# Patient Record
Sex: Female | Born: 1945 | Hispanic: No | Marital: Single | Smoking: Former smoker
Health system: Southern US, Community
[De-identification: ages and names within clinical notes are randomized; demographics above are authoritative.]

## PROBLEM LIST (undated history)

## (undated) DIAGNOSIS — H269 Unspecified cataract: Secondary | ICD-10-CM

## (undated) DIAGNOSIS — M1990 Unspecified osteoarthritis, unspecified site: Secondary | ICD-10-CM

## (undated) DIAGNOSIS — Z923 Personal history of irradiation: Secondary | ICD-10-CM

## (undated) DIAGNOSIS — I471 Supraventricular tachycardia, unspecified: Secondary | ICD-10-CM

## (undated) DIAGNOSIS — I10 Essential (primary) hypertension: Secondary | ICD-10-CM

## (undated) DIAGNOSIS — E119 Type 2 diabetes mellitus without complications: Secondary | ICD-10-CM

## (undated) DIAGNOSIS — E785 Hyperlipidemia, unspecified: Secondary | ICD-10-CM

## (undated) DIAGNOSIS — E079 Disorder of thyroid, unspecified: Secondary | ICD-10-CM

## (undated) DIAGNOSIS — K219 Gastro-esophageal reflux disease without esophagitis: Secondary | ICD-10-CM

## (undated) HISTORY — DX: Hyperlipidemia, unspecified: E78.5

## (undated) HISTORY — PX: BREAST SURGERY: SHX581

## (undated) HISTORY — PX: APPENDECTOMY: SHX54

## (undated) HISTORY — PX: KNEE SURGERY: SHX244

## (undated) HISTORY — PX: OTHER SURGICAL HISTORY: SHX169

## (undated) HISTORY — DX: Unspecified cataract: H26.9

## (undated) HISTORY — PX: EYE SURGERY: SHX253

## (undated) HISTORY — DX: Unspecified osteoarthritis, unspecified site: M19.90

## (undated) HISTORY — DX: Gastro-esophageal reflux disease without esophagitis: K21.9

---

## 2016-12-29 ENCOUNTER — Emergency Department (HOSPITAL_COMMUNITY)
Admission: EM | Admit: 2016-12-29 | Discharge: 2016-12-29 | Disposition: A | Discharge status: Home / Self Care | Payer: Medicaid Other | Attending: Emergency Medicine | Admitting: Emergency Medicine

## 2016-12-29 ENCOUNTER — Emergency Department (HOSPITAL_COMMUNITY): Payer: Medicaid Other

## 2016-12-29 ENCOUNTER — Encounter (HOSPITAL_COMMUNITY): Payer: Self-pay

## 2016-12-29 DIAGNOSIS — I471 Supraventricular tachycardia: Secondary | ICD-10-CM | POA: Diagnosis not present

## 2016-12-29 DIAGNOSIS — R000 Tachycardia, unspecified: Secondary | ICD-10-CM | POA: Diagnosis present

## 2016-12-29 HISTORY — DX: Essential (primary) hypertension: I10

## 2016-12-29 HISTORY — DX: Supraventricular tachycardia: I47.1

## 2016-12-29 HISTORY — DX: Disorder of thyroid, unspecified: E07.9

## 2016-12-29 HISTORY — DX: Supraventricular tachycardia, unspecified: I47.10

## 2016-12-29 LAB — BASIC METABOLIC PANEL
Anion gap: 11 (ref 5–15)
BUN: 13 mg/dL (ref 6–20)
CO2: 20 mmol/L — ABNORMAL LOW (ref 22–32)
Calcium: 8.8 mg/dL — ABNORMAL LOW (ref 8.9–10.3)
Chloride: 105 mmol/L (ref 101–111)
Creatinine, Ser: 0.7 mg/dL (ref 0.44–1.00)
GFR calc Af Amer: >60 mL/min (ref >60)
GFR calc non Af Amer: >60 mL/min (ref >60)
Glucose, Bld: 236 mg/dL — ABNORMAL HIGH (ref 65–99)
Potassium: 3.3 mmol/L — ABNORMAL LOW (ref 3.5–5.1)
Sodium: 136 mmol/L (ref 135–145)

## 2016-12-29 LAB — CBC
HCT: 38.4 % (ref 36.0–46.0)
Hemoglobin: 12.5 g/dL (ref 12.0–15.0)
MCH: 28.2 pg (ref 26.0–34.0)
MCHC: 32.6 g/dL (ref 30.0–36.0)
MCV: 86.7 fL (ref 78.0–100.0)
Platelets: 234 10*3/uL (ref 150–400)
RBC: 4.43 MIL/uL (ref 3.87–5.11)
RDW: 12.7 % (ref 11.5–15.5)
WBC: 8.5 10*3/uL (ref 4.0–10.5)

## 2016-12-29 LAB — TROPONIN I: Troponin I: 0.06 ng/mL (ref <0.03)

## 2016-12-29 MED ORDER — PROPRANOLOL HCL ER 60 MG PO CP24
60.0000 mg | ORAL_CAPSULE | Freq: Once | ORAL | Status: AC
  Administered 2016-12-29: 60 mg via ORAL
  Filled 2016-12-29: qty 1

## 2016-12-29 MED ORDER — PROPRANOLOL HCL ER 60 MG PO CP24: 60.0000 mg | ORAL_CAPSULE | Freq: Every day | ORAL | 0 refills | Status: DC

## 2016-12-29 MED ORDER — LEVOTHYROXINE SODIUM 50 MCG PO TABS: 50.0000 ug | ORAL_TABLET | Freq: Every day | ORAL | 0 refills | Status: DC

## 2016-12-29 MED ORDER — METOPROLOL TARTRATE 5 MG/5ML IV SOLN
5.0000 mg | Freq: Once | INTRAVENOUS | Status: AC
  Administered 2016-12-29: 5 mg via INTRAVENOUS
  Filled 2016-12-29: qty 5

## 2016-12-29 NOTE — ED Triage Notes (Signed)
Per EMS, pt had onset of central chest pressure at 1530 this afternoon. On scene pt was in SVT at 226bpm. Pt given adenosine and converted to 120-130 sinus tach. Pt states that chest pressure is less after conversion. Pt also states that she has not taken her beta blocker or thyroid meds in 2 days because of a miscommunication with her pcp. Pt in sinus tach here 125.

## 2016-12-29 NOTE — Discharge Instructions (Signed)
Return to the ER if you develop chest discomfort, fast heart rate or shortness of breath

## 2016-12-29 NOTE — ED Notes (Signed)
Patient ambulated in hallway with only stand by assist, HR remained around 115 on pulse ox and was at the same rate on cardiac monitor with only a brief moment at 127 when getting back in bed. HR quickly decreased back to 118 when movement was stopped.

## 2016-12-29 NOTE — ED Provider Notes (Signed)
[facility] DEPT Provider Note   CSN: [id number] Arrival date & time: 12/29/16  1615     History   Chief Complaint Chief Complaint  Patient presents with  . Tachycardia    HPI Bailey Walker is a 71 y.o. female.  Patient is a 71 year old female with a history of GERD, hypothyroidism and an unknown heart condition per family presenting today with palpitations, shortness of breath, feeling like throat is closing and central chest pressure that radiates to her neck. Patient states this started around 2 PM today while she was taking a shower. When EMS arrived patient was found to be in SVT. She was given adenosine with return to sinus rhythm. She states now she has only a slight chest posture no shortness of breath and feels much better. She has never had anything like this before. Speaking with her daughter patient has not been on her beta blocker for her thyroid medication for the last 3 days due to miscommunication with her doctor in the pharmacy. To be taking propranolol. Patient denies any recent illness, respiratory congestion, productive cough, fever. She denies any abdominal pain, nausea or vomiting. No recent feet swelling or urine changes.  Patient denies any unilateral calf pain or swelling. No recent long travel. No prior history of PE or DVT.   The history is provided by the patient and a relative. The history is limited by a language barrier. A language interpreter was used.    Past Medical History:  Diagnosis Date  . SVT (supraventricular tachycardia) (HCC)     There are no active problems to display for this patient.   History reviewed. No pertinent surgical history.  OB History    No data available       Home Medications    Prior to Admission medications   Not on File    Family History History reviewed. No pertinent family history.  Social History Social History  Substance Use Topics  . Smoking status: Never Smoker  . Smokeless tobacco: Not on file    . Alcohol use No     Allergies   Amoxicillin   Review of Systems Review of Systems  All other systems reviewed and are negative.    Physical Exam Updated Vital Signs BP (!) 140/99 (BP Location: Right Arm)   Pulse (!) 125   Temp 98 F (36.7 C) (Oral)   Resp 18   Ht 5\' 2"  (1.575 m)   Wt 186 lb (84.4 kg)   SpO2 98%   BMI 34.02 kg/m   Physical Exam  Constitutional: She is oriented to person, place, and time. She appears well-developed and well-nourished. No distress.  HENT:  Head: Normocephalic and atraumatic.  Mouth/Throat: Oropharynx is clear and moist.  Eyes: Conjunctivae and EOM are normal. Pupils are equal, round, and reactive to light.  Neck: Normal range of motion. Neck supple.  Cardiovascular: Regular rhythm and intact distal pulses.  Tachycardia present.   No murmur heard. Pulmonary/Chest: Effort normal and breath sounds normal. No respiratory distress. She has no wheezes. She has no rales.  Abdominal: Soft. She exhibits no distension. There is no tenderness. There is no rebound and no guarding.  Musculoskeletal: Normal range of motion. She exhibits no edema or tenderness.  No calf tenderness  Neurological: She is alert and oriented to person, place, and time.  Skin: Skin is warm and dry. Capillary refill takes less than 2 seconds. No rash noted. No erythema.  Psychiatric: She has a normal mood and affect. Her behavior  is normal.  Nursing note and vitals reviewed.    ED Treatments / Results  Labs (all labs ordered are listed, but only abnormal results are displayed) Labs Reviewed  BASIC METABOLIC PANEL - Abnormal; Notable for the following:       Result Value   Potassium 3.3 (*)    CO2 20 (*)    Glucose, Bld 236 (*)    Calcium 8.8 (*)    All other components within normal limits  TROPONIN I - Abnormal; Notable for the following:    Troponin I 0.06 (*)    All other components within normal limits  CBC    EKG  EKG  Interpretation  Date/Time:  Monday December 29 2016 16:27:46 EDT Ventricular Rate:  125 PR Interval:    QRS Duration: 69 QT Interval:  436 QTC Calculation: 622 R Axis:   40 Text Interpretation:  Sinus tachycardia Atrial premature complex Low voltage, precordial leads Borderline repolarization abnormality Prolonged QT interval No previous tracing Confirmed by Maryan Rued  MD, Loree Fee ([id number]) on 12/29/2016 4:40:08 PM       Radiology Dg Chest 2 View  Result Date: 12/29/2016 CLINICAL DATA:  Chest pain and shortness of breath for 1 day EXAM: CHEST  2 VIEW COMPARISON:  None. FINDINGS: The heart size and mediastinal contours are within normal limits. Both lungs are clear. The visualized skeletal structures are unremarkable. IMPRESSION: No active cardiopulmonary disease. Electronically Signed   By: Inez Catalina M.D.   On: 12/29/2016 17:57    Procedures Procedures (including critical care time)  Medications Ordered in ED Medications  metoprolol (LOPRESSOR) injection 5 mg (not administered)     Initial Impression / Assessment and Plan / ED Course  I have reviewed the triage vital signs and the nursing notes.  Pertinent labs & imaging results that were available during my care of the patient were reviewed by me and considered in my medical decision making (see chart for details).    Patient presenting today after an episode of SVT that was converted by adenosine with EMS. Patient currently is in sinus tachycardia between 120 to 130.  Patient has never had a history of SVT in the past however she has been out of her beta blocker for the last 3 days due to miscommunication between her PCP and pharmacy. This may explain why she has ongoing sinus tachycardia due to a rebound effect. Patient denies any recent illness, risk factors for PE or DVT. She currently is still having some mild discomfort in her chest which started but the palpitations. She has no prior history of MI or coronary artery disease that  is known. EKG shows no signs of ST elevation but does show persistent sinus tachycardia. Patient given Lopressor and will find that her long-term beta blocker and dose is. CBC, BMP, troponin, chest x-ray pending.  7:03 PM Labs with mild troponin leak which is most likely from extreme tachycardia greater than 200. Patient has absolutely no chest pain now. After taking her propranolol heart rate is less than 100. When she was ambulated in the department she had no recurrent chest pain or tachycardia. Patient has follow-up appointment with a new PCP on April 19. Discussed findings with patient and her family. She was given prescription for her propranolol and levothyroxine.  She was given strict return precautions  Final Clinical Impressions(s) / ED Diagnoses   Final diagnoses:  SVT (supraventricular tachycardia) [facility])    New Prescriptions Current Discharge Medication List  Blanchie Dessert, MD 12/29/16 [phone]

## 2016-12-29 NOTE — ED Notes (Signed)
Pt back from x-ray.

## 2016-12-29 NOTE — ED Notes (Signed)
Pt transported to xray 

## 2016-12-29 NOTE — ED Notes (Signed)
Lab called this Rn with critical troponin of 0.06

## 2017-01-08 ENCOUNTER — Encounter: Payer: Self-pay | Admitting: Family Medicine

## 2017-01-08 ENCOUNTER — Ambulatory Visit (INDEPENDENT_AMBULATORY_CARE_PROVIDER_SITE_OTHER): Payer: Medicaid Other | Admitting: Family Medicine

## 2017-01-08 VITALS — BP 140/78 | HR 77 | Temp 97.5°F | Resp 16 | Ht <= 58 in | Wt 180.0 lb

## 2017-01-08 DIAGNOSIS — I471 Supraventricular tachycardia, unspecified: Secondary | ICD-10-CM

## 2017-01-08 DIAGNOSIS — Z23 Encounter for immunization: Secondary | ICD-10-CM | POA: Diagnosis not present

## 2017-01-08 DIAGNOSIS — I10 Essential (primary) hypertension: Secondary | ICD-10-CM | POA: Diagnosis not present

## 2017-01-08 DIAGNOSIS — Z1211 Encounter for screening for malignant neoplasm of colon: Secondary | ICD-10-CM | POA: Diagnosis not present

## 2017-01-08 DIAGNOSIS — E119 Type 2 diabetes mellitus without complications: Secondary | ICD-10-CM

## 2017-01-08 DIAGNOSIS — Z1159 Encounter for screening for other viral diseases: Secondary | ICD-10-CM

## 2017-01-08 DIAGNOSIS — E039 Hypothyroidism, unspecified: Secondary | ICD-10-CM

## 2017-01-08 DIAGNOSIS — Z1231 Encounter for screening mammogram for malignant neoplasm of breast: Secondary | ICD-10-CM | POA: Diagnosis not present

## 2017-01-08 DIAGNOSIS — K219 Gastro-esophageal reflux disease without esophagitis: Secondary | ICD-10-CM | POA: Diagnosis not present

## 2017-01-08 DIAGNOSIS — Z1239 Encounter for other screening for malignant neoplasm of breast: Secondary | ICD-10-CM

## 2017-01-08 LAB — POCT GLYCOSYLATED HEMOGLOBIN (HGB A1C): Hemoglobin A1C: 7.9

## 2017-01-08 LAB — COMPLETE METABOLIC PANEL WITH GFR
ALT: 12 U/L (ref 6–29)
AST: 15 U/L (ref 10–35)
Albumin: 4 g/dL (ref 3.6–5.1)
Alkaline Phosphatase: 73 U/L (ref 33–130)
BUN: 13 mg/dL (ref 7–25)
CO2: 27 mmol/L (ref 20–31)
Calcium: 9.3 mg/dL (ref 8.6–10.4)
Chloride: 103 mmol/L (ref 98–110)
Creat: 0.74 mg/dL (ref 0.60–0.93)
GFR, Est African American: >89 mL/min (ref >=60)
GFR, Est Non African American: 81 mL/min (ref >=60)
Glucose, Bld: 167 mg/dL — ABNORMAL HIGH (ref 65–99)
Potassium: 4.7 mmol/L (ref 3.5–5.3)
Sodium: 138 mmol/L (ref 135–146)
Total Bilirubin: 0.3 mg/dL (ref 0.2–1.2)
Total Protein: 7.1 g/dL (ref 6.1–8.1)

## 2017-01-08 LAB — POCT URINALYSIS DIP (DEVICE)
Bilirubin Urine: NEGATIVE
Glucose, UA: NEGATIVE mg/dL
Hgb urine dipstick: NEGATIVE
Ketones, ur: NEGATIVE mg/dL
Nitrite: NEGATIVE
Protein, ur: NEGATIVE mg/dL
Specific Gravity, Urine: <=1.005 (ref 1.005–1.030)
Urobilinogen, UA: 0.2 mg/dL (ref 0.0–1.0)
pH: 5.5 (ref 5.0–8.0)

## 2017-01-08 LAB — TSH: TSH: 5.01 mIU/L — ABNORMAL HIGH

## 2017-01-08 MED ORDER — DEXLANSOPRAZOLE 30 MG PO CPDR: 30.0000 mg | DELAYED_RELEASE_CAPSULE | Freq: Every day | ORAL | 5 refills | Status: DC

## 2017-01-08 MED ORDER — PNEUMOCOCCAL 13-VAL CONJ VACC IM SUSP
0.5000 mL | INTRAMUSCULAR | Status: AC
  Administered 2017-01-08: 0.5 mL via INTRAMUSCULAR

## 2017-01-08 MED ORDER — LEVOTHYROXINE SODIUM 50 MCG PO TABS: 50.0000 ug | ORAL_TABLET | Freq: Every day | ORAL | 5 refills | Status: DC

## 2017-01-08 MED ORDER — METFORMIN HCL 500 MG PO TABS: 500.0000 mg | ORAL_TABLET | Freq: Two times a day (BID) | ORAL | 5 refills | Status: DC

## 2017-01-08 MED ORDER — METOPROLOL TARTRATE 37.5 MG PO TABS: 37.5000 mg | ORAL_TABLET | Freq: Two times a day (BID) | ORAL | 5 refills | Status: DC

## 2017-01-08 MED ORDER — BLOOD PRESSURE MONITOR/M CUFF MISC: 1.0000 | Freq: Two times a day (BID) | 0 refills | Status: DC

## 2017-01-08 NOTE — Progress Notes (Signed)
Subjective:    Patient ID: Bailey Walker, female    DOB: 1946-01-16, 71 y.o.   MRN: [id number]  HPI Ms. Bailey Walker Floor, a 71 year old female presents accompanied by niece to establish care. Patient primarily speaks [place] and her niece will be interpreting to assist with communication. She has been seen by [name], at [facility]. She says that she was unable to obtain medication refills and was seen in the emergency room on 12/29/2016 for SVTs. She was evaluated and treated in the emergency department and did not require admission at the time.  She has a history of hypertension and hypothyroidism. She has been taking consistently over the past several days. She says that she was out of medications for 3 days prior to emergency room visit. She does not exercise and does not follow a low fat, low sodium diet. She currently denies chest pains, palpitations, shortness of breath, lower extremity swelling, or tachypnea.   She also has a history of hypothyroidism. She has been taking medication consistently over the past week. She was recently diagnosed with hypothyroidism by Dr. [name] at [facility]gent [facility]. She was having constipation, weight gain, and fatigue prior to starting levothyroxine.  The symptoms are mild.   Past Medical History:  Diagnosis Date  . Hypertension   . SVT (supraventricular tachycardia) ([place])   . Thyroid disease    Social History   Social History  . Marital status: Single    Spouse name: N/A  . Number of children: N/A  . Years of education: N/A   Occupational History  . Not on file.   Social History Main Topics  . Smoking status: Former Smoker    Quit date: 12/29/2013  . Smokeless tobacco: Never Used  . Alcohol use No  . Drug use: No  . Sexual activity: Not on file   Other Topics Concern  . Not on file   Social History Narrative  . No narrative on file   Review of Systems  Constitutional: Positive for fatigue.  HENT: Negative.  Negative  for sinus pain and sinus pressure.   Eyes: Negative.  Negative for redness.  Respiratory: Negative.   Cardiovascular: Negative for chest pain, palpitations and leg swelling.  Gastrointestinal: Negative.   Endocrine: Positive for polyuria. Negative for cold intolerance, heat intolerance, polydipsia and polyphagia.  Musculoskeletal: Negative.   Skin: Negative.   Allergic/Immunologic: Negative.   Neurological: Negative.   Hematological: Negative.   Psychiatric/Behavioral: Negative.         Objective:   Physical Exam  Constitutional: She appears well-developed and well-nourished.  HENT:  Head: Normocephalic and atraumatic.  Right Ear: External ear normal.  Left Ear: External ear normal.  Nose: Nose normal.  Mouth/Throat: Oropharynx is clear and moist.  Eyes: Conjunctivae and EOM are normal. Pupils are equal, round, and reactive to light.  Neck: Normal range of motion. Neck supple.  Cardiovascular: Normal rate, regular rhythm, normal heart sounds and intact distal pulses.   No murmur heard. Pulmonary/Chest: Effort normal and breath sounds normal. No respiratory distress. She has no wheezes. She has no rales. She exhibits no tenderness.  Abdominal: Soft. Bowel sounds are normal.  Musculoskeletal: Normal range of motion.  Neurological: She is alert. She has normal reflexes. She displays normal reflexes. No cranial nerve deficit. Coordination normal.  Skin: Skin is warm and dry.  Psychiatric: She has a normal mood and affect. Her behavior is normal. Judgment and thought content normal.  BP (!) 150/78 Comment: manual  Pulse 77   Temp 97.5 F (36.4 C) (Oral)   Resp 16   Ht 4\' 10"  (1.473 m)   Wt 180 lb (81.6 kg)   SpO2 98%   BMI 37.62 kg/m  Assessment & Plan:  1. Essential hypertension Blood pressure is above goal on current medication regimen.  - POCT urinalysis dip (device) - metoprolol 37.5 MG TABS; Take 37.5 mg by mouth 2 (two) times daily.  Dispense: 60 tablet; Refill:  5 - Blood Pressure Monitoring (BLOOD PRESSURE MONITOR/M CUFF) MISC; 1 each by Does not apply route 2 (two) times daily.  Dispense: 1 each; Refill: 0 - Ambulatory referral to Ophthalmology - COMPLETE METABOLIC PANEL WITH GFR  2. Hypothyroidism, unspecified type - TSH - levothyroxine (SYNTHROID, LEVOTHROID) 50 MCG tablet; Take 1 tablet (50 mcg total) by mouth daily before breakfast.  Dispense: 30 tablet; Refill: 5 - HgB A1c 3. Gastroesophageal reflux disease without esophagitis - Dexlansoprazole (DEXILANT) 30 MG capsule; Take 1 capsule (30 mg total) by mouth daily.  Dispense: 30 capsule; Refill: 5  4. Need for hepatitis C screening test - Hepatitis C Antibody -  5. Breast cancer screening - MM Digital Screening; Future   6. Colon cancer screening - Ambulatory referral to Gastroenterology   7. SVT (supraventricular tachycardia) (HCC) Continue metoprolol.   8. Type 2 diabetes mellitus without complication, without long-term current use of insulin (HCC) Hemoglobin a1c is 7.9, which is consistent with type 2 diabetes mellitus.  Education materials given and discussed at length.  Will follow up in the office in 1 month.  Carbohydrate modified diet.   - metFORMIN (GLUCOPHAGE) 500 MG tablet; Take 1 tablet (500 mg total) by mouth 2 (two) times daily with a meal.  Dispense: 60 tablet; Refill: 5 - Ambulatory referral to diabetic education  9. Immunization due -pneumococcal 23  10. Language barrier to communication Primarily speaks [place]. Niece interpreted to assist with communication   RTC: Follow up in 1 month for DMII and hypertension   Keiley Levey Al Decant  MSN, FNP-C [facility] [place], [place] [id number] [phone]

## 2017-01-08 NOTE — Patient Instructions (Addendum)
Essential hypertension:   2 weeks for blood pressure check   1 month for appointment for hypertension    GERD:  Will continue Dexillant 30 mg daily   Type 2 diabetes:  Will start Metformin 500 mg with breakfast and dinner Recommend a lowfat, low carbohydrate diet divided over 5-6 small meals, increase water intake to 6-8 glasses, and 150 minutes per week of cardiovascular exercise.   Food Choices for Gastroesophageal Reflux Disease, Adult When you have gastroesophageal reflux disease (GERD), the foods you eat and your eating habits are very important. Choosing the right foods can help ease your discomfort. What guidelines do I need to follow?  Choose fruits, vegetables, whole grains, and low-fat dairy products.  Choose low-fat meat, fish, and poultry.  Limit fats such as oils, salad dressings, butter, nuts, and avocado.  Keep a food diary. This helps you identify foods that cause symptoms.  Avoid foods that cause symptoms. These may be different for everyone.  Eat small meals often instead of 3 large meals a day.  Eat your meals slowly, in a place where you are relaxed.  Limit fried foods.  Cook foods using methods other than frying.  Avoid drinking alcohol.  Avoid drinking large amounts of liquids with your meals.  Avoid bending over or lying down until 2-3 hours after eating. What foods are not recommended? These are some foods and drinks that may make your symptoms worse: Vegetables  Tomatoes. Tomato juice. Tomato and spaghetti sauce. Chili peppers. Onion and garlic. Horseradish. Fruits  Oranges, grapefruit, and lemon (fruit and juice). Meats  High-fat meats, fish, and poultry. This includes hot dogs, ribs, ham, sausage, salami, and bacon. Dairy  Whole milk and chocolate milk. Sour cream. Cream. Butter. Ice cream. Cream cheese. Drinks  Coffee and tea. Bubbly (carbonated) drinks or energy drinks. Condiments  Hot sauce. Barbecue sauce. Sweets/Desserts   Chocolate and cocoa. Donuts. Peppermint and spearmint. Fats and Oils  High-fat foods. This includes [place] fries and potato chips. Other  Vinegar. Strong spices. This includes black pepper, white pepper, red pepper, cayenne, curry powder, cloves, ginger, and chili powder. The items listed above may not be a complete list of foods and drinks to avoid. Contact your dietitian for more information.  This information is not intended to replace advice given to you by your health care provider. Make sure you discuss any questions you have with your health care provider. Document Released: 03/09/2012 Document Revised: 02/14/2016 Document Reviewed: 07/13/2013 Elsevier Interactive Patient Education  2017 [place] DASH stands for "Dietary Approaches to Stop Hypertension." The DASH eating plan is a healthy eating plan that has been shown to reduce high blood pressure (hypertension). It may also reduce your risk for type 2 diabetes, heart disease, and stroke. The DASH eating plan may also help with weight loss. What are tips for following this plan? General guidelines   Avoid eating more than 2,300 mg (milligrams) of salt (sodium) a day. If you have hypertension, you may need to reduce your sodium intake to 1,500 mg a day.  Limit alcohol intake to no more than 1 drink a day for nonpregnant women and 2 drinks a day for men. One drink equals 12 oz of beer, 5 oz of wine, or 1 oz of hard liquor.  Work with your health care provider to maintain a healthy body weight or to lose weight. Ask what an ideal weight is for you.  Get at least 30 minutes of exercise that causes  your heart to beat faster (aerobic exercise) most days of the week. Activities may include walking, swimming, or biking.  Work with your health care provider or diet and nutrition specialist (dietitian) to adjust your eating plan to your individual calorie needs. Reading food labels   Check food labels for the amount  of sodium per serving. Choose foods with less than 5 percent of the Daily Value of sodium. Generally, foods with less than 300 mg of sodium per serving fit into this eating plan.  To find whole grains, look for the word "whole" as the first word in the ingredient list. Shopping   Buy products labeled as "low-sodium" or "no salt added."  Buy fresh foods. Avoid canned foods and premade or frozen meals. Cooking   Avoid adding salt when cooking. Use salt-free seasonings or herbs instead of table salt or sea salt. Check with your health care provider or pharmacist before using salt substitutes.  Do not fry foods. Cook foods using healthy methods such as baking, boiling, grilling, and broiling instead.  Cook with heart-healthy oils, such as olive, canola, soybean, or sunflower oil. Meal planning    Eat a balanced diet that includes:  5 or more servings of fruits and vegetables each day. At each meal, try to fill half of your plate with fruits and vegetables.  Up to 6-8 servings of whole grains each day.  Less than 6 oz of lean meat, poultry, or fish each day. A 3-oz serving of meat is about the same size as a deck of cards. One egg equals 1 oz.  2 servings of low-fat dairy each day.  A serving of nuts, seeds, or beans 5 times each week.  Heart-healthy fats. Healthy fats called Omega-3 fatty acids are found in foods such as flaxseeds and coldwater fish, like sardines, salmon, and mackerel.  Limit how much you eat of the following:  Canned or prepackaged foods.  Food that is high in trans fat, such as fried foods.  Food that is high in saturated fat, such as fatty meat.  Sweets, desserts, sugary drinks, and other foods with added sugar.  Full-fat dairy products.  Do not salt foods before eating.  Try to eat at least 2 vegetarian meals each week.  Eat more home-cooked food and less restaurant, buffet, and fast food.  When eating at a restaurant, ask that your food be prepared  with less salt or no salt, if possible. What foods are recommended? The items listed may not be a complete list. Talk with your dietitian about what dietary choices are best for you. Grains  Whole-grain or whole-wheat bread. Whole-grain or whole-wheat pasta. Brown rice. [place]. Bulgur. Whole-grain and low-sodium cereals. Pita bread. Low-fat, low-sodium crackers. Whole-wheat flour tortillas. Vegetables  Fresh or frozen vegetables (raw, steamed, roasted, or grilled). Low-sodium or reduced-sodium tomato and vegetable juice. Low-sodium or reduced-sodium tomato sauce and tomato paste. Low-sodium or reduced-sodium canned vegetables. Fruits  All fresh, dried, or frozen fruit. Canned fruit in natural juice (without added sugar). Meat and other protein foods  Skinless chicken or [place]. Ground chicken or [place]. Pork with fat trimmed off. Fish and seafood. Egg whites. Dried beans, peas, or lentils. Unsalted nuts, nut butters, and seeds. Unsalted canned beans. Lean cuts of beef with fat trimmed off. Low-sodium, lean deli meat. Dairy  Low-fat (1%) or fat-free (skim) milk. Fat-free, low-fat, or reduced-fat cheeses. Nonfat, low-sodium ricotta or cottage cheese. Low-fat or nonfat yogurt. Low-fat, low-sodium cheese. Fats and oils  Soft margarine  without trans fats. Vegetable oil. Low-fat, reduced-fat, or light mayonnaise and salad dressings (reduced-sodium). Canola, safflower, olive, soybean, and sunflower oils. Avocado. Seasoning and other foods  Herbs. Spices. Seasoning mixes without salt. Unsalted popcorn and pretzels. Fat-free sweets. What foods are not recommended? The items listed may not be a complete list. Talk with your dietitian about what dietary choices are best for you. Grains  Baked goods made with fat, such as croissants, muffins, or some breads. Dry pasta or rice meal packs. Vegetables  Creamed or fried vegetables. Vegetables in a cheese sauce. Regular canned vegetables (not  low-sodium or reduced-sodium). Regular canned tomato sauce and paste (not low-sodium or reduced-sodium). Regular tomato and vegetable juice (not low-sodium or reduced-sodium). Angie Fava. Olives. Fruits  Canned fruit in a light or heavy syrup. Fried fruit. Fruit in cream or butter sauce. Meat and other protein foods  Fatty cuts of meat. Ribs. Fried meat. Berniece Salines. Sausage. Bologna and other processed lunch meats. Salami. Fatback. Hotdogs. Bratwurst. Salted nuts and seeds. Canned beans with added salt. Canned or smoked fish. Whole eggs or egg yolks. Chicken or [place] with skin. Dairy  Whole or 2% milk, cream, and half-and-half. Whole or full-fat cream cheese. Whole-fat or sweetened yogurt. Full-fat cheese. Nondairy creamers. Whipped toppings. Processed cheese and cheese spreads. Fats and oils  Butter. Stick margarine. Lard. Shortening. Ghee. Bacon fat. Tropical oils, such as coconut, palm kernel, or palm oil. Seasoning and other foods  Salted popcorn and pretzels. Onion salt, garlic salt, seasoned salt, table salt, and sea salt. Worcestershire sauce. Tartar sauce. Barbecue sauce. Teriyaki sauce. Soy sauce, including reduced-sodium. Steak sauce. Canned and packaged gravies. Fish sauce. Oyster sauce. Cocktail sauce. Horseradish that you find on the shelf. Ketchup. Mustard. Meat flavorings and tenderizers. Bouillon cubes. Hot sauce and Tabasco sauce. Premade or packaged marinades. Premade or packaged taco seasonings. Relishes. Regular salad dressings. Where to find more information:  National Heart, Lung, and [place]: https://wilson-eaton.com/  American Heart Association: www.heart.org Summary  The DASH eating plan is a healthy eating plan that has been shown to reduce high blood pressure (hypertension). It may also reduce your risk for type 2 diabetes, heart disease, and stroke.  With the DASH eating plan, you should limit salt (sodium) intake to 2,300 mg a day. If you have hypertension, you may need to  reduce your sodium intake to 1,500 mg a day.  When on the DASH eating plan, aim to eat more fresh fruits and vegetables, whole grains, lean proteins, low-fat dairy, and heart-healthy fats.  Work with your health care provider or diet and nutrition specialist (dietitian) to adjust your eating plan to your individual calorie needs. This information is not intended to replace advice given to you by your health care provider. Make sure you discuss any questions you have with your health care provider. Document Released: 08/28/2011 Document Revised: 09/01/2016 Document Reviewed: 09/01/2016 Elsevier Interactive Patient Education  2017 [place].  Type 2 Diabetes Mellitus, Diagnosis, Adult Type 2 diabetes (type 2 diabetes mellitus) is a long-term (chronic) disease. It may be caused by one or both of these problems:  Your body does not make enough of a hormone called insulin.  Your body does not react in a normal way to insulin that it makes. Insulin lets sugars (glucose) go into cells in the body. This gives you energy. If you have type 2 diabetes, sugars cannot get into cells. This causes high blood sugar (hyperglycemia). Your doctor will set treatment goals for you. Generally, you should have  these blood sugar levels:  Before meals (preprandial): 80-130 mg/dL (4.4-7.2 mmol/L).  After meals (postprandial): below 180 mg/dL (10 mmol/L).  A1c (hemoglobin A1c) level: less than 7%. Follow these instructions at home: Questions to Ask Your Doctor   You may want to ask these questions:  Do I need to meet with a diabetes educator?  Where can I find a support group for people with diabetes?  What equipment will I need to care for myself at home?  What diabetes medicines do I need? When should I take them?  How often do I need to check my blood sugar?  What number can I call if I have questions?  When is my next doctor's visit? General instructions   Take over-the-counter and  prescription medicines only as told by your doctor.  Keep all follow-up visits as told by your doctor. This is important. Contact a doctor if:  Your blood sugar is at or above 240 mg/dL (13.3 mmol/L) for 2 days in a row.  You have been sick or have had a fever for 2 days or more and you are not getting better.  You have any of these problems for more than 6 hours:  You cannot eat or drink.  You feel sick to your stomach (nauseous).  You throw up (vomit).  You have watery poop (diarrhea). Get help right away if:  Your blood sugar is lower than 54 mg/dL (3 mmol/L).  You get confused.  You have trouble:  Thinking clearly.  Breathing.  You have moderate or large ketone levels in your pee (urine). This information is not intended to replace advice given to you by your health care provider. Make sure you discuss any questions you have with your health care provider. Document Released: 06/17/2008 Document Revised: 02/14/2016 Document Reviewed: 10/12/2015 Elsevier Interactive Patient Education  2017 Else[employer] Inc. Diabetes Mellitus and Food It is important for you to manage your blood sugar (glucose) level. Your blood glucose level can be greatly affected by what you eat. Eating healthier foods in the appropriate amounts throughout the day at about the same time each day will help you control your blood glucose level. It can also help slow or prevent worsening of your diabetes mellitus. Healthy eating may even help you improve the level of your blood pressure and reach or maintain a healthy weight. General recommendations for healthful eating and cooking habits include:  Eating meals and snacks regularly. Avoid going long periods of time without eating to lose weight.  Eating a diet that consists mainly of plant-based foods, such as fruits, vegetables, nuts, legumes, and whole grains.  Using low-heat cooking methods, such as baking, instead of high-heat cooking methods, such as deep  frying. Work with your dietitian to make sure you understand how to use the Nutrition Facts information on food labels. How can food affect me? Carbohydrates  Carbohydrates affect your blood glucose level more than any other type of food. Your dietitian will help you determine how many carbohydrates to eat at each meal and teach you how to count carbohydrates. Counting carbohydrates is important to keep your blood glucose at a healthy level, especially if you are using insulin or taking certain medicines for diabetes mellitus. Alcohol  Alcohol can cause sudden decreases in blood glucose (hypoglycemia), especially if you use insulin or take certain medicines for diabetes mellitus. Hypoglycemia can be a life-threatening condition. Symptoms of hypoglycemia (sleepiness, dizziness, and disorientation) are similar to symptoms of having too much alcohol. If your health  care provider has given you approval to drink alcohol, do so in moderation and use the following guidelines:  Women should not have more than one drink per day, and men should not have more than two drinks per day. One drink is equal to:  12 oz of beer.  5 oz of wine.  1 oz of hard liquor.  Do not drink on an empty stomach.  Keep yourself hydrated. Have water, diet soda, or unsweetened iced tea.  Regular soda, juice, and other mixers might contain a lot of carbohydrates and should be counted. What foods are not recommended? As you make food choices, it is important to remember that all foods are not the same. Some foods have fewer nutrients per serving than other foods, even though they might have the same number of calories or carbohydrates. It is difficult to get your body what it needs when you eat foods with fewer nutrients. Examples of foods that you should avoid that are high in calories and carbohydrates but low in nutrients include:  Trans fats (most processed foods list trans fats on the Nutrition Facts label).  Regular  soda.  Juice.  Candy.  Sweets, such as cake, pie, doughnuts, and cookies.  Fried foods. What foods can I eat? Eat nutrient-rich foods, which will nourish your body and keep you healthy. The food you should eat also will depend on several factors, including:  The calories you need.  The medicines you take.  Your weight.  Your blood glucose level.  Your blood pressure level.  Your cholesterol level. You should eat a variety of foods, including:  Protein.  Lean cuts of meat.  Proteins low in saturated fats, such as fish, egg whites, and beans. Avoid processed meats.  Fruits and vegetables.  Fruits and vegetables that may help control blood glucose levels, such as apples, mangoes, and yams.  Dairy products.  Choose fat-free or low-fat dairy products, such as milk, yogurt, and cheese.  Grains, bread, pasta, and rice.  Choose whole grain products, such as multigrain bread, whole oats, and brown rice. These foods may help control blood pressure.  Fats.  Foods containing healthful fats, such as nuts, avocado, olive oil, canola oil, and fish. Does everyone with diabetes mellitus have the same meal plan? Because every person with diabetes mellitus is different, there is not one meal plan that works for everyone. It is very important that you meet with a dietitian who will help you create a meal plan that is just right for you. This information is not intended to replace advice given to you by your health care provider. Make sure you discuss any questions you have with your health care provider. Document Released: 06/05/2005 Document Revised: 02/14/2016 Document Reviewed: 08/05/2013 Elsevier Interactive Patient Education  2017 [employer].

## 2017-01-09 LAB — HEPATITIS C ANTIBODY: HCV Ab: NEGATIVE

## 2017-01-11 DIAGNOSIS — I10 Essential (primary) hypertension: Secondary | ICD-10-CM | POA: Insufficient documentation

## 2017-01-12 ENCOUNTER — Telehealth: Payer: Self-pay

## 2017-01-12 NOTE — Telephone Encounter (Signed)
-----   Message from Dorena Dew, [place] sent at 01/12/2017  4:06 PM EDT ----- Regarding: lab resutls Please inform patient that TSH was mildly elevated. Will continue levothyroxie 50 mcg daily, 30 minutes to 1 hour prior to breakfast.   Thanks ----- Message ----- From: Adelina Mings, LPN Sent: [id number]  11:12 AM To: Dorena Dew, FNP

## 2017-01-12 NOTE — Telephone Encounter (Signed)
I have called and left a message advising of mildly elevated thyroid and to continue to take levothyroxine 38mcg daily 30 minutes to an hour before breakfast. Asked that patient keep appointment scheduled in May and call back if any questions. Thanks!

## 2017-01-13 ENCOUNTER — Encounter: Payer: Self-pay | Admitting: Gastroenterology

## 2017-01-26 ENCOUNTER — Telehealth: Payer: Self-pay

## 2017-01-26 NOTE — Telephone Encounter (Signed)
Called into the [facility] on [address]. Pharmacy states that patient will have to pay cash because they don't accept [place] medicaid.

## 2017-01-29 ENCOUNTER — Other Ambulatory Visit: Payer: Self-pay

## 2017-01-29 DIAGNOSIS — I10 Essential (primary) hypertension: Secondary | ICD-10-CM

## 2017-01-29 DIAGNOSIS — K219 Gastro-esophageal reflux disease without esophagitis: Secondary | ICD-10-CM

## 2017-01-29 MED ORDER — DEXLANSOPRAZOLE 30 MG PO CPDR: 30.0000 mg | DELAYED_RELEASE_CAPSULE | Freq: Every day | ORAL | 0 refills | Status: DC

## 2017-01-29 MED ORDER — METOPROLOL TARTRATE 37.5 MG PO TABS: 37.5000 mg | ORAL_TABLET | Freq: Two times a day (BID) | ORAL | 0 refills | Status: DC

## 2017-01-29 NOTE — Telephone Encounter (Signed)
[place] and needed her medication for metoprolol and dexilant. [facility] [place] [facility] fill. Thanks!

## 2017-02-03 ENCOUNTER — Emergency Department (HOSPITAL_COMMUNITY): Payer: Medicaid Other

## 2017-02-03 ENCOUNTER — Emergency Department (HOSPITAL_COMMUNITY)
Admission: EM | Admit: 2017-02-03 | Discharge: 2017-02-03 | Disposition: A | Discharge status: Home / Self Care | Payer: Medicaid Other | Attending: Emergency Medicine | Admitting: Emergency Medicine

## 2017-02-03 ENCOUNTER — Encounter (HOSPITAL_COMMUNITY): Payer: Self-pay | Admitting: Nurse Practitioner

## 2017-02-03 DIAGNOSIS — S0083XA Contusion of other part of head, initial encounter: Secondary | ICD-10-CM | POA: Insufficient documentation

## 2017-02-03 DIAGNOSIS — S52592A Other fractures of lower end of left radius, initial encounter for closed fracture: Secondary | ICD-10-CM | POA: Diagnosis not present

## 2017-02-03 DIAGNOSIS — Y999 Unspecified external cause status: Secondary | ICD-10-CM | POA: Diagnosis not present

## 2017-02-03 DIAGNOSIS — Y9289 Other specified places as the place of occurrence of the external cause: Secondary | ICD-10-CM | POA: Diagnosis not present

## 2017-02-03 DIAGNOSIS — S0990XA Unspecified injury of head, initial encounter: Secondary | ICD-10-CM | POA: Diagnosis not present

## 2017-02-03 DIAGNOSIS — E119 Type 2 diabetes mellitus without complications: Secondary | ICD-10-CM | POA: Diagnosis not present

## 2017-02-03 DIAGNOSIS — Z79899 Other long term (current) drug therapy: Secondary | ICD-10-CM | POA: Insufficient documentation

## 2017-02-03 DIAGNOSIS — E039 Hypothyroidism, unspecified: Secondary | ICD-10-CM | POA: Insufficient documentation

## 2017-02-03 DIAGNOSIS — Z87891 Personal history of nicotine dependence: Secondary | ICD-10-CM | POA: Diagnosis not present

## 2017-02-03 DIAGNOSIS — W0110XA Fall on same level from slipping, tripping and stumbling with subsequent striking against unspecified object, initial encounter: Secondary | ICD-10-CM | POA: Insufficient documentation

## 2017-02-03 DIAGNOSIS — I10 Essential (primary) hypertension: Secondary | ICD-10-CM | POA: Insufficient documentation

## 2017-02-03 DIAGNOSIS — S6992XA Unspecified injury of left wrist, hand and finger(s), initial encounter: Secondary | ICD-10-CM | POA: Diagnosis present

## 2017-02-03 DIAGNOSIS — Y9389 Activity, other specified: Secondary | ICD-10-CM | POA: Diagnosis not present

## 2017-02-03 DIAGNOSIS — W19XXXA Unspecified fall, initial encounter: Secondary | ICD-10-CM

## 2017-02-03 DIAGNOSIS — Z7984 Long term (current) use of oral hypoglycemic drugs: Secondary | ICD-10-CM | POA: Insufficient documentation

## 2017-02-03 DIAGNOSIS — S52501A Unspecified fracture of the lower end of right radius, initial encounter for closed fracture: Secondary | ICD-10-CM

## 2017-02-03 HISTORY — DX: Type 2 diabetes mellitus without complications: E11.9

## 2017-02-03 MED ORDER — TETANUS-DIPHTH-ACELL PERTUSSIS 5-2.5-18.5 LF-MCG/0.5 IM SUSP
0.5000 mL | Freq: Once | INTRAMUSCULAR | Status: AC
  Administered 2017-02-03: 0.5 mL via INTRAMUSCULAR
  Filled 2017-02-03: qty 0.5

## 2017-02-03 MED ORDER — IBUPROFEN 400 MG PO TABS
400.0000 mg | ORAL_TABLET | Freq: Once | ORAL | Status: AC
  Administered 2017-02-03: 400 mg via ORAL
  Filled 2017-02-03: qty 1

## 2017-02-03 MED ORDER — OXYCODONE-ACETAMINOPHEN 5-325 MG PO TABS: 1.0000 | ORAL_TABLET | ORAL | 0 refills | Status: DC | PRN

## 2017-02-03 MED ORDER — OXYCODONE-ACETAMINOPHEN 5-325 MG PO TABS
1.0000 | ORAL_TABLET | Freq: Once | ORAL | Status: AC
  Administered 2017-02-03: 1 via ORAL
  Filled 2017-02-03: qty 1

## 2017-02-03 NOTE — Progress Notes (Signed)
Orthopedic Tech Progress Note Patient Details:  Bailey Walker 1946-04-08 [id number]  Ortho Devices Type of Ortho Device: Post (short arm) splint, Arm sling Ortho Device/Splint Location: Pt did not speak english but Niece at bedside was able to translate.  applied short arm splint to pt left arm/wrist.  pt tolerated well.  provided arm sling for support with left arm.   Ortho Device/Splint Interventions: Application, Adjustment   Kristopher Oppenheim 02/03/2017, 8:55 PM

## 2017-02-03 NOTE — ED Provider Notes (Signed)
[facility] DEPT Provider Note   CSN: [id number] Arrival date & time: 02/03/17  1453   By signing my name below, I, Soijett Blue, attest that this documentation has been prepared under the direction and in the presence of Virgel Manifold, MD. Electronically Signed: Soijett Blue, ED Scribe. 02/03/17. 6:35 PM.  History   Chief Complaint Chief Complaint  Patient presents with  . Fall    HPI Bailey Walker is a 71 y.o. female with a PMHx of DM, HTN, who presents to the Emergency Department complaining of a fall onset 11 AM this morning. Pt reports associated abrasions to left sided face, bilateral ankle swelling, left wrist pain, left knee swelling, and right foot pain. Pt has not tried any medications for the relief of her symptoms. Family reports that the pt tripped in a hole causing her to fall forward and strike her left sided face on the concrete. Family notes that the pt was out of town on a greyhound bus when the incident occurred and EMS came out and evaluated the patient. Per pt chart review, her last tetanus vaccination was on 03/07/2009. Pt denies confusion, LOC, neck pain, back pain, vision change, hip pain, and any other symptoms. Family denies the pt taking anticoagulant medications.     The history is provided by the patient and a relative. A language interpreter was used (Family; Education officer, community).    Past Medical History:  Diagnosis Date  . Diabetes mellitus without complication ([place])   . Hypertension   . SVT (supraventricular tachycardia) ([place])   . Thyroid disease     Patient Active Problem List   Diagnosis Date Noted  . Essential hypertension 01/11/2017  . Hypothyroidism 01/08/2017  . SVT (supraventricular tachycardia) ([place]) 01/08/2017    Past Surgical History:  Procedure Laterality Date  . APPENDECTOMY    . KNEE SURGERY     left knee    OB History    No data available       Home Medications    Prior to Admission medications   Medication Sig Start Date End  Date Taking? Authorizing Provider  Blood Pressure Monitoring (BLOOD PRESSURE MONITOR/M CUFF) MISC 1 each by Does not apply route 2 (two) times daily. 01/08/17   Dorena Dew, FNP  Dexlansoprazole (DEXILANT) 30 MG capsule Take 1 capsule (30 mg total) by mouth daily. 01/29/17   Dorena Dew, FNP  guaiFENesin-dextromethorphan (ROBITUSSIN DM) 100-10 MG/5ML syrup Take 5 mLs by mouth every 4 (four) hours as needed for cough.    [provider]  levothyroxine (SYNTHROID, LEVOTHROID) 50 MCG tablet Take 1 tablet (50 mcg total) by mouth daily before breakfast. 01/08/17   Dorena Dew, FNP  metFORMIN (GLUCOPHAGE) 500 MG tablet Take 1 tablet (500 mg total) by mouth 2 (two) times daily with a meal. 01/08/17   Dorena Dew, FNP  Metoprolol Tartrate 37.5 MG TABS Take 37.5 mg by mouth 2 (two) times daily. 01/29/17   Dorena Dew, FNP    Family History History reviewed. No pertinent family history.  Social History Social History  Substance Use Topics  . Smoking status: Former Smoker    Quit date: 12/29/2013  . Smokeless tobacco: Never Used  . Alcohol use No     Allergies   Ampicillin   Review of Systems Review of Systems  Eyes: Negative for visual disturbance.  Musculoskeletal: Positive for arthralgias (left wrist and right foot) and joint swelling (left knee and bilateral ankles). Negative for back pain and neck pain.  Skin: Positive for wound (abrasions to left sided face). Negative for color change.  Neurological: Negative for syncope.  Psychiatric/Behavioral: Negative for confusion.  All other systems reviewed and are negative.    Physical Exam Updated Vital Signs BP (!) 154/81   Pulse 90   Temp 97.8 F (36.6 C) (Oral)   Resp 17   SpO2 97%   Physical Exam  Constitutional: She is oriented to person, place, and time. She appears well-developed and well-nourished.  HENT:  Head: Normocephalic. Head is with abrasion.  Right Ear: External ear normal.  Left  Ear: External ear normal.  Nose: Nose normal.  Abrasion to left temporal region. Left periorbital ecchymosis.  Eyes: Right eye exhibits no discharge. Left eye exhibits no discharge.  Cardiovascular: Normal rate, regular rhythm and normal heart sounds.  Exam reveals no gallop and no friction rub.   No murmur heard. Pulmonary/Chest: Effort normal and breath sounds normal. No respiratory distress. She has no wheezes. She has no rales.  Abdominal: Soft. There is no tenderness.  Musculoskeletal:       Left wrist: She exhibits tenderness and swelling.       Right ankle: She exhibits swelling. No tenderness.       Left ankle: She exhibits swelling. No tenderness.       Cervical back: Normal.       Thoracic back: Normal.       Lumbar back: Normal.  No midline spinal tenderness. Tenderness, ecchymosis, and swelling to left wrist, this is a closed injury. NVI. 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 Mild swelling to bilateral ankles without bony tenderness.   Neurological: She is alert and oriented to person, place, and time.  Skin: Skin is warm and dry.  Nursing note and vitals reviewed.    ED Treatments / Results  DIAGNOSTIC STUDIES: Oxygen Saturation is 97% on RA, nl by my interpretation.    COORDINATION OF CARE: 6:26 PM Discussed treatment plan with pt at bedside which includes update tetanus vaccination, CT head, left wrist xray, and pt agreed to plan.   Labs (all labs ordered are listed, but only abnormal results are displayed) Labs Reviewed - No data to display  EKG  EKG Interpretation None       Radiology Dg Wrist Complete Left  Result Date: 02/03/2017 CLINICAL DATA:  Fall on outstretched hand pain EXAM: LEFT WRIST - COMPLETE 3+ VIEW COMPARISON:  None. FINDINGS: Moderate-to-marked arthritis at the first [facility] joint. Slightly impacted fracture of the distal radius, dorsal cortex, best seen on the lateral  view. Intra-articular extension is not definitely demonstrated on the views submitted. Marked soft tissue swelling. IMPRESSION: Nondisplaced distal radius fracture Electronically Signed   By: Donavan Foil M.D.   On: 02/03/2017 19:23   Ct Head Wo Contrast  Result Date: 02/03/2017 CLINICAL DATA:  Pain after fall. EXAM: CT HEAD WITHOUT CONTRAST TECHNIQUE: Contiguous axial images were obtained from the base of the skull through the vertex without intravenous contrast. COMPARISON:  None. FINDINGS: Brain: No subdural, epidural, or subarachnoid hemorrhage is identified on today's study. The cerebellum, brainstem, and basal cisterns are within normal limits. Ventricles and sulci are prominent, likely due to age related atrophy. Scattered white matter changes are identified, particularly in the corona radiata. No acute cortical ischemia or infarct is seen. No mass, mass effect, or midline shift. Vascular: No hyperdense vessel or unexpected calcification. Skull: Normal. Negative for fracture or focal lesion. Sinuses/Orbits: No acute finding.  Other: Soft tissue swelling in the left periorbital region IMPRESSION: 1. No acute intracranial abnormality identified. Electronically Signed   By: Dorise Bullion III M.D   On: 02/03/2017 19:19    Procedures Procedures (including critical care time)  Medications Ordered in ED Medications - No data to display   Initial Impression / Assessment and Plan / ED Course  I have reviewed the triage vital signs and the nursing notes.  Pertinent labs & imaging results that were available during my care of the patient were reviewed by me and considered in my medical decision making (see chart for details).     70yF s/p fall. Closed distal radius fx. NVI. Splint. PRN pain meds. Hand FU.   Final Clinical Impressions(s) / ED Diagnoses   Final diagnoses:  Fall, initial encounter  Closed fracture of distal end of right radius, unspecified fracture morphology, initial encounter     New Prescriptions New Prescriptions   No medications on file   I personally preformed the services scribed in my presence. The recorded information has been reviewed is accurate. Virgel Manifold, MD.     Virgel Manifold, MD 02/16/17 [phone]

## 2017-02-03 NOTE — ED Triage Notes (Addendum)
Pt presents with c/o fall. She tripped over an object in floor on a bus trip today. Paramedics came to the scene and dressed her wounds and she returned home to come to local ED. She presents with abrasions to L forehead, eye, nose, L hand, L knee and says that she is having pain all over her body. She did not lose consciousness. She speaks arabic and french

## 2017-02-03 NOTE — ED Notes (Signed)
Ortho aware of need for short arm splint.

## 2017-02-10 ENCOUNTER — Ambulatory Visit (INDEPENDENT_AMBULATORY_CARE_PROVIDER_SITE_OTHER): Payer: Medicaid Other | Admitting: Family Medicine

## 2017-02-10 ENCOUNTER — Encounter: Payer: Self-pay | Admitting: Family Medicine

## 2017-02-10 ENCOUNTER — Ambulatory Visit: Payer: Medicaid Other | Admitting: Gastroenterology

## 2017-02-10 VITALS — BP 148/84 | HR 84 | Temp 98.2°F | Resp 18 | Ht <= 58 in | Wt 181.0 lb

## 2017-02-10 DIAGNOSIS — S52592D Other fractures of lower end of left radius, subsequent encounter for closed fracture with routine healing: Secondary | ICD-10-CM

## 2017-02-10 DIAGNOSIS — E039 Hypothyroidism, unspecified: Secondary | ICD-10-CM | POA: Diagnosis not present

## 2017-02-10 DIAGNOSIS — Z9189 Other specified personal risk factors, not elsewhere classified: Secondary | ICD-10-CM

## 2017-02-10 DIAGNOSIS — E559 Vitamin D deficiency, unspecified: Secondary | ICD-10-CM | POA: Diagnosis not present

## 2017-02-10 DIAGNOSIS — E119 Type 2 diabetes mellitus without complications: Secondary | ICD-10-CM

## 2017-02-10 DIAGNOSIS — I10 Essential (primary) hypertension: Secondary | ICD-10-CM | POA: Diagnosis not present

## 2017-02-10 LAB — POCT URINALYSIS DIP (DEVICE)
Bilirubin Urine: NEGATIVE
Glucose, UA: NEGATIVE mg/dL
Hgb urine dipstick: NEGATIVE
Ketones, ur: NEGATIVE mg/dL
Leukocytes, UA: NEGATIVE
Nitrite: NEGATIVE
Protein, ur: 30 mg/dL — AB
Specific Gravity, Urine: 1.01 (ref 1.005–1.030)
Urobilinogen, UA: 0.2 mg/dL (ref 0.0–1.0)
pH: 6 (ref 5.0–8.0)

## 2017-02-10 LAB — POCT GLYCOSYLATED HEMOGLOBIN (HGB A1C): Hemoglobin A1C: 7.8

## 2017-02-10 NOTE — Patient Instructions (Addendum)
Type 2 diabetes mellitus:  Continue Metformin 500 mg twice daily (Cut tablet in half to assist with swallowing)   Hypertension:  Will check blood pressures twice daily at home Take medication consistently   Follow up in 3 months

## 2017-02-10 NOTE — Progress Notes (Signed)
Subjective:    Patient ID: Bailey Walker, female    DOB: 1946/08/17, 71 y.o.   MRN: [id number]  HPI Ms. Bailey Walker Floor, a 71 year old female presents accompanied by niece for a 1 month follow up of chronic conditions. Patient primarily speaks Aramaic and her niece will be interpreting to assist with communication. Bailey Walker established care 1 month ago. She was also evaluated in the emergency department on 12/29/2016. She had SVTs. She maintains that she has been taking Metoprolol consistently. She denies heart palpitations, dyspnea, chest pressure or swelling to lower extremities.  She was evaluated in the emergency department on 02/03/2017. She says that she tripped in a hole causing her to fall on concrete. Her niece says that she was out of town at a greyhound bus stop when the incident occurred. She fell on the left side and tried to stop fall with left arm. She sustained a nondisplaced distal radius fracture to left arm. Her family state states that she is scheduled to follow up with orthopedic specialists. She has a cast to left forearm. She denies pain, numbness,or  tingling  Bailey Walker was diagnosed with type 2 diabetes mellitus 1 month ago. Hemoglobin a1c was 7.9. Patient was started on Metformin 500 mg BID and a carbohydrate modified diet. She says that she has not been taking medication consistently due to the size of the pill. She is having a difficult time swallowing Metformin. She denies fatigue, polydipsia, polyphagia, or polyuria.  Past Medical History:  Diagnosis Date  . Diabetes mellitus without complication ([place])   . Hypertension   . SVT (supraventricular tachycardia) ([place])   . Thyroid disease    Social History   Social History  . Marital status: Single    Spouse name: N/A  . Number of children: N/A  . Years of education: N/A   Occupational History  . Not on file.   Social History Main Topics  . Smoking status: Former Smoker    Quit date: 12/29/2013  . Smokeless tobacco:  Never Used  . Alcohol use No  . Drug use: No  . Sexual activity: Not on file   Other Topics Concern  . Not on file   Social History Narrative  . No narrative on file   Review of Systems  Constitutional: Positive for fatigue and unexpected weight change.  HENT: Negative.  Negative for sinus pain and sinus pressure.   Eyes: Negative.  Negative for redness.  Respiratory: Negative.   Cardiovascular: Negative for chest pain, palpitations and leg swelling.  Gastrointestinal: Negative.   Endocrine: Positive for polyuria. Negative for cold intolerance, heat intolerance, polydipsia and polyphagia.  Musculoskeletal: Negative.   Skin: Negative.   Allergic/Immunologic: Negative.   Neurological: Negative.   Hematological: Negative.   Psychiatric/Behavioral: Negative.         Objective:   Physical Exam  Constitutional: She appears well-developed and well-nourished.  HENT:  Head: Normocephalic and atraumatic.  Right Ear: External ear normal.  Left Ear: External ear normal.  Nose: Nose normal.  Mouth/Throat: Oropharynx is clear and moist.  Eyes: Conjunctivae and EOM are normal. Pupils are equal, round, and reactive to light.  Neck: Normal range of motion. Neck supple.  Cardiovascular: Normal rate, regular rhythm, normal heart sounds and intact distal pulses.   No murmur heard. Pulmonary/Chest: Effort normal and breath sounds normal. No respiratory distress. She has no wheezes. She has no rales. She exhibits no tenderness.  Abdominal: Soft. Bowel sounds are normal.  Musculoskeletal: Normal range  of motion.  Neurological: She is alert. She has normal reflexes. No cranial nerve deficit. Coordination normal.  Skin: Skin is warm and dry.  Psychiatric: She has a normal mood and affect. Her behavior is normal. Judgment and thought content normal.     BP (!) 148/84 (BP Location: Right Arm, Patient Position: Sitting, Cuff Size: Normal) Comment: manual  Pulse 84   Temp 98.2 F (36.8 C)  (Oral)   Resp 18   Ht 4\' 10"  (1.473 m)   Wt 181 lb (82.1 kg)   SpO2 98%   BMI 37.83 kg/m  Assessment & Plan:  1. Essential hypertension Blood pressure is above goal on current medication regimen.  - Microalbumin/Creatinine Ratio, Urine  2. Hypothyroidism, unspecified type - TSH  3. Type 2 diabetes mellitus without complication, without long-term current use of insulin (HCC)  - Microalbumin/Creatinine Ratio, Urine - HgB A1c  4. At high risk for osteoporosis - DG Bone Density; Future  5. Vitamin D deficiency - Vitamin D, 25-hydroxy  6. Other closed fracture of distal end of left radius with routine healing, subsequent encounter Left arm cast. Follow up with orthopedic services for further workup and evaluation    RTC: 1 month for chronic conditions   [place]  MSN, FNP-C [place], [place] [phone]

## 2017-02-11 LAB — MICROALBUMIN / CREATININE URINE RATIO
Creatinine, Urine: 53 mg/dL (ref 20–320)
Microalb Creat Ratio: 17 mcg/mg creat (ref <30)
Microalb, Ur: 0.9 mg/dL

## 2017-02-11 LAB — TSH: TSH: 4.74 mIU/L — ABNORMAL HIGH

## 2017-02-11 LAB — VITAMIN D 25 HYDROXY (VIT D DEFICIENCY, FRACTURES): Vit D, 25-Hydroxy: 8 ng/mL — ABNORMAL LOW (ref 30–100)

## 2017-02-12 ENCOUNTER — Ambulatory Visit: Payer: Medicaid Other | Admitting: Registered"

## 2017-02-13 ENCOUNTER — Other Ambulatory Visit: Payer: Self-pay | Admitting: Family Medicine

## 2017-02-13 DIAGNOSIS — E559 Vitamin D deficiency, unspecified: Secondary | ICD-10-CM

## 2017-02-13 DIAGNOSIS — E039 Hypothyroidism, unspecified: Secondary | ICD-10-CM

## 2017-02-13 MED ORDER — LEVOTHYROXINE SODIUM 75 MCG PO TABS: 75.0000 ug | ORAL_TABLET | Freq: Every day | ORAL | 1 refills | Status: DC

## 2017-02-13 MED ORDER — ERGOCALCIFEROL 1.25 MG (50000 UT) PO CAPS: 50000.0000 [IU] | ORAL_CAPSULE | ORAL | 4 refills | Status: DC

## 2017-02-13 NOTE — Progress Notes (Signed)
Bailey Walker, a 71 year old female was seen in office on 02/11/2017. Reviewed labs. Vitamin D deficiency. Will start weekly Drisdol 50, 000 units. TSH is elevated, will increase levothyroxine to 75 mcg daily.    Meds ordered this encounter  Medications  . levothyroxine (SYNTHROID, LEVOTHROID) 75 MCG tablet    Sig: Take 1 tablet (75 mcg total) by mouth daily before breakfast.    Dispense:  60 tablet    Refill:  1  . ergocalciferol (DRISDOL) 50000 units capsule    Sig: Take 1 capsule (50,000 Units total) by mouth once a week.    Dispense:  12 capsule    Refill:  [place]  MSN, FNP-C [facility] [address] [place], [place] [address] [phone]

## 2017-03-12 ENCOUNTER — Ambulatory Visit: Payer: Medicaid Other | Admitting: Gastroenterology

## 2017-05-13 ENCOUNTER — Ambulatory Visit: Payer: Medicaid Other | Admitting: Family Medicine

## 2017-07-27 ENCOUNTER — Other Ambulatory Visit: Payer: Self-pay | Admitting: Family Medicine

## 2017-07-27 DIAGNOSIS — E039 Hypothyroidism, unspecified: Secondary | ICD-10-CM

## 2017-08-05 ENCOUNTER — Other Ambulatory Visit: Payer: Self-pay | Admitting: Family Medicine

## 2017-08-05 DIAGNOSIS — K219 Gastro-esophageal reflux disease without esophagitis: Secondary | ICD-10-CM

## 2017-08-12 ENCOUNTER — Other Ambulatory Visit: Payer: Self-pay

## 2017-08-12 ENCOUNTER — Emergency Department (HOSPITAL_BASED_OUTPATIENT_CLINIC_OR_DEPARTMENT_OTHER)
Admission: EM | Admit: 2017-08-12 | Discharge: 2017-08-12 | Disposition: A | Discharge status: Home / Self Care | Payer: Medicaid Other | Attending: Emergency Medicine | Admitting: Emergency Medicine

## 2017-08-12 ENCOUNTER — Encounter (HOSPITAL_BASED_OUTPATIENT_CLINIC_OR_DEPARTMENT_OTHER): Payer: Self-pay

## 2017-08-12 ENCOUNTER — Emergency Department (HOSPITAL_BASED_OUTPATIENT_CLINIC_OR_DEPARTMENT_OTHER): Payer: Medicaid Other

## 2017-08-12 DIAGNOSIS — R079 Chest pain, unspecified: Secondary | ICD-10-CM

## 2017-08-12 DIAGNOSIS — E119 Type 2 diabetes mellitus without complications: Secondary | ICD-10-CM | POA: Insufficient documentation

## 2017-08-12 DIAGNOSIS — E039 Hypothyroidism, unspecified: Secondary | ICD-10-CM | POA: Insufficient documentation

## 2017-08-12 DIAGNOSIS — Z79899 Other long term (current) drug therapy: Secondary | ICD-10-CM | POA: Insufficient documentation

## 2017-08-12 DIAGNOSIS — R1013 Epigastric pain: Secondary | ICD-10-CM | POA: Diagnosis not present

## 2017-08-12 DIAGNOSIS — Z7984 Long term (current) use of oral hypoglycemic drugs: Secondary | ICD-10-CM | POA: Diagnosis not present

## 2017-08-12 DIAGNOSIS — R0789 Other chest pain: Secondary | ICD-10-CM | POA: Insufficient documentation

## 2017-08-12 DIAGNOSIS — Z87891 Personal history of nicotine dependence: Secondary | ICD-10-CM | POA: Diagnosis not present

## 2017-08-12 DIAGNOSIS — I10 Essential (primary) hypertension: Secondary | ICD-10-CM | POA: Insufficient documentation

## 2017-08-12 LAB — CBC WITH DIFFERENTIAL/PLATELET
Basophils Absolute: 0 10*3/uL (ref 0.0–0.1)
Basophils Relative: 0 %
Eosinophils Absolute: 0.2 10*3/uL (ref 0.0–0.7)
Eosinophils Relative: 2 %
HCT: 34.8 % — ABNORMAL LOW (ref 36.0–46.0)
Hemoglobin: 11.2 g/dL — ABNORMAL LOW (ref 12.0–15.0)
Lymphocytes Relative: 25 %
Lymphs Abs: 2.1 10*3/uL (ref 0.7–4.0)
MCH: 26 pg (ref 26.0–34.0)
MCHC: 32.2 g/dL (ref 30.0–36.0)
MCV: 80.7 fL (ref 78.0–100.0)
Monocytes Absolute: 0.8 10*3/uL (ref 0.1–1.0)
Monocytes Relative: 10 %
Neutro Abs: 5.5 10*3/uL (ref 1.7–7.7)
Neutrophils Relative %: 63 %
Platelets: 285 10*3/uL (ref 150–400)
RBC: 4.31 MIL/uL (ref 3.87–5.11)
RDW: 14.5 % (ref 11.5–15.5)
WBC: 8.6 10*3/uL (ref 4.0–10.5)

## 2017-08-12 LAB — URINALYSIS, ROUTINE W REFLEX MICROSCOPIC
Bilirubin Urine: NEGATIVE
Glucose, UA: 100 mg/dL — AB
Hgb urine dipstick: NEGATIVE
Ketones, ur: NEGATIVE mg/dL
Leukocytes, UA: NEGATIVE
Nitrite: NEGATIVE
Protein, ur: NEGATIVE mg/dL
Specific Gravity, Urine: <1.005 — ABNORMAL LOW (ref 1.005–1.030)
pH: 6 (ref 5.0–8.0)

## 2017-08-12 LAB — COMPREHENSIVE METABOLIC PANEL
ALT: 13 U/L — ABNORMAL LOW (ref 14–54)
AST: 20 U/L (ref 15–41)
Albumin: 3.8 g/dL (ref 3.5–5.0)
Alkaline Phosphatase: 80 U/L (ref 38–126)
Anion gap: 7 (ref 5–15)
BUN: 13 mg/dL (ref 6–20)
CO2: 23 mmol/L (ref 22–32)
Calcium: 8.7 mg/dL — ABNORMAL LOW (ref 8.9–10.3)
Chloride: 103 mmol/L (ref 101–111)
Creatinine, Ser: 0.56 mg/dL (ref 0.44–1.00)
GFR calc Af Amer: >60 mL/min (ref >60)
GFR calc non Af Amer: >60 mL/min (ref >60)
Glucose, Bld: 216 mg/dL — ABNORMAL HIGH (ref 65–99)
Potassium: 3.5 mmol/L (ref 3.5–5.1)
Sodium: 133 mmol/L — ABNORMAL LOW (ref 135–145)
Total Bilirubin: 0.2 mg/dL — ABNORMAL LOW (ref 0.3–1.2)
Total Protein: 7.6 g/dL (ref 6.5–8.1)

## 2017-08-12 LAB — LIPASE, BLOOD: Lipase: 17 U/L (ref 11–51)

## 2017-08-12 LAB — TROPONIN I
Troponin I: <0.03 ng/mL (ref <0.03)
Troponin I: <0.03 ng/mL (ref <0.03)

## 2017-08-12 MED ORDER — ONDANSETRON HCL 4 MG/2ML IJ SOLN
4.0000 mg | Freq: Once | INTRAMUSCULAR | Status: AC
  Administered 2017-08-12: 4 mg via INTRAVENOUS
  Filled 2017-08-12: qty 2

## 2017-08-12 MED ORDER — GI COCKTAIL ~~LOC~~
30.0000 mL | Freq: Once | ORAL | Status: AC
  Administered 2017-08-12: 30 mL via ORAL
  Filled 2017-08-12: qty 30

## 2017-08-12 MED ORDER — IOPAMIDOL (ISOVUE-300) INJECTION 61%
100.0000 mL | Freq: Once | INTRAVENOUS | Status: AC | PRN
  Administered 2017-08-12: 100 mL via INTRAVENOUS

## 2017-08-12 MED ORDER — PANTOPRAZOLE SODIUM 40 MG IV SOLR
40.0000 mg | Freq: Once | INTRAVENOUS | Status: AC
  Administered 2017-08-12: 40 mg via INTRAVENOUS
  Filled 2017-08-12: qty 40

## 2017-08-12 MED ORDER — LABETALOL HCL 5 MG/ML IV SOLN
10.0000 mg | Freq: Once | INTRAVENOUS | Status: DC
  Filled 2017-08-12: qty 4

## 2017-08-12 MED ORDER — SODIUM CHLORIDE 0.9 % IV BOLUS (SEPSIS)
1000.0000 mL | Freq: Once | INTRAVENOUS | Status: AC
  Administered 2017-08-12: 1000 mL via INTRAVENOUS

## 2017-08-12 MED ORDER — FAMOTIDINE IN NACL 20-0.9 MG/50ML-% IV SOLN
20.0000 mg | Freq: Once | INTRAVENOUS | Status: AC
  Administered 2017-08-12: 20 mg via INTRAVENOUS
  Filled 2017-08-12: qty 50

## 2017-08-12 NOTE — ED Triage Notes (Signed)
Pt with CP x today-interpreter/neice with pt-pt NAD

## 2017-08-12 NOTE — ED Provider Notes (Signed)
[facility] EMERGENCY DEPARTMENT Provider Note   CSN: [id number] Arrival date & time: 08/12/17  1251     History   Chief Complaint Chief Complaint  Patient presents with  . Chest Pain    HPI Bailey Walker is a 71 y.o. female history of diabetes, hypertension, both thyroidism here presenting with chest pain.  Patient states that she ate breakfast this morning and right after eating, patient had epigastric pain that radiated to her lower chest.  She states that it is a nauseated feeling and just a dull ache in her chest.  Denies any radiation to the pain denies any back pain.  Patient's niece came home and noticed that her blood pressure was elevated around 170s and brought her in for evaluation. No hx of CAD or stents. No recent travel or leg swelling or calf pain. She took her BP meds this morning. She is on dexilant for possible reflux.   The history is provided by the patient.    Past Medical History:  Diagnosis Date  . Diabetes mellitus without complication ([place])   . Hypertension   . SVT (supraventricular tachycardia) ([place])   . Thyroid disease     Patient Active Problem List   Diagnosis Date Noted  . Essential hypertension 01/11/2017  . Hypothyroidism 01/08/2017  . SVT (supraventricular tachycardia) ([place]) 01/08/2017    Past Surgical History:  Procedure Laterality Date  . APPENDECTOMY    . KNEE SURGERY     left knee    OB History    No data available       Home Medications    Prior to Admission medications   Medication Sig Start Date End Date Taking? Authorizing Provider  Blood Pressure Monitoring (BLOOD PRESSURE MONITOR/M CUFF) MISC 1 each by Does not apply route 2 (two) times daily. 01/08/17   Dorena Dew, FNP  DEXILANT 30 MG capsule TAKE 1 CAPSULE BY MOUTH EVERY DAY 08/05/17   Dorena Dew, FNP  Dexlansoprazole (DEXILANT) 30 MG capsule Take 1 capsule (30 mg total) by mouth daily. 01/29/17   Dorena Dew, FNP  ergocalciferol  (DRISDOL) 50000 units capsule Take 1 capsule (50,000 Units total) by mouth once a week. 02/13/17   Dorena Dew, FNP  levothyroxine (SYNTHROID, LEVOTHROID) 75 MCG tablet TAKE 1 TABLET (75 MCG TOTAL) BY MOUTH DAILY BEFORE BREAKFAST. 07/28/17   Dorena Dew, FNP  metFORMIN (GLUCOPHAGE) 500 MG tablet Take 1 tablet (500 mg total) by mouth 2 (two) times daily with a meal. Patient not taking: Reported on 02/10/2017 01/08/17   Dorena Dew, FNP  Metoprolol Tartrate 37.5 MG TABS Take 37.5 mg by mouth 2 (two) times daily. 01/29/17   Dorena Dew, FNP    Family History No family history on file.  Social History Social History   Tobacco Use  . Smoking status: Former Smoker    Last attempt to quit: 12/29/2013    Years since quitting: 3.6  . Smokeless tobacco: Never Used  Substance Use Topics  . Alcohol use: No  . Drug use: No     Allergies   Ampicillin   Review of Systems Review of Systems  Cardiovascular: Positive for chest pain.  All other systems reviewed and are negative.    Physical Exam Updated Vital Signs BP 126/71   Pulse 72   Temp (!) 97.5 F (36.4 C) (Oral)   Resp 15   Ht 5\' 2"  (1.575 m)   Wt 80.7 kg (178 lb)   SpO2  95%   BMI 32.56 kg/m   Physical Exam  Constitutional: She appears well-developed and well-nourished.  HENT:  Head: Normocephalic.  MM slightly dry   Eyes: Pupils are equal, round, and reactive to light.  Neck: Normal range of motion.  Cardiovascular: Normal rate, regular rhythm and normal pulses.  Pulmonary/Chest: Effort normal.  Abdominal: Soft. Bowel sounds are normal.  Mild epigastric tenderness, no RUQ tenderness and no CVAT   Musculoskeletal: Normal range of motion.  Neurological: She is alert.  Skin: Skin is warm. Capillary refill takes less than 2 seconds.  Psychiatric: She has a normal mood and affect.  Nursing note and vitals reviewed.    ED Treatments / Results  Labs (all labs ordered are listed, but only abnormal  results are displayed) Labs Reviewed  CBC WITH DIFFERENTIAL/PLATELET - Abnormal; Notable for the following components:      Result Value   Hemoglobin 11.2 (*)    HCT 34.8 (*)    All other components within normal limits  COMPREHENSIVE METABOLIC PANEL - Abnormal; Notable for the following components:   Sodium 133 (*)    Glucose, Bld 216 (*)    Calcium 8.7 (*)    ALT 13 (*)    Total Bilirubin 0.2 (*)    All other components within normal limits  URINALYSIS, ROUTINE W REFLEX MICROSCOPIC - Abnormal; Notable for the following components:   Specific Gravity, Urine <1.005 (*)    Glucose, UA 100 (*)    All other components within normal limits  TROPONIN I  LIPASE, BLOOD  TROPONIN I    EKG  EKG Interpretation None       Radiology Dg Chest 2 View  Result Date: 08/12/2017 CLINICAL DATA:  Chest pain. EXAM: CHEST  2 VIEW COMPARISON:  Chest x-ray dated December 29, 2016. FINDINGS: The cardiomediastinal silhouette is borderline enlarged. Normal pulmonary vascularity. Unchanged eventration of the right hemidiaphragm. Stable atelectasis/ scarring at the peripheral left lung base. No focal consolidation, pleural effusion, or pneumothorax. No acute osseous abnormality. IMPRESSION: No active cardiopulmonary disease. Electronically Signed   By: Titus Dubin M.D.   On: 08/12/2017 13:20   US Abdomen Complete  Result Date: 08/12/2017 CLINICAL DATA:  Acute generalized abdominal pain. EXAM: ABDOMEN ULTRASOUND COMPLETE COMPARISON:  None. FINDINGS: Gallbladder: No gallstones or wall thickening visualized. No sonographic Murphy sign noted by sonographer. Common bile duct: Diameter: 3 mm which is within normal limits. Liver: Increased echogenicity of hepatic parenchyma is noted consistent with fatty infiltration. 1.2 cm simple right hepatic cyst is noted. Portal vein is patent on color Doppler imaging with normal direction of blood flow towards the liver. IVC: No abnormality visualized. Pancreas: Visualized  portion unremarkable. Spleen: Size and appearance within normal limits. Right Kidney: Length: 10.6 cm. Echogenicity within normal limits. No mass or hydronephrosis visualized. Left Kidney: Length: 10.1 cm. Echogenicity within normal limits. No mass or hydronephrosis visualized. Abdominal aorta: No aneurysm visualized. Other findings: None. IMPRESSION: Increased echogenicity of hepatic parenchyma is noted suggesting fatty infiltration or other diffuse hepatocellular disease. Right hepatic cyst is noted. No other significant abnormality seen in the abdomen. Electronically Signed   By: Marijo Conception, M.D.   On: 08/12/2017 14:57    Procedures Procedures (including critical care time)  Medications Ordered in ED Medications  famotidine (PEPCID) IVPB 20 mg premix (0 mg Intravenous Stopped 08/12/17 1424)  pantoprazole (PROTONIX) injection 40 mg (40 mg Intravenous Given 08/12/17 1343)  gi cocktail (Maalox,Lidocaine,Donnatal) (30 mLs Oral Given 08/12/17 1343)  sodium chloride  0.9 % bolus 1,000 mL (0 mLs Intravenous Stopped 08/12/17 1453)     Initial Impression / Assessment and Plan / ED Course  I have reviewed the triage vital signs and the nursing notes.  Pertinent labs & imaging results that were available during my care of the patient were reviewed by me and considered in my medical decision making (see chart for details).     Lailie Canny is a 71 y.o. female here with chest pain after eating. Likely gastritis vs reflux vs biliary colic vs hypertensive urgency. No signs of dissection and I doubt PE. Low suspicion for ACS. Will get labs, lipase, trop x 2, CXR, US abdomen to r/o biliary colic.   0:92 PM US showed hepatocellular disease. Pain resolved with pepcid, GI cocktail. LFTs and lipase nl. Initial trop neg. Delta trop at 4:30 pm. Will get CT ab/pel. If CT unremarkable and trop neg, anticipate discharge with GI follow up. Signed out to Dr. Cathleen Fears in the ED.    Final Clinical  Impressions(s) / ED Diagnoses   Final diagnoses:  None    ED Discharge Orders    None       Drenda Freeze, MD 08/12/17 [phone]

## 2017-08-12 NOTE — ED Notes (Signed)
Pt returned from US

## 2017-08-12 NOTE — Discharge Instructions (Addendum)
Take pepcid 20 mg daily in addition to your dexilant.   See the gastroenterology doctor for follow up.   Return to ER if you have worse chest pain, abdominal pain, vomiting.   Call the [facility] heart care office  in 2 days to arrange for further testing to check your heart.  Tell office staff that you were seen here when scheduling the appointment.

## 2017-08-12 NOTE — ED Provider Notes (Signed)
History is obtained from patient using professional medical interpreter.  Patient speaks no English complained of of epigastric pain onset this morning after eating.  She has had similar discomfort for approximate 4 years after eating.  Discomfort is nonexertional.  She does complain of dyspnea with exertion for the past 4 years since she stopped smoking.  Presently asymptomatic since treatment with GI cocktail here.  On exam no distress lungs clear to auscultation heart regular rate and rhythm abdomen nondistended nontender extremities without edema ED ECG REPORT   Date: 08/12/2017  Rate: 85  Rhythm: normal sinus rhythm  QRS Axis: normal  Intervals: normal  ST/T Wave abnormalities: nonspecific T wave changes  Conduction Disutrbances:none  Narrative Interpretation:   Old EKG Reviewed: Slower than 12/29/2016 otherwise unchanged  I have personally reviewed the EKG tracing and agree with the computerized printout as noted. Results for orders placed or performed during the hospital encounter of 08/12/17  Troponin I  Result Value Ref Range   Troponin I <0.03 <0.03 ng/mL  CBC with Differential/Platelet  Result Value Ref Range   WBC 8.6 4.0 - 10.5 K/uL   RBC 4.31 3.87 - 5.11 MIL/uL   Hemoglobin 11.2 (L) 12.0 - 15.0 g/dL   HCT 34.8 (L) 36.0 - 46.0 %   MCV 80.7 78.0 - 100.0 fL   MCH 26.0 26.0 - 34.0 pg   MCHC 32.2 30.0 - 36.0 g/dL   RDW 14.5 11.5 - 15.5 %   Platelets 285 150 - 400 K/uL   Neutrophils Relative % 63 %   Neutro Abs 5.5 1.7 - 7.7 K/uL   Lymphocytes Relative 25 %   Lymphs Abs 2.1 0.7 - 4.0 K/uL   Monocytes Relative 10 %   Monocytes Absolute 0.8 0.1 - 1.0 K/uL   Eosinophils Relative 2 %   Eosinophils Absolute 0.2 0.0 - 0.7 K/uL   Basophils Relative 0 %   Basophils Absolute 0.0 0.0 - 0.1 K/uL  Comprehensive metabolic panel  Result Value Ref Range   Sodium 133 (L) 135 - 145 mmol/L   Potassium 3.5 3.5 - 5.1 mmol/L   Chloride 103 101 - 111 mmol/L   CO2 23 22 - 32 mmol/L    Glucose, Bld 216 (H) 65 - 99 mg/dL   BUN 13 6 - 20 mg/dL   Creatinine, Ser 0.56 0.44 - 1.00 mg/dL   Calcium 8.7 (L) 8.9 - 10.3 mg/dL   Total Protein 7.6 6.5 - 8.1 g/dL   Albumin 3.8 3.5 - 5.0 g/dL   AST 20 15 - 41 U/L   ALT 13 (L) 14 - 54 U/L   Alkaline Phosphatase 80 38 - 126 U/L   Total Bilirubin 0.2 (L) 0.3 - 1.2 mg/dL   GFR calc non Af Amer >60 >60 mL/min   GFR calc Af Amer >60 >60 mL/min   Anion gap 7 5 - 15  Lipase, blood  Result Value Ref Range   Lipase 17 11 - 51 U/L  Urinalysis, Routine w reflex microscopic  Result Value Ref Range   Color, Urine YELLOW YELLOW   APPearance CLEAR CLEAR   Specific Gravity, Urine <1.005 (L) 1.005 - 1.030   pH 6.0 5.0 - 8.0   Glucose, UA 100 (A) NEGATIVE mg/dL   Hgb urine dipstick NEGATIVE NEGATIVE   Bilirubin Urine NEGATIVE NEGATIVE   Ketones, ur NEGATIVE NEGATIVE mg/dL   Protein, ur NEGATIVE NEGATIVE mg/dL   Nitrite NEGATIVE NEGATIVE   Leukocytes, UA NEGATIVE NEGATIVE  Troponin I  Result Value  Ref Range   Troponin I <0.03 <0.03 ng/mL   Dg Chest 2 View  Result Date: 08/12/2017 CLINICAL DATA:  Chest pain. EXAM: CHEST  2 VIEW COMPARISON:  Chest x-ray dated December 29, 2016. FINDINGS: The cardiomediastinal silhouette is borderline enlarged. Normal pulmonary vascularity. Unchanged eventration of the right hemidiaphragm. Stable atelectasis/ scarring at the peripheral left lung base. No focal consolidation, pleural effusion, or pneumothorax. No acute osseous abnormality. IMPRESSION: No active cardiopulmonary disease. Electronically Signed   By: Titus Dubin M.D.   On: 08/12/2017 13:20   US Abdomen Complete  Result Date: 08/12/2017 CLINICAL DATA:  Acute generalized abdominal pain. EXAM: ABDOMEN ULTRASOUND COMPLETE COMPARISON:  None. FINDINGS: Gallbladder: No gallstones or wall thickening visualized. No sonographic Murphy sign noted by sonographer. Common bile duct: Diameter: 3 mm which is within normal limits. Liver: Increased echogenicity of  hepatic parenchyma is noted consistent with fatty infiltration. 1.2 cm simple right hepatic cyst is noted. Portal vein is patent on color Doppler imaging with normal direction of blood flow towards the liver. IVC: No abnormality visualized. Pancreas: Visualized portion unremarkable. Spleen: Size and appearance within normal limits. Right Kidney: Length: 10.6 cm. Echogenicity within normal limits. No mass or hydronephrosis visualized. Left Kidney: Length: 10.1 cm. Echogenicity within normal limits. No mass or hydronephrosis visualized. Abdominal aorta: No aneurysm visualized. Other findings: None. IMPRESSION: Increased echogenicity of hepatic parenchyma is noted suggesting fatty infiltration or other diffuse hepatocellular disease. Right hepatic cyst is noted. No other significant abnormality seen in the abdomen. Electronically Signed   By: Marijo Conception, M.D.   On: 08/12/2017 14:57   Ct Abdomen Pelvis W Contrast  Result Date: 08/12/2017 CLINICAL DATA:  Epigastric and lower chest pain. EXAM: CT ABDOMEN AND PELVIS WITH CONTRAST TECHNIQUE: Multidetector CT imaging of the abdomen and pelvis was performed using the standard protocol following bolus administration of intravenous contrast. CONTRAST:  1107mL ISOVUE-300 IOPAMIDOL (ISOVUE-300) INJECTION 61% COMPARISON:  08/12/2017 abdominal ultrasound. FINDINGS: Lower chest: Normal heart size with coronary arteriosclerosis. No pericardial effusion. Small to moderate sized hiatal hernia. Dependent atelectasis at each lung base. Hepatobiliary: Mild hepatic steatosis. Faint vascular blush in the right hepatic lobe, nonspecific in etiology, possibly representing a capillary hemangioma. Punctate right hepatic granuloma. No biliary dilatation. Pancreas: Atrophic without inflammation or ductal dilatation. No focal mass. Spleen: Normal Adrenals/Urinary Tract: Adrenal glands are unremarkable. Kidneys are normal, without renal calculi, focal lesion, or hydronephrosis. Bladder is  unremarkable. Stomach/Bowel: Status post appendectomy by report. The stomach, small intestine and colon are nonacute. No obstruction or inflammation. Vascular/Lymphatic: Moderate aortoiliac atherosclerosis without aneurysm or dissection. No lymphadenopathy. Reproductive: Uterus and bilateral adnexa are unremarkable. Other: No abdominal wall hernia or abnormality. No abdominopelvic ascites. Musculoskeletal: Degenerative disc disease L4-5 and L5-S1 with straightening of lumbar lordosis. Subchondral cystic change along the posterior inferior corner of L2. IMPRESSION: 1. Hepatic steatosis. Faint vascular blush in the right hepatic lobe is nonspecific but may represent a capillary hemangioma or possibly small vascular malformation. 2. 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 3. Coronary arteriosclerosis and aortic atherosclerosis. 4. Small to moderate-sized hiatal hernia which may account for the patient's lower chest and epigastric pain. Electronically Signed   By: Ashley Royalty M.D.   On: 08/12/2017 17:05   Score equals 3.  Plan referral [facility].  As outpatient.  It is  related to  Diagnosis #1 epigastric pain 2 hyperglycemia   Orlie Dakin, MD 08/12/17 [phone]

## 2017-08-13 ENCOUNTER — Emergency Department (HOSPITAL_BASED_OUTPATIENT_CLINIC_OR_DEPARTMENT_OTHER)
Admission: EM | Admit: 2017-08-13 | Discharge: 2017-08-13 | Disposition: A | Discharge status: Home / Self Care | Payer: Medicaid Other | Attending: Physician Assistant | Admitting: Physician Assistant

## 2017-08-13 ENCOUNTER — Emergency Department (HOSPITAL_BASED_OUTPATIENT_CLINIC_OR_DEPARTMENT_OTHER): Payer: Medicaid Other

## 2017-08-13 ENCOUNTER — Encounter (HOSPITAL_BASED_OUTPATIENT_CLINIC_OR_DEPARTMENT_OTHER): Payer: Self-pay

## 2017-08-13 ENCOUNTER — Other Ambulatory Visit: Payer: Self-pay

## 2017-08-13 DIAGNOSIS — J029 Acute pharyngitis, unspecified: Secondary | ICD-10-CM | POA: Insufficient documentation

## 2017-08-13 DIAGNOSIS — R221 Localized swelling, mass and lump, neck: Secondary | ICD-10-CM | POA: Diagnosis present

## 2017-08-13 DIAGNOSIS — E119 Type 2 diabetes mellitus without complications: Secondary | ICD-10-CM | POA: Diagnosis not present

## 2017-08-13 DIAGNOSIS — Z79899 Other long term (current) drug therapy: Secondary | ICD-10-CM | POA: Insufficient documentation

## 2017-08-13 DIAGNOSIS — I10 Essential (primary) hypertension: Secondary | ICD-10-CM | POA: Diagnosis not present

## 2017-08-13 DIAGNOSIS — Z7984 Long term (current) use of oral hypoglycemic drugs: Secondary | ICD-10-CM | POA: Insufficient documentation

## 2017-08-13 DIAGNOSIS — Z87891 Personal history of nicotine dependence: Secondary | ICD-10-CM | POA: Insufficient documentation

## 2017-08-13 LAB — CBC WITH DIFFERENTIAL/PLATELET
Basophils Absolute: 0 10*3/uL (ref 0.0–0.1)
Basophils Relative: 0 %
Eosinophils Absolute: 0.1 10*3/uL (ref 0.0–0.7)
Eosinophils Relative: 1 %
HCT: 34.4 % — ABNORMAL LOW (ref 36.0–46.0)
Hemoglobin: 10.8 g/dL — ABNORMAL LOW (ref 12.0–15.0)
Lymphocytes Relative: 23 %
Lymphs Abs: 2.2 10*3/uL (ref 0.7–4.0)
MCH: 25.7 pg — ABNORMAL LOW (ref 26.0–34.0)
MCHC: 31.4 g/dL (ref 30.0–36.0)
MCV: 81.9 fL (ref 78.0–100.0)
Monocytes Absolute: 0.9 10*3/uL (ref 0.1–1.0)
Monocytes Relative: 9 %
Neutro Abs: 6.4 10*3/uL (ref 1.7–7.7)
Neutrophils Relative %: 67 %
Platelets: 261 10*3/uL (ref 150–400)
RBC: 4.2 MIL/uL (ref 3.87–5.11)
RDW: 14.4 % (ref 11.5–15.5)
WBC: 9.6 10*3/uL (ref 4.0–10.5)

## 2017-08-13 LAB — BASIC METABOLIC PANEL
Anion gap: 8 (ref 5–15)
BUN: 14 mg/dL (ref 6–20)
CO2: 25 mmol/L (ref 22–32)
Calcium: 8.7 mg/dL — ABNORMAL LOW (ref 8.9–10.3)
Chloride: 104 mmol/L (ref 101–111)
Creatinine, Ser: 0.67 mg/dL (ref 0.44–1.00)
GFR calc Af Amer: >60 mL/min (ref >60)
GFR calc non Af Amer: >60 mL/min (ref >60)
Glucose, Bld: 161 mg/dL — ABNORMAL HIGH (ref 65–99)
Potassium: 3.7 mmol/L (ref 3.5–5.1)
Sodium: 137 mmol/L (ref 135–145)

## 2017-08-13 LAB — RAPID STREP SCREEN (MED CTR MEBANE ONLY): Streptococcus, Group A Screen (Direct): NEGATIVE

## 2017-08-13 MED ORDER — CLINDAMYCIN HCL 150 MG PO CAPS
300.0000 mg | ORAL_CAPSULE | Freq: Once | ORAL | Status: AC
  Administered 2017-08-13: 300 mg via ORAL
  Filled 2017-08-13: qty 2

## 2017-08-13 MED ORDER — IOPAMIDOL (ISOVUE-300) INJECTION 61%
100.0000 mL | Freq: Once | INTRAVENOUS | Status: AC | PRN
  Administered 2017-08-13: 75 mL via INTRAVENOUS

## 2017-08-13 MED ORDER — CLINDAMYCIN HCL 300 MG PO CAPS: 300.0000 mg | ORAL_CAPSULE | Freq: Four times a day (QID) | ORAL | 0 refills | Status: AC

## 2017-08-13 MED ORDER — DEXAMETHASONE SODIUM PHOSPHATE 10 MG/ML IJ SOLN
10.0000 mg | Freq: Once | INTRAMUSCULAR | Status: AC
  Administered 2017-08-13: 10 mg via INTRAVENOUS
  Filled 2017-08-13: qty 1

## 2017-08-13 NOTE — ED Provider Notes (Addendum)
[facility] EMERGENCY DEPARTMENT Provider Note   CSN: [id number] Arrival date & time: 08/13/17  1302     History   Chief Complaint Chief Complaint  Patient presents with  . Neck Pain    HPI Bailey Walker is a 71 y.o. female.  Patient is a 71 year old female with a history of diabetes, hypertension thyroid disease and SVT who presents with neck swelling.  She was seen here yesterday for chest pain.  She had some associated nausea and vomiting.  The daughter states that the symptoms have resolved but since that time she has had some worsening pain to her throat.  She states it hurts when she swallows.  She is also noticed some swelling to the anterior neck.  No known fevers.  No shortness of breath.  No chest congestion or cough.  The patient feels like there is some congestion in her throat which she is unable to clear.  She has not taken any medications at home for the symptoms.      Past Medical History:  Diagnosis Date  . Diabetes mellitus without complication ([place])   . Hypertension   . SVT (supraventricular tachycardia) ([place])   . Thyroid disease     Patient Active Problem List   Diagnosis Date Noted  . Essential hypertension 01/11/2017  . Hypothyroidism 01/08/2017  . SVT (supraventricular tachycardia) ([place]) 01/08/2017    Past Surgical History:  Procedure Laterality Date  . APPENDECTOMY    . KNEE SURGERY     left knee    OB History    No data available       Home Medications    Prior to Admission medications   Medication Sig Start Date End Date Taking? Authorizing Provider  Blood Pressure Monitoring (BLOOD PRESSURE MONITOR/M CUFF) MISC 1 each by Does not apply route 2 (two) times daily. 01/08/17   Dorena Dew, FNP  DEXILANT 30 MG capsule TAKE 1 CAPSULE BY MOUTH EVERY DAY 08/05/17   Dorena Dew, FNP  Dexlansoprazole (DEXILANT) 30 MG capsule Take 1 capsule (30 mg total) by mouth daily. 01/29/17   Dorena Dew, FNP  ergocalciferol  (DRISDOL) 50000 units capsule Take 1 capsule (50,000 Units total) by mouth once a week. 02/13/17   Dorena Dew, FNP  levothyroxine (SYNTHROID, LEVOTHROID) 75 MCG tablet TAKE 1 TABLET (75 MCG TOTAL) BY MOUTH DAILY BEFORE BREAKFAST. 07/28/17   Dorena Dew, FNP  metFORMIN (GLUCOPHAGE) 500 MG tablet Take 1 tablet (500 mg total) by mouth 2 (two) times daily with a meal. Patient not taking: Reported on 02/10/2017 01/08/17   Dorena Dew, FNP  Metoprolol Tartrate 37.5 MG TABS Take 37.5 mg by mouth 2 (two) times daily. 01/29/17   Dorena Dew, FNP    Family History No family history on file.  Social History Social History   Tobacco Use  . Smoking status: Former Smoker    Last attempt to quit: 12/29/2013    Years since quitting: 3.6  . Smokeless tobacco: Never Used  Substance Use Topics  . Alcohol use: No  . Drug use: No     Allergies   Ampicillin   Review of Systems Review of Systems  Constitutional: Negative for chills, diaphoresis, fatigue and fever.  HENT: Positive for congestion, sore throat and trouble swallowing. Negative for rhinorrhea, sneezing and voice change.   Eyes: Negative.   Respiratory: Negative for cough, chest tightness and shortness of breath.   Cardiovascular: Negative for chest pain and leg swelling.  Gastrointestinal: Negative for abdominal pain, blood in stool, diarrhea, nausea and vomiting.  Genitourinary: Negative for difficulty urinating, flank pain, frequency and hematuria.  Musculoskeletal: Negative for arthralgias and back pain.  Skin: Negative for rash.  Neurological: Negative for dizziness, speech difficulty, weakness, numbness and headaches.     Physical Exam Updated Vital Signs BP 133/72 (BP Location: Left Arm)   Pulse 92   Temp 97.9 F (36.6 C) (Oral)   Resp 18   Wt 80.7 kg (178 lb)   SpO2 95%   BMI 32.56 kg/m   Physical Exam  Constitutional: She is oriented to person, place, and time. She appears well-developed and  well-nourished.  HENT:  Head: Normocephalic and atraumatic.  Mouth/Throat: Oropharynx is clear and moist.  Eyes: Pupils are equal, round, and reactive to light.  Neck: Normal range of motion. Neck supple.  Patient has what appears to be a goiter.  However there is increased swelling to the right side of the anterior neck which patient's daughter says is not normal for her.  No trismus, uvula is midline  Cardiovascular: Normal rate, regular rhythm and normal heart sounds.  Pulmonary/Chest: Effort normal and breath sounds normal. No respiratory distress. She has no wheezes. She has no rales. She exhibits no tenderness.  Abdominal: Soft. Bowel sounds are normal. There is no tenderness. There is no rebound and no guarding.  Musculoskeletal: Normal range of motion. She exhibits no edema.  Lymphadenopathy:    She has no cervical adenopathy.  Neurological: She is alert and oriented to person, place, and time.  Skin: Skin is warm and dry. No rash noted.  Psychiatric: She has a normal mood and affect.     ED Treatments / Results  Labs (all labs ordered are listed, but only abnormal results are displayed) Labs Reviewed  RAPID STREP SCREEN (NOT AT [facility])  BASIC METABOLIC PANEL  CBC WITH DIFFERENTIAL/PLATELET    EKG  EKG Interpretation None       Radiology Dg Chest 2 View  Result Date: 08/12/2017 CLINICAL DATA:  Chest pain. EXAM: CHEST  2 VIEW COMPARISON:  Chest x-ray dated December 29, 2016. FINDINGS: The cardiomediastinal silhouette is borderline enlarged. Normal pulmonary vascularity. Unchanged eventration of the right hemidiaphragm. Stable atelectasis/ scarring at the peripheral left lung base. No focal consolidation, pleural effusion, or pneumothorax. No acute osseous abnormality. IMPRESSION: No active cardiopulmonary disease. Electronically Signed   By: Titus Dubin M.D.   On: 08/12/2017 13:20   US Abdomen Complete  Result Date: 08/12/2017 CLINICAL DATA:  Acute generalized  abdominal pain. EXAM: ABDOMEN ULTRASOUND COMPLETE COMPARISON:  None. FINDINGS: Gallbladder: No gallstones or wall thickening visualized. No sonographic Murphy sign noted by sonographer. Common bile duct: Diameter: 3 mm which is within normal limits. Liver: Increased echogenicity of hepatic parenchyma is noted consistent with fatty infiltration. 1.2 cm simple right hepatic cyst is noted. Portal vein is patent on color Doppler imaging with normal direction of blood flow towards the liver. IVC: No abnormality visualized. Pancreas: Visualized portion unremarkable. Spleen: Size and appearance within normal limits. Right Kidney: Length: 10.6 cm. Echogenicity within normal limits. No mass or hydronephrosis visualized. Left Kidney: Length: 10.1 cm. Echogenicity within normal limits. No mass or hydronephrosis visualized. Abdominal aorta: No aneurysm visualized. Other findings: None. IMPRESSION: Increased echogenicity of hepatic parenchyma is noted suggesting fatty infiltration or other diffuse hepatocellular disease. Right hepatic cyst is noted. No other significant abnormality seen in the abdomen. Electronically Signed   By: Marijo Conception, M.D.   On:  08/12/2017 14:57   Ct Abdomen Pelvis W Contrast  Result Date: 08/12/2017 CLINICAL DATA:  Epigastric and lower chest pain. EXAM: CT ABDOMEN AND PELVIS WITH CONTRAST TECHNIQUE: Multidetector CT imaging of the abdomen and pelvis was performed using the standard protocol following bolus administration of intravenous contrast. CONTRAST:  119mL ISOVUE-300 IOPAMIDOL (ISOVUE-300) INJECTION 61% COMPARISON:  08/12/2017 abdominal ultrasound. FINDINGS: Lower chest: Normal heart size with coronary arteriosclerosis. No pericardial effusion. Small to moderate sized hiatal hernia. Dependent atelectasis at each lung base. Hepatobiliary: Mild hepatic steatosis. Faint vascular blush in the right hepatic lobe, nonspecific in etiology, possibly representing a capillary hemangioma. Punctate  right hepatic granuloma. No biliary dilatation. Pancreas: Atrophic without inflammation or ductal dilatation. No focal mass. Spleen: Normal Adrenals/Urinary Tract: Adrenal glands are unremarkable. Kidneys are normal, without renal calculi, focal lesion, or hydronephrosis. Bladder is unremarkable. Stomach/Bowel: Status post appendectomy by report. The stomach, small intestine and colon are nonacute. No obstruction or inflammation. Vascular/Lymphatic: Moderate aortoiliac atherosclerosis without aneurysm or dissection. No lymphadenopathy. Reproductive: Uterus and bilateral adnexa are unremarkable. Other: No abdominal wall hernia or abnormality. No abdominopelvic ascites. Musculoskeletal: Degenerative disc disease L4-5 and L5-S1 with straightening of lumbar lordosis. Subchondral cystic change along the posterior inferior corner of L2. IMPRESSION: 1. Hepatic steatosis. Faint vascular blush in the right hepatic lobe is nonspecific but may represent a capillary hemangioma or possibly small vascular malformation. 2. 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 3. Coronary arteriosclerosis and aortic atherosclerosis. 4. Small to moderate-sized hiatal hernia which may account for the patient's lower chest and epigastric pain. Electronically Signed   By: Ashley Royalty M.D.   On: 08/12/2017 17:05    Procedures Procedures (including critical care time)  Medications Ordered in ED Medications - No data to display   Initial Impression / Assessment and Plan / ED Course  I have reviewed the triage vital signs and the nursing notes.  Pertinent labs & imaging results that were available during my care of the patient were reviewed by me and considered in my medical decision making (see chart for details).     Pt awaiting CT scan of neck, strept neg.  Care taken over by Dr. Thomasene Lot.  08/14/17 pt's daughter was notified by telephone that she had a lung nodule that will need a repeat CT scan in 3 months.  Final  Clinical Impressions(s) / ED Diagnoses   Final diagnoses:  None    ED Discharge Orders    None       Malvin Johns, MD 08/14/17 [id number]    Malvin Johns, MD 08/14/17 [phone]

## 2017-08-13 NOTE — ED Provider Notes (Signed)
71 year old female presenting with sore throat and swelling to the external part of her neck.  CT shows pharyngitis/supraglottic infection.  Patient has patent airway, swallowing without pain or difficulty.  She appears well with normal vital signs and no fever.  Nontoxic.  And gave family instructions about taking antibiotics, we will give Decadron to help avoid any kind of swelling.  Strict return precautions including fever, difficulty swallowing, or worsening sore throat.  Currently meets no inpatient requirements.   Macarthur Critchley, MD 08/13/17 1614

## 2017-08-13 NOTE — ED Triage Notes (Signed)
C/o "knots" to anterior neck-painful and swelling-NAD-steady gait-niece interpreting for pt

## 2017-08-13 NOTE — ED Notes (Signed)
CT will wait for bun/creat to result prior to imaging, given the recent injection of iv contrast 08/12/17 and pts history of DM

## 2017-08-16 LAB — CULTURE, GROUP A STREP (THRC)

## 2017-08-18 ENCOUNTER — Encounter: Payer: Self-pay | Admitting: Nurse Practitioner

## 2017-08-21 ENCOUNTER — Emergency Department (HOSPITAL_BASED_OUTPATIENT_CLINIC_OR_DEPARTMENT_OTHER): Payer: Medicaid Other

## 2017-08-21 ENCOUNTER — Emergency Department (HOSPITAL_BASED_OUTPATIENT_CLINIC_OR_DEPARTMENT_OTHER)
Admission: EM | Admit: 2017-08-21 | Discharge: 2017-08-22 | Disposition: A | Discharge status: Home / Self Care | Payer: Medicaid Other | Attending: Emergency Medicine | Admitting: Emergency Medicine

## 2017-08-21 ENCOUNTER — Other Ambulatory Visit: Payer: Self-pay

## 2017-08-21 ENCOUNTER — Encounter (HOSPITAL_BASED_OUTPATIENT_CLINIC_OR_DEPARTMENT_OTHER): Payer: Self-pay | Admitting: *Deleted

## 2017-08-21 DIAGNOSIS — J9801 Acute bronchospasm: Secondary | ICD-10-CM | POA: Diagnosis not present

## 2017-08-21 DIAGNOSIS — I10 Essential (primary) hypertension: Secondary | ICD-10-CM | POA: Diagnosis not present

## 2017-08-21 DIAGNOSIS — E119 Type 2 diabetes mellitus without complications: Secondary | ICD-10-CM | POA: Diagnosis not present

## 2017-08-21 DIAGNOSIS — Z79899 Other long term (current) drug therapy: Secondary | ICD-10-CM | POA: Insufficient documentation

## 2017-08-21 DIAGNOSIS — R079 Chest pain, unspecified: Secondary | ICD-10-CM | POA: Diagnosis present

## 2017-08-21 LAB — CBC WITH DIFFERENTIAL/PLATELET
Basophils Absolute: 0 10*3/uL (ref 0.0–0.1)
Basophils Relative: 0 %
Eosinophils Absolute: 0.2 10*3/uL (ref 0.0–0.7)
Eosinophils Relative: 2 %
HCT: 35 % — ABNORMAL LOW (ref 36.0–46.0)
Hemoglobin: 10.9 g/dL — ABNORMAL LOW (ref 12.0–15.0)
Lymphocytes Relative: 34 %
Lymphs Abs: 3.1 10*3/uL (ref 0.7–4.0)
MCH: 25.4 pg — ABNORMAL LOW (ref 26.0–34.0)
MCHC: 31.1 g/dL (ref 30.0–36.0)
MCV: 81.6 fL (ref 78.0–100.0)
Monocytes Absolute: 1.2 10*3/uL — ABNORMAL HIGH (ref 0.1–1.0)
Monocytes Relative: 13 %
Neutro Abs: 4.6 10*3/uL (ref 1.7–7.7)
Neutrophils Relative %: 51 %
Platelets: 291 10*3/uL (ref 150–400)
RBC: 4.29 MIL/uL (ref 3.87–5.11)
RDW: 14.5 % (ref 11.5–15.5)
WBC: 9.1 10*3/uL (ref 4.0–10.5)

## 2017-08-21 LAB — BASIC METABOLIC PANEL
Anion gap: 8 (ref 5–15)
BUN: 13 mg/dL (ref 6–20)
CO2: 22 mmol/L (ref 22–32)
Calcium: 8.6 mg/dL — ABNORMAL LOW (ref 8.9–10.3)
Chloride: 105 mmol/L (ref 101–111)
Creatinine, Ser: 0.59 mg/dL (ref 0.44–1.00)
GFR calc Af Amer: >60 mL/min (ref >60)
GFR calc non Af Amer: >60 mL/min (ref >60)
Glucose, Bld: 224 mg/dL — ABNORMAL HIGH (ref 65–99)
Potassium: 3.9 mmol/L (ref 3.5–5.1)
Sodium: 135 mmol/L (ref 135–145)

## 2017-08-21 MED ORDER — IPRATROPIUM-ALBUTEROL 0.5-2.5 (3) MG/3ML IN SOLN
3.0000 mL | RESPIRATORY_TRACT | Status: DC
  Administered 2017-08-21: 3 mL via RESPIRATORY_TRACT
  Filled 2017-08-21: qty 3

## 2017-08-21 NOTE — ED Triage Notes (Signed)
Pain in her chest for an hour. She has been seen x 2 in the past 2 weeks for same. She has appointments with a cardiologist and gastroenterologist.

## 2017-08-21 NOTE — ED Notes (Signed)
ED Provider at bedside. 

## 2017-08-21 NOTE — ED Provider Notes (Signed)
[facility] Provider Note: Bailey Spurling, MD, FACEP  CSN: [id number] MRN: [id number] ARRIVAL: 08/21/17 at 2159 ROOM: [place]  Chest Pain   HISTORY OF PRESENT ILLNESS  08/21/17 11:01 PM Bailey Walker is a 71 y.o. female who is been having episodes of chest discomfort and shortness of breath off and on for the past several months.  These episodes have worsened over the past 2 weeks.  She has been seen in the ED twice for this (for "epigastric pain" and "neck swelling").  Workups have been unremarkable and she has cardiology and gastroenterology follow-up scheduled.  She was treated for GERD and advised to discontinue taking aspirin.  She is here with another episode that began about 8:30 PM.  It is described as a tightness or pressure in the center of her chest which does not radiate.  It is associated with a sensation that she cannot get a deep breath.  When she does take a deep breath it makes the symptoms better.  She also feels shaky when these episodes occur.  She did have a small amount of vomiting earlier but is not nauseated now.  She is not diaphoretic with this.  There does not appear to be an exertional component.  She rates her symptoms as a 10 out of 10 at their worst but symptoms have improved since onset.  She states she feels her chest is congested although she discontinued taking over-the-counter cough medicine some time back.  She is a former smoker.   Past Medical History:  Diagnosis Date  . Diabetes mellitus without complication ([place])   . Hypertension   . SVT (supraventricular tachycardia) ([place])   . Thyroid disease     Past Surgical History:  Procedure Laterality Date  . APPENDECTOMY    . KNEE SURGERY     left knee    No family history on file.  Social History   Tobacco Use  . Smoking status: Former Smoker    Last attempt to quit: 12/29/2013    Years since quitting: 3.6  . Smokeless tobacco: Never Used  Substance Use Topics    . Alcohol use: No  . Drug use: No    Prior to Admission medications   Medication Sig Start Date End Date Taking? Authorizing Provider  Blood Pressure Monitoring (BLOOD PRESSURE MONITOR/M CUFF) MISC 1 each by Does not apply route 2 (two) times daily. 01/08/17   Dorena Dew, FNP  DEXILANT 30 MG capsule TAKE 1 CAPSULE BY MOUTH EVERY DAY 08/05/17   Dorena Dew, FNP  Dexlansoprazole (DEXILANT) 30 MG capsule Take 1 capsule (30 mg total) by mouth daily. 01/29/17   Dorena Dew, FNP  ergocalciferol (DRISDOL) 50000 units capsule Take 1 capsule (50,000 Units total) by mouth once a week. 02/13/17   Dorena Dew, FNP  levothyroxine (SYNTHROID, LEVOTHROID) 75 MCG tablet TAKE 1 TABLET (75 MCG TOTAL) BY MOUTH DAILY BEFORE BREAKFAST. 07/28/17   Dorena Dew, FNP  metFORMIN (GLUCOPHAGE) 500 MG tablet Take 1 tablet (500 mg total) by mouth 2 (two) times daily with a meal. Patient not taking: Reported on 02/10/2017 01/08/17   Dorena Dew, FNP  Metoprolol Tartrate 37.5 MG TABS Take 37.5 mg by mouth 2 (two) times daily. 01/29/17   Dorena Dew, FNP    Allergies Ampicillin   REVIEW OF SYSTEMS  Negative except as noted here or in the History of Present Illness.   PHYSICAL EXAMINATION  Initial Vital Signs Blood  pressure (!) 186/94, pulse (!) 102, temperature 98.2 F (36.8 C), temperature source Oral, resp. rate 18, height 5\' 2"  (1.575 m), weight 80.7 kg (178 lb), SpO2 100 %.  Examination General: Well-developed, well-nourished female in no acute distress; appearance consistent with age of record HENT: normocephalic; atraumatic Eyes: pupils equal, round and reactive to light; extraocular muscles intact Neck: supple Heart: regular rate and rhythm Lungs: Decreased air movement bilaterally without wheezing Abdomen: soft; nondistended; nontender; bowel sounds present Extremities: No deformity; full range of motion; pulses normal Neurologic: Awake, alert; motor function intact  in all extremities and symmetric; no facial droop Skin: Warm and dry Psychiatric: Flat affect   RESULTS  Summary of this visit's results, reviewed by myself:   EKG Interpretation  Date/Time:  Friday August 21 2017 22:03:26 EST Ventricular Rate:  97 PR Interval:  140 QRS Duration: 66 QT Interval:  344 QTC Calculation: 436 R Axis:   19 Text Interpretation:  Normal sinus rhythm Nonspecific ST and T wave abnormality Abnormal ECG No significant change was found Confirmed by Orlene Salmons, Jenny Reichmann [phone]) on 08/21/2017 10:59:21 PM      Laboratory Studies: Results for orders placed or performed during the hospital encounter of 08/21/17 (from the past 24 hour(s))  Basic metabolic panel     Status: Abnormal   Collection Time: 08/21/17 11:37 PM  Result Value Ref Range   Sodium 135 135 - 145 mmol/L   Potassium 3.9 3.5 - 5.1 mmol/L   Chloride 105 101 - 111 mmol/L   CO2 22 22 - 32 mmol/L   Glucose, Bld 224 (H) 65 - 99 mg/dL   BUN 13 6 - 20 mg/dL   Creatinine, Ser 0.59 0.44 - 1.00 mg/dL   Calcium 8.6 (L) 8.9 - 10.3 mg/dL   GFR calc non Af Amer >60 >60 mL/min   GFR calc Af Amer >60 >60 mL/min   Anion gap 8 5 - 15  CBC with Differential/Platelet     Status: Abnormal   Collection Time: 08/21/17 11:37 PM  Result Value Ref Range   WBC 9.1 4.0 - 10.5 K/uL   RBC 4.29 3.87 - 5.11 MIL/uL   Hemoglobin 10.9 (L) 12.0 - 15.0 g/dL   HCT 35.0 (L) 36.0 - 46.0 %   MCV 81.6 78.0 - 100.0 fL   MCH 25.4 (L) 26.0 - 34.0 pg   MCHC 31.1 30.0 - 36.0 g/dL   RDW 14.5 11.5 - 15.5 %   Platelets 291 150 - 400 K/uL   Neutrophils Relative % 51 %   Neutro Abs 4.6 1.7 - 7.7 K/uL   Lymphocytes Relative 34 %   Lymphs Abs 3.1 0.7 - 4.0 K/uL   Monocytes Relative 13 %   Monocytes Absolute 1.2 (H) 0.1 - 1.0 K/uL   Eosinophils Relative 2 %   Eosinophils Absolute 0.2 0.0 - 0.7 K/uL   Basophils Relative 0 %   Basophils Absolute 0.0 0.0 - 0.1 K/uL  Troponin I     Status: None   Collection Time: 08/21/17 11:37 PM  Result  Value Ref Range   Troponin I <0.03 <0.03 ng/mL   Imaging Studies: Dg Chest 2 View  Result Date: 08/22/2017 CLINICAL DATA:  Chest pain today.  Shortness of breath. EXAM: CHEST  2 VIEW COMPARISON:  Radiographs 08/12/2017 FINDINGS: The cardiomediastinal contours are normal. Increased bronchial coarsening above baseline. Subsegmental atelectasis is scarring at the left lung base. No consolidation, pleural effusion, or pneumothorax. No acute osseous abnormalities are seen. IMPRESSION: Mild bronchial thickening above  baseline may be infectious or inflammatory. Electronically Signed   By: Jeb Levering M.D.   On: 08/22/2017 00:21    ED COURSE  Nursing notes and initial vitals signs, including pulse oximetry, reviewed.  Vitals:   08/21/17 2202 08/21/17 2328  BP: (!) 186/94   Pulse: (!) 102   Resp: 18   Temp: 98.2 F (36.8 C)   TempSrc: Oral   SpO2: 100% 96%  Weight: 80.7 kg (178 lb)   Height: 5\' 2"  (1.575 m)    11:50 PM Patient's feels significantly better and air movement is improved after DuoNeb treatment.  12:53 AM Patient asymptomatic.  Lungs clear with good air movement.  Will start on albuterol inhaler.  Advised of elevated blood sugar.  Patient's niece states patient does not take her medications properly because she does not tolerate swallowing pills well.  She was advised to discuss the possibility of liquid formulations with her primary care physician.  PROCEDURES    ED DIAGNOSES     ICD-10-CM   1. Acute bronchospasm J98.01        Bree Heinzelman, Jenny Reichmann, MD 08/22/17 [phone]

## 2017-08-22 LAB — TROPONIN I: Troponin I: <0.03 ng/mL (ref <0.03)

## 2017-08-22 MED ORDER — METFORMIN HCL 500 MG/5ML PO SOLN: 500.0000 mg | Freq: Two times a day (BID) | ORAL | 0 refills | Status: DC

## 2017-08-22 MED ORDER — ALBUTEROL SULFATE HFA 108 (90 BASE) MCG/ACT IN AERS: 2.0000 | INHALATION_SPRAY | RESPIRATORY_TRACT | Status: DC | PRN

## 2017-08-26 ENCOUNTER — Encounter: Payer: Self-pay | Admitting: Cardiology

## 2017-08-26 ENCOUNTER — Ambulatory Visit (INDEPENDENT_AMBULATORY_CARE_PROVIDER_SITE_OTHER): Payer: Medicaid Other | Admitting: Cardiology

## 2017-08-26 VITALS — BP 124/70 | HR 74 | Ht 62.0 in | Wt 175.0 lb

## 2017-08-26 DIAGNOSIS — I10 Essential (primary) hypertension: Secondary | ICD-10-CM

## 2017-08-26 DIAGNOSIS — E088 Diabetes mellitus due to underlying condition with unspecified complications: Secondary | ICD-10-CM | POA: Diagnosis not present

## 2017-08-26 DIAGNOSIS — Z87891 Personal history of nicotine dependence: Secondary | ICD-10-CM | POA: Diagnosis not present

## 2017-08-26 DIAGNOSIS — R079 Chest pain, unspecified: Secondary | ICD-10-CM

## 2017-08-26 DIAGNOSIS — R0789 Other chest pain: Secondary | ICD-10-CM | POA: Insufficient documentation

## 2017-08-26 MED ORDER — NITROGLYCERIN 0.4 MG SL SUBL: 0.4000 mg | SUBLINGUAL_TABLET | SUBLINGUAL | 6 refills | Status: DC | PRN

## 2017-08-26 NOTE — Progress Notes (Signed)
Cardiology Office Note:    Date:  08/26/2017   ID:  Michale Weikel, DOB [birth year], MRN [id number]  PCP:  Dorena Dew, FNP  Cardiologist:  Jenean Lindau, MD   Referring MD: Dorena Dew, FNP    ASSESSMENT:    1. Chest pain, unspecified type   2. Essential hypertension   3. Diabetes mellitus due to underlying condition with complication, without long-term current use of insulin ([place])   4. Ex-smoker    PLAN:    In order of problems listed above:  1. Primary prevention stressed with the patient.  Importance of compliance with diet and medications stressed.  Sublingual nitroglycerin prescription was sent, its protocol and 911 protocol explained and the patient vocalized understanding questions were answered to the patient's satisfaction 2. Her symptoms are atypical for coronary etiology however in view of multiple risk factors I would like to get objective evidence with a stress test.  She tells me that she cannot walk on a treadmill so we will do a Lexiscan sestamibi. 3. Echocardiogram will be done to assess murmur heard on auscultation 4. Her blood pressure stable.  Lipids are followed by primary care physician.  Diet was discussed for dyslipidemia and diabetes mellitus. 5. Patient will be seen in follow-up appointment in 3 months or earlier if the patient has any concerns    Medication Adjustments/Labs and Tests Ordered: Current medicines are reviewed at length with the patient today.  Concerns regarding medicines are outlined above.  No orders of the defined types were placed in this encounter.  No orders of the defined types were placed in this encounter.    History of Present Illness:    Bailey Walker is a 71 y.o. female who is being seen today for the evaluation of chest pain at the request of Dorena Dew, FNP.  The patient is a pleasant 71 year old female.  The patient has past medical history of essential hypertension and diabetes mellitus.  She is  getting established with a primary care physician.  She is now here because of chest pain.  She says it is substernal burning in nature.  It is not related to exertion.  It is 4/10 at best and does not occur with exertion.  No orthopnea or PND.  No syncope or any other such problems.  She leads a sedentary lifestyle.  At the time of my evaluation she is alert awake oriented and in no distress.  She has not tried using any nitroglycerin for the symptoms.  The symptoms have been going on for the past 4-5 months.  Past Medical History:  Diagnosis Date  . Diabetes mellitus without complication ([place])   . Hypertension   . SVT (supraventricular tachycardia) ([place])   . Thyroid disease     Past Surgical History:  Procedure Laterality Date  . APPENDECTOMY    . KNEE SURGERY     left knee    Current Medications: Current Meds  Medication Sig  . Blood Pressure Monitoring (BLOOD PRESSURE MONITOR/M CUFF) MISC 1 each by Does not apply route 2 (two) times daily.  Marland Kitchen Dexlansoprazole (DEXILANT) 30 MG capsule Take 1 capsule (30 mg total) by mouth daily.  Marland Kitchen levothyroxine (SYNTHROID, LEVOTHROID) 75 MCG tablet TAKE 1 TABLET (75 MCG TOTAL) BY MOUTH DAILY BEFORE BREAKFAST.  Marland Kitchen Metoprolol Tartrate 37.5 MG TABS Take 37.5 mg by mouth 2 (two) times daily.     Allergies:   Ampicillin   Social History   Socioeconomic History  . Marital status:  Single    Spouse name: None  . Number of children: None  . Years of education: None  . Highest education level: None  Social Needs  . Financial resource strain: None  . Food insecurity - worry: None  . Food insecurity - inability: None  . Transportation needs - medical: None  . Transportation needs - non-medical: None  Occupational History  . None  Tobacco Use  . Smoking status: Former Smoker    Last attempt to quit: 12/29/2013    Years since quitting: 3.6  . Smokeless tobacco: Never Used  Substance and Sexual Activity  . Alcohol use: No  . Drug use: No  .  Sexual activity: None  Other Topics Concern  . None  Social History Narrative  . None     Family History: The patient's family history is not on file.  ROS:   Please see the history of present illness.    All other systems reviewed and are negative.  EKGs/Labs/Other Studies Reviewed:    The following studies were reviewed today: I reviewed records from previous doctor evaluations including blood work and EKG.  EKG reveals sinus rhythm with nonspecific ST-T changes.   Recent Labs: 02/10/2017: TSH 4.74 08/12/2017: ALT 13 08/21/2017: BUN 13; Creatinine, Ser 0.59; Hemoglobin 10.9; Platelets 291; Potassium 3.9; Sodium 135  Recent Lipid Panel No results found for: CHOL, TRIG, HDL, CHOLHDL, VLDL, LDLCALC, LDLDIRECT  Physical Exam:    VS:  BP 124/70 (BP Location: Left Arm, Patient Position: Sitting)   Pulse 74   Ht 5\' 2"  (1.575 m)   Wt 175 lb 0.6 oz (79.4 kg)   SpO2 94%   BMI 32.02 kg/m     Wt Readings from Last 3 Encounters:  08/26/17 175 lb 0.6 oz (79.4 kg)  08/21/17 178 lb (80.7 kg)  08/13/17 178 lb (80.7 kg)     GEN: Patient is in no acute distress HEENT: Normal NECK: No JVD; No carotid bruits LYMPHATICS: No lymphadenopathy CARDIAC: S1 S2 regular, 2/6 systolic murmur at the apex. RESPIRATORY:  Clear to auscultation without rales, wheezing or rhonchi  ABDOMEN: Soft, non-tender, non-distended MUSCULOSKELETAL:  No edema; No deformity  SKIN: Warm and dry NEUROLOGIC:  Alert and oriented x 3 PSYCHIATRIC:  Normal affect    Signed, Jenean Lindau, MD  08/26/2017 10:13 AM    [place]

## 2017-08-26 NOTE — Patient Instructions (Signed)
Medication Instructions:  Your physician has recommended you make the following change in your medication:  START Nitro as needed for chest pain  Labwork: None  Testing/Procedures: Your physician has requested that you have an echocardiogram. Echocardiography is a painless test that uses sound waves to create images of your heart. It provides your doctor with information about the size and shape of your heart and how well your heart's chambers and valves are working. This procedure takes approximately one hour. There are no restrictions for this procedure.  Your physician has requested that you have a lexiscan myoview. For further information please visit [email or web address]. Please follow instruction sheet, as given.  Follow-Up: Your physician recommends that you schedule a follow-up appointment in: 3 months   Any Other Special Instructions Will Be Listed Below (If Applicable).     If you need a refill on your cardiac medications before your next appointment, please call your pharmacy.   [place], RN, BSN   Echocardiogram An echocardiogram, or echocardiography, uses sound waves (ultrasound) to produce an image of your heart. The echocardiogram is simple, painless, obtained within a short period of time, and offers valuable information to your health care provider. The images from an echocardiogram can provide information such as:  Evidence of coronary artery disease (CAD).  Heart size.  Heart muscle function.  Heart valve function.  Aneurysm detection.  Evidence of a past heart attack.  Fluid buildup around the heart.  Heart muscle thickening.  Assess heart valve function.  Tell a health care provider about:  Any allergies you have.  All medicines you are taking, including vitamins, herbs, eye drops, creams, and over-the-counter medicines.  Any problems you or family members have had with anesthetic medicines.  Any blood disorders you  have.  Any surgeries you have had.  Any medical conditions you have.  Whether you are pregnant or may be pregnant. What happens before the procedure? No special preparation is needed. Eat and drink normally. What happens during the procedure?  In order to produce an image of your heart, gel will be applied to your chest and a wand-like tool (transducer) will be moved over your chest. The gel will help transmit the sound waves from the transducer. The sound waves will harmlessly bounce off your heart to allow the heart images to be captured in real-time motion. These images will then be recorded.  You may need an IV to receive a medicine that improves the quality of the pictures. What happens after the procedure? You may return to your normal schedule including diet, activities, and medicines, unless your health care provider tells you otherwise. This information is not intended to replace advice given to you by your health care provider. Make sure you discuss any questions you have with your health care provider. Document Released: 09/05/2000 Document Revised: 04/26/2016 Document Reviewed: 05/16/2013 Elsevier Interactive Patient Education  2017 [employer].

## 2017-08-27 ENCOUNTER — Telehealth (HOSPITAL_COMMUNITY): Payer: Self-pay | Admitting: *Deleted

## 2017-08-27 ENCOUNTER — Encounter: Payer: Self-pay | Admitting: Nurse Practitioner

## 2017-08-27 ENCOUNTER — Ambulatory Visit: Payer: Medicaid Other | Admitting: Nurse Practitioner

## 2017-08-27 VITALS — BP 118/70 | HR 82 | Ht 62.0 in | Wt 171.0 lb

## 2017-08-27 DIAGNOSIS — R079 Chest pain, unspecified: Secondary | ICD-10-CM

## 2017-08-27 DIAGNOSIS — K219 Gastro-esophageal reflux disease without esophagitis: Secondary | ICD-10-CM

## 2017-08-27 DIAGNOSIS — Z1211 Encounter for screening for malignant neoplasm of colon: Secondary | ICD-10-CM

## 2017-08-27 MED ORDER — RANITIDINE HCL 150 MG PO TABS: 150.0000 mg | ORAL_TABLET | Freq: Every day | ORAL | 3 refills | Status: DC

## 2017-08-27 NOTE — Telephone Encounter (Signed)
Patient's niece, Theone Stanley, given detailed instructions per Myocardial Perfusion Study Information Sheet for the test on  09/02/17 Patient notified to arrive 15 minutes early and that it is imperative to arrive on time for appointment to keep from having the test rescheduled.  If you need to cancel or reschedule your appointment, please call the office within 24 hours of your appointment. . Patient verbalized understanding. Kirstie Peri

## 2017-08-27 NOTE — Progress Notes (Signed)
Chief Complaint:  Chest pain.   HPI: Patient is a 71 yo non-English speaking female from [place], here with niece serving as interpreter / Nutritional therapist. Patient has a hx of hypothyroidism, HTN,  DM and GERD for which she has taken [place] for two years. Recently seen in ED three times. I reviewed the notes / labs / CXR.   In the Spring patient went to ED with palpitations and SOB, found to be in SVT by EMS. Later it was determined that she had run out of her beta blocker. Since then she has continued to have "little"episodes of burning pain in chest but differentiates it from heartburn.  Pain occurs mainly when she is hungry. She had another episode of significant pain associated with N/V on 11/21, went to [facility]. Low suspicion for ACS, dissection or PE  . Labs unrevealing. Mildly anemic. Probable fatty liver on ultrasound. CT scan suggested small to moderate hiatal hernia. Discharged from ED.   Patient went back to ED following day with anterior neck swelling.  Soft tissue CT scan of neck revealed submandibular sialadenitis without stone or abscess. Pharyngitis and supraglottic laryngitis with patent airway. Also seen was a suspicious 18 x 10 mm lobulated nodule in upper lobe of right lung. Given decadron for the swelling, antibiotics for pharyngitis/supraglottic infection, and advised to have repeat CT scan in 3 months.  She couldn't tolerate the antibiotics , caused shakiness.   Patient went back to ED on 11/30 for epigastric / chest pain. Felt like she couldn't catch her breath.  No significant change on EKG. Mild bronchial thickening on CXR. Troponin negative. Labs unchanged. Discharged home.   Patient gets SOB when moving around at home but the chest pain is not related to activity. She has no lower GI complaints. Apparently someone has talked with her about a screening colonoscopy. Patient says she is currently on about ability to complete a bowel prep  CT scan IMPRESSION: 1. Hepatic  steatosis. Faint vascular blush in the right hepatic lobe is nonspecific but may represent a capillary hemangioma or possibly small vascular malformation. 2. 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 3. Coronary arteriosclerosis and aortic atherosclerosis. 4. Small to moderate-sized hiatal hernia which may account for the patient's lower chest and epigastric pain.  CT scan soft tissue neck IMPRESSION: 1. Submandibular sialadenitis without stone or abscess. Pharyngitis and supraglottic laryngitis with patent airway. A viral process could explain both of these findings. 2. Suspicious 18 x 10 mm lobulated nodule in the right upper lobe. Consider one of the following in 3 months for both low-risk and high-risk individuals: (a) repeat chest CT, (b) follow-up PET-CT, or (c) tissue sampling. This recommendation follows the consensus statement: Guidelines for Management of Incidental Pulmonary Nodules Detected on CT Images: From the Fleischner Society 2017; Radiology 2017; 284:228-243.  Past Medical History:  Diagnosis Date  . Diabetes mellitus without complication ([place])   . Hypertension   . SVT (supraventricular tachycardia) ([place])   . Thyroid disease      Past Surgical History:  Procedure Laterality Date  . APPENDECTOMY    . fatty gland     left wrist , right breast  . KNEE SURGERY     left knee   Family History  Problem Relation Age of Onset  . Breast cancer Cousin        mat and pat sides  . Colon cancer Neg Hx   . Esophageal cancer Neg Hx   . Rectal cancer Neg Hx  Social History   Tobacco Use  . Smoking status: Former Smoker    Last attempt to quit: 12/29/2013    Years since quitting: 3.6  . Smokeless tobacco: Never Used  Substance Use Topics  . Alcohol use: No  . Drug use: No   Current Outpatient Medications  Medication Sig Dispense Refill  . Blood Pressure Monitoring (BLOOD PRESSURE MONITOR/M CUFF) MISC 1 each by Does not apply route 2 (two) times  daily. 1 each 0  . Dexlansoprazole (DEXILANT) 30 MG capsule Take 1 capsule (30 mg total) by mouth daily. 30 capsule 0  . levothyroxine (SYNTHROID, LEVOTHROID) 75 MCG tablet TAKE 1 TABLET (75 MCG TOTAL) BY MOUTH DAILY BEFORE BREAKFAST. 60 tablet 0  . Metoprolol Tartrate 37.5 MG TABS Take 37.5 mg by mouth 2 (two) times daily. 60 tablet 0  . nitroGLYCERIN (NITROSTAT) 0.4 MG SL tablet Place 1 tablet (0.4 mg total) under the tongue every 5 (five) minutes as needed for chest pain. (Patient not taking: Reported on 08/27/2017) 11 tablet 6   No current facility-administered medications for this visit.    Allergies  Allergen Reactions  . Ampicillin Other (See Comments)    Per allergy test     Review of Systems: Positive for anxiety, arthritis, back pain, cough, fatigue, headaches, heart rhythm changes, night sweats, shortness of breath, sleeping problems, excessive thirst  All other systems reviewed and negative except where noted in HPI.    Physical Exam: BP 118/70   Pulse 82   Ht 5\' 2"  (1.575 m)   Wt 171 lb (77.6 kg)   BMI 31.28 kg/m  Constitutional:  Well-developed, female in no acute distress. Psychiatric: Normal mood and affect. Behavior is normal. EENT: Pupils normal.  Conjunctivae are normal. No scleral icterus. Neck supple.  Cardiovascular: Normal rate, regular rhythm. No edema Pulmonary/chest: Effort normal and breath sounds normal. No wheezing, rales or rhonchi. Abdominal: Soft, nondistended. Nontender. Bowel sounds active throughout. There are no masses palpable. No hepatomegaly. Lymphadenopathy: No cervical adenopathy noted. Neurological: Alert and oriented to person place and time. Skin: Skin is warm and dry. No rashes noted.   ASSESSMENT AND PLAN:  30. 71 year old female, non-English-speaking, from [place] here for evaluation of intermittent chest pain. Pain is not exertional, it often occurs when hungry. She is on chronic PPI therapy. She also describes SOB, mainly related  to activity . Saw Cardiology yesterday, pain felt to be atypical for CAD but scheduled for echo and Lexiscan .  -Niece believes patient tried Pepcid but it made her feel weak. She seems to be intolerant of several meds. We discussed adding an H2 blocker at bedtime and she is agreeable to this. Start Zantac 150 mg daily at bedtime .  -Anti-reflux measures discussed, GERD literature given  -Niece has been taking patient to ED for acute problems. I encouraged her to reconnect with [facility] who can help direct her care.  -The RUL lung mass found in the ED will need re-imaging in a few months. Another reason why she needs a PCP managing her care.  -Folllow up with me after stress test. If cardiac workup negative and still symptomatic then will proceed with an EGD as we discussed today.   2. DM. She can't tolerate metformin pills. I asked niece to call [facility] about alternative.   3. RUL lung mass, see #1.   4. Colon cancer screening. No GI complaints. She is mildly anemic, no overt bleeding. Patient concerned / anxious about a bowel  prep.  -Screening colonoscopy is not pressing but we could possibly do it at same time as EGD (if she ends of needing one).    Tye Savoy, NP  08/27/2017, 9:07 AM  Cc: Dorena Dew, FNP

## 2017-08-27 NOTE — Patient Instructions (Addendum)
If you are age 73 or older, your body mass index should be between 23-30. Your Body mass index is 31.28 kg/m. If this is out of the aforementioned range listed, please consider follow up with your Primary Care Provider.  If you are age 64 or younger, your body mass index should be between 19-25. Your Body mass index is 31.28 kg/m. If this is out of the aformentioned range listed, please consider follow up with your Primary Care Provider.   We have sent the following medications to your pharmacy for you to pick up at your convenience: Zantac 150 mg  Continue Dexilant.  You have been given GERD literature.  Follow up with Tye Savoy, NP in three weeks.  Will contact you with an appointment when the schedule becomes available.  Thank you for choosing me and [facility].   Tye Savoy, NP

## 2017-08-28 NOTE — Progress Notes (Signed)
Agree with assessment and plan as outlined. Await cardiac workup first. If negative and symptoms persist, can proceed with EGD and screening colonoscopy.

## 2017-09-02 ENCOUNTER — Telehealth (HOSPITAL_COMMUNITY): Payer: Self-pay | Admitting: *Deleted

## 2017-09-02 ENCOUNTER — Other Ambulatory Visit (HOSPITAL_COMMUNITY): Payer: Medicaid Other

## 2017-09-02 ENCOUNTER — Encounter (HOSPITAL_COMMUNITY): Payer: Medicaid Other

## 2017-09-07 NOTE — Telephone Encounter (Signed)
Left message on voicemail in reference to upcoming appointment scheduled for 09/09/17. Phone number given for a call back so details instructions can be given. Kirstie Peri

## 2017-09-08 ENCOUNTER — Telehealth (HOSPITAL_COMMUNITY): Payer: Self-pay | Admitting: *Deleted

## 2017-09-08 NOTE — Telephone Encounter (Signed)
Patient given detailed instructions per Myocardial Perfusion Study Information Sheet for the test on 09/09/17 at 0745. Patient notified to arrive 15 minutes early and that it is imperative to arrive on time for appointment to keep from having the test rescheduled.  If you need to cancel or reschedule your appointment, please call the office within 24 hours of your appointment. . Patient verbalized understanding.Amiree No, Ranae [place]ms

## 2017-09-09 ENCOUNTER — Ambulatory Visit (HOSPITAL_COMMUNITY): Payer: Medicaid Other | Attending: Internal Medicine

## 2017-09-09 ENCOUNTER — Other Ambulatory Visit: Payer: Self-pay

## 2017-09-09 ENCOUNTER — Ambulatory Visit (HOSPITAL_BASED_OUTPATIENT_CLINIC_OR_DEPARTMENT_OTHER): Payer: Medicaid Other

## 2017-09-09 DIAGNOSIS — I10 Essential (primary) hypertension: Secondary | ICD-10-CM

## 2017-09-09 DIAGNOSIS — R079 Chest pain, unspecified: Secondary | ICD-10-CM | POA: Diagnosis not present

## 2017-09-09 LAB — MYOCARDIAL PERFUSION IMAGING
LV dias vol: 61 mL (ref 46–106)
LV sys vol: 19 mL
Peak HR: 108 {beats}/min
RATE: 0.37
Rest HR: 65 {beats}/min
SDS: 0
SRS: 0
SSS: 0
TID: 0.86

## 2017-09-09 LAB — ECHOCARDIOGRAM COMPLETE
Height: 62 in
Weight: 2800 oz

## 2017-09-09 MED ORDER — REGADENOSON 0.4 MG/5ML IV SOLN
0.4000 mg | Freq: Once | INTRAVENOUS | Status: AC
  Administered 2017-09-09: 0.4 mg via INTRAVENOUS

## 2017-09-09 MED ORDER — TECHNETIUM TC 99M TETROFOSMIN IV KIT
10.6000 | PACK | Freq: Once | INTRAVENOUS | Status: AC | PRN
  Administered 2017-09-09: 10.6 via INTRAVENOUS
  Filled 2017-09-09: qty 11

## 2017-09-09 MED ORDER — TECHNETIUM TC 99M TETROFOSMIN IV KIT
32.6000 | PACK | Freq: Once | INTRAVENOUS | Status: AC | PRN
  Administered 2017-09-09: 32.6 via INTRAVENOUS
  Filled 2017-09-09: qty 33

## 2017-09-09 NOTE — Progress Notes (Unsigned)
Patient's niece [name]) here to interpret. Language line used for patient to give permission for interpretation. Kirstie Peri

## 2017-09-11 ENCOUNTER — Other Ambulatory Visit: Payer: Self-pay | Admitting: Family Medicine

## 2017-09-11 DIAGNOSIS — I10 Essential (primary) hypertension: Secondary | ICD-10-CM

## 2017-09-25 ENCOUNTER — Encounter: Payer: Self-pay | Admitting: Nurse Practitioner

## 2017-09-26 ENCOUNTER — Other Ambulatory Visit: Payer: Self-pay | Admitting: Family Medicine

## 2017-09-26 DIAGNOSIS — E039 Hypothyroidism, unspecified: Secondary | ICD-10-CM

## 2017-09-28 ENCOUNTER — Other Ambulatory Visit: Payer: Self-pay

## 2017-09-28 ENCOUNTER — Other Ambulatory Visit: Payer: Self-pay | Admitting: Family Medicine

## 2017-09-28 DIAGNOSIS — E039 Hypothyroidism, unspecified: Secondary | ICD-10-CM

## 2017-09-28 MED ORDER — LEVOTHYROXINE SODIUM 75 MCG PO TABS: 75.0000 ug | ORAL_TABLET | Freq: Every day | ORAL | 0 refills | Status: DC

## 2017-10-01 ENCOUNTER — Ambulatory Visit: Payer: Medicaid Other | Admitting: Family Medicine

## 2017-10-05 ENCOUNTER — Other Ambulatory Visit: Payer: Self-pay | Admitting: Family Medicine

## 2017-10-05 DIAGNOSIS — I10 Essential (primary) hypertension: Secondary | ICD-10-CM

## 2017-10-08 ENCOUNTER — Ambulatory Visit: Payer: Medicaid Other | Admitting: Family Medicine

## 2017-10-08 ENCOUNTER — Encounter: Payer: Self-pay | Admitting: Family Medicine

## 2017-10-08 VITALS — BP 129/65 | HR 87 | Temp 98.1°F | Resp 16 | Ht 62.0 in | Wt 176.0 lb

## 2017-10-08 DIAGNOSIS — E119 Type 2 diabetes mellitus without complications: Secondary | ICD-10-CM

## 2017-10-08 DIAGNOSIS — Z1239 Encounter for other screening for malignant neoplasm of breast: Secondary | ICD-10-CM

## 2017-10-08 DIAGNOSIS — R911 Solitary pulmonary nodule: Secondary | ICD-10-CM | POA: Diagnosis not present

## 2017-10-08 DIAGNOSIS — E088 Diabetes mellitus due to underlying condition with unspecified complications: Secondary | ICD-10-CM | POA: Diagnosis not present

## 2017-10-08 DIAGNOSIS — Z1211 Encounter for screening for malignant neoplasm of colon: Secondary | ICD-10-CM

## 2017-10-08 DIAGNOSIS — R918 Other nonspecific abnormal finding of lung field: Secondary | ICD-10-CM | POA: Diagnosis not present

## 2017-10-08 DIAGNOSIS — Z1231 Encounter for screening mammogram for malignant neoplasm of breast: Secondary | ICD-10-CM | POA: Diagnosis not present

## 2017-10-08 DIAGNOSIS — R8290 Unspecified abnormal findings in urine: Secondary | ICD-10-CM | POA: Diagnosis not present

## 2017-10-08 LAB — POCT URINALYSIS DIP (DEVICE)
Bilirubin Urine: NEGATIVE
Glucose, UA: NEGATIVE mg/dL
Hgb urine dipstick: NEGATIVE
Ketones, ur: NEGATIVE mg/dL
Nitrite: NEGATIVE
Protein, ur: NEGATIVE mg/dL
Specific Gravity, Urine: 1.015 (ref 1.005–1.030)
Urobilinogen, UA: 0.2 mg/dL (ref 0.0–1.0)
pH: 5.5 (ref 5.0–8.0)

## 2017-10-08 LAB — POCT GLYCOSYLATED HEMOGLOBIN (HGB A1C): Hemoglobin A1C: 7.6

## 2017-10-08 NOTE — Progress Notes (Signed)
Chief Complaint  Patient presents with  . Hypertension  . Diabetes    Subjective:    Patient ID: Bailey Walker, female    DOB: 12-04-1945, 72 y.o.   MRN: [id number]  HPI Bailey Walker, a 72 year old female presents accompanied by niece for a 1 month follow up of chronic conditions. Patient primarily speaks Aramaic and her niece will be interpreting to assist with communication. Patient was evaluated in the emergency department on 08/13/2017 for neck swelling. She underwent a  CT, which showed a suspicious 18 X 10 mm lobulated nodule in the right upper lung. A repeat chest CT is recommended in 3 months.    Patient has a history of hypertension, DMII, and hypothyroidism.  Most recent hemoglobin a1c was 7.9. Patient was started on Metformin 500 mg BID and a carbohydrate modified diet. She says that she has been taking medications consistently. She denies fatigue, polydipsia, polyphagia, or polyuria.  Patient has a history of hypertension.  Blood pressure has been controlled on Metoprolol 37.5 mg daily. Patient denies chest pain, chest pressure/discomfort, dyspnea, fatigue, irregular heart beat, lower extremity edema, orthopnea, palpitations and tachypnea.  Cardiovascular risk factors: obesity (BMI >= 30 kg/m2) and sedentary lifestyle.   Patient presents for evaluation of thyroid function. Symptoms have been controlled on current medication regimen.   Past Medical History:  Diagnosis Date  . Diabetes mellitus without complication ([name])   . Hypertension   . SVT (supraventricular tachycardia) ([place])   . Thyroid disease    Social History   Socioeconomic History  . Marital status: Single    Spouse name: Not on file  . Number of children: 0  . Years of education: Not on file  . Highest education level: Not on file  Social Needs  . Financial resource strain: Not on file  . Food insecurity - worry: Not on file  . Food insecurity - inability: Not on file  . Transportation needs - medical: Not  on file  . Transportation needs - non-medical: Not on file  Occupational History  . Not on file  Tobacco Use  . Smoking status: Former Smoker    Last attempt to quit: 12/29/2013    Years since quitting: 3.7  . Smokeless tobacco: Never Used  Substance and Sexual Activity  . Alcohol use: No  . Drug use: No  . Sexual activity: Not on file  Other Topics Concern  . Not on file  Social History Narrative  . Not on file   Review of Systems  Constitutional: Positive for fatigue and unexpected weight change.  HENT: Negative.  Negative for sinus pressure and sinus pain.   Eyes: Negative.  Negative for redness.  Respiratory: Negative.   Cardiovascular: Negative for chest pain, palpitations and leg swelling.  Gastrointestinal: Negative.   Endocrine: Negative for cold intolerance, heat intolerance, polydipsia and polyphagia.  Musculoskeletal: Negative.   Skin: Negative.   Allergic/Immunologic: Negative.   Neurological: Negative.   Hematological: Negative.   Psychiatric/Behavioral: Negative.         Objective:   Physical Exam  Constitutional: She appears well-developed and well-nourished.  HENT:  Head: Normocephalic and atraumatic.  Right Ear: External ear normal.  Left Ear: External ear normal.  Nose: Nose normal.  Mouth/Throat: Oropharynx is clear and moist.  Eyes: Conjunctivae and EOM are normal. Pupils are equal, round, and reactive to light.  Neck: Normal range of motion. Neck supple.  Cardiovascular: Normal rate, regular rhythm, normal heart sounds and intact distal pulses.  No  murmur heard. Pulmonary/Chest: Effort normal and breath sounds normal. No respiratory distress. She has no wheezes. She has no rales. She exhibits no tenderness.  Abdominal: Soft. Bowel sounds are normal.  Musculoskeletal: Normal range of motion.  Neurological: She is alert. She has normal reflexes. No cranial nerve deficit. Coordination normal.  Skin: Skin is warm and dry.  Psychiatric: She has a  normal mood and affect. Her behavior is normal. Judgment and thought content normal.     BP 129/65 (BP Location: Left Arm, Patient Position: Sitting, Cuff Size: Normal)   Pulse 87   Temp 98.1 F (36.7 C) (Oral)   Resp 16   Ht 5\' 2"  (1.575 m)   Wt 176 lb (79.8 kg)   SpO2 98%   BMI 32.19 kg/m  Assessment & Plan:  1. Diabetes mellitus due to underlying condition with complication, without long-term current use of insulin (HCC) Hemoglobin a1C is 7.6 on current medication regimen.  Your A1C goal is less than 7. Your fasting blood sugar  Upon awakening goal is between 110-140.  Blood pressure goal is <140/90.  Recommend a lowfat, low carbohydrate diet divided over 5-6 small meals, increase water intake to 6-8 glasses, and 150 minutes per week of cardiovascular exercise.  - HgB A1c - TSH - Basic Metabolic Panel - Ambulatory referral to Ophthalmology  2. Abnormal urinalysis - Urine Culture  3. Colon cancer screening Patient recently referred for routine colonoscopy.   4. Breast cancer screening - MM Digital Screening; Future  5. Nodule of right lung - CT CHEST NODULE FOLLOW UP LOW DOSE W/O; Future  6. Abnormal findings on diagnostic imaging of lung  CHEST NODULE FOLLOW UP LOW DOSE W/O; Future Repeat CTscan  IMPRESSION: 1. Submandibular sialadenitis without stone or abscess. Pharyngitis and supraglottic laryngitis with patent airway. A viral process could explain both of these findings. 2. Suspicious 18 x 10 mm lobulated nodule in the right upper lobe. Consider one of the following in 3 months for both low-risk and high-risk individuals: (a) repeat chest CT, (b) follow-up PET-CT, or (c) tissue sampling. This recommendation follows the consensus statement: Guidelines for Management of Incidental Pulmonary Nodules Detected on CT Images: From the Fleischner Society 2017; Radiology 2017; 284:228-243.  RTC: 3 months for chronic conditions   [place]  MSN,  FNP-C [facility] [address] [place], [place] [address] [phone]

## 2017-10-09 LAB — BASIC METABOLIC PANEL
BUN/Creatinine Ratio: 20 (ref 12–28)
BUN: 14 mg/dL (ref 8–27)
CO2: 22 mmol/L (ref 20–29)
Calcium: 9.1 mg/dL (ref 8.7–10.3)
Chloride: 99 mmol/L (ref 96–106)
Creatinine, Ser: 0.71 mg/dL (ref 0.57–1.00)
GFR calc Af Amer: 98 mL/min/{1.73_m2} (ref >59)
GFR calc non Af Amer: 85 mL/min/{1.73_m2} (ref >59)
Glucose: 233 mg/dL — ABNORMAL HIGH (ref 65–99)
Potassium: 4.5 mmol/L (ref 3.5–5.2)
Sodium: 137 mmol/L (ref 134–144)

## 2017-10-09 LAB — TSH: TSH: 3.3 u[IU]/mL (ref 0.450–4.500)

## 2017-10-10 LAB — URINE CULTURE

## 2017-10-11 NOTE — Patient Instructions (Signed)
Will schedule follow up after reviewing chest CT.   Will follow up by phone after reviewing laboratory results  Hemoglobin a1C is 7.6, which is above goal. Will continue current medication regimen. Also, recommend a carbohydrate modified diet divided over 5-6 small meals throughout the day   Diabetes Mellitus and Nutrition When you have diabetes (diabetes mellitus), it is very important to have healthy eating habits because your blood sugar (glucose) levels are greatly affected by what you eat and drink. Eating healthy foods in the appropriate amounts, at about the same times every day, can help you:  Control your blood glucose.  Lower your risk of heart disease.  Improve your blood pressure.  Reach or maintain a healthy weight.  Every person with diabetes is different, and each person has different needs for a meal plan. Your health care provider may recommend that you work with a diet and nutrition specialist (dietitian) to make a meal plan that is best for you. Your meal plan may vary depending on factors such as:  The calories you need.  The medicines you take.  Your weight.  Your blood glucose, blood pressure, and cholesterol levels.  Your activity level.  Other health conditions you have, such as heart or kidney disease.  How do carbohydrates affect me? Carbohydrates affect your blood glucose level more than any other type of food. Eating carbohydrates naturally increases the amount of glucose in your blood. Carbohydrate counting is a method for keeping track of how many carbohydrates you eat. Counting carbohydrates is important to keep your blood glucose at a healthy level, especially if you use insulin or take certain oral diabetes medicines. It is important to know how many carbohydrates you can safely have in each meal. This is different for every person. Your dietitian can help you calculate how many carbohydrates you should have at each meal and for snack. Foods that  contain carbohydrates include:  Bread, cereal, rice, pasta, and crackers.  Potatoes and corn.  Peas, beans, and lentils.  Milk and yogurt.  Fruit and juice.  Desserts, such as cakes, cookies, ice cream, and candy.  How does alcohol affect me? Alcohol can cause a sudden decrease in blood glucose (hypoglycemia), especially if you use insulin or take certain oral diabetes medicines. Hypoglycemia can be a life-threatening condition. Symptoms of hypoglycemia (sleepiness, dizziness, and confusion) are similar to symptoms of having too much alcohol. If your health care provider says that alcohol is safe for you, follow these guidelines:  Limit alcohol intake to no more than 1 drink per day for nonpregnant women and 2 drinks per day for men. One drink equals 12 oz of beer, 5 oz of wine, or 1 oz of hard liquor.  Do not drink on an empty stomach.  Keep yourself hydrated with water, diet soda, or unsweetened iced tea.  Keep in mind that regular soda, juice, and other mixers may contain a lot of sugar and must be counted as carbohydrates.  What are tips for following this plan? Reading food labels  Start by checking the serving size on the label. The amount of calories, carbohydrates, fats, and other nutrients listed on the label are based on one serving of the food. Many foods contain more than one serving per package.  Check the total grams (g) of carbohydrates in one serving. You can calculate the number of servings of carbohydrates in one serving by dividing the total carbohydrates by 15. For example, if a food has 30 g of total carbohydrates,  it would be equal to 2 servings of carbohydrates.  Check the number of grams (g) of saturated and trans fats in one serving. Choose foods that have low or no amount of these fats.  Check the number of milligrams (mg) of sodium in one serving. Most people should limit total sodium intake to less than 2,300 mg per day.  Always check the nutrition  information of foods labeled as "low-fat" or "nonfat". These foods may be higher in added sugar or refined carbohydrates and should be avoided.  Talk to your dietitian to identify your daily goals for nutrients listed on the label. Shopping  Avoid buying canned, premade, or processed foods. These foods tend to be high in fat, sodium, and added sugar.  Shop around the outside edge of the grocery store. This includes fresh fruits and vegetables, bulk grains, fresh meats, and fresh dairy. Cooking  Use low-heat cooking methods, such as baking, instead of high-heat cooking methods like deep frying.  Cook using healthy oils, such as olive, canola, or sunflower oil.  Avoid cooking with butter, cream, or high-fat meats. Meal planning  Eat meals and snacks regularly, preferably at the same times every day. Avoid going long periods of time without eating.  Eat foods high in fiber, such as fresh fruits, vegetables, beans, and whole grains. Talk to your dietitian about how many servings of carbohydrates you can eat at each meal.  Eat 4-6 ounces of lean protein each day, such as lean meat, chicken, fish, eggs, or tofu. 1 ounce is equal to 1 ounce of meat, chicken, or fish, 1 egg, or 1/4 cup of tofu.  Eat some foods each day that contain healthy fats, such as avocado, nuts, seeds, and fish. Lifestyle   Check your blood glucose regularly.  Exercise at least 30 minutes 5 or more days each week, or as told by your health care provider.  Take medicines as told by your health care provider.  Do not use any products that contain nicotine or tobacco, such as cigarettes and e-cigarettes. If you need help quitting, ask your health care provider.  Work with a Social worker or diabetes educator to identify strategies to manage stress and any emotional and social challenges. What are some questions to ask my health care provider?  Do I need to meet with a diabetes educator?  Do I need to meet with a  dietitian?  What number can I call if I have questions?  When are the best times to check my blood glucose? Where to find more information:  American Diabetes Association: diabetes.org/food-and-fitness/food  Academy of Nutrition and Dietetics: PokerClues.dk  [employer] of Diabetes and Digestive and Kidney Diseases (NIH): ContactWire.be Summary  A healthy meal plan will help you control your blood glucose and maintain a healthy lifestyle.  Working with a diet and nutrition specialist (dietitian) can help you make a meal plan that is best for you.  Keep in mind that carbohydrates and alcohol have immediate effects on your blood glucose levels. It is important to count carbohydrates and to use alcohol carefully. This information is not intended to replace advice given to you by your health care provider. Make sure you discuss any questions you have with your health care provider. Document Released: 06/05/2005 Document Revised: 10/13/2016 Document Reviewed: 10/13/2016 Elsevier Interactive Patient Education  [employer].

## 2017-10-14 ENCOUNTER — Other Ambulatory Visit: Payer: Self-pay | Admitting: Family Medicine

## 2017-10-14 DIAGNOSIS — R918 Other nonspecific abnormal finding of lung field: Secondary | ICD-10-CM

## 2017-10-27 ENCOUNTER — Other Ambulatory Visit: Payer: Self-pay | Admitting: Family Medicine

## 2017-10-27 DIAGNOSIS — E039 Hypothyroidism, unspecified: Secondary | ICD-10-CM

## 2017-11-04 ENCOUNTER — Other Ambulatory Visit: Payer: Self-pay | Admitting: Family Medicine

## 2017-11-04 ENCOUNTER — Other Ambulatory Visit: Payer: Self-pay

## 2017-11-04 DIAGNOSIS — I10 Essential (primary) hypertension: Secondary | ICD-10-CM

## 2017-11-04 DIAGNOSIS — Z1231 Encounter for screening mammogram for malignant neoplasm of breast: Secondary | ICD-10-CM

## 2017-11-04 MED ORDER — METOPROLOL TARTRATE 37.5 MG PO TABS: 37.5000 mg | ORAL_TABLET | Freq: Two times a day (BID) | ORAL | 3 refills | Status: DC

## 2017-11-09 ENCOUNTER — Encounter: Payer: Self-pay | Admitting: Family Medicine

## 2017-11-09 ENCOUNTER — Ambulatory Visit: Payer: Medicaid Other | Admitting: Family Medicine

## 2017-11-09 VITALS — BP 142/72 | HR 98 | Temp 98.4°F | Resp 16 | Ht 62.0 in | Wt 178.0 lb

## 2017-11-09 DIAGNOSIS — R42 Dizziness and giddiness: Secondary | ICD-10-CM

## 2017-11-09 DIAGNOSIS — I10 Essential (primary) hypertension: Secondary | ICD-10-CM | POA: Diagnosis not present

## 2017-11-09 DIAGNOSIS — M79675 Pain in left toe(s): Secondary | ICD-10-CM

## 2017-11-09 DIAGNOSIS — R8290 Unspecified abnormal findings in urine: Secondary | ICD-10-CM | POA: Diagnosis not present

## 2017-11-09 DIAGNOSIS — G44209 Tension-type headache, unspecified, not intractable: Secondary | ICD-10-CM

## 2017-11-09 DIAGNOSIS — M79674 Pain in right toe(s): Secondary | ICD-10-CM | POA: Diagnosis not present

## 2017-11-09 DIAGNOSIS — J3089 Other allergic rhinitis: Secondary | ICD-10-CM

## 2017-11-09 DIAGNOSIS — B351 Tinea unguium: Secondary | ICD-10-CM | POA: Diagnosis not present

## 2017-11-09 LAB — POCT URINALYSIS DIP (DEVICE)
Bilirubin Urine: NEGATIVE
Glucose, UA: NEGATIVE mg/dL
Ketones, ur: NEGATIVE mg/dL
Nitrite: NEGATIVE
Protein, ur: NEGATIVE mg/dL
Specific Gravity, Urine: 1.025 (ref 1.005–1.030)
Urobilinogen, UA: 0.2 mg/dL (ref 0.0–1.0)
pH: 5.5 (ref 5.0–8.0)

## 2017-11-09 MED ORDER — KETOROLAC TROMETHAMINE 30 MG/ML IJ SOLN
30.0000 mg | Freq: Once | INTRAMUSCULAR | Status: AC
  Administered 2017-11-09: 30 mg via INTRAMUSCULAR

## 2017-11-09 MED ORDER — ACETAMINOPHEN 500 MG PO TABS: 500.0000 mg | ORAL_TABLET | Freq: Four times a day (QID) | ORAL | 0 refills | Status: DC | PRN

## 2017-11-09 MED ORDER — CETIRIZINE HCL 10 MG PO TABS: 10.0000 mg | ORAL_TABLET | Freq: Every day | ORAL | 11 refills | Status: DC

## 2017-11-09 NOTE — Patient Instructions (Signed)
Will follow up by phone with abnormal lab results For toenail thickening, will send a referral to podiatry Will continue Metoprolol 37.5 mg twice daily for essential hypertension  For headache, recommend Tylenol 500 mg every 8 hours as needed for mild to moderate headache pain  You have allergic rhinitis. There is clear fluid behind left ear. Will start a trial of Cetirizine 10 mg at bedtime.     General Headache Without Cause A headache is pain or discomfort felt around the head or neck area. There are many causes and types of headaches. In some cases, the cause may not be found. Follow these instructions at home: Managing pain  Take over-the-counter and prescription medicines only as told by your doctor.  Lie down in a dark, quiet room when you have a headache.  If directed, apply ice to the head and neck area: ? Put ice in a plastic bag. ? Place a towel between your skin and the bag. ? Leave the ice on for 20 minutes, 2-3 times per day.  Use a heating pad or hot shower to apply heat to the head and neck area as told by your doctor.  Keep lights dim if bright lights bother you or make your headaches worse. Eating and drinking  Eat meals on a regular schedule.  Lessen how much alcohol you drink.  Lessen how much caffeine you drink, or stop drinking caffeine. General instructions  Keep all follow-up visits as told by your doctor. This is important.  Keep a journal to find out if certain things bring on headaches. For example, write down: ? What you eat and drink. ? How much sleep you get. ? Any change to your diet or medicines.  Relax by getting a massage or doing other relaxing activities.  Lessen stress.  Sit up straight. Do not tighten (tense) your muscles.  Do not use tobacco products. This includes cigarettes, chewing tobacco, or e-cigarettes. If you need help quitting, ask your doctor.  Exercise regularly as told by your doctor.  Get enough sleep. This often  means 7-9 hours of sleep. Contact a doctor if:  Your symptoms are not helped by medicine.  You have a headache that feels different than the other headaches.  You feel sick to your stomach (nauseous) or you throw up (vomit).  You have a fever. Get help right away if:  Your headache becomes really bad.  You keep throwing up.  You have a stiff neck.  You have trouble seeing.  You have trouble speaking.  You have pain in the eye or ear.  Your muscles are weak or you lose muscle control.  You lose your balance or have trouble walking.  You feel like you will pass out (faint) or you pass out.  You have confusion. This information is not intended to replace advice given to you by your health care provider. Make sure you discuss any questions you have with your health care provider. Document Released: 06/17/2008 Document Revised: 02/14/2016 Document Reviewed: 01/01/2015 Elsevier Interactive Patient Education  [employer].

## 2017-11-10 ENCOUNTER — Telehealth: Payer: Self-pay

## 2017-11-10 LAB — CBC WITH DIFFERENTIAL/PLATELET
Basophils Absolute: 0 10*3/uL (ref 0.0–0.2)
Basos: 1 %
EOS (ABSOLUTE): 0.1 10*3/uL (ref 0.0–0.4)
Eos: 1 %
Hematocrit: 36.2 % (ref 34.0–46.6)
Hemoglobin: 11.5 g/dL (ref 11.1–15.9)
Immature Grans (Abs): 0 10*3/uL (ref 0.0–0.1)
Immature Granulocytes: 0 %
Lymphocytes Absolute: 2.5 10*3/uL (ref 0.7–3.1)
Lymphs: 30 %
MCH: 24.6 pg — ABNORMAL LOW (ref 26.6–33.0)
MCHC: 31.8 g/dL (ref 31.5–35.7)
MCV: 77 fL — ABNORMAL LOW (ref 79–97)
Monocytes Absolute: 0.8 10*3/uL (ref 0.1–0.9)
Monocytes: 10 %
Neutrophils Absolute: 4.8 10*3/uL (ref 1.4–7.0)
Neutrophils: 58 %
Platelets: 277 10*3/uL (ref 150–379)
RBC: 4.68 x10E6/uL (ref 3.77–5.28)
RDW: 15.2 % (ref 12.3–15.4)
WBC: 8.3 10*3/uL (ref 3.4–10.8)

## 2017-11-10 LAB — BASIC METABOLIC PANEL
BUN/Creatinine Ratio: 17 (ref 12–28)
BUN: 11 mg/dL (ref 8–27)
CO2: 22 mmol/L (ref 20–29)
Calcium: 9.3 mg/dL (ref 8.7–10.3)
Chloride: 101 mmol/L (ref 96–106)
Creatinine, Ser: 0.66 mg/dL (ref 0.57–1.00)
GFR calc Af Amer: 101 mL/min/{1.73_m2} (ref >59)
GFR calc non Af Amer: 88 mL/min/{1.73_m2} (ref >59)
Glucose: 112 mg/dL — ABNORMAL HIGH (ref 65–99)
Potassium: 4.1 mmol/L (ref 3.5–5.2)
Sodium: 139 mmol/L (ref 134–144)

## 2017-11-10 NOTE — Telephone Encounter (Signed)
Called and spoke with patient's niece. Advised that all labs are consistent with baseline and no medication changes are warranted at this time. Asked to keep bp check as scheduled and if dizziness get worse to report to [place] or go to emergency room. Patient verbalized understanding. Thanks !

## 2017-11-10 NOTE — Telephone Encounter (Signed)
-----   Message from Dorena Dew, [place] sent at 11/10/2017  1:39 PM EST ----- Regarding: lab results Please inform patient that all laboratory results are consistent with baseline. No medication changes warranted at this time. Will follow up for bp check as scheduled. If dizziness worsens, please report clinic or emergency department.   Thanks

## 2017-11-11 ENCOUNTER — Other Ambulatory Visit: Payer: Self-pay | Admitting: Family Medicine

## 2017-11-11 ENCOUNTER — Telehealth: Payer: Self-pay

## 2017-11-11 DIAGNOSIS — N390 Urinary tract infection, site not specified: Secondary | ICD-10-CM

## 2017-11-11 LAB — URINE CULTURE

## 2017-11-11 MED ORDER — NITROFURANTOIN MONOHYD MACRO 100 MG PO CAPS: 100.0000 mg | ORAL_CAPSULE | Freq: Two times a day (BID) | ORAL | 0 refills | Status: AC

## 2017-11-11 NOTE — Progress Notes (Signed)
Meds ordered this encounter  Medications  . nitrofurantoin, macrocrystal-monohydrate, (MACROBID) 100 MG capsule    Sig: Take 1 capsule (100 mg total) by mouth 2 (two) times daily for 7 days.    Dispense:  14 capsule    Refill:  0    Dandre Sisler Moore Tomy Khim  MSN, FNP-C [facility] [address]  [place], [place] [address] [phone]  

## 2017-11-11 NOTE — Telephone Encounter (Signed)
Called, no answer. Left a message for patient to call back. Thanks!  

## 2017-11-11 NOTE — Telephone Encounter (Signed)
-----   Message from Dorena Dew, [place] sent at 11/11/2017  3:54 PM EST ----- Regarding: lab results Please inform patient that she has a urinary tract infection with E.coli. Sent Macroibid 100 mg BID for 7 days. Remind her to increase water intake and wipe from front to back. Follow up as scheduled.   Thanks

## 2017-11-13 ENCOUNTER — Ambulatory Visit
Admission: RE | Admit: 2017-11-13 | Discharge: 2017-11-13 | Disposition: A | Discharge status: Home / Self Care | Payer: Medicaid Other | Source: Ambulatory Visit | Attending: Family Medicine | Admitting: Family Medicine

## 2017-11-13 DIAGNOSIS — Z1231 Encounter for screening mammogram for malignant neoplasm of breast: Secondary | ICD-10-CM

## 2017-11-13 NOTE — Telephone Encounter (Signed)
Called and spoke with patients care giver (niece) advised that urine was going e coli and she needs an antibiotic for this. Advised that we have sent in [place] 100mg   Twice daily for 7 days, asked that she drink plenty of water and wipe from front to back and keep next scheduled appointment. Patient verbalized understanding. Thanks!

## 2017-11-14 NOTE — Progress Notes (Signed)
Chief Complaint  Patient presents with  . Toe Pain  . Dizziness  . Hypertension    Subjective:    Patient ID: Bailey Walker, female    DOB: [birth year], 72 y.o.   MRN: [id number]  HPI A 72 year old female that presents accompanied by niece for a follow up of hypertension. She is also complaining of dizziness and periodic toe pain.  Bailey Walker primarily speaks Ar[place]ic and her niece is interpreting to assist with communication.  Bailey Walker is following up on hypertension. She has been taking all medications consistently. She endorses occasional dizziness on standing. Patient hs a history of of supraventricular tachycardia. Patient underwent a 2-D echo 08/2017, LV EF 55-60%. Her niece has been checking blood pressures at home which have been elevated. Patient does not follow a low fat, low sodium diet and does not exercise routinely.  She denies chest pain, heart palpitations, shortness of breath, syncope, nausea, vomiting, diarrhea, or dysuria.  Patient is also complaining of periodic numbness and tingling to feet bilaterally. She has a history of type 2 diabetes. Her last hemoglobin a1C on 10/08/2017 was 7/6. She has been taking Metformin 500 mg BID consistently.   Past Medical History:  Diagnosis Date  . Diabetes mellitus without complication ([place])   . Hypertension   . SVT (supraventricular tachycardia) ([place])   . Thyroid disease    Current Meds  Medication Sig  . Blood Pressure Monitoring (BLOOD PRESSURE MONITOR/M CUFF) MISC 1 each by Does not apply route 2 (two) times daily.  Marland Kitchen Dexlansoprazole (DEXILANT) 30 MG capsule Take 1 capsule (30 mg total) by mouth daily.  Marland Kitchen levothyroxine (SYNTHROID, LEVOTHROID) 75 MCG tablet TAKE 1 TABLET BY MOUTH EVERY DAY BEFORE BREAKFAST  . metFORMIN (GLUCOPHAGE) 500 MG tablet Take by mouth 2 (two) times daily with a meal.  . Metoprolol Tartrate 37.5 MG TABS Take 37.5 mg by mouth 2 (two) times daily.   Social History   Socioeconomic History  . Marital status:  Single    Spouse name: Not on file  . Number of children: 0  . Years of education: Not on file  . Highest education level: Not on file  Social Needs  . Financial resource strain: Not on file  . Food insecurity - worry: Not on file  . Food insecurity - inability: Not on file  . Transportation needs - medical: Not on file  . Transportation needs - non-medical: Not on file  Occupational History  . Not on file  Tobacco Use  . Smoking status: Former Smoker    Last attempt to quit: 12/29/2013    Years since quitting: 3.8  . Smokeless tobacco: Never Used  Substance and Sexual Activity  . Alcohol use: No  . Drug use: No  . Sexual activity: Not on file  Other Topics Concern  . Not on file  Social History Narrative  . Not on file   Immunization History  Administered Date(s) Administered  . Influenza-Unspecified 10/22/2010  . MMR 03/07/2009  . Pneumococcal Conjugate-13 01/08/2017  . Pneumococcal Polysaccharide-23 11/13/2009  . Tdap 03/07/2009, 02/03/2017      Review of Systems  Constitutional: Negative.   HENT: Negative.   Eyes: Negative.   Respiratory: Negative.   Cardiovascular: Negative.  Negative for chest pain.  Gastrointestinal: Negative.   Endocrine: Negative.   Genitourinary: Negative.   Musculoskeletal: Negative.   Skin: Negative.   Neurological: Positive for dizziness, light-headedness and numbness (bilateral lower extremities).  Hematological: Negative.   Psychiatric/Behavioral: Negative.  Objective:   Physical Exam  Constitutional: She is oriented to person, place, and time. She appears well-developed and well-nourished.  HENT:  Head: Normocephalic and atraumatic.  Right Ear: External ear normal.  Left Ear: External ear normal.  Nose: Nose normal.  Mouth/Throat: Oropharynx is clear and moist.  Eyes: Pupils are equal, round, and reactive to light.  Cardiovascular: Normal rate, regular rhythm, normal heart sounds and intact distal pulses.   Pulmonary/Chest: Effort normal and breath sounds normal.  Abdominal: Soft. Bowel sounds are normal.  Neurological: She is alert and oriented to person, place, and time. She has normal reflexes.  Skin:  Toenail thickening     BP (!) 142/72 (BP Location: Left Arm, Patient Position: Sitting, Cuff Size: Large) Comment: manually  Pulse 98   Temp 98.4 F (36.9 C) (Oral)   Resp 16   Ht '5\' 2"'  (1.575 m)   Wt 178 lb (80.7 kg)   SpO2 98%   BMI 32.56 kg/m  Assessment & Plan:  1. Dizziness EKG, normal sinus rhythm, consistent with previous - EKG 12-Lead - Orthostatic vital signs - CBC with Differential - Basic Metabolic Panel - Urine Culture  2. Acute non intractable tension-type headache - acetaminophen (TYLENOL) 500 MG tablet; Take 1 tablet (500 mg total) by mouth every 6 (six) hours as needed.  Dispense: 30 tablet; Refill: 0 - ketorolac (TORADOL) 30 MG/ML injection 30 mg  3. Onychomycosis - Ambulatory referral to Podiatry  4. Allergic rhinitis due to other allergic trigger, unspecified seasonality - cetirizine (ZYRTEC) 10 MG tablet; Take 1 tablet (10 mg total) by mouth daily.  Dispense: 30 tablet; Refill: 11  5. Essential hypertension Blood pressure is at goal on current medications regimen. No changes warranted.  Patient advised to follow up in cardiology as scheduled Continue medication, monitor blood pressure at home. Continue DASH diet. Reminder to go to the ER if any CP, SOB, nausea, dizziness, severe HA, changes vision/speech, left arm numbness and tingling and jaw pain.  - POCT urinalysis dip (device)  6. Urine findings abnormal - Urine Culture  7. Pain due to onychomycosis of toenails of both feet -Referral to podiatry   RTC: 3 months for chronic conditions   Donia Pounds  MSN, FNP-C Patient [place] [address] [place], [place] [address] [phone]

## 2017-11-17 ENCOUNTER — Other Ambulatory Visit: Payer: Self-pay | Admitting: Family Medicine

## 2017-11-17 DIAGNOSIS — R928 Other abnormal and inconclusive findings on diagnostic imaging of breast: Secondary | ICD-10-CM

## 2017-11-20 ENCOUNTER — Other Ambulatory Visit: Payer: Medicaid Other

## 2017-11-23 ENCOUNTER — Other Ambulatory Visit: Payer: Self-pay | Admitting: Family Medicine

## 2017-11-23 ENCOUNTER — Ambulatory Visit (INDEPENDENT_AMBULATORY_CARE_PROVIDER_SITE_OTHER): Payer: Medicaid Other | Admitting: Family Medicine

## 2017-11-23 DIAGNOSIS — E039 Hypothyroidism, unspecified: Secondary | ICD-10-CM

## 2017-11-23 DIAGNOSIS — I10 Essential (primary) hypertension: Secondary | ICD-10-CM

## 2017-11-23 NOTE — Progress Notes (Signed)
Patient here for bp check, reading was 138/78 Manually. Patient states she has taken Metoprolol 1 hour ago. I advised patient to continue taking medication every day as prescribed and keep next scheduled appointment with [place] and she also has an appointment with cardiology tomorrow, was advised to keep that as well. Thanks!

## 2017-11-24 ENCOUNTER — Encounter: Payer: Self-pay | Admitting: Cardiology

## 2017-11-24 ENCOUNTER — Ambulatory Visit (INDEPENDENT_AMBULATORY_CARE_PROVIDER_SITE_OTHER): Payer: Medicaid Other | Admitting: Cardiology

## 2017-11-24 VITALS — BP 132/84 | HR 76 | Ht 62.0 in | Wt 181.0 lb

## 2017-11-24 DIAGNOSIS — E088 Diabetes mellitus due to underlying condition with unspecified complications: Secondary | ICD-10-CM

## 2017-11-24 DIAGNOSIS — I10 Essential (primary) hypertension: Secondary | ICD-10-CM | POA: Diagnosis not present

## 2017-11-24 DIAGNOSIS — Z87891 Personal history of nicotine dependence: Secondary | ICD-10-CM

## 2017-11-24 DIAGNOSIS — I471 Supraventricular tachycardia: Secondary | ICD-10-CM

## 2017-11-24 NOTE — Patient Instructions (Signed)
Medication Instructions:  Your physician recommends that you continue on your current medications as directed. Please refer to the Current Medication list given to you today.  Labwork: None  Testing/Procedures: None  Follow-Up: Your physician recommends that you schedule a follow-up appointment in: 6 months  Any Other Special Instructions Will Be Listed Below (If Applicable).     If you need a refill on your cardiac medications before your next appointment, please call your pharmacy.   [facility]  Alfard Cochrane A, RN, BSN  

## 2017-11-24 NOTE — Progress Notes (Signed)
Cardiology Office Note:    Date:  11/24/2017   ID:  Bailey Walker, DOB 22-Jan-1946, MRN [id number]  PCP:  Bailey Dew, FNP  Cardiologist:  Bailey Lindau, MD   Referring MD: Bailey Dew, FNP    ASSESSMENT:    1. Essential hypertension   2. Diabetes mellitus due to underlying condition with complication, without long-term current use of insulin ([place])   3. Ex-smoker   4. SVT (supraventricular tachycardia) ([facility])    PLAN:    In order of problems listed above:  1. I discussed my findings with the patient at extensive length.  She and her niece had multiple questions which were answered to their satisfaction.  Her niece translated for her.  The patient was advised about importance of regular exercise.  Compliance with diet and medication and losing weight.  She plans to do so. 2. Patient will be seen in follow-up appointment in 6 months or earlier if the patient has any concerns    Medication Adjustments/Labs and Tests Ordered: Current medicines are reviewed at length with the patient today.  Concerns regarding medicines are outlined above.  No orders of the defined types were placed in this encounter.  No orders of the defined types were placed in this encounter.    Chief Complaint  Patient presents with  . Follow-up  . Chest Pain  . Hypertension     History of Present Illness:    Bailey Walker is a 72 y.o. female.  The patient was evaluated by me for chest pain.  She underwent echocardiogram and stress testing which are unremarkable.  Subsequently she has not had any problems.  No chest pain orthopnea or PND.  She has a very supportive niece who accompanies her today at this visit.  At the time of my evaluation, the patient is alert awake oriented and in no distress.  He leads a sedentary lifestyle.  Past Medical History:  Diagnosis Date  . Diabetes mellitus without complication ([place])   . Hypertension   . SVT (supraventricular tachycardia) ([place])   . Thyroid  disease     Past Surgical History:  Procedure Laterality Date  . APPENDECTOMY    . fatty gland     left wrist , right breast  . KNEE SURGERY     left knee    Current Medications: Current Meds  Medication Sig  . Blood Pressure Monitoring (BLOOD PRESSURE MONITOR/M CUFF) MISC 1 each by Does not apply route 2 (two) times daily.  . cetirizine (ZYRTEC) 10 MG tablet Take 1 tablet (10 mg total) by mouth daily.  Marland Kitchen Dexlansoprazole (DEXILANT) 30 MG capsule Take 1 capsule (30 mg total) by mouth daily.  Marland Kitchen levothyroxine (SYNTHROID, LEVOTHROID) 75 MCG tablet TAKE 1 TABLET BY MOUTH EVERY DAY BEFORE BREAKFAST  . metFORMIN (GLUCOPHAGE) 500 MG tablet Take by mouth 2 (two) times daily with a meal.  . Metoprolol Tartrate 37.5 MG TABS Take 37.5 mg by mouth 2 (two) times daily.     Allergies:   Ampicillin   Social History   Socioeconomic History  . Marital status: Single    Spouse name: None  . Number of children: 0  . Years of education: None  . Highest education level: None  Social Needs  . Financial resource strain: None  . Food insecurity - worry: None  . Food insecurity - inability: None  . Transportation needs - medical: None  . Transportation needs - non-medical: None  Occupational History  . None  Tobacco  Use  . Smoking status: Former Smoker    Last attempt to quit: 12/29/2013    Years since quitting: 3.9  . Smokeless tobacco: Never Used  Substance and Sexual Activity  . Alcohol use: No  . Drug use: No  . Sexual activity: None  Other Topics Concern  . None  Social History Narrative  . None     Family History: The patient's family history includes Breast cancer in her cousin. There is no history of Colon cancer, Esophageal cancer, or Rectal cancer.  ROS:   Please see the history of present illness.    All other systems reviewed and are negative.  EKGs/Labs/Other Studies Reviewed:    The following studies were reviewed today: Results of the stress test and  echocardiogram was discussed with the patient at extensive length and she vocalized understanding and questions were answered to her satisfaction.   Recent Labs: 08/12/2017: ALT 13 10/08/2017: TSH 3.300 11/09/2017: BUN 11; Creatinine, Ser 0.66; Hemoglobin 11.5; Platelets 277; Potassium 4.1; Sodium 139  Recent Lipid Panel No results found for: CHOL, TRIG, HDL, CHOLHDL, VLDL, LDLCALC, LDLDIRECT  Physical Exam:    VS:  BP 132/84 (BP Location: Right Arm, Patient Position: Sitting, Cuff Size: Normal)   Pulse 76   Ht 5\' 2"  (1.575 m)   Wt 181 lb (82.1 kg)   SpO2 98%   BMI 33.11 kg/m     Wt Readings from Last 3 Encounters:  11/24/17 181 lb (82.1 kg)  11/09/17 178 lb (80.7 kg)  10/08/17 176 lb (79.8 kg)     GEN: Patient is in no acute distress HEENT: Normal NECK: No JVD; No carotid bruits LYMPHATICS: No lymphadenopathy CARDIAC: Hear sounds regular, 2/6 systolic murmur at the apex. RESPIRATORY:  Clear to auscultation without rales, wheezing or rhonchi  ABDOMEN: Soft, non-tender, non-distended MUSCULOSKELETAL:  No edema; No deformity  SKIN: Warm and dry NEUROLOGIC:  Alert and oriented x 3 PSYCHIATRIC:  Normal affect   Signed, Bailey Lindau, MD  11/24/2017 9:35 AM    [place]

## 2017-11-25 ENCOUNTER — Ambulatory Visit
Admission: RE | Admit: 2017-11-25 | Discharge: 2017-11-25 | Disposition: A | Discharge status: Home / Self Care | Payer: Medicaid Other | Source: Ambulatory Visit | Attending: Family Medicine | Admitting: Family Medicine

## 2017-11-25 ENCOUNTER — Other Ambulatory Visit: Payer: Self-pay | Admitting: Family Medicine

## 2017-11-25 DIAGNOSIS — R928 Other abnormal and inconclusive findings on diagnostic imaging of breast: Secondary | ICD-10-CM

## 2017-11-25 DIAGNOSIS — N6320 Unspecified lump in the left breast, unspecified quadrant: Secondary | ICD-10-CM

## 2017-11-25 DIAGNOSIS — N6310 Unspecified lump in the right breast, unspecified quadrant: Secondary | ICD-10-CM

## 2017-12-03 ENCOUNTER — Ambulatory Visit: Payer: Medicaid Other | Admitting: Podiatry

## 2017-12-07 ENCOUNTER — Other Ambulatory Visit: Payer: Self-pay | Admitting: Family Medicine

## 2017-12-07 ENCOUNTER — Telehealth: Payer: Self-pay

## 2017-12-07 DIAGNOSIS — I10 Essential (primary) hypertension: Secondary | ICD-10-CM

## 2017-12-07 MED ORDER — METOPROLOL TARTRATE 25 MG PO TABS: 25.0000 mg | ORAL_TABLET | Freq: Two times a day (BID) | ORAL | 1 refills | Status: DC

## 2017-12-07 NOTE — Telephone Encounter (Signed)
Patient's niece called and is saying that they have tried several different pharmacies and nobody has the Metoprolol 37.5mg  in stock, it is on Haematologist. She is asking if you can change the dosage on this if possible? Patient is completely out and has been for 2 days. IF you do change this please send to [facility] in [place]. Thanks!

## 2017-12-07 NOTE — Progress Notes (Signed)
Meds ordered this encounter  Medications  . metoprolol tartrate (LOPRESSOR) 25 MG tablet    Sig: Take 1 tablet (25 mg total) by mouth 2 (two) times daily.    Dispense:  60 tablet    Refill:  [place]  MSN, FNP-C Patient [place] [address] [place], [place] [address] [phone]

## 2017-12-26 ENCOUNTER — Other Ambulatory Visit: Payer: Self-pay | Admitting: Family Medicine

## 2017-12-26 DIAGNOSIS — E039 Hypothyroidism, unspecified: Secondary | ICD-10-CM

## 2018-01-25 ENCOUNTER — Other Ambulatory Visit: Payer: Self-pay | Admitting: Family Medicine

## 2018-01-25 DIAGNOSIS — K219 Gastro-esophageal reflux disease without esophagitis: Secondary | ICD-10-CM

## 2018-01-25 DIAGNOSIS — E039 Hypothyroidism, unspecified: Secondary | ICD-10-CM

## 2018-02-06 ENCOUNTER — Other Ambulatory Visit: Payer: Self-pay | Admitting: Family Medicine

## 2018-02-06 DIAGNOSIS — I10 Essential (primary) hypertension: Secondary | ICD-10-CM

## 2018-02-07 ENCOUNTER — Other Ambulatory Visit: Payer: Self-pay | Admitting: Family Medicine

## 2018-02-07 DIAGNOSIS — E119 Type 2 diabetes mellitus without complications: Secondary | ICD-10-CM

## 2018-02-08 ENCOUNTER — Ambulatory Visit (INDEPENDENT_AMBULATORY_CARE_PROVIDER_SITE_OTHER): Payer: Medicaid Other | Admitting: Family Medicine

## 2018-02-08 ENCOUNTER — Encounter: Payer: Self-pay | Admitting: Family Medicine

## 2018-02-08 VITALS — BP 137/73 | HR 86 | Temp 98.5°F | Resp 16 | Ht 62.0 in | Wt 179.0 lb

## 2018-02-08 DIAGNOSIS — E088 Diabetes mellitus due to underlying condition with unspecified complications: Secondary | ICD-10-CM

## 2018-02-08 DIAGNOSIS — E039 Hypothyroidism, unspecified: Secondary | ICD-10-CM

## 2018-02-08 DIAGNOSIS — R42 Dizziness and giddiness: Secondary | ICD-10-CM | POA: Diagnosis not present

## 2018-02-08 DIAGNOSIS — I10 Essential (primary) hypertension: Secondary | ICD-10-CM | POA: Diagnosis not present

## 2018-02-08 DIAGNOSIS — Z789 Other specified health status: Secondary | ICD-10-CM | POA: Diagnosis not present

## 2018-02-08 LAB — POCT URINALYSIS DIPSTICK
Bilirubin, UA: NEGATIVE
Glucose, UA: NEGATIVE
Ketones, UA: NEGATIVE
Nitrite, UA: NEGATIVE
Protein, UA: NEGATIVE
Spec Grav, UA: 1.025 (ref 1.010–1.025)
Urobilinogen, UA: 0.2 E.U./dL
pH, UA: 5 (ref 5.0–8.0)

## 2018-02-08 LAB — POCT GLYCOSYLATED HEMOGLOBIN (HGB A1C): Hemoglobin A1C: 7.1 % — AB (ref 4.0–5.6)

## 2018-02-08 NOTE — Patient Instructions (Addendum)
Coricidin HBP OTC      Hypothyroidism Hypothyroidism is a disorder of the thyroid. The thyroid is a large gland that is located in the lower front of the neck. The thyroid releases hormones that control how the body works. With hypothyroidism, the thyroid does not make enough of these hormones. What are the causes? Causes of hypothyroidism may include:  Viral infections.  Pregnancy.  Your own defense system (immune system) attacking your thyroid.  Certain medicines.  Birth defects.  Past radiation treatments to your head or neck.  Past treatment with radioactive iodine.  Past surgical removal of part or all of your thyroid.  Problems with the gland that is located in the center of your brain (pituitary).  What are the signs or symptoms? Signs and symptoms of hypothyroidism may include:  Feeling as though you have no energy (lethargy).  Inability to tolerate cold.  Weight gain that is not explained by a change in diet or exercise habits.  Dry skin.  Coarse hair.  Menstrual irregularity.  Slowing of thought processes.  Constipation.  Sadness or depression.  How is this diagnosed? Your health care provider may diagnose hypothyroidism with blood tests and ultrasound tests. How is this treated? Hypothyroidism is treated with medicine that replaces the hormones that your body does not make. After you begin treatment, it may take several weeks for symptoms to go away. Follow these instructions at home:  Take medicines only as directed by your health care provider.  If you start taking any new medicines, tell your health care provider.  Keep all follow-up visits as directed by your health care provider. This is important. As your condition improves, your dosage needs may change. You will need to have blood tests regularly so that your health care provider can watch your condition. Contact a health care provider if:  Your symptoms do not get better with  treatment.  You are taking thyroid replacement medicine and: ? You sweat excessively. ? You have tremors. ? You feel anxious. ? You lose weight rapidly. ? You cannot tolerate heat. ? You have emotional swings. ? You have diarrhea. ? You feel weak. Get help right away if:  You develop chest pain.  You develop an irregular heartbeat.  You develop a rapid heartbeat. This information is not intended to replace advice given to you by your health care provider. Make sure you discuss any questions you have with your health care provider. Document Released: 09/08/2005 Document Revised: 02/14/2016 Document Reviewed: 01/24/2014 Elsevier Interactive Patient Education  2018 [place] Eating Plan DASH stands for "Dietary Approaches to Stop Hypertension." The DASH eating plan is a healthy eating plan that has been shown to reduce high blood pressure (hypertension). It may also reduce your risk for type 2 diabetes, heart disease, and stroke. The DASH eating plan may also help with weight loss. What are tips for following this plan? General guidelines  Avoid eating more than 2,300 mg (milligrams) of salt (sodium) a day. If you have hypertension, you may need to reduce your sodium intake to 1,500 mg a day.  Limit alcohol intake to no more than 1 drink a day for nonpregnant women and 2 drinks a day for men. One drink equals 12 oz of beer, 5 oz of wine, or 1 oz of hard liquor.  Work with your health care provider to maintain a healthy body weight or to lose weight. Ask what an ideal weight is for you.  Get at least 30 minutes of  exercise that causes your heart to beat faster (aerobic exercise) most days of the week. Activities may include walking, swimming, or biking.  Work with your health care provider or diet and nutrition specialist (dietitian) to adjust your eating plan to your individual calorie needs. Reading food labels  Check food labels for the amount of sodium per serving.  Choose foods with less than 5 percent of the Daily Value of sodium. Generally, foods with less than 300 mg of sodium per serving fit into this eating plan.  To find whole grains, look for the word "whole" as the first word in the ingredient list. Shopping  Buy products labeled as "low-sodium" or "no salt added."  Buy fresh foods. Avoid canned foods and premade or frozen meals. Cooking  Avoid adding salt when cooking. Use salt-free seasonings or herbs instead of table salt or sea salt. Check with your health care provider or pharmacist before using salt substitutes.  Do not fry foods. Cook foods using healthy methods such as baking, boiling, grilling, and broiling instead.  Cook with heart-healthy oils, such as olive, canola, soybean, or sunflower oil. Meal planning   Eat a balanced diet that includes: ? 5 or more servings of fruits and vegetables each day. At each meal, try to fill half of your plate with fruits and vegetables. ? Up to 6-8 servings of whole grains each day. ? Less than 6 oz of lean meat, poultry, or fish each day. A 3-oz serving of meat is about the same size as a deck of cards. One egg equals 1 oz. ? 2 servings of low-fat dairy each day. ? A serving of nuts, seeds, or beans 5 times each week. ? Heart-healthy fats. Healthy fats called Omega-3 fatty acids are found in foods such as flaxseeds and coldwater fish, like sardines, salmon, and mackerel.  Limit how much you eat of the following: ? Canned or prepackaged foods. ? Food that is high in trans fat, such as fried foods. ? Food that is high in saturated fat, such as fatty meat. ? Sweets, desserts, sugary drinks, and other foods with added sugar. ? Full-fat dairy products.  Do not salt foods before eating.  Try to eat at least 2 vegetarian meals each week.  Eat more home-cooked food and less restaurant, buffet, and fast food.  When eating at a restaurant, ask that your food be prepared with less salt or no  salt, if possible. What foods are recommended? The items listed may not be a complete list. Talk with your dietitian about what dietary choices are best for you. Grains Whole-grain or whole-wheat bread. Whole-grain or whole-wheat pasta. Brown rice. [place]. Bulgur. Whole-grain and low-sodium cereals. Pita bread. Low-fat, low-sodium crackers. Whole-wheat flour tortillas. Vegetables Fresh or frozen vegetables (raw, steamed, roasted, or grilled). Low-sodium or reduced-sodium tomato and vegetable juice. Low-sodium or reduced-sodium tomato sauce and tomato paste. Low-sodium or reduced-sodium canned vegetables. Fruits All fresh, dried, or frozen fruit. Canned fruit in natural juice (without added sugar). Meat and other protein foods Skinless chicken or [place]. Ground chicken or [place]. Pork with fat trimmed off. Fish and seafood. Egg whites. Dried beans, peas, or lentils. Unsalted nuts, nut butters, and seeds. Unsalted canned beans. Lean cuts of beef with fat trimmed off. Low-sodium, lean deli meat. Dairy Low-fat (1%) or fat-free (skim) milk. Fat-free, low-fat, or reduced-fat cheeses. Nonfat, low-sodium ricotta or cottage cheese. Low-fat or nonfat yogurt. Low-fat, low-sodium cheese. Fats and oils Soft margarine without trans fats. Vegetable oil. Low-fat, reduced-fat,  or light mayonnaise and salad dressings (reduced-sodium). Canola, safflower, olive, soybean, and sunflower oils. Avocado. Seasoning and other foods Herbs. Spices. Seasoning mixes without salt. Unsalted popcorn and pretzels. Fat-free sweets. What foods are not recommended? The items listed may not be a complete list. Talk with your dietitian about what dietary choices are best for you. Grains Baked goods made with fat, such as croissants, muffins, or some breads. Dry pasta or rice meal packs. Vegetables Creamed or fried vegetables. Vegetables in a cheese sauce. Regular canned vegetables (not low-sodium or reduced-sodium). Regular  canned tomato sauce and paste (not low-sodium or reduced-sodium). Regular tomato and vegetable juice (not low-sodium or reduced-sodium). Angie Fava. Olives. Fruits Canned fruit in a light or heavy syrup. Fried fruit. Fruit in cream or butter sauce. Meat and other protein foods Fatty cuts of meat. Ribs. Fried meat. Berniece Salines. Sausage. Bologna and other processed lunch meats. Salami. Fatback. Hotdogs. Bratwurst. Salted nuts and seeds. Canned beans with added salt. Canned or smoked fish. Whole eggs or egg yolks. Chicken or [place] with skin. Dairy Whole or 2% milk, cream, and half-and-half. Whole or full-fat cream cheese. Whole-fat or sweetened yogurt. Full-fat cheese. Nondairy creamers. Whipped toppings. Processed cheese and cheese spreads. Fats and oils Butter. Stick margarine. Lard. Shortening. Ghee. Bacon fat. Tropical oils, such as coconut, palm kernel, or palm oil. Seasoning and other foods Salted popcorn and pretzels. Onion salt, garlic salt, seasoned salt, table salt, and sea salt. Worcestershire sauce. Tartar sauce. Barbecue sauce. Teriyaki sauce. Soy sauce, including reduced-sodium. Steak sauce. Canned and packaged gravies. Fish sauce. Oyster sauce. Cocktail sauce. Horseradish that you find on the shelf. Ketchup. Mustard. Meat flavorings and tenderizers. Bouillon cubes. Hot sauce and Tabasco sauce. Premade or packaged marinades. Premade or packaged taco seasonings. Relishes. Regular salad dressings. Where to find more information:  National Heart, Lung, and [place]: https://wilson-eaton.com/  American Heart Association: www.heart.org Summary  The DASH eating plan is a healthy eating plan that has been shown to reduce high blood pressure (hypertension). It may also reduce your risk for type 2 diabetes, heart disease, and stroke.  With the DASH eating plan, you should limit salt (sodium) intake to 2,300 mg a day. If you have hypertension, you may need to reduce your sodium intake to 1,500 mg a  day.  When on the DASH eating plan, aim to eat more fresh fruits and vegetables, whole grains, lean proteins, low-fat dairy, and heart-healthy fats.  Work with your health care provider or diet and nutrition specialist (dietitian) to adjust your eating plan to your individual calorie needs. This information is not intended to replace advice given to you by your health care provider. Make sure you discuss any questions you have with your health care provider. Document Released: 08/28/2011 Document Revised: 09/01/2016 Document Reviewed: 09/01/2016 Elsevier Interactive Patient Education  [employer].

## 2018-02-08 NOTE — Progress Notes (Signed)
Chief Complaint  Patient presents with  . Hypertension  . Diabetes    Subjective:    Patient ID: Bailey Walker, female    DOB: [birth year], 72 y.o.   MRN: [id number]  HPI Ms. Bailey Walker, a 72 year old female presents accompanied by niece for a 3 month follow up of chronic conditions. Patient primarily speaks [place]ic and her niece will be interpreting to assist with communication. Patient has a history of hypertension, DMII, and hypothyroidism.  Most recent hemoglobin a1c was 7.6 Patient was continued on Metformin 500 mg BID and a carbohydrate modified diet. She says that she has been taking medications consistently. She denies fatigue, polydipsia, polyphagia, or polyuria.  Patient has a history of hypertension.  Blood pressure has been controlled on Metoprolol 37.5 mg daily. Patient denies chest pain, chest pressure/discomfort, dyspnea, fatigue, irregular heart beat, lower extremity edema, orthopnea, palpitations and tachypnea.  Cardiovascular risk factors: obesity (BMI >= 30 kg/m2) and sedentary lifestyle.    Patient presents for evaluation of thyroid function. Symptoms have been controlled on current medication regimen.   Past Medical History:  Diagnosis Date  . Diabetes mellitus without complication ([facility])   . Hypertension   . SVT (supraventricular tachycardia) ([facility])   . Thyroid disease    Social History   Socioeconomic History  . Marital status: Single    Spouse name: Not on file  . Number of children: 0  . Years of education: Not on file  . Highest education level: Not on file  Occupational History  . Not on file  Social Needs  . Financial resource strain: Not on file  . Food insecurity:    Worry: Not on file    Inability: Not on file  . Transportation needs:    Medical: Not on file    Non-medical: Not on file  Tobacco Use  . Smoking status: Former Smoker    Last attempt to quit: 12/29/2013    Years since quitting: 4.1  . Smokeless tobacco: Never Used  Substance and  Sexual Activity  . Alcohol use: No  . Drug use: No  . Sexual activity: Not on file  Lifestyle  . Physical activity:    Days per week: Not on file    Minutes per session: Not on file  . Stress: Not on file  Relationships  . Social connections:    Talks on phone: Not on file    Gets together: Not on file    Attends religious service: Not on file    Active member of club or organization: Not on file    Attends meetings of clubs or organizations: Not on file    Relationship status: Not on file  . Intimate partner violence:    Fear of current or ex partner: Not on file    Emotionally abused: Not on file    Physically abused: Not on file    Forced sexual activity: Not on file  Other Topics Concern  . Not on file  Social History Narrative  . Not on file   Review of Systems  Constitutional: Positive for fatigue and unexpected weight change.  HENT: Negative.  Negative for sinus pressure and sinus pain.   Eyes: Negative.  Negative for redness.  Respiratory: Negative.   Cardiovascular: Negative for chest pain, palpitations and leg swelling.  Gastrointestinal: Negative.   Endocrine: Negative for cold intolerance, heat intolerance, polydipsia and polyphagia.  Musculoskeletal: Negative.   Skin: Negative.   Allergic/Immunologic: Negative.   Neurological: Negative.   Hematological:  Negative.   Psychiatric/Behavioral: Negative.         Objective:   Physical Exam  Constitutional: She appears well-developed and well-nourished.  HENT:  Head: Normocephalic and atraumatic.  Right Ear: External ear normal.  Left Ear: External ear normal.  Nose: Nose normal.  Mouth/Throat: Oropharynx is clear and moist.  Eyes: Pupils are equal, round, and reactive to light. Conjunctivae and EOM are normal.  Neck: Normal range of motion. Neck supple.  Cardiovascular: Normal rate, regular rhythm, normal heart sounds and intact distal pulses.  No murmur heard. Pulmonary/Chest: Effort normal and breath  sounds normal. No respiratory distress. She has no wheezes. She has no rales. She exhibits no tenderness.  Abdominal: Soft. Bowel sounds are normal.  Musculoskeletal: Normal range of motion.  Neurological: She is alert. She has normal reflexes. No cranial nerve deficit. Coordination normal.  Skin: Skin is warm and dry.  Psychiatric: She has a normal mood and affect. Her behavior is normal. Judgment and thought content normal.     BP 137/73 (BP Location: Left Arm, Patient Position: Sitting, Cuff Size: Normal)   Pulse 86   Temp 98.5 F (36.9 C) (Oral)   Resp 16   Ht 5\' 2"  (1.575 m)   Wt 179 lb (81.2 kg)   SpO2 98%   BMI 32.74 kg/m  Assessment & Plan:   1. Essential hypertension Blood pressure is at goal on current medication regimen.  Reviewed orthostatic vital signs, blood pressure decreases when transitioning from sitting to standing.  Discussed that patient more than likely has orthostatic hypotension.  Discussed the importance of transitioning from sitting to standing in a slow, deliberate manner.  Also continue to monitor blood pressure at home.  Follow-up in the emergency department for any acute abnormalities. - Urinalysis Dipstick - Comprehensive metabolic panel - Orthostatic vital signs  2. Diabetes mellitus due to underlying condition with complication, without long-term current use of insulin (HCC) Hemoglobin A1c is 7.1 which is improved from previous value.  Continue to check CBG at home.  Fasting blood sugar should range between 110-140. Recommend that patient continues a carbohydrate modify diet divided over 5-6 small meals throughout the day.  Also make an attempt to increase daily activities. - HgB A1c  3. Hypothyroidism, unspecified type - Thyroid Panel With TSH  4. Dizziness Review orthostatic vital signs.  Refer to #1 - CBC with Differential  5.  Language barrier to communication Patient primarily speaks urinate, utilize needs to assist with  communication.   RTC: 3 months for chronic conditions  Donia Pounds  MSN, FNP-C [facility] [address] [place], [place] [address] [phone]

## 2018-02-09 ENCOUNTER — Telehealth: Payer: Self-pay

## 2018-02-09 LAB — CBC WITH DIFFERENTIAL/PLATELET
Basophils Absolute: 0 10*3/uL (ref 0.0–0.2)
Basos: 1 %
EOS (ABSOLUTE): 0.1 10*3/uL (ref 0.0–0.4)
Eos: 1 %
Hematocrit: 37.1 % (ref 34.0–46.6)
Hemoglobin: 11.8 g/dL (ref 11.1–15.9)
Immature Grans (Abs): 0 10*3/uL (ref 0.0–0.1)
Immature Granulocytes: 0 %
Lymphocytes Absolute: 2 10*3/uL (ref 0.7–3.1)
Lymphs: 24 %
MCH: 25.5 pg — ABNORMAL LOW (ref 26.6–33.0)
MCHC: 31.8 g/dL (ref 31.5–35.7)
MCV: 80 fL (ref 79–97)
Monocytes Absolute: 0.8 10*3/uL (ref 0.1–0.9)
Monocytes: 10 %
Neutrophils Absolute: 5.5 10*3/uL (ref 1.4–7.0)
Neutrophils: 64 %
Platelets: 296 10*3/uL (ref 150–450)
RBC: 4.62 x10E6/uL (ref 3.77–5.28)
RDW: 15.4 % (ref 12.3–15.4)
WBC: 8.5 10*3/uL (ref 3.4–10.8)

## 2018-02-09 LAB — COMPREHENSIVE METABOLIC PANEL
ALT: 14 IU/L (ref 0–32)
AST: 13 IU/L (ref 0–40)
Albumin/Globulin Ratio: 1.4 (ref 1.2–2.2)
Albumin: 4.4 g/dL (ref 3.5–4.8)
Alkaline Phosphatase: 92 IU/L (ref 39–117)
BUN/Creatinine Ratio: 19 (ref 12–28)
BUN: 12 mg/dL (ref 8–27)
Bilirubin Total: 0.2 mg/dL (ref 0.0–1.2)
CO2: 20 mmol/L (ref 20–29)
Calcium: 9.4 mg/dL (ref 8.7–10.3)
Chloride: 101 mmol/L (ref 96–106)
Creatinine, Ser: 0.64 mg/dL (ref 0.57–1.00)
GFR calc Af Amer: 102 mL/min/{1.73_m2} (ref >59)
GFR calc non Af Amer: 89 mL/min/{1.73_m2} (ref >59)
Globulin, Total: 3.1 g/dL (ref 1.5–4.5)
Glucose: 192 mg/dL — ABNORMAL HIGH (ref 65–99)
Potassium: 4.5 mmol/L (ref 3.5–5.2)
Sodium: 136 mmol/L (ref 134–144)
Total Protein: 7.5 g/dL (ref 6.0–8.5)

## 2018-02-09 LAB — THYROID PANEL WITH TSH
Free Thyroxine Index: 2.2 (ref 1.2–4.9)
T3 Uptake Ratio: 26 % (ref 24–39)
T4, Total: 8.4 ug/dL (ref 4.5–12.0)
TSH: 2.65 u[IU]/mL (ref 0.450–4.500)

## 2018-02-09 NOTE — Telephone Encounter (Signed)
-----   Message from Dorena Dew, [place] sent at 02/09/2018 11:09 AM EDT ----- Regarding: lab results Please inform patient that all labs are within a normal range. No further medications warranted.   Donia Pounds  MSN, FNP-C [facility] [address] [place], [place] [address] [phone]

## 2018-02-09 NOTE — Telephone Encounter (Signed)
Called and spoke with patient, advised that all labs were normal and to keep next scheduled appointment. Thanks!

## 2018-02-24 ENCOUNTER — Other Ambulatory Visit: Payer: Self-pay | Admitting: Family Medicine

## 2018-02-24 DIAGNOSIS — E039 Hypothyroidism, unspecified: Secondary | ICD-10-CM

## 2018-03-11 ENCOUNTER — Other Ambulatory Visit: Payer: Self-pay

## 2018-03-11 DIAGNOSIS — E119 Type 2 diabetes mellitus without complications: Secondary | ICD-10-CM

## 2018-03-11 DIAGNOSIS — I10 Essential (primary) hypertension: Secondary | ICD-10-CM

## 2018-03-11 MED ORDER — METFORMIN HCL 500 MG PO TABS: 500.0000 mg | ORAL_TABLET | Freq: Two times a day (BID) | ORAL | 3 refills | Status: DC

## 2018-03-11 MED ORDER — METOPROLOL TARTRATE 25 MG PO TABS: 25.0000 mg | ORAL_TABLET | Freq: Two times a day (BID) | ORAL | 3 refills | Status: DC

## 2018-03-15 ENCOUNTER — Other Ambulatory Visit: Payer: Self-pay | Admitting: Family Medicine

## 2018-03-15 DIAGNOSIS — E039 Hypothyroidism, unspecified: Secondary | ICD-10-CM

## 2018-03-15 DIAGNOSIS — E119 Type 2 diabetes mellitus without complications: Secondary | ICD-10-CM

## 2018-03-15 DIAGNOSIS — K219 Gastro-esophageal reflux disease without esophagitis: Secondary | ICD-10-CM

## 2018-03-15 DIAGNOSIS — I10 Essential (primary) hypertension: Secondary | ICD-10-CM

## 2018-03-15 MED ORDER — LEVOTHYROXINE SODIUM 75 MCG PO TABS: ORAL_TABLET | ORAL | 0 refills | Status: DC

## 2018-03-15 MED ORDER — METFORMIN HCL 500 MG PO TABS: 500.0000 mg | ORAL_TABLET | Freq: Two times a day (BID) | ORAL | 3 refills | Status: DC

## 2018-03-15 MED ORDER — DEXLANSOPRAZOLE 30 MG PO CPDR: 30.0000 mg | DELAYED_RELEASE_CAPSULE | Freq: Every day | ORAL | 1 refills | Status: DC

## 2018-03-15 MED ORDER — METOPROLOL TARTRATE 25 MG PO TABS: 25.0000 mg | ORAL_TABLET | Freq: Two times a day (BID) | ORAL | 3 refills | Status: DC

## 2018-03-15 NOTE — Progress Notes (Signed)
Meds ordered this encounter  Medications  . Dexlansoprazole (DEXILANT) 30 MG capsule    Sig: Take 1 capsule (30 mg total) by mouth daily.    Dispense:  90 capsule    Refill:  1  . metFORMIN (GLUCOPHAGE) 500 MG tablet    Sig: Take 1 tablet (500 mg total) by mouth 2 (two) times daily with a meal.    Dispense:  180 tablet    Refill:  3    DX Code Needed  .  . levothyroxine (SYNTHROID, LEVOTHROID) 75 MCG tablet    Sig: TAKE 1 TABLET BY MOUTH EVERY DAY BEFORE BREAKFAST    Dispense:  90 tablet    Refill:  0  . metoprolol tartrate (LOPRESSOR) 25 MG tablet    Sig: Take 1 tablet (25 mg total) by mouth 2 (two) times daily.    Dispense:  180 tablet    Refill:  [place]  MSN, FNP-C [facility] [address] [place], [place] [address] [phone]

## 2018-05-12 ENCOUNTER — Ambulatory Visit (INDEPENDENT_AMBULATORY_CARE_PROVIDER_SITE_OTHER): Payer: Medicaid Other | Admitting: Family Medicine

## 2018-05-12 VITALS — BP 147/92 | HR 73 | Temp 98.0°F | Resp 16 | Ht 62.0 in | Wt 182.0 lb

## 2018-05-12 DIAGNOSIS — R8290 Unspecified abnormal findings in urine: Secondary | ICD-10-CM | POA: Diagnosis not present

## 2018-05-12 DIAGNOSIS — E119 Type 2 diabetes mellitus without complications: Secondary | ICD-10-CM

## 2018-05-12 DIAGNOSIS — K219 Gastro-esophageal reflux disease without esophagitis: Secondary | ICD-10-CM

## 2018-05-12 DIAGNOSIS — I10 Essential (primary) hypertension: Secondary | ICD-10-CM | POA: Diagnosis not present

## 2018-05-12 DIAGNOSIS — E088 Diabetes mellitus due to underlying condition with unspecified complications: Secondary | ICD-10-CM

## 2018-05-12 DIAGNOSIS — E559 Vitamin D deficiency, unspecified: Secondary | ICD-10-CM

## 2018-05-12 DIAGNOSIS — E039 Hypothyroidism, unspecified: Secondary | ICD-10-CM

## 2018-05-12 LAB — POCT URINALYSIS DIPSTICK
Bilirubin, UA: NEGATIVE
Blood, UA: NEGATIVE
Glucose, UA: NEGATIVE
Ketones, UA: NEGATIVE
Nitrite, UA: NEGATIVE
Protein, UA: NEGATIVE
Spec Grav, UA: 1.02 (ref 1.010–1.025)
Urobilinogen, UA: 0.2 E.U./dL
pH, UA: 5.5 (ref 5.0–8.0)

## 2018-05-12 LAB — POCT GLYCOSYLATED HEMOGLOBIN (HGB A1C): Hemoglobin A1C: 6.9 % — AB (ref 4.0–5.6)

## 2018-05-12 MED ORDER — METFORMIN HCL 500 MG PO TABS: 500.0000 mg | ORAL_TABLET | Freq: Two times a day (BID) | ORAL | 3 refills | Status: DC

## 2018-05-12 MED ORDER — METOPROLOL TARTRATE 25 MG PO TABS: 25.0000 mg | ORAL_TABLET | Freq: Two times a day (BID) | ORAL | 3 refills | Status: DC

## 2018-05-12 MED ORDER — DEXLANSOPRAZOLE 30 MG PO CPDR: 30.0000 mg | DELAYED_RELEASE_CAPSULE | Freq: Every day | ORAL | 1 refills | Status: DC

## 2018-05-12 NOTE — Patient Instructions (Addendum)
The 2018 Physical Activity Guidelines recommend the equivalent of 150 minutes per week of moderate to vigorous aerobic activity each week, with muscle-strengthening activities on two days during the week    PRESCRIPTION FOR EXERCISE  Frequency Four to five days per week  Intensity Moderate- During moderate intensity exercise, a person is too winded to sing but is not so winded they cannot talk  Time 30 minutes or a total of 150 minutes per week.   Type Brisk walking PLUS One set each of: 0 Body weight squats  10 0 Plank hold for 30 seconds   Basic bodyweight exercise guide: Squat and plank  (A) Body weight squat: 0 The squat develops strength in the legs and torso. Stand with your feet about shoulder's width apart and slightly externally rotated. Maintain your weight on your heels or midfoot throughout the exercise. Keep your lower back flat; do not allow it to round or to extend excessively. Your knees should remain aligned with your ankles and legs throughout the motion (knees caving inward towards one another is a common problem). Descend until your hips come just below the level of your knees. If strength or mobility limitations prevent you from reaching this depth, begin with partial squats and gradually increase the depth over time. Once you can perform 15 squats with proper technique and full depth, make the exercise more challenging by holding a weight. (B) Standard plank: 0 The basic plank develops torso (core) strength. Maintain a straight torso, not allowing your lower back to sag or your hips to elevate, throughout the exercise. Gradually increase the time you can hold the position.   Diabetes Mellitus and Nutrition When you have diabetes (diabetes mellitus), it is very important to have healthy eating habits because your blood sugar (glucose) levels are greatly affected by what you eat and drink. Eating healthy foods in the appropriate amounts, at about the same times every  day, can help you:  Control your blood glucose.  Lower your risk of heart disease.  Improve your blood pressure.  Reach or maintain a healthy weight.  Every person with diabetes is different, and each person has different needs for a meal plan. Your health care provider may recommend that you work with a diet and nutrition specialist (dietitian) to make a meal plan that is best for you. Your meal plan may vary depending on factors such as:  The calories you need.  The medicines you take.  Your weight.  Your blood glucose, blood pressure, and cholesterol levels.  Your activity level.  Other health conditions you have, such as heart or kidney disease.  How do carbohydrates affect me? Carbohydrates affect your blood glucose level more than any other type of food. Eating carbohydrates naturally increases the amount of glucose in your blood. Carbohydrate counting is a method for keeping track of how many carbohydrates you eat. Counting carbohydrates is important to keep your blood glucose at a healthy level, especially if you use insulin or take certain oral diabetes medicines. It is important to know how many carbohydrates you can safely have in each meal. This is different for every person. Your dietitian can help you calculate how many carbohydrates you should have at each meal and for snack. Foods that contain carbohydrates include:  Bread, cereal, rice, pasta, and crackers.  Potatoes and corn.  Peas, beans, and lentils.  Milk and yogurt.  Fruit and juice.  Desserts, such as cakes, cookies, ice cream, and candy.  How does alcohol affect me?  Alcohol can cause a sudden decrease in blood glucose (hypoglycemia), especially if you use insulin or take certain oral diabetes medicines. Hypoglycemia can be a life-threatening condition. Symptoms of hypoglycemia (sleepiness, dizziness, and confusion) are similar to symptoms of having too much alcohol. If your health care provider says  that alcohol is safe for you, follow these guidelines:  Limit alcohol intake to no more than 1 drink per day for nonpregnant women and 2 drinks per day for men. One drink equals 12 oz of beer, 5 oz of wine, or 1 oz of hard liquor.  Do not drink on an empty stomach.  Keep yourself hydrated with water, diet soda, or unsweetened iced tea.  Keep in mind that regular soda, juice, and other mixers may contain a lot of sugar and must be counted as carbohydrates.  What are tips for following this plan? Reading food labels  Start by checking the serving size on the label. The amount of calories, carbohydrates, fats, and other nutrients listed on the label are based on one serving of the food. Many foods contain more than one serving per package.  Check the total grams (g) of carbohydrates in one serving. You can calculate the number of servings of carbohydrates in one serving by dividing the total carbohydrates by 15. For example, if a food has 30 g of total carbohydrates, it would be equal to 2 servings of carbohydrates.  Check the number of grams (g) of saturated and trans fats in one serving. Choose foods that have low or no amount of these fats.  Check the number of milligrams (mg) of sodium in one serving. Most people should limit total sodium intake to less than 2,300 mg per day.  Always check the nutrition information of foods labeled as "low-fat" or "nonfat". These foods may be higher in added sugar or refined carbohydrates and should be avoided.  Talk to your dietitian to identify your daily goals for nutrients listed on the label. Shopping  Avoid buying canned, premade, or processed foods. These foods tend to be high in fat, sodium, and added sugar.  Shop around the outside edge of the grocery store. This includes fresh fruits and vegetables, bulk grains, fresh meats, and fresh dairy. Cooking  Use low-heat cooking methods, such as baking, instead of high-heat cooking methods like  deep frying.  Cook using healthy oils, such as olive, canola, or sunflower oil.  Avoid cooking with butter, cream, or high-fat meats. Meal planning  Eat meals and snacks regularly, preferably at the same times every day. Avoid going long periods of time without eating.  Eat foods high in fiber, such as fresh fruits, vegetables, beans, and whole grains. Talk to your dietitian about how many servings of carbohydrates you can eat at each meal.  Eat 4-6 ounces of lean protein each day, such as lean meat, chicken, fish, eggs, or tofu. 1 ounce is equal to 1 ounce of meat, chicken, or fish, 1 egg, or 1/4 cup of tofu.  Eat some foods each day that contain healthy fats, such as avocado, nuts, seeds, and fish. Lifestyle   Check your blood glucose regularly.  Exercise at least 30 minutes 5 or more days each week, or as told by your health care provider.  Take medicines as told by your health care provider.  Do not use any products that contain nicotine or tobacco, such as cigarettes and e-cigarettes. If you need help quitting, ask your health care provider.  Work with a Social worker or diabetes  educator to identify strategies to manage stress and any emotional and social challenges. What are some questions to ask my health care provider?  Do I need to meet with a diabetes educator?  Do I need to meet with a dietitian?  What number can I call if I have questions?  When are the best times to check my blood glucose? Where to find more information:  American Diabetes Association: diabetes.org/food-and-fitness/food  Academy of Nutrition and Dietetics: PokerClues.dk  [employer] of Diabetes and Digestive and Kidney Diseases (NIH): ContactWire.be Summary  A healthy meal plan will help you control your blood glucose and maintain a healthy lifestyle.  Working with a  diet and nutrition specialist (dietitian) can help you make a meal plan that is best for you.  Keep in mind that carbohydrates and alcohol have immediate effects on your blood glucose levels. It is important to count carbohydrates and to use alcohol carefully. This information is not intended to replace advice given to you by your health care provider. Make sure you discuss any questions you have with your health care provider. Document Released: 06/05/2005 Document Revised: 10/13/2016 Document Reviewed: 10/13/2016 Elsevier Interactive Patient Education  [employer].

## 2018-05-12 NOTE — Progress Notes (Signed)
[facility]  Chronic Disease Follow Up Provider: [name], FNP  SUBJECTIVE:  Patient presents for follow up for the following  chronic conditions. Presents with her family member who serves as the interpreter.  Niece states that she is to have surgery in [place] on her left lower extremity. She states that "the knee is broken". No imaging in [facility] system to confirm this. They decline further testing and will bring records at the next visit.   Hypertension  Blood pressure is well controlled at home. Patient denies chest pain and palpitations. Medication compliance: Yes Patient states that she is having intermittent SOB and leg swelling.  Diabetes medication compliance: compliant most of the time, diabetic diet compliance: noncompliant much of the time, home glucose monitoring: is not performed, last eye exam approximately 2 months  ago  Hyperlipidemia Compliance with treatment has been poor.  Patient denies muscle pain associated with her medications.  She is not exercising and is not adherent to a low-salt, ADA, heart healthy or carbohydrate modified diet.   Review of Systems  Constitutional: Negative.   HENT: Negative.   Eyes: Negative.   Respiratory: Negative.   Cardiovascular: Negative.   Gastrointestinal: Negative.   Genitourinary: Negative.   Musculoskeletal: Positive for joint pain (left lower extremity).  Skin: Negative.   Neurological: Negative.   Psychiatric/Behavioral: Negative.     OBJECTIVE:  BP (!) 147/92 (BP Location: Left Arm, Patient Position: Sitting, Cuff Size: Normal)   Pulse 73   Temp 98 F (36.7 C) (Oral)   Resp 16   Ht 5\' 2"  (1.575 m)   Wt 182 lb (82.6 kg)   SpO2 100%   BMI 33.29 kg/m   Physical Exam  Constitutional: She is oriented to person, place, and time and well-developed, well-nourished, and in no distress. No distress.  HENT:  Head: Normocephalic and atraumatic.  Eyes: Pupils  are equal, round, and reactive to light. Conjunctivae and EOM are normal.  Neck: Normal range of motion. Neck supple.  Cardiovascular: Normal rate, regular rhythm and intact distal pulses. Exam reveals no gallop and no friction rub.  No murmur heard. Pulmonary/Chest: Effort normal and breath sounds normal. No respiratory distress. She has no wheezes.  Abdominal: Soft. Bowel sounds are normal. There is no tenderness.  Musculoskeletal: Normal range of motion. She exhibits no edema or tenderness.  Lymphadenopathy:    She has no cervical adenopathy.  Neurological: She is alert and oriented to person, place, and time. Gait normal.  Skin: Skin is warm and dry.  Psychiatric: Mood, memory, affect and judgment normal.  Nursing note and vitals reviewed.    ASSESSMENT/PLAN:  1. Essential hypertension  - Urinalysis Dipstick - metoprolol tartrate (LOPRESSOR) 25 MG tablet; Take 1 tablet (25 mg total) by mouth 2 (two) times daily.  Dispense: 180 tablet; Refill: 3 - Microalbumin, urine - Comprehensive metabolic panel  2. Diabetes mellitus due to underlying condition with complication, without long-term current use of insulin (HCC)  - HgB A1c - Comprehensive metabolic panel  3. Type 2 diabetes mellitus without complication, without long-term current use of insulin (HCC)  - metFORMIN (GLUCOPHAGE) 500 MG tablet; Take 1 tablet (500 mg total) by mouth 2 (two) times daily with a meal.  Dispense: 180 tablet; Refill: 3 - metFORMIN (GLUCOPHAGE) 500 MG tablet; Take 1 tablet (500 mg total) by mouth 2 (two) times daily with a meal.  Dispense: 180 tablet; Refill: 3  4. Abnormal urinalysis  - metFORMIN (  GLUCOPHAGE) 500 MG tablet; Take 1 tablet (500 mg total) by mouth 2 (two) times daily with a meal.  Dispense: 180 tablet; Refill: 3 - Urine Culture  5. Gastroesophageal reflux disease without esophagitis  - Dexlansoprazole (DEXILANT) 30 MG capsule; Take 1 capsule (30 mg total) by mouth daily.  Dispense: 90  capsule; Refill: 1  6. Hypothyroidism, unspecified type  - Thyroid Panel With TSH  7. Vitamin D deficiency  - VITAMIN D 25 Hydroxy (Vit-D Deficiency, Fractures)     Return to care as scheduled and prn. Patient verbalized understanding and agreed with plan of care.   [name], FNP-BC [facility] [address] [place], [place] [address] [phone]

## 2018-05-13 ENCOUNTER — Telehealth: Payer: Self-pay

## 2018-05-13 LAB — THYROID PANEL WITH TSH
Free Thyroxine Index: 2.1 (ref 1.2–4.9)
T3 Uptake Ratio: 25 % (ref 24–39)
T4, Total: 8.4 ug/dL (ref 4.5–12.0)
TSH: 4.03 u[IU]/mL (ref 0.450–4.500)

## 2018-05-13 LAB — VITAMIN D 25 HYDROXY (VIT D DEFICIENCY, FRACTURES): Vit D, 25-Hydroxy: 10.4 ng/mL — ABNORMAL LOW (ref 30.0–100.0)

## 2018-05-13 LAB — COMPREHENSIVE METABOLIC PANEL
ALT: 12 IU/L (ref 0–32)
AST: 12 IU/L (ref 0–40)
Albumin/Globulin Ratio: 1.4 (ref 1.2–2.2)
Albumin: 4.1 g/dL (ref 3.5–4.8)
Alkaline Phosphatase: 82 IU/L (ref 39–117)
BUN/Creatinine Ratio: 17 (ref 12–28)
BUN: 12 mg/dL (ref 8–27)
Bilirubin Total: <0.2 mg/dL (ref 0.0–1.2)
CO2: 23 mmol/L (ref 20–29)
Calcium: 9.4 mg/dL (ref 8.7–10.3)
Chloride: 101 mmol/L (ref 96–106)
Creatinine, Ser: 0.69 mg/dL (ref 0.57–1.00)
GFR calc Af Amer: 99 mL/min/{1.73_m2} (ref >59)
GFR calc non Af Amer: 86 mL/min/{1.73_m2} (ref >59)
Globulin, Total: 3 g/dL (ref 1.5–4.5)
Glucose: 98 mg/dL (ref 65–99)
Potassium: 4 mmol/L (ref 3.5–5.2)
Sodium: 139 mmol/L (ref 134–144)
Total Protein: 7.1 g/dL (ref 6.0–8.5)

## 2018-05-13 LAB — MICROALBUMIN, URINE: Microalbumin, Urine: 17.5 ug/mL

## 2018-05-13 MED ORDER — VITAMIN D (ERGOCALCIFEROL) 1.25 MG (50000 UNIT) PO CAPS: 50000.0000 [IU] | ORAL_CAPSULE | ORAL | 0 refills | Status: AC

## 2018-05-13 NOTE — Progress Notes (Signed)
Low Vitamin D. All other labs are stable. I will send Vit D to the pharmacy. After completion of the prescription, patient will need otc vitamin D of 1,000units daily.   

## 2018-05-13 NOTE — Telephone Encounter (Signed)
Called and spoke with patient, advised that vitamin D was low and to take rx vitamin D that was sent to pharmacy once weekly until finished. Asked that she start otc vitamin D 1000 units daily after the rx is completed. Patient verbalized understanding. Thanks!

## 2018-05-13 NOTE — Telephone Encounter (Signed)
-----   Message from [name], [place] sent at 05/13/2018 10:02 AM EDT ----- Low Vitamin D. All other labs are stable. I will send Vit D to the pharmacy. After completion of the prescription, patient will need otc vitamin D of 1,000units daily.

## 2018-05-14 ENCOUNTER — Encounter: Payer: Self-pay | Admitting: Family Medicine

## 2018-05-14 LAB — URINE CULTURE

## 2018-05-25 ENCOUNTER — Other Ambulatory Visit: Payer: Self-pay | Admitting: Family Medicine

## 2018-05-31 ENCOUNTER — Other Ambulatory Visit: Payer: Self-pay | Admitting: Family Medicine

## 2018-05-31 ENCOUNTER — Other Ambulatory Visit: Payer: Medicaid Other

## 2018-05-31 ENCOUNTER — Inpatient Hospital Stay: Admission: RE | Admit: 2018-05-31 | Payer: Medicaid Other | Source: Ambulatory Visit

## 2018-05-31 DIAGNOSIS — E039 Hypothyroidism, unspecified: Secondary | ICD-10-CM

## 2018-08-12 ENCOUNTER — Ambulatory Visit: Payer: Medicaid Other | Admitting: Family Medicine

## 2018-09-05 ENCOUNTER — Emergency Department (HOSPITAL_BASED_OUTPATIENT_CLINIC_OR_DEPARTMENT_OTHER): Payer: Medicaid Other

## 2018-09-05 ENCOUNTER — Other Ambulatory Visit: Payer: Self-pay

## 2018-09-05 ENCOUNTER — Encounter (HOSPITAL_BASED_OUTPATIENT_CLINIC_OR_DEPARTMENT_OTHER): Payer: Self-pay | Admitting: *Deleted

## 2018-09-05 ENCOUNTER — Emergency Department (HOSPITAL_BASED_OUTPATIENT_CLINIC_OR_DEPARTMENT_OTHER)
Admission: EM | Admit: 2018-09-05 | Discharge: 2018-09-05 | Disposition: A | Discharge status: Home / Self Care | Payer: Medicaid Other | Attending: Emergency Medicine | Admitting: Emergency Medicine

## 2018-09-05 DIAGNOSIS — R51 Headache: Secondary | ICD-10-CM | POA: Diagnosis present

## 2018-09-05 DIAGNOSIS — E119 Type 2 diabetes mellitus without complications: Secondary | ICD-10-CM | POA: Insufficient documentation

## 2018-09-05 DIAGNOSIS — I10 Essential (primary) hypertension: Secondary | ICD-10-CM | POA: Diagnosis not present

## 2018-09-05 DIAGNOSIS — Z87891 Personal history of nicotine dependence: Secondary | ICD-10-CM | POA: Insufficient documentation

## 2018-09-05 DIAGNOSIS — R42 Dizziness and giddiness: Secondary | ICD-10-CM | POA: Diagnosis not present

## 2018-09-05 DIAGNOSIS — Z79899 Other long term (current) drug therapy: Secondary | ICD-10-CM | POA: Insufficient documentation

## 2018-09-05 DIAGNOSIS — Z7984 Long term (current) use of oral hypoglycemic drugs: Secondary | ICD-10-CM | POA: Insufficient documentation

## 2018-09-05 DIAGNOSIS — E039 Hypothyroidism, unspecified: Secondary | ICD-10-CM | POA: Diagnosis not present

## 2018-09-05 DIAGNOSIS — R519 Headache, unspecified: Secondary | ICD-10-CM

## 2018-09-05 LAB — CBC WITH DIFFERENTIAL/PLATELET
Abs Immature Granulocytes: 0.03 10*3/uL (ref 0.00–0.07)
Basophils Absolute: 0.1 10*3/uL (ref 0.0–0.1)
Basophils Relative: 1 %
Eosinophils Absolute: 0.2 10*3/uL (ref 0.0–0.5)
Eosinophils Relative: 2 %
HCT: 34.2 % — ABNORMAL LOW (ref 36.0–46.0)
Hemoglobin: 9.8 g/dL — ABNORMAL LOW (ref 12.0–15.0)
Immature Granulocytes: 0 %
Lymphocytes Relative: 37 %
Lymphs Abs: 3.1 10*3/uL (ref 0.7–4.0)
MCH: 22.7 pg — ABNORMAL LOW (ref 26.0–34.0)
MCHC: 28.7 g/dL — ABNORMAL LOW (ref 30.0–36.0)
MCV: 79.4 fL — ABNORMAL LOW (ref 80.0–100.0)
Monocytes Absolute: 0.7 10*3/uL (ref 0.1–1.0)
Monocytes Relative: 9 %
Neutro Abs: 4.4 10*3/uL (ref 1.7–7.7)
Neutrophils Relative %: 51 %
Platelets: 300 10*3/uL (ref 150–400)
RBC: 4.31 MIL/uL (ref 3.87–5.11)
RDW: 15.3 % (ref 11.5–15.5)
WBC: 8.5 10*3/uL (ref 4.0–10.5)
nRBC: 0 % (ref 0.0–0.2)

## 2018-09-05 LAB — COMPREHENSIVE METABOLIC PANEL
ALT: 20 U/L (ref 0–44)
AST: 22 U/L (ref 15–41)
Albumin: 3.9 g/dL (ref 3.5–5.0)
Alkaline Phosphatase: 71 U/L (ref 38–126)
Anion gap: 11 (ref 5–15)
BUN: 14 mg/dL (ref 8–23)
CO2: 22 mmol/L (ref 22–32)
Calcium: 8.7 mg/dL — ABNORMAL LOW (ref 8.9–10.3)
Chloride: 102 mmol/L (ref 98–111)
Creatinine, Ser: 0.82 mg/dL (ref 0.44–1.00)
GFR calc Af Amer: >60 mL/min (ref >60)
GFR calc non Af Amer: >60 mL/min (ref >60)
Glucose, Bld: 164 mg/dL — ABNORMAL HIGH (ref 70–99)
Potassium: 3.4 mmol/L — ABNORMAL LOW (ref 3.5–5.1)
Sodium: 135 mmol/L (ref 135–145)
Total Bilirubin: 0.3 mg/dL (ref 0.3–1.2)
Total Protein: 8 g/dL (ref 6.5–8.1)

## 2018-09-05 LAB — TROPONIN I: Troponin I: <0.03 ng/mL (ref <0.03)

## 2018-09-05 LAB — URINALYSIS, ROUTINE W REFLEX MICROSCOPIC
Bilirubin Urine: NEGATIVE
Glucose, UA: NEGATIVE mg/dL
Hgb urine dipstick: NEGATIVE
Ketones, ur: NEGATIVE mg/dL
Nitrite: NEGATIVE
Protein, ur: NEGATIVE mg/dL
Specific Gravity, Urine: 1.01 (ref 1.005–1.030)
pH: 5.5 (ref 5.0–8.0)

## 2018-09-05 LAB — URINALYSIS, MICROSCOPIC (REFLEX)

## 2018-09-05 MED ORDER — ACETAMINOPHEN 500 MG PO TABS
1000.0000 mg | ORAL_TABLET | Freq: Once | ORAL | Status: AC
  Administered 2018-09-05: 1000 mg via ORAL
  Filled 2018-09-05: qty 2

## 2018-09-05 MED ORDER — METOPROLOL TARTRATE 50 MG PO TABS
25.0000 mg | ORAL_TABLET | Freq: Once | ORAL | Status: AC
  Administered 2018-09-05: 25 mg via ORAL
  Filled 2018-09-05: qty 1

## 2018-09-05 NOTE — ED Notes (Signed)
ED Provider at bedside. 

## 2018-09-05 NOTE — ED Provider Notes (Signed)
[facility] Provider Note   CSN: [id number] Arrival date & time: 09/05/18  2094     History   Chief Complaint Chief Complaint  Patient presents with  . Headache    HPI Bailey Walker is a 72 y.o. female.  HPI   Pressure like headache, squeezing pain to the sides of the head, feeling like ears won't pop.  Hands shaky. Hx of DM.  Lightheadedness.  6/10 pain.  Feels like face is hot on both sides.  Denies numbness or weakness.  Seeing corneal specialist this week but no acute changes in vision, feels like when pain is more severe right eye is blurry but does not have worsening of these symptoms now.  Headache was 8-9/10 before.  Headache began last night, comes and goes.  Drank some yogurt drink and went to sleep.  No trouble talking or walking.   No nausea or vomiting.  Chronic dyspnea not changed, chronic mucus.   No hx of headache like this. No fevers or falls, no trauma.  Has lightheadedness, daughter reports she has been seen for this before and this is a frequent concern. No black or bloody stools, no infectious symptoms  Past Medical History:  Diagnosis Date  . Diabetes mellitus without complication ([place])   . Hypertension   . SVT (supraventricular tachycardia) ([place])   . Thyroid disease     Patient Active Problem List   Diagnosis Date Noted  . Diabetes mellitus due to underlying condition with unspecified complications ([place]) 70/96/2836  . Ex-smoker 08/26/2017  . Essential hypertension 01/11/2017  . Hypothyroidism 01/08/2017  . SVT (supraventricular tachycardia) ([place]) 01/08/2017    Past Surgical History:  Procedure Laterality Date  . APPENDECTOMY    . BREAST SURGERY    . EYE SURGERY Bilateral    april and march 2019 for cataracts.   . fatty gland     left wrist , right breast  . KNEE SURGERY     left knee     OB History   No obstetric history on file.      Home Medications    Prior to Admission medications   Medication  Sig Start Date End Date Taking? Authorizing Provider  Blood Pressure Monitoring (BLOOD PRESSURE MONITOR/M CUFF) MISC 1 each by Does not apply route 2 (two) times daily. 01/08/17   Dorena Dew, FNP  cetirizine (ZYRTEC) 10 MG tablet Take 1 tablet (10 mg total) by mouth daily. Patient not taking: Reported on 02/08/2018 11/09/17   Dorena Dew, FNP  Dexlansoprazole (DEXILANT) 30 MG capsule Take 1 capsule (30 mg total) by mouth daily. 01/29/17   Dorena Dew, FNP  Dexlansoprazole (DEXILANT) 30 MG capsule Take 1 capsule (30 mg total) by mouth daily. 05/12/18   [name], FNP  levothyroxine (SYNTHROID, LEVOTHROID) 75 MCG tablet TAKE 1 TABLET BY MOUTH EVERY DAY BEFORE BREAKFAST 05/31/18   [name], FNP  metFORMIN (GLUCOPHAGE) 500 MG tablet Take 1 tablet (500 mg total) by mouth 2 (two) times daily with a meal. 05/12/18   [name], FNP  metFORMIN (GLUCOPHAGE) 500 MG tablet Take 1 tablet (500 mg total) by mouth 2 (two) times daily with a meal. 05/12/18 08/10/18  [name], FNP  metoprolol tartrate (LOPRESSOR) 25 MG tablet Take 1 tablet (25 mg total) by mouth 2 (two) times daily. 05/12/18   [name], FNP  nitroGLYCERIN (NITROSTAT) 0.4 MG SL tablet Place 1 tablet (0.4 mg total) under the tongue every 5 (five) minutes as needed  for chest pain. Patient not taking: Reported on 11/09/2017 08/26/17 11/24/17  Revankar, Reita Cliche, MD    Family History Family History  Problem Relation Age of Onset  . Breast cancer Cousin        mat and pat sides  . Colon cancer Neg Hx   . Esophageal cancer Neg Hx   . Rectal cancer Neg Hx     Social History Social History   Tobacco Use  . Smoking status: Former Smoker    Last attempt to quit: 12/29/2013    Years since quitting: 4.6  . Smokeless tobacco: Never Used  Substance Use Topics  . Alcohol use: No  . Drug use: No     Allergies   Ampicillin   Review of Systems Review of Systems  Constitutional: Negative for fever.  HENT:  Negative for sore throat.   Eyes: Positive for visual disturbance.  Respiratory: Negative for cough and shortness of breath.   Cardiovascular: Negative for chest pain.  Gastrointestinal: Negative for abdominal pain, constipation, diarrhea, nausea and vomiting.  Genitourinary: Negative for difficulty urinating and dysuria.  Musculoskeletal: Negative for back pain and neck pain.  Skin: Negative for rash.  Neurological: Positive for light-headedness and headaches. Negative for syncope, facial asymmetry, speech difficulty, weakness and numbness.     Physical Exam Updated Vital Signs BP (!) 150/80   Pulse 81   Temp 98.7 F (37.1 C) (Oral)   Resp 19   Ht 5\' 1"  (1.549 m)   Wt 79.8 kg   SpO2 99%   BMI 33.25 kg/m   Physical Exam Vitals signs and nursing note reviewed.  Constitutional:      General: She is not in acute distress.    Appearance: She is well-developed. She is not diaphoretic.  HENT:     Head: Normocephalic and atraumatic.  Eyes:     Conjunctiva/sclera: Conjunctivae normal.  Neck:     Musculoskeletal: Normal range of motion.  Cardiovascular:     Rate and Rhythm: Normal rate and regular rhythm.     Heart sounds: Normal heart sounds. No murmur. No friction rub. No gallop.   Pulmonary:     Effort: Pulmonary effort is normal. No respiratory distress.     Breath sounds: Normal breath sounds. No wheezing or rales.  Abdominal:     General: There is no distension.     Palpations: Abdomen is soft.     Tenderness: There is no abdominal tenderness. There is no guarding.  Musculoskeletal:        General: No tenderness.  Skin:    General: Skin is warm and dry.     Findings: No erythema or rash.  Neurological:     Mental Status: She is alert and oriented to person, place, and time.     GCS: GCS eye subscore is 4. GCS verbal subscore is 5. GCS motor subscore is 6.     Cranial Nerves: Cranial nerves are intact.     Sensory: Sensation is intact.     Motor: Tremor (mild  intention) present. No weakness.     Coordination: Coordination is intact. Finger-Nose-Finger Test normal.      ED Treatments / Results  Labs (all labs ordered are listed, but only abnormal results are displayed) Labs Reviewed  URINALYSIS, ROUTINE W REFLEX MICROSCOPIC - Abnormal; Notable for the following components:      Result Value   Color, Urine STRAW (*)    Leukocytes, UA SMALL (*)    All other components within normal  limits  CBC WITH DIFFERENTIAL/PLATELET - Abnormal; Notable for the following components:   Hemoglobin 9.8 (*)    HCT 34.2 (*)    MCV 79.4 (*)    MCH 22.7 (*)    MCHC 28.7 (*)    All other components within normal limits  COMPREHENSIVE METABOLIC PANEL - Abnormal; Notable for the following components:   Potassium 3.4 (*)    Glucose, Bld 164 (*)    Calcium 8.7 (*)    All other components within normal limits  URINALYSIS, MICROSCOPIC (REFLEX) - Abnormal; Notable for the following components:   Bacteria, UA FEW (*)    All other components within normal limits  URINE CULTURE  TROPONIN I    EKG EKG Interpretation  Date/Time:  Sunday September 05 2018 20:31:10 EST Ventricular Rate:  89 PR Interval:    QRS Duration: 78 QT Interval:  343 QTC Calculation: 418 R Axis:   16 Text Interpretation:  Normal sinus rhythm Low voltage, precordial leads Abnormal R-wave progression, early transition Borderline repolarization abnormality Baseline wander in lead(s) II III aVF V1 V2 No significant change since last tracing Confirmed by Ekta Dancer ([id number]) on 09/05/2018 9:20:56 PM   Radiology Ct Head Wo Contrast  Result Date: 09/05/2018 CLINICAL DATA:  Headache, blurred vision. EXAM: CT HEAD WITHOUT CONTRAST TECHNIQUE: Contiguous axial images were obtained from the base of the skull through the vertex without intravenous contrast. COMPARISON:  02/03/2017 FINDINGS: Brain: No acute intracranial abnormality. Specifically, no hemorrhage, hydrocephalus, mass lesion, acute  infarction, or significant intracranial injury. Vascular: No hyperdense vessel or unexpected calcification. Skull: No acute calvarial abnormality. Sinuses/Orbits: Visualized paranasal sinuses and mastoids clear. Orbital soft tissues unremarkable. Other: None IMPRESSION: No acute intracranial abnormality. Electronically Signed   By: Kevin  Dover M.D.   On: 09/05/2018 20:26    Procedures Procedures (including critical care time)  Medications Ordered in ED Medications  metoprolol tartrate (LOPRESSOR) tablet 25 mg (25 mg Oral Given 09/05/18 2029)  acetaminophen (TYLENOL) tablet 1,000 mg (1,000 mg Oral Given 09/05/18 2027)     Initial Impression / Assessment and Plan / ED Course  I have reviewed the triage vital signs and the nursing notes.  Pertinent labs & imaging results that were available during my care of the patient were reviewed by me and considered in my medical decision making (see chart for details).     72  year old female with history above including history of diabetes and hypertension, presents with concern for headache and lightheadedness.  Regarding lightheadedness, EKG was done which showed normal sinus rhythm.  Hemoglobin is mildly decreased from before, however no symptoms to suggest acute GI bleed, and I recommend follow-up with primary care physician.  No significant electrolyte abnormalities.  Urinalysis shows no sign of infection.  Regarding headache, CT head done which shows no acute abnormalities.  No temporal tenderness, doubt GCA. Normal neurologic exam, no sign of CVA by history or exam.  Possible tension headache versus other, recommend PCP follow up. Patient discharged in stable condition with understanding of reasons to return.   Final Clinical Impressions(s) / ED Diagnoses   Final diagnoses:  Acute nonintractable headache, unspecified headache type  Lightheaded    ED Discharge Orders    None       Gareth Morgan, MD 09/06/18 [phone]

## 2018-09-05 NOTE — ED Triage Notes (Signed)
Pt here with her niece who is translating. Pt c/o headache and elevated BP since last night. She speaks arabic, aramaic, and french

## 2018-09-05 NOTE — ED Notes (Addendum)
Pt states her "head is heavy, and she feels like her b/p is high, she also feels dizzy". Pt states she was unable to go to sleep until 4 am r/t headache. She states it feels like her head is being squeezed. Pt states she has not taken any medication other than metformin and metoprolol today-no medication for pain. Pt denies nausea or vomiting

## 2018-09-07 LAB — URINE CULTURE

## 2018-09-20 DIAGNOSIS — H1812 Bullous keratopathy, left eye: Secondary | ICD-10-CM | POA: Insufficient documentation

## 2018-09-20 DIAGNOSIS — H18453 Nodular corneal degeneration, bilateral: Secondary | ICD-10-CM | POA: Insufficient documentation

## 2018-09-20 DIAGNOSIS — H18519 Endothelial corneal dystrophy, unspecified eye: Secondary | ICD-10-CM | POA: Insufficient documentation

## 2018-09-29 ENCOUNTER — Telehealth: Payer: Self-pay

## 2018-09-29 DIAGNOSIS — E039 Hypothyroidism, unspecified: Secondary | ICD-10-CM

## 2018-09-29 MED ORDER — LEVOTHYROXINE SODIUM 75 MCG PO TABS: ORAL_TABLET | ORAL | 0 refills | Status: DC

## 2018-09-29 NOTE — Telephone Encounter (Signed)
Refill sent into pharmacy. Thanks!  

## 2018-10-04 ENCOUNTER — Encounter: Payer: Self-pay | Admitting: Family Medicine

## 2018-10-04 ENCOUNTER — Ambulatory Visit (INDEPENDENT_AMBULATORY_CARE_PROVIDER_SITE_OTHER): Payer: Medicaid Other | Admitting: Family Medicine

## 2018-10-04 VITALS — BP 120/55 | HR 77 | Temp 97.9°F | Resp 16 | Ht 62.0 in | Wt 175.0 lb

## 2018-10-04 DIAGNOSIS — E088 Diabetes mellitus due to underlying condition with unspecified complications: Secondary | ICD-10-CM | POA: Diagnosis not present

## 2018-10-04 DIAGNOSIS — I10 Essential (primary) hypertension: Secondary | ICD-10-CM

## 2018-10-04 DIAGNOSIS — E039 Hypothyroidism, unspecified: Secondary | ICD-10-CM | POA: Diagnosis not present

## 2018-10-04 DIAGNOSIS — H6993 Unspecified Eustachian tube disorder, bilateral: Secondary | ICD-10-CM

## 2018-10-04 LAB — POCT URINALYSIS DIPSTICK
Bilirubin, UA: NEGATIVE
Glucose, UA: NEGATIVE
Ketones, UA: NEGATIVE
Nitrite, UA: NEGATIVE
Protein, UA: NEGATIVE
Spec Grav, UA: >=1.03 — AB (ref 1.010–1.025)
Urobilinogen, UA: 0.2 E.U./dL
pH, UA: 5.5 (ref 5.0–8.0)

## 2018-10-04 MED ORDER — MECLIZINE HCL 25 MG PO TABS: 25.0000 mg | ORAL_TABLET | Freq: Three times a day (TID) | ORAL | 0 refills | Status: DC | PRN

## 2018-10-04 MED ORDER — LEVOTHYROXINE SODIUM 75 MCG PO TABS: ORAL_TABLET | ORAL | 0 refills | Status: DC

## 2018-10-04 MED ORDER — METHYLPREDNISOLONE 4 MG PO TBPK: ORAL_TABLET | ORAL | 0 refills | Status: DC

## 2018-10-04 NOTE — Patient Instructions (Addendum)
Coricidan HBP is an over the counter medication that you can use to help with cough and congestion.    Eustachian Tube Dysfunction  Eustachian tube dysfunction refers to a condition in which a blockage develops in the narrow passage that connects the middle ear to the back of the nose (eustachian tube). The eustachian tube regulates air pressure in the middle ear by letting air move between the ear and nose. It also helps to drain fluid from the middle ear space. Eustachian tube dysfunction can affect one or both ears. When the eustachian tube does not function properly, air pressure, fluid, or both can build up in the middle ear. What are the causes? This condition occurs when the eustachian tube becomes blocked or cannot open normally. Common causes of this condition include:  Ear infections.  Colds and other infections that affect the nose, mouth, and throat (upper respiratory tract).  Allergies.  Irritation from cigarette smoke.  Irritation from stomach acid coming up into the esophagus (gastroesophageal reflux). The esophagus is the tube that carries food from the mouth to the stomach.  Sudden changes in air pressure, such as from descending in an airplane or scuba diving.  Abnormal growths in the nose or throat, such as: ? Growths that line the nose (nasal polyps). ? Abnormal growth of cells (tumors). ? Enlarged tissue at the back of the throat (adenoids). What increases the risk? You are more likely to develop this condition if:  You smoke.  You are overweight.  You are a child who has: ? Certain birth defects of the mouth, such as cleft palate. ? Large tonsils or adenoids. What are the signs or symptoms? Common symptoms of this condition include:  A feeling of fullness in the ear.  Ear pain.  Clicking or popping noises in the ear.  Ringing in the ear.  Hearing loss.  Loss of balance.  Dizziness. Symptoms may get worse when the air pressure around you  changes, such as when you travel to an area of high elevation, fly on an airplane, or go scuba diving. How is this diagnosed? This condition may be diagnosed based on:  Your symptoms.  A physical exam of your ears, nose, and throat.  Tests, such as those that measure: ? The movement of your eardrum (tympanogram). ? Your hearing (audiometry). How is this treated? Treatment depends on the cause and severity of your condition.  In mild cases, you may relieve your symptoms by moving air into your ears. This is called "popping the ears."  In more severe cases, or if you have symptoms of fluid in your ears, treatment may include: ? Medicines to relieve congestion (decongestants). ? Medicines that treat allergies (antihistamines). ? Nasal sprays or ear drops that contain medicines that reduce swelling (steroids). ? A procedure to drain the fluid in your eardrum (myringotomy). In this procedure, a small tube is placed in the eardrum to:  Drain the fluid.  Restore the air in the middle ear space. ? A procedure to insert a balloon device through the nose to inflate the opening of the eustachian tube (balloon dilation). Follow these instructions at home: Lifestyle  Do not do any of the following until your health care provider approves: ? Travel to high altitudes. ? Fly in airplanes. ? Work in a Pension scheme manager or room. ? Scuba dive.  Do not use any products that contain nicotine or tobacco, such as cigarettes and e-cigarettes. If you need help quitting, ask your health care provider.  Keep your ears dry. Wear fitted earplugs during showering and bathing. Dry your ears completely after. General instructions  Take over-the-counter and prescription medicines only as told by your health care provider.  Use techniques to help pop your ears as recommended by your health care provider. These may include: ? Chewing gum. ? Yawning. ? Frequent, forceful swallowing. ? Closing your mouth,  holding your nose closed, and gently blowing as if you are trying to blow air out of your nose.  Keep all follow-up visits as told by your health care provider. This is important. Contact a health care provider if:  Your symptoms do not go away after treatment.  Your symptoms come back after treatment.  You are unable to pop your ears.  You have: ? A fever. ? Pain in your ear. ? Pain in your head or neck. ? Fluid draining from your ear.  Your hearing suddenly changes.  You become very dizzy.  You lose your balance. Summary  Eustachian tube dysfunction refers to a condition in which a blockage develops in the eustachian tube.  It can be caused by ear infections, allergies, inhaled irritants, or abnormal growths in the nose or throat.  Symptoms include ear pain, hearing loss, or ringing in the ears.  Mild cases are treated with maneuvers to unblock the ears, such as yawning or ear popping.  Severe cases are treated with medicines. Surgery may also be done (rare). This information is not intended to replace advice given to you by your health care provider. Make sure you discuss any questions you have with your health care provider. Document Released: 10/05/2015 Document Revised: 12/29/2017 Document Reviewed: 12/29/2017 Elsevier Interactive Patient Education  2019 [employer].

## 2018-10-04 NOTE — Progress Notes (Signed)
Patient [facility]   Progress Note: General Provider: [name], FNP  SUBJECTIVE:   Bailey Walker is a 73 y.o. female who  has a past medical history of Diabetes mellitus without complication ([place]), Hypertension, SVT (supraventricular tachycardia) ([place]), and Thyroid disease.. Patient presents today for Hypertension and Diabetes Patient is accompanied by niece who serves as interpreter.  She reports that patient did not receive a knee replacement surgery in [place].  Her last A1C was 6.9 on 05/12/2018. Labs normal except low vitamin D. She reports not taking the Vitamin D due to not being able to swallow it.  Patient states that she is continuing to have dizziness, ear fullness, and cough.   She is planned to have a corneal transplant in February. Patient reported to have bilateral cataracts that have been removed sometime last year.  Patient states that she received lab work while in [place] in October. Scanned into chart. She states that she was given Cipro for UTI and vitamin D3 for deficiency,  but did not take either.    Review of Systems  Constitutional: Negative.   HENT: Positive for congestion.   Eyes: Negative.   Respiratory: Positive for cough. Negative for sputum production.   Cardiovascular: Negative.   Gastrointestinal: Negative.   Genitourinary: Negative.   Musculoskeletal: Negative.   Skin: Negative.   Neurological: Negative.   Endo/Heme/Allergies: Negative for polydipsia.  Psychiatric/Behavioral: Negative.      OBJECTIVE: BP (!) 120/55 (BP Location: Left Arm, Patient Position: Sitting, Cuff Size: Large)   Pulse 77   Temp 97.9 F (36.6 C) (Oral)   Resp 16   Ht 5\' 2"  (1.575 m)   Wt 175 lb (79.4 kg)   SpO2 100%   BMI 32.01 kg/m   Wt Readings from Last 3 Encounters:  10/04/18 175 lb (79.4 kg)  09/05/18 176 lb (79.8 kg)  05/12/18 182 lb (82.6 kg)     Physical Exam Vitals signs and nursing note reviewed.    Constitutional:      General: She is not in acute distress.    Appearance: Normal appearance. She is well-developed. She is obese.  HENT:     Head: Normocephalic and atraumatic.     Right Ear: A middle ear effusion is present.     Left Ear: A middle ear effusion is present.     Mouth/Throat:     Mouth: Mucous membranes are moist.     Dentition: Abnormal dentition (edentuous).     Pharynx: Oropharynx is clear. Uvula midline. No oropharyngeal exudate, posterior oropharyngeal erythema or uvula swelling.  Eyes:     Conjunctiva/sclera: Conjunctivae normal.     Pupils: Pupils are equal, round, and reactive to light.  Neck:     Musculoskeletal: Normal range of motion.  Cardiovascular:     Rate and Rhythm: Normal rate and regular rhythm.     Heart sounds: Normal heart sounds.  Pulmonary:     Effort: Pulmonary effort is normal. No respiratory distress.     Breath sounds: Normal breath sounds.  Abdominal:     General: Bowel sounds are normal. There is no distension.     Palpations: Abdomen is soft.  Musculoskeletal: Normal range of motion.  Skin:    General: Skin is warm and dry.  Neurological:     Mental Status: She is alert and oriented to person, place, and time.  Psychiatric:        Behavior: Behavior normal.  Thought Content: Thought content normal.     ASSESSMENT/PLAN:  1. Essential hypertension Continue with current medications.  - Urinalysis Dipstick  2. Diabetes mellitus due to underlying condition with unspecified complications ([place]) No medication changes. A1C in [place] 6.5 and is at goal. Will continue to monitor.   3. Eustachian tube disorder, bilateral - meclizine (ANTIVERT) 25 MG tablet; Take 1 tablet (25 mg total) by mouth 3 (three) times daily as needed for dizziness.  Dispense: 30 tablet; Refill: 0 - methylPREDNISolone (MEDROL DOSEPAK) 4 MG TBPK tablet; Take as directed on pack  Dispense: 21 tablet; Refill: 0  4. Hypothyroidism, unspecified type Refilled  medications.  - levothyroxine (SYNTHROID, LEVOTHROID) 75 MCG tablet; Take 1 tablet by mouth once daily  Dispense: 90 tablet; Refill: 0    Return in about 3 months (around 01/03/2019) for Labs DM.    The patient was given clear instructions to go to ER or return to medical center if symptoms do not improve, worsen or new problems develop. The patient verbalized understanding and agreed with plan of care.   [name], FNP-BC [facility] [address] [place], [place] [address] [phone]

## 2018-10-31 DIAGNOSIS — Z947 Corneal transplant status: Secondary | ICD-10-CM | POA: Insufficient documentation

## 2019-01-03 ENCOUNTER — Ambulatory Visit: Payer: Medicaid Other | Admitting: Family Medicine

## 2019-02-10 ENCOUNTER — Telehealth: Payer: Self-pay

## 2019-02-10 NOTE — Telephone Encounter (Signed)
Called to do COVID Screening for appointment tomorrow. No answer. Left a message to call back. Thanks! 

## 2019-02-11 ENCOUNTER — Encounter: Payer: Self-pay | Admitting: Family Medicine

## 2019-02-11 ENCOUNTER — Ambulatory Visit (INDEPENDENT_AMBULATORY_CARE_PROVIDER_SITE_OTHER): Payer: Medicaid Other | Admitting: Family Medicine

## 2019-02-11 ENCOUNTER — Other Ambulatory Visit: Payer: Self-pay

## 2019-02-11 VITALS — BP 118/67 | HR 92 | Temp 98.4°F | Resp 16 | Ht 62.0 in | Wt 174.0 lb

## 2019-02-11 DIAGNOSIS — E039 Hypothyroidism, unspecified: Secondary | ICD-10-CM

## 2019-02-11 DIAGNOSIS — E088 Diabetes mellitus due to underlying condition with unspecified complications: Secondary | ICD-10-CM | POA: Diagnosis not present

## 2019-02-11 DIAGNOSIS — I10 Essential (primary) hypertension: Secondary | ICD-10-CM | POA: Diagnosis not present

## 2019-02-11 LAB — POCT URINALYSIS DIPSTICK
Bilirubin, UA: NEGATIVE
Blood, UA: NEGATIVE
Glucose, UA: NEGATIVE
Ketones, UA: NEGATIVE
Nitrite, UA: NEGATIVE
Protein, UA: NEGATIVE
Spec Grav, UA: 1.01 (ref 1.010–1.025)
Urobilinogen, UA: 0.2 E.U./dL
pH, UA: 5.5 (ref 5.0–8.0)

## 2019-02-11 LAB — POCT GLYCOSYLATED HEMOGLOBIN (HGB A1C): Hemoglobin A1C: 7.1 % — AB (ref 4.0–5.6)

## 2019-02-11 MED ORDER — GLIPIZIDE 5 MG PO TABS: 5.0000 mg | ORAL_TABLET | Freq: Two times a day (BID) | ORAL | 3 refills | Status: DC

## 2019-02-11 NOTE — Progress Notes (Signed)
Patient [name]   Progress Note: General Provider: [name], FNP  SUBJECTIVE:   Bailey Walker is a 73 y.o. female who  has a past medical history of Diabetes mellitus without complication ([place]), Hypertension, SVT (supraventricular tachycardia) ([place]), and Thyroid disease.. Patient presents today for Hypertension; Hypothyroidism; Chest Pain (Patient states chest pain since 2013 (daughter says its congestion) ); Diabetes; and Medication Problem (patient feels like she looses energy when taking metformin)  Patient presents with her niece who serves as the interpreter. She states that Bailey Walker feels drained with the use of metformin and is asking for a medication change. She reports compliance with medications. She does not check her blood glucose regularly. Is not following a carb modified diet or participating in regular exercise.   Review of Systems  Constitutional: Positive for malaise/fatigue.  HENT: Negative.   Eyes: Negative.   Respiratory: Negative.   Cardiovascular: Negative.   Gastrointestinal: Negative.   Genitourinary: Negative.   Musculoskeletal: Negative.   Skin: Negative.   Neurological: Negative.   Psychiatric/Behavioral: Negative.      OBJECTIVE: BP 118/67 (BP Location: Right Arm, Patient Position: Sitting, Cuff Size: Normal)   Pulse 92   Temp 98.4 F (36.9 C) (Oral)   Resp 16   Ht 5\' 2"  (1.575 m)   Wt 174 lb (78.9 kg)   SpO2 99%   BMI 31.83 kg/m   Wt Readings from Last 3 Encounters:  02/11/19 174 lb (78.9 kg)  10/04/18 175 lb (79.4 kg)  09/05/18 176 lb (79.8 kg)     Physical Exam Vitals signs and nursing note reviewed.  Constitutional:      General: She is not in acute distress.    Appearance: She is well-developed.  HENT:     Head: Normocephalic and atraumatic.  Eyes:     Conjunctiva/sclera: Conjunctivae normal.     Pupils: Pupils are equal, round, and reactive to light.  Neck:     Musculoskeletal:  Normal range of motion.  Cardiovascular:     Rate and Rhythm: Normal rate and regular rhythm.     Heart sounds: Normal heart sounds.  Pulmonary:     Effort: Pulmonary effort is normal. No respiratory distress.     Breath sounds: Normal breath sounds.  Abdominal:     General: Bowel sounds are normal. There is no distension.     Palpations: Abdomen is soft.  Musculoskeletal: Normal range of motion.  Skin:    General: Skin is warm and dry.  Neurological:     Mental Status: She is alert and oriented to person, place, and time.  Psychiatric:        Behavior: Behavior normal.        Thought Content: Thought content normal.     ASSESSMENT/PLAN:   1. Essential hypertension - Urinalysis Dipstick  2. Diabetes mellitus due to underlying condition with unspecified complications ([facility]) - HgB A1c - glipiZIDE (GLUCOTROL) 5 MG tablet; Take 1 tablet (5 mg total) by mouth 2 (two) times daily before a meal.  Dispense: 60 tablet; Refill: 3 - Ambulatory referral to Podiatry - Comprehensive metabolic panel  3. Hypothyroidism, unspecified type - Thyroid Panel With TSH   Return in about 3 months (around 05/14/2019) for DM2. Medication changes.    The patient was given clear instructions to go to ER or return to medical center if symptoms do not improve, worsen or new problems develop. The patient verbalized understanding and agreed with plan of care.  [name], FNP-BC [facility] [address] [place],  [address] [phone]

## 2019-02-11 NOTE — Patient Instructions (Addendum)
Please stop metformin. I sent a medication called Glipizide 5 mg to the pharmacy. You will take this 2 times a day with your meals. You can take a low dose of an antihistamine such as Claritin for your cough.  I put in a referral for her to see the podiatrist.  Glipizide Extended-release tablets What is this medicine? GLIPIZIDE (GLIP i zide) helps to treat type 2 diabetes. It is combined with diet and exercise. The medicine helps your body to use insulin better. This medicine may be used for other purposes; ask your health care provider or pharmacist if you have questions. COMMON BRAND NAME(S): Glucotrol XL What should I tell my health care provider before I take this medicine? They need to know if you have any of these conditions: -diabetic ketoacidosis -glucose-6-phosphate dehydrogenase deficiency -heart disease -kidney disease -liver disease -porphyria -severe infection or injury -thyroid disease -an unusual or allergic reaction to glipizide, sulfa drugs, other medicines, foods, dyes, or preservatives -pregnant or trying to get pregnant -breast-feeding How should I use this medicine? Take this medicine by mouth. Follow the directions on the prescription label. Swallow the tablets with a drink of water and take with your breakfast. Take your medicine at the same time each day. Do not take more often than directed. Talk to your pediatrician regarding the use of this medicine in children. Special care may be needed. Elderly patients over 41 years old may have a stronger reaction and need a smaller dose. Overdosage: If you think you have taken too much of this medicine contact a poison control center or emergency room at once. NOTE: This medicine is only for you. Do not share this medicine with others. What if I miss a dose? If you miss a dose, take it as soon as you can. If it is almost time for your next dose, take only that dose. Do not take double or extra doses. What may interact with  this medicine? -bosentan -chloramphenicol -cisapride -medicines for fungal or yeast infections -metoclopramide -probenecid -warfarin Many medications may cause an increase or decrease in blood sugar, these include: -alcohol containing beverages -aspirin and aspirin-like drugs -chloramphenicol -chromium -clarithromycin -female hormones, like estrogens or progestins and birth control pills -heart medicines -isoniazid -female hormones or anabolic steroids -medicines for weight loss -medicines for allergies, asthma, cold, or cough -medicines for mental problems -medicines called MAO Inhibitors like Nardil, Parnate, Marplan, Eldepryl -niacin -NSAIDs, medicines for pain and inflammation, like ibuprofen or naproxen -pentamidine -phenytoin -probenecid -quinolone antibiotics like ciprofloxacin, levofloxacin, ofloxacin -some herbal dietary supplements -steroid medicines like prednisone or cortisone -thyroid medicine -water pills or diuretics This list may not describe all possible interactions. Give your health care provider a list of all the medicines, herbs, non-prescription drugs, or dietary supplements you use. Also tell them if you smoke, drink alcohol, or use illegal drugs. Some items may interact with your medicine. What should I watch for while using this medicine? Visit your doctor or health care professional for regular checks on your progress. A test called the HbA1C (A1C) will be monitored. This is a simple blood test. It measures your blood sugar control over the last 2 to 3 months. You will receive this test every 3 to 6 months. Learn how to check your blood sugar. Learn the symptoms of low and high blood sugar and how to manage them. Always carry a quick-source of sugar with you in case you have symptoms of low blood sugar. Examples include hard sugar candy or glucose tablets.  Make sure others know that you can choke if you eat or drink when you develop serious symptoms of low  blood sugar, such as seizures or unconsciousness. They must get medical help at once. Tell your doctor or health care professional if you have high blood sugar. You might need to change the dose of your medicine. If you are sick or exercising more than usual, you might need to change the dose of your medicine. Do not skip meals. Ask your doctor or health care professional if you should avoid alcohol. Many nonprescription cough and cold products contain sugar or alcohol. These can affect blood sugar. This medicine can make you more sensitive to the sun. Keep out of the sun. If you cannot avoid being in the sun, wear protective clothing and use sunscreen. Do not use sun lamps or tanning beds/booths. Wear a medical ID bracelet or chain, and carry a card that describes your disease and details of your medicine and dosage times. What side effects may I notice from receiving this medicine? Side effects that you should report to your doctor or health care professional as soon as possible: -allergic reactions like skin rash, itching or hives, swelling of the face, lips, or tongue -breathing problems -dark urine -fever, chills, sore throat -signs and symptoms of low blood sugar such as feeling anxious, confusion, dizziness, increased hunger, unusually weak or tired, sweating, shakiness, cold, irritable, headache, blurred vision, fast heartbeat, loss of consciousness -unusual bleeding or bruising -yellowing of the eyes or skin Side effects that usually do not require medical attention (report to your doctor or health care professional if they continue or are bothersome): -diarrhea -dizziness -headache -heartburn -nausea -stomach gas This list may not describe all possible side effects. Call your doctor for medical advice about side effects. You may report side effects to FDA at 1-800-FDA-1088. Where should I keep my medicine? Keep out of the reach of children. Store at room temperature between 15 to 30  degrees C (59 to 86 degrees F). Protect from moisture and humidity. Throw away any unused medicine after the expiration date. NOTE: This sheet is a summary. It may not cover all possible information. If you have questions about this medicine, talk to your doctor, pharmacist, or health care provider.  2019 Elsevier/Gold Standard (2012-12-22 14:32:16)

## 2019-02-12 LAB — COMPREHENSIVE METABOLIC PANEL
ALT: 15 IU/L (ref 0–32)
AST: 15 IU/L (ref 0–40)
Albumin/Globulin Ratio: 1.6 (ref 1.2–2.2)
Albumin: 4.4 g/dL (ref 3.7–4.7)
Alkaline Phosphatase: 83 IU/L (ref 39–117)
BUN/Creatinine Ratio: 20 (ref 12–28)
BUN: 13 mg/dL (ref 8–27)
Bilirubin Total: <0.2 mg/dL (ref 0.0–1.2)
CO2: 20 mmol/L (ref 20–29)
Calcium: 9.3 mg/dL (ref 8.7–10.3)
Chloride: 100 mmol/L (ref 96–106)
Creatinine, Ser: 0.64 mg/dL (ref 0.57–1.00)
GFR calc Af Amer: 102 mL/min/{1.73_m2} (ref >59)
GFR calc non Af Amer: 88 mL/min/{1.73_m2} (ref >59)
Globulin, Total: 2.7 g/dL (ref 1.5–4.5)
Glucose: 104 mg/dL — ABNORMAL HIGH (ref 65–99)
Potassium: 4.5 mmol/L (ref 3.5–5.2)
Sodium: 136 mmol/L (ref 134–144)
Total Protein: 7.1 g/dL (ref 6.0–8.5)

## 2019-02-12 LAB — THYROID PANEL WITH TSH
Free Thyroxine Index: 2.4 (ref 1.2–4.9)
T3 Uptake Ratio: 25 % (ref 24–39)
T4, Total: 9.7 ug/dL (ref 4.5–12.0)
TSH: 1.14 u[IU]/mL (ref 0.450–4.500)

## 2019-02-16 ENCOUNTER — Encounter (HOSPITAL_COMMUNITY): Payer: Self-pay

## 2019-02-16 ENCOUNTER — Telehealth: Payer: Self-pay

## 2019-02-16 NOTE — Telephone Encounter (Signed)
-----   Message from [name], [place] sent at 02/16/2019  9:49 AM EDT ----- The  labs are stable without significant clinical change.  All other results are normal or within acceptable limits. No Medication changes

## 2019-02-16 NOTE — Telephone Encounter (Signed)
Called and spoke with patients Niece (caregiver, on hippa) advised that labs are stable and no medication changes are needed at this time. Asked that she keep next scheduled appointment. Thanks!

## 2019-02-16 NOTE — Progress Notes (Signed)
The  labs are stable without significant clinical change.  All other results are normal or within acceptable limits. No Medication changes   

## 2019-03-21 ENCOUNTER — Other Ambulatory Visit: Payer: Self-pay

## 2019-03-21 ENCOUNTER — Encounter: Payer: Self-pay | Admitting: Podiatry

## 2019-03-21 ENCOUNTER — Ambulatory Visit: Payer: Medicaid Other | Admitting: Podiatry

## 2019-03-21 VITALS — BP 153/85 | Temp 97.3°F

## 2019-03-21 DIAGNOSIS — L600 Ingrowing nail: Secondary | ICD-10-CM

## 2019-03-21 DIAGNOSIS — M79675 Pain in left toe(s): Secondary | ICD-10-CM

## 2019-03-21 DIAGNOSIS — B351 Tinea unguium: Secondary | ICD-10-CM | POA: Diagnosis not present

## 2019-03-21 DIAGNOSIS — E119 Type 2 diabetes mellitus without complications: Secondary | ICD-10-CM | POA: Diagnosis not present

## 2019-03-21 DIAGNOSIS — M79674 Pain in right toe(s): Secondary | ICD-10-CM

## 2019-03-21 NOTE — Patient Instructions (Signed)
Diabetes Mellitus and Foot Care Foot care is an important part of your health, especially when you have diabetes. Diabetes may cause you to have problems because of poor blood flow (circulation) to your feet and legs, which can cause your skin to:  Become thinner and drier.  Break more easily.  Heal more slowly.  Peel and crack. You may also have nerve damage (neuropathy) in your legs and feet, causing decreased feeling in them. This means that you may not notice minor injuries to your feet that could lead to more serious problems. Noticing and addressing any potential problems early is the best way to prevent future foot problems. How to care for your feet Foot hygiene  Wash your feet daily with warm water and mild soap. Do not use hot water. Then, pat your feet and the areas between your toes until they are completely dry. Do not soak your feet as this can dry your skin.  Trim your toenails straight across. Do not dig under them or around the cuticle. File the edges of your nails with an emery board or nail file.  Apply a moisturizing lotion or petroleum jelly to the skin on your feet and to dry, brittle toenails. Use lotion that does not contain alcohol and is unscented. Do not apply lotion between your toes. Shoes and socks  Wear clean socks or stockings every day. Make sure they are not too tight. Do not wear knee-high stockings since they may decrease blood flow to your legs.  Wear shoes that fit properly and have enough cushioning. Always look in your shoes before you put them on to be sure there are no objects inside.  To break in new shoes, wear them for just a few hours a day. This prevents injuries on your feet. Wounds, scrapes, corns, and calluses  Check your feet daily for blisters, cuts, bruises, sores, and redness. If you cannot see the bottom of your feet, use a mirror or ask someone for help.  Do not cut corns or calluses or try to remove them with medicine.  If you  find a minor scrape, cut, or break in the skin on your feet, keep it and the skin around it clean and dry. You may clean these areas with mild soap and water. Do not clean the area with peroxide, alcohol, or iodine.  If you have a wound, scrape, corn, or callus on your foot, look at it several times a day to make sure it is healing and not infected. Check for: ? Redness, swelling, or pain. ? Fluid or blood. ? Warmth. ? Pus or a bad smell. General instructions  Do not cross your legs. This may decrease blood flow to your feet.  Do not use heating pads or hot water bottles on your feet. They may burn your skin. If you have lost feeling in your feet or legs, you may not know this is happening until it is too late.  Protect your feet from hot and cold by wearing shoes, such as at the beach or on hot pavement.  Schedule a complete foot exam at least once a year (annually) or more often if you have foot problems. If you have foot problems, report any cuts, sores, or bruises to your health care provider immediately. Contact a health care provider if:  You have a medical condition that increases your risk of infection and you have any cuts, sores, or bruises on your feet.  You have an injury that is not   healing.  You have redness on your legs or feet.  You feel burning or tingling in your legs or feet.  You have pain or cramps in your legs and feet.  Your legs or feet are numb.  Your feet always feel cold.  You have pain around a toenail. Get help right away if:  You have a wound, scrape, corn, or callus on your foot and: ? You have pain, swelling, or redness that gets worse. ? You have fluid or blood coming from the wound, scrape, corn, or callus. ? Your wound, scrape, corn, or callus feels warm to the touch. ? You have pus or a bad smell coming from the wound, scrape, corn, or callus. ? You have a fever. ? You have a red line going up your leg. Summary  Check your feet every day  for cuts, sores, red spots, swelling, and blisters.  Moisturize feet and legs daily.  Wear shoes that fit properly and have enough cushioning.  If you have foot problems, report any cuts, sores, or bruises to your health care provider immediately.  Schedule a complete foot exam at least once a year (annually) or more often if you have foot problems. This information is not intended to replace advice given to you by your health care provider. Make sure you discuss any questions you have with your health care provider. Document Released: 09/05/2000 Document Revised: 10/21/2017 Document Reviewed: 10/10/2016 Elsevier Patient Education  2020 Elsevier Inc.  

## 2019-03-26 NOTE — Progress Notes (Signed)
Subjective: Bailey Walker presents today referred by [name], FNP for diabetic foot evaluation.  Patient speaks Arabic and is accompanied by her bilingual niece who serves as interpreter on today's.  [name] has been diabetic for about 5 years.  Patient denies any history of foot wounds.  Patient admits occasional burning in her feet at night.  Today, she c/o of painful, discolored, thick toenails which interfere with daily activities.  Most symptomatic if her left hallux. Pain is aggravated when wearing enclosed shoe gear. Denies any redness, drainage or swelling.  Past Medical History:  Diagnosis Date  . Diabetes mellitus without complication ([facility])   . Hypertension   . SVT (supraventricular tachycardia) ([place])   . Thyroid disease     Patient Active Problem List   Diagnosis Date Noted  . 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 Fuchs' corneal dystrophy 09/20/2018  . 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 Diabetes mellitus due to underlying condition with unspecified complications ([place]) 19/75/8832  . Ex-smoker 08/26/2017  . Essential hypertension 01/11/2017  . Hypothyroidism 01/08/2017  . SVT (supraventricular tachycardia) ([place]) 01/08/2017    Past Surgical History:  Procedure Laterality Date  . APPENDECTOMY    . BREAST SURGERY    . EYE SURGERY Bilateral    april and march 2019 for cataracts.   . fatty gland     left wrist , right breast  . KNEE SURGERY     left knee     Current Outpatient Medications:  .  Blood Pressure Monitoring (BLOOD PRESSURE MONITOR/M CUFF) MISC, 1 each by Does not apply route 2 (two) times daily., Disp: 1 each, Rfl: 0 .  Dexlansoprazole (DEXILANT) 30 MG capsule, Take 1 capsule (30 mg total) by mouth daily., Disp: 30 capsule, Rfl: 0 .  Dexlansoprazole (DEXILANT) 30 MG capsule, Take 1 capsule (30 mg total) by mouth daily., Disp:  90 capsule, Rfl: 1 .  glipiZIDE (GLUCOTROL) 5 MG tablet, Take 1 tablet (5 mg total) by mouth 2 (two) times daily before a meal., Disp: 60 tablet, Rfl: 3 .  levothyroxine (SYNTHROID, LEVOTHROID) 75 MCG tablet, Take 1 tablet by mouth once daily, Disp: 90 tablet, Rfl: 0 .  meclizine (ANTIVERT) 25 MG tablet, Take 1 tablet (25 mg total) by mouth 3 (three) times daily as needed for dizziness., Disp: 30 tablet, Rfl: 0 .  metFORMIN (GLUCOPHAGE) 500 MG tablet, Take 1 tablet (500 mg total) by mouth 2 (two) times daily with a meal., Disp: 180 tablet, Rfl: 3 .  metoprolol tartrate (LOPRESSOR) 25 MG tablet, Take 1 tablet (25 mg total) by mouth 2 (two) times daily., Disp: 180 tablet, Rfl: 3 .  moxifloxacin (VIGAMOX) 0.5 % ophthalmic solution, 1 drop in the left eye 4 times a day. Begin 1 day prior to surgery and continue for 1 week after surgery, Disp: , Rfl:  .  prednisoLONE acetate (PRED FORTE) 1 % ophthalmic suspension, Apply to eye., Disp: , Rfl:  .  nitroGLYCERIN (NITROSTAT) 0.4 MG SL tablet, Place 1 tablet (0.4 mg total) under the tongue every 5 (five) minutes as needed for chest pain., Disp: 11 tablet, Rfl: 6  Allergies  Allergen Reactions  . Ampicillin Other (See Comments)    Per allergy test    Social History   Occupational History  . Not on file  Tobacco Use  . Smoking status: Former Smoker    Quit date: 12/29/2013  Years since quitting: 5.2  . Smokeless tobacco: Never Used  Substance and Sexual Activity  . Alcohol use: No  . Drug use: No  . Sexual activity: Not on file    Family History  Problem Relation Age of Onset  . Breast cancer Cousin        mat and pat sides  . Colon cancer Neg Hx   . Esophageal cancer Neg Hx   . Rectal cancer Neg Hx     Immunization History  Administered Date(s) Administered  . Influenza-Unspecified 10/22/2010  . MMR 03/07/2009  . Pneumococcal Conjugate-13 01/08/2017  . Pneumococcal Polysaccharide-23 11/13/2009  . Tdap 03/07/2009, 02/03/2017     Review of systems: Positive Findings in bold print.  Constitutional:  chills, fatigue, fever, sweats, weight change Communication: Optometrist, sign Ecologist, hand writing, iPad/Android device Head: headaches, head injury Eyes: changes in vision, eye pain, glaucoma, cataracts, macular degeneration, diplopia, glare,  light sensitivity, eyeglasses or contacts, blindness Ears nose mouth throat: hearing impaired, hearing aids,  ringing in ears, deaf, sign language,  vertigo, nosebleeds,  rhinitis,  cold sores, snoring, swollen glands Cardiovascular: HTN, edema, arrhythmia, pacemaker in place, defibrillator in place, chest pain/tightness, chronic anticoagulation, blood clot, heart failure, MI Peripheral Vascular: leg cramps, varicose veins, blood clots, lymphedema, varicosities Respiratory:  difficulty breathing, denies congestion, SOB, wheezing, cough, emphysema Gastrointestinal: change in appetite or weight, abdominal pain, constipation, diarrhea, nausea, vomiting, vomiting blood, change in bowel habits, abdominal pain, jaundice, rectal bleeding, hemorrhoids, GERD Genitourinary:  nocturia,  pain on urination, polyuria,  blood in urine, Foley catheter, urinary urgency, ESRD on hemodialysis Musculoskeletal: amputation, cramping, stiff joints, painful joints, decreased joint motion, fractures, OA, gout, hemiplegia, paraplegia, uses cane, wheelchair bound, uses walker, uses rollator Skin: +changes in toenails, color change, dryness, itching, mole changes,  rash, wound(s) Neurological: headaches, numbness in feet, paresthesias in feet, burning in feet, fainting,  seizures, change in speech,  headaches, memory problems/poor historian, cerebral palsy, weakness, paralysis, CVA, TIA Endocrine: diabetes, hypothyroidism, hyperthyroidism,  goiter, dry mouth, flushing, heat intolerance,  cold intolerance,  excessive thirst, denies polyuria,  nocturia Hematological:  easy bleeding, excessive bleeding,  easy bruising, enlarged lymph nodes, on long term blood thinner, history of past transusions Allergy/immunological:  hives, eczema, frequent infections, multiple drug allergies, seasonal allergies, transplant recipient, multiple food allergies Psychiatric:  anxiety, depression, mood disorder, suicidal ideations, hallucinations, insomnia  Objective: Vitals:   03/21/19 0842  BP: (!) 153/85  Temp: (!) 97.3 F (36.3 C)   Vascular Examination: Capillary refill time <3 seconds x 10 digits.  Dorsalis pedis pulses palpable b/l.  Posterior tibial pulses palpable b/l.  Digital hair sparse x 10 digits.  Skin temperature gradient WNL b/l.  Dermatological Examination: Skin with normal turgor, texture and tone b/l.  Toenails 1-5 b/l discolored, thick, dystrophic with subungual debris and pain with palpation to nailbeds due to thickness of nails.  Incurvated nailplate left great toe medial border(s) with tenderness to palpation. No erythema, no edema, no drainage noted.  Musculoskeletal: Muscle strength 5/5 to all LE muscle groups.  No gross bony deformities b/l.   Neurological: Sensation intact 5/5 b/l with 10 gram monofilament.  Vibratory sensation intact b/l.  Assessment: 1. Painful onychomycosis toenails 1-5 b/l  2. NIDDM  Plan: 1. Discussed diabetic foot care principles. Literature dispensed on today. 2. Toenails 1-5 b/l were debrided in length and girth without iatrogenic bleeding. 3. Patient to continue soft, supportive shoe gear. 4. Patient to report any pedal injuries to medical professional immediately. 5.  Follow up 3 months.  6. Patient/POA to call should there be a concern in the interim.

## 2019-03-29 ENCOUNTER — Other Ambulatory Visit: Payer: Self-pay | Admitting: Family Medicine

## 2019-03-29 DIAGNOSIS — E039 Hypothyroidism, unspecified: Secondary | ICD-10-CM

## 2019-04-12 ENCOUNTER — Other Ambulatory Visit: Payer: Self-pay | Admitting: Family Medicine

## 2019-04-12 DIAGNOSIS — K219 Gastro-esophageal reflux disease without esophagitis: Secondary | ICD-10-CM

## 2019-04-19 ENCOUNTER — Encounter (HOSPITAL_BASED_OUTPATIENT_CLINIC_OR_DEPARTMENT_OTHER): Payer: Self-pay | Admitting: Emergency Medicine

## 2019-04-19 ENCOUNTER — Emergency Department (HOSPITAL_BASED_OUTPATIENT_CLINIC_OR_DEPARTMENT_OTHER)
Admission: EM | Admit: 2019-04-19 | Discharge: 2019-04-19 | Disposition: A | Discharge status: Home / Self Care | Payer: Medicaid Other | Attending: Emergency Medicine | Admitting: Emergency Medicine

## 2019-04-19 ENCOUNTER — Other Ambulatory Visit: Payer: Self-pay

## 2019-04-19 ENCOUNTER — Emergency Department (HOSPITAL_BASED_OUTPATIENT_CLINIC_OR_DEPARTMENT_OTHER): Payer: Medicaid Other

## 2019-04-19 DIAGNOSIS — R0789 Other chest pain: Secondary | ICD-10-CM

## 2019-04-19 DIAGNOSIS — E119 Type 2 diabetes mellitus without complications: Secondary | ICD-10-CM | POA: Diagnosis not present

## 2019-04-19 DIAGNOSIS — I10 Essential (primary) hypertension: Secondary | ICD-10-CM | POA: Diagnosis not present

## 2019-04-19 DIAGNOSIS — Z87891 Personal history of nicotine dependence: Secondary | ICD-10-CM | POA: Diagnosis not present

## 2019-04-19 DIAGNOSIS — R0602 Shortness of breath: Secondary | ICD-10-CM | POA: Insufficient documentation

## 2019-04-19 DIAGNOSIS — E038 Other specified hypothyroidism: Secondary | ICD-10-CM | POA: Insufficient documentation

## 2019-04-19 DIAGNOSIS — Z7984 Long term (current) use of oral hypoglycemic drugs: Secondary | ICD-10-CM | POA: Diagnosis not present

## 2019-04-19 DIAGNOSIS — Z79899 Other long term (current) drug therapy: Secondary | ICD-10-CM | POA: Diagnosis not present

## 2019-04-19 DIAGNOSIS — J069 Acute upper respiratory infection, unspecified: Secondary | ICD-10-CM | POA: Insufficient documentation

## 2019-04-19 DIAGNOSIS — R079 Chest pain, unspecified: Secondary | ICD-10-CM | POA: Diagnosis present

## 2019-04-19 DIAGNOSIS — Z20828 Contact with and (suspected) exposure to other viral communicable diseases: Secondary | ICD-10-CM | POA: Diagnosis not present

## 2019-04-19 LAB — CBC WITH DIFFERENTIAL/PLATELET
Abs Immature Granulocytes: 0.03 10*3/uL (ref 0.00–0.07)
Basophils Absolute: 0.1 10*3/uL (ref 0.0–0.1)
Basophils Relative: 1 %
Eosinophils Absolute: 0.3 10*3/uL (ref 0.0–0.5)
Eosinophils Relative: 3 %
HCT: 31 % — ABNORMAL LOW (ref 36.0–46.0)
Hemoglobin: 8.7 g/dL — ABNORMAL LOW (ref 12.0–15.0)
Immature Granulocytes: 0 %
Lymphocytes Relative: 31 %
Lymphs Abs: 3.2 10*3/uL (ref 0.7–4.0)
MCH: 20.9 pg — ABNORMAL LOW (ref 26.0–34.0)
MCHC: 28.1 g/dL — ABNORMAL LOW (ref 30.0–36.0)
MCV: 74.3 fL — ABNORMAL LOW (ref 80.0–100.0)
Monocytes Absolute: 0.9 10*3/uL (ref 0.1–1.0)
Monocytes Relative: 8 %
Neutro Abs: 5.9 10*3/uL (ref 1.7–7.7)
Neutrophils Relative %: 57 %
Platelets: 399 10*3/uL (ref 150–400)
RBC: 4.17 MIL/uL (ref 3.87–5.11)
RDW: 16.7 % — ABNORMAL HIGH (ref 11.5–15.5)
WBC: 10.4 10*3/uL (ref 4.0–10.5)
nRBC: 0 % (ref 0.0–0.2)

## 2019-04-19 LAB — BASIC METABOLIC PANEL
Anion gap: 13 (ref 5–15)
BUN: 12 mg/dL (ref 8–23)
CO2: 22 mmol/L (ref 22–32)
Calcium: 8.7 mg/dL — ABNORMAL LOW (ref 8.9–10.3)
Chloride: 101 mmol/L (ref 98–111)
Creatinine, Ser: 0.79 mg/dL (ref 0.44–1.00)
GFR calc Af Amer: >60 mL/min (ref >60)
GFR calc non Af Amer: >60 mL/min (ref >60)
Glucose, Bld: 169 mg/dL — ABNORMAL HIGH (ref 70–99)
Potassium: 4.2 mmol/L (ref 3.5–5.1)
Sodium: 136 mmol/L (ref 135–145)

## 2019-04-19 LAB — TROPONIN I (HIGH SENSITIVITY)
Troponin I (High Sensitivity): 5 ng/L (ref <18)
Troponin I (High Sensitivity): 4 ng/L (ref <18)

## 2019-04-19 LAB — BRAIN NATRIURETIC PEPTIDE: B Natriuretic Peptide: 55.1 pg/mL (ref 0.0–100.0)

## 2019-04-19 MED ORDER — HYDROCODONE-HOMATROPINE 5-1.5 MG/5ML PO SYRP: 5.0000 mL | ORAL_SOLUTION | Freq: Four times a day (QID) | ORAL | 0 refills | Status: DC | PRN

## 2019-04-19 MED ORDER — ALBUTEROL SULFATE HFA 108 (90 BASE) MCG/ACT IN AERS
2.0000 | INHALATION_SPRAY | RESPIRATORY_TRACT | Status: DC | PRN
  Administered 2019-04-19: 05:00:00 2 via RESPIRATORY_TRACT
  Filled 2019-04-19: qty 6.7

## 2019-04-19 NOTE — ED Provider Notes (Signed)
[facility] Provider Note   CSN: [id number] Arrival date & time: 04/19/19  0021    History   Chief Complaint Chief Complaint  Patient presents with  . Chest Pain    HPI [facility] is a 73 y.o. female.  HPI: A 73 year old patient with a history of treated diabetes, hypertension and obesity presents for evaluation of chest pain. Initial onset of pain was more than 6 hours ago. The patient's chest pain is sharp and is not worse with exertion. The patient's chest pain is middle- or left-sided, is not well-localized, is not described as heaviness/pressure/tightness and does not radiate to the arms/jaw/neck. The patient does not complain of nausea and denies diaphoresis. The patient has no history of stroke, has no history of peripheral artery disease, has not smoked in the past 90 days, has no relevant family history of coronary artery disease (first degree relative at less than age 46) and has no history of hypercholesterolemia.   Patient presents to the emergency department for evaluation of chest congestion, cough and central chest pain.  Pain is described as sharp in nature.  Complaints began 2 days ago, worse today.  She does feel short of breath.  Pain not affected by breathing.  Throat feels dry and scratchy.  She has not noticed any fever.     Past Medical History:  Diagnosis Date  . Diabetes mellitus without complication ([place])   . Hypertension   . SVT (supraventricular tachycardia) ([place])   . Thyroid disease     Patient Active Problem List   Diagnosis Date Noted  . 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 Fuchs' corneal dystrophy 09/20/2018  . 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 Diabetes mellitus due to underlying condition with unspecified complications ([place]) 44/09/270  . Ex-smoker 08/26/2017  . Essential hypertension  01/11/2017  . Hypothyroidism 01/08/2017  . SVT (supraventricular tachycardia) ([place]) 01/08/2017    Past Surgical History:  Procedure Laterality Date  . APPENDECTOMY    . BREAST SURGERY    . EYE SURGERY Bilateral    april and march 2019 for cataracts.   . fatty gland     left wrist , right breast  . KNEE SURGERY     left knee     OB History   No obstetric history on file.      Home Medications    Prior to Admission medications   Medication Sig Start Date End Date Taking? Authorizing Provider  Blood Pressure Monitoring (BLOOD PRESSURE MONITOR/M CUFF) MISC 1 each by Does not apply route 2 (two) times daily. 01/08/17   Dorena Dew, FNP  DEXILANT 30 MG capsule TAKE 1 CAPSULE BY MOUTH EVERY DAY 04/13/19   [name], FNP  Dexlansoprazole (DEXILANT) 30 MG capsule Take 1 capsule (30 mg total) by mouth daily. 01/29/17   Dorena Dew, FNP  glipiZIDE (GLUCOTROL) 5 MG tablet Take 1 tablet (5 mg total) by mouth 2 (two) times daily before a meal. 02/11/19   [name], FNP  HYDROcodone-homatropine [facility]) 5-1.5 MG/5ML syrup Take 5 mLs by mouth every 6 (six) hours as needed for cough. 04/19/19   Orpah Greek, MD  levothyroxine (SYNTHROID) 75 MCG tablet TAKE 1 TABLET BY MOUTH EVERY DAY 03/29/19   [name], FNP  meclizine (ANTIVERT) 25 MG tablet Take 1 tablet (25 mg total) by mouth 3 (three) times daily as needed for dizziness.  10/04/18   [name], FNP  metFORMIN (GLUCOPHAGE) 500 MG tablet Take 1 tablet (500 mg total) by mouth 2 (two) times daily with a meal. 05/12/18   [name], FNP  metoprolol tartrate (LOPRESSOR) 25 MG tablet Take 1 tablet (25 mg total) by mouth 2 (two) times daily. 05/12/18   [name], FNP  moxifloxacin (VIGAMOX) 0.5 % ophthalmic solution 1 drop in the left eye 4 times a day. Begin 1 day prior to surgery and continue for 1 week after surgery 10/24/18   [provider]  nitroGLYCERIN (NITROSTAT) 0.4 MG SL tablet Place 1 tablet  (0.4 mg total) under the tongue every 5 (five) minutes as needed for chest pain. 08/26/17 02/11/19  Revankar, Reita Cliche, MD  prednisoLONE acetate (PRED FORTE) 1 % ophthalmic suspension Apply to eye. 01/14/19   [provider]    Family History Family History  Problem Relation Age of Onset  . Breast cancer Cousin        mat and pat sides  . Colon cancer Neg Hx   . Esophageal cancer Neg Hx   . Rectal cancer Neg Hx     Social History Social History   Tobacco Use  . Smoking status: Former Smoker    Quit date: 12/29/2013    Years since quitting: 5.3  . Smokeless tobacco: Never Used  Substance Use Topics  . Alcohol use: No  . Drug use: No     Allergies   Ampicillin   Review of Systems Review of Systems  HENT: Positive for congestion and sore throat.   Respiratory: Positive for cough and shortness of breath.   Cardiovascular: Positive for chest pain.  All other systems reviewed and are negative.    Physical Exam Updated Vital Signs BP 135/82   Pulse 85   Temp 98.6 F (37 C) (Oral)   Resp 18   Ht 5\' 1"  (1.549 m)   Wt 78.9 kg   SpO2 98%   BMI 32.87 kg/m   Physical Exam Vitals signs and nursing note reviewed.  Constitutional:      General: She is not in acute distress.    Appearance: Normal appearance. She is well-developed.  HENT:     Head: Normocephalic and atraumatic.     Right Ear: Hearing normal.     Left Ear: Hearing normal.     Nose: Nose normal.  Eyes:     Conjunctiva/sclera: Conjunctivae normal.     Pupils: Pupils are equal, round, and reactive to light.  Neck:     Musculoskeletal: Normal range of motion and neck supple.  Cardiovascular:     Rate and Rhythm: Regular rhythm.     Heart sounds: S1 normal and S2 normal. No murmur. No friction rub. No gallop.   Pulmonary:     Effort: Pulmonary effort is normal. No respiratory distress.     Breath sounds: Normal breath sounds.  Chest:     Chest wall: No tenderness.  Abdominal:     General:  Bowel sounds are normal.     Palpations: Abdomen is soft.     Tenderness: There is no abdominal tenderness. There is no guarding or rebound. Negative signs include Murphy's sign and McBurney's sign.     Hernia: No hernia is present.  Musculoskeletal: Normal range of motion.  Skin:    General: Skin is warm and dry.     Findings: No rash.  Neurological:     Mental Status: She is alert and oriented to person, place, and  time.     GCS: GCS eye subscore is 4. GCS verbal subscore is 5. GCS motor subscore is 6.     Cranial Nerves: No cranial nerve deficit.     Sensory: No sensory deficit.     Coordination: Coordination normal.  Psychiatric:        Speech: Speech normal.        Behavior: Behavior normal.        Thought Content: Thought content normal.      ED Treatments / Results  Labs (all labs ordered are listed, but only abnormal results are displayed) Labs Reviewed  CBC WITH DIFFERENTIAL/PLATELET - Abnormal; Notable for the following components:      Result Value   Hemoglobin 8.7 (*)    HCT 31.0 (*)    MCV 74.3 (*)    MCH 20.9 (*)    MCHC 28.1 (*)    RDW 16.7 (*)    All other components within normal limits  BASIC METABOLIC PANEL - Abnormal; Notable for the following components:   Glucose, Bld 169 (*)    Calcium 8.7 (*)    All other components within normal limits  BRAIN NATRIURETIC PEPTIDE  TROPONIN I (HIGH SENSITIVITY)  TROPONIN I (HIGH SENSITIVITY)    EKG EKG Interpretation  Date/Time:  Tuesday April 19 2019 00:35:04 EDT Ventricular Rate:  88 PR Interval:    QRS Duration: 74 QT Interval:  351 QTC Calculation: 425 R Axis:   45 Text Interpretation:  Sinus rhythm Low voltage, precordial leads Abnormal R-wave progression, early transition Baseline wander in lead(s) V1 No significant change since last tracing Confirmed by Orpah Greek [phone]) on 04/19/2019 12:53:29 AM   Radiology Dg Chest 2 View  Result Date: 04/19/2019 CLINICAL DATA:  Mid chest pain x2  days, cough, congestion EXAM: CHEST - 2 VIEW COMPARISON:  08/21/2017 FINDINGS: Lungs are clear.  No pleural effusion or pneumothorax. The heart is normal in size. Visualized osseous structures are within normal limits. IMPRESSION: Normal chest radiographs. Electronically Signed   By: Julian Hy M.D.   On: 04/19/2019 02:00    Procedures Procedures (including critical care time)  Medications Ordered in ED Medications  albuterol (VENTOLIN HFA) 108 (90 Base) MCG/ACT inhaler 2 puff (has no administration in time range)     Initial Impression / Assessment and Plan / ED Course  I have reviewed the triage vital signs and the nursing notes.  Pertinent labs & imaging results that were available during my care of the patient were reviewed by me and considered in my medical decision making (see chart for details).     HEAR Score: 4  Patient presents to the emergency department for evaluation of chest pain.  She has been experiencing a sharp pain in her chest but this has been in the setting of cough, chest congestion and feeling short of breath.  Symptoms are not felt to be consistent with cardiac etiology.  She does, however, have some cardiac risk factors, therefore cardiac evaluation performed.  EKG does not show any evidence of ongoing ischemia or infarct.  Troponin negative x2 without any significant elevation from 1st-2nd troponin.  This is felt to be very good indication that her chest pain is not cardiac in nature.  She has not hypoxic, tachypneic or tachycardic.  Pain is not related to breathing.  Do not suspect PE.  Symptoms consistent with upper respiratory infection, will need to rule out COVID-19 infection.  Treat with analgesia, albuterol.  Final Clinical Impressions(s) / ED  Diagnoses   Final diagnoses:  Atypical chest pain  Upper respiratory tract infection, unspecified type    ED Discharge Orders         Ordered    HYDROcodone-homatropine (HYCODAN) 5-1.5 MG/5ML syrup  Every  6 hours PRN     04/19/19 0457           Orpah Greek, MD 04/19/19 [phone]

## 2019-04-19 NOTE — ED Notes (Signed)
Pt on monitor 

## 2019-04-19 NOTE — ED Triage Notes (Signed)
Patient presents with complaints of chest pain x 2 days; states worse this evening with complaints of shortness of breath. Denies fever; complains of dry throat. NAD noted. States pain in center of chest.

## 2019-04-21 LAB — NOVEL CORONAVIRUS, NAA (HOSP ORDER, SEND-OUT TO REF LAB; TAT 18-24 HRS): SARS-CoV-2, NAA: NOT DETECTED

## 2019-05-03 ENCOUNTER — Other Ambulatory Visit: Payer: Self-pay | Admitting: Family Medicine

## 2019-05-03 DIAGNOSIS — E039 Hypothyroidism, unspecified: Secondary | ICD-10-CM

## 2019-05-16 ENCOUNTER — Other Ambulatory Visit: Payer: Self-pay

## 2019-05-16 ENCOUNTER — Ambulatory Visit (INDEPENDENT_AMBULATORY_CARE_PROVIDER_SITE_OTHER): Payer: Medicaid Other | Admitting: Family Medicine

## 2019-05-16 ENCOUNTER — Encounter: Payer: Self-pay | Admitting: Family Medicine

## 2019-05-16 VITALS — BP 127/49 | HR 87 | Temp 98.7°F | Resp 14 | Ht 62.0 in | Wt 177.0 lb

## 2019-05-16 DIAGNOSIS — R8290 Unspecified abnormal findings in urine: Secondary | ICD-10-CM

## 2019-05-16 DIAGNOSIS — E088 Diabetes mellitus due to underlying condition with unspecified complications: Secondary | ICD-10-CM

## 2019-05-16 DIAGNOSIS — I10 Essential (primary) hypertension: Secondary | ICD-10-CM | POA: Diagnosis not present

## 2019-05-16 DIAGNOSIS — H6993 Unspecified Eustachian tube disorder, bilateral: Secondary | ICD-10-CM | POA: Diagnosis not present

## 2019-05-16 DIAGNOSIS — R05 Cough: Secondary | ICD-10-CM

## 2019-05-16 DIAGNOSIS — R059 Cough, unspecified: Secondary | ICD-10-CM

## 2019-05-16 DIAGNOSIS — I471 Supraventricular tachycardia: Secondary | ICD-10-CM

## 2019-05-16 DIAGNOSIS — L918 Other hypertrophic disorders of the skin: Secondary | ICD-10-CM

## 2019-05-16 LAB — POCT URINALYSIS DIPSTICK
Bilirubin, UA: NEGATIVE
Blood, UA: NEGATIVE
Glucose, UA: POSITIVE — AB
Ketones, UA: NEGATIVE
Nitrite, UA: NEGATIVE
Protein, UA: NEGATIVE
Spec Grav, UA: 1.015 (ref 1.010–1.025)
Urobilinogen, UA: 0.2 E.U./dL
pH, UA: 6 (ref 5.0–8.0)

## 2019-05-16 LAB — POCT GLYCOSYLATED HEMOGLOBIN (HGB A1C): Hemoglobin A1C: 8.3 % — AB (ref 4.0–5.6)

## 2019-05-16 MED ORDER — NITROGLYCERIN 0.4 MG SL SUBL: 0.4000 mg | SUBLINGUAL_TABLET | SUBLINGUAL | 6 refills | Status: DC | PRN

## 2019-05-16 MED ORDER — ALBUTEROL SULFATE HFA 108 (90 BASE) MCG/ACT IN AERS: 2.0000 | INHALATION_SPRAY | Freq: Four times a day (QID) | RESPIRATORY_TRACT | 2 refills | Status: DC | PRN

## 2019-05-16 MED ORDER — METFORMIN HCL 500 MG PO TABS: 250.0000 mg | ORAL_TABLET | Freq: Two times a day (BID) | ORAL | 3 refills | Status: DC

## 2019-05-16 MED ORDER — HYDROCODONE-HOMATROPINE 5-1.5 MG/5ML PO SYRP: 5.0000 mL | ORAL_SOLUTION | Freq: Four times a day (QID) | ORAL | 0 refills | Status: DC | PRN

## 2019-05-16 MED ORDER — MECLIZINE HCL 25 MG PO TABS: 25.0000 mg | ORAL_TABLET | Freq: Three times a day (TID) | ORAL | 0 refills | Status: DC | PRN

## 2019-05-16 NOTE — Progress Notes (Signed)
[facility]   Progress Note: General Provider: [name], FNP  SUBJECTIVE:   [facility] is a 73 y.o. female who  has a past medical history of Diabetes mellitus without complication ([place]), Hypertension, SVT (supraventricular tachycardia) ([place]), and Thyroid disease.. Patient presents today for Hypertension and Diabetes  Since the last visit, patient stopped metformin due to feeling drained. She is taking all other medications as previously prescribed.  She has multiple skin tags around her neck and would like a referral to the dermatologist for removal. She states that she needs refills on her meclizine. Was given cough medications by urgent care and would like a refill.  Review of Systems  Constitutional: Negative.   HENT: Negative.   Eyes: Negative.   Respiratory: Positive for cough. Negative for sputum production and shortness of breath.   Cardiovascular: Negative.   Gastrointestinal: Negative.   Genitourinary: Negative.   Musculoskeletal: Negative.   Skin: Negative.   Neurological: Positive for dizziness.  Psychiatric/Behavioral: Negative.      OBJECTIVE: BP (!) 127/49 (BP Location: Left Arm, Patient Position: Sitting, Cuff Size: Large)   Pulse 87   Temp 98.7 F (37.1 C) (Oral)   Resp 14   Ht 5\' 2"  (1.575 m)   Wt 177 lb (80.3 kg)   SpO2 100%   BMI 32.37 kg/m   Wt Readings from Last 3 Encounters:  05/16/19 177 lb (80.3 kg)  04/19/19 173 lb 15.1 oz (78.9 kg)  02/11/19 174 lb (78.9 kg)     Physical Exam Vitals signs and nursing note reviewed.  Constitutional:      General: She is not in acute distress.    Appearance: Normal appearance.  HENT:     Head: Normocephalic and atraumatic.  Eyes:     Extraocular Movements: Extraocular movements intact.     Conjunctiva/sclera: Conjunctivae normal.     Pupils: Pupils are equal, round, and reactive to light.  Cardiovascular:     Rate and Rhythm: Normal rate and  regular rhythm.     Heart sounds: No murmur.  Pulmonary:     Effort: Pulmonary effort is normal.     Breath sounds: Normal breath sounds.  Musculoskeletal: Normal range of motion.  Skin:    General: Skin is warm and dry.  Neurological:     Mental Status: She is alert and oriented to person, place, and time.  Psychiatric:        Mood and Affect: Mood normal.        Behavior: Behavior normal.        Thought Content: Thought content normal.        Judgment: Judgment normal.     ASSESSMENT/PLAN:   1. Essential hypertension - Urinalysis Dipstick  2. Diabetes mellitus due to underlying condition with unspecified complications (HCC) - HgB A1c - metFORMIN (GLUCOPHAGE) 500 MG tablet; Take 0.5 tablets (250 mg total) by mouth 2 (two) times daily with a meal.  Dispense: 90 tablet; Refill: 3  3. Eustachian tube disorder, bilateral - meclizine (ANTIVERT) 25 MG tablet; Take 1 tablet (25 mg total) by mouth 3 (three) times daily as needed for dizziness.  Dispense: 30 tablet; Refill: 0  4. Cough - albuterol (VENTOLIN HFA) 108 (90 Base) MCG/ACT inhaler; Inhale 2 puffs into the lungs every 6 (six) hours as needed for wheezing or shortness of breath.  Dispense: 6.7 g; Refill: 2 - HYDROcodone-homatropine (HYCODAN) 5-1.5 MG/5ML syrup; Take 5 mLs by mouth every 6 (six) hours as  needed for cough.  Dispense: 75 mL; Refill: 0  5. SVT (supraventricular tachycardia) (HCC) - nitroGLYCERIN (NITROSTAT) 0.4 MG SL tablet; Place 1 tablet (0.4 mg total) under the tongue every 5 (five) minutes as needed for chest pain.  Dispense: 11 tablet; Refill: 6  6. Skin tag - Ambulatory referral to Dermatology  7. Abnormal urinalysis - Urine Culture     Return in about 3 months (around 08/16/2019) for DM2.    The patient was given clear instructions to go to ER or return to medical center if symptoms do not improve, worsen or new problems develop. The patient verbalized understanding and agreed with plan of care.    [name], FNP-BC [facility] [address] [place], [place] [address] [phone]

## 2019-05-16 NOTE — Patient Instructions (Signed)
Diabetes Mellitus and Nutrition, Adult When you have diabetes (diabetes mellitus), it is very important to have healthy eating habits because your blood sugar (glucose) levels are greatly affected by what you eat and drink. Eating healthy foods in the appropriate amounts, at about the same times every day, can help you:  Control your blood glucose.  Lower your risk of heart disease.  Improve your blood pressure.  Reach or maintain a healthy weight. Every person with diabetes is different, and each person has different needs for a meal plan. Your health care provider may recommend that you work with a diet and nutrition specialist (dietitian) to make a meal plan that is best for you. Your meal plan may vary depending on factors such as:  The calories you need.  The medicines you take.  Your weight.  Your blood glucose, blood pressure, and cholesterol levels.  Your activity level.  Other health conditions you have, such as heart or kidney disease. How do carbohydrates affect me? Carbohydrates, also called carbs, affect your blood glucose level more than any other type of food. Eating carbs naturally raises the amount of glucose in your blood. Carb counting is a method for keeping track of how many carbs you eat. Counting carbs is important to keep your blood glucose at a healthy level, especially if you use insulin or take certain oral diabetes medicines. It is important to know how many carbs you can safely have in each meal. This is different for every person. Your dietitian can help you calculate how many carbs you should have at each meal and for each snack. Foods that contain carbs include:  Bread, cereal, rice, pasta, and crackers.  Potatoes and corn.  Peas, beans, and lentils.  Milk and yogurt.  Fruit and juice.  Desserts, such as cakes, cookies, ice cream, and candy. How does alcohol affect me? Alcohol can cause a sudden decrease in blood glucose (hypoglycemia),  especially if you use insulin or take certain oral diabetes medicines. Hypoglycemia can be a life-threatening condition. Symptoms of hypoglycemia (sleepiness, dizziness, and confusion) are similar to symptoms of having too much alcohol. If your health care provider says that alcohol is safe for you, follow these guidelines:  Limit alcohol intake to no more than 1 drink per day for nonpregnant women and 2 drinks per day for men. One drink equals 12 oz of beer, 5 oz of wine, or 1 oz of hard liquor.  Do not drink on an empty stomach.  Keep yourself hydrated with water, diet soda, or unsweetened iced tea.  Keep in mind that regular soda, juice, and other mixers may contain a lot of sugar and must be counted as carbs. What are tips for following this plan?  Reading food labels  Start by checking the serving size on the "Nutrition Facts" label of packaged foods and drinks. The amount of calories, carbs, fats, and other nutrients listed on the label is based on one serving of the item. Many items contain more than one serving per package.  Check the total grams (g) of carbs in one serving. You can calculate the number of servings of carbs in one serving by dividing the total carbs by 15. For example, if a food has 30 g of total carbs, it would be equal to 2 servings of carbs.  Check the number of grams (g) of saturated and trans fats in one serving. Choose foods that have low or no amount of these fats.  Check the number of   milligrams (mg) of salt (sodium) in one serving. Most people should limit total sodium intake to less than 2,300 mg per day.  Always check the nutrition information of foods labeled as "low-fat" or "nonfat". These foods may be higher in added sugar or refined carbs and should be avoided.  Talk to your dietitian to identify your daily goals for nutrients listed on the label. Shopping  Avoid buying canned, premade, or processed foods. These foods tend to be high in fat, sodium,  and added sugar.  Shop around the outside edge of the grocery store. This includes fresh fruits and vegetables, bulk grains, fresh meats, and fresh dairy. Cooking  Use low-heat cooking methods, such as baking, instead of high-heat cooking methods like deep frying.  Cook using healthy oils, such as olive, canola, or sunflower oil.  Avoid cooking with butter, cream, or high-fat meats. Meal planning  Eat meals and snacks regularly, preferably at the same times every day. Avoid going long periods of time without eating.  Eat foods high in fiber, such as fresh fruits, vegetables, beans, and whole grains. Talk to your dietitian about how many servings of carbs you can eat at each meal.  Eat 4-6 ounces (oz) of lean protein each day, such as lean meat, chicken, fish, eggs, or tofu. One oz of lean protein is equal to: ? 1 oz of meat, chicken, or fish. ? 1 egg. ?  cup of tofu.  Eat some foods each day that contain healthy fats, such as avocado, nuts, seeds, and fish. Lifestyle  Check your blood glucose regularly.  Exercise regularly as told by your health care provider. This may include: ? 150 minutes of moderate-intensity or vigorous-intensity exercise each week. This could be brisk walking, biking, or water aerobics. ? Stretching and doing strength exercises, such as yoga or weightlifting, at least 2 times a week.  Take medicines as told by your health care provider.  Do not use any products that contain nicotine or tobacco, such as cigarettes and e-cigarettes. If you need help quitting, ask your health care provider.  Work with a Social worker or diabetes educator to identify strategies to manage stress and any emotional and social challenges. Questions to ask a health care provider  Do I need to meet with a diabetes educator?  Do I need to meet with a dietitian?  What number can I call if I have questions?  When are the best times to check my blood glucose? Where to find more  information:  American Diabetes Association: diabetes.org  Academy of Nutrition and Dietetics: www.eatright.[employer] of Diabetes and Digestive and Kidney Diseases (NIH): [employer] Summary  A healthy meal plan will help you control your blood glucose and maintain a healthy lifestyle.  Working with a diet and nutrition specialist (dietitian) can help you make a meal plan that is best for you.  Keep in mind that carbohydrates (carbs) and alcohol have immediate effects on your blood glucose levels. It is important to count carbs and to use alcohol carefully. This information is not intended to replace advice given to you by your health care provider. Make sure you discuss any questions you have with your health care provider. Document Released: 06/05/2005 Document Revised: 08/21/2017 Document Reviewed: 10/13/2016 Elsevier Patient Education  [place]. Cough, Adult A cough helps to clear your throat and lungs. A cough may be a sign of an illness or another medical condition. An acute cough may only last 2-3 weeks, while a chronic cough  may last 8 or more weeks. Many things can cause a cough. They include:  Germs (viruses or bacteria) that attack the airway.  Breathing in things that bother (irritate) your lungs.  Allergies.  Asthma.  Mucus that runs down the back of your throat (postnasal drip).  Smoking.  Acid backing up from the stomach into the tube that moves food from the mouth to the stomach (gastroesophageal reflux).  Some medicines.  Lung problems.  Other medical conditions, such as heart failure or a blood clot in the lung (pulmonary embolism). Follow these instructions at home: Medicines  Take over-the-counter and prescription medicines only as told by your doctor.  Talk with your doctor before you take medicines that stop a cough (coughsuppressants). Lifestyle   Do not smoke, and try not to be around smoke. Do not use any  products that contain nicotine or tobacco, such as cigarettes, e-cigarettes, and chewing tobacco. If you need help quitting, ask your doctor.  Drink enough fluid to keep your pee (urine) pale yellow.  Avoid caffeine.  Do not drink alcohol if your doctor tells you not to drink. General instructions   Watch for any changes in your cough. Tell your doctor about them.  Always cover your mouth when you cough.  Stay away from things that make you cough, such as perfume, candles, campfire smoke, or cleaning products.  If the air is dry, use a cool mist vaporizer or humidifier in your home.  If your cough is worse at night, try using extra pillows to raise your head up higher while you sleep.  Rest as needed.  Keep all follow-up visits as told by your doctor. This is important. Contact a doctor if:  You have new symptoms.  You cough up pus.  Your cough does not get better after 2-3 weeks, or your cough gets worse.  Cough medicine does not help your cough and you are not sleeping well.  You have pain that gets worse or pain that is not helped with medicine.  You have a fever.  You are losing weight and you do not know why.  You have night sweats. Get help right away if:  You cough up blood.  You have trouble breathing.  Your heartbeat is very fast. These symptoms may be an emergency. Do not wait to see if the symptoms will go away. Get medical help right away. Call your local emergency services (911 in the [place] Do not drive yourself to the hospital. Summary  A cough helps to clear your throat and lungs. Many things can cause a cough.  Take over-the-counter and prescription medicines only as told by your doctor.  Always cover your mouth when you cough.  Contact a doctor if you have new symptoms or you have a cough that does not get better or gets worse. This information is not intended to replace advice given to you by your health care provider. Make sure you discuss  any questions you have with your health care provider. Document Released: 05/22/2011 Document Revised: 09/27/2018 Document Reviewed: 09/27/2018 Elsevier Patient Education  [place].

## 2019-05-18 DIAGNOSIS — Z961 Presence of intraocular lens: Secondary | ICD-10-CM | POA: Insufficient documentation

## 2019-05-18 LAB — URINE CULTURE

## 2019-06-01 ENCOUNTER — Encounter (HOSPITAL_COMMUNITY): Payer: Self-pay

## 2019-06-01 ENCOUNTER — Encounter (HOSPITAL_COMMUNITY): Payer: Self-pay | Admitting: *Deleted

## 2019-06-10 ENCOUNTER — Other Ambulatory Visit: Payer: Self-pay | Admitting: Family Medicine

## 2019-06-10 DIAGNOSIS — Z1231 Encounter for screening mammogram for malignant neoplasm of breast: Secondary | ICD-10-CM

## 2019-06-21 ENCOUNTER — Other Ambulatory Visit: Payer: Self-pay | Admitting: Family Medicine

## 2019-06-21 DIAGNOSIS — E088 Diabetes mellitus due to underlying condition with unspecified complications: Secondary | ICD-10-CM

## 2019-06-22 ENCOUNTER — Other Ambulatory Visit: Payer: Self-pay

## 2019-06-22 ENCOUNTER — Ambulatory Visit: Payer: Medicaid Other | Admitting: Podiatry

## 2019-06-22 ENCOUNTER — Encounter: Payer: Self-pay | Admitting: Podiatry

## 2019-06-22 DIAGNOSIS — M79675 Pain in left toe(s): Secondary | ICD-10-CM | POA: Diagnosis not present

## 2019-06-22 DIAGNOSIS — B351 Tinea unguium: Secondary | ICD-10-CM | POA: Diagnosis not present

## 2019-06-22 DIAGNOSIS — G5762 Lesion of plantar nerve, left lower limb: Secondary | ICD-10-CM

## 2019-06-22 DIAGNOSIS — M79674 Pain in right toe(s): Secondary | ICD-10-CM

## 2019-06-22 DIAGNOSIS — E1142 Type 2 diabetes mellitus with diabetic polyneuropathy: Secondary | ICD-10-CM

## 2019-06-22 DIAGNOSIS — G5793 Unspecified mononeuropathy of bilateral lower limbs: Secondary | ICD-10-CM

## 2019-06-22 MED ORDER — NONFORMULARY OR COMPOUNDED ITEM: 3 refills | Status: DC

## 2019-06-22 NOTE — Patient Instructions (Addendum)
Diabetic Neuropathy Diabetic neuropathy refers to nerve damage that is caused by diabetes (diabetes mellitus). Over time, people with diabetes can develop nerve damage throughout the body. There are several types of diabetic neuropathy:  Peripheral neuropathy. This is the most common type of diabetic neuropathy. It causes damage to nerves that carry signals between the spinal cord and other parts of the body (peripheral nerves). This usually affects nerves in the feet and legs first, and may eventually affect the hands and arms. The damage affects the ability to sense touch or temperature.  Autonomic neuropathy. This type causes damage to nerves that control involuntary functions (autonomic nerves). These nerves carry signals that control: ? Heartbeat. ? Body temperature. ? Blood pressure. ? Urination. ? Digestion. ? Sweating. ? Sexual function. ? Response to changing blood sugar (glucose) levels.  Focal neuropathy. This type of nerve damage affects one area of the body, such as an arm, a leg, or the face. The injury may involve one nerve or a small group of nerves. Focal neuropathy can be painful and unpredictable, and occurs most often in older adults with diabetes. This often develops suddenly, but usually improves over time and does not cause long-term problems.  Proximal neuropathy. This type of nerve damage affects the nerves of the thighs, hips, buttocks, or legs. It causes severe pain, weakness, and muscle death (atrophy), usually in the thigh muscles. It is more common among older men and people who have type 2 diabetes. The length of recovery time may vary. What are the causes? Peripheral, autonomic, and focal neuropathies are caused by diabetes that is not well controlled with treatment. The cause of proximal neuropathy is not known, but it may be caused by inflammation related to uncontrolled blood glucose levels. What are the signs or symptoms? Peripheral neuropathy Peripheral  neuropathy develops slowly over time. When the nerves of the feet and legs no longer work, you may experience:  Burning, stabbing, or aching pain in the legs or feet.  Pain or cramping in the legs or feet.  Loss of feeling (numbness) and inability to feel pressure or pain in the feet. This can lead to: ? Thick calluses or sores on areas of constant pressure. ? Ulcers. ? Reduced ability to feel temperature changes.  Foot deformities.  Muscle weakness.  Loss of balance or coordination. Autonomic neuropathy The symptoms of autonomic neuropathy vary depending on which nerves are affected. Symptoms may include:  Problems with digestion, such as: ? Nausea or vomiting. ? Poor appetite. ? Bloating. ? Diarrhea or constipation. ? Trouble swallowing. ? Losing weight without trying to.  Problems with the heart, blood and lungs, such as: ? Dizziness, especially when standing up. ? Fainting. ? Shortness of breath. ? Irregular heartbeat.  Bladder problems, such as: ? Trouble starting or stopping urination. ? Leaking urine. ? Trouble emptying the bladder. ? Urinary tract infections (UTIs).  Problems with other body functions, such as: ? Sweat. You may sweat too much or too little. ? Temperature. You might get hot easily. Or, you might feel cold more than usual. ? Sexual function. Men may not be able to get or maintain an erection. Women may have vaginal dryness and difficulty with arousal. Focal neuropathy Symptoms affect only one area of the body. Common symptoms include:  Numbness.  Tingling.  Burning pain.  Prickling feeling.  Very sensitive skin.  Weakness.  Inability to move (paralysis).  Muscle twitching.  Muscles getting smaller (wasting).  Poor coordination.  Double or blurred vision. Proximal   neuropathy  Sudden, severe pain in the hip, thigh, or buttocks. Pain may spread from the back into the legs (sciatica).  Pain and numbness in the arms and legs.   Tingling.  Loss of bladder control or bowel control.  Weakness and wasting of thigh muscles.  Difficulty getting up from a seated position.  Abdominal swelling.  Unexplained weight loss. How is this diagnosed? Diagnosis usually involves reviewing your medical history and any symptoms you have. Diagnosis varies depending on the type of neuropathy your health care provider suspects. Peripheral neuropathy Your health care provider will check areas that are affected by your nervous system (neurologic exam), such as your reflexes, how you move, and what you can feel. You may have other tests, such as:  Blood tests.  Removal and examination of fluid that surrounds the spinal cord (lumbar puncture).  CT scan.  MRI.  A test to check the nerves that control muscles (electromyogram, EMG).  Tests of how quickly messages pass through your nerves (nerve conduction velocity tests).  Removal of a small piece of nerve to be examined under a microscope (biopsy). Autonomic neuropathy You may have tests, such as:  Tests to measure your blood pressure and heart rate. This may include monitoring you while you are safely secured to an exam table that moves you from a lying position to an upright position (table tilt test).  Breathing tests to check your lungs.  Tests to check how food moves through the digestive system (gastric emptying tests).  Blood, sweat, or urine tests.  Ultrasound of your bladder.  Spinal fluid tests. Focal neuropathy This condition may be diagnosed with:  A neurologic exam.  CT scan.  MRI.  EMG.  Nerve conduction velocity tests. Proximal neuropathy There is no test to diagnose this type of neuropathy. You may have tests to rule out other possible causes of this type of neuropathy. Tests may include:  X-rays of your spine and lumbar region.  Lumbar puncture.  MRI. How is this treated? The goal of treatment is to keep nerve damage from getting worse.  The most important part of treatment is keeping your blood glucose level and your A1C level within your target range by following your diabetes management plan. Over time, maintaining lower blood glucose levels helps lessen symptoms. In some cases, you may need prescription pain medicine. Follow these instructions at home:  Lifestyle   Do not use any products that contain nicotine or tobacco, such as cigarettes and e-cigarettes. If you need help quitting, ask your health care provider.  Be physically active every day. Include strength training and balance exercises.  Follow a healthy meal plan.  Work with your health care provider to manage your blood pressure. General instructions  Follow your diabetes management plan as directed. ? Check your blood glucose levels as directed by your health care provider. ? Keep your blood glucose in your target range as directed by your health care provider. ? Have your A1C level checked at least two times a year, or as often as told by your health care provider.  Take over the counter and prescription medicines only as told by your health care provider. This includes insulin and diabetes medicine.  Do not drive or use heavy machinery while taking prescription pain medicines.  Check your skin and feet every day for cuts, bruises, redness, blisters, or sores.  Keep all follow up visits as told by your health care provider. This is important. Contact a health care provider if:    You have burning, stabbing, or aching pain in your legs or feet.  You are unable to feel pressure or pain in your feet.  You develop problems with digestion, such as: ? Nausea. ? Vomiting. ? Bloating. ? Constipation. ? Diarrhea. ? Abdominal pain.  You have difficulty with urination, such as inability: ? To control when you urinate (incontinence). ? To completely empty the bladder (retention).  You have palpitations.  You feel dizzy, weak, or faint when you stand  up. Get help right away if:  You cannot urinate.  You have sudden weakness or loss of coordination.  You have trouble speaking.  You have pain or pressure in your chest.  You have an irregular heart beat.  You have sudden inability to move a part of your body. Summary  Diabetic neuropathy refers to nerve damage that is caused by diabetes. It can affect nerves throughout the entire body, causing numbness and pain in the arms, legs, digestive tract, heart, and other body systems.  Keep your blood glucose level and your blood pressure in your target range, as directed by your health care provider. This can help prevent neuropathy from getting worse.  Check your skin and feet every day for cuts, bruises, redness, blisters, or sores.  Do not use any products that contain nicotine or tobacco, such as cigarettes and e-cigarettes. If you need help quitting, ask your health care provider. This information is not intended to replace advice given to you by your health care provider. Make sure you discuss any questions you have with your health care provider. Document Released: 11/17/2001 Document Revised: 10/21/2017 Document Reviewed: 10/13/2016 Elsevier Patient Education  2020 [employer]  Onychomycosis/Fungal Toenails  WHAT IS IT? An infection that lies within the keratin of your nail plate that is caused by a fungus.  WHY ME? Fungal infections affect all ages, sexes, races, and creeds.  There may be many factors that predispose you to a fungal infection such as age, coexisting medical conditions such as diabetes, or an autoimmune disease; stress, medications, fatigue, genetics, etc.  Bottom line: fungus thrives in a warm, moist environment and your shoes offer such a location.  IS IT CONTAGIOUS? Theoretically, yes.  You do not want to share shoes, nail clippers or files with someone who has fungal toenails.  Walking around barefoot in the same room or sleeping in the same bed is unlikely  to transfer the organism.  It is important to realize, however, that fungus can spread easily from one nail to the next on the same foot.  HOW DO WE TREAT THIS?  There are several ways to treat this condition.  Treatment may depend on many factors such as age, medications, pregnancy, liver and kidney conditions, etc.  It is best to ask your doctor which options are available to you.  1. No treatment.   Unlike many other medical concerns, you can live with this condition.  However for many people this can be a painful condition and may lead to ingrown toenails or a bacterial infection.  It is recommended that you keep the nails cut short to help reduce the amount of fungal nail. 2. Topical treatment.  These range from herbal remedies to prescription strength nail lacquers.  About 40-50% effective, topicals require twice daily application for approximately 9 to 12 months or until an entirely new nail has grown out.  The most effective topicals are medical grade medications available through physicians offices. 3. Oral antifungal medications.  With an 80-90% cure   rate, the most common oral medication requires 3 to 4 months of therapy and stays in your system for a year as the new nail grows out.  Oral antifungal medications do require blood work to make sure it is a safe drug for you.  A liver function panel will be performed prior to starting the medication and after the first month of treatment.  It is important to have the blood work performed to avoid any harmful side effects.  In general, this medication safe but blood work is required. 4. Laser Therapy.  This treatment is performed by applying a specialized laser to the affected nail plate.  This therapy is noninvasive, fast, and non-painful.  It is not covered by insurance and is therefore, out of pocket.  The results have been very good with a 80-95% cure rate.  [facility] is the only practice in the area to offer this therapy. 5. Permanent  Nail Avulsion.  Removing the entire nail so that a new nail will not grow back. 

## 2019-06-23 NOTE — Progress Notes (Signed)
Subjective: Bailey Walker presents today with history of neuropathy. Patient seen for preventative diabetic foot care follow up of chronic, painful mycotic toenails which interfere with daily activities and routine tasks.  Pain is aggravated when wearing enclosed shoe gear. Pain is getting progressively worse and relieved with periodic professional debridement.   Patient's granddaughter is present during the visit and serves as our interpreter on today.  Bailey Walker c/o burning pain of both feet which mostly occurs at night. It has been happening more frequently. She has tried nothing to treat it.   [name], FNP is his PCP.   Current Outpatient Medications on File Prior to Visit  Medication Sig Dispense Refill  . albuterol (VENTOLIN HFA) 108 (90 Base) MCG/ACT inhaler Inhale 2 puffs into the lungs every 6 (six) hours as needed for wheezing or shortness of breath. 6.7 g 2  . Blood Pressure Monitoring (BLOOD PRESSURE MONITOR/M CUFF) MISC 1 each by Does not apply route 2 (two) times daily. 1 each 0  . DEXILANT 30 MG capsule TAKE 1 CAPSULE BY MOUTH EVERY DAY 90 capsule 1  . Dexlansoprazole (DEXILANT) 30 MG capsule Take 1 capsule (30 mg total) by mouth daily. 30 capsule 0  . glipiZIDE (GLUCOTROL) 5 MG tablet TAKE 1 TABLET BY MOUTH TWICE A DAY BEFORE MEALS 60 tablet 3  . HYDROcodone-homatropine (HYCODAN) 5-1.5 MG/5ML syrup Take 5 mLs by mouth every 6 (six) hours as needed for cough. 75 mL 0  . levothyroxine (SYNTHROID) 75 MCG tablet TAKE 1 TABLET BY MOUTH EVERY DAY 90 tablet 0  . meclizine (ANTIVERT) 25 MG tablet Take 1 tablet (25 mg total) by mouth 3 (three) times daily as needed for dizziness. 30 tablet 0  . metFORMIN (GLUCOPHAGE) 500 MG tablet Take 0.5 tablets (250 mg total) by mouth 2 (two) times daily with a meal. 90 tablet 3  . metoprolol tartrate (LOPRESSOR) 25 MG tablet Take 1 tablet (25 mg total) by mouth 2 (two) times daily. 180 tablet 3  . nitroGLYCERIN (NITROSTAT) 0.4 MG SL tablet Place  1 tablet (0.4 mg total) under the tongue every 5 (five) minutes as needed for chest pain. 11 tablet 6  . prednisoLONE acetate (PRED FORTE) 1 % ophthalmic suspension Apply to eye.     No current facility-administered medications on file prior to visit.     Allergies  Allergen Reactions  . Ampicillin Other (See Comments)    Per allergy test  Objective:  Vascular Examination: Capillary refill time <3 seconds x 10 digit.   Dorsalis pedis pulses present b/l.  Posterior tibial pulses present b/l.  Sparse digital hair x 10 digits.  Skin temperature WNL b/l.  Dermatological Examination: Skin with normal turgor, texture and tone b/l.  Toenails 1-5 b/l discolored, thick, dystrophic with subungual debris and pain with palpation to nailbeds due to thickness of nails.  Musculoskeletal: Muscle strength 5/5 to all LE muscle groups b/l.  Neurological: Sensation intact b/l with 10 gram monofilament.  Vibratory sensation intact b/l.  Assessment: 1. 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 Neuropathic pain   Plan: 1. Toenails 1-5 b/l were debrided in length and girth without iatrogenic bleeding. 2. Discussed neuropathic pain and treatment options available. A prescription was sent to [facility] for peripheral neuropathy cream which consists of: Bupivacaine 1%, doxepin 3%, gabapentin 6%, pentoxifylline 3%, and Topimarate 1%. Apply 1-2 grams to feet twice daily. She is to apply once after dinner time and once before bedtime. 3. Patient to continue soft, supportive shoe gear daily.  4. Patient to report any pedal injuries to medical professional immediately. 5. Follow up 3 months.  6. Patient/POA to call should there be a concern in the interim.

## 2019-06-29 ENCOUNTER — Other Ambulatory Visit: Payer: Self-pay | Admitting: Family Medicine

## 2019-06-29 DIAGNOSIS — N6320 Unspecified lump in the left breast, unspecified quadrant: Secondary | ICD-10-CM

## 2019-07-05 ENCOUNTER — Other Ambulatory Visit: Payer: Self-pay | Admitting: Family Medicine

## 2019-07-05 ENCOUNTER — Telehealth: Payer: Self-pay | Admitting: *Deleted

## 2019-07-05 DIAGNOSIS — N6320 Unspecified lump in the left breast, unspecified quadrant: Secondary | ICD-10-CM

## 2019-07-05 NOTE — Telephone Encounter (Signed)
I informed pt's niece, [name] of [place] [phone].

## 2019-07-05 NOTE — Telephone Encounter (Signed)
Pt's niece, [name] called for the contact information of the cream pharmacy.

## 2019-07-06 ENCOUNTER — Ambulatory Visit
Admission: RE | Admit: 2019-07-06 | Discharge: 2019-07-06 | Disposition: A | Discharge status: Home / Self Care | Payer: Medicaid Other | Source: Ambulatory Visit | Attending: Family Medicine | Admitting: Family Medicine

## 2019-07-06 ENCOUNTER — Other Ambulatory Visit: Payer: Self-pay | Admitting: Family Medicine

## 2019-07-06 ENCOUNTER — Other Ambulatory Visit: Payer: Self-pay

## 2019-07-06 ENCOUNTER — Ambulatory Visit: Payer: Medicaid Other

## 2019-07-06 DIAGNOSIS — N6320 Unspecified lump in the left breast, unspecified quadrant: Secondary | ICD-10-CM

## 2019-07-06 DIAGNOSIS — N63 Unspecified lump in unspecified breast: Secondary | ICD-10-CM

## 2019-07-12 ENCOUNTER — Encounter: Payer: Self-pay | Admitting: Cardiovascular Disease

## 2019-07-12 ENCOUNTER — Telehealth: Payer: Self-pay

## 2019-07-12 ENCOUNTER — Other Ambulatory Visit: Payer: Self-pay

## 2019-07-12 ENCOUNTER — Ambulatory Visit: Payer: Medicaid Other | Admitting: Cardiovascular Disease

## 2019-07-12 VITALS — BP 124/76 | HR 76 | Temp 98.1°F | Ht 62.0 in | Wt 174.0 lb

## 2019-07-12 DIAGNOSIS — R0789 Other chest pain: Secondary | ICD-10-CM

## 2019-07-12 DIAGNOSIS — Z87891 Personal history of nicotine dependence: Secondary | ICD-10-CM | POA: Diagnosis not present

## 2019-07-12 DIAGNOSIS — R079 Chest pain, unspecified: Secondary | ICD-10-CM | POA: Diagnosis not present

## 2019-07-12 DIAGNOSIS — I10 Essential (primary) hypertension: Secondary | ICD-10-CM | POA: Diagnosis not present

## 2019-07-12 LAB — CBC
Hematocrit: 27.7 % — ABNORMAL LOW (ref 34.0–46.6)
Hemoglobin: 8.4 g/dL — ABNORMAL LOW (ref 11.1–15.9)
MCH: 21 pg — ABNORMAL LOW (ref 26.6–33.0)
MCHC: 30.3 g/dL — ABNORMAL LOW (ref 31.5–35.7)
MCV: 69 fL — ABNORMAL LOW (ref 79–97)
Platelets: 326 10*3/uL (ref 150–450)
RBC: 4 x10E6/uL (ref 3.77–5.28)
RDW: 16.1 % — ABNORMAL HIGH (ref 11.7–15.4)
WBC: 9 10*3/uL (ref 3.4–10.8)

## 2019-07-12 LAB — BASIC METABOLIC PANEL
BUN/Creatinine Ratio: 21 (ref 12–28)
BUN: 15 mg/dL (ref 8–27)
CO2: 21 mmol/L (ref 20–29)
Calcium: 8.8 mg/dL (ref 8.7–10.3)
Chloride: 104 mmol/L (ref 96–106)
Creatinine, Ser: 0.73 mg/dL (ref 0.57–1.00)
GFR calc Af Amer: 93 mL/min/{1.73_m2} (ref >59)
GFR calc non Af Amer: 81 mL/min/{1.73_m2} (ref >59)
Glucose: 129 mg/dL — ABNORMAL HIGH (ref 65–99)
Potassium: 4.9 mmol/L (ref 3.5–5.2)
Sodium: 139 mmol/L (ref 134–144)

## 2019-07-12 NOTE — Assessment & Plan Note (Signed)
Ms. Tak was referred to me by Dr. Claudie Leach for cardiac catheterization because of atypical chest pain.  She does have risk factors included treated diabetes and hypertension.  Both her brother and sister had documented CAD.  She is never had a heart attack or stroke.  She stopped smoking in 2013 after greater than 100 pack years of tobacco abuse and since that time has had daily atypical chest pressure.  Recent 2D echo performed by Dr. Annie Main showed normal LV function.  She was referred for cardiac catheterization.  I think because of the atypical nature of her symptoms I prefer to initially perform coronary CTA to define her anatomy.

## 2019-07-12 NOTE — Telephone Encounter (Signed)
Contacted Dr. Kyung Rudd Rosario's office to request pt records. Primary nurse wasTransferred to medical records by staff. [email or web address]

## 2019-07-12 NOTE — Progress Notes (Signed)
07/12/2019 Bailey Walker   [birth year]  [id number]  Primary Physician [name], MD Primary Cardiologist: Lorretta Harp MD FACP, [place], [place], [place]  HPI:  Bailey Walker is a 73 y.o. moderately overweight single Caucasian female originally from [place] who is accompanied by her niece [name].  She was referred by Dr. Annie Main for cardiac catheterization because of chest pain.  Her risk factors include 100 pack years of tobacco abuse having quit back in 2013.  She has treated diabetes and hypertension as well as family history with both brother and sister who have known CAD.  She has never had a heart attack or stroke.  Since stopping smoking in 2013 she has had daily chest pressure lasting all day.  Recent 2D echo was normal.   Current Meds  Medication Sig  . albuterol (VENTOLIN HFA) 108 (90 Base) MCG/ACT inhaler Inhale 2 puffs into the lungs every 6 (six) hours as needed for wheezing or shortness of breath.  . Blood Pressure Monitoring (BLOOD PRESSURE MONITOR/M CUFF) MISC 1 each by Does not apply route 2 (two) times daily.  Marland Kitchen DEXILANT 30 MG capsule TAKE 1 CAPSULE BY MOUTH EVERY DAY  . Dexlansoprazole (DEXILANT) 30 MG capsule Take 1 capsule (30 mg total) by mouth daily.  Marland Kitchen glipiZIDE (GLUCOTROL) 5 MG tablet TAKE 1 TABLET BY MOUTH TWICE A DAY BEFORE MEALS  . HYDROcodone-homatropine (HYCODAN) 5-1.5 MG/5ML syrup Take 5 mLs by mouth every 6 (six) hours as needed for cough.  . levothyroxine (SYNTHROID) 75 MCG tablet TAKE 1 TABLET BY MOUTH EVERY DAY  . meclizine (ANTIVERT) 25 MG tablet Take 1 tablet (25 mg total) by mouth 3 (three) times daily as needed for dizziness.  . metFORMIN (GLUCOPHAGE) 500 MG tablet Take 0.5 tablets (250 mg total) by mouth 2 (two) times daily with a meal.  . metoprolol tartrate (LOPRESSOR) 25 MG tablet Take 1 tablet (25 mg total) by mouth 2 (two) times daily.  . nitroGLYCERIN (NITROSTAT) 0.4 MG SL tablet Place 1 tablet (0.4 mg total) under the tongue every 5 (five)  minutes as needed for chest pain.  . NONFORMULARY OR COMPOUNDED ITEM Peripheral Neuropathy Cream: Bupivacaine 1%, Doxepin 3%, Gabapentin 6%, Pentoxifylline 3%, Topiramate 1% Order fax[facility] to [facility]  . prednisoLONE acetate (PRED FORTE) 1 % ophthalmic suspension Apply to eye.     Allergies  Allergen Reactions  . Ampicillin Other (See Comments)    Per allergy test    Social History   Socioeconomic History  . Marital status: Single    Spouse name: Not on file  . Number of children: 0  . Years of education: Not on file  . Highest education level: Not on file  Occupational History  . Not on file  Social Needs  . Financial resource strain: Not on file  . Food insecurity    Worry: Not on file    Inability: Not on file  . Transportation needs    Medical: Not on file    Non-medical: Not on file  Tobacco Use  . Smoking status: Former Smoker    Quit date: 12/29/2013    Years since quitting: 5.5  . Smokeless tobacco: Never Used  Substance and Sexual Activity  . Alcohol use: No  . Drug use: No  . Sexual activity: Not on file  Lifestyle  . Physical activity    Days per week: Not on file    Minutes per session: Not on file  . Stress: Not on file  Relationships  .  Social Herbalist on phone: Not on file    Gets together: Not on file    Attends religious service: Not on file    Active member of club or organization: Not on file    Attends meetings of clubs or organizations: Not on file    Relationship status: Not on file  . Intimate partner violence    Fear of current or ex partner: Not on file    Emotionally abused: Not on file    Physically abused: Not on file    Forced sexual activity: Not on file  Other Topics Concern  . Not on file  Social History Narrative  . Not on file     Review of Systems: General: negative for chills, fever, night sweats or weight changes.  Cardiovascular: negative for chest pain, dyspnea on exertion, edema, orthopnea,  palpitations, paroxysmal nocturnal dyspnea or shortness of breath Dermatological: negative for rash Respiratory: negative for cough or wheezing Urologic: negative for hematuria Abdominal: negative for nausea, vomiting, diarrhea, bright red blood per rectum, melena, or hematemesis Neurologic: negative for visual changes, syncope, or dizziness All other systems reviewed and are otherwise negative except as noted above.    Blood pressure 124/76, pulse 76, temperature 98.1 F (36.7 C), height 5\' 2"  (1.575 m), weight 174 lb (78.9 kg).  General appearance: alert and no distress Neck: no adenopathy, no carotid bruit, no JVD, supple, symmetrical, trachea midline and thyroid not enlarged, symmetric, no tenderness/mass/nodules Lungs: clear to auscultation bilaterally Heart: regular rate and rhythm, S1, S2 normal, no murmur, click, rub or gallop Extremities: extremities normal, atraumatic, no cyanosis or edema Pulses: 2+ and symmetric Skin: Skin color, texture, turgor normal. No rashes or lesions Neurologic: Alert and oriented X 3, normal strength and tone. Normal symmetric reflexes. Normal coordination and gait  EKG not performed today  ASSESSMENT AND PLAN:   Atypical chest pain Ms. Vanderwerf was referred to me by Dr. Claudie Leach for cardiac catheterization because of atypical chest pain.  She does have risk factors included treated diabetes and hypertension.  Both her brother and sister had documented CAD.  She is never had a heart attack or stroke.  She stopped smoking in 2013 after greater than 100 pack years of tobacco abuse and since that time has had daily atypical chest pressure.  Recent 2D echo performed by Dr. Annie Main showed normal LV function.  She was referred for cardiac catheterization.  I think because of the atypical nature of her symptoms I prefer to initially perform coronary CTA to define her anatomy.  Essential hypertension History essentially potential blood pressure measured today  124/76.  She is on metoprolol.  Ex-smoker History of remote tobacco use having quit in 2013 after smoking greater than 100 pack years.      Lorretta Harp MD FACP,FACC,FAHA, [facility] 07/12/2019 10:34 AM

## 2019-07-12 NOTE — Assessment & Plan Note (Signed)
History essentially potential blood pressure measured today 124/76.  She is on metoprolol.

## 2019-07-12 NOTE — Assessment & Plan Note (Signed)
History of remote tobacco use having quit in 2013 after smoking greater than 100 pack years.

## 2019-07-12 NOTE — Patient Instructions (Addendum)
Your cardiac CT will be scheduled at one of the below locations:   [facility] [address] [place], [place] [address] (336) [place] [address] [place], [place] [address] [phone]  If scheduled at [facility], please arrive at the [facility] main entrance of [facility] 30-45 minutes prior to test start time. Proceed to the [facility] Radiology Department (first floor) to check-in and test prep.  If scheduled at [facility], please arrive 15 mins early for check-in and test prep.  Please follow these instructions carefully (unless otherwise directed):   On the Night Before the Test: . Be sure to Drink plenty of water. . Do not consume any caffeinated/decaffeinated beverages or chocolate 12 hours prior to your test. . Do not take any antihistamines 12 hours prior to your test.  On the Day of the Test: . Drink plenty of water. Do not drink any water within one hour of the test. . Do not eat any food 4 hours prior to the test. . You may take your regular medications prior to the test.  . Take metoprolol (Lopressor) 50 mg two hours prior to test. . FEMALES- please wear underwire-free bra if available       After the Test: . Drink plenty of water. . After receiving IV contrast, you may experience a mild flushed feeling. This is normal. . On occasion, you may experience a mild rash up to 24 hours after the test. This is not dangerous. If this occurs, you can take Benadryl 25 mg and increase your fluid intake. . If you experience trouble breathing, this can be serious. If it is severe call 911 IMMEDIATELY. If it is mild, please call our office. . If you take any of these medications: Glipizide/Metformin, Avandament, Glucavance, please do not take 48 hours after completing test unless otherwise instructed.    Please contact the cardiac imaging nurse  navigator should you have any questions/concerns Marchia Bond, RN Navigator Cardiac Imaging [facility] [phone] Office    ______________________________________________________________________  Follow-Up: At [facility], you and your health needs are our priority.  As part of our continuing mission to provide you with exceptional heart care, we have created designated Provider Care Teams.  These Care Teams include your primary Cardiologist (physician) and Advanced Practice Providers (APPs -  Physician Assistants and Nurse Practitioners) who all work together to provide you with the care you need, when you need it. . You will need a follow up appointment as needed. You will be contacted with the results of your cardiac CT. You may see Dr. Gwenlyn Found or one of the following Advanced Practice Providers on your designated Care Team:   . Kerin Ransom, PA-C . Daleen Snook Kroeger, PA-C . Sande Rives, PA-C ____________________ . Almyra Deforest, PA-C . Fabian Sharp, PA-C . Jory Sims, DNP . Rosaria Ferries, PA-C

## 2019-08-02 ENCOUNTER — Telehealth: Payer: Self-pay | Admitting: Internal Medicine

## 2019-08-02 DIAGNOSIS — R059 Cough, unspecified: Secondary | ICD-10-CM

## 2019-08-02 DIAGNOSIS — I10 Essential (primary) hypertension: Secondary | ICD-10-CM

## 2019-08-02 DIAGNOSIS — R05 Cough: Secondary | ICD-10-CM

## 2019-08-02 MED ORDER — ALBUTEROL SULFATE HFA 108 (90 BASE) MCG/ACT IN AERS: 2.0000 | INHALATION_SPRAY | Freq: Four times a day (QID) | RESPIRATORY_TRACT | 2 refills | Status: DC | PRN

## 2019-08-02 MED ORDER — METOPROLOL TARTRATE 25 MG PO TABS: 25.0000 mg | ORAL_TABLET | Freq: Two times a day (BID) | ORAL | 3 refills | Status: AC

## 2019-08-02 NOTE — Telephone Encounter (Signed)
Refills sent to pharmacy. Thanks.

## 2019-08-15 ENCOUNTER — Telehealth: Payer: Self-pay | Admitting: Internal Medicine

## 2019-08-15 NOTE — Telephone Encounter (Signed)
Call Pt has another Dr and will not be coming back to or office

## 2019-08-17 ENCOUNTER — Ambulatory Visit: Payer: Medicaid Other | Admitting: Family Medicine

## 2019-09-02 ENCOUNTER — Telehealth (HOSPITAL_COMMUNITY): Payer: Self-pay | Admitting: Emergency Medicine

## 2019-09-02 NOTE — Telephone Encounter (Signed)
Left message on voicemail with name and callback number Tressy Kunzman RN Navigator Cardiac Imaging [facility] [phone] Office [phone] Cell  

## 2019-09-05 ENCOUNTER — Ambulatory Visit (HOSPITAL_COMMUNITY)
Admission: RE | Admit: 2019-09-05 | Discharge: 2019-09-05 | Disposition: A | Discharge status: Home / Self Care | Payer: Medicaid Other | Source: Ambulatory Visit | Attending: Cardiovascular Disease | Admitting: Cardiovascular Disease

## 2019-09-14 ENCOUNTER — Other Ambulatory Visit: Payer: Self-pay | Admitting: Family Medicine

## 2019-09-14 DIAGNOSIS — E039 Hypothyroidism, unspecified: Secondary | ICD-10-CM

## 2019-09-24 ENCOUNTER — Other Ambulatory Visit: Payer: Self-pay | Admitting: Family Medicine

## 2019-09-24 DIAGNOSIS — E039 Hypothyroidism, unspecified: Secondary | ICD-10-CM

## 2019-09-27 ENCOUNTER — Telehealth (HOSPITAL_COMMUNITY): Payer: Self-pay | Admitting: Emergency Medicine

## 2019-09-27 NOTE — Telephone Encounter (Signed)
Calling in regards to patients upcoming appt for CCTA tomorrow 09/28/19; pt wishes to cancel until covid calms down  No other questions. Scheduling aware  Marchia Bond RN Navigator Cardiac Imaging [facility] Vascular Services [phone] Office  [phone] Cell

## 2019-09-28 ENCOUNTER — Ambulatory Visit (HOSPITAL_COMMUNITY)
Admission: RE | Admit: 2019-09-28 | Discharge: 2019-09-28 | Disposition: A | Discharge status: Home / Self Care | Payer: Medicaid Other | Source: Ambulatory Visit | Attending: Cardiovascular Disease | Admitting: Cardiovascular Disease

## 2019-09-28 ENCOUNTER — Encounter: Payer: Self-pay | Admitting: Podiatry

## 2019-09-28 ENCOUNTER — Ambulatory Visit: Payer: Medicaid Other | Admitting: Podiatry

## 2019-09-28 ENCOUNTER — Other Ambulatory Visit: Payer: Self-pay

## 2019-09-28 DIAGNOSIS — B351 Tinea unguium: Secondary | ICD-10-CM | POA: Diagnosis not present

## 2019-09-28 DIAGNOSIS — M79675 Pain in left toe(s): Secondary | ICD-10-CM | POA: Diagnosis not present

## 2019-09-28 DIAGNOSIS — M79674 Pain in right toe(s): Secondary | ICD-10-CM

## 2019-09-28 DIAGNOSIS — E1142 Type 2 diabetes mellitus with diabetic polyneuropathy: Secondary | ICD-10-CM

## 2019-09-28 NOTE — Patient Instructions (Signed)
Diabetes Mellitus and Foot Care Foot care is an important part of your health, especially when you have diabetes. Diabetes may cause you to have problems because of poor blood flow (circulation) to your feet and legs, which can cause your skin to:  Become thinner and drier.  Break more easily.  Heal more slowly.  Peel and crack. You may also have nerve damage (neuropathy) in your legs and feet, causing decreased feeling in them. This means that you may not notice minor injuries to your feet that could lead to more serious problems. Noticing and addressing any potential problems early is the best way to prevent future foot problems. How to care for your feet Foot hygiene  Wash your feet daily with warm water and mild soap. Do not use hot water. Then, pat your feet and the areas between your toes until they are completely dry. Do not soak your feet as this can dry your skin.  Trim your toenails straight across. Do not dig under them or around the cuticle. File the edges of your nails with an emery board or nail file.  Apply a moisturizing lotion or petroleum jelly to the skin on your feet and to dry, brittle toenails. Use lotion that does not contain alcohol and is unscented. Do not apply lotion between your toes. Shoes and socks  Wear clean socks or stockings every day. Make sure they are not too tight. Do not wear knee-high stockings since they may decrease blood flow to your legs.  Wear shoes that fit properly and have enough cushioning. Always look in your shoes before you put them on to be sure there are no objects inside.  To break in new shoes, wear them for just a few hours a day. This prevents injuries on your feet. Wounds, scrapes, corns, and calluses  Check your feet daily for blisters, cuts, bruises, sores, and redness. If you cannot see the bottom of your feet, use a mirror or ask someone for help.  Do not cut corns or calluses or try to remove them with medicine.  If you  find a minor scrape, cut, or break in the skin on your feet, keep it and the skin around it clean and dry. You may clean these areas with mild soap and water. Do not clean the area with peroxide, alcohol, or iodine.  If you have a wound, scrape, corn, or callus on your foot, look at it several times a day to make sure it is healing and not infected. Check for: ? Redness, swelling, or pain. ? Fluid or blood. ? Warmth. ? Pus or a bad smell. General instructions  Do not cross your legs. This may decrease blood flow to your feet.  Do not use heating pads or hot water bottles on your feet. They may burn your skin. If you have lost feeling in your feet or legs, you may not know this is happening until it is too late.  Protect your feet from hot and cold by wearing shoes, such as at the beach or on hot pavement.  Schedule a complete foot exam at least once a year (annually) or more often if you have foot problems. If you have foot problems, report any cuts, sores, or bruises to your health care provider immediately. Contact a health care provider if:  You have a medical condition that increases your risk of infection and you have any cuts, sores, or bruises on your feet.  You have an injury that is not   healing.  You have redness on your legs or feet.  You feel burning or tingling in your legs or feet.  You have pain or cramps in your legs and feet.  Your legs or feet are numb.  Your feet always feel cold.  You have pain around a toenail. Get help right away if:  You have a wound, scrape, corn, or callus on your foot and: ? You have pain, swelling, or redness that gets worse. ? You have fluid or blood coming from the wound, scrape, corn, or callus. ? Your wound, scrape, corn, or callus feels warm to the touch. ? You have pus or a bad smell coming from the wound, scrape, corn, or callus. ? You have a fever. ? You have a red line going up your leg. Summary  Check your feet every day  for cuts, sores, red spots, swelling, and blisters.  Moisturize feet and legs daily.  Wear shoes that fit properly and have enough cushioning.  If you have foot problems, report any cuts, sores, or bruises to your health care provider immediately.  Schedule a complete foot exam at least once a year (annually) or more often if you have foot problems. This information is not intended to replace advice given to you by your health care provider. Make sure you discuss any questions you have with your health care provider. Document Revised: 06/01/2019 Document Reviewed: 10/10/2016 Elsevier Patient Education  2020 Elsevier Inc.  

## 2019-09-29 NOTE — Telephone Encounter (Signed)
Thx.  Can you put her on a recall list to get back in touch with her in several months please?

## 2019-10-02 NOTE — Progress Notes (Addendum)
Subjective: Bailey Walker presents today for preventative diabetic foot care. She has h/o neuropathic pain. Patient is seen for follow up of painful, mycotic toenails which interfere with comfortable ambulation when wearing enclosed shoe gear. Pain is relieved with periodic professional debridement.  She is accompanied by her daughter on today's visit. Her daughter serves as our interpreter as Ms. Lozinski speaks Arabic.  [name], MD is patient's PCP.  Medications reviewed in chart.  Allergies  Allergen Reactions  . Ampicillin Other (See Comments)    Per allergy test    Objective: There were no vitals filed for this visit.  Vascular Examination: Capillary refill time to digits <3 seconds b/l.   Dorsalis pedis present b/l.  Posterior tibial pulses present b/l.  Digital hair sparse b/l.   Skin temperature gradient WNL b/l.   Dermatological Examination: Skin with normal turgor, texture and tone b/l.  No open wounds b/l.   Toenails 1-5 b/l discolored, thick, dystrophic with subungual debris and pain with palpation to nailbeds due to thickness of nails.  Musculoskeletal: Muscle strength 5/5 b/l to all LE muscle groups.  No gross bony deformities b/l.   No pain, crepitus or joint limitation with passive/active ROM b/l.  Neurological Examination: Protective sensation intact 5/5 b/l with 10 gram monofilament.  Vibratory sensation intact bilaterally.   Assessment: 1. 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 NIDDM with neuropathy  Plan: 1. Continue diabetic foot care principles. Literature dispensed on today. 2. Toenails 1-5 b/l were debrided in length and girth without iatrogenic bleeding. 3. Patient to continue soft, supportive shoe gear. 4. Patient to report any pedal injuries to medical professional. 5. Follow up 3 months.  6. Patient/POA to call should there be a concern in the interim.

## 2019-10-20 ENCOUNTER — Other Ambulatory Visit: Payer: Self-pay | Admitting: Family Medicine

## 2019-10-20 DIAGNOSIS — K219 Gastro-esophageal reflux disease without esophagitis: Secondary | ICD-10-CM

## 2019-11-04 ENCOUNTER — Other Ambulatory Visit: Payer: Self-pay | Admitting: Internal Medicine

## 2019-11-04 DIAGNOSIS — E088 Diabetes mellitus due to underlying condition with unspecified complications: Secondary | ICD-10-CM

## 2019-12-20 ENCOUNTER — Ambulatory Visit: Payer: Medicaid Other | Admitting: Family Medicine

## 2019-12-21 ENCOUNTER — Other Ambulatory Visit: Payer: Self-pay

## 2019-12-21 DIAGNOSIS — R079 Chest pain, unspecified: Secondary | ICD-10-CM

## 2019-12-26 ENCOUNTER — Telehealth (HOSPITAL_COMMUNITY): Payer: Self-pay | Admitting: Emergency Medicine

## 2019-12-26 NOTE — Telephone Encounter (Signed)
Okay to reschedule.

## 2019-12-26 NOTE — Telephone Encounter (Signed)
Pt states she wishes to cancel this test as she is out of town. Okay to r/s later. Marchia Bond RN Navigator Cardiac Imaging [facility] [phone] Office  [phone] Cell

## 2019-12-27 ENCOUNTER — Ambulatory Visit (HOSPITAL_COMMUNITY): Payer: Medicaid Other

## 2019-12-28 ENCOUNTER — Ambulatory Visit: Payer: Medicaid Other | Admitting: Podiatry

## 2019-12-30 ENCOUNTER — Other Ambulatory Visit: Payer: Self-pay | Admitting: Internal Medicine

## 2019-12-30 DIAGNOSIS — E039 Hypothyroidism, unspecified: Secondary | ICD-10-CM

## 2020-01-05 ENCOUNTER — Encounter (HOSPITAL_BASED_OUTPATIENT_CLINIC_OR_DEPARTMENT_OTHER): Payer: Self-pay | Admitting: *Deleted

## 2020-01-05 ENCOUNTER — Emergency Department (HOSPITAL_BASED_OUTPATIENT_CLINIC_OR_DEPARTMENT_OTHER)
Admission: EM | Admit: 2020-01-05 | Discharge: 2020-01-05 | Disposition: A | Discharge status: Home / Self Care | Payer: Medicaid Other | Attending: Emergency Medicine | Admitting: Emergency Medicine

## 2020-01-05 ENCOUNTER — Other Ambulatory Visit: Payer: Medicaid Other

## 2020-01-05 ENCOUNTER — Other Ambulatory Visit: Payer: Self-pay

## 2020-01-05 DIAGNOSIS — Z79899 Other long term (current) drug therapy: Secondary | ICD-10-CM | POA: Diagnosis not present

## 2020-01-05 DIAGNOSIS — Z87891 Personal history of nicotine dependence: Secondary | ICD-10-CM | POA: Insufficient documentation

## 2020-01-05 DIAGNOSIS — K529 Noninfective gastroenteritis and colitis, unspecified: Secondary | ICD-10-CM

## 2020-01-05 DIAGNOSIS — D729 Disorder of white blood cells, unspecified: Secondary | ICD-10-CM | POA: Insufficient documentation

## 2020-01-05 DIAGNOSIS — D509 Iron deficiency anemia, unspecified: Secondary | ICD-10-CM | POA: Insufficient documentation

## 2020-01-05 DIAGNOSIS — I10 Essential (primary) hypertension: Secondary | ICD-10-CM | POA: Insufficient documentation

## 2020-01-05 DIAGNOSIS — Z7984 Long term (current) use of oral hypoglycemic drugs: Secondary | ICD-10-CM | POA: Diagnosis not present

## 2020-01-05 DIAGNOSIS — R109 Unspecified abdominal pain: Secondary | ICD-10-CM | POA: Diagnosis present

## 2020-01-05 DIAGNOSIS — E119 Type 2 diabetes mellitus without complications: Secondary | ICD-10-CM | POA: Diagnosis not present

## 2020-01-05 LAB — CBC WITH DIFFERENTIAL/PLATELET
Abs Immature Granulocytes: 0.25 10*3/uL — ABNORMAL HIGH (ref 0.00–0.07)
Basophils Absolute: 0.1 10*3/uL (ref 0.0–0.1)
Basophils Relative: 1 %
Eosinophils Absolute: 0.1 10*3/uL (ref 0.0–0.5)
Eosinophils Relative: 1 %
HCT: 30.5 % — ABNORMAL LOW (ref 36.0–46.0)
Hemoglobin: 8.3 g/dL — ABNORMAL LOW (ref 12.0–15.0)
Immature Granulocytes: 1 %
Lymphocytes Relative: 11 %
Lymphs Abs: 2.9 10*3/uL (ref 0.7–4.0)
MCH: 18.4 pg — ABNORMAL LOW (ref 26.0–34.0)
MCHC: 27.2 g/dL — ABNORMAL LOW (ref 30.0–36.0)
MCV: 67.5 fL — ABNORMAL LOW (ref 80.0–100.0)
Monocytes Absolute: 1.6 10*3/uL — ABNORMAL HIGH (ref 0.1–1.0)
Monocytes Relative: 6 %
Neutro Abs: 21.4 10*3/uL — ABNORMAL HIGH (ref 1.7–7.7)
Neutrophils Relative %: 80 %
Platelets: 496 10*3/uL — ABNORMAL HIGH (ref 150–400)
RBC: 4.52 MIL/uL (ref 3.87–5.11)
RDW: 19.7 % — ABNORMAL HIGH (ref 11.5–15.5)
WBC: 26.3 10*3/uL — ABNORMAL HIGH (ref 4.0–10.5)
nRBC: 0 % (ref 0.0–0.2)

## 2020-01-05 LAB — COMPREHENSIVE METABOLIC PANEL
ALT: 12 U/L (ref 0–44)
AST: 18 U/L (ref 15–41)
Albumin: 3.9 g/dL (ref 3.5–5.0)
Alkaline Phosphatase: 84 U/L (ref 38–126)
Anion gap: 12 (ref 5–15)
BUN: 18 mg/dL (ref 8–23)
CO2: 20 mmol/L — ABNORMAL LOW (ref 22–32)
Calcium: 9.1 mg/dL (ref 8.9–10.3)
Chloride: 101 mmol/L (ref 98–111)
Creatinine, Ser: 0.98 mg/dL (ref 0.44–1.00)
GFR calc Af Amer: >60 mL/min (ref >60)
GFR calc non Af Amer: 56 mL/min — ABNORMAL LOW (ref >60)
Glucose, Bld: 241 mg/dL — ABNORMAL HIGH (ref 70–99)
Potassium: 3.6 mmol/L (ref 3.5–5.1)
Sodium: 133 mmol/L — ABNORMAL LOW (ref 135–145)
Total Bilirubin: 0.3 mg/dL (ref 0.3–1.2)
Total Protein: 8.1 g/dL (ref 6.5–8.1)

## 2020-01-05 LAB — IRON AND TIBC
Iron: 15 ug/dL — ABNORMAL LOW (ref 28–170)
Saturation Ratios: 3 % — ABNORMAL LOW (ref 10.4–31.8)
TIBC: 476 ug/dL — ABNORMAL HIGH (ref 250–450)
UIBC: 461 ug/dL

## 2020-01-05 LAB — VITAMIN B12: Vitamin B-12: 118 pg/mL — ABNORMAL LOW (ref 180–914)

## 2020-01-05 LAB — FOLATE: Folate: 30.4 ng/mL (ref >5.9)

## 2020-01-05 LAB — RETICULOCYTES
Immature Retic Fract: 33.8 % — ABNORMAL HIGH (ref 2.3–15.9)
RBC.: 4.54 MIL/uL (ref 3.87–5.11)
Retic Count, Absolute: 85.4 10*3/uL (ref 19.0–186.0)
Retic Ct Pct: 1.9 % (ref 0.4–3.1)

## 2020-01-05 LAB — FERRITIN: Ferritin: 4 ng/mL — ABNORMAL LOW (ref 11–307)

## 2020-01-05 MED ORDER — ONDANSETRON HCL 4 MG/2ML IJ SOLN
INTRAMUSCULAR | Status: AC
  Administered 2020-01-05: 4 mg via INTRAVENOUS
  Filled 2020-01-05: qty 2

## 2020-01-05 MED ORDER — DICYCLOMINE HCL 10 MG/ML IM SOLN
20.0000 mg | Freq: Once | INTRAMUSCULAR | Status: AC
  Administered 2020-01-05: 02:00:00 20 mg via INTRAMUSCULAR
  Filled 2020-01-05: qty 2

## 2020-01-05 MED ORDER — FENTANYL CITRATE (PF) 100 MCG/2ML IJ SOLN
25.0000 ug | Freq: Once | INTRAMUSCULAR | Status: AC
  Administered 2020-01-05: 25 ug via INTRAVENOUS
  Filled 2020-01-05: qty 2

## 2020-01-05 MED ORDER — SODIUM CHLORIDE 0.9 % IV BOLUS: 1000.0000 mL | Freq: Once | INTRAVENOUS | Status: DC

## 2020-01-05 MED ORDER — ONDANSETRON 8 MG PO TBDP: 8.0000 mg | ORAL_TABLET | Freq: Three times a day (TID) | ORAL | 0 refills | Status: DC | PRN

## 2020-01-05 MED ORDER — ONDANSETRON HCL 4 MG/2ML IJ SOLN: 4.0000 mg | Freq: Once | INTRAMUSCULAR | Status: AC

## 2020-01-05 MED ORDER — SODIUM CHLORIDE 0.9 % IV BOLUS
1000.0000 mL | Freq: Once | INTRAVENOUS | Status: AC
  Administered 2020-01-05: 1000 mL via INTRAVENOUS

## 2020-01-05 NOTE — ED Provider Notes (Signed)
[facility] Provider Note: [name], MD, FACEP  CSN: [id number] MRN: [id number] ARRIVAL: 01/05/20 at [place]: [place]  Abdominal Pain   HISTORY OF PRESENT ILLNESS  01/05/20 12:53 AM Bailey Walker is a 74 y.o. female who had the sudden onset of diarrhea about 2 hours ago.  This was followed by nausea and vomiting.  She has associated abdominal pain which is across her lower abdomen.  She describes this as both crampy and feeling like gas.  She rates it as a 4 out of 10.  The pain is worse with palpation or movement.  She has not had a fever.  She has had some vaguely described abdominal pain for the past week.  Past Medical History:  Diagnosis Date  . Diabetes mellitus without complication ([place])   . Hypertension   . SVT (supraventricular tachycardia) ([place])   . Thyroid disease     Past Surgical History:  Procedure Laterality Date  . APPENDECTOMY    . BREAST SURGERY    . EYE SURGERY Bilateral    april and march 2019 for cataracts.   . fatty gland     left wrist , right breast  . KNEE SURGERY     left knee    Family History  Problem Relation Age of Onset  . Breast cancer Cousin        mat and pat sides  . Colon cancer Neg Hx   . Esophageal cancer Neg Hx   . Rectal cancer Neg Hx     Social History   Tobacco Use  . Smoking status: Former Smoker    Quit date: 12/29/2013    Years since quitting: 6.0  . Smokeless tobacco: Never Used  Substance Use Topics  . Alcohol use: No  . Drug use: No    Prior to Admission medications   Medication Sig Start Date End Date Taking? Authorizing Provider  albuterol (VENTOLIN HFA) 108 (90 Base) MCG/ACT inhaler Inhale 2 puffs into the lungs every 6 (six) hours as needed for wheezing or shortness of breath. 08/02/19   Tresa Garter, MD  Blood Pressure Monitoring (BLOOD PRESSURE MONITOR/M CUFF) MISC 1 each by Does not apply route 2 (two) times daily. 01/08/17   Dorena Dew, FNP  DEXILANT  30 MG capsule TAKE 1 CAPSULE BY MOUTH EVERY DAY 10/20/19   Tresa Garter, MD  Dexlansoprazole (DEXILANT) 30 MG capsule Take 1 capsule (30 mg total) by mouth daily. 01/29/17   Dorena Dew, FNP  glipiZIDE (GLUCOTROL) 5 MG tablet TAKE 1 TABLET BY MOUTH TWICE A DAY BEFORE MEALS 11/04/19   Dorena Dew, FNP  HYDROcodone-homatropine [facility]) 5-1.5 MG/5ML syrup Take 5 mLs by mouth every 6 (six) hours as needed for cough. 05/16/19   [name], FNP  levothyroxine (SYNTHROID) 75 MCG tablet TAKE 1 TABLET BY MOUTH EVERY DAY 01/02/20   Dorena Dew, FNP  meclizine (ANTIVERT) 25 MG tablet Take 1 tablet (25 mg total) by mouth 3 (three) times daily as needed for dizziness. 05/16/19   [name], FNP  metFORMIN (GLUCOPHAGE) 500 MG tablet Take 0.5 tablets (250 mg total) by mouth 2 (two) times daily with a meal. 05/16/19   [name], FNP  metoprolol tartrate (LOPRESSOR) 25 MG tablet Take 1 tablet (25 mg total) by mouth 2 (two) times daily. 08/02/19   Tresa Garter, MD  nitroGLYCERIN (NITROSTAT) 0.4 MG SL tablet Place 1 tablet (0.4 mg total) under the tongue every  5 (five) minutes as needed for chest pain. 05/16/19 08/14/19  [name], FNP  NONFORMULARY OR COMPOUNDED ITEM Peripheral Neuropathy Cream: Bupivacaine 1%, Doxepin 3%, Gabapentin 6%, Pentoxifylline 3%, Topiramate 1% Order faxed to [facility] 06/22/19   Marzetta Board, DPM  ondansetron (ZOFRAN ODT) 8 MG disintegrating tablet Take 1 tablet (8 mg total) by mouth every 8 (eight) hours as needed for nausea or vomiting. 01/05/20   Fayez Sturgell, MD    Allergies Ampicillin   REVIEW OF SYSTEMS  Negative except as noted here or in the History of Present Illness.   PHYSICAL EXAMINATION  Initial Vital Signs Blood pressure (!) 122/98, pulse (!) 101, temperature 98.1 F (36.7 C), resp. rate 18, height 5\' 1"  (1.549 m), weight 76.2 kg, SpO2 98 %.  Examination General: Well-developed, well-nourished female in no  acute distress; appearance consistent with age of record HENT: normocephalic; atraumatic Eyes: pupils equal, round and reactive to light; extraocular muscles intact; bilateral pseudophakia Neck: supple Heart: regular rate and rhythm Lungs: clear to auscultation bilaterally Abdomen: soft; nondistended; lower abdominal tenderness; bowel sounds present Extremities: No deformity; full range of motion; pulses normal Neurologic: Awake, alert; motor function intact in all extremities and symmetric; no facial droop Skin: Warm and dry Psychiatric: Normal mood and affect   RESULTS  Summary of this visit's results, reviewed and interpreted by myself:   EKG Interpretation  Date/Time:    Ventricular Rate:    PR Interval:    QRS Duration:   QT Interval:    QTC Calculation:   R Axis:     Text Interpretation:        Laboratory Studies: Results for orders placed or performed during the hospital encounter of 01/05/20 (from the past 24 hour(s))  CBC with Differential     Status: Abnormal   Collection Time: 01/05/20 12:53 AM  Result Value Ref Range   WBC 26.3 (H) 4.0 - 10.5 K/uL   RBC 4.52 3.87 - 5.11 MIL/uL   Hemoglobin 8.3 (L) 12.0 - 15.0 g/dL   HCT 30.5 (L) 36.0 - 46.0 %   MCV 67.5 (L) 80.0 - 100.0 fL   MCH 18.4 (L) 26.0 - 34.0 pg   MCHC 27.2 (L) 30.0 - 36.0 g/dL   RDW 19.7 (H) 11.5 - 15.5 %   Platelets 496 (H) 150 - 400 K/uL   nRBC 0.0 0.0 - 0.2 %   Neutrophils Relative % 80 %   Neutro Abs 21.4 (H) 1.7 - 7.7 K/uL   Lymphocytes Relative 11 %   Lymphs Abs 2.9 0.7 - 4.0 K/uL   Monocytes Relative 6 %   Monocytes Absolute 1.6 (H) 0.1 - 1.0 K/uL   Eosinophils Relative 1 %   Eosinophils Absolute 0.1 0.0 - 0.5 K/uL   Basophils Relative 1 %   Basophils Absolute 0.1 0.0 - 0.1 K/uL   WBC Morphology MORPHOLOGY UNREMARKABLE    Smear Review PLATELET COUNT CONFIRMED BY SMEAR    Immature Granulocytes 1 %   Abs Immature Granulocytes 0.25 (H) 0.00 - 0.07 K/uL   Tear Drop Cells PRESENT     Polychromasia PRESENT    Target Cells PRESENT    Ovalocytes PRESENT    Stomatocytes PRESENT    Giant PLTs PRESENT   Comprehensive metabolic panel     Status: Abnormal   Collection Time: 01/05/20 12:53 AM  Result Value Ref Range   Sodium 133 (L) 135 - 145 mmol/L   Potassium 3.6 3.5 - 5.1 mmol/L   Chloride 101 98 -  111 mmol/L   CO2 20 (L) 22 - 32 mmol/L   Glucose, Bld 241 (H) 70 - 99 mg/dL   BUN 18 8 - 23 mg/dL   Creatinine, Ser 0.98 0.44 - 1.00 mg/dL   Calcium 9.1 8.9 - 10.3 mg/dL   Total Protein 8.1 6.5 - 8.1 g/dL   Albumin 3.9 3.5 - 5.0 g/dL   AST 18 15 - 41 U/L   ALT 12 0 - 44 U/L   Alkaline Phosphatase 84 38 - 126 U/L   Total Bilirubin 0.3 0.3 - 1.2 mg/dL   GFR calc non Af Amer 56 (L) >60 mL/min   GFR calc Af Amer >60 >60 mL/min   Anion gap 12 5 - 15   Imaging Studies: No results found.  ED COURSE and MDM  Nursing notes, initial and subsequent vitals signs, including pulse oximetry, reviewed and interpreted by myself.  Vitals:   01/05/20 0048 01/05/20 0051  BP: (!) 122/98   Pulse: (!) 101   Resp: 18   Temp:  98.1 F (36.7 C)  SpO2: 98%   Weight: 76.2 kg   Height: 5\' 1"  (1.549 m)    Medications  sodium chloride 0.9 % bolus 1,000 mL (has no administration in time range)  ondansetron (ZOFRAN) injection 4 mg (4 mg Intravenous Given 01/05/20 0100)  sodium chloride 0.9 % bolus 1,000 mL ( Intravenous Stopped 01/05/20 0207)  dicyclomine (BENTYL) injection 20 mg (20 mg Intramuscular Given 01/05/20 0142)  fentaNYL (SUBLIMAZE) injection 25 mcg (25 mcg Intravenous Given 01/05/20 0223)   3:24 AM Patient's pain is down to a 1 out of 10 and she is tolerating oral liquids.  Her anemia appears chronic however her leukocytosis is new.  It is unclear if this represents response to an infectious disease or a more chronic condition.  A smear has been sent for pathologist review.  She and her daughter were advised to follow-up with her PCP regarding the results.  She was advised to return  for worsening symptoms for further work-up but I not believe a CT scan is indicated at this time.  Right now her abdomen is soft and nontender.   PROCEDURES  Procedures   ED DIAGNOSES     ICD-10-CM   1. Gastroenteritis  K52.9   2. Microcytic anemia  D50.9   3. Neutrophilic leukocytosis  123456        Lilibeth Opie, Jenny Reichmann, MD 01/05/20 [phone]

## 2020-01-05 NOTE — ED Notes (Signed)
Lab informed of smear order.

## 2020-01-05 NOTE — ED Triage Notes (Signed)
pt c/o abd pain and n/v/d x 2 hrs

## 2020-01-10 ENCOUNTER — Ambulatory Visit: Payer: Medicaid Other | Admitting: Family Medicine

## 2020-01-20 ENCOUNTER — Telehealth (HOSPITAL_COMMUNITY): Payer: Self-pay | Admitting: Emergency Medicine

## 2020-01-20 NOTE — Telephone Encounter (Signed)
Phone call by Clarise Cruz complete. Niece states she got instructions via email. Denies questions.  Marchia Bond RN Navigator Cardiac Imaging [facility] Heart and Vascular Services [phone] Office  [phone] Cell

## 2020-01-23 ENCOUNTER — Telehealth (HOSPITAL_COMMUNITY): Payer: Self-pay | Admitting: *Deleted

## 2020-01-23 ENCOUNTER — Other Ambulatory Visit: Payer: Self-pay | Admitting: Family Medicine

## 2020-01-23 ENCOUNTER — Encounter (HOSPITAL_COMMUNITY): Payer: Self-pay

## 2020-01-23 ENCOUNTER — Other Ambulatory Visit: Payer: Self-pay

## 2020-01-23 ENCOUNTER — Ambulatory Visit (HOSPITAL_COMMUNITY)
Admission: RE | Admit: 2020-01-23 | Discharge: 2020-01-23 | Disposition: A | Discharge status: Home / Self Care | Payer: Medicaid Other | Source: Ambulatory Visit | Attending: Cardiovascular Disease | Admitting: Cardiovascular Disease

## 2020-01-23 DIAGNOSIS — R079 Chest pain, unspecified: Secondary | ICD-10-CM | POA: Insufficient documentation

## 2020-01-23 DIAGNOSIS — D242 Benign neoplasm of left breast: Secondary | ICD-10-CM

## 2020-01-23 DIAGNOSIS — N63 Unspecified lump in unspecified breast: Secondary | ICD-10-CM

## 2020-01-23 MED ORDER — NITROGLYCERIN 0.4 MG SL SUBL: 0.8000 mg | SUBLINGUAL_TABLET | Freq: Once | SUBLINGUAL | Status: DC

## 2020-01-23 MED ORDER — DILTIAZEM HCL 25 MG/5ML IV SOLN
5.0000 mg | Freq: Once | INTRAVENOUS | Status: DC
  Filled 2020-01-23: qty 5

## 2020-01-23 MED ORDER — METOPROLOL TARTRATE 5 MG/5ML IV SOLN
5.0000 mg | INTRAVENOUS | Status: DC | PRN
  Administered 2020-01-23 (×2): 5 mg via INTRAVENOUS

## 2020-01-23 MED ORDER — METOPROLOL TARTRATE 5 MG/5ML IV SOLN
INTRAVENOUS | Status: AC
  Administered 2020-01-23: 5 mg
  Filled 2020-01-23: qty 15

## 2020-01-23 MED ORDER — DILTIAZEM HCL 25 MG/5ML IV SOLN
5.0000 mg | Freq: Once | INTRAVENOUS | Status: AC
  Administered 2020-01-23: 5 mg via INTRAVENOUS
  Filled 2020-01-23: qty 5

## 2020-01-23 MED ORDER — DILTIAZEM HCL 25 MG/5ML IV SOLN
INTRAVENOUS | Status: AC
  Administered 2020-01-23: 5 mg
  Filled 2020-01-23: qty 5

## 2020-01-23 NOTE — Telephone Encounter (Signed)
Returning patient call regarding CT cardiac scan.  Questions concerning diabetes medications, NPO directions, and blood pressure medication answered.  Family member expressed understanding.

## 2020-01-23 NOTE — Progress Notes (Signed)
Md notified pt HR after metoprolol and cardizem states to reschedule possibly with more beta blocker at home. Ct heart navigator notified

## 2020-01-24 ENCOUNTER — Telehealth: Payer: Self-pay | Admitting: Hematology and Oncology

## 2020-01-24 ENCOUNTER — Telehealth: Payer: Self-pay

## 2020-01-24 NOTE — Telephone Encounter (Signed)
Received an urgent call from Dr. [name] for anemia. Pt's hgb is 6.8, but asymptomatic per MD. Pt has been scheduled to see Dr. Alvy Bimler on 5/6 at 12:45pm. Dr. Ronnald Ramp will notify the pt. I provided our fax number for the records to be faxed.

## 2020-01-24 NOTE — Telephone Encounter (Signed)
[name], niece called and left a message that she needs to reschedule 5/6 appt.  Called back. She wants to reschedule appt for [place] on 5/6 due to her job. Encouraged her to keep appt and come in with her family member on 5/6. She finally agreed to keep the appt as scheduled.

## 2020-01-26 ENCOUNTER — Inpatient Hospital Stay: Payer: Medicaid Other | Attending: Hematology and Oncology | Admitting: Hematology and Oncology

## 2020-01-26 ENCOUNTER — Encounter: Payer: Self-pay | Admitting: Hematology and Oncology

## 2020-01-26 ENCOUNTER — Other Ambulatory Visit: Payer: Self-pay

## 2020-01-26 ENCOUNTER — Inpatient Hospital Stay: Payer: Medicaid Other

## 2020-01-26 ENCOUNTER — Other Ambulatory Visit: Payer: Self-pay | Admitting: Hematology and Oncology

## 2020-01-26 VITALS — BP 125/82 | HR 81 | Temp 98.2°F | Resp 20

## 2020-01-26 DIAGNOSIS — Z87891 Personal history of nicotine dependence: Secondary | ICD-10-CM | POA: Diagnosis not present

## 2020-01-26 DIAGNOSIS — E079 Disorder of thyroid, unspecified: Secondary | ICD-10-CM | POA: Diagnosis not present

## 2020-01-26 DIAGNOSIS — D75839 Thrombocytosis, unspecified: Secondary | ICD-10-CM

## 2020-01-26 DIAGNOSIS — D473 Essential (hemorrhagic) thrombocythemia: Secondary | ICD-10-CM

## 2020-01-26 DIAGNOSIS — D519 Vitamin B12 deficiency anemia, unspecified: Secondary | ICD-10-CM | POA: Diagnosis not present

## 2020-01-26 DIAGNOSIS — Z7984 Long term (current) use of oral hypoglycemic drugs: Secondary | ICD-10-CM | POA: Insufficient documentation

## 2020-01-26 DIAGNOSIS — E538 Deficiency of other specified B group vitamins: Secondary | ICD-10-CM | POA: Insufficient documentation

## 2020-01-26 DIAGNOSIS — R0609 Other forms of dyspnea: Secondary | ICD-10-CM

## 2020-01-26 DIAGNOSIS — D509 Iron deficiency anemia, unspecified: Secondary | ICD-10-CM

## 2020-01-26 DIAGNOSIS — I10 Essential (primary) hypertension: Secondary | ICD-10-CM | POA: Insufficient documentation

## 2020-01-26 DIAGNOSIS — E1165 Type 2 diabetes mellitus with hyperglycemia: Secondary | ICD-10-CM | POA: Diagnosis not present

## 2020-01-26 DIAGNOSIS — Z79899 Other long term (current) drug therapy: Secondary | ICD-10-CM | POA: Insufficient documentation

## 2020-01-26 DIAGNOSIS — K5900 Constipation, unspecified: Secondary | ICD-10-CM | POA: Diagnosis not present

## 2020-01-26 DIAGNOSIS — R0600 Dyspnea, unspecified: Secondary | ICD-10-CM

## 2020-01-26 LAB — CBC WITH DIFFERENTIAL/PLATELET
Abs Immature Granulocytes: 0.02 10*3/uL (ref 0.00–0.07)
Basophils Absolute: 0.1 10*3/uL (ref 0.0–0.1)
Basophils Relative: 1 %
Eosinophils Absolute: 0.1 10*3/uL (ref 0.0–0.5)
Eosinophils Relative: 2 %
HCT: 26.6 % — ABNORMAL LOW (ref 36.0–46.0)
Hemoglobin: 7.1 g/dL — ABNORMAL LOW (ref 12.0–15.0)
Immature Granulocytes: 0 %
Lymphocytes Relative: 28 %
Lymphs Abs: 2.6 10*3/uL (ref 0.7–4.0)
MCH: 17.7 pg — ABNORMAL LOW (ref 26.0–34.0)
MCHC: 26.7 g/dL — ABNORMAL LOW (ref 30.0–36.0)
MCV: 66.2 fL — ABNORMAL LOW (ref 80.0–100.0)
Monocytes Absolute: 0.8 10*3/uL (ref 0.1–1.0)
Monocytes Relative: 8 %
Neutro Abs: 5.7 10*3/uL (ref 1.7–7.7)
Neutrophils Relative %: 61 %
Platelets: 532 10*3/uL — ABNORMAL HIGH (ref 150–400)
RBC: 4.02 MIL/uL (ref 3.87–5.11)
RDW: 18.8 % — ABNORMAL HIGH (ref 11.5–15.5)
WBC: 9.3 10*3/uL (ref 4.0–10.5)
nRBC: 0 % (ref 0.0–0.2)

## 2020-01-26 LAB — ABO/RH: ABO/RH(D): A POS

## 2020-01-26 LAB — SAMPLE TO BLOOD BANK

## 2020-01-26 LAB — PREPARE RBC (CROSSMATCH)

## 2020-01-26 MED ORDER — DIPHENHYDRAMINE HCL 25 MG PO CAPS
25.0000 mg | ORAL_CAPSULE | Freq: Once | ORAL | Status: AC
  Administered 2020-01-26: 25 mg via ORAL

## 2020-01-26 MED ORDER — ACETAMINOPHEN 325 MG PO TABS
650.0000 mg | ORAL_TABLET | Freq: Once | ORAL | Status: AC
  Administered 2020-01-26: 650 mg via ORAL

## 2020-01-26 MED ORDER — ACETAMINOPHEN 325 MG PO TABS
ORAL_TABLET | ORAL | Status: AC
  Filled 2020-01-26: qty 2

## 2020-01-26 MED ORDER — CYANOCOBALAMIN 1000 MCG/ML IJ SOLN
1000.0000 ug | Freq: Once | INTRAMUSCULAR | Status: AC
  Administered 2020-01-26: 1000 ug via INTRAMUSCULAR

## 2020-01-26 MED ORDER — SODIUM CHLORIDE 0.9% IV SOLUTION
250.0000 mL | Freq: Once | INTRAVENOUS | Status: AC
  Administered 2020-01-26: 250 mL via INTRAVENOUS
  Filled 2020-01-26: qty 250

## 2020-01-26 MED ORDER — CYANOCOBALAMIN 1000 MCG/ML IJ SOLN
INTRAMUSCULAR | Status: AC
  Filled 2020-01-26: qty 1

## 2020-01-26 MED ORDER — DIPHENHYDRAMINE HCL 25 MG PO CAPS
ORAL_CAPSULE | ORAL | Status: AC
  Filled 2020-01-26: qty 1

## 2020-01-26 NOTE — Progress Notes (Unsigned)
a 

## 2020-01-26 NOTE — Patient Instructions (Signed)

## 2020-01-26 NOTE — Progress Notes (Signed)
Reported hgb 7.1 to Dr. Alvy Bimler.

## 2020-01-27 ENCOUNTER — Other Ambulatory Visit: Payer: Self-pay

## 2020-01-27 ENCOUNTER — Other Ambulatory Visit (HOSPITAL_COMMUNITY): Payer: Self-pay | Admitting: Emergency Medicine

## 2020-01-27 ENCOUNTER — Encounter: Payer: Self-pay | Admitting: Hematology and Oncology

## 2020-01-27 DIAGNOSIS — D473 Essential (hemorrhagic) thrombocythemia: Secondary | ICD-10-CM | POA: Insufficient documentation

## 2020-01-27 DIAGNOSIS — R0609 Other forms of dyspnea: Secondary | ICD-10-CM | POA: Insufficient documentation

## 2020-01-27 DIAGNOSIS — D509 Iron deficiency anemia, unspecified: Secondary | ICD-10-CM

## 2020-01-27 MED ORDER — METOPROLOL TARTRATE 100 MG PO TABS
100.0000 mg | ORAL_TABLET | Freq: Once | ORAL | 0 refills | Status: DC
Start: 2020-01-27 — End: 2020-01-30

## 2020-01-27 NOTE — Assessment & Plan Note (Signed)
She has profound, severe iron deficiency anemia, likely secondary to GI blood loss The patient is adamant that she refuse colonoscopy When I ask whether she would be interested to undergo GI evaluation for at least upper endoscopy, the patient is undecided Given the severity of her iron deficiency anemia and profound symptoms with shortness of breath and chest discomfort, I recommend we proceed with blood transfusion first  We discussed some of the risks, benefits, and alternatives of blood transfusions. The patient is symptomatic from anemia and the hemoglobin level is critically low.  Some of the side-effects to be expected including risks of transfusion reactions, chills, infection, syndrome of volume overload and risk of hospitalization from various reasons and the patient is willing to proceed and went ahead to sign consent today. I will order 2 units of blood this week Starting next week, she will receive intravenous iron infusion weekly x2 I will see her back in a month for further follow-up

## 2020-01-27 NOTE — Assessment & Plan Note (Signed)
This is multifactorial She has significant smoking history She also have poorly controlled diabetes  Her shortness of breath is likely exacerbated by profound anemia She has appointment to follow-up with cardiologist with further work-up

## 2020-01-27 NOTE — Assessment & Plan Note (Signed)
The cause of the thrombocytosis is likely reactive in nature Observe only for now

## 2020-01-27 NOTE — Assessment & Plan Note (Signed)
She is also noted to have severe vitamin B12 deficiency I recommend weekly vitamin B12 replacement therapy and I will see her back in a month for further follow-up

## 2020-01-27 NOTE — Progress Notes (Signed)
[facility] NOTE  Patient Care Team: [name], MD as PCP - General (Family Medicine)  CHIEF COMPLAINTS/PURPOSE OF CONSULTATION:  Severe anemia, component of iron deficiency and vitamin B12 deficiency  HISTORY OF PRESENTING ILLNESS:  Bailey Walker 74 y.o. female is here because of recent findings of profound anemia She is here accompanied by her niece, [name] The patient does not speak [place] and [name] acts as an interpreter The patient has significant smoking history for 45 years, only quit several years ago She has been complaining of profound fatigue and shortness of breath on minimal exertion She denies chest pain no dizziness She has noted intermittent hemorrhoidal bleeding especially when she is constipated The patient has never had screening colonoscopy and refused screening colonoscopy She was found to have profound anemia with recent blood work a week ago on April 26 Her hemoglobin measure less than 7 with severe iron deficiency Her recent blood work also was showed severe vitamin B12 deficiency  The patient denies over the counter NSAID ingestion. She was on on antiplatelets agents.  She had no prior history or diagnosis of cancer.  She denies any pica and eats a variety of diet. She never donated blood or received blood transfusion  MEDICAL HISTORY:  Past Medical History:  Diagnosis Date  . Diabetes mellitus without complication ([place])   . Hypertension   . SVT (supraventricular tachycardia) ([place])   . Thyroid disease     SURGICAL HISTORY: Past Surgical History:  Procedure Laterality Date  . APPENDECTOMY    . BREAST SURGERY    . EYE SURGERY Bilateral    april and march 2019 for cataracts.   . fatty gland     left wrist , right breast  . KNEE SURGERY     left knee    SOCIAL HISTORY: Social History   Socioeconomic History  . Marital status: Single    Spouse name: Not on file  . Number of children: 0  . Years of education: Not  on file  . Highest education level: Not on file  Occupational History  . Occupation: retired  Tobacco Use  . Smoking status: Former Smoker    Quit date: 12/29/2013    Years since quitting: 6.0  . Smokeless tobacco: Never Used  Substance and Sexual Activity  . Alcohol use: No  . Drug use: No  . Sexual activity: Not on file  Other Topics Concern  . Not on file  Social History Narrative  . Not on file   Social Determinants of Health   Financial Resource Strain:   . Difficulty of Paying Living Expenses:   Food Insecurity:   . Worried About Charity fundraiser in the Last Year:   . Arboriculturist in the Last Year:   Transportation Needs:   . Film/video editor (Medical):   Marland Kitchen Lack of Transportation (Non-Medical):   Physical Activity:   . Days of Exercise per Week:   . Minutes of Exercise per Session:   Stress:   . Feeling of Stress :   Social Connections:   . Frequency of Communication with Friends and Family:   . Frequency of Social Gatherings with Friends and Family:   . Attends Religious Services:   . Active Member of Clubs or Organizations:   . Attends Archivist Meetings:   Marland Kitchen Marital Status:   Intimate Partner Violence:   . Fear of Current or Ex-Partner:   . Emotionally Abused:   Marland Kitchen Physically  Abused:   . Sexually Abused:     FAMILY HISTORY: Family History  Problem Relation Age of Onset  . Breast cancer Cousin        mat and pat sides  . Cancer Sister        cancer everywhere  . Colon cancer Neg Hx   . Esophageal cancer Neg Hx   . Rectal cancer Neg Hx     ALLERGIES:  is allergic to ampicillin.  MEDICATIONS:  Current Outpatient Medications  Medication Sig Dispense Refill  . Dextromethorphan-guaiFENesin 10-100 MG/5ML liquid Take 5 mLs by mouth every 12 (twelve) hours.    Marland Kitchen albuterol (VENTOLIN HFA) 108 (90 Base) MCG/ACT inhaler Inhale 2 puffs into the lungs every 6 (six) hours as needed for wheezing or shortness of breath. 6.7 g 2  . DEXILANT  30 MG capsule TAKE 1 CAPSULE BY MOUTH EVERY DAY 30 capsule 5  . glipiZIDE (GLUCOTROL) 5 MG tablet TAKE 1 TABLET BY MOUTH TWICE A DAY BEFORE MEALS 60 tablet 3  . levothyroxine (SYNTHROID) 75 MCG tablet TAKE 1 TABLET BY MOUTH EVERY DAY 30 tablet 0  . metFORMIN (GLUCOPHAGE) 500 MG tablet Take 0.5 tablets (250 mg total) by mouth 2 (two) times daily with a meal. 90 tablet 3  . metoprolol tartrate (LOPRESSOR) 25 MG tablet Take 1 tablet (25 mg total) by mouth 2 (two) times daily. 180 tablet 3  . nitroGLYCERIN (NITROSTAT) 0.4 MG SL tablet Place 1 tablet (0.4 mg total) under the tongue every 5 (five) minutes as needed for chest pain. 11 tablet 6   No current facility-administered medications for this visit.    REVIEW OF SYSTEMS:   Constitutional: Denies fevers, chills or abnormal night sweats Eyes: Denies blurriness of vision, double vision or watery eyes Ears, nose, mouth, throat, and face: Denies mucositis or sore throat Cardiovascular: Denies palpitation, chest discomfort or lower extremity swelling Gastrointestinal:  Denies nausea, heartburn or change in bowel habits Skin: Denies abnormal skin rashes Lymphatics: Denies new lymphadenopathy or easy bruising Neurological:Denies numbness, tingling or new weaknesses Behavioral/Psych: Mood is stable, no new changes  All other systems were reviewed with the patient and are negative.  PHYSICAL EXAMINATION: ECOG PERFORMANCE STATUS: 2 - Symptomatic, <50% confined to bed  Vitals:   01/26/20 1222 01/26/20 1240  BP: 140/64 140/64  Pulse: 86 86  Resp: 18 18  Temp: 98.2 F (36.8 C) 98.2 F (36.8 C)  SpO2: 100% 100%   Filed Weights   01/26/20 1222 01/26/20 1240  Weight: 163 lb (73.9 kg) 163 lb (73.9 kg)    GENERAL:alert, no distress and comfortable.  She looks extremely pale SKIN: skin color, texture, turgor are normal, no rashes or significant lesions EYES: normal, conjunctiva are pink and non-injected, sclera clear OROPHARYNX:no exudate, no  erythema and lips, buccal mucosa, and tongue normal  NECK: supple, thyroid normal size, non-tender, without nodularity LYMPH:  no palpable lymphadenopathy in the cervical, axillary or inguinal LUNGS: clear to auscultation and percussion with normal breathing effort HEART: regular rate & rhythm and no murmurs and no lower extremity edema ABDOMEN:abdomen soft, non-tender and normal bowel sounds Musculoskeletal:no cyanosis of digits and no clubbing  PSYCH: alert & oriented x 3 with fluent speech NEURO: no focal motor/sensory deficits  Lab Results  Component Value Date   WBC 9.3 01/26/2020   HGB 7.1 (L) 01/26/2020   HCT 26.6 (L) 01/26/2020   MCV 66.2 (L) 01/26/2020   PLT 532 (H) 01/26/2020    ASSESSMENT & PLAN:  Iron  deficiency anemia She has profound, severe iron deficiency anemia, likely secondary to GI blood loss The patient is adamant that she refuse colonoscopy When I ask whether she would be interested to undergo GI evaluation for at least upper endoscopy, the patient is undecided Given the severity of her iron deficiency anemia and profound symptoms with shortness of breath and chest discomfort, I recommend we proceed with blood transfusion first  We discussed some of the risks, benefits, and alternatives of blood transfusions. The patient is symptomatic from anemia and the hemoglobin level is critically low.  Some of the side-effects to be expected including risks of transfusion reactions, chills, infection, syndrome of volume overload and risk of hospitalization from various reasons and the patient is willing to proceed and went ahead to sign consent today. I will order 2 units of blood this week Starting next week, she will receive intravenous iron infusion weekly x2 I will see her back in a month for further follow-up  Vitamin B12 deficiency anemia She is also noted to have severe vitamin B12 deficiency I recommend weekly vitamin B12 replacement therapy and I will see her back  in a month for further follow-up  Thrombocytosis ([place]) The cause of the thrombocytosis is likely reactive in nature Observe only for now  Dyspnea on exertion This is multifactorial She has significant smoking history She also have poorly controlled diabetes  Her shortness of breath is likely exacerbated by profound anemia She has appointment to follow-up with cardiologist with further work-up  Orders Placed This Encounter  Procedures  . CBC with Differential/Platelet    Standing Status:   Standing    Number of Occurrences:   22    Standing Expiration Date:   01/25/2021  . Sample to Blood Bank    Standing Status:   Standing    Number of Occurrences:   33    Standing Expiration Date:   01/25/2021  . Prepare RBC (crossmatch)    Standing Status:   Standing    Number of Occurrences:   1    Order Specific Question:   # of Units    Answer:   2 units    Order Specific Question:   Transfusion Indications    Answer:   Symptomatic Anemia    Order Specific Question:   Number of Units to Keep Ahead    Answer:   NO units ahead    Order Specific Question:   Instructions:    Answer:   Transfuse    Order Specific Question:   If emergent release call blood bank    Answer:   Not emergent release    All questions were answered. The patient knows to call the clinic with any problems, questions or concerns.  The total time spent in the appointment was 60 minutes encounter with patients including review of chart and various tests results, discussions about plan of care and coordination of care plan  Heath Lark, MD 5/7/20217:59 AM       Heath Lark, MD 01/27/20 7:59 AM

## 2020-01-28 ENCOUNTER — Other Ambulatory Visit: Payer: Self-pay

## 2020-01-28 ENCOUNTER — Inpatient Hospital Stay: Payer: Medicaid Other

## 2020-01-28 DIAGNOSIS — D509 Iron deficiency anemia, unspecified: Secondary | ICD-10-CM | POA: Diagnosis not present

## 2020-01-28 MED ORDER — ACETAMINOPHEN 325 MG PO TABS
ORAL_TABLET | ORAL | Status: AC
  Filled 2020-01-28: qty 2

## 2020-01-28 MED ORDER — SODIUM CHLORIDE 0.9% IV SOLUTION
250.0000 mL | Freq: Once | INTRAVENOUS | Status: AC
  Administered 2020-01-28: 250 mL via INTRAVENOUS
  Filled 2020-01-28: qty 250

## 2020-01-28 MED ORDER — ACETAMINOPHEN 325 MG PO TABS
650.0000 mg | ORAL_TABLET | Freq: Once | ORAL | Status: AC
  Administered 2020-01-28: 09:00:00 650 mg via ORAL

## 2020-01-28 MED ORDER — DIPHENHYDRAMINE HCL 25 MG PO CAPS
ORAL_CAPSULE | ORAL | Status: AC
  Filled 2020-01-28: qty 1

## 2020-01-28 MED ORDER — DIPHENHYDRAMINE HCL 25 MG PO CAPS
25.0000 mg | ORAL_CAPSULE | Freq: Once | ORAL | Status: AC
  Administered 2020-01-28: 25 mg via ORAL

## 2020-01-28 NOTE — Patient Instructions (Signed)

## 2020-01-29 LAB — BPAM RBC
Blood Product Expiration Date: 202106032359
Blood Product Expiration Date: 202106032359
ISSUE DATE / TIME: 202105061501
ISSUE DATE / TIME: 202105080936
Unit Type and Rh: 6200
Unit Type and Rh: 6200

## 2020-01-29 LAB — TYPE AND SCREEN
ABO/RH(D): A POS
Antibody Screen: NEGATIVE
Unit division: 0
Unit division: 0

## 2020-01-30 ENCOUNTER — Telehealth (HOSPITAL_COMMUNITY): Payer: Self-pay | Admitting: *Deleted

## 2020-01-30 ENCOUNTER — Telehealth: Payer: Self-pay | Admitting: Hematology and Oncology

## 2020-01-30 ENCOUNTER — Other Ambulatory Visit (HOSPITAL_COMMUNITY): Payer: Self-pay | Admitting: *Deleted

## 2020-01-30 MED ORDER — METOPROLOL TARTRATE 100 MG PO TABS: 100.0000 mg | ORAL_TABLET | Freq: Once | ORAL | 0 refills | Status: DC

## 2020-01-30 NOTE — Telephone Encounter (Signed)
Reaching out to patient to offer assistance regarding upcoming cardiac imaging study; pt verbalizes understanding of appt date/time, parking situation and where to check in, pre-test NPO status and medications ordered, and verified current allergies; name and call back number provided for further questions should they arise Shye Doty Tai RN Navigator Cardiac Imaging [facility] [phone] office [phone] cell 

## 2020-01-30 NOTE — Telephone Encounter (Signed)
Spoke with pt niece sandra. Pt is aware of appts per 5/7 sch msg.

## 2020-01-31 ENCOUNTER — Encounter (HOSPITAL_COMMUNITY): Payer: Self-pay

## 2020-01-31 ENCOUNTER — Ambulatory Visit (HOSPITAL_COMMUNITY)
Admission: RE | Admit: 2020-01-31 | Discharge: 2020-01-31 | Disposition: A | Discharge status: Home / Self Care | Payer: Medicaid Other | Source: Ambulatory Visit | Attending: Cardiovascular Disease | Admitting: Cardiovascular Disease

## 2020-01-31 ENCOUNTER — Other Ambulatory Visit: Payer: Self-pay

## 2020-01-31 MED ORDER — NITROGLYCERIN 0.4 MG SL SUBL: 0.8000 mg | SUBLINGUAL_TABLET | Freq: Once | SUBLINGUAL | Status: DC

## 2020-01-31 NOTE — Progress Notes (Signed)
Patient's daughter who is translating for her states she refuses to take SL nitro as it had made her vomit before. Dr Marigene Ehlers called and updated, exam aborted and family verbalized understanding.

## 2020-02-02 ENCOUNTER — Inpatient Hospital Stay: Payer: Medicaid Other

## 2020-02-03 ENCOUNTER — Telehealth: Payer: Self-pay | Admitting: Hematology and Oncology

## 2020-02-03 ENCOUNTER — Ambulatory Visit: Payer: Medicaid Other

## 2020-02-03 NOTE — Telephone Encounter (Signed)
R/s appt per 5/13 sch message - called pt niece unable to reach . Left message with appt date and time

## 2020-02-07 ENCOUNTER — Other Ambulatory Visit: Payer: Self-pay | Admitting: Family Medicine

## 2020-02-07 DIAGNOSIS — E039 Hypothyroidism, unspecified: Secondary | ICD-10-CM

## 2020-02-10 ENCOUNTER — Inpatient Hospital Stay: Payer: Medicaid Other

## 2020-02-10 ENCOUNTER — Other Ambulatory Visit: Payer: Self-pay

## 2020-02-10 VITALS — BP 110/56 | HR 80 | Temp 98.4°F | Resp 17

## 2020-02-10 DIAGNOSIS — D509 Iron deficiency anemia, unspecified: Secondary | ICD-10-CM

## 2020-02-10 MED ORDER — CYANOCOBALAMIN 1000 MCG/ML IJ SOLN
INTRAMUSCULAR | Status: AC
  Filled 2020-02-10: qty 1

## 2020-02-10 MED ORDER — CYANOCOBALAMIN 1000 MCG/ML IJ SOLN
1000.0000 ug | Freq: Once | INTRAMUSCULAR | Status: AC
  Administered 2020-02-10: 1000 ug via INTRAMUSCULAR

## 2020-02-10 MED ORDER — SODIUM CHLORIDE 0.9 % IV SOLN
510.0000 mg | Freq: Once | INTRAVENOUS | Status: AC
  Administered 2020-02-10: 510 mg via INTRAVENOUS
  Filled 2020-02-10: qty 510

## 2020-02-10 MED ORDER — SODIUM CHLORIDE 0.9 % IV SOLN
Freq: Once | INTRAVENOUS | Status: AC
  Filled 2020-02-10: qty 250

## 2020-02-10 NOTE — Patient Instructions (Signed)

## 2020-02-13 ENCOUNTER — Ambulatory Visit
Admission: RE | Admit: 2020-02-13 | Discharge: 2020-02-13 | Disposition: A | Discharge status: Home / Self Care | Payer: Medicaid Other | Source: Ambulatory Visit | Attending: Family Medicine | Admitting: Family Medicine

## 2020-02-13 ENCOUNTER — Other Ambulatory Visit: Payer: Self-pay

## 2020-02-13 DIAGNOSIS — D242 Benign neoplasm of left breast: Secondary | ICD-10-CM

## 2020-02-13 DIAGNOSIS — N63 Unspecified lump in unspecified breast: Secondary | ICD-10-CM

## 2020-02-17 ENCOUNTER — Other Ambulatory Visit: Payer: Self-pay

## 2020-02-17 ENCOUNTER — Inpatient Hospital Stay: Payer: Medicaid Other

## 2020-02-17 VITALS — BP 137/70 | HR 79 | Temp 98.3°F | Resp 18

## 2020-02-17 DIAGNOSIS — D509 Iron deficiency anemia, unspecified: Secondary | ICD-10-CM

## 2020-02-17 MED ORDER — CYANOCOBALAMIN 1000 MCG/ML IJ SOLN
INTRAMUSCULAR | Status: AC
  Filled 2020-02-17: qty 1

## 2020-02-17 MED ORDER — CYANOCOBALAMIN 1000 MCG/ML IJ SOLN
1000.0000 ug | Freq: Once | INTRAMUSCULAR | Status: AC
  Administered 2020-02-17: 1000 ug via INTRAMUSCULAR

## 2020-02-17 MED ORDER — SODIUM CHLORIDE 0.9 % IV SOLN
Freq: Once | INTRAVENOUS | Status: AC
  Filled 2020-02-17: qty 250

## 2020-02-17 MED ORDER — SODIUM CHLORIDE 0.9 % IV SOLN
510.0000 mg | Freq: Once | INTRAVENOUS | Status: AC
  Administered 2020-02-17: 510 mg via INTRAVENOUS
  Filled 2020-02-17: qty 17

## 2020-02-17 NOTE — Patient Instructions (Signed)

## 2020-03-09 ENCOUNTER — Ambulatory Visit: Payer: Medicaid Other

## 2020-03-09 ENCOUNTER — Ambulatory Visit: Payer: Medicaid Other | Admitting: Hematology and Oncology

## 2020-03-09 ENCOUNTER — Other Ambulatory Visit: Payer: Medicaid Other

## 2020-03-09 ENCOUNTER — Other Ambulatory Visit: Payer: Self-pay | Admitting: Hematology and Oncology

## 2020-03-09 DIAGNOSIS — D509 Iron deficiency anemia, unspecified: Secondary | ICD-10-CM

## 2020-03-12 ENCOUNTER — Inpatient Hospital Stay: Payer: Medicaid Other

## 2020-03-12 ENCOUNTER — Other Ambulatory Visit: Payer: Self-pay

## 2020-03-12 ENCOUNTER — Encounter: Payer: Self-pay | Admitting: Hematology and Oncology

## 2020-03-12 ENCOUNTER — Inpatient Hospital Stay (HOSPITAL_BASED_OUTPATIENT_CLINIC_OR_DEPARTMENT_OTHER): Payer: Medicaid Other | Admitting: Hematology and Oncology

## 2020-03-12 ENCOUNTER — Inpatient Hospital Stay: Payer: Medicaid Other | Attending: Hematology and Oncology

## 2020-03-12 VITALS — BP 141/76 | HR 119 | Temp 97.8°F | Resp 18 | Ht 61.0 in | Wt 165.2 lb

## 2020-03-12 VITALS — BP 132/83 | HR 101 | Resp 18

## 2020-03-12 DIAGNOSIS — D75839 Thrombocytosis, unspecified: Secondary | ICD-10-CM

## 2020-03-12 DIAGNOSIS — E538 Deficiency of other specified B group vitamins: Secondary | ICD-10-CM | POA: Diagnosis present

## 2020-03-12 DIAGNOSIS — D509 Iron deficiency anemia, unspecified: Secondary | ICD-10-CM

## 2020-03-12 DIAGNOSIS — K5900 Constipation, unspecified: Secondary | ICD-10-CM | POA: Diagnosis not present

## 2020-03-12 DIAGNOSIS — Z79899 Other long term (current) drug therapy: Secondary | ICD-10-CM | POA: Insufficient documentation

## 2020-03-12 DIAGNOSIS — D473 Essential (hemorrhagic) thrombocythemia: Secondary | ICD-10-CM

## 2020-03-12 DIAGNOSIS — R0600 Dyspnea, unspecified: Secondary | ICD-10-CM

## 2020-03-12 DIAGNOSIS — Z7984 Long term (current) use of oral hypoglycemic drugs: Secondary | ICD-10-CM | POA: Insufficient documentation

## 2020-03-12 DIAGNOSIS — R0609 Other forms of dyspnea: Secondary | ICD-10-CM

## 2020-03-12 DIAGNOSIS — D519 Vitamin B12 deficiency anemia, unspecified: Secondary | ICD-10-CM | POA: Diagnosis not present

## 2020-03-12 LAB — IRON AND TIBC
Iron: 42 ug/dL (ref 41–142)
Saturation Ratios: 14 % — ABNORMAL LOW (ref 21–57)
TIBC: 299 ug/dL (ref 236–444)
UIBC: 257 ug/dL (ref 120–384)

## 2020-03-12 LAB — CBC WITH DIFFERENTIAL/PLATELET
Abs Immature Granulocytes: 0.03 10*3/uL (ref 0.00–0.07)
Basophils Absolute: 0.1 10*3/uL (ref 0.0–0.1)
Basophils Relative: 1 %
Eosinophils Absolute: 0.1 10*3/uL (ref 0.0–0.5)
Eosinophils Relative: 1 %
HCT: 35.6 % — ABNORMAL LOW (ref 36.0–46.0)
Hemoglobin: 11.2 g/dL — ABNORMAL LOW (ref 12.0–15.0)
Immature Granulocytes: 0 %
Lymphocytes Relative: 18 %
Lymphs Abs: 1.9 10*3/uL (ref 0.7–4.0)
MCH: 25.9 pg — ABNORMAL LOW (ref 26.0–34.0)
MCHC: 31.5 g/dL (ref 30.0–36.0)
MCV: 82.4 fL (ref 80.0–100.0)
Monocytes Absolute: 0.7 10*3/uL (ref 0.1–1.0)
Monocytes Relative: 7 %
Neutro Abs: 7.6 10*3/uL (ref 1.7–7.7)
Neutrophils Relative %: 73 %
Platelets: 302 10*3/uL (ref 150–400)
RBC: 4.32 MIL/uL (ref 3.87–5.11)
WBC: 10.3 10*3/uL (ref 4.0–10.5)
nRBC: 0 % (ref 0.0–0.2)

## 2020-03-12 LAB — FERRITIN: Ferritin: 197 ng/mL (ref 11–307)

## 2020-03-12 LAB — SAMPLE TO BLOOD BANK

## 2020-03-12 MED ORDER — CYANOCOBALAMIN 1000 MCG/ML IJ SOLN
1000.0000 ug | Freq: Once | INTRAMUSCULAR | Status: AC
  Administered 2020-03-12: 1000 ug via INTRAMUSCULAR

## 2020-03-12 MED ORDER — SODIUM CHLORIDE 0.9 % IV SOLN
510.0000 mg | Freq: Once | INTRAVENOUS | Status: AC
  Administered 2020-03-12: 510 mg via INTRAVENOUS
  Filled 2020-03-12: qty 510

## 2020-03-12 MED ORDER — CYANOCOBALAMIN 1000 MCG/ML IJ SOLN
INTRAMUSCULAR | Status: AC
  Filled 2020-03-12: qty 1

## 2020-03-12 MED ORDER — SODIUM CHLORIDE 0.9 % IV SOLN
Freq: Once | INTRAVENOUS | Status: AC
  Filled 2020-03-12: qty 250

## 2020-03-12 NOTE — Progress Notes (Signed)
Bailey Walker OFFICE PROGRESS NOTE  Bailey Cowman, MD  ASSESSMENT & PLAN:  Iron deficiency anemia She has excellent improvement of her energy level since vitamin B12 replacement therapy and intravenous iron replacement therapy She is still anemic I recommend 1 more dose of intravenous iron today and plan to see her back in 3 months for further follow-up She is in agreement  Vitamin B12 deficiency anemia I recommend B12 injection today and another B12 injection in 3 months I plan to recheck vitamin B12 level in her next visit  Thrombocytosis ([place]) This is resolved It was likely reactive thrombocytosis to iron deficiency anemia  Dyspnea on exertion The cause of her shortness of breath on exertion is multifactorial Her anemia is drastically improved since I saw her I would defer to her primary care doctor for medical management   Orders Placed This Encounter  Procedures  . Vitamin B12    Standing Status:   Standing    Number of Occurrences:   3    Standing Expiration Date:   03/12/2021    The total time spent in the appointment was 20 minutes encounter with patients including review of chart and various tests results, discussions about plan of care and coordination of care plan   All questions were answered. The patient knows to call the clinic with any problems, questions or concerns. No barriers to learning was detected.    Bailey Lark, MD 6/21/20219:44 AM  INTERVAL HISTORY: Bailey Walker 74 y.o. female returns for further follow-up on concurrent iron deficiency and vitamin B12 deficiency She is accompanied by her niece who serves as an interpreter She denies recent chest pain She continues to have mild shortness of breath on exertion The patient denies any recent signs or symptoms of bleeding such as spontaneous epistaxis, hematuria or hematochezia. She denies over-the-counter NSAID use  SUMMARY OF HEMATOLOGIC HISTORY: Bailey Walker is here because  of recent findings of profound anemia She is here accompanied by her niece, Bailey Walker The patient does not speak [place] and Bailey Walker acts as an interpreter The patient has significant smoking history for 45 years, only quit several years ago She has been complaining of profound fatigue and shortness of breath on minimal exertion She denies chest pain no dizziness She has noted intermittent hemorrhoidal bleeding especially when she is constipated The patient has never had screening colonoscopy and refused screening colonoscopy She was found to have profound anemia with recent blood work a week ago on April 26 Her hemoglobin measure less than 7 with severe iron deficiency Her recent blood work also was showed severe vitamin B12 deficiency  The patient denies over the counter NSAID ingestion. She was on on antiplatelets agents.  She had no prior history or diagnosis of cancer.  She denies any pica and eats a variety of diet. She never donated blood or received blood transfusion In May 2021, she was found to have concurrent iron deficiency and vitamin B12 deficiency She received both intravenous iron replacement therapy along with vitamin B12 injection  I have reviewed the past medical history, past surgical history, social history and family history with the patient and they are unchanged from previous note.  ALLERGIES:  is allergic to nitroglycerin and ampicillin.  MEDICATIONS:  Current Outpatient Medications  Medication Sig Dispense Refill  . DEXILANT 30 MG capsule TAKE 1 CAPSULE BY MOUTH EVERY DAY 30 capsule 5  . Dextromethorphan-guaiFENesin 10-100 MG/5ML liquid Take 5 mLs by mouth every 12 (twelve) hours.    Marland Kitchen  glipiZIDE (GLUCOTROL) 5 MG tablet TAKE 1 TABLET BY MOUTH TWICE A DAY BEFORE MEALS 60 tablet 3  . levothyroxine (SYNTHROID) 75 MCG tablet TAKE 1 TABLET BY MOUTH EVERY DAY 30 tablet 0  . metFORMIN (GLUCOPHAGE) 500 MG tablet Take 0.5 tablets (250 mg total) by mouth 2 (two) times daily  with a meal. 90 tablet 3  . metoprolol tartrate (LOPRESSOR) 100 MG tablet Take 1 tablet (100 mg total) by mouth once for 1 dose. Please take one time dose 100mg  metoprolol tartrate 2 hr prior to cardiac CT for HR control IF HR >55bpm. 1 tablet 0  . metoprolol tartrate (LOPRESSOR) 25 MG tablet Take 1 tablet (25 mg total) by mouth 2 (two) times daily. 180 tablet 3  . nitroGLYCERIN (NITROSTAT) 0.4 MG SL tablet Place 1 tablet (0.4 mg total) under the tongue every 5 (five) minutes as needed for chest pain. 11 tablet 6   No current facility-administered medications for this visit.     REVIEW OF SYSTEMS:   Constitutional: Denies fevers, chills or night sweats Eyes: Denies blurriness of vision Ears, nose, mouth, throat, and face: Denies mucositis or sore throat Cardiovascular: Denies palpitation, chest discomfort or lower extremity swelling Gastrointestinal:  Denies nausea, heartburn or change in bowel habits Skin: Denies abnormal skin rashes Lymphatics: Denies new lymphadenopathy or easy bruising Neurological:Denies numbness, tingling or new weaknesses Behavioral/Psych: Mood is stable, no new changes  All other systems were reviewed with the patient and are negative.  PHYSICAL EXAMINATION: ECOG PERFORMANCE STATUS: 1 - Symptomatic but completely ambulatory  Vitals:   03/12/20 0915  BP: (!) 141/76  Pulse: (!) 119  Resp: 18  Temp: 97.8 F (36.6 C)  SpO2: 98%   Filed Weights   03/12/20 0915  Weight: 165 lb 3.2 oz (74.9 kg)    GENERAL:alert, no distress and comfortable NEURO: alert & oriented x 3 with fluent speech, no focal motor/sensory deficits  LABORATORY DATA:  I have reviewed the data as listed     Component Value Date/Time   NA 133 (L) 01/05/2020 0053   NA 139 07/12/2019 1110   K 3.6 01/05/2020 0053   CL 101 01/05/2020 0053   CO2 20 (L) 01/05/2020 0053   GLUCOSE 241 (H) 01/05/2020 0053   BUN 18 01/05/2020 0053   BUN 15 07/12/2019 1110   CREATININE 0.98 01/05/2020 0053    CREATININE 0.74 01/08/2017 1100   CALCIUM 9.1 01/05/2020 0053   PROT 8.1 01/05/2020 0053   PROT 7.1 02/11/2019 1455   ALBUMIN 3.9 01/05/2020 0053   ALBUMIN 4.4 02/11/2019 1455   AST 18 01/05/2020 0053   ALT 12 01/05/2020 0053   ALKPHOS 84 01/05/2020 0053   BILITOT 0.3 01/05/2020 0053   BILITOT <0.2 02/11/2019 1455   GFRNONAA 56 (L) 01/05/2020 0053   GFRNONAA 81 01/08/2017 1100   GFRAA >60 01/05/2020 0053   GFRAA >89 01/08/2017 1100    No results found for: SPEP, UPEP  Lab Results  Component Value Date   WBC 10.3 03/12/2020   NEUTROABS 7.6 03/12/2020   HGB 11.2 (L) 03/12/2020   HCT 35.6 (L) 03/12/2020   MCV 82.4 03/12/2020   PLT 302 03/12/2020      Chemistry      Component Value Date/Time   NA 133 (L) 01/05/2020 0053   NA 139 07/12/2019 1110   K 3.6 01/05/2020 0053   CL 101 01/05/2020 0053   CO2 20 (L) 01/05/2020 0053   BUN 18 01/05/2020 0053   BUN 15 07/12/2019  1110   CREATININE 0.98 01/05/2020 0053   CREATININE 0.74 01/08/2017 1100      Component Value Date/Time   CALCIUM 9.1 01/05/2020 0053   ALKPHOS 84 01/05/2020 0053   AST 18 01/05/2020 0053   ALT 12 01/05/2020 0053   BILITOT 0.3 01/05/2020 0053   BILITOT <0.2 02/11/2019 1455

## 2020-03-12 NOTE — Assessment & Plan Note (Signed)
I recommend B12 injection today and another B12 injection in 3 months I plan to recheck vitamin B12 level in her next visit

## 2020-03-12 NOTE — Assessment & Plan Note (Signed)
This is resolved It was likely reactive thrombocytosis to iron deficiency anemia

## 2020-03-12 NOTE — Patient Instructions (Signed)

## 2020-03-12 NOTE — Assessment & Plan Note (Signed)
She has excellent improvement of her energy level since vitamin B12 replacement therapy and intravenous iron replacement therapy She is still anemic I recommend 1 more dose of intravenous iron today and plan to see her back in 3 months for further follow-up She is in agreement

## 2020-03-12 NOTE — Assessment & Plan Note (Signed)
The cause of her shortness of breath on exertion is multifactorial Her anemia is drastically improved since I saw her I would defer to her primary care doctor for medical management

## 2020-03-13 ENCOUNTER — Telehealth: Payer: Self-pay | Admitting: Hematology and Oncology

## 2020-03-13 NOTE — Telephone Encounter (Signed)
Scheduled per 6/21 sch msg. No answer/ no VM, mailing letter and calendar printout

## 2020-03-25 ENCOUNTER — Other Ambulatory Visit: Payer: Self-pay

## 2020-03-25 ENCOUNTER — Emergency Department (HOSPITAL_BASED_OUTPATIENT_CLINIC_OR_DEPARTMENT_OTHER): Payer: Medicaid Other

## 2020-03-25 ENCOUNTER — Emergency Department (HOSPITAL_BASED_OUTPATIENT_CLINIC_OR_DEPARTMENT_OTHER)
Admission: EM | Admit: 2020-03-25 | Discharge: 2020-03-25 | Disposition: A | Discharge status: Home / Self Care | Payer: Medicaid Other | Attending: Emergency Medicine | Admitting: Emergency Medicine

## 2020-03-25 ENCOUNTER — Encounter (HOSPITAL_BASED_OUTPATIENT_CLINIC_OR_DEPARTMENT_OTHER): Payer: Self-pay

## 2020-03-25 DIAGNOSIS — E119 Type 2 diabetes mellitus without complications: Secondary | ICD-10-CM | POA: Diagnosis not present

## 2020-03-25 DIAGNOSIS — R112 Nausea with vomiting, unspecified: Secondary | ICD-10-CM | POA: Diagnosis not present

## 2020-03-25 DIAGNOSIS — E039 Hypothyroidism, unspecified: Secondary | ICD-10-CM | POA: Insufficient documentation

## 2020-03-25 DIAGNOSIS — Z7984 Long term (current) use of oral hypoglycemic drugs: Secondary | ICD-10-CM | POA: Diagnosis not present

## 2020-03-25 DIAGNOSIS — R140 Abdominal distension (gaseous): Secondary | ICD-10-CM

## 2020-03-25 DIAGNOSIS — R109 Unspecified abdominal pain: Secondary | ICD-10-CM | POA: Insufficient documentation

## 2020-03-25 DIAGNOSIS — I10 Essential (primary) hypertension: Secondary | ICD-10-CM | POA: Insufficient documentation

## 2020-03-25 DIAGNOSIS — Z79890 Hormone replacement therapy (postmenopausal): Secondary | ICD-10-CM | POA: Insufficient documentation

## 2020-03-25 DIAGNOSIS — Z87891 Personal history of nicotine dependence: Secondary | ICD-10-CM | POA: Diagnosis not present

## 2020-03-25 DIAGNOSIS — Z79899 Other long term (current) drug therapy: Secondary | ICD-10-CM | POA: Diagnosis not present

## 2020-03-25 LAB — CBC WITH DIFFERENTIAL/PLATELET
Abs Immature Granulocytes: 0.08 10*3/uL — ABNORMAL HIGH (ref 0.00–0.07)
Basophils Absolute: 0.1 10*3/uL (ref 0.0–0.1)
Basophils Relative: 1 %
Eosinophils Absolute: 0 10*3/uL (ref 0.0–0.5)
Eosinophils Relative: 0 %
HCT: 44 % (ref 36.0–46.0)
Hemoglobin: 13.7 g/dL (ref 12.0–15.0)
Immature Granulocytes: 1 %
Lymphocytes Relative: 17 %
Lymphs Abs: 2.5 10*3/uL (ref 0.7–4.0)
MCH: 26.9 pg (ref 26.0–34.0)
MCHC: 31.1 g/dL (ref 30.0–36.0)
MCV: 86.4 fL (ref 80.0–100.0)
Monocytes Absolute: 0.9 10*3/uL (ref 0.1–1.0)
Monocytes Relative: 6 %
Neutro Abs: 11.1 10*3/uL — ABNORMAL HIGH (ref 1.7–7.7)
Neutrophils Relative %: 75 %
Platelets: 422 10*3/uL — ABNORMAL HIGH (ref 150–400)
RBC: 5.09 MIL/uL (ref 3.87–5.11)
RDW: 24.9 % — ABNORMAL HIGH (ref 11.5–15.5)
Smear Review: NORMAL
WBC: 14.6 10*3/uL — ABNORMAL HIGH (ref 4.0–10.5)
nRBC: 0 % (ref 0.0–0.2)

## 2020-03-25 LAB — URINALYSIS, ROUTINE W REFLEX MICROSCOPIC
Bilirubin Urine: NEGATIVE
Glucose, UA: NEGATIVE mg/dL
Hgb urine dipstick: NEGATIVE
Ketones, ur: NEGATIVE mg/dL
Nitrite: NEGATIVE
Protein, ur: NEGATIVE mg/dL
Specific Gravity, Urine: <1.005 — ABNORMAL LOW (ref 1.005–1.030)
pH: 6 (ref 5.0–8.0)

## 2020-03-25 LAB — COMPREHENSIVE METABOLIC PANEL
ALT: 11 U/L (ref 0–44)
AST: 19 U/L (ref 15–41)
Albumin: 4.5 g/dL (ref 3.5–5.0)
Alkaline Phosphatase: 80 U/L (ref 38–126)
Anion gap: 16 — ABNORMAL HIGH (ref 5–15)
BUN: 12 mg/dL (ref 8–23)
CO2: 17 mmol/L — ABNORMAL LOW (ref 22–32)
Calcium: 8.5 mg/dL — ABNORMAL LOW (ref 8.9–10.3)
Chloride: 102 mmol/L (ref 98–111)
Creatinine, Ser: 0.82 mg/dL (ref 0.44–1.00)
GFR calc Af Amer: >60 mL/min (ref >60)
GFR calc non Af Amer: >60 mL/min (ref >60)
Glucose, Bld: 190 mg/dL — ABNORMAL HIGH (ref 70–99)
Potassium: 4.6 mmol/L (ref 3.5–5.1)
Sodium: 135 mmol/L (ref 135–145)
Total Bilirubin: 0.7 mg/dL (ref 0.3–1.2)
Total Protein: 8.8 g/dL — ABNORMAL HIGH (ref 6.5–8.1)

## 2020-03-25 LAB — URINALYSIS, MICROSCOPIC (REFLEX): RBC / HPF: NONE SEEN RBC/hpf (ref 0–5)

## 2020-03-25 LAB — LIPASE, BLOOD: Lipase: 18 U/L (ref 11–51)

## 2020-03-25 LAB — CBG MONITORING, ED: Glucose-Capillary: 188 mg/dL — ABNORMAL HIGH (ref 70–99)

## 2020-03-25 MED ORDER — PROMETHAZINE HCL 12.5 MG RE SUPP
25.0000 mg | Freq: Four times a day (QID) | RECTAL | 0 refills | Status: DC | PRN
Start: 2020-03-25 — End: 2020-05-02

## 2020-03-25 MED ORDER — SODIUM CHLORIDE 0.9 % IV BOLUS
1000.0000 mL | Freq: Once | INTRAVENOUS | Status: AC
  Administered 2020-03-25: 1000 mL via INTRAVENOUS

## 2020-03-25 MED ORDER — IOHEXOL 300 MG/ML  SOLN
100.0000 mL | Freq: Once | INTRAMUSCULAR | Status: AC | PRN
  Administered 2020-03-25: 100 mL via INTRAVENOUS

## 2020-03-25 MED ORDER — ONDANSETRON HCL 4 MG/2ML IJ SOLN
4.0000 mg | Freq: Once | INTRAMUSCULAR | Status: AC
  Administered 2020-03-25: 4 mg via INTRAVENOUS
  Filled 2020-03-25: qty 2

## 2020-03-25 MED ORDER — ONDANSETRON HCL 4 MG/2ML IJ SOLN: 4.0000 mg | Freq: Once | INTRAMUSCULAR | Status: AC

## 2020-03-25 MED ORDER — MORPHINE SULFATE (PF) 4 MG/ML IV SOLN
4.0000 mg | Freq: Once | INTRAVENOUS | Status: AC
  Administered 2020-03-25: 4 mg via INTRAVENOUS
  Filled 2020-03-25: qty 1

## 2020-03-25 MED ORDER — ONDANSETRON HCL 4 MG/2ML IJ SOLN
INTRAMUSCULAR | Status: AC
  Administered 2020-03-25: 4 mg via INTRAVENOUS
  Filled 2020-03-25: qty 2

## 2020-03-25 MED ORDER — ONDANSETRON HCL 4 MG PO TABS
4.0000 mg | ORAL_TABLET | Freq: Three times a day (TID) | ORAL | 0 refills | Status: DC | PRN
Start: 2020-03-25 — End: 2020-05-02

## 2020-03-25 NOTE — ED Provider Notes (Signed)
[facility] EMERGENCY DEPARTMENT Provider Note   CSN: [id number] Arrival date & time: 03/25/20  1733     History Chief Complaint  Patient presents with  . Emesis    Bailey Walker is a 74 y.o. female.  She speaks Arabic and she does not want to use the iPad interpreter, but instead a  family member.  She has had diffuse abdominal pain and 8-10 episodes of vomiting since 5 PM yesterday.  No sick contacts or recent travel.  No other family members with similar symptoms.  She has not noticed any blood from above or below.  No diarrhea.  No urinary symptoms.  No fevers or chills.  It sounds like she had abdominal pain in the past but different than this and her doctor has wanted her to get some imaging.  History of diabetes hypertension thyroid disease.  Prior surgical history of appendectomy.  The history is provided by the patient and a relative. The history is limited by a language barrier. A language interpreter was used (family).  Emesis Severity:  Moderate Duration:  24 hours Timing:  Intermittent Number of daily episodes:  10 Quality:  Unable to specify Progression:  Unchanged Chronicity:  New Recent urination:  Normal Relieved by:  Nothing Worsened by:  Nothing Ineffective treatments:  None tried Associated symptoms: abdominal pain   Associated symptoms: no chills, no cough, no diarrhea, no fever, no headaches and no sore throat   Abdominal pain:    Location:  Generalized   Quality: bloating and cramping     Severity:  Moderate   Onset quality:  Sudden   Duration:  24 hours   Timing:  Constant   Progression:  Unchanged   Chronicity:  New Risk factors: diabetes and prior abdominal surgery   Risk factors: no sick contacts and no suspect food intake        Past Medical History:  Diagnosis Date  . Diabetes mellitus without complication ([place])   . Hypertension   . SVT (supraventricular tachycardia) ([place])   . Thyroid disease     Patient Active Problem  List   Diagnosis Date Noted  . Thrombocytosis ([place]) 01/27/2020  . Dyspnea on exertion 01/27/2020  . Iron deficiency anemia 01/26/2020  . Vitamin B12 deficiency anemia 01/26/2020  . 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 Fuchs' corneal dystrophy 09/20/2018  . 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 Diabetes mellitus due to underlying condition with unspecified complications ([place]) 47/42/5956  . Ex-smoker 08/26/2017  . Atypical chest pain 08/26/2017  . Essential hypertension 01/11/2017  . Hypothyroidism 01/08/2017  . SVT (supraventricular tachycardia) ([place]) 01/08/2017    Past Surgical History:  Procedure Laterality Date  . APPENDECTOMY    . BREAST SURGERY    . EYE SURGERY Bilateral    april and march 2019 for cataracts.   . fatty gland     left wrist , right breast  . KNEE SURGERY     left knee     OB History   No obstetric history on file.     Family History  Problem Relation Age of Onset  . Breast cancer Cousin        mat and pat sides  . Cancer Sister        cancer everywhere  . Colon cancer Neg Hx   . Esophageal cancer Neg Hx   . Rectal cancer Neg Hx  Social History   Tobacco Use  . Smoking status: Former Smoker    Quit date: 12/29/2013    Years since quitting: 6.2  . Smokeless tobacco: Never Used  Vaping Use  . Vaping Use: Never used  Substance Use Topics  . Alcohol use: No  . Drug use: No    Home Medications Prior to Admission medications   Medication Sig Start Date End Date Taking? Authorizing Provider  DEXILANT 30 MG capsule TAKE 1 CAPSULE BY MOUTH EVERY DAY 10/20/19   Tresa Garter, MD  Dextromethorphan-guaiFENesin 10-100 MG/5ML liquid Take 5 mLs by mouth every 12 (twelve) hours.    [provider]  glipiZIDE (GLUCOTROL) 5 MG tablet TAKE 1 TABLET BY MOUTH TWICE A DAY BEFORE MEALS 11/04/19   Dorena Dew, FNP    levothyroxine (SYNTHROID) 75 MCG tablet TAKE 1 TABLET BY MOUTH EVERY DAY 01/02/20   Dorena Dew, FNP  metFORMIN (GLUCOPHAGE) 500 MG tablet Take 0.5 tablets (250 mg total) by mouth 2 (two) times daily with a meal. 05/16/19   [name], FNP  metoprolol tartrate (LOPRESSOR) 100 MG tablet Take 1 tablet (100 mg total) by mouth once for 1 dose. Please take one time dose 100mg  metoprolol tartrate 2 hr prior to cardiac CT for HR control IF HR >55bpm. 01/30/20 01/30/20  Lorretta Harp, MD  metoprolol tartrate (LOPRESSOR) 25 MG tablet Take 1 tablet (25 mg total) by mouth 2 (two) times daily. 08/02/19   Tresa Garter, MD  nitroGLYCERIN (NITROSTAT) 0.4 MG SL tablet Place 1 tablet (0.4 mg total) under the tongue every 5 (five) minutes as needed for chest pain. 05/16/19 08/14/19  [name], FNP    Allergies    Nitroglycerin and Ampicillin  Review of Systems   Review of Systems  Constitutional: Negative for chills and fever.  HENT: Negative for sore throat.   Eyes: Negative for pain.  Respiratory: Negative for cough and shortness of breath.   Cardiovascular: Negative for chest pain.  Gastrointestinal: Positive for abdominal pain, nausea and vomiting. Negative for diarrhea.  Genitourinary: Negative for dysuria.  Musculoskeletal: Negative for neck pain.  Skin: Negative for rash.  Neurological: Negative for headaches.    Physical Exam Updated Vital Signs BP 137/77   Pulse (!) 138   Resp 20   Wt 75.1 kg   SpO2 98%   BMI 31.27 kg/m   Physical Exam Vitals and nursing note reviewed.  Constitutional:      General: She is not in acute distress.    Appearance: Normal appearance. She is well-developed.  HENT:     Head: Normocephalic and atraumatic.  Eyes:     Conjunctiva/sclera: Conjunctivae normal.  Cardiovascular:     Rate and Rhythm: Regular rhythm. Tachycardia present.     Heart sounds: No murmur heard.   Pulmonary:     Effort: Pulmonary effort is normal. No  respiratory distress.     Breath sounds: Normal breath sounds.  Abdominal:     Palpations: Abdomen is soft.     Tenderness: There is abdominal tenderness (generalized). There is no guarding or rebound.  Musculoskeletal:        General: No deformity or signs of injury. Normal range of motion.     Cervical back: Neck supple.  Skin:    General: Skin is warm and dry.     Capillary Refill: Capillary refill takes less than 2 seconds.  Neurological:     General: No focal deficit present.     Mental  Status: She is alert. Mental status is at baseline.     ED Results / Procedures / Treatments   Labs (all labs ordered are listed, but only abnormal results are displayed) Labs Reviewed  CBC WITH DIFFERENTIAL/PLATELET - Abnormal; Notable for the following components:      Result Value   WBC 14.6 (*)    RDW 24.9 (*)    Platelets 422 (*)    Neutro Abs 11.1 (*)    Abs Immature Granulocytes 0.08 (*)    All other components within normal limits  COMPREHENSIVE METABOLIC PANEL - Abnormal; Notable for the following components:   CO2 17 (*)    Glucose, Bld 190 (*)    Calcium 8.5 (*)    Total Protein 8.8 (*)    Anion gap 16 (*)    All other components within normal limits  URINALYSIS, ROUTINE W REFLEX MICROSCOPIC - Abnormal; Notable for the following components:   Specific Gravity, Urine <1.005 (*)    Leukocytes,Ua SMALL (*)    All other components within normal limits  URINALYSIS, MICROSCOPIC (REFLEX) - Abnormal; Notable for the following components:   Bacteria, UA RARE (*)    All other components within normal limits  CBG MONITORING, ED - Abnormal; Notable for the following components:   Glucose-Capillary 188 (*)    All other components within normal limits  LIPASE, BLOOD    EKG None  Radiology CT Abdomen Pelvis W Contrast  Result Date: 03/25/2020 CLINICAL DATA:  Abdominal distension, unable to tolerate solid or liquid diet. EXAM: CT ABDOMEN AND PELVIS WITH CONTRAST TECHNIQUE:  Multidetector CT imaging of the abdomen and pelvis was performed using the standard protocol following bolus administration of intravenous contrast. CONTRAST:  168mL OMNIPAQUE IOHEXOL 300 MG/ML  SOLN COMPARISON:  CT 08/12/2017 FINDINGS: Lower chest: Dependent and subsegmental atelectatic changes in the lung bases. Otherwise clear. Cardiac size at the upper limits of normal. No pericardial effusion. Coronary artery calcifications are present. Hepatobiliary: Diffuse mild hepatic hypoattenuation may reflect some mild hepatic steatosis. Smooth surface contour. Smooth blush of contrast again seen in the posterior right lobe liver (2/34) normalizing on delayed phase imaging may reflect a transient hepatic attenuation difference or flash filling hemangioma. No concerning liver lesions. Normal gallbladder and biliary tree without calcified gallstones. Pancreas: Partial fatty replacement of the pancreas. No pancreatic ductal dilatation or surrounding inflammatory changes. Spleen: Normal in size without focal abnormality. Adrenals/Urinary Tract: Normal adrenal glands. Kidneys enhance and excrete create symmetrically. No concerning renal lesions. No urolithiasis or hydronephrosis. Urinary bladder is largely decompressed at the time of exam and therefore poorly evaluated by CT imaging. No gross bladder abnormality. Stomach/Bowel: Moderate hiatal hernia, similar in size and appearance to comparison. Prominent rugal folds of the stomach. Mild thickening towards the antrum likely related to contraction. Fluid-filled appearance of the more mid to distal small bowel without evidence of frank distention or evidence of high-grade obstruction. Some minimal mural thickening may be present towards the ilium as well. The colon is also diffusely fluid-filled. Mild edematous mural thickening of the colon is present more pronounced at the rectosigmoid. The appendix is surgically absent. Vascular/Lymphatic: Atherosclerotic calcifications  within the abdominal aorta and branch vessels. No aneurysm or ectasia. No enlarged abdominopelvic lymph nodes. Reactive appearing nonenlarged nodes present in the mesentery. Reproductive: Retroflexed, fibroid uterus. Stable coarse calcification of the left ovary. Concerning adnexal lesions are seen. Other: Small volume of fluid seen within the anterior abdomen adjacent several of the fluid-filled loops of bowel, favored to be  reactive. No abdominopelvic free air. No bowel containing hernias. Musculoskeletal: Dextrocurvature of the lumbar spine, apex L3. Multilevel degenerative changes are present in the imaged portions of the spine. Additional degenerative changes in the hips and pelvis. No acute osseous abnormality or suspicious osseous lesion. IMPRESSION: 1. Fluid-filled appearance of the small bowel without frank distention or evidence of obstruction. Additional fluid-filled and mildly edematous colon. Findings may reflect an enterocolitis, which could be infectious or inflammatory in etiology. 2. Small volume of fluid in the anterior mesentery, likely reactive. 3. Moderate hiatal hernia, similar in size and appearance to comparison to comparison. 4. Thickened rugal folds, could correlate for clinical symptoms of gastritis. 5. Retroflexed, fibroid uterus. 6. Aortic Atherosclerosis (ICD10-I70.0). Electronically Signed   By: Lovena Le M.D.   On: 03/25/2020 19:23    Procedures Procedures (including critical care time)  Medications Ordered in ED Medications  ondansetron (ZOFRAN) injection 4 mg (4 mg Intravenous Given 03/25/20 1758)  morphine 4 MG/ML injection 4 mg (4 mg Intravenous Given 03/25/20 1810)  sodium chloride 0.9 % bolus 1,000 mL (0 mLs Intravenous Stopped 03/25/20 1946)  iohexol (OMNIPAQUE) 300 MG/ML solution 100 mL (100 mLs Intravenous Contrast Given 03/25/20 1858)  ondansetron (ZOFRAN) injection 4 mg (4 mg Intravenous Given 03/25/20 2052)    ED Course  I have reviewed the triage vital signs and  the nursing notes.  Pertinent labs & imaging results that were available during my care of the patient were reviewed by me and considered in my medical decision making (see chart for details).  Clinical Course as of Mar 27 1119  Sun Mar 25, 2020  1915 EKG not crossing in epic.  Sinus tachycardia rate of 132 nonspecific ST changes likely rate related.   [MB]  2044 P.o. trial-patient drank about half of a cup of ginger ale, vomited.  Will reorder some nausea medication and try again.   [MB]  2110 Patient states her pain is decreased from an 8 out of 10 to a 2 out of 10.   [MB]  2143 I had extensive conversation with the patient via family member translating.  She was offered admission for symptom control versus home trial on oral and suppository nausea medication.  She elects to go home.  Return instructions given.   [MB]    Clinical Course User Index [MB] Hayden Rasmussen, MD   MDM Rules/Calculators/A&P                         This patient complains of abdominal pain and vomiting; this involves an extensive number of treatment Options and is a complaint that carries with it a high risk of complications and Morbidity. The differential includes obstruction, peptic ulcer disease, cholelithiasis, cholecystitis, diverticulitis, enteritis, pyelonephritis  I ordered, reviewed and interpreted labs, which included CBC with elevated white count, normal hemoglobin, elevated platelets likely reactive, chemistries with elevated glucose, low bicarb reflecting some level of dehydration, urinalysis without signs of infection, normal LFTs and lipase I ordered medication IV fluids nausea and pain medicine with improvement in her symptoms I ordered imaging studies which included CT abdomen pelvis and I independently    visualized and interpreted imaging which showed fluid-filled large and small bowel, no obstruction Additional history obtained from patient's family member Previous records obtained and  reviewed in epic, no recent visits for same  After the interventions stated above, I reevaluated the patient and found patient to be hemodynamically stable with her tachycardia resolved.  Abdominal  exam benign.  She is try to take some fluids orally here with recurrence in her nausea and some small amount of vomit.  Offered admission for further symptomatic management versus trial of home with prescriptions for antiemetics.  They elect to go home.  Return instructions discussed.   Final Clinical Impression(s) / ED Diagnoses Final diagnoses:  Non-intractable vomiting with nausea, unspecified vomiting type  Abdominal distention    Rx / DC Orders ED Discharge Orders         Ordered    ondansetron (ZOFRAN) 4 MG tablet  Every 8 hours PRN     Discontinue  Reprint     03/25/20 2143    promethazine (PHENERGAN) 12.5 MG suppository  Every 6 hours PRN     Discontinue  Reprint     03/25/20 2143           Hayden Rasmussen, MD 03/26/20 1123

## 2020-03-25 NOTE — Discharge Instructions (Addendum)
You were seen in the emergency department for nausea vomiting and abdominal pain.  You had blood work that showed your infection cell count was mildly elevated and your CAT scan showed fluid filled and dilated bowel.  This may be related to a viral infection that causes vomiting and diarrhea.  Please use a clear liquid diet and keep well-hydrated.  We are prescribing with 2 different nausea medications to try.  Follow-up with your doctor.  Return to the emergency department for any fever or worsening symptoms.

## 2020-03-25 NOTE — ED Triage Notes (Addendum)
Pt presents with emesis and abd pain since yesterday. Pt has been unable to hold down fluids. Pt has vomited 8-10 times in last 24hrs.   Pt speak arabic and niece is with patient to translate. Attempted to use translator, but pt requested to use family member.

## 2020-03-25 NOTE — ED Notes (Signed)
Reminded of needing u/a, unable to provide sample at this time

## 2020-03-25 NOTE — ED Notes (Signed)
ED Provider at bedside. 

## 2020-04-02 ENCOUNTER — Encounter: Payer: Self-pay | Admitting: Podiatry

## 2020-04-02 ENCOUNTER — Other Ambulatory Visit: Payer: Self-pay

## 2020-04-02 ENCOUNTER — Ambulatory Visit: Payer: Medicaid Other | Admitting: Podiatry

## 2020-04-02 DIAGNOSIS — M79674 Pain in right toe(s): Secondary | ICD-10-CM

## 2020-04-02 DIAGNOSIS — E1142 Type 2 diabetes mellitus with diabetic polyneuropathy: Secondary | ICD-10-CM

## 2020-04-02 DIAGNOSIS — B351 Tinea unguium: Secondary | ICD-10-CM | POA: Diagnosis not present

## 2020-04-02 DIAGNOSIS — M79675 Pain in left toe(s): Secondary | ICD-10-CM

## 2020-04-02 DIAGNOSIS — G5793 Unspecified mononeuropathy of bilateral lower limbs: Secondary | ICD-10-CM

## 2020-04-02 NOTE — Progress Notes (Signed)
Subjective:  Patient ID: Bailey Walker, female    DOB: 27-Oct-1945,  MRN: [id number]  74 y.o. female presents with preventative diabetic foot care and painful thick toenails that are difficult to trim. Pain interferes with ambulation. Aggravating factors include wearing enclosed shoe gear. Pain is relieved with periodic professional debridement..    Review of Systems: Negative except as noted in the HPI. Denies N/V/F/Ch. Past Medical History:  Diagnosis Date  . Diabetes mellitus without complication ([place])   . Hypertension   . SVT (supraventricular tachycardia) ([place])   . Thyroid disease    Past Surgical History:  Procedure Laterality Date  . APPENDECTOMY    . BREAST SURGERY    . EYE SURGERY Bilateral    april and march 2019 for cataracts.   . fatty gland     left wrist , right breast  . KNEE SURGERY     left knee   Patient Active Problem List   Diagnosis Date Noted  . Thrombocytosis ([place]) 01/27/2020  . Dyspnea on exertion 01/27/2020  . Iron deficiency anemia 01/26/2020  . Vitamin B12 deficiency anemia 01/26/2020  . 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 Fuchs' corneal dystrophy 09/20/2018  . 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 Diabetes mellitus due to underlying condition with unspecified complications ([place]) 18/56/3149  . Ex-smoker 08/26/2017  . Atypical chest pain 08/26/2017  . Essential hypertension 01/11/2017  . Hypothyroidism 01/08/2017  . SVT (supraventricular tachycardia) ([place]) 01/08/2017    Current Outpatient Medications:  .  alendronate (FOSAMAX) 70 MG tablet, Take 70 mg by mouth once a week., Disp: , Rfl:  .  ASPIRIN LOW DOSE 81 MG EC tablet, Take 81 mg by mouth daily., Disp: , Rfl:  .  DEXILANT 30 MG capsule, TAKE 1 CAPSULE BY MOUTH EVERY DAY, Disp: 30 capsule, Rfl: 5 .  Dextromethorphan-guaiFENesin 10-100 MG/5ML liquid, Take 5 mLs by mouth  every 12 (twelve) hours., Disp: , Rfl:  .  ferrous gluconate (FERGON) 324 MG tablet, Take 648 mg by mouth daily., Disp: , Rfl:  .  glipiZIDE (GLUCOTROL) 5 MG tablet, TAKE 1 TABLET BY MOUTH TWICE A DAY BEFORE MEALS, Disp: 60 tablet, Rfl: 3 .  levothyroxine (SYNTHROID) 75 MCG tablet, TAKE 1 TABLET BY MOUTH EVERY DAY, Disp: 30 tablet, Rfl: 0 .  metFORMIN (GLUCOPHAGE) 500 MG tablet, Take 0.5 tablets (250 mg total) by mouth 2 (two) times daily with a meal., Disp: 90 tablet, Rfl: 3 .  metoprolol tartrate (LOPRESSOR) 100 MG tablet, Take 1 tablet (100 mg total) by mouth once for 1 dose. Please take one time dose 100mg  metoprolol tartrate 2 hr prior to cardiac CT for HR control IF HR >55bpm., Disp: 1 tablet, Rfl: 0 .  metoprolol tartrate (LOPRESSOR) 25 MG tablet, Take 1 tablet (25 mg total) by mouth 2 (two) times daily., Disp: 180 tablet, Rfl: 3 .  nitroGLYCERIN (NITROSTAT) 0.4 MG SL tablet, Place 1 tablet (0.4 mg total) under the tongue every 5 (five) minutes as needed for chest pain., Disp: 11 tablet, Rfl: 6 .  ondansetron (ZOFRAN) 4 MG tablet, Take 1 tablet (4 mg total) by mouth every 8 (eight) hours as needed for nausea or vomiting., Disp: 20 tablet, Rfl: 0 .  PROLIA 60 MG/ML SOSY injection, Inject into the skin., Disp: , Rfl:  .  promethazine (PHENERGAN) 12.5 MG suppository, Place 2 suppositories (25 mg total) rectally every 6 (six) hours as needed  for nausea or vomiting., Disp: 20 suppository, Rfl: 0 .  Vitamin D, Ergocalciferol, (DRISDOL) 1.25 MG (50000 UNIT) CAPS capsule, Take 50,000 Units by mouth once a week., Disp: , Rfl:  Allergies  Allergen Reactions  . Nitroglycerin Nausea And Vomiting  . Ampicillin Other (See Comments)    Per allergy test   Social History   Tobacco Use  Smoking Status Former Smoker  . Quit date: 12/29/2013  . Years since quitting: 6.2  Smokeless Tobacco Never Used   Objective:  There were no vitals filed for this visit. Constitutional Patient is a pleasant 74 y.o.   female, in NAD.Marland Kitchen  Vascular Neurovascular status unchanged b/l lower extremities. Capillary fill time to digits <3 seconds b/l lower extremities. Palpable pedal pulses b/l LE. Pedal hair sparse. Lower extremity skin temperature gradient within normal limits. No edema noted b/l lower extremities. Capillary refill normal to all digits.  No cyanosis or clubbing noted.  Neurologic Normal speech. Oriented to person, place, and time. Pt has subjective symptoms of neuropathy. Protective sensation intact 5/5 intact bilaterally with 10g monofilament b/l. Vibratory sensation intact b/l. Clonus negative b/l.  Dermatologic Pedal skin with normal turgor, texture and tone bilaterally. No open wounds bilaterally. No interdigital macerations bilaterally. Toenails 1-5 b/l elongated, discolored, dystrophic, thickened, crumbly with subungual debris and tenderness to dorsal palpation.  Orthopedic: Normal muscle strength 5/5 to all lower extremity muscle groups bilaterally. No pain crepitus or joint limitation noted with ROM b/l. No gross bony deformities bilaterally. Patient ambulates independent of any assistive aids.   Radiographs: None Assessment:   1. Pain due to onychomycosis of toenails of both feet   2. Neuropathic pain of both feet   3. Diabetic peripheral neuropathy associated with type 2 diabetes mellitus ([place])    Plan:  Patient was evaluated and treated and all questions answered.  Onychomycosis with pain -Nails palliatively debridement as below. -Educated on self-care  Procedure: Nail Debridement Rationale: Pain Type of Debridement: manual, sharp debridement. Instrumentation: Nail nipper, rotary burr. Number of Nails: 10  -No new findings. No new orders. -Toenails 1-5 b/l were debrided in length and girth with sterile nail nippers and dremel without iatrogenic bleeding.  -Patient to report any pedal injuries to medical professional immediately. -Patient to continue soft, supportive shoe gear  daily. -Patient/POA to call should there be question/concern in the interim.  Return in about 3 months (around 07/03/2020) for diabetic nail trim.  Marzetta Board, DPM

## 2020-04-15 ENCOUNTER — Other Ambulatory Visit: Payer: Self-pay

## 2020-04-15 ENCOUNTER — Inpatient Hospital Stay (HOSPITAL_BASED_OUTPATIENT_CLINIC_OR_DEPARTMENT_OTHER)
Admission: EM | Admit: 2020-04-15 | Discharge: 2020-05-02 | DRG: 330 | Disposition: A | Discharge status: Home Health | Payer: Medicaid Other | Attending: Internal Medicine | Admitting: Internal Medicine

## 2020-04-15 DIAGNOSIS — Z7189 Other specified counseling: Secondary | ICD-10-CM

## 2020-04-15 DIAGNOSIS — N390 Urinary tract infection, site not specified: Secondary | ICD-10-CM | POA: Diagnosis present

## 2020-04-15 DIAGNOSIS — K648 Other hemorrhoids: Secondary | ICD-10-CM | POA: Diagnosis present

## 2020-04-15 DIAGNOSIS — E860 Dehydration: Secondary | ICD-10-CM

## 2020-04-15 DIAGNOSIS — E119 Type 2 diabetes mellitus without complications: Secondary | ICD-10-CM | POA: Diagnosis present

## 2020-04-15 DIAGNOSIS — R933 Abnormal findings on diagnostic imaging of other parts of digestive tract: Secondary | ICD-10-CM

## 2020-04-15 DIAGNOSIS — D509 Iron deficiency anemia, unspecified: Secondary | ICD-10-CM | POA: Diagnosis present

## 2020-04-15 DIAGNOSIS — R188 Other ascites: Secondary | ICD-10-CM | POA: Diagnosis present

## 2020-04-15 DIAGNOSIS — K56609 Unspecified intestinal obstruction, unspecified as to partial versus complete obstruction: Secondary | ICD-10-CM | POA: Diagnosis present

## 2020-04-15 DIAGNOSIS — Z0189 Encounter for other specified special examinations: Secondary | ICD-10-CM

## 2020-04-15 DIAGNOSIS — K317 Polyp of stomach and duodenum: Secondary | ICD-10-CM | POA: Diagnosis present

## 2020-04-15 DIAGNOSIS — R140 Abdominal distension (gaseous): Secondary | ICD-10-CM

## 2020-04-15 DIAGNOSIS — E1136 Type 2 diabetes mellitus with diabetic cataract: Secondary | ICD-10-CM | POA: Diagnosis present

## 2020-04-15 DIAGNOSIS — E669 Obesity, unspecified: Secondary | ICD-10-CM | POA: Diagnosis present

## 2020-04-15 DIAGNOSIS — Z79890 Hormone replacement therapy (postmenopausal): Secondary | ICD-10-CM

## 2020-04-15 DIAGNOSIS — I10 Essential (primary) hypertension: Secondary | ICD-10-CM | POA: Diagnosis present

## 2020-04-15 DIAGNOSIS — Z7982 Long term (current) use of aspirin: Secondary | ICD-10-CM

## 2020-04-15 DIAGNOSIS — T8131XA Disruption of external operation (surgical) wound, not elsewhere classified, initial encounter: Secondary | ICD-10-CM | POA: Diagnosis present

## 2020-04-15 DIAGNOSIS — C19 Malignant neoplasm of rectosigmoid junction: Principal | ICD-10-CM | POA: Diagnosis present

## 2020-04-15 DIAGNOSIS — L03311 Cellulitis of abdominal wall: Secondary | ICD-10-CM | POA: Diagnosis not present

## 2020-04-15 DIAGNOSIS — C772 Secondary and unspecified malignant neoplasm of intra-abdominal lymph nodes: Secondary | ICD-10-CM

## 2020-04-15 DIAGNOSIS — E039 Hypothyroidism, unspecified: Secondary | ICD-10-CM | POA: Diagnosis present

## 2020-04-15 DIAGNOSIS — Z87891 Personal history of nicotine dependence: Secondary | ICD-10-CM

## 2020-04-15 DIAGNOSIS — D630 Anemia in neoplastic disease: Secondary | ICD-10-CM | POA: Diagnosis present

## 2020-04-15 DIAGNOSIS — D62 Acute posthemorrhagic anemia: Secondary | ICD-10-CM | POA: Diagnosis not present

## 2020-04-15 DIAGNOSIS — K2950 Unspecified chronic gastritis without bleeding: Secondary | ICD-10-CM | POA: Diagnosis present

## 2020-04-15 DIAGNOSIS — K2970 Gastritis, unspecified, without bleeding: Secondary | ICD-10-CM

## 2020-04-15 DIAGNOSIS — T8142XA Infection following a procedure, deep incisional surgical site, initial encounter: Secondary | ICD-10-CM | POA: Diagnosis not present

## 2020-04-15 DIAGNOSIS — E876 Hypokalemia: Secondary | ICD-10-CM | POA: Diagnosis not present

## 2020-04-15 DIAGNOSIS — L7632 Postprocedural hematoma of skin and subcutaneous tissue following other procedure: Secondary | ICD-10-CM | POA: Diagnosis not present

## 2020-04-15 DIAGNOSIS — C187 Malignant neoplasm of sigmoid colon: Secondary | ICD-10-CM

## 2020-04-15 DIAGNOSIS — K641 Second degree hemorrhoids: Secondary | ICD-10-CM | POA: Diagnosis present

## 2020-04-15 DIAGNOSIS — M810 Age-related osteoporosis without current pathological fracture: Secondary | ICD-10-CM | POA: Diagnosis present

## 2020-04-15 DIAGNOSIS — Z79899 Other long term (current) drug therapy: Secondary | ICD-10-CM

## 2020-04-15 DIAGNOSIS — E538 Deficiency of other specified B group vitamins: Secondary | ICD-10-CM | POA: Diagnosis present

## 2020-04-15 DIAGNOSIS — K2990 Gastroduodenitis, unspecified, without bleeding: Secondary | ICD-10-CM | POA: Diagnosis present

## 2020-04-15 DIAGNOSIS — R1084 Generalized abdominal pain: Secondary | ICD-10-CM

## 2020-04-15 DIAGNOSIS — E088 Diabetes mellitus due to underlying condition with unspecified complications: Secondary | ICD-10-CM | POA: Diagnosis present

## 2020-04-15 DIAGNOSIS — K6389 Other specified diseases of intestine: Secondary | ICD-10-CM

## 2020-04-15 DIAGNOSIS — I471 Supraventricular tachycardia: Secondary | ICD-10-CM | POA: Diagnosis present

## 2020-04-15 DIAGNOSIS — N898 Other specified noninflammatory disorders of vagina: Secondary | ICD-10-CM | POA: Diagnosis present

## 2020-04-15 DIAGNOSIS — Z7984 Long term (current) use of oral hypoglycemic drugs: Secondary | ICD-10-CM

## 2020-04-15 DIAGNOSIS — Z880 Allergy status to penicillin: Secondary | ICD-10-CM

## 2020-04-15 DIAGNOSIS — R112 Nausea with vomiting, unspecified: Secondary | ICD-10-CM

## 2020-04-15 DIAGNOSIS — Z20822 Contact with and (suspected) exposure to covid-19: Secondary | ICD-10-CM | POA: Diagnosis present

## 2020-04-15 MED ORDER — SODIUM CHLORIDE 0.9% FLUSH
3.0000 mL | Freq: Once | INTRAVENOUS | Status: DC
  Filled 2020-04-15: qty 3

## 2020-04-15 NOTE — ED Notes (Signed)
Attempted x1 to obtain blood for protocols, unsuccessful.

## 2020-04-15 NOTE — ED Notes (Signed)
Pt unable to void at this time, specimen cup provided and encouraged to collect when possible.

## 2020-04-15 NOTE — ED Triage Notes (Signed)
Pt reports continued abd pain and emesis since her last visit on July 4th. States not much improvement in s/s.

## 2020-04-16 ENCOUNTER — Emergency Department (HOSPITAL_BASED_OUTPATIENT_CLINIC_OR_DEPARTMENT_OTHER): Payer: Medicaid Other

## 2020-04-16 ENCOUNTER — Encounter (HOSPITAL_BASED_OUTPATIENT_CLINIC_OR_DEPARTMENT_OTHER): Payer: Self-pay

## 2020-04-16 DIAGNOSIS — N390 Urinary tract infection, site not specified: Secondary | ICD-10-CM

## 2020-04-16 DIAGNOSIS — L03311 Cellulitis of abdominal wall: Secondary | ICD-10-CM | POA: Diagnosis not present

## 2020-04-16 DIAGNOSIS — E039 Hypothyroidism, unspecified: Secondary | ICD-10-CM | POA: Diagnosis present

## 2020-04-16 DIAGNOSIS — C187 Malignant neoplasm of sigmoid colon: Secondary | ICD-10-CM | POA: Diagnosis not present

## 2020-04-16 DIAGNOSIS — R1084 Generalized abdominal pain: Secondary | ICD-10-CM | POA: Diagnosis not present

## 2020-04-16 DIAGNOSIS — K2970 Gastritis, unspecified, without bleeding: Secondary | ICD-10-CM | POA: Diagnosis present

## 2020-04-16 DIAGNOSIS — E119 Type 2 diabetes mellitus without complications: Secondary | ICD-10-CM | POA: Diagnosis present

## 2020-04-16 DIAGNOSIS — K6389 Other specified diseases of intestine: Secondary | ICD-10-CM | POA: Diagnosis not present

## 2020-04-16 DIAGNOSIS — D62 Acute posthemorrhagic anemia: Secondary | ICD-10-CM | POA: Diagnosis not present

## 2020-04-16 DIAGNOSIS — D509 Iron deficiency anemia, unspecified: Secondary | ICD-10-CM | POA: Diagnosis present

## 2020-04-16 DIAGNOSIS — K56609 Unspecified intestinal obstruction, unspecified as to partial versus complete obstruction: Secondary | ICD-10-CM

## 2020-04-16 DIAGNOSIS — N898 Other specified noninflammatory disorders of vagina: Secondary | ICD-10-CM | POA: Diagnosis present

## 2020-04-16 DIAGNOSIS — R140 Abdominal distension (gaseous): Secondary | ICD-10-CM | POA: Diagnosis not present

## 2020-04-16 DIAGNOSIS — Z20822 Contact with and (suspected) exposure to covid-19: Secondary | ICD-10-CM | POA: Diagnosis present

## 2020-04-16 DIAGNOSIS — I471 Supraventricular tachycardia: Secondary | ICD-10-CM | POA: Diagnosis present

## 2020-04-16 DIAGNOSIS — E860 Dehydration: Secondary | ICD-10-CM | POA: Diagnosis not present

## 2020-04-16 DIAGNOSIS — C772 Secondary and unspecified malignant neoplasm of intra-abdominal lymph nodes: Secondary | ICD-10-CM | POA: Diagnosis present

## 2020-04-16 DIAGNOSIS — T8142XA Infection following a procedure, deep incisional surgical site, initial encounter: Secondary | ICD-10-CM | POA: Diagnosis not present

## 2020-04-16 DIAGNOSIS — T8131XA Disruption of external operation (surgical) wound, not elsewhere classified, initial encounter: Secondary | ICD-10-CM | POA: Diagnosis present

## 2020-04-16 DIAGNOSIS — R188 Other ascites: Secondary | ICD-10-CM | POA: Diagnosis present

## 2020-04-16 DIAGNOSIS — R933 Abnormal findings on diagnostic imaging of other parts of digestive tract: Secondary | ICD-10-CM | POA: Diagnosis not present

## 2020-04-16 DIAGNOSIS — K317 Polyp of stomach and duodenum: Secondary | ICD-10-CM | POA: Diagnosis not present

## 2020-04-16 DIAGNOSIS — E876 Hypokalemia: Secondary | ICD-10-CM | POA: Diagnosis not present

## 2020-04-16 DIAGNOSIS — L7632 Postprocedural hematoma of skin and subcutaneous tissue following other procedure: Secondary | ICD-10-CM | POA: Diagnosis not present

## 2020-04-16 DIAGNOSIS — R112 Nausea with vomiting, unspecified: Secondary | ICD-10-CM | POA: Diagnosis present

## 2020-04-16 DIAGNOSIS — C19 Malignant neoplasm of rectosigmoid junction: Secondary | ICD-10-CM | POA: Diagnosis present

## 2020-04-16 DIAGNOSIS — E669 Obesity, unspecified: Secondary | ICD-10-CM | POA: Diagnosis present

## 2020-04-16 DIAGNOSIS — K648 Other hemorrhoids: Secondary | ICD-10-CM | POA: Diagnosis present

## 2020-04-16 DIAGNOSIS — K641 Second degree hemorrhoids: Secondary | ICD-10-CM | POA: Diagnosis present

## 2020-04-16 DIAGNOSIS — I10 Essential (primary) hypertension: Secondary | ICD-10-CM | POA: Diagnosis present

## 2020-04-16 DIAGNOSIS — K56691 Other complete intestinal obstruction: Secondary | ICD-10-CM | POA: Diagnosis not present

## 2020-04-16 LAB — URINALYSIS, MICROSCOPIC (REFLEX): WBC, UA: >50 WBC/hpf (ref 0–5)

## 2020-04-16 LAB — GLUCOSE, CAPILLARY
Glucose-Capillary: 110 mg/dL — ABNORMAL HIGH (ref 70–99)
Glucose-Capillary: 114 mg/dL — ABNORMAL HIGH (ref 70–99)
Glucose-Capillary: 124 mg/dL — ABNORMAL HIGH (ref 70–99)

## 2020-04-16 LAB — CBC
HCT: 40.7 % (ref 36.0–46.0)
HCT: 38.3 % (ref 36.0–46.0)
Hemoglobin: 13 g/dL (ref 12.0–15.0)
Hemoglobin: 12.2 g/dL (ref 12.0–15.0)
MCH: 28.4 pg (ref 26.0–34.0)
MCH: 28.4 pg (ref 26.0–34.0)
MCHC: 31.9 g/dL (ref 30.0–36.0)
MCHC: 31.9 g/dL (ref 30.0–36.0)
MCV: 89.1 fL (ref 80.0–100.0)
MCV: 89.3 fL (ref 80.0–100.0)
Platelets: 316 10*3/uL (ref 150–400)
Platelets: 310 10*3/uL (ref 150–400)
RBC: 4.57 MIL/uL (ref 3.87–5.11)
RBC: 4.29 MIL/uL (ref 3.87–5.11)
RDW: 17.6 % — ABNORMAL HIGH (ref 11.5–15.5)
RDW: 17.2 % — ABNORMAL HIGH (ref 11.5–15.5)
WBC: 8.3 10*3/uL (ref 4.0–10.5)
WBC: 5.9 10*3/uL (ref 4.0–10.5)
nRBC: 0 % (ref 0.0–0.2)
nRBC: 0 % (ref 0.0–0.2)

## 2020-04-16 LAB — COMPREHENSIVE METABOLIC PANEL
ALT: 11 U/L (ref 0–44)
AST: 16 U/L (ref 15–41)
Albumin: 4.3 g/dL (ref 3.5–5.0)
Alkaline Phosphatase: 58 U/L (ref 38–126)
Anion gap: 14 (ref 5–15)
BUN: 10 mg/dL (ref 8–23)
CO2: 21 mmol/L — ABNORMAL LOW (ref 22–32)
Calcium: 7.7 mg/dL — ABNORMAL LOW (ref 8.9–10.3)
Chloride: 102 mmol/L (ref 98–111)
Creatinine, Ser: 0.48 mg/dL (ref 0.44–1.00)
GFR calc Af Amer: >60 mL/min (ref >60)
GFR calc non Af Amer: >60 mL/min (ref >60)
Glucose, Bld: 142 mg/dL — ABNORMAL HIGH (ref 70–99)
Potassium: 3.6 mmol/L (ref 3.5–5.1)
Sodium: 137 mmol/L (ref 135–145)
Total Bilirubin: 0.5 mg/dL (ref 0.3–1.2)
Total Protein: 7.9 g/dL (ref 6.5–8.1)

## 2020-04-16 LAB — LIPASE, BLOOD: Lipase: 19 U/L (ref 11–51)

## 2020-04-16 LAB — URINALYSIS, ROUTINE W REFLEX MICROSCOPIC
Glucose, UA: NEGATIVE mg/dL
Ketones, ur: 40 mg/dL — AB
Nitrite: NEGATIVE
Protein, ur: 30 mg/dL — AB
Specific Gravity, Urine: >1.03 — ABNORMAL HIGH (ref 1.005–1.030)
pH: 6 (ref 5.0–8.0)

## 2020-04-16 LAB — HEMOGLOBIN A1C
Hgb A1c MFr Bld: 5.3 % (ref 4.8–5.6)
Mean Plasma Glucose: 105.41 mg/dL

## 2020-04-16 LAB — CREATININE, SERUM
Creatinine, Ser: 0.58 mg/dL (ref 0.44–1.00)
GFR calc Af Amer: >60 mL/min (ref >60)
GFR calc non Af Amer: >60 mL/min (ref >60)

## 2020-04-16 LAB — CBG MONITORING, ED: Glucose-Capillary: 125 mg/dL — ABNORMAL HIGH (ref 70–99)

## 2020-04-16 LAB — MAGNESIUM: Magnesium: 1.9 mg/dL (ref 1.7–2.4)

## 2020-04-16 LAB — SARS CORONAVIRUS 2 BY RT PCR (HOSPITAL ORDER, PERFORMED IN ~~LOC~~ HOSPITAL LAB): SARS Coronavirus 2: NEGATIVE

## 2020-04-16 MED ORDER — HYDRALAZINE HCL 20 MG/ML IJ SOLN: 10.0000 mg | Freq: Four times a day (QID) | INTRAMUSCULAR | Status: DC | PRN

## 2020-04-16 MED ORDER — LEVOTHYROXINE SODIUM 75 MCG PO TABS: 75.0000 ug | ORAL_TABLET | Freq: Every day | ORAL | Status: DC

## 2020-04-16 MED ORDER — ONDANSETRON HCL 4 MG/2ML IJ SOLN
4.0000 mg | Freq: Once | INTRAMUSCULAR | Status: AC
  Administered 2020-04-16: 4 mg via INTRAVENOUS
  Filled 2020-04-16: qty 2

## 2020-04-16 MED ORDER — IOHEXOL 300 MG/ML  SOLN
100.0000 mL | Freq: Once | INTRAMUSCULAR | Status: AC | PRN
  Administered 2020-04-16: 100 mL via INTRAVENOUS

## 2020-04-16 MED ORDER — ONDANSETRON HCL 4 MG/2ML IJ SOLN
4.0000 mg | Freq: Four times a day (QID) | INTRAMUSCULAR | Status: DC | PRN
  Administered 2020-04-17 – 2020-04-25 (×2): 4 mg via INTRAVENOUS
  Filled 2020-04-16 (×2): qty 2

## 2020-04-16 MED ORDER — HYDROMORPHONE HCL 1 MG/ML IJ SOLN
0.5000 mg | INTRAMUSCULAR | Status: DC | PRN
  Administered 2020-04-17 – 2020-04-18 (×4): 1 mg via INTRAVENOUS
  Filled 2020-04-16 (×5): qty 1

## 2020-04-16 MED ORDER — INSULIN ASPART 100 UNIT/ML ~~LOC~~ SOLN: 0.0000 [IU] | Freq: Every day | SUBCUTANEOUS | Status: DC

## 2020-04-16 MED ORDER — ENOXAPARIN SODIUM 40 MG/0.4ML ~~LOC~~ SOLN
40.0000 mg | SUBCUTANEOUS | Status: DC
  Administered 2020-04-16 – 2020-05-01 (×16): 40 mg via SUBCUTANEOUS
  Filled 2020-04-16 (×16): qty 0.4

## 2020-04-16 MED ORDER — ASPIRIN EC 81 MG PO TBEC
81.0000 mg | DELAYED_RELEASE_TABLET | Freq: Every day | ORAL | Status: DC
  Administered 2020-04-16 – 2020-05-02 (×13): 81 mg via ORAL
  Filled 2020-04-16 (×16): qty 1

## 2020-04-16 MED ORDER — ENOXAPARIN SODIUM 40 MG/0.4ML ~~LOC~~ SOLN: 40.0000 mg | SUBCUTANEOUS | Status: DC

## 2020-04-16 MED ORDER — PANTOPRAZOLE SODIUM 40 MG PO TBEC
40.0000 mg | DELAYED_RELEASE_TABLET | Freq: Every day | ORAL | Status: DC
  Administered 2020-04-16 – 2020-04-19 (×3): 40 mg via ORAL
  Filled 2020-04-16 (×4): qty 1

## 2020-04-16 MED ORDER — INSULIN ASPART 100 UNIT/ML ~~LOC~~ SOLN
0.0000 [IU] | Freq: Three times a day (TID) | SUBCUTANEOUS | Status: DC
  Administered 2020-04-16 – 2020-04-17 (×2): 2 [IU] via SUBCUTANEOUS

## 2020-04-16 MED ORDER — DEXTROSE-NACL 5-0.45 % IV SOLN: INTRAVENOUS | Status: DC

## 2020-04-16 MED ORDER — NITROGLYCERIN 0.4 MG SL SUBL: 0.4000 mg | SUBLINGUAL_TABLET | SUBLINGUAL | Status: DC | PRN

## 2020-04-16 MED ORDER — SODIUM CHLORIDE 0.9 % IV SOLN: INTRAVENOUS | Status: DC

## 2020-04-16 MED ORDER — SODIUM CHLORIDE 0.9 % IV SOLN
1.0000 g | Freq: Once | INTRAVENOUS | Status: AC
  Administered 2020-04-16: 1 g via INTRAVENOUS
  Filled 2020-04-16: qty 10

## 2020-04-16 MED ORDER — ACETAMINOPHEN 325 MG PO TABS: 650.0000 mg | ORAL_TABLET | Freq: Four times a day (QID) | ORAL | Status: DC | PRN

## 2020-04-16 MED ORDER — SODIUM CHLORIDE 0.9 % IV SOLN
1.0000 g | Freq: Once | INTRAVENOUS | Status: AC
  Administered 2020-04-16: 1 g via INTRAVENOUS
  Filled 2020-04-16: qty 1

## 2020-04-16 MED ORDER — SODIUM CHLORIDE 0.9 % IV BOLUS
1000.0000 mL | Freq: Once | INTRAVENOUS | Status: AC
  Administered 2020-04-16: 1000 mL via INTRAVENOUS

## 2020-04-16 MED ORDER — HYDROMORPHONE HCL 1 MG/ML IJ SOLN
0.5000 mg | Freq: Once | INTRAMUSCULAR | Status: AC
  Administered 2020-04-16: 0.5 mg via INTRAVENOUS
  Filled 2020-04-16: qty 1

## 2020-04-16 MED ORDER — OXYCODONE HCL 5 MG PO TABS: 5.0000 mg | ORAL_TABLET | ORAL | Status: DC | PRN

## 2020-04-16 MED ORDER — METOPROLOL TARTRATE 25 MG PO TABS: 25.0000 mg | ORAL_TABLET | Freq: Two times a day (BID) | ORAL | Status: DC

## 2020-04-16 MED ORDER — PANTOPRAZOLE SODIUM 40 MG IV SOLR
40.0000 mg | Freq: Once | INTRAVENOUS | Status: AC
  Administered 2020-04-16: 40 mg via INTRAVENOUS
  Filled 2020-04-16: qty 40

## 2020-04-16 MED ORDER — ACETAMINOPHEN 650 MG RE SUPP: 650.0000 mg | Freq: Four times a day (QID) | RECTAL | Status: DC | PRN

## 2020-04-16 MED ORDER — ONDANSETRON HCL 4 MG PO TABS: 4.0000 mg | ORAL_TABLET | Freq: Four times a day (QID) | ORAL | Status: DC | PRN

## 2020-04-16 NOTE — H&P (Addendum)
History and Physical    Bailey Walker NWG:[id number] DOB: 1946-01-07 DOA: 04/15/2020  PCP: [name], MD  Patient coming from: [facility]  I have personally briefly reviewed patient's old medical records in [place]  Chief Complaint: Abdominal pain and vomiting since 2 days  HPI: Bailey Walker is a 74 y.o. female with medical history significant of hypertension, type 2 diabetes mellitus, hypothyroidism, obesity presents to emergency department with abdominal pain and vomiting since 2 days.  Patient speaks Arabic.  History gathered from patient's daughter at the bedside.  She mentioned that patient has severe generalized abdominal pain and vomiting since July 4 however it got worse since 2 days.  She could not keep anything down, she has 10 out of 10, nonradiating, sharp pain, no aggravating or relieving factors, associated with vomiting 4 times since yesterday.  Vomitus is nonbloody.  She has lost about 7 to 10 pounds since July for the due to abdominal pain and vomiting.  No history of colon cancer in the family.  No night sweats, melena, fever, chills, headache, blurry vision, chest pain, shortness of breath, palpitation, leg swelling.  Reports dysuria since few days.  No foul-smelling or change in urinary frequency, lower back pain.  She had regular bowel movement this morning.  She is a former smoker.  No history of alcohol, illicit drug use.  ED Course: Upon arrival to ED: Patient's vital signs stable.  She received IV fluid bolus, Protonix, Zofran, Dilaudid as needed for pain control and Rocephin for possible UTI in ED.  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  Patient transferred to [facility] for further evaluation and management.  Review of Systems: As per HPI otherwise negative.    Past Medical History:  Diagnosis Date  . Diabetes mellitus without complication ([place])   . Hypertension   . SVT  (supraventricular tachycardia) ([place])   . Thyroid disease     Past Surgical History:  Procedure Laterality Date  . APPENDECTOMY    . BREAST SURGERY    . EYE SURGERY Bilateral    april and march 2019 for cataracts.   . fatty gland     left wrist , right breast  . KNEE SURGERY     left knee     reports that she quit smoking about 6 years ago. She has never used smokeless tobacco. She reports that she does not drink alcohol and does not use drugs.  Allergies  Allergen Reactions  . Nitroglycerin Nausea And Vomiting  . Ampicillin Other (See Comments)    Per allergy test    Family History  Problem Relation Age of Onset  . Breast cancer Cousin        mat and pat sides  . Cancer Sister        cancer everywhere  . Colon cancer Neg Hx   . Esophageal cancer Neg Hx   . Rectal cancer Neg Hx     Prior to Admission medications   Medication Sig Start Date End Date Taking? Authorizing Provider  alendronate (FOSAMAX) 70 MG tablet Take 70 mg by mouth once a week. 03/01/20   [provider]  ASPIRIN LOW DOSE 81 MG EC tablet Take 81 mg by mouth daily. 03/07/20   [provider]  DEXILANT 30 MG capsule TAKE 1 CAPSULE BY MOUTH EVERY DAY 10/20/19   Tresa Garter, MD  Dextromethorphan-guaiFENesin 10-100 MG/5ML liquid Take 5 mLs by mouth every 12 (twelve) hours.  [provider]  ferrous gluconate (FERGON) 324 MG tablet Take 648 mg by mouth daily. 01/25/20   [provider]  glipiZIDE (GLUCOTROL) 5 MG tablet TAKE 1 TABLET BY MOUTH TWICE A DAY BEFORE MEALS 11/04/19   Dorena Dew, FNP  levothyroxine (SYNTHROID) 75 MCG tablet TAKE 1 TABLET BY MOUTH EVERY DAY 01/02/20   Dorena Dew, FNP  metFORMIN (GLUCOPHAGE) 500 MG tablet Take 0.5 tablets (250 mg total) by mouth 2 (two) times daily with a meal. 05/16/19   [name], FNP  metoprolol tartrate (LOPRESSOR) 100 MG tablet Take 1 tablet (100 mg total) by mouth once for 1 dose. Please take one time dose  100mg  metoprolol tartrate 2 hr prior to cardiac CT for HR control IF HR >55bpm. 01/30/20 01/30/20  Lorretta Harp, MD  metoprolol tartrate (LOPRESSOR) 25 MG tablet Take 1 tablet (25 mg total) by mouth 2 (two) times daily. 08/02/19   Tresa Garter, MD  nitroGLYCERIN (NITROSTAT) 0.4 MG SL tablet Place 1 tablet (0.4 mg total) under the tongue every 5 (five) minutes as needed for chest pain. 05/16/19 08/14/19  [name], FNP  ondansetron (ZOFRAN) 4 MG tablet Take 1 tablet (4 mg total) by mouth every 8 (eight) hours as needed for nausea or vomiting. 03/25/20   Hayden Rasmussen, MD  PROLIA 60 MG/ML SOSY injection Inject into the skin. 03/23/20   [provider]  promethazine (PHENERGAN) 12.5 MG suppository Place 2 suppositories (25 mg total) rectally every 6 (six) hours as needed for nausea or vomiting. 03/25/20   Hayden Rasmussen, MD  Vitamin D, Ergocalciferol, (DRISDOL) 1.25 MG (50000 UNIT) CAPS capsule Take 50,000 Units by mouth once a week. 01/24/20   [provider]    Physical Exam: Vitals:   04/16/20 1118 04/16/20 1300 04/16/20 1402 04/16/20 1520  BP: (!) 160/74 (!) 130/73 (!) 142/79 (!) 139/73  Pulse: 87 85 83 88  Resp: 18 18 18 16   Temp: 98.4 F (36.9 C) 98.4 F (36.9 C) 98 F (36.7 C) 98.8 F (37.1 C)  TempSrc: Oral   Oral  SpO2: 97% 95% 95% 99%  Weight:        Constitutional: NAD, calm, comfortable, on room air, obese Eyes: PERRL, lids and conjunctivae normal ENMT: Mucous membranes are moist. Posterior pharynx clear of any exudate or lesions.Normal dentition.  Neck: normal, supple, no masses, no thyromegaly Respiratory: clear to auscultation bilaterally, no wheezing, no crackles. Normal respiratory effort. No accessory muscle use.  Cardiovascular: Regular rate and rhythm, no murmurs / rubs / gallops. No extremity edema. 2+ pedal pulses. No carotid bruits.  Abdomen: Tenderness positive on touch.  Bowel sounds positive. Musculoskeletal: no clubbing /  cyanosis. No joint deformity upper and lower extremities. Good ROM, no contractures. Normal muscle tone.  Skin: no rashes, lesions, ulcers. No induration Neurologic: CN 2-12 grossly intact. Sensation intact, DTR normal. Strength 5/5 in all 4.  Psychiatric: Normal judgment and insight. Alert and oriented x 3. Normal mood.    Labs on Admission: I have personally reviewed following labs and imaging studies  CBC: Recent Labs  Lab 04/16/20 0042  WBC 8.3  HGB 13.0  HCT 40.7  MCV 89.1  PLT 761   Basic Metabolic Panel: Recent Labs  Lab 04/16/20 0042  NA 137  K 3.6  CL 102  CO2 21*  GLUCOSE 142*  BUN 10  CREATININE 0.48  CALCIUM 7.7*   GFR: Estimated Creatinine Clearance: 54.6 mL/min (by C-G formula based on SCr  of 0.48 mg/dL). Liver Function Tests: Recent Labs  Lab 04/16/20 0042  AST 16  ALT 11  ALKPHOS 58  BILITOT 0.5  PROT 7.9  ALBUMIN 4.3   Recent Labs  Lab 04/16/20 0042  LIPASE 19   No results for input(s): AMMONIA in the last 168 hours. Coagulation Profile: No results for input(s): INR, PROTIME in the last 168 hours. Cardiac Enzymes: No results for input(s): CKTOTAL, CKMB, CKMBINDEX, TROPONINI in the last 168 hours. BNP (last 3 results) No results for input(s): PROBNP in the last 8760 hours. HbA1C: No results for input(s): HGBA1C in the last 72 hours. CBG: Recent Labs  Lab 04/16/20 1304  GLUCAP 125*   Lipid Profile: No results for input(s): CHOL, HDL, LDLCALC, TRIG, CHOLHDL, LDLDIRECT in the last 72 hours. Thyroid Function Tests: No results for input(s): TSH, T4TOTAL, FREET4, T3FREE, THYROIDAB in the last 72 hours. Anemia Panel: No results for input(s): VITAMINB12, FOLATE, FERRITIN, TIBC, IRON, RETICCTPCT in the last 72 hours. Urine analysis:    Component Value Date/Time   COLORURINE YELLOW 04/16/2020 0013   APPEARANCEUR CLOUDY (A) 04/16/2020 0013   LABSPEC >1.030 (H) 04/16/2020 0013   PHURINE 6.0 04/16/2020 0013   GLUCOSEU NEGATIVE 04/16/2020  0013   HGBUR SMALL (A) 04/16/2020 0013   BILIRUBINUR SMALL (A) 04/16/2020 0013   BILIRUBINUR neg 05/16/2019 1446   KETONESUR 40 (A) 04/16/2020 0013   PROTEINUR 30 (A) 04/16/2020 0013   UROBILINOGEN 0.2 05/16/2019 1446   UROBILINOGEN 0.2 11/09/2017 1559   NITRITE NEGATIVE 04/16/2020 0013   LEUKOCYTESUR MODERATE (A) 04/16/2020 0013    Radiological Exams on Admission: CT Abdomen Pelvis W Contrast  Result Date: 04/16/2020 CLINICAL DATA:  Abdominal pain.  Emesis. EXAM: CT ABDOMEN AND PELVIS WITH CONTRAST TECHNIQUE: Multidetector CT imaging of the abdomen and pelvis was performed using the standard protocol following bolus administration of intravenous contrast. CONTRAST:  131mL OMNIPAQUE IOHEXOL 300 MG/ML  SOLN COMPARISON:  March 25, 2020 FINDINGS: Lower chest: The lung bases are clear. The heart is enlarged. Hepatobiliary: The liver is normal. Normal gallbladder.There is no biliary ductal dilation. Pancreas: Normal contours without ductal dilatation. No peripancreatic fluid collection. Spleen: Unremarkable. Adrenals/Urinary Tract: --Adrenal glands: Unremarkable. --Right kidney/ureter: No hydronephrosis or radiopaque kidney stones. --Left kidney/ureter: No hydronephrosis or radiopaque kidney stones. --Urinary bladder: Unremarkable. Stomach/Bowel: --Stomach/Duodenum: There is a small to moderate-sized hiatal hernia. Again noted are thickened rugal folds similar to prior study. --Small bowel: There are dilated loops of small bowel involving the distal and terminal ileum. These loops of small bowel demonstrate fecalization consistent with slow transit. --Colon: . There is significant distention of the colon which has progressed since the prior study. There is a large amount of stool in the colon. There is an apparent transition point in the sigmoid colon (axial series 2, image 73). At this level, there is wall thickening of the sigmoid colon. There are surrounding enlarged regional lymph nodes. There is an  additional focal area of narrowing involving the ascending colon (axial series 2, image 45). There are adjacent mildly enlarged regional lymph nodes. --Appendix: Not visualized. No right lower quadrant inflammation or free fluid. Vascular/Lymphatic: Atherosclerotic calcification is present within the non-aneurysmal abdominal aorta, without hemodynamically significant stenosis. --No retroperitoneal lymphadenopathy. --there are enlarged regional lymph nodes about the sigmoid colon. There are enlarged lymph nodes along the course of the IMV (axial series 2, image 58). These lymph nodes measure up to approximately 0.9 cm. --No pelvic or inguinal lymphadenopathy. Reproductive: Unremarkable Other: No ascites or free  air. The abdominal wall is normal. Musculoskeletal. There is degenerative disc disease throughout the lumbar spine. There is a posterior disc osteophyte complex at the L4-L5 level resulting in spinal canal stenosis. There is no acute displaced fracture. IMPRESSION: 1. Large amount of stool in the colon with fecalization of the distal and terminal ileum. 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 There are mildly enlarged regional and IMV lymph nodes concerning for nodal metastatic disease. Surgical consultation is recommended. 2. Additional focal area of narrowing involving the ascending colon with adjacent mildly enlarged lymph nodes as detailed above. While this may be secondary to underdistention or peristalsis, an additional mass is not excluded. 3. No free air free fluid. Additional chronic findings as detailed above Aortic Atherosclerosis (ICD10-I70.0). These results were called by telephone at the time of interpretation on 04/16/2020 at 2:22 am to provider [facility] , who verbally acknowledged these results. Electronically Signed   By: Constance Holster M.D.   On: 04/16/2020 02:24     Assessment/Plan Principal Problem:   Colon obstruction (HCC) Active Problems:   Hypothyroidism   Essential hypertension   Diabetes mellitus due to underlying condition with unspecified complications ([place])    Abdominal pain with vomiting: -Likely secondary to underlying obstructive colorectal carcinoma. -Patient's vital signs are stable.  She is afebrile with no leukocytosis.  COVID-19 negative.  Lipase: WNL. -Reviewed CT abdomen/pelvis result. -Patient received IV fluid, Dilaudid, Zofran in ED. -Admit patient on the floor.  Continue gentle hydration.  We will keep her NPO.  Zofran as needed for nausea and vomiting.  Tylenol/oxycodone/Dilaudid as needed for pain control. -We will keep her n.p.o. -Consult general surgery-who recommended to consult GI for colonoscopy. -Consult Shirline Frees will come and see patient tomorrow AM. -Monitor vitals closely. -Check CEA level  UTI: Patient reports dysuria.  UA is positive for leukocyte/bacteria/WBC -Urine culture is pending.  She received Rocephin in ED.  We will continue Rocephin for 2 more days.  Follow urine culture.  Hypertension:  -Blood pressure is on lower side.  Hold metoprolol for now.  Start patient on 500 cc of IV fluid.  If blood pressure remains<100/60-give another 500 cc of normal saline.  Monitor vitals closely-discussed with RN and she verbalized understanding.  Hypothyroidism: Check TSH -Continue levothyroxine  Type 2 diabetes mellitus: Hold glipizide and Metformin.  Started patient on sliding scale insulin and monitor blood sugar closely  Iron deficiency anemia: H&H is stable. -Continue ferrous sulfate -Followed by hematology outpatient Dr. Alvy Bimler  Please note: As per RN patient was complaining of pressure and swelling when she voids.  I did pelvic exam: She has about 1.5 cm polyp/cyst in vagina.  Patient tells me that she has the swelling since many months however she did not tell anyone about it.  She is not sexually  active, never married, G0 P0.  Denies vaginal discharge.  Unsure about last Pap smear. -I discussed with patient's niece that patient needs to follow-up with OB/GYN outpatient and she verbalized understanding.  DVT prophylaxis: Lovenox/SCD  code Status: Full code Family Communication: Patient's niece  present at bedside.  Plan of care discussed with patient and her daughters in length and they verbalized understanding and agreed with it. Disposition Plan: Home after colonoscopy Consults called: GI and general surgery  admission status: Inpatient   Mckinley Jewel MD [facility]   If 7PM-7AM, please contact night-coverage [email or web address] Password  [email or web address]  04/16/2020, 3:44 PM

## 2020-04-16 NOTE — ED Notes (Signed)
Ultrasound IV attempted in right antecubital. Catheter visualized in vein but no blood return, removed.

## 2020-04-16 NOTE — Progress Notes (Signed)
Grace Phyliss Hulick is a 74 y.o. female patient admitted. Awake, alert - oriented  X 4 - no acute distress noted.  VSS - Blood pressure (Abnormal) 139/73, pulse 88, temperature 98.8 F (37.1 C), temperature source Oral, resp. rate 16, weight 72.9 kg, SpO2 99 %.    IV in place, occlusive dsg intact without redness.  Orientation to room, and floor completed.  Admission INP armband ID verified with patient/family, and in place.   SR up x 2, fall assessment complete, with patient and family able to verbalize understanding of risk associated with falls, and verbalized understanding to call nsg before up out of bed.  Call light within reach, patient able to voice, and demonstrate understanding. No evidence of skin break down noted on exam.  Admission MD notified by admitting RN     Will cont to eval and treat per MD orders.  Cyndi Bender, RN 04/16/2020 3:30 PM

## 2020-04-16 NOTE — ED Notes (Signed)
Report provided to [facility] for transport. 

## 2020-04-16 NOTE — ED Provider Notes (Signed)
[facility] EMERGENCY DEPARTMENT Provider Note   CSN: [id number] Arrival date & time: 04/15/20  1940     History Chief Complaint  Patient presents with  . Abdominal Pain    Bailey Walker is a 74 y.o. female.  Patient c/o upper abdominal pain and intermittent nausea/vomiting, for the past 3+ weeks. States was seen 7/4 in ED with abd pain and nv, and that no specific dx made or cause found, but that pain and intermittent nausea/vomiting has persisted since then. Pain constant, dull, upper abd, non radiating, without specific exacerbating or alleviating factors. No consistent change in pain w eating, although decreased appetite. Unspecified wt loss. Denies back or flank pain. No fever or chills. Emesis color of recently ingested foods/drink - not bloody or bilious. Is passing gas/having bms. Denies hx pud, pancreatitis, or gallstones. No cp or sob. No cough or uri symptoms.   The history is provided by the patient and a relative. A language interpreter was used.  Abdominal Pain Associated symptoms: nausea and vomiting   Associated symptoms: no chest pain, no cough, no dysuria, no fever, no shortness of breath and no sore throat        Past Medical History:  Diagnosis Date  . Diabetes mellitus without complication ([place])   . Hypertension   . SVT (supraventricular tachycardia) ([place])   . Thyroid disease     Patient Active Problem List   Diagnosis Date Noted  . Thrombocytosis ([place]) 01/27/2020  . Dyspnea on exertion 01/27/2020  . Iron deficiency anemia 01/26/2020  . Vitamin B12 deficiency anemia 01/26/2020  . 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 Fuchs' corneal dystrophy 09/20/2018  . 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 Diabetes mellitus due to underlying condition with unspecified complications ([place]) 14/48/1856  . Ex-smoker 08/26/2017  . Atypical  chest pain 08/26/2017  . Essential hypertension 01/11/2017  . Hypothyroidism 01/08/2017  . SVT (supraventricular tachycardia) ([place]) 01/08/2017    Past Surgical History:  Procedure Laterality Date  . APPENDECTOMY    . BREAST SURGERY    . EYE SURGERY Bilateral    april and march 2019 for cataracts.   . fatty gland     left wrist , right breast  . KNEE SURGERY     left knee     OB History   No obstetric history on file.     Family History  Problem Relation Age of Onset  . Breast cancer Cousin        mat and pat sides  . Cancer Sister        cancer everywhere  . Colon cancer Neg Hx   . Esophageal cancer Neg Hx   . Rectal cancer Neg Hx     Social History   Tobacco Use  . Smoking status: Former Smoker    Quit date: 12/29/2013    Years since quitting: 6.3  . Smokeless tobacco: Never Used  Vaping Use  . Vaping Use: Never used  Substance Use Topics  . Alcohol use: No  . Drug use: No    Home Medications Prior to Admission medications   Medication Sig Start Date End Date Taking? Authorizing Provider  alendronate (FOSAMAX) 70 MG tablet Take 70 mg by mouth once a week. 03/01/20   [provider]  ASPIRIN LOW DOSE 81 MG EC tablet Take 81 mg by mouth daily. 03/07/20   [provider]  Ross Marcus  30 MG capsule TAKE 1 CAPSULE BY MOUTH EVERY DAY 10/20/19   Tresa Garter, MD  Dextromethorphan-guaiFENesin 10-100 MG/5ML liquid Take 5 mLs by mouth every 12 (twelve) hours.    [provider]  ferrous gluconate (FERGON) 324 MG tablet Take 648 mg by mouth daily. 01/25/20   [provider]  glipiZIDE (GLUCOTROL) 5 MG tablet TAKE 1 TABLET BY MOUTH TWICE A DAY BEFORE MEALS 11/04/19   Dorena Dew, FNP  levothyroxine (SYNTHROID) 75 MCG tablet TAKE 1 TABLET BY MOUTH EVERY DAY 01/02/20   Dorena Dew, FNP  metFORMIN (GLUCOPHAGE) 500 MG tablet Take 0.5 tablets (250 mg total) by mouth 2 (two) times daily with a meal. 05/16/19   [name], FNP    metoprolol tartrate (LOPRESSOR) 100 MG tablet Take 1 tablet (100 mg total) by mouth once for 1 dose. Please take one time dose 100mg  metoprolol tartrate 2 hr prior to cardiac CT for HR control IF HR >55bpm. 01/30/20 01/30/20  Lorretta Harp, MD  metoprolol tartrate (LOPRESSOR) 25 MG tablet Take 1 tablet (25 mg total) by mouth 2 (two) times daily. 08/02/19   Tresa Garter, MD  nitroGLYCERIN (NITROSTAT) 0.4 MG SL tablet Place 1 tablet (0.4 mg total) under the tongue every 5 (five) minutes as needed for chest pain. 05/16/19 08/14/19  [name], FNP  ondansetron (ZOFRAN) 4 MG tablet Take 1 tablet (4 mg total) by mouth every 8 (eight) hours as needed for nausea or vomiting. 03/25/20   Hayden Rasmussen, MD  PROLIA 60 MG/ML SOSY injection Inject into the skin. 03/23/20   [provider]  promethazine (PHENERGAN) 12.5 MG suppository Place 2 suppositories (25 mg total) rectally every 6 (six) hours as needed for nausea or vomiting. 03/25/20   Hayden Rasmussen, MD  Vitamin D, Ergocalciferol, (DRISDOL) 1.25 MG (50000 UNIT) CAPS capsule Take 50,000 Units by mouth once a week. 01/24/20   [provider]    Allergies    Nitroglycerin and Ampicillin  Review of Systems   Review of Systems  Constitutional: Negative for fever.  HENT: Negative for sore throat.   Eyes: Negative for redness.  Respiratory: Negative for cough and shortness of breath.   Cardiovascular: Negative for chest pain.  Gastrointestinal: Positive for abdominal pain, nausea and vomiting.  Genitourinary: Negative for dysuria and flank pain.  Musculoskeletal: Negative for back pain.  Skin: Negative for rash.  Neurological: Negative for headaches.  Hematological: Does not bruise/bleed easily.  Psychiatric/Behavioral: Negative for confusion.    Physical Exam Updated Vital Signs BP (!) 161/85 (BP Location: Left Arm)   Pulse 95   Temp 98.6 F (37 C) (Oral)   Resp 16   Wt 72.9 kg   SpO2 100%   BMI 30.36 kg/m    Physical Exam Vitals and nursing note reviewed.  Constitutional:      Appearance: Normal appearance. She is well-developed.  HENT:     Head: Atraumatic.     Nose: Nose normal.     Mouth/Throat:     Mouth: Mucous membranes are moist.  Eyes:     General: No scleral icterus.    Conjunctiva/sclera: Conjunctivae normal.  Neck:     Trachea: No tracheal deviation.  Cardiovascular:     Rate and Rhythm: Normal rate and regular rhythm.     Pulses: Normal pulses.     Heart sounds: Normal heart sounds. No murmur heard.  No friction rub. No gallop.   Pulmonary:     Effort: Pulmonary  effort is normal. No respiratory distress.     Breath sounds: Normal breath sounds.  Abdominal:     General: Bowel sounds are normal. There is no distension.     Palpations: Abdomen is soft. There is no mass.     Tenderness: There is abdominal tenderness. There is no guarding or rebound.     Hernia: No hernia is present.     Comments: Upper abd tenderness.   Genitourinary:    Comments: No cva tenderness.  Musculoskeletal:        General: No swelling.     Cervical back: Normal range of motion and neck supple. No rigidity. No muscular tenderness.  Skin:    General: Skin is warm and dry.     Findings: No rash.  Neurological:     Mental Status: She is alert.     Comments: Alert, speech normal.   Psychiatric:        Mood and Affect: Mood normal.     ED Results / Procedures / Treatments   Labs (all labs ordered are listed, but only abnormal results are displayed) Results for orders placed or performed during the hospital encounter of 04/15/20  Lipase, blood  Result Value Ref Range   Lipase 19 11 - 51 U/L  Comprehensive metabolic panel  Result Value Ref Range   Sodium 137 135 - 145 mmol/L   Potassium 3.6 3.5 - 5.1 mmol/L   Chloride 102 98 - 111 mmol/L   CO2 21 (L) 22 - 32 mmol/L   Glucose, Bld 142 (H) 70 - 99 mg/dL   BUN 10 8 - 23 mg/dL   Creatinine, Ser 0.48 0.44 - 1.00 mg/dL   Calcium 7.7 (L)  8.9 - 10.3 mg/dL   Total Protein 7.9 6.5 - 8.1 g/dL   Albumin 4.3 3.5 - 5.0 g/dL   AST 16 15 - 41 U/L   ALT 11 0 - 44 U/L   Alkaline Phosphatase 58 38 - 126 U/L   Total Bilirubin 0.5 0.3 - 1.2 mg/dL   GFR calc non Af Amer >60 >60 mL/min   GFR calc Af Amer >60 >60 mL/min   Anion gap 14 5 - 15  CBC  Result Value Ref Range   WBC 8.3 4.0 - 10.5 K/uL   RBC 4.57 3.87 - 5.11 MIL/uL   Hemoglobin 13.0 12.0 - 15.0 g/dL   HCT 40.7 36 - 46 %   MCV 89.1 80.0 - 100.0 fL   MCH 28.4 26.0 - 34.0 pg   MCHC 31.9 30.0 - 36.0 g/dL   RDW 17.6 (H) 11.5 - 15.5 %   Platelets 316 150 - 400 K/uL   nRBC 0.0 0.0 - 0.2 %  Urinalysis, Routine w reflex microscopic  Result Value Ref Range   Color, Urine YELLOW YELLOW   APPearance CLOUDY (A) CLEAR   Specific Gravity, Urine >1.030 (H) 1.005 - 1.030   pH 6.0 5.0 - 8.0   Glucose, UA NEGATIVE NEGATIVE mg/dL   Hgb urine dipstick SMALL (A) NEGATIVE   Bilirubin Urine SMALL (A) NEGATIVE   Ketones, ur 40 (A) NEGATIVE mg/dL   Protein, ur 30 (A) NEGATIVE mg/dL   Nitrite NEGATIVE NEGATIVE   Leukocytes,Ua MODERATE (A) NEGATIVE  Urinalysis, Microscopic (reflex)  Result Value Ref Range   RBC / HPF 6-10 0 - 5 RBC/hpf   WBC, UA >50 0 - 5 WBC/hpf   Bacteria, UA MANY (A) NONE SEEN   Squamous Epithelial / LPF 6-10 0 - 5  Non Squamous Epithelial PRESENT (A) NONE SEEN   WBC Clumps PRESENT    Mucus PRESENT    CT Abdomen Pelvis W Contrast  Result Date: 03/25/2020 CLINICAL DATA:  Abdominal distension, unable to tolerate solid or liquid diet. EXAM: CT ABDOMEN AND PELVIS WITH CONTRAST TECHNIQUE: Multidetector CT imaging of the abdomen and pelvis was performed using the standard protocol following bolus administration of intravenous contrast. CONTRAST:  163mL OMNIPAQUE IOHEXOL 300 MG/ML  SOLN COMPARISON:  CT 08/12/2017 FINDINGS: Lower chest: Dependent and subsegmental atelectatic changes in the lung bases. Otherwise clear. Cardiac size at the upper limits of normal. No pericardial  effusion. Coronary artery calcifications are present. Hepatobiliary: Diffuse mild hepatic hypoattenuation may reflect some mild hepatic steatosis. Smooth surface contour. Smooth blush of contrast again seen in the posterior right lobe liver (2/34) normalizing on delayed phase imaging may reflect a transient hepatic attenuation difference or flash filling hemangioma. No concerning liver lesions. Normal gallbladder and biliary tree without calcified gallstones. Pancreas: Partial fatty replacement of the pancreas. No pancreatic ductal dilatation or surrounding inflammatory changes. Spleen: Normal in size without focal abnormality. Adrenals/Urinary Tract: Normal adrenal glands. Kidneys enhance and excrete create symmetrically. No concerning renal lesions. No urolithiasis or hydronephrosis. Urinary bladder is largely decompressed at the time of exam and therefore poorly evaluated by CT imaging. No gross bladder abnormality. Stomach/Bowel: Moderate hiatal hernia, similar in size and appearance to comparison. Prominent rugal folds of the stomach. Mild thickening towards the antrum likely related to contraction. Fluid-filled appearance of the more mid to distal small bowel without evidence of frank distention or evidence of high-grade obstruction. Some minimal mural thickening may be present towards the ilium as well. The colon is also diffusely fluid-filled. Mild edematous mural thickening of the colon is present more pronounced at the rectosigmoid. The appendix is surgically absent. Vascular/Lymphatic: Atherosclerotic calcifications within the abdominal aorta and branch vessels. No aneurysm or ectasia. No enlarged abdominopelvic lymph nodes. Reactive appearing nonenlarged nodes present in the mesentery. Reproductive: Retroflexed, fibroid uterus. Stable coarse calcification of the left ovary. Concerning adnexal lesions are seen. Other: Small volume of fluid seen within the anterior abdomen adjacent several of the  fluid-filled loops of bowel, favored to be reactive. No abdominopelvic free air. No bowel containing hernias. Musculoskeletal: Dextrocurvature of the lumbar spine, apex L3. Multilevel degenerative changes are present in the imaged portions of the spine. Additional degenerative changes in the hips and pelvis. No acute osseous abnormality or suspicious osseous lesion. IMPRESSION: 1. Fluid-filled appearance of the small bowel without frank distention or evidence of obstruction. Additional fluid-filled and mildly edematous colon. Findings may reflect an enterocolitis, which could be infectious or inflammatory in etiology. 2. Small volume of fluid in the anterior mesentery, likely reactive. 3. Moderate hiatal hernia, similar in size and appearance to comparison to comparison. 4. Thickened rugal folds, could correlate for clinical symptoms of gastritis. 5. Retroflexed, fibroid uterus. 6. Aortic Atherosclerosis (ICD10-I70.0). Electronically Signed   By: Lovena Le M.D.   On: 03/25/2020 19:23    EKG None  Radiology CT Abdomen Pelvis W Contrast  Result Date: 04/16/2020 CLINICAL DATA:  Abdominal pain.  Emesis. EXAM: CT ABDOMEN AND PELVIS WITH CONTRAST TECHNIQUE: Multidetector CT imaging of the abdomen and pelvis was performed using the standard protocol following bolus administration of intravenous contrast. CONTRAST:  148mL OMNIPAQUE IOHEXOL 300 MG/ML  SOLN COMPARISON:  March 25, 2020 FINDINGS: Lower chest: The lung bases are clear. The heart is enlarged. Hepatobiliary: The liver is normal. Normal gallbladder.There is no  biliary ductal dilation. Pancreas: Normal contours without ductal dilatation. No peripancreatic fluid collection. Spleen: Unremarkable. Adrenals/Urinary Tract: --Adrenal glands: Unremarkable. --Right kidney/ureter: No hydronephrosis or radiopaque kidney stones. --Left kidney/ureter: No hydronephrosis or radiopaque kidney stones. --Urinary bladder: Unremarkable. Stomach/Bowel: --Stomach/Duodenum:  There is a small to moderate-sized hiatal hernia. Again noted are thickened rugal folds similar to prior study. --Small bowel: There are dilated loops of small bowel involving the distal and terminal ileum. These loops of small bowel demonstrate fecalization consistent with slow transit. --Colon: . There is significant distention of the colon which has progressed since the prior study. There is a large amount of stool in the colon. There is an apparent transition point in the sigmoid colon (axial series 2, image 73). At this level, there is wall thickening of the sigmoid colon. There are surrounding enlarged regional lymph nodes. There is an additional focal area of narrowing involving the ascending colon (axial series 2, image 45). There are adjacent mildly enlarged regional lymph nodes. --Appendix: Not visualized. No right lower quadrant inflammation or free fluid. Vascular/Lymphatic: Atherosclerotic calcification is present within the non-aneurysmal abdominal aorta, without hemodynamically significant stenosis. --No retroperitoneal lymphadenopathy. --there are enlarged regional lymph nodes about the sigmoid colon. There are enlarged lymph nodes along the course of the IMV (axial series 2, image 58). These lymph nodes measure up to approximately 0.9 cm. --No pelvic or inguinal lymphadenopathy. Reproductive: Unremarkable Other: No ascites or free air. The abdominal wall is normal. Musculoskeletal. There is degenerative disc disease throughout the lumbar spine. There is a posterior disc osteophyte complex at the L4-L5 level resulting in spinal canal stenosis. There is no acute displaced fracture. IMPRESSION: 1. Large amount of stool in the colon with fecalization of the distal and terminal ileum. 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 There are mildly enlarged regional and IMV lymph nodes  concerning for nodal metastatic disease. Surgical consultation is recommended. 2. Additional focal area of narrowing involving the ascending colon with adjacent mildly enlarged lymph nodes as detailed above. While this may be secondary to underdistention or peristalsis, an additional mass is not excluded. 3. No free air free fluid. Additional chronic findings as detailed above Aortic Atherosclerosis (ICD10-I70.0). These results were called by telephone at the time of interpretation on 04/16/2020 at 2:22 am to provider [facility] , who verbally acknowledged these results. Electronically Signed   By: Constance Holster M.D.   On: 04/16/2020 02:24    Procedures Procedures (including critical care time)  Medications Ordered in ED Medications  sodium chloride flush (NS) 0.9 % injection 3 mL (has no administration in time range)  cefTRIAXone (ROCEPHIN) 1 g in sodium chloride 0.9 % 100 mL IVPB (has no administration in time range)  sodium chloride 0.9 % bolus 1,000 mL (1,000 mLs Intravenous New Bag/Given 04/16/20 0047)  HYDROmorphone (DILAUDID) injection 0.5 mg (0.5 mg Intravenous Given 04/16/20 0048)  ondansetron (ZOFRAN) injection 4 mg (4 mg Intravenous Given 04/16/20 0048)  pantoprazole (PROTONIX) injection 40 mg (40 mg Intravenous Given 04/16/20 0047)    ED Course  I have reviewed the triage vital signs and the nursing notes.  Pertinent labs & imaging results that were available during my care of the patient were reviewed by me and considered in my medical decision making (see chart for details).    MDM Rules/Calculators/A&P  Labs sent. Iv ns bolus. protonix iv. zofran iv. Dilaudid iv.   Reviewed nursing notes and prior charts for additional history. Prior ct reviewed, ?thickened stomach ?gastritis. No SBO then.   Labs reviewed/interpreted by me  - wbc normal.  Abd persistently painful/tender. Will get ct.   Additional labs reviewed/interpreted by me - possible uti  on labs with > 50 wbc, will culture and tx. Also ketones on UA. IVF.   CT  Reviewed/interpreted by me - ?thickened segement colon, possible colon ca.   As pts symptoms persistent, and progressively worse in past few weeks, persistent pain, nv, poor po intake, will admit for ivf, nausea and pain control, uti/urine culture,and further evaluation.   Pt/fam prefer [facility] hospital. [facility] hospitalist consulted for admission.     Final Clinical Impression(s) / ED Diagnoses Final diagnoses:  None    Rx / DC Orders ED Discharge Orders    None       Lajean Saver, MD 04/16/20 [phone]

## 2020-04-17 ENCOUNTER — Encounter (HOSPITAL_COMMUNITY): Payer: Self-pay | Admitting: Family Medicine

## 2020-04-17 ENCOUNTER — Inpatient Hospital Stay (HOSPITAL_COMMUNITY): Payer: Medicaid Other

## 2020-04-17 DIAGNOSIS — R933 Abnormal findings on diagnostic imaging of other parts of digestive tract: Secondary | ICD-10-CM

## 2020-04-17 DIAGNOSIS — E088 Diabetes mellitus due to underlying condition with unspecified complications: Secondary | ICD-10-CM

## 2020-04-17 DIAGNOSIS — K56691 Other complete intestinal obstruction: Secondary | ICD-10-CM

## 2020-04-17 DIAGNOSIS — N3000 Acute cystitis without hematuria: Secondary | ICD-10-CM

## 2020-04-17 DIAGNOSIS — R140 Abdominal distension (gaseous): Secondary | ICD-10-CM

## 2020-04-17 DIAGNOSIS — E860 Dehydration: Secondary | ICD-10-CM

## 2020-04-17 DIAGNOSIS — R112 Nausea with vomiting, unspecified: Secondary | ICD-10-CM

## 2020-04-17 DIAGNOSIS — E038 Other specified hypothyroidism: Secondary | ICD-10-CM

## 2020-04-17 DIAGNOSIS — R1084 Generalized abdominal pain: Secondary | ICD-10-CM

## 2020-04-17 DIAGNOSIS — N390 Urinary tract infection, site not specified: Secondary | ICD-10-CM

## 2020-04-17 DIAGNOSIS — I10 Essential (primary) hypertension: Secondary | ICD-10-CM

## 2020-04-17 DIAGNOSIS — K6389 Other specified diseases of intestine: Secondary | ICD-10-CM

## 2020-04-17 LAB — GLUCOSE, CAPILLARY
Glucose-Capillary: 114 mg/dL — ABNORMAL HIGH (ref 70–99)
Glucose-Capillary: 122 mg/dL — ABNORMAL HIGH (ref 70–99)
Glucose-Capillary: 110 mg/dL — ABNORMAL HIGH (ref 70–99)
Glucose-Capillary: 129 mg/dL — ABNORMAL HIGH (ref 70–99)
Glucose-Capillary: 85 mg/dL (ref 70–99)

## 2020-04-17 LAB — COMPREHENSIVE METABOLIC PANEL
ALT: 9 U/L (ref 0–44)
AST: 10 U/L — ABNORMAL LOW (ref 15–41)
Albumin: 3.3 g/dL — ABNORMAL LOW (ref 3.5–5.0)
Alkaline Phosphatase: 47 U/L (ref 38–126)
Anion gap: 10 (ref 5–15)
BUN: 8 mg/dL (ref 8–23)
CO2: 21 mmol/L — ABNORMAL LOW (ref 22–32)
Calcium: 7 mg/dL — ABNORMAL LOW (ref 8.9–10.3)
Chloride: 106 mmol/L (ref 98–111)
Creatinine, Ser: 0.59 mg/dL (ref 0.44–1.00)
GFR calc Af Amer: >60 mL/min (ref >60)
GFR calc non Af Amer: >60 mL/min (ref >60)
Glucose, Bld: 122 mg/dL — ABNORMAL HIGH (ref 70–99)
Potassium: 3.4 mmol/L — ABNORMAL LOW (ref 3.5–5.1)
Sodium: 137 mmol/L (ref 135–145)
Total Bilirubin: 0.7 mg/dL (ref 0.3–1.2)
Total Protein: 6.3 g/dL — ABNORMAL LOW (ref 6.5–8.1)

## 2020-04-17 LAB — CBC
HCT: 37.8 % (ref 36.0–46.0)
Hemoglobin: 11.6 g/dL — ABNORMAL LOW (ref 12.0–15.0)
MCH: 27.8 pg (ref 26.0–34.0)
MCHC: 30.7 g/dL (ref 30.0–36.0)
MCV: 90.6 fL (ref 80.0–100.0)
Platelets: 276 10*3/uL (ref 150–400)
RBC: 4.17 MIL/uL (ref 3.87–5.11)
RDW: 17.1 % — ABNORMAL HIGH (ref 11.5–15.5)
WBC: 5.6 10*3/uL (ref 4.0–10.5)
nRBC: 0 % (ref 0.0–0.2)

## 2020-04-17 LAB — URINE CULTURE

## 2020-04-17 LAB — CEA: CEA: 14.1 ng/mL — ABNORMAL HIGH (ref 0.0–4.7)

## 2020-04-17 LAB — H. PYLORI ANTIBODY, IGG: H Pylori IgG: 3.83 Index Value — ABNORMAL HIGH (ref 0.00–0.79)

## 2020-04-17 MED ORDER — LORAZEPAM 2 MG/ML IJ SOLN
0.5000 mg | Freq: Once | INTRAMUSCULAR | Status: AC
  Administered 2020-04-17: 0.5 mg via INTRAVENOUS
  Filled 2020-04-17: qty 1

## 2020-04-17 MED ORDER — PROCHLORPERAZINE EDISYLATE 10 MG/2ML IJ SOLN
7.5000 mg | Freq: Once | INTRAMUSCULAR | Status: AC
  Administered 2020-04-17: 7.5 mg via INTRAVENOUS
  Filled 2020-04-17: qty 2

## 2020-04-17 MED ORDER — FLEET ENEMA 7-19 GM/118ML RE ENEM
1.0000 | ENEMA | RECTAL | Status: AC
  Administered 2020-04-17 (×2): 1 via RECTAL
  Filled 2020-04-17 (×2): qty 1

## 2020-04-17 MED ORDER — LIDOCAINE VISCOUS HCL 2 % MT SOLN
15.0000 mL | Freq: Once | OROMUCOSAL | Status: AC
  Filled 2020-04-17: qty 15

## 2020-04-17 MED ORDER — INSULIN ASPART 100 UNIT/ML ~~LOC~~ SOLN
0.0000 [IU] | SUBCUTANEOUS | Status: DC
  Administered 2020-04-17: 3 [IU] via SUBCUTANEOUS
  Administered 2020-04-20: 7 [IU] via SUBCUTANEOUS
  Administered 2020-04-20 – 2020-04-21 (×4): 3 [IU] via SUBCUTANEOUS
  Administered 2020-04-21: 4 [IU] via SUBCUTANEOUS
  Administered 2020-04-21 (×2): 3 [IU] via SUBCUTANEOUS
  Administered 2020-04-22: 4 [IU] via SUBCUTANEOUS
  Administered 2020-04-22 (×2): 3 [IU] via SUBCUTANEOUS
  Administered 2020-04-23: 4 [IU] via SUBCUTANEOUS
  Administered 2020-04-23 – 2020-04-24 (×4): 3 [IU] via SUBCUTANEOUS
  Administered 2020-04-24: 4 [IU] via SUBCUTANEOUS
  Administered 2020-04-24 (×2): 3 [IU] via SUBCUTANEOUS
  Administered 2020-04-25: 7 [IU] via SUBCUTANEOUS
  Administered 2020-04-25 – 2020-04-26 (×5): 3 [IU] via SUBCUTANEOUS
  Administered 2020-04-26 – 2020-04-28 (×4): 4 [IU] via SUBCUTANEOUS
  Administered 2020-04-28 – 2020-04-29 (×5): 3 [IU] via SUBCUTANEOUS
  Administered 2020-04-29 (×2): 4 [IU] via SUBCUTANEOUS
  Administered 2020-04-29: 3 [IU] via SUBCUTANEOUS

## 2020-04-17 MED ORDER — POTASSIUM CHLORIDE 10 MEQ/100ML IV SOLN
10.0000 meq | INTRAVENOUS | Status: AC
  Administered 2020-04-17 – 2020-04-18 (×5): 10 meq via INTRAVENOUS
  Filled 2020-04-17 (×3): qty 100

## 2020-04-17 MED ORDER — LIDOCAINE VISCOUS HCL 2 % MT SOLN
OROMUCOSAL | Status: AC
  Administered 2020-04-17: 6 mL via OROMUCOSAL
  Filled 2020-04-17: qty 15

## 2020-04-17 MED ORDER — CALCIUM GLUCONATE-NACL 2-0.675 GM/100ML-% IV SOLN
2.0000 g | Freq: Once | INTRAVENOUS | Status: AC
  Administered 2020-04-17: 2000 mg via INTRAVENOUS
  Filled 2020-04-17: qty 100

## 2020-04-17 NOTE — H&P (View-Only) (Signed)
[facility] Consult: 9:00 AM 04/17/2020  LOS: 1 day    Referring Provider: Dr Sherral Hammers  Primary Care Physician:  [name], MD at [facility] Primary Gastroenterologist:  unassigned   Reason for Consultation:  Sigmoid obstruction   HPI: Bailey Walker is a 74 y.o. female.  PMH NIIDM.  Hypothyroidism.  Osteoporosis.  Spinal DJD.  SVT.  Iron deficiency and B12 deficiency.  Hgb 7.1 on 01/26/20, has never had transfusion.  Feraheme on 5/21, 5/28, 6/21. IM B12 starting 5/6.  Thrombocytosis.  Hematologic issues followed by Dr. Alvy Bimler. Previous appendectomy No previous colonoscopy, per Dr. Alvy Bimler recent notes pt refused colonoscopy suggestions in the past and reports intermittent hemorrhoidal bleeding, especially when constipated..  Visits to ED 7/4 and 7/26, c/o abd pain, distention, and n/v.  7/4 CTAP W contrast: Fluid-filled small bowel but no frank distention or obstruction.  Fluid-filled, mildly edematous: Possibly reflecting enterocolitis.  Small volume anterior mesenteric fluid, likely reactive.  Stable moderate hiatal hernia.  Thickened rugal folds, correlate for gastritis.  Retroflexed, fibroid uterus.  Aortic atherosclerosis. LFTs, lipase, chemistries on that earlier visit unremarkable other than glucose 190, calcium 8.5. Hgb 13.7.    When she returned to the ED yesterday she had not been able to keep anything down and the vomiting had continued since early in the month.  Pain now 10/10 without aggravating or relieving factor.  Emesis is nonbloody.  7 to 10 pound weight loss.  Has had little to no BM's for a few weeks but some stool overnight.  No enemas, no laxatives.  Emesis is clear or bilious.    04/16/2020 CTAP w contrast: Large volume stool in colon with equalization into the distal/terminal ileum.   Transition point at sigmoid suspicious for carcinoma.  Mildly enlarged regional and IMV lymph nodes.  Focal narrowing of the ascending colon with adjacent mild Lee enlarged lymph nodes, possibly due to under distention or peristalsis but additional mass not excluded. Current chemistries again with normal LFTs with exception of albumin now 3.3..  Potassium 3.4.  BUN and creatinine normal.  Calcium now 7. WBCs normal.  Hgb 11.6 after IVF.    Fm Hx Neg for colorectal cancer.  Her sister recently passed with cancer: lungs, liver, bone  Social Hx Former smoker.  No ETOH.  Immigrated from [place] in [place].  She is a Engineer, manufacturing, originally from [place].  Does not speak english.  Lives w her Niece [name], whom the pt raised.       Past Medical History:  Diagnosis Date  . Diabetes mellitus without complication ([place])   . Hypertension   . SVT (supraventricular tachycardia) ([place])   . Thyroid disease     Past Surgical History:  Procedure Laterality Date  . APPENDECTOMY    . BREAST SURGERY     Fatty tissue on biopsy  . EYE SURGERY Bilateral    april and march 2019 for cataracts.   . fatty gland     left wrist , right breast  . KNEE SURGERY     left knee  Prior to Admission medications   Medication Sig Start Date End Date Taking? Authorizing Provider  alendronate (FOSAMAX) 70 MG tablet Take 70 mg by mouth every Sunday.  03/01/20  Yes [provider]  aspirin EC 81 MG tablet Take 81 mg by mouth daily. Swallow whole.   Yes [provider]  denosumab (PROLIA) 60 MG/ML SOSY injection Inject 60 mg into the skin every 6 (six) months.   Yes [provider]  DEXILANT 30 MG capsule TAKE 1 CAPSULE BY MOUTH EVERY DAY Patient taking differently: Take 30 mg by mouth daily.  10/20/19  Yes Tresa Garter, MD  Dextromethorphan-guaiFENesin 10-100 MG/5ML liquid Take 5 mLs by mouth every 12 (twelve) hours as needed (cough).    Yes [provider]  glipiZIDE  (GLUCOTROL) 5 MG tablet TAKE 1 TABLET BY MOUTH TWICE A DAY BEFORE MEALS Patient taking differently: Take 5 mg by mouth See admin instructions. Take one tablet (5 mg) by mouth twice daily - after lunch and at midnight 11/04/19  Yes Dorena Dew, FNP  levothyroxine (SYNTHROID) 75 MCG tablet TAKE 1 TABLET BY MOUTH EVERY DAY Patient taking differently: Take 75 mcg by mouth daily before breakfast.  01/02/20  Yes Dorena Dew, FNP  metFORMIN (GLUCOPHAGE) 500 MG tablet Take 0.5 tablets (250 mg total) by mouth 2 (two) times daily with a meal. Patient taking differently: Take 500 mg by mouth See admin instructions. Take one tablet (500 mg) by mouth twice daily - after lunch and at midnight 05/16/19  Yes [name], FNP  metoprolol tartrate (LOPRESSOR) 25 MG tablet Take 1 tablet (25 mg total) by mouth 2 (two) times daily. 08/02/19  Yes Tresa Garter, MD  nitroGLYCERIN (NITROSTAT) 0.4 MG SL tablet Place 1 tablet (0.4 mg total) under the tongue every 5 (five) minutes as needed for chest pain. 05/16/19 04/16/20 Yes [name], FNP  ondansetron (ZOFRAN) 4 MG tablet Take 1 tablet (4 mg total) by mouth every 8 (eight) hours as needed for nausea or vomiting. 03/25/20  Yes Hayden Rasmussen, MD  prednisoLONE acetate (PRED FORTE) 1 % ophthalmic suspension Place 1 drop into both eyes See admin instructions. Instill one drop into both eyes on Monday, Wednesday, Friday nights   Yes [provider]  promethazine (PHENERGAN) 12.5 MG suppository Place 2 suppositories (25 mg total) rectally every 6 (six) hours as needed for nausea or vomiting. Patient not taking: Reported on 04/16/2020 03/25/20   Hayden Rasmussen, MD    Scheduled Meds: . aspirin EC  81 mg Oral Daily  . enoxaparin (LOVENOX) injection  40 mg Subcutaneous Q24H  . insulin aspart  0-15 Units Subcutaneous TID WC  . insulin aspart  0-5 Units Subcutaneous QHS  . levothyroxine  75 mcg Oral QAC breakfast  . pantoprazole  40 mg Oral Daily    . sodium chloride flush  3 mL Intravenous Once   Infusions: . sodium chloride 75 mL/hr at 04/16/20 1642  . dextrose 5 % and 0.45% NaCl 75 mL/hr at 04/16/20 1258   PRN Meds: acetaminophen **OR** acetaminophen, hydrALAZINE, HYDROmorphone (DILAUDID) injection, ondansetron **OR** ondansetron (ZOFRAN) IV, oxyCODONE   Allergies as of 04/15/2020 - Review Complete 04/15/2020  Allergen Reaction Noted  . Nitroglycerin Nausea And Vomiting 01/31/2020  . Ampicillin Other (See Comments) 12/29/2016    Family History  Problem Relation Age of Onset  . Breast cancer Cousin        mat and pat sides  . Cancer Sister  cancer everywhere  . Colon cancer Neg Hx   . Esophageal cancer Neg Hx   . Rectal cancer Neg Hx     Social History   Socioeconomic History  . Marital status: Single    Spouse name: Not on file  . Number of children: 0  . Years of education: Not on file  . Highest education level: Not on file  Occupational History  . Occupation: retired  Tobacco Use  . Smoking status: Former Smoker    Quit date: 12/29/2013    Years since quitting: 6.3  . Smokeless tobacco: Never Used  Vaping Use  . Vaping Use: Never used  Substance and Sexual Activity  . Alcohol use: No  . Drug use: No  . Sexual activity: Not on file  Other Topics Concern  . Not on file  Social History Narrative  . Not on file   Social Determinants of Health   Financial Resource Strain:   . Difficulty of Paying Living Expenses:   Food Insecurity:   . Worried About Charity fundraiser in the Last Year:   . Arboriculturist in the Last Year:   Transportation Needs:   . Film/video editor (Medical):   Marland Kitchen Lack of Transportation (Non-Medical):   Physical Activity:   . Days of Exercise per Week:   . Minutes of Exercise per Session:   Stress:   . Feeling of Stress :   Social Connections:   . Frequency of Communication with Friends and Family:   . Frequency of Social Gatherings with Friends and Family:    . Attends Religious Services:   . Active Member of Clubs or Organizations:   . Attends Archivist Meetings:   Marland Kitchen Marital Status:   Intimate Partner Violence:   . Fear of Current or Ex-Partner:   . Emotionally Abused:   Marland Kitchen Physically Abused:   . Sexually Abused:     REVIEW OF SYSTEMS: Constitutional:  + weakness, + dizziness.  No syncope ENT:  No nose bleeds Pulm:  No SOB or cough CV:  No palpitations, no LE edema.  occ non-exertional CP GU:  No hematuria, no frequency GI:  See HPI Heme:  No unusual bleeding or bruising   Transfusions:  none Neuro:  No headaches, no peripheral tingling or numbness Derm:  No itching, no rash or sores.  Endocrine:  No sweats or chills.  No polyuria or dysuria Immunization:  No Covid 19 vax.    PHYSICAL EXAM: Vital signs in last 24 hours: Vitals:   04/17/20 0103 04/17/20 0326  BP: (!) 132/66 (!) 156/75  Pulse: (!) 107 104  Resp: 18 18  Temp: 98.3 F (36.8 C) 98.2 F (36.8 C)  SpO2: 97% 98%   Wt Readings from Last 3 Encounters:  04/16/20 72.6 kg  03/25/20 75.1 kg  03/12/20 74.9 kg    General: looks moderately ill.  Sleepy but arousable.   Head: No facial asymmetry or swelling.  No signs of head trauma. Eyes: No scleral icterus.  No conjunctival pallor. Ears: Not hard of hearing Nose: No congestion or discharge Mouth: Edentulous.  Mucosa moist, pink, clear.  Tongue midline. Neck: No JVD, no masses, no thyromegaly Lungs: Clear bilaterally.  No labored breathing or cough. Heart: Slightly tacky regular rhythm.  S1, S2 present.  No MRG. Abdomen: Distended, tense, tender in the mid abdomen without guarding or rebound.  Bowel sounds absent.  No high-pitched or tinkling/tympanic sounds.  No masses, organomegaly, bruits, hernias appreciated.  Rectal: Deferred Musc/Skeltl: No joint redness, swelling or gross deformities. Extremities: No CCE. Neurologic: Alert.  No involuntary movements.  Moves all 4 limbs, strength not tested.   Not disoriented. Skin: No rash, no sores, no suspicious lesions. Nodes: No cervical adenopathy Psych: Calm, flat/depressed affect in this sleepy pt  Intake/Output from previous day: 07/26 0701 - 07/27 0700 In: 0  Out: 100 [Urine:100] Intake/Output this shift: No intake/output data recorded.  LAB RESULTS: Recent Labs    04/16/20 0042 04/16/20 1559 04/17/20 0209  WBC 8.3 5.9 5.6  HGB 13.0 12.2 11.6*  HCT 40.7 38.3 37.8  PLT 316 310 276   BMET Lab Results  Component Value Date   NA 137 04/17/2020   NA 137 04/16/2020   NA 135 03/25/2020   K 3.4 (L) 04/17/2020   K 3.6 04/16/2020   K 4.6 03/25/2020   CL 106 04/17/2020   CL 102 04/16/2020   CL 102 03/25/2020   CO2 21 (L) 04/17/2020   CO2 21 (L) 04/16/2020   CO2 17 (L) 03/25/2020   GLUCOSE 122 (H) 04/17/2020   GLUCOSE 142 (H) 04/16/2020   GLUCOSE 190 (H) 03/25/2020   BUN 8 04/17/2020   BUN 10 04/16/2020   BUN 12 03/25/2020   CREATININE 0.59 04/17/2020   CREATININE 0.58 04/16/2020   CREATININE 0.48 04/16/2020   CALCIUM 7.0 (L) 04/17/2020   CALCIUM 7.7 (L) 04/16/2020   CALCIUM 8.5 (L) 03/25/2020   LFT Recent Labs    04/16/20 0042 04/17/20 0209  PROT 7.9 6.3*  ALBUMIN 4.3 3.3*  AST 16 10*  ALT 11 9  ALKPHOS 58 47  BILITOT 0.5 0.7   PT/INR No results found for: INR, PROTIME Hepatitis Panel No results for input(s): HEPBSAG, HCVAB, HEPAIGM, HEPBIGM in the last 72 hours. C-Diff No components found for: CDIFF Lipase     Component Value Date/Time   LIPASE 19 04/16/2020 0042    Drugs of Abuse  No results found for: LABOPIA, COCAINSCRNUR, LABBENZ, AMPHETMU, THCU, LABBARB   RADIOLOGY STUDIES: CT Abdomen Pelvis W Contrast  Result Date: 04/16/2020 CLINICAL DATA:  Abdominal pain.  Emesis. EXAM: CT ABDOMEN AND PELVIS WITH CONTRAST TECHNIQUE: Multidetector CT imaging of the abdomen and pelvis was performed using the standard protocol following bolus administration of intravenous contrast. CONTRAST:  197mL  OMNIPAQUE IOHEXOL 300 MG/ML  SOLN COMPARISON:  March 25, 2020 FINDINGS: Lower chest: The lung bases are clear. The heart is enlarged. Hepatobiliary: The liver is normal. Normal gallbladder.There is no biliary ductal dilation. Pancreas: Normal contours without ductal dilatation. No peripancreatic fluid collection. Spleen: Unremarkable. Adrenals/Urinary Tract: --Adrenal glands: Unremarkable. --Right kidney/ureter: No hydronephrosis or radiopaque kidney stones. --Left kidney/ureter: No hydronephrosis or radiopaque kidney stones. --Urinary bladder: Unremarkable. Stomach/Bowel: --Stomach/Duodenum: There is a small to moderate-sized hiatal hernia. Again noted are thickened rugal folds similar to prior study. --Small bowel: There are dilated loops of small bowel involving the distal and terminal ileum. These loops of small bowel demonstrate fecalization consistent with slow transit. --Colon: . There is significant distention of the colon which has progressed since the prior study. There is a large amount of stool in the colon. There is an apparent transition point in the sigmoid colon (axial series 2, image 73). At this level, there is wall thickening of the sigmoid colon. There are surrounding enlarged regional lymph nodes. There is an additional focal area of narrowing involving the ascending colon (axial series 2, image 45). There are adjacent mildly enlarged regional lymph nodes. --Appendix: Not visualized.  No right lower quadrant inflammation or free fluid. Vascular/Lymphatic: Atherosclerotic calcification is present within the non-aneurysmal abdominal aorta, without hemodynamically significant stenosis. --No retroperitoneal lymphadenopathy. --there are enlarged regional lymph nodes about the sigmoid colon. There are enlarged lymph nodes along the course of the IMV (axial series 2, image 58). These lymph nodes measure up to approximately 0.9 cm. --No pelvic or inguinal lymphadenopathy. Reproductive: Unremarkable Other:  No ascites or free air. The abdominal wall is normal. Musculoskeletal. There is degenerative disc disease throughout the lumbar spine. There is a posterior disc osteophyte complex at the L4-L5 level resulting in spinal canal stenosis. There is no acute displaced fracture. IMPRESSION: 1. Large amount of stool in the colon with fecalization of the distal and terminal ileum. 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 There are mildly enlarged regional and IMV lymph nodes concerning for nodal metastatic disease. Surgical consultation is recommended. 2. Additional focal area of narrowing involving the ascending colon with adjacent mildly enlarged lymph nodes as detailed above. While this may be secondary to underdistention or peristalsis, an additional mass is not excluded. 3. No free air free fluid. Additional chronic findings as detailed above Aortic Atherosclerosis (ICD10-I70.0). These results were called by telephone at the time of interpretation on 04/16/2020 at 2:22 am to provider [facility] , who verbally acknowledged these results. Electronically Signed   By: Constance Holster M.D.   On: 04/16/2020 02:24      IMPRESSION:   *   Sigmoid obstruction.  R/o neoplasia.  R/o IBD w stricture.    *   B12 and IDA.  Dr Alvy Bimler managing and treating w Feraheme, IM B12.  No longer anemic.    *   Hypocalcemia.  Level corrected for albumin is 7.4.    *   DM2.  On oral agents.    PLAN:     *   Colonoscopy if able to prep pt, O/w Flex Sig.    *   NGT to LIS??   *   Fleets enemas, ordered   Azucena Freed  04/17/2020, 9:00 AM Phone [phone]

## 2020-04-17 NOTE — Progress Notes (Signed)
PROGRESS NOTE    Bailey Walker  ZOX:[id number] DOB: [birth year] DOA: 04/15/2020 PCP: [name], MD     Brief Narrative:  Bailey Walker is a 74 y.o. female (speaks Arabic) PMHx HTN, SVT, DM type II controlled with complication, hypothyroidism, obesity   Presents to emergency department with abdominal pain and vomiting since 2 days.  History gathered from patient's daughter at the bedside.  She mentioned that patient has severe generalized abdominal pain and vomiting since July 4 however it got worse since 2 days.  She could not keep anything down, she has 10 out of 10, nonradiating, sharp pain, no aggravating or relieving factors, associated with vomiting 4 times since yesterday.  Vomitus is nonbloody.  She has lost about 7 to 10 pounds since July for the due to abdominal pain and vomiting.  No history of colon cancer in the family.  No night sweats, melena, fever, chills, headache, blurry vision, chest pain, shortness of breath, palpitation, leg swelling.  Reports dysuria since few days.  No foul-smelling or change in urinary frequency, lower back pain.  She had regular bowel movement this morning.  She is a former smoker.  No history of alcohol, illicit drug use.  ED Course: Upon arrival to ED: Patient's vital signs stable.  She received IV fluid bolus, Protonix, Zofran, Dilaudid as needed for pain control and Rocephin for possible UTI in ED.  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  Patient transferred to [facility] for further evaluation and management.    Subjective: 7/27 translation by niece A/O x4, negative CP, negative S OB, positive RUQ abdominal pain.  Negative nausea/vomiting.  States 2 days ago positive nausea/vomiting.  Negative melena.   Assessment & Plan: Covid vaccination; negative vaccination   Principal Problem:   Colon obstruction ([place]) Active Problems:   Hypothyroidism   Essential  hypertension   Diabetes mellitus due to underlying condition with unspecified complications (HCC)   UTI (urinary tract infection)   Hypokalemia -Potassium goal> 4 -Potassium IV 50 mEq  Hypocalcemia -Corrected calcium= 7.7 -Calcium gluconate 2 g x 1  N.p.o. -D5-0.9% saline 28ml/hr  DM type II controlled without complication -8/11 hemoglobin A1c= 5.3 -Resistant SSI   Abdominal pain with vomiting: -7/27GI will attempt Colonoscopy on 7/28 if unsuccessful then [facility] will perform surgery  UTI:  -continue 5 day course Empiric ABX  Hypertension:  -resolved - Hypothyroidism:  -TSH pending -Synthroid 75 mcg daily  DM Type 2 controlled without complications -9/14 Hemoglobin A1C=5.3 -Resistant SSI  Iron deficiency anemia:  Recent Labs  Lab 04/16/20 0042 04/16/20 1559 04/17/20 0209 04/18/20 0447  HGB 13.0 12.2 11.6* 12.2  -HGB stable -Continue ferrous sulfate -Followed by hematology outpatient Dr. Alvy Bimler  Please note: As per RN patient was complaining of pressure and swelling when she voids.  I did pelvic exam: She has about 1.5 cm polyp/cyst in vagina.  Patient tells me that she has the swelling since many months however she did not tell anyone about it.  She is not sexually active, never married, G0 P0.  Denies vaginal discharge.  Unsure about last Pap smear. -I discussed with patient's niece that patient needs to follow-up with OB/GYN outpatient and she verbalized understanding.   DVT prophylaxis: Lovenox Code Status: Full Family Communication: 7/27 Niece present at Bedside Status is: Inpatient    Dispo: The patient is from:               Anticipated d/c is to:  Anticipated d/c date is:               Patient currently unstable      Consultants:  [facility]  Procedures/Significant Events:    I have personally reviewed and interpreted all radiology studies and my findings are as above.  VENTILATOR  SETTINGS:    Cultures   Antimicrobials:    Devices    LINES / TUBES:      Continuous Infusions:  sodium chloride 75 mL/hr at 04/16/20 1642   dextrose 5 % and 0.45% NaCl 75 mL/hr at 04/16/20 1258     Objective: Vitals:   04/16/20 1805 04/16/20 2010 04/17/20 0103 04/17/20 0326  BP:  (!) 150/87 (!) 132/66 (!) 156/75  Pulse:  96 (!) 107 104  Resp:  18 18 18   Temp:  98.3 F (36.8 C) 98.3 F (36.8 C) 98.2 F (36.8 C)  TempSrc:      SpO2:  100% 97% 98%  Weight: 72.6 kg     Height: 5\' 1"  (1.549 m)       Intake/Output Summary (Last 24 hours) at 04/17/2020 4174 Last data filed at 04/16/2020 1843 Gross per 24 hour  Intake 0 ml  Output 100 ml  Net -100 ml   Filed Weights   04/15/20 1956 04/16/20 1805  Weight: 72.9 kg 72.6 kg    Examination: General: A/O x4, No acute respiratory distress Eyes: negative scleral hemorrhage, negative anisocoria, negative icterus ENT: Negative Runny nose, negative gingival bleeding, Neck:  Negative scars, masses, torticollis, lymphadenopathy, JVD Lungs: Clear to auscultation bilaterally without wheezes or crackles Cardiovascular: Regular rate and rhythm without murmur gallop or rub normal S1 and S2 Abdomen: Positive RUQ/epigastric abdominal pain to palpation, nondistended, positive soft, bowel sounds, no rebound, no ascites, no appreciable mass Extremities: No significant cyanosis, clubbing, or edema bilateral lower extremities Skin: Negative rashes, lesions, ulcers Psychiatric:  Negative depression, negative anxiety, negative fatigue, negative mania  Central nervous system:  Cranial nerves II through XII intact, tongue/uvula midline, all extremities muscle strength 5/5, sensation intact throughout,  negative dysarthria, negative expressive aphasia, negative receptive aphasia.  .     Data Reviewed: Care during the described time interval was provided by me .  I have reviewed this patient's available data, including medical  history, events of note, physical examination, and all test results as part of my evaluation.  CBC: Recent Labs  Lab 04/16/20 0042 04/16/20 1559 04/17/20 0209  WBC 8.3 5.9 5.6  HGB 13.0 12.2 11.6*  HCT 40.7 38.3 37.8  MCV 89.1 89.3 90.6  PLT 316 310 081   Basic Metabolic Panel: Recent Labs  Lab 04/16/20 0042 04/16/20 1559 04/17/20 0209  NA 137  --  137  K 3.6  --  3.4*  CL 102  --  106  CO2 21*  --  21*  GLUCOSE 142*  --  122*  BUN 10  --  8  CREATININE 0.48 0.58 0.59  CALCIUM 7.7*  --  7.0*  MG  --  1.9  --    GFR: Estimated Creatinine Clearance: 54.5 mL/min (by C-G formula based on SCr of 0.59 mg/dL). Liver Function Tests: Recent Labs  Lab 04/16/20 0042 04/17/20 0209  AST 16 10*  ALT 11 9  ALKPHOS 58 47  BILITOT 0.5 0.7  PROT 7.9 6.3*  ALBUMIN 4.3 3.3*   Recent Labs  Lab 04/16/20 0042  LIPASE 19   No results for input(s): AMMONIA in the last 168 hours. Coagulation Profile: No  results for input(s): INR, PROTIME in the last 168 hours. Cardiac Enzymes: No results for input(s): CKTOTAL, CKMB, CKMBINDEX, TROPONINI in the last 168 hours. BNP (last 3 results) No results for input(s): PROBNP in the last 8760 hours. HbA1C: Recent Labs    04/16/20 1605  HGBA1C 5.3   CBG: Recent Labs  Lab 04/16/20 1650 04/16/20 2012 04/16/20 2358 04/17/20 0327 04/17/20 0806  GLUCAP 124* 110* 114* 114* 122*   Lipid Profile: No results for input(s): CHOL, HDL, LDLCALC, TRIG, CHOLHDL, LDLDIRECT in the last 72 hours. Thyroid Function Tests: No results for input(s): TSH, T4TOTAL, FREET4, T3FREE, THYROIDAB in the last 72 hours. Anemia Panel: No results for input(s): VITAMINB12, FOLATE, FERRITIN, TIBC, IRON, RETICCTPCT in the last 72 hours. Sepsis Labs: No results for input(s): PROCALCITON, LATICACIDVEN in the last 168 hours.  Recent Results (from the past 240 hour(s))  SARS Coronavirus 2 by RT PCR (hospital order, performed in [facility] hospital lab) Nasopharyngeal  Nasopharyngeal Swab     Status: None   Collection Time: 04/16/20  4:27 AM   Specimen: Nasopharyngeal Swab  Result Value Ref Range Status   SARS Coronavirus 2 NEGATIVE NEGATIVE Final    Comment: (NOTE) SARS-CoV-2 target nucleic acids are NOT DETECTED.  The SARS-CoV-2 RNA is generally detectable in upper and lower respiratory specimens during the acute phase of infection. The lowest concentration of SARS-CoV-2 viral copies this assay can detect is 250 copies / mL. A negative result does not preclude SARS-CoV-2 infection and should not be used as the sole basis for treatment or other patient management decisions.  A negative result may occur with improper specimen collection / handling, submission of specimen other than nasopharyngeal swab, presence of viral mutation(s) within the areas targeted by this assay, and inadequate number of viral copies (<250 copies / mL). A negative result must be combined with clinical observations, patient history, and epidemiological information.  Fact Sheet for Patients:   StrictlyIdeas.no  Fact Sheet for Healthcare Providers: BankingDealers.co.za  This test is not yet approved or  cleared by the [place] FDA and has been authorized for detection and/or diagnosis of SARS-CoV-2 by FDA under an Emergency Use Authorization (EUA).  This EUA will remain in effect (meaning this test can be used) for the duration of the COVID-19 declaration under Section 564(b)(1) of the Act, 21 U.S.C. section 360bbb-3(b)(1), unless the authorization is terminated or revoked sooner.  Performed at [facility], [address], [place], [place] [address]          Radiology Studies: CT Abdomen Pelvis W Contrast  Result Date: 04/16/2020 CLINICAL DATA:  Abdominal pain.  Emesis. EXAM: CT ABDOMEN AND PELVIS WITH CONTRAST TECHNIQUE: Multidetector CT imaging of the abdomen and pelvis was performed using the  standard protocol following bolus administration of intravenous contrast. CONTRAST:  163mL OMNIPAQUE IOHEXOL 300 MG/ML  SOLN COMPARISON:  March 25, 2020 FINDINGS: Lower chest: The lung bases are clear. The heart is enlarged. Hepatobiliary: The liver is normal. Normal gallbladder.There is no biliary ductal dilation. Pancreas: Normal contours without ductal dilatation. No peripancreatic fluid collection. Spleen: Unremarkable. Adrenals/Urinary Tract: --Adrenal glands: Unremarkable. --Right kidney/ureter: No hydronephrosis or radiopaque kidney stones. --Left kidney/ureter: No hydronephrosis or radiopaque kidney stones. --Urinary bladder: Unremarkable. Stomach/Bowel: --Stomach/Duodenum: There is a small to moderate-sized hiatal hernia. Again noted are thickened rugal folds similar to prior study. --Small bowel: There are dilated loops of small bowel involving the distal and terminal ileum. These loops of small bowel demonstrate fecalization consistent with  slow transit. --Colon: . There is significant distention of the colon which has progressed since the prior study. There is a large amount of stool in the colon. There is an apparent transition point in the sigmoid colon (axial series 2, image 73). At this level, there is wall thickening of the sigmoid colon. There are surrounding enlarged regional lymph nodes. There is an additional focal area of narrowing involving the ascending colon (axial series 2, image 45). There are adjacent mildly enlarged regional lymph nodes. --Appendix: Not visualized. No right lower quadrant inflammation or free fluid. Vascular/Lymphatic: Atherosclerotic calcification is present within the non-aneurysmal abdominal aorta, without hemodynamically significant stenosis. --No retroperitoneal lymphadenopathy. --there are enlarged regional lymph nodes about the sigmoid colon. There are enlarged lymph nodes along the course of the IMV (axial series 2, image 58). These lymph nodes measure up to  approximately 0.9 cm. --No pelvic or inguinal lymphadenopathy. Reproductive: Unremarkable Other: No ascites or free air. The abdominal wall is normal. Musculoskeletal. There is degenerative disc disease throughout the lumbar spine. There is a posterior disc osteophyte complex at the L4-L5 level resulting in spinal canal stenosis. There is no acute displaced fracture. IMPRESSION: 1. Large amount of stool in the colon with fecalization of the distal and terminal ileum. 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 There are mildly enlarged regional and IMV lymph nodes concerning for nodal metastatic disease. Surgical consultation is recommended. 2. Additional focal area of narrowing involving the ascending colon with adjacent mildly enlarged lymph nodes as detailed above. While this may be secondary to underdistention or peristalsis, an additional mass is not excluded. 3. No free air free fluid. Additional chronic findings as detailed above Aortic Atherosclerosis (ICD10-I70.0). These results were called by telephone at the time of interpretation on 04/16/2020 at 2:22 am to provider [facility] , who verbally acknowledged these results. Electronically Signed   By: Constance Holster M.D.   On: 04/16/2020 02:24        Scheduled Meds:  aspirin EC  81 mg Oral Daily   enoxaparin (LOVENOX) injection  40 mg Subcutaneous Q24H   insulin aspart  0-15 Units Subcutaneous TID WC   insulin aspart  0-5 Units Subcutaneous QHS   levothyroxine  75 mcg Oral QAC breakfast   pantoprazole  40 mg Oral Daily   sodium chloride flush  3 mL Intravenous Once   Continuous Infusions:  sodium chloride 75 mL/hr at 04/16/20 1642   dextrose 5 % and 0.45% NaCl 75 mL/hr at 04/16/20 1258     LOS: 1 day    Time spent:40 min    Kamani Lewter, Geraldo Docker, MD [facility] Pager [phone]  If 7PM-7AM, please  contact night-coverage [email or web address] Password [email or web address] 04/17/2020, 8:11 AM

## 2020-04-17 NOTE — Progress Notes (Signed)
Initial Nutrition Assessment  RD working remotely.  DOCUMENTATION CODES:   Obesity unspecified  INTERVENTION:   -RD will follow for diet advancement and add supplements as appropriate   NUTRITION DIAGNOSIS:   Inadequate oral intake related to altered GI function as evidenced by NPO status.  GOAL:   Patient will meet greater than or equal to 90% of their needs  MONITOR:   Diet advancement, Labs, Weight trends, Skin, I & O's  REASON FOR ASSESSMENT:   Malnutrition Screening Tool    ASSESSMENT:   Bailey Walker is a 74 y.o. female with medical history significant of hypertension, type 2 diabetes mellitus, hypothyroidism, obesity presents to emergency department with abdominal pain and vomiting since 2 days  Pt admitted with abdominal pain and vomiting likely secondary to underlying obstructive colorectal carcinoma.   Reviewed I/O's: -100 ml x 24 hourd and +900 ml since admission  UOP: 100 ml x 24 hours  Attempted to speak with pt via hospital room phone, however, no answer.   Per RN notes, pt refusing NGT placement. GI recommending colonoscopy vs flex sig.   Pt currently NPO.   Reviewed wt hx; pt has experienced a 8% wt loss over the past year, which is not significant for time frame.   Medications reviewed and include lovenox, ativan, and fleet enema.  Lab Results  Component Value Date   HGBA1C 5.3 04/16/2020   PTA DM medications are 5 mg glipizide BID and 250 mg metformin BID.   Labs reviewed: K: 3.4, CBGS: 114 (inpatient orders for glycemic control are 0-15 units insulin aspart TID with meals and 0-5 units insulin aspart daily at bedtime).   Diet Order:   Diet Order            Diet NPO time specified  Diet effective now                 EDUCATION NEEDS:   No education needs have been identified at this time  Skin:  Skin Assessment: Reviewed RN Assessment  Last BM:  04/16/20  Height:   Ht Readings from Last 1 Encounters:  04/16/20 5\' 1"   (1.549 m)    Weight:   Wt Readings from Last 1 Encounters:  04/16/20 72.6 kg    Ideal Body Weight:  47.7 kg  BMI:  Body mass index is 30.23 kg/m.  Estimated Nutritional Needs:   Kcal:  1600-1800  Protein:  85-100 grams  Fluid:  > 1.6 L    Loistine Chance, RD, LDN, [place] Registered Dietitian II Certified Diabetes Care and Education Specialist Please refer to [facility] for RD and/or RD on-call/weekend/after hours pager

## 2020-04-17 NOTE — Progress Notes (Signed)
Attempted to place NGT x 2 without success.  Patient pushed this RN and NT assisting away and stated, " I would rather throw up" per niece in room.  Claiborne Billings, [place] notified.  Eliezer [place]

## 2020-04-17 NOTE — Consult Note (Signed)
[facility] Surgery Consult Note  Bailey Walker Floor 04-10-1946  [id number].    Requesting MD: Doristine Bosworth Chief Complaint/Reason for Consult: partial LBO HPI:  Patient is a 74 year old female who presented to [facility] with 3 week hx of abdominal pain. She was seen initially on 7/4 with upper abdominal pain and nausea and vomiting. CT scan at that time showed possible enterocolitis and patient was given antiemetic and sent home. Nausea and vomiting resolved but pain remained. Then 3 days ago patient started having nausea and vomiting again. Repeat CT yesterday concerning for possible colorectal mass in sigmoid colon with some narrowing of ascending colon as well. She was not having much bowel function at home but since being here and getting fluids she has started to have BMs again. She is still vomiting bilious material. Stools are not loose but are dark. She has lost around 10 lbs since all this started. Patient has never had a colonoscopy. She used to smoke heavily but stopped in 2013. Does not drink alcohol or use illicit drugs. PMH otherwise significant for HTN, T2DM, hypothyroidism. Past abdominal surgery includes appendectomy. No blood thinning medications other than 81 mg ASA. Allergic to ampicillin and intolerant of SL nitroglycerin. She speaks arabic and her niece assisted translating.   ROS: Review of Systems  Constitutional: Positive for weight loss. Negative for chills and fever.  Respiratory: Negative for shortness of breath and wheezing.   Cardiovascular: Negative for chest pain and palpitations.  Gastrointestinal: Positive for abdominal pain, constipation, melena, nausea and vomiting. Negative for blood in stool and diarrhea.  Genitourinary: Negative for dysuria, frequency and urgency.  All other systems reviewed and are negative.   Family History  Problem Relation Age of Onset  . Breast cancer Cousin        mat and pat sides  . Cancer Sister        cancer everywhere  . Colon  cancer Neg Hx   . Esophageal cancer Neg Hx   . Rectal cancer Neg Hx     Past Medical History:  Diagnosis Date  . Diabetes mellitus without complication ([place])   . Hypertension   . SVT (supraventricular tachycardia) ([place])   . Thyroid disease     Past Surgical History:  Procedure Laterality Date  . APPENDECTOMY    . BREAST SURGERY     Fatty tissue on biopsy  . EYE SURGERY Bilateral    april and march 2019 for cataracts.   . fatty gland     left wrist , right breast  . KNEE SURGERY     left knee    Social History:  reports that she quit smoking about 6 years ago. She has never used smokeless tobacco. She reports that she does not drink alcohol and does not use drugs.  Allergies:  Allergies  Allergen Reactions  . Nitroglycerin Nausea And Vomiting  . Ampicillin Other (See Comments)    Per allergy test    Medications Prior to Admission  Medication Sig Dispense Refill  . alendronate (FOSAMAX) 70 MG tablet Take 70 mg by mouth every Sunday.     Marland Kitchen aspirin EC 81 MG tablet Take 81 mg by mouth daily. Swallow whole.    . denosumab (PROLIA) 60 MG/ML SOSY injection Inject 60 mg into the skin every 6 (six) months.    . DEXILANT 30 MG capsule TAKE 1 CAPSULE BY MOUTH EVERY DAY (Patient taking differently: Take 30 mg by mouth daily. ) 30 capsule 5  . Dextromethorphan-guaiFENesin 10-100  MG/5ML liquid Take 5 mLs by mouth every 12 (twelve) hours as needed (cough).     Marland Kitchen glipiZIDE (GLUCOTROL) 5 MG tablet TAKE 1 TABLET BY MOUTH TWICE A DAY BEFORE MEALS (Patient taking differently: Take 5 mg by mouth See admin instructions. Take one tablet (5 mg) by mouth twice daily - after lunch and at midnight) 60 tablet 3  . levothyroxine (SYNTHROID) 75 MCG tablet TAKE 1 TABLET BY MOUTH EVERY DAY (Patient taking differently: Take 75 mcg by mouth daily before breakfast. ) 30 tablet 0  . metFORMIN (GLUCOPHAGE) 500 MG tablet Take 0.5 tablets (250 mg total) by mouth 2 (two) times daily with a meal. (Patient taking  differently: Take 500 mg by mouth See admin instructions. Take one tablet (500 mg) by mouth twice daily - after lunch and at midnight) 90 tablet 3  . metoprolol tartrate (LOPRESSOR) 25 MG tablet Take 1 tablet (25 mg total) by mouth 2 (two) times daily. 180 tablet 3  . nitroGLYCERIN (NITROSTAT) 0.4 MG SL tablet Place 1 tablet (0.4 mg total) under the tongue every 5 (five) minutes as needed for chest pain. 11 tablet 6  . ondansetron (ZOFRAN) 4 MG tablet Take 1 tablet (4 mg total) by mouth every 8 (eight) hours as needed for nausea or vomiting. 20 tablet 0  . prednisoLONE acetate (PRED FORTE) 1 % ophthalmic suspension Place 1 drop into both eyes See admin instructions. Instill one drop into both eyes on Monday, Wednesday, Friday nights    . promethazine (PHENERGAN) 12.5 MG suppository Place 2 suppositories (25 mg total) rectally every 6 (six) hours as needed for nausea or vomiting. (Patient not taking: Reported on 04/16/2020) 20 suppository 0    Blood pressure (!) 156/75, pulse 104, temperature 98.2 F (36.8 C), resp. rate 18, height 5\' 1"  (1.549 m), weight 72.6 kg, SpO2 98 %. Physical Exam:  General: pleasant, WD, obese female who is laying in bed in NAD HEENT: Sclera are noninjected.  PERRL.  Ears and nose without any masses or lesions.  Mouth is pink and moist Heart: regular, rate, and rhythm. Palpable radial and pedal pulses bilaterally Lungs: CTAB, no wheezes, rhonchi, or rales noted.  Respiratory effort nonlabored Abd: soft, ttp in midepigastrium, distended, +BS,  MS: all 4 extremities are symmetrical with no cyanosis, clubbing, or edema. Skin: warm and dry with no masses, lesions, or rashes Neuro: Cranial nerves 2-12 grossly intact, sensation grossly intact throughout  Psych: A&Ox3 with an appropriate affect.   Results for orders placed or performed during the hospital encounter of 04/15/20 (from the past 48 hour(s))  Urinalysis, Routine w reflex microscopic     Status: Abnormal    Collection Time: 04/16/20 12:13 AM  Result Value Ref Range   Color, Urine YELLOW YELLOW   APPearance CLOUDY (A) CLEAR   Specific Gravity, Urine >1.030 (H) 1.005 - 1.030   pH 6.0 5.0 - 8.0   Glucose, UA NEGATIVE NEGATIVE mg/dL   Hgb urine dipstick SMALL (A) NEGATIVE   Bilirubin Urine SMALL (A) NEGATIVE   Ketones, ur 40 (A) NEGATIVE mg/dL   Protein, ur 30 (A) NEGATIVE mg/dL   Nitrite NEGATIVE NEGATIVE   Leukocytes,Ua MODERATE (A) NEGATIVE    Comment: Performed at [facility], [address] [place], [place] [address]  Urinalysis, Microscopic (reflex)     Status: Abnormal   Collection Time: 04/16/20 12:13 AM  Result Value Ref Range   RBC / HPF 6-10 0 - 5 RBC/hpf   WBC, UA >50  0 - 5 WBC/hpf   Bacteria, UA MANY (A) NONE SEEN   Squamous Epithelial / LPF 6-10 0 - 5   Non Squamous Epithelial PRESENT (A) NONE SEEN   WBC Clumps PRESENT    Mucus PRESENT     Comment: Performed at [facility], [address] [place], [place] [address]  Lipase, blood     Status: None   Collection Time: 04/16/20 12:42 AM  Result Value Ref Range   Lipase 19 11 - 51 U/L    Comment: Performed at [facility], [address] [place], [place] [address]  Comprehensive metabolic panel     Status: Abnormal   Collection Time: 04/16/20 12:42 AM  Result Value Ref Range   Sodium 137 135 - 145 mmol/L   Potassium 3.6 3.5 - 5.1 mmol/L   Chloride 102 98 - 111 mmol/L   CO2 21 (L) 22 - 32 mmol/L   Glucose, Bld 142 (H) 70 - 99 mg/dL    Comment: Glucose reference range applies only to samples taken after fasting for at least 8 hours.   BUN 10 8 - 23 mg/dL   Creatinine, Ser 0.48 0.44 - 1.00 mg/dL   Calcium 7.7 (L) 8.9 - 10.3 mg/dL   Total Protein 7.9 6.5 - 8.1 g/dL   Albumin 4.3 3.5 - 5.0 g/dL   AST 16 15 - 41 U/L   ALT 11 0 - 44 U/L   Alkaline Phosphatase 58 38 - 126 U/L   Total Bilirubin 0.5 0.3 - 1.2 mg/dL   GFR calc non Af Amer >60 >60 mL/min   GFR calc Af Amer >60 >60 mL/min    Anion gap 14 5 - 15    Comment: Performed at [facility], [address] [place], [place] [address]  CBC     Status: Abnormal   Collection Time: 04/16/20 12:42 AM  Result Value Ref Range   WBC 8.3 4.0 - 10.5 K/uL   RBC 4.57 3.87 - 5.11 MIL/uL   Hemoglobin 13.0 12.0 - 15.0 g/dL   HCT 40.7 36 - 46 %   MCV 89.1 80.0 - 100.0 fL   MCH 28.4 26.0 - 34.0 pg   MCHC 31.9 30.0 - 36.0 g/dL   RDW 17.6 (H) 11.5 - 15.5 %   Platelets 316 150 - 400 K/uL   nRBC 0.0 0.0 - 0.2 %    Comment: Performed at [facility], [address], [place], [place] [address]  Urine Culture     Status: Abnormal   Collection Time: 04/16/20  1:13 AM   Specimen: Urine, Random  Result Value Ref Range   Specimen Description      URINE, RANDOM Performed at [facility], [address] [place], [place] [address]    Special Requests      NONE Performed at [facility], [address] [place], [place] [address]    Culture MULTIPLE SPECIES PRESENT, SUGGEST RECOLLECTION (A)    Report Status 04/17/2020 FINAL   SARS Coronavirus 2 by RT PCR (hospital order, performed in [place] hospital lab) Nasopharyngeal Nasopharyngeal Swab     Status: None   Collection Time: 04/16/20  4:27 AM   Specimen: Nasopharyngeal Swab  Result Value Ref Range   SARS Coronavirus 2 NEGATIVE NEGATIVE    Comment: (NOTE) SARS-CoV-2 target nucleic acids are NOT DETECTED.  The SARS-CoV-2 RNA is generally detectable in upper and lower  respiratory specimens during the acute phase of infection. The lowest concentration of SARS-CoV-2 viral copies this assay can detect is 250 copies / mL. A negative result does not preclude SARS-CoV-2 infection and should not be used as the sole basis for treatment or other patient management decisions.  A negative result may occur with improper specimen collection / handling, submission of specimen other than nasopharyngeal swab, presence of viral mutation(s) within  the areas targeted by this assay, and inadequate number of viral copies (<250 copies / mL). A negative result must be combined with clinical observations, patient history, and epidemiological information.  Fact Sheet for Patients:   StrictlyIdeas.no  Fact Sheet for Healthcare Providers: BankingDealers.co.za  This test is not yet approved or  cleared by the [place] FDA and has been authorized for detection and/or diagnosis of SARS-CoV-2 by FDA under an Emergency Use Authorization (EUA).  This EUA will remain in effect (meaning this test can be used) for the duration of the COVID-19 declaration under Section 564(b)(1) of the Act, 21 U.S.C. section 360bbb-3(b)(1), unless the authorization is terminated or revoked sooner.  Performed at [facility], [address] [place], [place] [address]   CBG monitoring, ED     Status: Abnormal   Collection Time: 04/16/20  1:04 PM  Result Value Ref Range   Glucose-Capillary 125 (H) 70 - 99 mg/dL    Comment: Glucose reference range applies only to samples taken after fasting for at least 8 hours.  CBC     Status: Abnormal   Collection Time: 04/16/20  3:59 PM  Result Value Ref Range   WBC 5.9 4.0 - 10.5 K/uL   RBC 4.29 3.87 - 5.11 MIL/uL   Hemoglobin 12.2 12.0 - 15.0 g/dL   HCT 38.3 36 - 46 %   MCV 89.3 80.0 - 100.0 fL   MCH 28.4 26.0 - 34.0 pg   MCHC 31.9 30.0 - 36.0 g/dL   RDW 17.2 (H) 11.5 - 15.5 %   Platelets 310 150 - 400 K/uL   nRBC 0.0 0.0 - 0.2 %    Comment: Performed at [facility], [place] [address] [place], [place] [address]  Creatinine, serum     Status: None   Collection Time: 04/16/20  3:59 PM  Result Value Ref Range   Creatinine, Ser 0.58 0.44 - 1.00 mg/dL   GFR calc non Af Amer >60 >60 mL/min   GFR calc Af Amer >60 >60 mL/min    Comment: Performed at [facility] [address], [place], [place] [address]  Magnesium     Status: None   Collection  Time: 04/16/20  3:59 PM  Result Value Ref Range   Magnesium 1.9 1.7 - 2.4 mg/dL    Comment: Performed at [facility], [place] [address] [place], [place] [address]  Hemoglobin A1c     Status: None   Collection Time: 04/16/20  4:05 PM  Result Value Ref Range   Hgb A1c MFr Bld 5.3 4.8 - 5.6 %    Comment: (NOTE) Pre diabetes:          5.7%-6.4%  Diabetes:              >6.4%  Glycemic control for   <7.0% adults with diabetes    Mean Plasma Glucose 105.41 mg/dL    Comment: Performed at [facility] [address] [place], [place] [address]  Glucose, capillary     Status: Abnormal  Collection Time: 04/16/20  4:50 PM  Result Value Ref Range   Glucose-Capillary 124 (H) 70 - 99 mg/dL    Comment: Glucose reference range applies only to samples taken after fasting for at least 8 hours.  CEA     Status: Abnormal   Collection Time: 04/16/20  6:15 PM  Result Value Ref Range   CEA 14.1 (H) 0.0 - 4.7 ng/mL    Comment: (NOTE)                             Nonsmokers          <3.9                             Smokers             <5.6 [employer] Electrochemiluminescence Immunoassay (ECLIA) Values obtained with different assay methods or kits cannot be used interchangeably.  Results cannot be interpreted as absolute evidence of the presence or absence of malignant disease. Performed At: [facility] [place], [place] [id number] Rush Farmer MD MW:[phone]   Glucose, capillary     Status: Abnormal   Collection Time: 04/16/20  8:12 PM  Result Value Ref Range   Glucose-Capillary 110 (H) 70 - 99 mg/dL    Comment: Glucose reference range applies only to samples taken after fasting for at least 8 hours.  Glucose, capillary     Status: Abnormal   Collection Time: 04/16/20 11:58 PM  Result Value Ref Range   Glucose-Capillary 114 (H) 70 - 99 mg/dL    Comment: Glucose reference range applies only to samples taken after fasting for at least 8 hours.  CBC      Status: Abnormal   Collection Time: 04/17/20  2:09 AM  Result Value Ref Range   WBC 5.6 4.0 - 10.5 K/uL   RBC 4.17 3.87 - 5.11 MIL/uL   Hemoglobin 11.6 (L) 12.0 - 15.0 g/dL   HCT 37.8 36 - 46 %   MCV 90.6 80.0 - 100.0 fL   MCH 27.8 26.0 - 34.0 pg   MCHC 30.7 30.0 - 36.0 g/dL   RDW 17.1 (H) 11.5 - 15.5 %   Platelets 276 150 - 400 K/uL   nRBC 0.0 0.0 - 0.2 %    Comment: Performed at [facility], [place] [address], [place], [place] [address]  Comprehensive metabolic panel     Status: Abnormal   Collection Time: 04/17/20  2:09 AM  Result Value Ref Range   Sodium 137 135 - 145 mmol/L   Potassium 3.4 (L) 3.5 - 5.1 mmol/L   Chloride 106 98 - 111 mmol/L   CO2 21 (L) 22 - 32 mmol/L   Glucose, Bld 122 (H) 70 - 99 mg/dL    Comment: Glucose reference range applies only to samples taken after fasting for at least 8 hours.   BUN 8 8 - 23 mg/dL   Creatinine, Ser 0.59 0.44 - 1.00 mg/dL   Calcium 7.0 (L) 8.9 - 10.3 mg/dL   Total Protein 6.3 (L) 6.5 - 8.1 g/dL   Albumin 3.3 (L) 3.5 - 5.0 g/dL   AST 10 (L) 15 - 41 U/L   ALT 9 0 - 44 U/L   Alkaline Phosphatase 47 38 - 126 U/L   Total Bilirubin 0.7 0.3 - 1.2 mg/dL   GFR calc non Af Amer >60 >60 mL/min  GFR calc Af Amer >60 >60 mL/min   Anion gap 10 5 - 15    Comment: Performed at [facility] [address], [place], [place] [address]  Glucose, capillary     Status: Abnormal   Collection Time: 04/17/20  3:27 AM  Result Value Ref Range   Glucose-Capillary 114 (H) 70 - 99 mg/dL    Comment: Glucose reference range applies only to samples taken after fasting for at least 8 hours.  Glucose, capillary     Status: Abnormal   Collection Time: 04/17/20  8:06 AM  Result Value Ref Range   Glucose-Capillary 122 (H) 70 - 99 mg/dL    Comment: Glucose reference range applies only to samples taken after fasting for at least 8 hours.   CT Abdomen Pelvis W Contrast  Result Date: 04/16/2020 CLINICAL DATA:  Abdominal pain.  Emesis. EXAM: CT  ABDOMEN AND PELVIS WITH CONTRAST TECHNIQUE: Multidetector CT imaging of the abdomen and pelvis was performed using the standard protocol following bolus administration of intravenous contrast. CONTRAST:  165mL OMNIPAQUE IOHEXOL 300 MG/ML  SOLN COMPARISON:  March 25, 2020 FINDINGS: Lower chest: The lung bases are clear. The heart is enlarged. Hepatobiliary: The liver is normal. Normal gallbladder.There is no biliary ductal dilation. Pancreas: Normal contours without ductal dilatation. No peripancreatic fluid collection. Spleen: Unremarkable. Adrenals/Urinary Tract: --Adrenal glands: Unremarkable. --Right kidney/ureter: No hydronephrosis or radiopaque kidney stones. --Left kidney/ureter: No hydronephrosis or radiopaque kidney stones. --Urinary bladder: Unremarkable. Stomach/Bowel: --Stomach/Duodenum: There is a small to moderate-sized hiatal hernia. Again noted are thickened rugal folds similar to prior study. --Small bowel: There are dilated loops of small bowel involving the distal and terminal ileum. These loops of small bowel demonstrate fecalization consistent with slow transit. --Colon: . There is significant distention of the colon which has progressed since the prior study. There is a large amount of stool in the colon. There is an apparent transition point in the sigmoid colon (axial series 2, image 73). At this level, there is wall thickening of the sigmoid colon. There are surrounding enlarged regional lymph nodes. There is an additional focal area of narrowing involving the ascending colon (axial series 2, image 45). There are adjacent mildly enlarged regional lymph nodes. --Appendix: Not visualized. No right lower quadrant inflammation or free fluid. Vascular/Lymphatic: Atherosclerotic calcification is present within the non-aneurysmal abdominal aorta, without hemodynamically significant stenosis. --No retroperitoneal lymphadenopathy. --there are enlarged regional lymph nodes about the sigmoid colon. There  are enlarged lymph nodes along the course of the IMV (axial series 2, image 58). These lymph nodes measure up to approximately 0.9 cm. --No pelvic or inguinal lymphadenopathy. Reproductive: Unremarkable Other: No ascites or free air. The abdominal wall is normal. Musculoskeletal. There is degenerative disc disease throughout the lumbar spine. There is a posterior disc osteophyte complex at the L4-L5 level resulting in spinal canal stenosis. There is no acute displaced fracture. IMPRESSION: 1. Large amount of stool in the colon with fecalization of the distal and terminal ileum. 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 There are mildly enlarged regional and IMV lymph nodes concerning for nodal metastatic disease. Surgical consultation is recommended. 2. Additional focal area of narrowing involving the ascending colon with adjacent mildly enlarged lymph nodes as detailed above. While this may be secondary to underdistention or peristalsis, an additional mass is not excluded. 3. No free air free fluid. Additional chronic findings  as detailed above Aortic Atherosclerosis (ICD10-I70.0). These results were called by telephone at the time of interpretation on 04/16/2020 at 2:22 am to provider [facility] , who verbally acknowledged these results. Electronically Signed   By: Constance Holster M.D.   On: 04/16/2020 02:24      Assessment/Plan HTN T2DM Hypothyroidism  LBO secondary to possible sigmoid mass Abnormality of ascending colon on CT - CEA mildly elevated at 14 - patient is having some bowel function - recommend NGT for nausea and vomiting - GI following as well and considering colonoscopy vs flex-sig if patient able to be prepped - no indication for emergent surgical intervention but we will continue to follow   FEN: NPO, IVF VTE: SCDs, lovenox ID: [email or web address] x1   Norm Parcel, [facility] Surgery 04/17/2020, 10:15 AM Please see Amion for pager number during day hours 7:00am-4:30pm

## 2020-04-17 NOTE — Progress Notes (Signed)
Pt having refractory emesis even after zofran administration, MD notified, one time IV compazine dose ordered. Pt reports decrease in nausea after administration and is resting calmly. Will continue to monitor.

## 2020-04-17 NOTE — Consult Note (Addendum)
[facility] Consult: 9:00 AM 04/17/2020  LOS: 1 day    Referring Provider: Dr Sherral Hammers  Primary Care Physician:  [name], MD at [facility] Primary Gastroenterologist:  unassigned   Reason for Consultation:  Sigmoid obstruction   HPI: Bailey Walker is a 74 y.o. female.  PMH NIIDM.  Hypothyroidism.  Osteoporosis.  Spinal DJD.  SVT.  Iron deficiency and B12 deficiency.  Hgb 7.1 on 01/26/20, has never had transfusion.  Feraheme on 5/21, 5/28, 6/21. IM B12 starting 5/6.  Thrombocytosis.  Hematologic issues followed by Dr. Alvy Bimler. Previous appendectomy No previous colonoscopy, per Dr. Alvy Bimler recent notes pt refused colonoscopy suggestions in the past and reports intermittent hemorrhoidal bleeding, especially when constipated..  Visits to ED 7/4 and 7/26, c/o abd pain, distention, and n/v.  7/4 CTAP W contrast: Fluid-filled small bowel but no frank distention or obstruction.  Fluid-filled, mildly edematous: Possibly reflecting enterocolitis.  Small volume anterior mesenteric fluid, likely reactive.  Stable moderate hiatal hernia.  Thickened rugal folds, correlate for gastritis.  Retroflexed, fibroid uterus.  Aortic atherosclerosis. LFTs, lipase, chemistries on that earlier visit unremarkable other than glucose 190, calcium 8.5. Hgb 13.7.    When she returned to the ED yesterday she had not been able to keep anything down and the vomiting had continued since early in the month.  Pain now 10/10 without aggravating or relieving factor.  Emesis is nonbloody.  7 to 10 pound weight loss.  Has had little to no BM's for a few weeks but some stool overnight.  No enemas, no laxatives.  Emesis is clear or bilious.    04/16/2020 CTAP w contrast: Large volume stool in colon with equalization into the distal/terminal ileum.   Transition point at sigmoid suspicious for carcinoma.  Mildly enlarged regional and IMV lymph nodes.  Focal narrowing of the ascending colon with adjacent mild Lee enlarged lymph nodes, possibly due to under distention or peristalsis but additional mass not excluded. Current chemistries again with normal LFTs with exception of albumin now 3.3..  Potassium 3.4.  BUN and creatinine normal.  Calcium now 7. WBCs normal.  Hgb 11.6 after IVF.    Fm Hx Neg for colorectal cancer.  Her sister recently passed with cancer: lungs, liver, bone  Social Hx Former smoker.  No ETOH.  Immigrated from [place] in [place].  She is a Engineer, manufacturing, originally from [place].  Does not speak english.  Lives w her Niece [name], whom the pt raised.       Past Medical History:  Diagnosis Date  . Diabetes mellitus without complication ([place])   . Hypertension   . SVT (supraventricular tachycardia) ([place])   . Thyroid disease     Past Surgical History:  Procedure Laterality Date  . APPENDECTOMY    . BREAST SURGERY     Fatty tissue on biopsy  . EYE SURGERY Bilateral    april and march 2019 for cataracts.   . fatty gland     left wrist , right breast  . KNEE SURGERY     left knee  Prior to Admission medications   Medication Sig Start Date End Date Taking? Authorizing Provider  alendronate (FOSAMAX) 70 MG tablet Take 70 mg by mouth every Sunday.  03/01/20  Yes [provider]  aspirin EC 81 MG tablet Take 81 mg by mouth daily. Swallow whole.   Yes [provider]  denosumab (PROLIA) 60 MG/ML SOSY injection Inject 60 mg into the skin every 6 (six) months.   Yes [provider]  DEXILANT 30 MG capsule TAKE 1 CAPSULE BY MOUTH EVERY DAY Patient taking differently: Take 30 mg by mouth daily.  10/20/19  Yes Tresa Garter, MD  Dextromethorphan-guaiFENesin 10-100 MG/5ML liquid Take 5 mLs by mouth every 12 (twelve) hours as needed (cough).    Yes [provider]  glipiZIDE  (GLUCOTROL) 5 MG tablet TAKE 1 TABLET BY MOUTH TWICE A DAY BEFORE MEALS Patient taking differently: Take 5 mg by mouth See admin instructions. Take one tablet (5 mg) by mouth twice daily - after lunch and at midnight 11/04/19  Yes Dorena Dew, FNP  levothyroxine (SYNTHROID) 75 MCG tablet TAKE 1 TABLET BY MOUTH EVERY DAY Patient taking differently: Take 75 mcg by mouth daily before breakfast.  01/02/20  Yes Dorena Dew, FNP  metFORMIN (GLUCOPHAGE) 500 MG tablet Take 0.5 tablets (250 mg total) by mouth 2 (two) times daily with a meal. Patient taking differently: Take 500 mg by mouth See admin instructions. Take one tablet (500 mg) by mouth twice daily - after lunch and at midnight 05/16/19  Yes [name], FNP  metoprolol tartrate (LOPRESSOR) 25 MG tablet Take 1 tablet (25 mg total) by mouth 2 (two) times daily. 08/02/19  Yes Tresa Garter, MD  nitroGLYCERIN (NITROSTAT) 0.4 MG SL tablet Place 1 tablet (0.4 mg total) under the tongue every 5 (five) minutes as needed for chest pain. 05/16/19 04/16/20 Yes [name], FNP  ondansetron (ZOFRAN) 4 MG tablet Take 1 tablet (4 mg total) by mouth every 8 (eight) hours as needed for nausea or vomiting. 03/25/20  Yes Hayden Rasmussen, MD  prednisoLONE acetate (PRED FORTE) 1 % ophthalmic suspension Place 1 drop into both eyes See admin instructions. Instill one drop into both eyes on Monday, Wednesday, Friday nights   Yes [provider]  promethazine (PHENERGAN) 12.5 MG suppository Place 2 suppositories (25 mg total) rectally every 6 (six) hours as needed for nausea or vomiting. Patient not taking: Reported on 04/16/2020 03/25/20   Hayden Rasmussen, MD    Scheduled Meds: . aspirin EC  81 mg Oral Daily  . enoxaparin (LOVENOX) injection  40 mg Subcutaneous Q24H  . insulin aspart  0-15 Units Subcutaneous TID WC  . insulin aspart  0-5 Units Subcutaneous QHS  . levothyroxine  75 mcg Oral QAC breakfast  . pantoprazole  40 mg Oral Daily    . sodium chloride flush  3 mL Intravenous Once   Infusions: . sodium chloride 75 mL/hr at 04/16/20 1642  . dextrose 5 % and 0.45% NaCl 75 mL/hr at 04/16/20 1258   PRN Meds: acetaminophen **OR** acetaminophen, hydrALAZINE, HYDROmorphone (DILAUDID) injection, ondansetron **OR** ondansetron (ZOFRAN) IV, oxyCODONE   Allergies as of 04/15/2020 - Review Complete 04/15/2020  Allergen Reaction Noted  . Nitroglycerin Nausea And Vomiting 01/31/2020  . Ampicillin Other (See Comments) 12/29/2016    Family History  Problem Relation Age of Onset  . Breast cancer Cousin        mat and pat sides  . Cancer Sister  cancer everywhere  . Colon cancer Neg Hx   . Esophageal cancer Neg Hx   . Rectal cancer Neg Hx     Social History   Socioeconomic History  . Marital status: Single    Spouse name: Not on file  . Number of children: 0  . Years of education: Not on file  . Highest education level: Not on file  Occupational History  . Occupation: retired  Tobacco Use  . Smoking status: Former Smoker    Quit date: 12/29/2013    Years since quitting: 6.3  . Smokeless tobacco: Never Used  Vaping Use  . Vaping Use: Never used  Substance and Sexual Activity  . Alcohol use: No  . Drug use: No  . Sexual activity: Not on file  Other Topics Concern  . Not on file  Social History Narrative  . Not on file   Social Determinants of Health   Financial Resource Strain:   . Difficulty of Paying Living Expenses:   Food Insecurity:   . Worried About Charity fundraiser in the Last Year:   . Arboriculturist in the Last Year:   Transportation Needs:   . Film/video editor (Medical):   Marland Kitchen Lack of Transportation (Non-Medical):   Physical Activity:   . Days of Exercise per Week:   . Minutes of Exercise per Session:   Stress:   . Feeling of Stress :   Social Connections:   . Frequency of Communication with Friends and Family:   . Frequency of Social Gatherings with Friends and Family:    . Attends Religious Services:   . Active Member of Clubs or Organizations:   . Attends Archivist Meetings:   Marland Kitchen Marital Status:   Intimate Partner Violence:   . Fear of Current or Ex-Partner:   . Emotionally Abused:   Marland Kitchen Physically Abused:   . Sexually Abused:     REVIEW OF SYSTEMS: Constitutional:  + weakness, + dizziness.  No syncope ENT:  No nose bleeds Pulm:  No SOB or cough CV:  No palpitations, no LE edema.  occ non-exertional CP GU:  No hematuria, no frequency GI:  See HPI Heme:  No unusual bleeding or bruising   Transfusions:  none Neuro:  No headaches, no peripheral tingling or numbness Derm:  No itching, no rash or sores.  Endocrine:  No sweats or chills.  No polyuria or dysuria Immunization:  No Covid 19 vax.    PHYSICAL EXAM: Vital signs in last 24 hours: Vitals:   04/17/20 0103 04/17/20 0326  BP: (!) 132/66 (!) 156/75  Pulse: (!) 107 104  Resp: 18 18  Temp: 98.3 F (36.8 C) 98.2 F (36.8 C)  SpO2: 97% 98%   Wt Readings from Last 3 Encounters:  04/16/20 72.6 kg  03/25/20 75.1 kg  03/12/20 74.9 kg    General: looks moderately ill.  Sleepy but arousable.   Head: No facial asymmetry or swelling.  No signs of head trauma. Eyes: No scleral icterus.  No conjunctival pallor. Ears: Not hard of hearing Nose: No congestion or discharge Mouth: Edentulous.  Mucosa moist, pink, clear.  Tongue midline. Neck: No JVD, no masses, no thyromegaly Lungs: Clear bilaterally.  No labored breathing or cough. Heart: Slightly tacky regular rhythm.  S1, S2 present.  No MRG. Abdomen: Distended, tense, tender in the mid abdomen without guarding or rebound.  Bowel sounds absent.  No high-pitched or tinkling/tympanic sounds.  No masses, organomegaly, bruits, hernias appreciated.  Rectal: Deferred Musc/Skeltl: No joint redness, swelling or gross deformities. Extremities: No CCE. Neurologic: Alert.  No involuntary movements.  Moves all 4 limbs, strength not tested.   Not disoriented. Skin: No rash, no sores, no suspicious lesions. Nodes: No cervical adenopathy Psych: Calm, flat/depressed affect in this sleepy pt  Intake/Output from previous day: 07/26 0701 - 07/27 0700 In: 0  Out: 100 [Urine:100] Intake/Output this shift: No intake/output data recorded.  LAB RESULTS: Recent Labs    04/16/20 0042 04/16/20 1559 04/17/20 0209  WBC 8.3 5.9 5.6  HGB 13.0 12.2 11.6*  HCT 40.7 38.3 37.8  PLT 316 310 276   BMET Lab Results  Component Value Date   NA 137 04/17/2020   NA 137 04/16/2020   NA 135 03/25/2020   K 3.4 (L) 04/17/2020   K 3.6 04/16/2020   K 4.6 03/25/2020   CL 106 04/17/2020   CL 102 04/16/2020   CL 102 03/25/2020   CO2 21 (L) 04/17/2020   CO2 21 (L) 04/16/2020   CO2 17 (L) 03/25/2020   GLUCOSE 122 (H) 04/17/2020   GLUCOSE 142 (H) 04/16/2020   GLUCOSE 190 (H) 03/25/2020   BUN 8 04/17/2020   BUN 10 04/16/2020   BUN 12 03/25/2020   CREATININE 0.59 04/17/2020   CREATININE 0.58 04/16/2020   CREATININE 0.48 04/16/2020   CALCIUM 7.0 (L) 04/17/2020   CALCIUM 7.7 (L) 04/16/2020   CALCIUM 8.5 (L) 03/25/2020   LFT Recent Labs    04/16/20 0042 04/17/20 0209  PROT 7.9 6.3*  ALBUMIN 4.3 3.3*  AST 16 10*  ALT 11 9  ALKPHOS 58 47  BILITOT 0.5 0.7   PT/INR No results found for: INR, PROTIME Hepatitis Panel No results for input(s): HEPBSAG, HCVAB, HEPAIGM, HEPBIGM in the last 72 hours. C-Diff No components found for: CDIFF Lipase     Component Value Date/Time   LIPASE 19 04/16/2020 0042    Drugs of Abuse  No results found for: LABOPIA, COCAINSCRNUR, LABBENZ, AMPHETMU, THCU, LABBARB   RADIOLOGY STUDIES: CT Abdomen Pelvis W Contrast  Result Date: 04/16/2020 CLINICAL DATA:  Abdominal pain.  Emesis. EXAM: CT ABDOMEN AND PELVIS WITH CONTRAST TECHNIQUE: Multidetector CT imaging of the abdomen and pelvis was performed using the standard protocol following bolus administration of intravenous contrast. CONTRAST:  11mL  OMNIPAQUE IOHEXOL 300 MG/ML  SOLN COMPARISON:  March 25, 2020 FINDINGS: Lower chest: The lung bases are clear. The heart is enlarged. Hepatobiliary: The liver is normal. Normal gallbladder.There is no biliary ductal dilation. Pancreas: Normal contours without ductal dilatation. No peripancreatic fluid collection. Spleen: Unremarkable. Adrenals/Urinary Tract: --Adrenal glands: Unremarkable. --Right kidney/ureter: No hydronephrosis or radiopaque kidney stones. --Left kidney/ureter: No hydronephrosis or radiopaque kidney stones. --Urinary bladder: Unremarkable. Stomach/Bowel: --Stomach/Duodenum: There is a small to moderate-sized hiatal hernia. Again noted are thickened rugal folds similar to prior study. --Small bowel: There are dilated loops of small bowel involving the distal and terminal ileum. These loops of small bowel demonstrate fecalization consistent with slow transit. --Colon: . There is significant distention of the colon which has progressed since the prior study. There is a large amount of stool in the colon. There is an apparent transition point in the sigmoid colon (axial series 2, image 73). At this level, there is wall thickening of the sigmoid colon. There are surrounding enlarged regional lymph nodes. There is an additional focal area of narrowing involving the ascending colon (axial series 2, image 45). There are adjacent mildly enlarged regional lymph nodes. --Appendix: Not visualized.  No right lower quadrant inflammation or free fluid. Vascular/Lymphatic: Atherosclerotic calcification is present within the non-aneurysmal abdominal aorta, without hemodynamically significant stenosis. --No retroperitoneal lymphadenopathy. --there are enlarged regional lymph nodes about the sigmoid colon. There are enlarged lymph nodes along the course of the IMV (axial series 2, image 58). These lymph nodes measure up to approximately 0.9 cm. --No pelvic or inguinal lymphadenopathy. Reproductive: Unremarkable Other:  No ascites or free air. The abdominal wall is normal. Musculoskeletal. There is degenerative disc disease throughout the lumbar spine. There is a posterior disc osteophyte complex at the L4-L5 level resulting in spinal canal stenosis. There is no acute displaced fracture. IMPRESSION: 1. Large amount of stool in the colon with fecalization of the distal and terminal ileum. 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 There are mildly enlarged regional and IMV lymph nodes concerning for nodal metastatic disease. Surgical consultation is recommended. 2. Additional focal area of narrowing involving the ascending colon with adjacent mildly enlarged lymph nodes as detailed above. While this may be secondary to underdistention or peristalsis, an additional mass is not excluded. 3. No free air free fluid. Additional chronic findings as detailed above Aortic Atherosclerosis (ICD10-I70.0). These results were called by telephone at the time of interpretation on 04/16/2020 at 2:22 am to provider [facility] , who verbally acknowledged these results. Electronically Signed   By: Constance Holster M.D.   On: 04/16/2020 02:24      IMPRESSION:   *   Sigmoid obstruction.  R/o neoplasia.  R/o IBD w stricture.    *   B12 and IDA.  Dr Alvy Bimler managing and treating w Feraheme, IM B12.  No longer anemic.    *   Hypocalcemia.  Level corrected for albumin is 7.4.    *   DM2.  On oral agents.    PLAN:     *   Colonoscopy if able to prep pt, O/w Flex Sig.    *   NGT to LIS??   *   Fleets enemas, ordered   Azucena Freed  04/17/2020, 9:00 AM Phone [phone]

## 2020-04-18 ENCOUNTER — Other Ambulatory Visit: Payer: Self-pay

## 2020-04-18 ENCOUNTER — Encounter (HOSPITAL_COMMUNITY)
Admission: EM | Disposition: A | Discharge status: Home Health | Payer: Self-pay | Source: Home / Self Care | Attending: Internal Medicine

## 2020-04-18 ENCOUNTER — Inpatient Hospital Stay (HOSPITAL_COMMUNITY): Payer: Medicaid Other | Admitting: Anesthesiology

## 2020-04-18 ENCOUNTER — Encounter (HOSPITAL_COMMUNITY): Payer: Self-pay | Admitting: Family Medicine

## 2020-04-18 DIAGNOSIS — K641 Second degree hemorrhoids: Secondary | ICD-10-CM

## 2020-04-18 DIAGNOSIS — K2990 Gastroduodenitis, unspecified, without bleeding: Secondary | ICD-10-CM

## 2020-04-18 DIAGNOSIS — K317 Polyp of stomach and duodenum: Secondary | ICD-10-CM

## 2020-04-18 DIAGNOSIS — K2970 Gastritis, unspecified, without bleeding: Secondary | ICD-10-CM

## 2020-04-18 HISTORY — PX: FLEXIBLE SIGMOIDOSCOPY: SHX5431

## 2020-04-18 HISTORY — PX: BIOPSY: SHX5522

## 2020-04-18 HISTORY — PX: ESOPHAGOGASTRODUODENOSCOPY (EGD) WITH PROPOFOL: SHX5813

## 2020-04-18 LAB — CBC WITH DIFFERENTIAL/PLATELET
Abs Immature Granulocytes: 0.01 10*3/uL (ref 0.00–0.07)
Basophils Absolute: 0 10*3/uL (ref 0.0–0.1)
Basophils Relative: 1 %
Eosinophils Absolute: 0 10*3/uL (ref 0.0–0.5)
Eosinophils Relative: 1 %
HCT: 38.9 % (ref 36.0–46.0)
Hemoglobin: 12.2 g/dL (ref 12.0–15.0)
Immature Granulocytes: 0 %
Lymphocytes Relative: 35 %
Lymphs Abs: 1.4 10*3/uL (ref 0.7–4.0)
MCH: 28.3 pg (ref 26.0–34.0)
MCHC: 31.4 g/dL (ref 30.0–36.0)
MCV: 90.3 fL (ref 80.0–100.0)
Monocytes Absolute: 1 10*3/uL (ref 0.1–1.0)
Monocytes Relative: 26 %
Neutro Abs: 1.5 10*3/uL — ABNORMAL LOW (ref 1.7–7.7)
Neutrophils Relative %: 37 %
Platelets: 320 10*3/uL (ref 150–400)
RBC: 4.31 MIL/uL (ref 3.87–5.11)
RDW: 16.9 % — ABNORMAL HIGH (ref 11.5–15.5)
WBC: 4 10*3/uL (ref 4.0–10.5)
nRBC: 0 % (ref 0.0–0.2)

## 2020-04-18 LAB — COMPREHENSIVE METABOLIC PANEL
ALT: 8 U/L (ref 0–44)
AST: 11 U/L — ABNORMAL LOW (ref 15–41)
Albumin: 3.2 g/dL — ABNORMAL LOW (ref 3.5–5.0)
Alkaline Phosphatase: 50 U/L (ref 38–126)
Anion gap: 10 (ref 5–15)
BUN: 15 mg/dL (ref 8–23)
CO2: 21 mmol/L — ABNORMAL LOW (ref 22–32)
Calcium: 7.5 mg/dL — ABNORMAL LOW (ref 8.9–10.3)
Chloride: 107 mmol/L (ref 98–111)
Creatinine, Ser: 0.62 mg/dL (ref 0.44–1.00)
GFR calc Af Amer: >60 mL/min (ref >60)
GFR calc non Af Amer: >60 mL/min (ref >60)
Glucose, Bld: 111 mg/dL — ABNORMAL HIGH (ref 70–99)
Potassium: 3.7 mmol/L (ref 3.5–5.1)
Sodium: 138 mmol/L (ref 135–145)
Total Bilirubin: 0.7 mg/dL (ref 0.3–1.2)
Total Protein: 6.2 g/dL — ABNORMAL LOW (ref 6.5–8.1)

## 2020-04-18 LAB — GLUCOSE, CAPILLARY
Glucose-Capillary: 100 mg/dL — ABNORMAL HIGH (ref 70–99)
Glucose-Capillary: 102 mg/dL — ABNORMAL HIGH (ref 70–99)
Glucose-Capillary: 115 mg/dL — ABNORMAL HIGH (ref 70–99)
Glucose-Capillary: 120 mg/dL — ABNORMAL HIGH (ref 70–99)
Glucose-Capillary: 79 mg/dL (ref 70–99)
Glucose-Capillary: 98 mg/dL (ref 70–99)

## 2020-04-18 LAB — PHOSPHORUS: Phosphorus: 2.5 mg/dL (ref 2.5–4.6)

## 2020-04-18 LAB — MAGNESIUM: Magnesium: 2.2 mg/dL (ref 1.7–2.4)

## 2020-04-18 SURGERY — ESOPHAGOGASTRODUODENOSCOPY (EGD) WITH PROPOFOL
Anesthesia: Monitor Anesthesia Care

## 2020-04-18 MED ORDER — BISACODYL 5 MG PO TBEC
10.0000 mg | DELAYED_RELEASE_TABLET | Freq: Two times a day (BID) | ORAL | Status: DC
  Administered 2020-04-18 – 2020-04-19 (×3): 10 mg via ORAL
  Filled 2020-04-18 (×3): qty 2

## 2020-04-18 MED ORDER — METOPROLOL TARTRATE 25 MG PO TABS
25.0000 mg | ORAL_TABLET | Freq: Two times a day (BID) | ORAL | Status: DC
  Administered 2020-04-18 – 2020-04-19 (×3): 25 mg via ORAL
  Filled 2020-04-18 (×3): qty 1

## 2020-04-18 MED ORDER — LIDOCAINE 2% (20 MG/ML) 5 ML SYRINGE
INTRAMUSCULAR | Status: DC | PRN
  Administered 2020-04-18: 40 mg via INTRAVENOUS

## 2020-04-18 MED ORDER — SPOT INK MARKER SYRINGE KIT
PACK | SUBMUCOSAL | Status: AC
  Filled 2020-04-18: qty 10

## 2020-04-18 MED ORDER — FLEET ENEMA 7-19 GM/118ML RE ENEM
1.0000 | ENEMA | Freq: Once | RECTAL | Status: AC
  Administered 2020-04-18: 1 via RECTAL
  Filled 2020-04-18: qty 1

## 2020-04-18 MED ORDER — PROPOFOL 500 MG/50ML IV EMUL
INTRAVENOUS | Status: DC | PRN
  Administered 2020-04-18: 100 ug/kg/min via INTRAVENOUS

## 2020-04-18 MED ORDER — LACTATED RINGERS IV SOLN: INTRAVENOUS | Status: DC

## 2020-04-18 MED ORDER — PROPOFOL 10 MG/ML IV BOLUS
INTRAVENOUS | Status: DC | PRN
  Administered 2020-04-18: 20 mg via INTRAVENOUS

## 2020-04-18 MED ORDER — PHENYLEPHRINE HCL-NACL 10-0.9 MG/250ML-% IV SOLN
INTRAVENOUS | Status: DC | PRN
Start: 2020-04-18 — End: 2020-04-18
  Administered 2020-04-18: 20 ug/min via INTRAVENOUS

## 2020-04-18 MED ORDER — ALBUMIN HUMAN 5 % IV SOLN
INTRAVENOUS | Status: DC | PRN
Start: 2020-04-18 — End: 2020-04-18

## 2020-04-18 SURGICAL SUPPLY — 15 items

## 2020-04-18 NOTE — Op Note (Signed)
[facility] Patient Name: Bailey Walker Procedure Date : 04/18/2020 MRN: [id number] Attending MD: Gerrit Heck , MD Date of Birth: 08-Jan-1946 CSN: [id number] Age: 74 Admit Type: Inpatient Procedure:                Flexible Sigmoidoscopy Indications:              Generalized abdominal pain, Abnormal CT of the GI                            tract, Change in bowel habits Providers:                Gerrit Heck, MD, Glori Bickers, RN, Laverda Sorenson, Technician Referring MD:              Medicines:                Monitored Anesthesia Care Complications:            No immediate complications. Estimated Blood Loss:     Estimated blood loss: none. Procedure:                Pre-Anesthesia Assessment:                           - Prior to the procedure, a History and Physical                            was performed, and patient medications and                            allergies were reviewed. The patient's tolerance of                            previous anesthesia was also reviewed. The risks                            and benefits of the procedure and the sedation                            options and risks were discussed with the patient.                            All questions were answered, and informed consent                            was obtained. Prior Anticoagulants: The patient has                            taken no previous anticoagulant or antiplatelet                            agents. ASA Grade Assessment: II - A patient with  mild systemic disease. After reviewing the risks                            and benefits, the patient was deemed in                            satisfactory condition to undergo the procedure.                           After obtaining informed consent, the scope was                            passed under direct vision. The GIF-[id number] ([id number])                            Olympus  gastroscope was introduced through the anus                            and advanced to the the rectosigmoid junction. The                            flexible sigmoidoscopy was technically difficult                            and complex due to poor bowel prep. Successful                            completion of the procedure was aided by lavage.                            The quality of the bowel preparation was poor. Scope In: 9:55:34 AM Scope Out: 10:03:55 AM Total Procedure Duration: 0 hours 8 minutes 21 seconds  Findings:      Hemorrhoids were found on perianal exam.      A large amount of solid stool was found in the rectum and in the       recto-sigmoid colon, precluding visualization. Lavage of the area was       performed using copious amounts of tap water, resulting in incomplete       clearance with continued poor visualization.      Non-bleeding internal hemorrhoids were found during retroflexion. The       hemorrhoids were medium-sized and Grade II (internal hemorrhoids that       prolapse but reduce spontaneously). Impression:               - Preparation of the colon was poor. There was                            solid stool in the rectum and in the recto-sigmoid                            colon which precluded visualization and passage of                            the endoscope.  Despite irrigation with copious                            amounts of fluids, could not improve visualization,                            and the procedure was then aborted.                           - Hemorrhoids found on perianal exam.                           - Non-bleeding internal hemorrhoids.                           - No specimens collected. Recommendation:           - Return patient to hospital ward for ongoing care.                           - Continue present medications.                           - Start Dulcolax.                           - Start Fleets enemas today along with repeat  prior                            to repeat procedure.                           - Repeat flexible sigmoidoscopy at appointment to                            be scheduled, either tomorrow or the following day.                           - Unable to do bowel preparation due to obstructive                            symptoms. Procedure Code(s):        --- Professional ---                           [phone], 52, Sigmoidoscopy, flexible; diagnostic,                            including collection of specimen(s) by brushing or                            washing, when performed (separate procedure) Diagnosis Code(s):        --- Professional ---                           K64.1, Second degree hemorrhoids  R10.84, Generalized abdominal pain                           R19.4, Change in bowel habit                           R93.3, Abnormal findings on diagnostic imaging of                            other parts of digestive tract CPT copyright 2019 [employer]. All rights reserved. The codes documented in this report are preliminary and upon coder review may  be revised to meet current compliance requirements. Gerrit Heck, MD 04/18/2020 10:29:59 AM Number of Addenda: 0

## 2020-04-18 NOTE — Progress Notes (Signed)
PROGRESS NOTE    Bailey Walker  JQB:[id number] DOB: 02-09-1946 DOA: 04/15/2020 PCP: [name], MD    Brief Narrative:  74 y.o. female with medical history significant of hypertension, type 2 diabetes mellitus, hypothyroidism, obesity presents to emergency department with abdominal pain and vomiting since 2 days.  Patient speaks Arabic.  History gathered from patient's daughter at the bedside.  She mentioned that patient has severe generalized abdominal pain and vomiting since July 4 however it got worse since 2 days.  She could not keep anything down, she has 10 out of 10, nonradiating, sharp pain, no aggravating or relieving factors, associated with vomiting 4 times since yesterday.  Vomitus is nonbloody.  She has lost about 7 to 10 pounds since July for the due to abdominal pain and vomiting.  No history of colon cancer in the family.  No night sweats, melena, fever, chills, headache, blurry vision, chest pain, shortness of breath, palpitation, leg swelling.  Reports dysuria since few days.  No foul-smelling or change in urinary frequency, lower back pain.  She had regular bowel movement this morning.  She is a former smoker.  No history of alcohol, illicit drug use.  ED Course: Upon arrival to ED: Patient's vital signs stable.  She received IV fluid bolus, Protonix, Zofran, Dilaudid as needed for pain control and Rocephin for possible UTI in ED.  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  Patient transferred to [facility] for further evaluation and management.  Assessment & Plan:   Principal Problem:   Colon obstruction ([place]) Active Problems:   Hypothyroidism   Essential hypertension   Diabetes mellitus due to underlying condition with unspecified complications ([place])   UTI (urinary tract infection)   Abdominal distension   Colonic mass   Nausea and vomiting in adult   Abnormal CT scan, gastrointestinal tract    Gastritis and gastroduodenitis   Grade II internal hemorrhoids  Abdominal pain with vomiting: -Concern for possible underlying obstructive colorectal carcinoma. -Patient's vital signs remain stable.  She is afebrile with no leukocytosis.  COVID-19 negative.  Lipase: WNL. -continue IVF hydration for now -GI following. Discussed with Dr. Bryan Lemma. Gastritis noted on EGD. Unable to visualize on flex sig given stool present -Per GI, plan for bowel prep today, re-attempt flex sig tomorrow -Of note, CEA is elevated at 14.1  UTI: Patient reports dysuria.  UA is positive for leukocyte/bacteria/WBC -Urine culture is pending.  She received Rocephin in ED.  Continued on rocephin.  Follow urine culture.  Hypertension:  -metoprolol initially held given soft BP at time of admit -SBP now in the 150's. Will resume home med  Hypothyroidism: Check TSH in AM -Continue levothyroxine  Type 2 diabetes mellitus: Hold glipizide and Metformin.  Started patient on sliding scale insulin and monitor blood sugar closely  Iron deficiency anemia: H&H is stable at this time -Continue ferrous sulfate -Followed by hematology as outpatient Dr. Alvy Bimler  DVT prophylaxis: Lovenox subq Code Status: Full Family Communication: Pt in room, family at bedside  Status is: Inpatient  Remains inpatient appropriate because:Ongoing diagnostic testing needed not appropriate for outpatient work up   Dispo: The patient is from: Home              Anticipated d/c is to: Home              Anticipated d/c date is: 2 days              Patient currently is not  medically stable to d/c.       Consultants:   GI  Procedures:   EGD, flex sig 7/28  Antimicrobials: Anti-infectives (From admission, onward)   Start     Dose/Rate Route Frequency Ordered Stop   04/17/20 0000  cefTRIAXone (ROCEPHIN) 1 g in sodium chloride 0.9 % 100 mL IVPB        1 g 200 mL/hr over 30 Minutes Intravenous  Once 04/16/20 1604 04/16/20  2347   04/16/20 0115  cefTRIAXone (ROCEPHIN) 1 g in sodium chloride 0.9 % 100 mL IVPB        1 g 200 mL/hr over 30 Minutes Intravenous  Once 04/16/20 0113 04/16/20 0435       Subjective: Through translator, without complaints.   Objective: Vitals:   04/18/20 1032 04/18/20 1040 04/18/20 1127 04/18/20 1306  BP: (!) 132/106 (!) 159/48 (!) 153/75 (!) 157/77  Pulse: 95 96 90 96  Resp: (!) 11 12 16 16   Temp:   98.1 F (36.7 C) (!) 97.5 F (36.4 C)  TempSrc:   Oral Oral  SpO2: 97% 97% 94% 98%  Weight:      Height:        Intake/Output Summary (Last 24 hours) at 04/18/2020 1439 Last data filed at 04/18/2020 1316 Gross per 24 hour  Intake 3055.43 ml  Output 800 ml  Net 2255.43 ml   Filed Weights   04/15/20 1956 04/16/20 1805  Weight: 72.9 kg 72.6 kg    Examination:  General exam: Appears calm and comfortable  Respiratory system: Clear to auscultation. Respiratory effort normal. Cardiovascular system: S1 & S2 heard, Regular Gastrointestinal system: Abdomen is distended, trace bowel sounds, nontender Central nervous system: Alert and oriented. No focal neurological deficits. Extremities: Symmetric 5 x 5 power. Skin: No rashes, lesions Psychiatry: Judgement and insight appear normal. Mood & affect appropriate.   Data Reviewed: I have personally reviewed following labs and imaging studies  CBC: Recent Labs  Lab 04/16/20 0042 04/16/20 1559 04/17/20 0209 04/18/20 0447  WBC 8.3 5.9 5.6 4.0  NEUTROABS  --   --   --  1.5*  HGB 13.0 12.2 11.6* 12.2  HCT 40.7 38.3 37.8 38.9  MCV 89.1 89.3 90.6 90.3  PLT 316 310 276 354   Basic Metabolic Panel: Recent Labs  Lab 04/16/20 0042 04/16/20 1559 04/17/20 0209 04/18/20 0447  NA 137  --  137 138  K 3.6  --  3.4* 3.7  CL 102  --  106 107  CO2 21*  --  21* 21*  GLUCOSE 142*  --  122* 111*  BUN 10  --  8 15  CREATININE 0.48 0.58 0.59 0.62  CALCIUM 7.7*  --  7.0* 7.5*  MG  --  1.9  --  2.2  PHOS  --   --   --  2.5    GFR: Estimated Creatinine Clearance: 54.5 mL/min (by C-G formula based on SCr of 0.62 mg/dL). Liver Function Tests: Recent Labs  Lab 04/16/20 0042 04/17/20 0209 04/18/20 0447  AST 16 10* 11*  ALT 11 9 8   ALKPHOS 58 47 50  BILITOT 0.5 0.7 0.7  PROT 7.9 6.3* 6.2*  ALBUMIN 4.3 3.3* 3.2*   Recent Labs  Lab 04/16/20 0042  LIPASE 19   No results for input(s): AMMONIA in the last 168 hours. Coagulation Profile: No results for input(s): INR, PROTIME in the last 168 hours. Cardiac Enzymes: No results for input(s): CKTOTAL, CKMB, CKMBINDEX, TROPONINI in the last 168 hours. BNP (  last 3 results) No results for input(s): PROBNP in the last 8760 hours. HbA1C: Recent Labs    04/16/20 1605  HGBA1C 5.3   CBG: Recent Labs  Lab 04/17/20 2025 04/18/20 0038 04/18/20 0441 04/18/20 0742 04/18/20 1149  GLUCAP 85 102* 100* 115* 120*   Lipid Profile: No results for input(s): CHOL, HDL, LDLCALC, TRIG, CHOLHDL, LDLDIRECT in the last 72 hours. Thyroid Function Tests: No results for input(s): TSH, T4TOTAL, FREET4, T3FREE, THYROIDAB in the last 72 hours. Anemia Panel: No results for input(s): VITAMINB12, FOLATE, FERRITIN, TIBC, IRON, RETICCTPCT in the last 72 hours. Sepsis Labs: No results for input(s): PROCALCITON, LATICACIDVEN in the last 168 hours.  Recent Results (from the past 240 hour(s))  Urine Culture     Status: Abnormal   Collection Time: 04/16/20  1:13 AM   Specimen: Urine, Random  Result Value Ref Range Status   Specimen Description   Final    URINE, RANDOM Performed at [facility], [address] [place], [place] [address]    Special Requests   Final    NONE Performed at [facility], [address] [place], [place] [address]    Culture MULTIPLE SPECIES PRESENT, SUGGEST RECOLLECTION (A)  Final   Report Status 04/17/2020 FINAL  Final  SARS Coronavirus 2 by RT PCR (hospital order, performed in [facility] hospital lab) Nasopharyngeal  Nasopharyngeal Swab     Status: None   Collection Time: 04/16/20  4:27 AM   Specimen: Nasopharyngeal Swab  Result Value Ref Range Status   SARS Coronavirus 2 NEGATIVE NEGATIVE Final    Comment: (NOTE) SARS-CoV-2 target nucleic acids are NOT DETECTED.  The SARS-CoV-2 RNA is generally detectable in upper and lower respiratory specimens during the acute phase of infection. The lowest concentration of SARS-CoV-2 viral copies this assay can detect is 250 copies / mL. A negative result does not preclude SARS-CoV-2 infection and should not be used as the sole basis for treatment or other patient management decisions.  A negative result may occur with improper specimen collection / handling, submission of specimen other than nasopharyngeal swab, presence of viral mutation(s) within the areas targeted by this assay, and inadequate number of viral copies (<250 copies / mL). A negative result must be combined with clinical observations, patient history, and epidemiological information.  Fact Sheet for Patients:   StrictlyIdeas.no  Fact Sheet for Healthcare Providers: BankingDealers.co.za  This test is not yet approved or  cleared by the [place] FDA and has been authorized for detection and/or diagnosis of SARS-CoV-2 by FDA under an Emergency Use Authorization (EUA).  This EUA will remain in effect (meaning this test can be used) for the duration of the COVID-19 declaration under Section 564(b)(1) of the Act, 21 U.S.C. section 360bbb-3(b)(1), unless the authorization is terminated or revoked sooner.  Performed at [facility], [address] [place], [place] [address]      Radiology Studies: DG Abd 1 View  Result Date: 04/18/2020 CLINICAL DATA:  Nasogastric tube placement EXAM: ABDOMEN - 1 VIEW COMPARISON:  None. FLUOROSCOPY TIME:  3 minutes 18 seconds; 23.70 mGy; 1 acquired image FINDINGS: Nasogastric tube tip and side  port are in the distal stomach. Relative paucity of gas seen on somewhat limited supine abdominal image. No free air appreciable. Visualized lung bases clear. IMPRESSION: Nasogastric tube tip and side port in distal stomach. Paucity of gas of uncertain etiology on limited study. No free air evident on current supine examination.  Electronically Signed   By: Lowella Grip III M.D.   On: 04/18/2020 07:54    Scheduled Meds: . aspirin EC  81 mg Oral Daily  . bisacodyl  10 mg Oral BID  . enoxaparin (LOVENOX) injection  40 mg Subcutaneous Q24H  . insulin aspart  0-20 Units Subcutaneous Q4H  . levothyroxine  75 mcg Oral QAC breakfast  . pantoprazole  40 mg Oral Daily  . sodium chloride flush  3 mL Intravenous Once   Continuous Infusions: . sodium chloride 75 mL/hr at 04/18/20 0459  . dextrose 5 % and 0.45% NaCl 75 mL/hr at 04/16/20 1258  . lactated ringers 20 mL/hr at 04/18/20 0940     LOS: 2 days   Marylu Lund, MD [facility] Pager On Amion  If 7PM-7AM, please contact night-coverage 04/18/2020, 2:39 PM

## 2020-04-18 NOTE — Transfer of Care (Signed)
Immediate Anesthesia Transfer of Care Note  Patient: Bailey Walker  Procedure(s) Performed: ESOPHAGOGASTRODUODENOSCOPY (EGD) WITH PROPOFOL (N/A ) FLEXIBLE SIGMOIDOSCOPY (N/A ) BIOPSY  Patient Location: PACU  Anesthesia Type:MAC  Level of Consciousness: awake and patient cooperative  Airway & Oxygen Therapy: Patient Spontanous Breathing and Patient connected to nasal cannula oxygen  Post-op Assessment: Report given to RN and Post -op Vital signs reviewed and stable  Post vital signs: Reviewed and stable  Last Vitals:  Vitals Value Taken Time  BP 128/54 04/18/20 1012  Temp    Pulse 91 04/18/20 1017  Resp 10 04/18/20 1017  SpO2 99 % 04/18/20 1017  Vitals shown include unvalidated device data.  Last Pain:  Vitals:   04/18/20 1012  TempSrc:   PainSc: 0-No pain         Complications: No complications documented.

## 2020-04-18 NOTE — Interval H&P Note (Signed)
History and Physical Interval Note:  04/18/2020 9:31 AM  Nyhla Gerri Walker  has presented today for surgery, with the diagnosis of bowel obstruction.  stomach thickened on imaging.  n/v/abdominal pain.  The various methods of treatment have been discussed with the patient and family. After consideration of risks, benefits and other options for treatment, the patient has consented to  Procedure(s): ESOPHAGOGASTRODUODENOSCOPY (EGD) WITH PROPOFOL (N/A) FLEXIBLE SIGMOIDOSCOPY (N/A) as a surgical intervention.  The patient's history has been reviewed, patient examined, no change in status, stable for surgery.  I have reviewed the patient's chart and labs.  Questions were answered to the patient's satisfaction.     Bailey Walker

## 2020-04-18 NOTE — Anesthesia Procedure Notes (Signed)
Procedure Name: MAC Date/Time: 04/18/2020 9:50 AM Performed by: Renato Shin, CRNA Pre-anesthesia Checklist: Patient identified, Emergency Drugs available, Suction available and Patient being monitored Patient Re-evaluated:Patient Re-evaluated prior to induction Oxygen Delivery Method: Nasal cannula Preoxygenation: Pre-oxygenation with 100% oxygen Induction Type: IV induction Placement Confirmation: positive ETCO2 and breath sounds checked- equal and bilateral Dental Injury: Teeth and Oropharynx as per pre-operative assessment

## 2020-04-18 NOTE — Progress Notes (Signed)
Patient transported to ENDO via bed. Family member to accompany for interpretation purposes.

## 2020-04-18 NOTE — Op Note (Signed)
[facility] Patient Name: Bailey Walker Procedure Date : 04/18/2020 MRN: [id number] Attending MD: Gerrit Heck , MD Date of Birth: 22-Jul-1946 CSN: [id number] Age: 74 Admit Type: Inpatient Procedure:                Upper GI endoscopy Indications:              Generalized abdominal pain, Abnormal CT of the GI                            tract, Nausea with vomiting Providers:                Gerrit Heck, MD, Glori Bickers, RN, Laverda Sorenson, Technician, Luane School, CRNA Referring MD:              Medicines:                Monitored Anesthesia Care Complications:            No immediate complications. Estimated Blood Loss:     Estimated blood loss was minimal. Procedure:                Pre-Anesthesia Assessment:                           - Prior to the procedure, a History and Physical                            was performed, and patient medications and                            allergies were reviewed. The patient's tolerance of                            previous anesthesia was also reviewed. The risks                            and benefits of the procedure and the sedation                            options and risks were discussed with the patient.                            All questions were answered, and informed consent                            was obtained. Prior Anticoagulants: The patient has                            taken no previous anticoagulant or antiplatelet                            agents. ASA Grade Assessment: II - A patient with  mild systemic disease. After reviewing the risks                            and benefits, the patient was deemed in                            satisfactory condition to undergo the procedure.                           After obtaining informed consent, the endoscope was                            passed under direct vision. Throughout the                             procedure, the patient's blood pressure, pulse, and                            oxygen saturations were monitored continuously. The                            GIF-H190 ([id number] Olympus gastroscope was                            introduced through the mouth, and advanced to the                            second part of duodenum. The upper GI endoscopy was                            accomplished without difficulty. The patient                            tolerated the procedure well. Scope In: Scope Out: Findings:      The examined esophagus was normal. NG tube in place and terminating in       the gastric antrum. This was intentionally left in place upon endoscope       withdrawal.      Diffuse moderate inflammation characterized by congestion (edema) and       erythema was found in the gastric fundus and in the gastric body.       Biopsies were taken with a cold forceps for histology. Estimated blood       loss was minimal.      Two 4 mm sessile polyps with no bleeding and no stigmata of recent       bleeding were found in the cardia and in the gastric fundus. Biopsies       were taken with a cold forceps for histology. Estimated blood loss was       minimal.      The prepyloric region of the stomach and pylorus were normal.      The duodenal bulb, first portion of the duodenum and second portion of       the duodenum were normal. Impression:               - Normal esophagus.                           -  Gastritis. Biopsied.                           - Two gastric polyps. Biopsied.                           - Normal prepyloric region of the stomach and                            pylorus.                           - Normal duodenal bulb, first portion of the                            duodenum and second portion of the duodenum. Recommendation:           - Perform a flexible sigmoidoscopy today.                           - Continue present medications.                           - Await  pathology results. Procedure Code(s):        --- Professional ---                           [phone], Esophagogastroduodenoscopy, flexible,                            transoral; with biopsy, single or multiple Diagnosis Code(s):        --- Professional ---                           K29.70, Gastritis, unspecified, without bleeding                           K31.7, Polyp of stomach and duodenum                           R10.84, Generalized abdominal pain                           R11.2, Nausea with vomiting, unspecified                           R93.3, Abnormal findings on diagnostic imaging of                            other parts of digestive tract CPT copyright 2019 American Medical Association. All rights reserved. The codes documented in this report are preliminary and upon coder review may  be revised to meet current compliance requirements. Gerrit Heck, MD 04/18/2020 10:19:24 AM Number of Addenda: 0

## 2020-04-18 NOTE — Progress Notes (Signed)
[facility] Progress Note     Subjective: Patient denies pain currently but was still requiring pain medication every few hours overnight. Had enemas yesterday in preparation for GI procedures today. Niece at bedside and assisted with translation.   Objective: Vital signs in last 24 hours: Temp:  [98.3 F (36.8 C)-98.7 F (37.1 C)] 98.3 F (36.8 C) (07/28 0527) Pulse Rate:  [101-108] 105 (07/28 0527) Resp:  [14-19] 15 (07/28 0527) BP: (118-144)/(69-85) 143/71 (07/28 0527) SpO2:  [93 %-96 %] 93 % (07/28 0527) Last BM Date: 04/17/20  Intake/Output from previous day: 07/27 0701 - 07/28 0700 In: 30 [NG/GT:30] Out: 600 [Urine:100; Emesis/NG output:500] Intake/Output this shift: No intake/output data recorded.  PE: General: pleasant, WD, obese female who is laying in bed in NAD Heart: regular, rate, and rhythm. Palpable radial and pedal pulses bilaterally Lungs: CTAB, no wheezes, rhonchi, or rales noted.  Respiratory effort nonlabored Abd: firm, no ttp this AM, distended, +BS, NGT in place with thin clear drainage   Lab Results:  Recent Labs    04/17/20 0209 04/18/20 0447  WBC 5.6 4.0  HGB 11.6* 12.2  HCT 37.8 38.9  PLT 276 320   BMET Recent Labs    04/17/20 0209 04/18/20 0447  NA 137 138  K 3.4* 3.7  CL 106 107  CO2 21* 21*  GLUCOSE 122* 111*  BUN 8 15  CREATININE 0.59 0.62  CALCIUM 7.0* 7.5*   PT/INR No results for input(s): LABPROT, INR in the last 72 hours. CMP     Component Value Date/Time   NA 138 04/18/2020 0447   NA 139 07/12/2019 1110   K 3.7 04/18/2020 0447   CL 107 04/18/2020 0447   CO2 21 (L) 04/18/2020 0447   GLUCOSE 111 (H) 04/18/2020 0447   BUN 15 04/18/2020 0447   BUN 15 07/12/2019 1110   CREATININE 0.62 04/18/2020 0447   CREATININE 0.74 01/08/2017 1100   CALCIUM 7.5 (L) 04/18/2020 0447   PROT 6.2 (L) 04/18/2020 0447   PROT 7.1 02/11/2019 1455   ALBUMIN 3.2 (L) 04/18/2020 0447   ALBUMIN 4.4 02/11/2019 1455   AST 11  (L) 04/18/2020 0447   ALT 8 04/18/2020 0447   ALKPHOS 50 04/18/2020 0447   BILITOT 0.7 04/18/2020 0447   BILITOT <0.2 02/11/2019 1455   GFRNONAA >60 04/18/2020 0447   GFRNONAA 81 01/08/2017 1100   GFRAA >60 04/18/2020 0447   GFRAA >89 01/08/2017 1100   Lipase     Component Value Date/Time   LIPASE 19 04/16/2020 0042       Studies/Results: DG Abd 1 View  Result Date: 04/18/2020 CLINICAL DATA:  Nasogastric tube placement EXAM: ABDOMEN - 1 VIEW COMPARISON:  None. FLUOROSCOPY TIME:  3 minutes 18 seconds; 23.70 mGy; 1 acquired image FINDINGS: Nasogastric tube tip and side port are in the distal stomach. Relative paucity of gas seen on somewhat limited supine abdominal image. No free air appreciable. Visualized lung bases clear. IMPRESSION: Nasogastric tube tip and side port in distal stomach. Paucity of gas of uncertain etiology on limited study. No free air evident on current supine examination. Electronically Signed   By: Lowella Grip III M.D.   On: 04/18/2020 07:54    Anti-infectives: Anti-infectives (From admission, onward)   Start     Dose/Rate Route Frequency Ordered Stop   04/17/20 0000  cefTRIAXone (ROCEPHIN) 1 g in sodium chloride 0.9 % 100 mL IVPB        1 g 200 mL/hr over 30 Minutes  Intravenous  Once 04/16/20 1604 04/16/20 2347   04/16/20 0115  cefTRIAXone (ROCEPHIN) 1 g in sodium chloride 0.9 % 100 mL IVPB        1 g 200 mL/hr over 30 Minutes Intravenous  Once 04/16/20 0113 04/16/20 0435       Assessment/Plan HTN T2DM Hypothyroidism  LBO secondary to possible sigmoid mass Abnormality of ascending colon on CT - CEA mildly elevated at 14 - patient is having some bowel function with enemas yesterday - NGT placed in radiology yesterday - 500 cc out - GI planning EGD and possible flex sig today - will follow up for surgical planning, no emergent surgical intervention indicated  FEN: NPO, IVF VTE: SCDs, lovenox ID: [email or web address] x1  LOS: 2 days    Norm Parcel , [facility] 04/18/2020, 8:22 AM Please see Amion for pager number during day hours 7:00am-4:30pm

## 2020-04-18 NOTE — Anesthesia Postprocedure Evaluation (Signed)
Anesthesia Post Note  Patient: Bailey Walker  Procedure(s) Performed: ESOPHAGOGASTRODUODENOSCOPY (EGD) WITH PROPOFOL (N/A ) FLEXIBLE SIGMOIDOSCOPY (N/A ) BIOPSY     Patient location during evaluation: Endoscopy Anesthesia Type: MAC Level of consciousness: awake and alert Pain management: pain level controlled Vital Signs Assessment: post-procedure vital signs reviewed and stable Respiratory status: spontaneous breathing, nonlabored ventilation and respiratory function stable Cardiovascular status: blood pressure returned to baseline and stable Postop Assessment: no apparent nausea or vomiting Anesthetic complications: no   No complications documented.  Last Vitals:  Vitals:   04/18/20 1032 04/18/20 1040  BP: (!) 132/106 (!) 159/48  Pulse: 95 96  Resp: (!) 11 12  Temp:    SpO2: 97% 97%    Last Pain:  Vitals:   04/18/20 1040  TempSrc:   PainSc: 0-No pain                 Milany Geck,W. EDMOND

## 2020-04-18 NOTE — Anesthesia Preprocedure Evaluation (Addendum)
Anesthesia Evaluation  Patient identified by MRN, date of birth, ID band Patient awake    Reviewed: Allergy & Precautions, H&P , NPO status , Patient's Chart, lab work & pertinent test results, reviewed documented beta blocker date and time   Airway Mallampati: II  TM Distance: >3 FB Neck ROM: Full    Dental no notable dental hx. (+) Edentulous Upper, Edentulous Lower, Dental Advisory Given   Pulmonary neg pulmonary ROS, former smoker,    Pulmonary exam normal breath sounds clear to auscultation       Cardiovascular hypertension, Pt. on medications and Pt. on home beta blockers  Rhythm:Regular Rate:Normal     Neuro/Psych negative neurological ROS  negative psych ROS   GI/Hepatic negative GI ROS, Neg liver ROS,   Endo/Other  diabetes, Type 2, Oral Hypoglycemic AgentsHypothyroidism   Renal/GU negative Renal ROS  negative genitourinary   Musculoskeletal   Abdominal   Peds  Hematology  (+) Blood dyscrasia, anemia ,   Anesthesia Other Findings   Reproductive/Obstetrics negative OB ROS                            Anesthesia Physical Anesthesia Plan  ASA: II  Anesthesia Plan: MAC   Post-op Pain Management:    Induction: Intravenous  PONV Risk Score and Plan: 2 and Propofol infusion  Airway Management Planned: Nasal Cannula  Additional Equipment:   Intra-op Plan:   Post-operative Plan:   Informed Consent: I have reviewed the patients History and Physical, chart, labs and discussed the procedure including the risks, benefits and alternatives for the proposed anesthesia with the patient or authorized representative who has indicated his/her understanding and acceptance.     Dental advisory given  Plan Discussed with: CRNA  Anesthesia Plan Comments:         Anesthesia Quick Evaluation

## 2020-04-18 NOTE — Progress Notes (Signed)
Patient returned to room from ENDO. Alert and oriented. NAD. Patient updated as to plan of care. No questions at this time.

## 2020-04-19 ENCOUNTER — Inpatient Hospital Stay (HOSPITAL_COMMUNITY): Payer: Medicaid Other | Admitting: Certified Registered"

## 2020-04-19 ENCOUNTER — Encounter (HOSPITAL_COMMUNITY)
Admission: EM | Disposition: A | Discharge status: Home Health | Payer: Self-pay | Source: Home / Self Care | Attending: Internal Medicine

## 2020-04-19 ENCOUNTER — Encounter (HOSPITAL_COMMUNITY): Payer: Self-pay | Admitting: Family Medicine

## 2020-04-19 ENCOUNTER — Other Ambulatory Visit: Payer: Self-pay

## 2020-04-19 ENCOUNTER — Other Ambulatory Visit: Payer: Self-pay | Admitting: Physician Assistant

## 2020-04-19 DIAGNOSIS — C187 Malignant neoplasm of sigmoid colon: Secondary | ICD-10-CM

## 2020-04-19 HISTORY — PX: FLEXIBLE SIGMOIDOSCOPY: SHX5431

## 2020-04-19 HISTORY — PX: BIOPSY: SHX5522

## 2020-04-19 HISTORY — PX: SUBMUCOSAL TATTOO INJECTION: SHX6856

## 2020-04-19 LAB — COMPREHENSIVE METABOLIC PANEL
ALT: 10 U/L (ref 0–44)
AST: 11 U/L — ABNORMAL LOW (ref 15–41)
Albumin: 3.2 g/dL — ABNORMAL LOW (ref 3.5–5.0)
Alkaline Phosphatase: 40 U/L (ref 38–126)
Anion gap: 10 (ref 5–15)
BUN: 12 mg/dL (ref 8–23)
CO2: 22 mmol/L (ref 22–32)
Calcium: 7.2 mg/dL — ABNORMAL LOW (ref 8.9–10.3)
Chloride: 105 mmol/L (ref 98–111)
Creatinine, Ser: 0.57 mg/dL (ref 0.44–1.00)
GFR calc Af Amer: >60 mL/min (ref >60)
GFR calc non Af Amer: >60 mL/min (ref >60)
Glucose, Bld: 164 mg/dL — ABNORMAL HIGH (ref 70–99)
Potassium: 2.6 mmol/L — CL (ref 3.5–5.1)
Sodium: 137 mmol/L (ref 135–145)
Total Bilirubin: 0.7 mg/dL (ref 0.3–1.2)
Total Protein: 5.8 g/dL — ABNORMAL LOW (ref 6.5–8.1)

## 2020-04-19 LAB — GLUCOSE, CAPILLARY
Glucose-Capillary: 103 mg/dL — ABNORMAL HIGH (ref 70–99)
Glucose-Capillary: 113 mg/dL — ABNORMAL HIGH (ref 70–99)
Glucose-Capillary: 130 mg/dL — ABNORMAL HIGH (ref 70–99)
Glucose-Capillary: 137 mg/dL — ABNORMAL HIGH (ref 70–99)
Glucose-Capillary: 68 mg/dL — ABNORMAL LOW (ref 70–99)
Glucose-Capillary: 77 mg/dL (ref 70–99)
Glucose-Capillary: 86 mg/dL (ref 70–99)
Glucose-Capillary: 86 mg/dL (ref 70–99)
Glucose-Capillary: 88 mg/dL (ref 70–99)
Glucose-Capillary: 98 mg/dL (ref 70–99)

## 2020-04-19 LAB — CBC WITH DIFFERENTIAL/PLATELET
Abs Immature Granulocytes: 0.01 10*3/uL (ref 0.00–0.07)
Basophils Absolute: 0 10*3/uL (ref 0.0–0.1)
Basophils Relative: 1 %
Eosinophils Absolute: 0.1 10*3/uL (ref 0.0–0.5)
Eosinophils Relative: 4 %
HCT: 32.8 % — ABNORMAL LOW (ref 36.0–46.0)
Hemoglobin: 10.3 g/dL — ABNORMAL LOW (ref 12.0–15.0)
Immature Granulocytes: 0 %
Lymphocytes Relative: 40 %
Lymphs Abs: 1.4 10*3/uL (ref 0.7–4.0)
MCH: 28.9 pg (ref 26.0–34.0)
MCHC: 31.4 g/dL (ref 30.0–36.0)
MCV: 91.9 fL (ref 80.0–100.0)
Monocytes Absolute: 0.6 10*3/uL (ref 0.1–1.0)
Monocytes Relative: 18 %
Neutro Abs: 1.3 10*3/uL — ABNORMAL LOW (ref 1.7–7.7)
Neutrophils Relative %: 37 %
Platelets: 258 10*3/uL (ref 150–400)
RBC: 3.57 MIL/uL — ABNORMAL LOW (ref 3.87–5.11)
RDW: 16.4 % — ABNORMAL HIGH (ref 11.5–15.5)
WBC: 3.5 10*3/uL — ABNORMAL LOW (ref 4.0–10.5)
nRBC: 0 % (ref 0.0–0.2)

## 2020-04-19 LAB — MAGNESIUM: Magnesium: 2 mg/dL (ref 1.7–2.4)

## 2020-04-19 LAB — PHOSPHORUS: Phosphorus: 1.5 mg/dL — ABNORMAL LOW (ref 2.5–4.6)

## 2020-04-19 SURGERY — SIGMOIDOSCOPY, FLEXIBLE
Anesthesia: Monitor Anesthesia Care

## 2020-04-19 MED ORDER — POTASSIUM CHLORIDE CRYS ER 20 MEQ PO TBCR
40.0000 meq | EXTENDED_RELEASE_TABLET | Freq: Once | ORAL | Status: AC
  Administered 2020-04-19: 40 meq via ORAL
  Filled 2020-04-19: qty 2

## 2020-04-19 MED ORDER — LORAZEPAM 2 MG/ML IJ SOLN: 1.0000 mg | Freq: Once | INTRAMUSCULAR | Status: DC | PRN

## 2020-04-19 MED ORDER — SODIUM CHLORIDE 0.9 % IV SOLN
2.0000 g | Freq: Three times a day (TID) | INTRAVENOUS | Status: DC
  Administered 2020-04-19 – 2020-04-20 (×2): 2 g via INTRAVENOUS
  Filled 2020-04-19 (×4): qty 2

## 2020-04-19 MED ORDER — LEVOTHYROXINE SODIUM 100 MCG/5ML IV SOLN
37.5000 ug | Freq: Every day | INTRAVENOUS | Status: DC
  Administered 2020-04-19 – 2020-04-24 (×5): 37.5 ug via INTRAVENOUS
  Filled 2020-04-19 (×6): qty 5

## 2020-04-19 MED ORDER — K PHOS MONO-SOD PHOS DI & MONO 155-852-130 MG PO TABS
250.0000 mg | ORAL_TABLET | Freq: Two times a day (BID) | ORAL | Status: DC
  Filled 2020-04-19 (×2): qty 1

## 2020-04-19 MED ORDER — SPOT INK MARKER SYRINGE KIT
PACK | SUBMUCOSAL | Status: DC | PRN
  Administered 2020-04-19: 2.5 mL via SUBMUCOSAL

## 2020-04-19 MED ORDER — PANTOPRAZOLE SODIUM 40 MG IV SOLR: 40.0000 mg | INTRAVENOUS | Status: DC

## 2020-04-19 MED ORDER — DEXTROSE 50 % IV SOLN
12.5000 g | INTRAVENOUS | Status: AC
  Administered 2020-04-19: 12.5 g via INTRAVENOUS
  Filled 2020-04-19: qty 50

## 2020-04-19 MED ORDER — DEXTROSE 50 % IV SOLN
12.5000 g | INTRAVENOUS | Status: AC
  Administered 2020-04-19: 12.5 g via INTRAVENOUS
  Filled 2020-04-19 (×2): qty 50

## 2020-04-19 MED ORDER — METOPROLOL TARTRATE 5 MG/5ML IV SOLN
5.0000 mg | Freq: Four times a day (QID) | INTRAVENOUS | Status: DC
  Administered 2020-04-19 – 2020-04-24 (×18): 5 mg via INTRAVENOUS
  Filled 2020-04-19 (×19): qty 5

## 2020-04-19 MED ORDER — PANTOPRAZOLE SODIUM 40 MG IV SOLR
40.0000 mg | INTRAVENOUS | Status: DC
  Administered 2020-04-21 – 2020-04-24 (×4): 40 mg via INTRAVENOUS
  Filled 2020-04-19 (×5): qty 40

## 2020-04-19 MED ORDER — SPOT INK MARKER SYRINGE KIT
PACK | SUBMUCOSAL | Status: AC
  Filled 2020-04-19: qty 5

## 2020-04-19 MED ORDER — POTASSIUM CHLORIDE 10 MEQ/100ML IV SOLN
10.0000 meq | INTRAVENOUS | Status: AC
  Administered 2020-04-19 (×3): 10 meq via INTRAVENOUS
  Filled 2020-04-19 (×2): qty 100

## 2020-04-19 MED ORDER — PROPOFOL 500 MG/50ML IV EMUL
INTRAVENOUS | Status: DC | PRN
  Administered 2020-04-19: 100 ug/kg/min via INTRAVENOUS

## 2020-04-19 NOTE — Anesthesia Procedure Notes (Signed)
Procedure Name: MAC Date/Time: 04/19/2020 10:08 AM Performed by: Lowella Dell, CRNA Pre-anesthesia Checklist: Patient identified, Emergency Drugs available, Suction available, Patient being monitored and Timeout performed Patient Re-evaluated:Patient Re-evaluated prior to induction Oxygen Delivery Method: Simple face mask Placement Confirmation: positive ETCO2 Dental Injury: Teeth and Oropharynx as per pre-operative assessment

## 2020-04-19 NOTE — Interval H&P Note (Signed)
History and Physical Interval Note:  04/19/2020 9:20 AM  Bailey Walker  has presented today for surgery, with the diagnosis of bowel obstruction.  The various methods of treatment have been discussed with the patient and family. After consideration of risks, benefits and other options for treatment, the patient has consented to  Procedure(s): FLEXIBLE SIGMOIDOSCOPY (N/A) as a surgical intervention.  The patient's history has been reviewed, patient examined, no change in status, stable for surgery.  I have reviewed the patient's chart and labs.  Questions were answered to the patient's satisfaction.     Dominic Pea Jerelle Virden

## 2020-04-19 NOTE — Progress Notes (Addendum)
[facility] Progress Note  Day of Surgery  Subjective: Denies pain, niece at bedside and patient prefers that she translate. denies flatus. Some BM after enema. Had flex sig this AM notable for complete obstruction of sigmoid colon.  Objective: Vital signs in last 24 hours: Temp:  [97.5 F (36.4 C)-99.3 F (37.4 C)] 98.6 F (37 C) (07/29 1050) Pulse Rate:  [70-96] 72 (07/29 1120) Resp:  [13-18] 18 (07/29 1120) BP: (110-157)/(49-98) 131/51 (07/29 1120) SpO2:  [94 %-98 %] 98 % (07/29 1120) Weight:  [72.6 kg] 72.6 kg (07/29 0840) Last BM Date: 04/19/20  Intake/Output from previous day: 07/28 0701 - 07/29 0700 In: 3125.4 [P.O.:60; I.V.:2715.4; IV Piggyback:350] Out: 900 [Urine:400; Emesis/NG output:500] Intake/Output this shift: Total I/O In: 1226.7 [I.V.:1020.5; IV Piggyback:206.2] Out: -   PE: General: pleasant, WD, obese female who is laying in bed in NAD Heart: regular, rate, and rhythm. Palpable radial and pedal pulses bilaterally Lungs: CTAB, no wheezes, rhonchi, or rales noted.  Respiratory effort nonlabored Abd: firm, no ttp, distended, NGT in place clamped   Lab Results:  Recent Labs    04/18/20 0447 04/19/20 0134  WBC 4.0 3.5*  HGB 12.2 10.3*  HCT 38.9 32.8*  PLT 320 258   BMET Recent Labs    04/18/20 0447 04/19/20 0134  NA 138 137  K 3.7 2.6*  CL 107 105  CO2 21* 22  GLUCOSE 111* 164*  BUN 15 12  CREATININE 0.62 0.57  CALCIUM 7.5* 7.2*   PT/INR No results for input(s): LABPROT, INR in the last 72 hours. CMP     Component Value Date/Time   NA 137 04/19/2020 0134   NA 139 07/12/2019 1110   K 2.6 (LL) 04/19/2020 0134   CL 105 04/19/2020 0134   CO2 22 04/19/2020 0134   GLUCOSE 164 (H) 04/19/2020 0134   BUN 12 04/19/2020 0134   BUN 15 07/12/2019 1110   CREATININE 0.57 04/19/2020 0134   CREATININE 0.74 01/08/2017 1100   CALCIUM 7.2 (L) 04/19/2020 0134   PROT 5.8 (L) 04/19/2020 0134   PROT 7.1 02/11/2019 1455   ALBUMIN 3.2 (L)  04/19/2020 0134   ALBUMIN 4.4 02/11/2019 1455   AST 11 (L) 04/19/2020 0134   ALT 10 04/19/2020 0134   ALKPHOS 40 04/19/2020 0134   BILITOT 0.7 04/19/2020 0134   BILITOT <0.2 02/11/2019 1455   GFRNONAA >60 04/19/2020 0134   GFRNONAA 81 01/08/2017 1100   GFRAA >60 04/19/2020 0134   GFRAA >89 01/08/2017 1100   Lipase     Component Value Date/Time   LIPASE 19 04/16/2020 0042       Studies/Results: DG Abd 1 View  Result Date: 04/18/2020 CLINICAL DATA:  Nasogastric tube placement EXAM: ABDOMEN - 1 VIEW COMPARISON:  None. FLUOROSCOPY TIME:  3 minutes 18 seconds; 23.70 mGy; 1 acquired image FINDINGS: Nasogastric tube tip and side port are in the distal stomach. Relative paucity of gas seen on somewhat limited supine abdominal image. No free air appreciable. Visualized lung bases clear. IMPRESSION: Nasogastric tube tip and side port in distal stomach. Paucity of gas of uncertain etiology on limited study. No free air evident on current supine examination. Electronically Signed   By: Lowella Grip III M.D.   On: 04/18/2020 07:54    Anti-infectives: Anti-infectives (From admission, onward)   Start     Dose/Rate Route Frequency Ordered Stop   04/17/20 0000  cefTRIAXone (ROCEPHIN) 1 g in sodium chloride 0.9 % 100 mL IVPB  1 g 200 mL/hr over 30 Minutes Intravenous  Once 04/16/20 1604 04/16/20 2347   04/16/20 0115  cefTRIAXone (ROCEPHIN) 1 g in sodium chloride 0.9 % 100 mL IVPB        1 g 200 mL/hr over 30 Minutes Intravenous  Once 04/16/20 0113 04/16/20 0435       Assessment/Plan HTN T2DM Hypothyroidism  LBO secondary to possible sigmoid mass Abnormality of ascending colon on CT - CEA mildly elevated at 14 - NGT placed in radiology 7/27- 500 cc out - GI repeated flex sig today 7/29 after enema prep, found a completely obstructing sigmoid mass approx 28 cm from the anal verge, not traversable, biopsied, tatooed 2-3 cm distal to mass. - due to complete obstruction I think  patient will require partial colectomy/colostomy, will confirm surgical plan with attending, Dr. Ninfa Linden. Depending on intra-operative findings, subtotal colectomy with ostomy formation or diverting colostomy are also possible surgical outcomes. - Agree with oncology consult once path results. Will follow Chest CT/repeat CEA.  FEN: NPO, IVF, NG LIWS VTE: SCDs, lovenox ID: [email or web address] x1   LOS: 3 days    Jill Alexanders , [facility] 04/19/2020, 12:42 PM Please see Amion for pager number during day hours 7:00am-4:30pm

## 2020-04-19 NOTE — Progress Notes (Signed)
Nutrition Follow-up  DOCUMENTATION CODES:   Obesity unspecified  INTERVENTION:   -RD will follow for diet advancement and add supplement as tolerance -If prolonged NPO status is anticipated, consider iniation of nutrition support  NUTRITION DIAGNOSIS:   Inadequate oral intake related to altered GI function as evidenced by NPO status.  Ongoing  GOAL:   Patient will meet greater than or equal to 90% of their needs  Unmet  MONITOR:   Diet advancement, Labs, Weight trends, Skin, I & O's  REASON FOR ASSESSMENT:   Malnutrition Screening Tool    ASSESSMENT:   Bailey Walker is a 74 y.o. female with medical history significant of hypertension, type 2 diabetes mellitus, hypothyroidism, obesity presents to emergency department with abdominal pain and vomiting since 2 days  7/29- s/p flex sig- revealed complete obstruction of sigmoid colon (biopsied)  Reviewed I/O's: +2.2 L x 24 hours and +2.6 L since admission  UOP: 400 ml x 24 hours  NGT: 500 ml x 24 hours  Pt out of room at time of visit.   Per general surgery notes, awaiting biopsies and oncology consults. Pt may require a partial colectomy and colostomy.   Medications reviewed and include 0.9% sodium chloride infusion @ 75 ml/hr, dextrose 5%-0.45% sodium chloride infusion @ 75 ml/hr, and lactated ringers infusion @ 20 ml/hr.   Labs reviewed: CBGS: 86-130 (inpatient orders for glycemic control are 0-20 units insulin aspart every 4 hours).   Diet Order:   Diet Order            Diet NPO time specified Except for: Sips with Meds  Diet effective now                 EDUCATION NEEDS:   No education needs have been identified at this time  Skin:  Skin Assessment: Reviewed RN Assessment  Last BM:  04/19/20  Height:   Ht Readings from Last 1 Encounters:  04/19/20 5\' 1"  (1.549 m)    Weight:   Wt Readings from Last 1 Encounters:  04/19/20 72.6 kg    Ideal Body Weight:  47.7 kg  BMI:  Body mass  index is 30.24 kg/m.  Estimated Nutritional Needs:   Kcal:  1600-1800  Protein:  85-100 grams  Fluid:  > 1.6 L    Loistine Chance, RD, LDN, Toivo Bordon Registered Dietitian II Certified Diabetes Care and Education Specialist Please refer to [facility] for RD and/or RD on-call/weekend/after hours pager

## 2020-04-19 NOTE — Anesthesia Preprocedure Evaluation (Signed)
Anesthesia Evaluation  Patient identified by MRN, date of birth, ID band Patient awake    Reviewed: Allergy & Precautions, H&P , NPO status , Patient's Chart, lab work & pertinent test results, reviewed documented beta blocker date and time   Airway Mallampati: II  TM Distance: >3 FB Neck ROM: Full    Dental no notable dental hx. (+) Edentulous Upper, Edentulous Lower   Pulmonary neg pulmonary ROS, former smoker,    Pulmonary exam normal breath sounds clear to auscultation       Cardiovascular hypertension, Pt. on medications and Pt. on home beta blockers + dysrhythmias Supra Ventricular Tachycardia  Rhythm:Regular Rate:Normal     Neuro/Psych negative neurological ROS  negative psych ROS   GI/Hepatic Neg liver ROS, Suspected colorectal tumor   Endo/Other  diabetes, Type 2, Oral Hypoglycemic AgentsHypothyroidism   Renal/GU negative Renal ROS  negative genitourinary   Musculoskeletal   Abdominal   Peds  Hematology  (+) Blood dyscrasia, anemia ,   Anesthesia Other Findings   Reproductive/Obstetrics negative OB ROS                            Anesthesia Physical  Anesthesia Plan  ASA: III  Anesthesia Plan: MAC   Post-op Pain Management:    Induction: Intravenous  PONV Risk Score and Plan: 2 and Propofol infusion and Treatment may vary due to age or medical condition  Airway Management Planned: Nasal Cannula  Additional Equipment: None  Intra-op Plan:   Post-operative Plan:   Informed Consent: I have reviewed the patients History and Physical, chart, labs and discussed the procedure including the risks, benefits and alternatives for the proposed anesthesia with the patient or authorized representative who has indicated his/her understanding and acceptance.       Plan Discussed with:   Anesthesia Plan Comments:         Anesthesia Quick Evaluation

## 2020-04-19 NOTE — Transfer of Care (Signed)
Immediate Anesthesia Transfer of Care Note  Patient: Bailey Walker  Procedure(s) Performed: FLEXIBLE SIGMOIDOSCOPY (N/A ) BIOPSY SUBMUCOSAL TATTOO INJECTION  Patient Location: PACU and Endoscopy Unit  Anesthesia Type:MAC  Level of Consciousness: awake and patient cooperative  Airway & Oxygen Therapy: Patient Spontanous Breathing and Patient connected to face mask oxygen  Post-op Assessment: Report given to RN and Post -op Vital signs reviewed and stable  Post vital signs: Reviewed and stable  Last Vitals:  Vitals Value Taken Time  BP    Temp    Pulse    Resp    SpO2      Last Pain:  Vitals:   04/19/20 0840  TempSrc: Temporal  PainSc: 0-No pain         Complications: No complications documented.

## 2020-04-19 NOTE — Significant Event (Addendum)
BG at 0442 resulted att 68.  Pt is NPO at this time with page sent to on call [facility] Edwards MD regarding hypoglycemia protocol for BG correction @0450 .  Awaiting response.  9483: Pt received 1x STAT order for 12.5g of D50.  Pt received ordered dose @0534 .  Upon re-assessment after intervention patient's BG improved to 130.  Will re-assess in one hour.

## 2020-04-19 NOTE — Op Note (Signed)
[facility] Patient Name: Bailey Walker Procedure Date : 04/19/2020 MRN: [id number] Attending MD: Gerrit Heck , MD Date of Birth: 1946-01-12 CSN: [id number] Age: 74 Admit Type: Inpatient Procedure:                Flexible Sigmoidoscopy Indications:              Lower abdominal pain, Abnormal CT of the GI tract,                            Change in bowel habits Providers:                Gerrit Heck, MD, Wynonia Sours, RN, Tyna Jaksch Technician Referring MD:              Medicines:                Monitored Anesthesia Care Complications:            No immediate complications. Estimated Blood Loss:     Estimated blood loss was minimal. Procedure:                Pre-Anesthesia Assessment:                           - Prior to the procedure, a History and Physical                            was performed, and patient medications and                            allergies were reviewed. The patient's tolerance of                            previous anesthesia was also reviewed. The risks                            and benefits of the procedure and the sedation                            options and risks were discussed with the patient.                            All questions were answered, and informed consent                            was obtained. Prior Anticoagulants: The patient has                            taken no previous anticoagulant or antiplatelet                            agents. ASA Grade Assessment: II - A patient with  mild systemic disease. After reviewing the risks                            and benefits, the patient was deemed in                            satisfactory condition to undergo the procedure.                           After obtaining informed consent, the scope was                            passed under direct vision. The PCF-H190DL                            ([id number] Olympus  pediatric colonscope was                            introduced through the anus and advanced to the the                            sigmoid colon. The flexible sigmoidoscopy was                            technically difficult and complex due to poor bowel                            prep. The quality of the bowel preparation was poor. Scope In: 10:11:47 AM Scope Out: 10:32:47 AM Scope Withdrawal Time: 0 hours 1 minute 56 seconds  Total Procedure Duration: 0 hours 21 minutes 0 seconds  Findings:      Hemorrhoids were found on perianal exam.      A frond-like/villous and fungating completely obstructing large mass was       found in the sigmoid colon, located approximately 28 cm from the anal       verge. This was non-traversable. This was biopsied with a cold forceps       for histology. Area 2-3 cm distal to the mass was tattooed with an       injection of 2 mL of Spot (carbon black). Estimated blood loss was       minimal.      A large amount of solid stool was found in the rectum, in the       recto-sigmoid colon and in the sigmoid colon, precluding visualization.       Lavage of the area was performed using copious amounts of sterile water,       resulting in incomplete clearance with continued poor visualization. Impression:               - Preparation of the colon was poor.                           - Hemorrhoids found on perianal exam.                           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 Tattooed.                           - Stool in the rectum, in the recto-sigmoid colon                            and in the sigmoid colon. Recommendation:           - Return patient to hospital ward for ongoing care.                           - Continue present medications.                           - Await pathology results.                           - Check CEA today.                           - Surgical service already  following patient.                           - Consult Oncology once pathology results have                            returned.                           - To remain NPO for now.                           - MRI Pelvis for further staging of noted lymph                            nodes in this area on admission CT.                           - CT Chest to complete staging.                           - I discussed these results with patient and her                            family member at bedside. 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 All rights reserved. The codes documented in this report are preliminary and upon coder review may  be revised to meet current compliance requirements. Gerrit Heck, MD 04/19/2020 10:50:39 AM Number of Addenda: 0

## 2020-04-19 NOTE — Progress Notes (Signed)
PROGRESS NOTE    Bailey Walker  ZJI:[id number] DOB: 03/07/1946 DOA: 04/15/2020 PCP: [name], MD    Brief Narrative:  74 y.o. female with medical history significant of hypertension, type 2 diabetes mellitus, hypothyroidism, obesity presents to emergency department with abdominal pain and vomiting since 2 days.  Patient speaks Arabic.  History gathered from patient's daughter at the bedside.  She mentioned that patient has severe generalized abdominal pain and vomiting since July 4 however it got worse since 2 days.  She could not keep anything down, she has 10 out of 10, nonradiating, sharp pain, no aggravating or relieving factors, associated with vomiting 4 times since yesterday.  Vomitus is nonbloody.  She has lost about 7 to 10 pounds since July for the due to abdominal pain and vomiting.  No history of colon cancer in the family.  No night sweats, melena, fever, chills, headache, blurry vision, chest pain, shortness of breath, palpitation, leg swelling.  Reports dysuria since few days.  No foul-smelling or change in urinary frequency, lower back pain.  She had regular bowel movement this morning.  She is a former smoker.  No history of alcohol, illicit drug use.  ED Course: Upon arrival to ED: Patient's vital signs stable.  She received IV fluid bolus, Protonix, Zofran, Dilaudid as needed for pain control and Rocephin for possible UTI in ED.  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  Patient transferred to [facility] for further evaluation and management.  Assessment & Plan:   Principal Problem:   Colon obstruction ([place]) Active Problems:   Hypothyroidism   Essential hypertension   Diabetes mellitus due to underlying condition with unspecified complications ([place])   UTI (urinary tract infection)   Abdominal distension   Colonic mass   Nausea and vomiting in adult   Abnormal CT scan, gastrointestinal tract    Gastritis and gastroduodenitis   Grade II internal hemorrhoids  Abdominal pain with vomiting secondary to obstructing colonic mass: -Concern for possible underlying obstructive colorectal carcinoma. -Patient's vital signs remain stable.  She is afebrile with no leukocytosis.  COVID-19 negative.  Lipase: WNL. -continue IVF hydration for now -GI following. Discussed with Dr. Bryan Lemma. Gastritis noted on EGD. Flex sig now confirming obstructing colonic mass -Appreciate General Surgery input. Plan for surgery tomorrow for partial colectomy/colostomy -Have ordered CT chest and MRI pelvis to complete staging -Of note, CEA is elevated at 14.1  UTI: Patient reports dysuria.  UA is positive for leukocyte/bacteria/WBC -Urine culture is pending.  She received Rocephin in ED.  Continued on rocephin.  Follow pt's urine culture.  Hypertension:  -metoprolol initially held given soft BP at time of admit -BP now improved on beta blocker   Hypothyroidism: Check TSH in AM -Continue levothyroxine, have transitioned to IV formulation given pending bowel surgery  Type 2 diabetes mellitus: Hold glipizide and Metformin.  Started patient on sliding scale insulin and monitor blood sugar closely  Iron deficiency anemia: H&H is stable at this time -Continue ferrous sulfate -Followed by hematology as outpatient Dr. Alvy Bimler  DVT prophylaxis: Lovenox subq Code Status: Full Family Communication: Pt in room, family at bedside  Status is: Inpatient  Remains inpatient appropriate because:Ongoing diagnostic testing needed not appropriate for outpatient work up   Dispo: The patient is from: Home              Anticipated d/c is to: Home              Anticipated d/c  date is: 2 days              Patient currently is not medically stable to d/c.       Consultants:   GI  General Surgery  Procedures:   EGD, flex sig 7/28  Antimicrobials: Anti-infectives (From admission, onward)   Start      Dose/Rate Route Frequency Ordered Stop   04/17/20 0000  cefTRIAXone (ROCEPHIN) 1 g in sodium chloride 0.9 % 100 mL IVPB        1 g 200 mL/hr over 30 Minutes Intravenous  Once 04/16/20 1604 04/16/20 2347   04/16/20 0115  cefTRIAXone (ROCEPHIN) 1 g in sodium chloride 0.9 % 100 mL IVPB        1 g 200 mL/hr over 30 Minutes Intravenous  Once 04/16/20 0113 04/16/20 0435      Subjective: Feeling anxious and saddened about confirmation of colonic mass   Objective: Vitals:   04/19/20 1050 04/19/20 1100 04/19/20 1110 04/19/20 1120  BP: (!) 113/49 (!) 130/98 (!) 110/56 (!) 131/51  Pulse: 71 70 70 72  Resp: 18 15 16 18   Temp: 98.6 F (37 C)     TempSrc: Axillary     SpO2: 96% 97% 94% 98%  Weight:      Height:        Intake/Output Summary (Last 24 hours) at 04/19/2020 1643 Last data filed at 04/19/2020 1228 Gross per 24 hour  Intake 1326.7 ml  Output 700 ml  Net 626.7 ml   Filed Weights   04/15/20 1956 04/16/20 1805 04/19/20 0840  Weight: 72.9 kg 72.6 kg 72.6 kg    Examination: General exam: Awake, laying in bed, in nad Respiratory system: Normal respiratory effort, no wheezing Cardiovascular system: regular rate, s1, s2 Gastrointestinal system: Soft, nondistended, positive BS Central nervous system: CN2-12 grossly intact, strength intact Extremities: Perfused, no clubbing Skin: Normal skin turgor, no notable skin lesions seen Psychiatry: Mood sad // no visual hallucinations    Data Reviewed: I have personally reviewed following labs and imaging studies  CBC: Recent Labs  Lab 04/16/20 0042 04/16/20 1559 04/17/20 0209 04/18/20 0447 04/19/20 0134  WBC 8.3 5.9 5.6 4.0 3.5*  NEUTROABS  --   --   --  1.5* 1.3*  HGB 13.0 12.2 11.6* 12.2 10.3*  HCT 40.7 38.3 37.8 38.9 32.8*  MCV 89.1 89.3 90.6 90.3 91.9  PLT 316 310 276 320 706   Basic Metabolic Panel: Recent Labs  Lab 04/16/20 0042 04/16/20 1559 04/17/20 0209 04/18/20 0447 04/19/20 0134  NA 137  --  137 138 137    K 3.6  --  3.4* 3.7 2.6*  CL 102  --  106 107 105  CO2 21*  --  21* 21* 22  GLUCOSE 142*  --  122* 111* 164*  BUN 10  --  8 15 12   CREATININE 0.48 0.58 0.59 0.62 0.57  CALCIUM 7.7*  --  7.0* 7.5* 7.2*  MG  --  1.9  --  2.2 2.0  PHOS  --   --   --  2.5 1.5*   GFR: Estimated Creatinine Clearance: 54.5 mL/min (by C-G formula based on SCr of 0.57 mg/dL). Liver Function Tests: Recent Labs  Lab 04/16/20 0042 04/17/20 0209 04/18/20 0447 04/19/20 0134  AST 16 10* 11* 11*  ALT 11 9 8 10   ALKPHOS 58 47 50 40  BILITOT 0.5 0.7 0.7 0.7  PROT 7.9 6.3* 6.2* 5.8*  ALBUMIN 4.3 3.3* 3.2* 3.2*  Recent Labs  Lab 04/16/20 0042  LIPASE 19   No results for input(s): AMMONIA in the last 168 hours. Coagulation Profile: No results for input(s): INR, PROTIME in the last 168 hours. Cardiac Enzymes: No results for input(s): CKTOTAL, CKMB, CKMBINDEX, TROPONINI in the last 168 hours. BNP (last 3 results) No results for input(s): PROBNP in the last 8760 hours. HbA1C: No results for input(s): HGBA1C in the last 72 hours. CBG: Recent Labs  Lab 04/19/20 0442 04/19/20 0604 04/19/20 0801 04/19/20 0838 04/19/20 1207  GLUCAP 68* 130* 88 86 86   Lipid Profile: No results for input(s): CHOL, HDL, LDLCALC, TRIG, CHOLHDL, LDLDIRECT in the last 72 hours. Thyroid Function Tests: No results for input(s): TSH, T4TOTAL, FREET4, T3FREE, THYROIDAB in the last 72 hours. Anemia Panel: No results for input(s): VITAMINB12, FOLATE, FERRITIN, TIBC, IRON, RETICCTPCT in the last 72 hours. Sepsis Labs: No results for input(s): PROCALCITON, LATICACIDVEN in the last 168 hours.  Recent Results (from the past 240 hour(s))  Urine Culture     Status: Abnormal   Collection Time: 04/16/20  1:13 AM   Specimen: Urine, Random  Result Value Ref Range Status   Specimen Description   Final    URINE, RANDOM Performed at [facility], [address] [place], [place] [address]    Special Requests   Final     NONE Performed at [facility], [address] [place], [place] [address]    Culture MULTIPLE SPECIES PRESENT, SUGGEST RECOLLECTION (A)  Final   Report Status 04/17/2020 FINAL  Final  SARS Coronavirus 2 by RT PCR (hospital order, performed in [facility] hospital lab) Nasopharyngeal Nasopharyngeal Swab     Status: None   Collection Time: 04/16/20  4:27 AM   Specimen: Nasopharyngeal Swab  Result Value Ref Range Status   SARS Coronavirus 2 NEGATIVE NEGATIVE Final    Comment: (NOTE) SARS-CoV-2 target nucleic acids are NOT DETECTED.  The SARS-CoV-2 RNA is generally detectable in upper and lower respiratory specimens during the acute phase of infection. The lowest concentration of SARS-CoV-2 viral copies this assay can detect is 250 copies / mL. A negative result does not preclude SARS-CoV-2 infection and should not be used as the sole basis for treatment or other patient management decisions.  A negative result may occur with improper specimen collection / handling, submission of specimen other than nasopharyngeal swab, presence of viral mutation(s) within the areas targeted by this assay, and inadequate number of viral copies (<250 copies / mL). A negative result must be combined with clinical observations, patient history, and epidemiological information.  Fact Sheet for Patients:   StrictlyIdeas.no  Fact Sheet for Healthcare Providers: BankingDealers.co.za  This test is not yet approved or  cleared by the [place] FDA and has been authorized for detection and/or diagnosis of SARS-CoV-2 by FDA under an Emergency Use Authorization (EUA).  This EUA will remain in effect (meaning this test can be used) for the duration of the COVID-19 declaration under Section 564(b)(1) of the Act, 21 U.S.C. section 360bbb-3(b)(1), unless the authorization is terminated or revoked sooner.  Performed at [facility], [address] [place], [place] [address]      Radiology Studies: No results found.  Scheduled Meds: . aspirin EC  81 mg Oral Daily  . bisacodyl  10 mg Oral BID  . enoxaparin (LOVENOX) injection  40 mg Subcutaneous Q24H  . insulin aspart  0-20 Units Subcutaneous Q4H  . levothyroxine  75 mcg Oral QAC breakfast  . metoprolol tartrate  25 mg Oral BID  . pantoprazole  40 mg Oral Daily  . phosphorus  250 mg Oral BID  . sodium chloride flush  3 mL Intravenous Once   Continuous Infusions: . sodium chloride 75 mL/hr at 04/19/20 1000  . dextrose 5 % and 0.45% NaCl 75 mL/hr at 04/19/20 1000  . lactated ringers 20 mL/hr at 04/18/20 0940     LOS: 3 days   Marylu Lund, MD [facility] Pager On Amion  If 7PM-7AM, please contact night-coverage 04/19/2020, 4:43 PM

## 2020-04-20 ENCOUNTER — Inpatient Hospital Stay (HOSPITAL_COMMUNITY): Payer: Medicaid Other | Admitting: Anesthesiology

## 2020-04-20 ENCOUNTER — Encounter (HOSPITAL_COMMUNITY)
Admission: EM | Disposition: A | Discharge status: Home Health | Payer: Self-pay | Source: Home / Self Care | Attending: Internal Medicine

## 2020-04-20 ENCOUNTER — Encounter (HOSPITAL_COMMUNITY): Payer: Self-pay | Admitting: Family Medicine

## 2020-04-20 HISTORY — PX: LAPAROTOMY: SHX154

## 2020-04-20 LAB — CBC WITH DIFFERENTIAL/PLATELET
Abs Immature Granulocytes: 0.01 10*3/uL (ref 0.00–0.07)
Basophils Absolute: 0 10*3/uL (ref 0.0–0.1)
Basophils Relative: 1 %
Eosinophils Absolute: 0.1 10*3/uL (ref 0.0–0.5)
Eosinophils Relative: 2 %
HCT: 33.6 % — ABNORMAL LOW (ref 36.0–46.0)
Hemoglobin: 10.5 g/dL — ABNORMAL LOW (ref 12.0–15.0)
Immature Granulocytes: 0 %
Lymphocytes Relative: 38 %
Lymphs Abs: 1.6 10*3/uL (ref 0.7–4.0)
MCH: 28.1 pg (ref 26.0–34.0)
MCHC: 31.3 g/dL (ref 30.0–36.0)
MCV: 89.8 fL (ref 80.0–100.0)
Monocytes Absolute: 0.5 10*3/uL (ref 0.1–1.0)
Monocytes Relative: 12 %
Neutro Abs: 2 10*3/uL (ref 1.7–7.7)
Neutrophils Relative %: 47 %
Platelets: 241 10*3/uL (ref 150–400)
RBC: 3.74 MIL/uL — ABNORMAL LOW (ref 3.87–5.11)
RDW: 16.2 % — ABNORMAL HIGH (ref 11.5–15.5)
WBC: 4.2 10*3/uL (ref 4.0–10.5)
nRBC: 0 % (ref 0.0–0.2)

## 2020-04-20 LAB — COMPREHENSIVE METABOLIC PANEL
ALT: 8 U/L (ref 0–44)
AST: 13 U/L — ABNORMAL LOW (ref 15–41)
Albumin: 3.2 g/dL — ABNORMAL LOW (ref 3.5–5.0)
Alkaline Phosphatase: 47 U/L (ref 38–126)
Anion gap: 11 (ref 5–15)
BUN: <5 mg/dL — ABNORMAL LOW (ref 8–23)
CO2: 23 mmol/L (ref 22–32)
Calcium: 7.5 mg/dL — ABNORMAL LOW (ref 8.9–10.3)
Chloride: 102 mmol/L (ref 98–111)
Creatinine, Ser: 0.46 mg/dL (ref 0.44–1.00)
GFR calc Af Amer: >60 mL/min (ref >60)
GFR calc non Af Amer: >60 mL/min (ref >60)
Glucose, Bld: 103 mg/dL — ABNORMAL HIGH (ref 70–99)
Potassium: 2.7 mmol/L — CL (ref 3.5–5.1)
Sodium: 136 mmol/L (ref 135–145)
Total Bilirubin: 0.7 mg/dL (ref 0.3–1.2)
Total Protein: 5.9 g/dL — ABNORMAL LOW (ref 6.5–8.1)

## 2020-04-20 LAB — GLUCOSE, CAPILLARY
Glucose-Capillary: 98 mg/dL (ref 70–99)
Glucose-Capillary: 104 mg/dL — ABNORMAL HIGH (ref 70–99)
Glucose-Capillary: 132 mg/dL — ABNORMAL HIGH (ref 70–99)
Glucose-Capillary: 208 mg/dL — ABNORMAL HIGH (ref 70–99)
Glucose-Capillary: 125 mg/dL — ABNORMAL HIGH (ref 70–99)
Glucose-Capillary: 136 mg/dL — ABNORMAL HIGH (ref 70–99)
Glucose-Capillary: 139 mg/dL — ABNORMAL HIGH (ref 70–99)

## 2020-04-20 LAB — POCT I-STAT, CHEM 8
BUN: <3 mg/dL — ABNORMAL LOW (ref 8–23)
Calcium, Ion: 1.05 mmol/L — ABNORMAL LOW (ref 1.15–1.40)
Chloride: 102 mmol/L (ref 98–111)
Creatinine, Ser: 0.4 mg/dL — ABNORMAL LOW (ref 0.44–1.00)
Glucose, Bld: 115 mg/dL — ABNORMAL HIGH (ref 70–99)
HCT: 37 % (ref 36.0–46.0)
Hemoglobin: 12.6 g/dL (ref 12.0–15.0)
Potassium: 4.2 mmol/L (ref 3.5–5.1)
Sodium: 140 mmol/L (ref 135–145)
TCO2: 27 mmol/L (ref 22–32)

## 2020-04-20 LAB — SURGICAL PATHOLOGY

## 2020-04-20 LAB — POTASSIUM: Potassium: 3.4 mmol/L — ABNORMAL LOW (ref 3.5–5.1)

## 2020-04-20 LAB — MAGNESIUM: Magnesium: 1.9 mg/dL (ref 1.7–2.4)

## 2020-04-20 LAB — PHOSPHORUS: Phosphorus: 1.2 mg/dL — ABNORMAL LOW (ref 2.5–4.6)

## 2020-04-20 SURGERY — LAPAROTOMY, EXPLORATORY
Anesthesia: General | Site: Abdomen

## 2020-04-20 MED ORDER — FENTANYL CITRATE (PF) 250 MCG/5ML IJ SOLN
INTRAMUSCULAR | Status: DC | PRN
  Administered 2020-04-20: 125 ug via INTRAVENOUS
  Administered 2020-04-20: 50 ug via INTRAVENOUS
  Administered 2020-04-20: 125 ug via INTRAVENOUS
  Administered 2020-04-20: 50 ug via INTRAVENOUS

## 2020-04-20 MED ORDER — LIDOCAINE 5 % EX PTCH
1.0000 | MEDICATED_PATCH | CUTANEOUS | Status: DC
  Administered 2020-04-20 – 2020-05-01 (×12): 1 via TRANSDERMAL
  Filled 2020-04-20 (×12): qty 1

## 2020-04-20 MED ORDER — SUCCINYLCHOLINE CHLORIDE 200 MG/10ML IV SOSY
PREFILLED_SYRINGE | INTRAVENOUS | Status: DC | PRN
  Administered 2020-04-20: 60 mg via INTRAVENOUS

## 2020-04-20 MED ORDER — SODIUM CHLORIDE 0.9 % IV SOLN
2.0000 g | Freq: Three times a day (TID) | INTRAVENOUS | Status: AC
  Administered 2020-04-20: 2 g via INTRAVENOUS
  Filled 2020-04-20: qty 2

## 2020-04-20 MED ORDER — MIDAZOLAM HCL 2 MG/2ML IJ SOLN
INTRAMUSCULAR | Status: DC | PRN
  Administered 2020-04-20: 2 mg via INTRAVENOUS

## 2020-04-20 MED ORDER — FENTANYL 50 MCG/ML IV PCA SOLN: INTRAVENOUS | Status: DC

## 2020-04-20 MED ORDER — ACETAMINOPHEN 10 MG/ML IV SOLN
1000.0000 mg | Freq: Four times a day (QID) | INTRAVENOUS | Status: AC
  Administered 2020-04-20 – 2020-04-21 (×4): 1000 mg via INTRAVENOUS
  Filled 2020-04-20 (×4): qty 100

## 2020-04-20 MED ORDER — ONDANSETRON HCL 4 MG/2ML IJ SOLN
INTRAMUSCULAR | Status: DC | PRN
  Administered 2020-04-20: 4 mg via INTRAVENOUS

## 2020-04-20 MED ORDER — BUPIVACAINE LIPOSOME 1.3 % IJ SUSP
20.0000 mL | Freq: Once | INTRAMUSCULAR | Status: AC
  Administered 2020-04-20: 20 mL
  Filled 2020-04-20: qty 20

## 2020-04-20 MED ORDER — ONDANSETRON HCL 4 MG/2ML IJ SOLN: 4.0000 mg | Freq: Four times a day (QID) | INTRAMUSCULAR | Status: DC | PRN

## 2020-04-20 MED ORDER — FENTANYL CITRATE (PF) 100 MCG/2ML IJ SOLN: 25.0000 ug | INTRAMUSCULAR | Status: DC | PRN

## 2020-04-20 MED ORDER — BUPIVACAINE HCL (PF) 0.25 % IJ SOLN
INTRAMUSCULAR | Status: AC
  Filled 2020-04-20: qty 30

## 2020-04-20 MED ORDER — DIPHENHYDRAMINE HCL 50 MG/ML IJ SOLN: 12.5000 mg | Freq: Four times a day (QID) | INTRAMUSCULAR | Status: DC | PRN

## 2020-04-20 MED ORDER — DIPHENHYDRAMINE HCL 12.5 MG/5ML PO ELIX: 12.5000 mg | ORAL_SOLUTION | Freq: Four times a day (QID) | ORAL | Status: DC | PRN

## 2020-04-20 MED ORDER — 0.9 % SODIUM CHLORIDE (POUR BTL) OPTIME
TOPICAL | Status: DC | PRN
  Administered 2020-04-20 (×3): 1000 mL

## 2020-04-20 MED ORDER — ROCURONIUM BROMIDE 10 MG/ML (PF) SYRINGE
PREFILLED_SYRINGE | INTRAVENOUS | Status: DC | PRN
  Administered 2020-04-20: 40 mg via INTRAVENOUS

## 2020-04-20 MED ORDER — DEXAMETHASONE SODIUM PHOSPHATE 10 MG/ML IJ SOLN
INTRAMUSCULAR | Status: DC | PRN
  Administered 2020-04-20: 8 mg via INTRAVENOUS

## 2020-04-20 MED ORDER — SUGAMMADEX SODIUM 200 MG/2ML IV SOLN
INTRAVENOUS | Status: DC | PRN
  Administered 2020-04-20: 150 mg via INTRAVENOUS

## 2020-04-20 MED ORDER — POTASSIUM CHLORIDE 10 MEQ/100ML IV SOLN: 10.0000 meq | INTRAVENOUS | Status: DC

## 2020-04-20 MED ORDER — SODIUM CHLORIDE 0.9% FLUSH: 9.0000 mL | INTRAVENOUS | Status: DC | PRN

## 2020-04-20 MED ORDER — LACTATED RINGERS IV SOLN: INTRAVENOUS | Status: DC

## 2020-04-20 MED ORDER — PROPOFOL 10 MG/ML IV BOLUS
INTRAVENOUS | Status: DC | PRN
  Administered 2020-04-20: 150 mg via INTRAVENOUS

## 2020-04-20 MED ORDER — FENTANYL CITRATE (PF) 250 MCG/5ML IJ SOLN
INTRAMUSCULAR | Status: AC
  Filled 2020-04-20: qty 5

## 2020-04-20 MED ORDER — MIDAZOLAM HCL 2 MG/2ML IJ SOLN
INTRAMUSCULAR | Status: AC
  Filled 2020-04-20: qty 2

## 2020-04-20 MED ORDER — NALOXONE HCL 0.4 MG/ML IJ SOLN: 0.4000 mg | INTRAMUSCULAR | Status: DC | PRN

## 2020-04-20 MED ORDER — OXYCODONE HCL 5 MG PO TABS
5.0000 mg | ORAL_TABLET | ORAL | Status: DC | PRN
  Administered 2020-04-21 – 2020-04-22 (×2): 10 mg via ORAL
  Administered 2020-04-23: 5 mg via ORAL
  Administered 2020-04-24 – 2020-04-28 (×5): 10 mg via ORAL
  Filled 2020-04-20: qty 2
  Filled 2020-04-20: qty 1
  Filled 2020-04-20 (×7): qty 2

## 2020-04-20 MED ORDER — LIDOCAINE 2% (20 MG/ML) 5 ML SYRINGE
INTRAMUSCULAR | Status: DC | PRN
  Administered 2020-04-20: 60 mg via INTRAVENOUS

## 2020-04-20 MED ORDER — MORPHINE SULFATE (PF) 2 MG/ML IV SOLN
2.0000 mg | INTRAVENOUS | Status: DC | PRN
  Administered 2020-04-20 – 2020-04-23 (×9): 2 mg via INTRAVENOUS
  Filled 2020-04-20 (×9): qty 1

## 2020-04-20 MED ORDER — CHLORHEXIDINE GLUCONATE 0.12 % MT SOLN: 15.0000 mL | Freq: Once | OROMUCOSAL | Status: AC

## 2020-04-20 MED ORDER — ORAL CARE MOUTH RINSE: 15.0000 mL | Freq: Once | OROMUCOSAL | Status: AC

## 2020-04-20 MED ORDER — BUPIVACAINE HCL (PF) 0.25 % IJ SOLN
INTRAMUSCULAR | Status: DC | PRN
  Administered 2020-04-20: 30 mL

## 2020-04-20 MED ORDER — METHOCARBAMOL 1000 MG/10ML IJ SOLN
500.0000 mg | Freq: Four times a day (QID) | INTRAVENOUS | Status: DC | PRN
  Filled 2020-04-20: qty 5

## 2020-04-20 MED ORDER — POTASSIUM CHLORIDE 10 MEQ/100ML IV SOLN
10.0000 meq | INTRAVENOUS | Status: DC
  Administered 2020-04-20 (×3): 10 meq via INTRAVENOUS
  Filled 2020-04-20 (×2): qty 100

## 2020-04-20 MED ORDER — CHLORHEXIDINE GLUCONATE CLOTH 2 % EX PADS
6.0000 | MEDICATED_PAD | Freq: Every day | CUTANEOUS | Status: DC
  Administered 2020-04-21 – 2020-04-27 (×6): 6 via TOPICAL

## 2020-04-20 MED ORDER — METHOCARBAMOL 1000 MG/10ML IJ SOLN
500.0000 mg | Freq: Three times a day (TID) | INTRAVENOUS | Status: DC
  Administered 2020-04-21 – 2020-05-02 (×34): 500 mg via INTRAVENOUS
  Filled 2020-04-20 (×6): qty 5
  Filled 2020-04-20: qty 500
  Filled 2020-04-20 (×8): qty 5
  Filled 2020-04-20 (×2): qty 500
  Filled 2020-04-20 (×6): qty 5
  Filled 2020-04-20: qty 500
  Filled 2020-04-20 (×16): qty 5

## 2020-04-20 MED ORDER — PROPOFOL 10 MG/ML IV BOLUS
INTRAVENOUS | Status: AC
  Filled 2020-04-20: qty 20

## 2020-04-20 MED ORDER — CHLORHEXIDINE GLUCONATE 0.12 % MT SOLN
OROMUCOSAL | Status: AC
  Administered 2020-04-20: 15 mL via OROMUCOSAL
  Filled 2020-04-20: qty 15

## 2020-04-20 SURGICAL SUPPLY — 43 items
BLADE CLIPPER SURG (BLADE) IMPLANT
CANISTER SUCT 3000ML PPV (MISCELLANEOUS) ×2 IMPLANT
COVER SURGICAL LIGHT HANDLE (MISCELLANEOUS) ×2 IMPLANT
COVER WAND RF STERILE (DRAPES) IMPLANT
DRAPE LAPAROSCOPIC ABDOMINAL (DRAPES) ×2 IMPLANT
DRAPE WARM FLUID 44X44 (DRAPES) ×2 IMPLANT
DRSG OPSITE POSTOP 4X10 (GAUZE/BANDAGES/DRESSINGS) ×2 IMPLANT
DRSG OPSITE POSTOP 4X8 (GAUZE/BANDAGES/DRESSINGS) IMPLANT
ELECT BLADE 6.5 EXT (BLADE) IMPLANT
ELECT CAUTERY BLADE 6.4 (BLADE) ×2 IMPLANT
ELECT REM PT RETURN 9FT ADLT (ELECTROSURGICAL) ×2
ELECTRODE REM PT RTRN 9FT ADLT (ELECTROSURGICAL) ×1 IMPLANT
GLOVE SURG SIGNA 7.5 PF LTX (GLOVE) ×2 IMPLANT
GOWN STRL REUS W/ TWL LRG LVL3 (GOWN DISPOSABLE) ×1 IMPLANT
GOWN STRL REUS W/ TWL XL LVL3 (GOWN DISPOSABLE) ×1 IMPLANT
GOWN STRL REUS W/TWL LRG LVL3 (GOWN DISPOSABLE) ×1
GOWN STRL REUS W/TWL XL LVL3 (GOWN DISPOSABLE) ×1
HANDLE SUCTION POOLE (INSTRUMENTS) ×1 IMPLANT
KIT BASIN OR (CUSTOM PROCEDURE TRAY) ×2 IMPLANT
KIT OSTOMY DRAINABLE 2.75 STR (WOUND CARE) ×2 IMPLANT
KIT TURNOVER KIT B (KITS) ×2 IMPLANT
LIGASURE IMPACT 36 18CM CVD LR (INSTRUMENTS) ×2 IMPLANT
NS IRRIG 1000ML POUR BTL (IV SOLUTION) ×4 IMPLANT
PACK GENERAL/GYN (CUSTOM PROCEDURE TRAY) ×2 IMPLANT
PAD ARMBOARD 7.5X6 YLW CONV (MISCELLANEOUS) ×2 IMPLANT
PENCIL SMOKE EVACUATOR (MISCELLANEOUS) ×2 IMPLANT
SPECIMEN JAR LARGE (MISCELLANEOUS) IMPLANT
SPONGE LAP 18X18 RF (DISPOSABLE) ×4 IMPLANT
STAPLER CUT CVD 40MM BLUE (STAPLE) ×2 IMPLANT
STAPLER PROXIMATE 75MM BLUE (STAPLE) ×2 IMPLANT
STAPLER VISISTAT 35W (STAPLE) ×2 IMPLANT
SUCTION POOLE HANDLE (INSTRUMENTS) ×2
SUT PDS AB 1 TP1 96 (SUTURE) ×6 IMPLANT
SUT PROLENE 2 0 SH 30 (SUTURE) ×2 IMPLANT
SUT SILK 2 0 SH CR/8 (SUTURE) ×2 IMPLANT
SUT SILK 2 0 TIES 10X30 (SUTURE) ×2 IMPLANT
SUT SILK 3 0 SH CR/8 (SUTURE) ×2 IMPLANT
SUT SILK 3 0 TIES 10X30 (SUTURE) ×2 IMPLANT
SUT VIC AB 3-0 SH 18 (SUTURE) ×2 IMPLANT
TOWEL GREEN STERILE (TOWEL DISPOSABLE) ×2 IMPLANT
TOWEL GREEN STERILE FF (TOWEL DISPOSABLE) ×2 IMPLANT
TRAY FOLEY MTR SLVR 16FR STAT (SET/KITS/TRAYS/PACK) ×2 IMPLANT
YANKAUER SUCT BULB TIP NO VENT (SUCTIONS) ×2 IMPLANT

## 2020-04-20 NOTE — Op Note (Signed)
SIGMOID COLECTOMY AND COLOSTOMY  Procedure Note  Bailey Walker 04/15/2020 - 04/20/2020   Pre-op Diagnosis: colon obstruction     Post-op Diagnosis: same  Procedure(s): SIGMOID COLECTOMY AND END COLOSTOMY  Surgeon(s): Coralie Keens, MD  Anesthesia: General  Staff:  Circulator: Rometta Emery, RN Physician Assistant: Jill Alexanders, PA-C Scrub Person: Rolan Bucco Circulator Assistant: Gleason, Chloe, RN  Estimated Blood Loss: less than 100 mL               Specimens: sent to path  Indications: This is a 74 year old female who presents with an obstructing mass in her distal sigmoid colon.  She had undergone endoscopy which showed that the colon was completely obstructed.  She was tattooed just distal to the mass.  Her CEA level was elevated preoperatively.  There was no gross metastatic disease in the rest of the abdominal cavity including liver.  The tumor was growing through the sidewall of the colon and into the retroperitoneum.  Procedure: The patient was brought to operating identifies correct patient.  She is placed upon the operating table general anesthesia was induced.  Her abdomen was then prepped and draped in usual sterile fashion.  I created midline incision with a scalpel below the umbilicus.  I carried this down through the subtenons tissue to the fascia with the cautery.  I then opened the fascia and the peritoneum the entire length of the incision.  Upon entering the abdomen there was mild amount of ascites.  I could easily palpate the large mass in the sigmoid colon.  I extended my incision above the umbilicus.  I evaluated the right colon hepatic flexure and found no evidence of tumor or obstruction there.  The liver was normal to palpation.  I said no gross metastatic implants.  The mass itself was in the sigmoid colon and fixed to the lateral peritoneum.  It felt like the mass was coming through the bowel wall.  There was no evidence of  perforation.  I mobilized the descending colon along the white line of [place] with the cautery.  I was able to stay around the mass and dissected out the retroperitoneum.  I visualized the left ureter which appeared to be left intact.  I was able to finally get the entire mass and colon away from the retroperitoneum and elevated.  I then transected the proximal rectum with the contour stapler.  I then took down the mesentery with the LigaSure.  I then identified descending colon proximal to the mass and transected it with a GIA-75 stapler.  I then took down the rest of the mesentery with the LigaSure device.  The mass was then sent to pathology for evaluation.  I achieved hemostasis in the retroperitoneum with the cautery.  I irrigated the abdomen with normal saline.  Again hemostasis appeared to be achieved.  I placed a 2-0 Prolene suture at the center of the suture line of the rectal stump.  We then mobilized the descending colon further.  I made a separate elliptical incision the patient's left mid abdomen with the cautery.  I carried this down to the fascia which was then opened in a cruciate fashion.  The underlying muscle fibers were retracted and the peritoneum was opened.  We then pulled the descending colon out of this incision as and end colostomy.  We then irrigated the abdomen further with normal saline.  Hemostasis appeared to be achieved.  The patient's midline fascia was then closed with running #1  looped PDS suture.  I anesthetized the fascia circumferentially with Exparel.  Irrigated the incision with saline.  We then closed the incision with staples.  I then remove the staple line at the ostomy site with the cautery.  I then matured the ostomy circumferentially with 3-0 Vicryl sutures.  The ostomy appeared perfused.  An occlusive bandage was placed over the midline incision and an ostomy appliance was applied.  The patient tolerated the procedure well.  All the counts were correct at the end of the  procedure.  The patient was then extubated in the operating room and taken in a stable condition to the recovery room.          Coralie Keens   Date: 04/20/2020  Time: 10:18 AM

## 2020-04-20 NOTE — Progress Notes (Signed)
Patient was ordered Fentanyl PCA for post op pain management. No PCA pumps on the floor and materials states they do not have any available. Asked provider to change to different medication and orders received. Will increase rounding on patient to make sure she does not need pain medication due to language barrier.

## 2020-04-20 NOTE — Transfer of Care (Signed)
Immediate Anesthesia Transfer of Care Note  Patient: Bailey Walker  Procedure(s) Performed: SIGMOID COLECTOMY AND COLOSTOMY (N/A Abdomen)  Patient Location: PACU  Anesthesia Type:General  Level of Consciousness: patient cooperative and responds to stimulation  Airway & Oxygen Therapy: Patient Spontanous Breathing and Patient connected to nasal cannula oxygen  Post-op Assessment: Report given to RN and Post -op Vital signs reviewed and stable  Post vital signs: Reviewed and stable  Last Vitals:  Vitals Value Taken Time  BP 179/74 04/20/20 1040  Temp 36.9 C 04/20/20 1040  Pulse 78 04/20/20 1042  Resp 9 04/20/20 1042  SpO2 91 % 04/20/20 1042  Vitals shown include unvalidated device data.  Last Pain:  Vitals:   04/20/20 1040  TempSrc:   PainSc: Asleep         Complications: No complications documented.

## 2020-04-20 NOTE — Anesthesia Postprocedure Evaluation (Signed)
Anesthesia Post Note  Patient: Bailey Walker  Procedure(s) Performed: FLEXIBLE SIGMOIDOSCOPY (N/A ) BIOPSY SUBMUCOSAL TATTOO INJECTION     Patient location during evaluation: PACU Anesthesia Type: MAC Level of consciousness: awake and alert and oriented Pain management: pain level controlled Vital Signs Assessment: post-procedure vital signs reviewed and stable Respiratory status: spontaneous breathing, nonlabored ventilation and respiratory function stable Cardiovascular status: blood pressure returned to baseline Postop Assessment: no apparent nausea or vomiting Anesthetic complications: no   No complications documented.             [name]

## 2020-04-20 NOTE — Progress Notes (Signed)
PROGRESS NOTE    Bailey Walker  UDJ:[id number] DOB: 06/10/1946 DOA: 04/15/2020 PCP: [name], MD    Brief Narrative:  74 y.o. female with medical history significant of hypertension, type 2 diabetes mellitus, hypothyroidism, obesity presents to emergency department with abdominal pain and vomiting since 2 days.  Patient speaks Arabic.  History gathered from patient's daughter at the bedside.  She mentioned that patient has severe generalized abdominal pain and vomiting since July 4 however it got worse since 2 days.  She could not keep anything down, she has 10 out of 10, nonradiating, sharp pain, no aggravating or relieving factors, associated with vomiting 4 times since yesterday.  Vomitus is nonbloody.  She has lost about 7 to 10 pounds since July for the due to abdominal pain and vomiting.  No history of colon cancer in the family.  No night sweats, melena, fever, chills, headache, blurry vision, chest pain, shortness of breath, palpitation, leg swelling.  Reports dysuria since few days.  No foul-smelling or change in urinary frequency, lower back pain.  She had regular bowel movement this morning.  She is a former smoker.  No history of alcohol, illicit drug use.  ED Course: Upon arrival to ED: Patient's vital signs stable.  She received IV fluid bolus, Protonix, Zofran, Dilaudid as needed for pain control and Rocephin for possible UTI in ED.  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  Patient transferred to [facility] for further evaluation and management.  Assessment & Plan:   Principal Problem:   Colon obstruction ([place]) Active Problems:   Hypothyroidism   Essential hypertension   Diabetes mellitus due to underlying condition with unspecified complications ([place])   UTI (urinary tract infection)   Abdominal distension   Colonic mass   Nausea and vomiting in adult   Abnormal CT scan, gastrointestinal tract    Gastritis and gastroduodenitis   Grade II internal hemorrhoids  Abdominal pain with vomiting secondary to obstructing colonic mass: -Concern for possible underlying obstructive colorectal carcinoma. -Patient's vital signs remain stable.  She is afebrile with no leukocytosis.  COVID-19 negative.  Lipase: WNL. -continue IVF hydration for now -Appreciate assistance by GI.  Gastritis noted on EGD. Flex sig now confirming obstructing colonic mass -Appreciate General Surgery assistance.  Patient is now status post sigmoid colectomy with end colostomy -Await path results -Of note, CEA is elevated at 14.1 -Likely order CT for staging purposes within the next 24 to 48 hours  UTI: Patient reports dysuria.  UA is positive for leukocyte/bacteria/WBC -Urine culture is pending.  She received Rocephin in ED.  Continued on rocephin.  Follow pt's urine culture.  Hypertension:  -metoprolol initially held given soft BP at time of admit -BP now improved on beta blocker   Hypothyroidism: Check TSH in AM -Continue levothyroxine, have transitioned to IV formulation given recent surgery  Type 2 diabetes mellitus: Hold glipizide and Metformin.  Started patient on sliding scale insulin and monitor blood sugar closely  Iron deficiency anemia: H&H is stable at this time -Continue ferrous sulfate -Followed by hematology as outpatient Dr. Alvy Bimler -Repeat CBC in the morning  DVT prophylaxis: Lovenox subq Code Status: Full Family Communication: Pt in room, family at bedside  Status is: Inpatient  Remains inpatient appropriate because:Ongoing diagnostic testing needed not appropriate for outpatient work up   Dispo: The patient is from: Home              Anticipated d/c is to: Home  Anticipated d/c date is: 2 days              Patient currently is not medically stable to d/c.       Consultants:   GI  General Surgery  Procedures:   EGD, flex sig 7/28  Sigmoid colectomy with  end colostomy 04/20/2020  Antimicrobials: Anti-infectives (From admission, onward)   Start     Dose/Rate Route Frequency Ordered Stop   04/20/20 1200  cefOXitin (MEFOXIN) 2 g in sodium chloride 0.9 % 100 mL IVPB        2 g 200 mL/hr over 30 Minutes Intravenous Every 8 hours 04/20/20 1130 04/20/20 1245   04/19/20 1800  cefOXitin (MEFOXIN) 2 g in sodium chloride 0.9 % 100 mL IVPB  Status:  Discontinued        2 g 200 mL/hr over 30 Minutes Intravenous Every 8 hours 04/19/20 1659 04/20/20 1130   04/17/20 0000  cefTRIAXone (ROCEPHIN) 1 g in sodium chloride 0.9 % 100 mL IVPB        1 g 200 mL/hr over 30 Minutes Intravenous  Once 04/16/20 1604 04/16/20 2347   04/16/20 0115  cefTRIAXone (ROCEPHIN) 1 g in sodium chloride 0.9 % 100 mL IVPB        1 g 200 mL/hr over 30 Minutes Intravenous  Once 04/16/20 0113 04/16/20 0435      Subjective: Patient seen postoperatively, still somewhat sedated  Objective: Vitals:   04/20/20 1055 04/20/20 1100 04/20/20 1104 04/20/20 1117  BP: (!) 157/67  (!) 150/72 (!) 174/79  Pulse: 72 73 72 81  Resp: 16 12 12 16   Temp:   98.4 F (36.9 C) 97.8 F (36.6 C)  TempSrc:    Axillary  SpO2: 93% 92% 94% 96%  Weight:      Height:        Intake/Output Summary (Last 24 hours) at 04/20/2020 1537 Last data filed at 04/20/2020 1138 Gross per 24 hour  Intake 2784.46 ml  Output 815 ml  Net 1969.46 ml   Filed Weights   04/15/20 1956 04/16/20 1805 04/19/20 0840  Weight: 72.9 kg 72.6 kg 72.6 kg    Examination: General exam: asleep, in no acute distress Respiratory system: normal chest rise, clear, no audible wheezing Cardiovascular system: regular rhythm, s1-s2 Gastrointestinal system: Nondistended, nontender, pos BS Central nervous system: No seizures, no tremors Extremities: No cyanosis, no joint deformities Skin: No rashes, no pallor Psychiatry: Unable to assess as patient remains sedated following this morning's surgical procedure    Data Reviewed: I  have personally reviewed following labs and imaging studies  CBC: Recent Labs  Lab 04/16/20 1559 04/16/20 1559 04/17/20 0209 04/18/20 0447 04/19/20 0134 04/20/20 0057 04/20/20 0837  WBC 5.9  --  5.6 4.0 3.5* 4.2  --   NEUTROABS  --   --   --  1.5* 1.3* 2.0  --   HGB 12.2   < > 11.6* 12.2 10.3* 10.5* 12.6  HCT 38.3   < > 37.8 38.9 32.8* 33.6* 37.0  MCV 89.3  --  90.6 90.3 91.9 89.8  --   PLT 310  --  276 320 258 241  --    < > = values in this interval not displayed.   Basic Metabolic Panel: Recent Labs  Lab 04/16/20 0042 04/16/20 0042 04/16/20 1559 04/17/20 6294 04/17/20 0209 04/18/20 0447 04/19/20 0134 04/20/20 0057 04/20/20 0837 04/20/20 1158  NA 137   < >  --  137  --  138 137  136 140  --   K 3.6   < >  --  3.4*   < > 3.7 2.6* 2.7* 4.2 3.4*  CL 102   < >  --  106  --  107 105 102 102  --   CO2 21*  --   --  21*  --  21* 22 23  --   --   GLUCOSE 142*   < >  --  122*  --  111* 164* 103* 115*  --   BUN 10   < >  --  8  --  15 12 <5* <3*  --   CREATININE 0.48   < > 0.58 0.59  --  0.62 0.57 0.46 0.40*  --   CALCIUM 7.7*  --   --  7.0*  --  7.5* 7.2* 7.5*  --   --   MG  --   --  1.9  --   --  2.2 2.0 1.9  --   --   PHOS  --   --   --   --   --  2.5 1.5* 1.2*  --   --    < > = values in this interval not displayed.   GFR: Estimated Creatinine Clearance: 54.5 mL/min (A) (by C-G formula based on SCr of 0.4 mg/dL (L)). Liver Function Tests: Recent Labs  Lab 04/16/20 0042 04/17/20 0209 04/18/20 0447 04/19/20 0134 04/20/20 0057  AST 16 10* 11* 11* 13*  ALT 11 9 8 10 8   ALKPHOS 58 47 50 40 47  BILITOT 0.5 0.7 0.7 0.7 0.7  PROT 7.9 6.3* 6.2* 5.8* 5.9*  ALBUMIN 4.3 3.3* 3.2* 3.2* 3.2*   Recent Labs  Lab 04/16/20 0042  LIPASE 19   No results for input(s): AMMONIA in the last 168 hours. Coagulation Profile: No results for input(s): INR, PROTIME in the last 168 hours. Cardiac Enzymes: No results for input(s): CKTOTAL, CKMB, CKMBINDEX, TROPONINI in the last 168  hours. BNP (last 3 results) No results for input(s): PROBNP in the last 8760 hours. HbA1C: No results for input(s): HGBA1C in the last 72 hours. CBG: Recent Labs  Lab 04/19/20 2318 04/20/20 0324 04/20/20 0732 04/20/20 1036 04/20/20 1154  GLUCAP 98 98 104* 132* 208*   Lipid Profile: No results for input(s): CHOL, HDL, LDLCALC, TRIG, CHOLHDL, LDLDIRECT in the last 72 hours. Thyroid Function Tests: No results for input(s): TSH, T4TOTAL, FREET4, T3FREE, THYROIDAB in the last 72 hours. Anemia Panel: No results for input(s): VITAMINB12, FOLATE, FERRITIN, TIBC, IRON, RETICCTPCT in the last 72 hours. Sepsis Labs: No results for input(s): PROCALCITON, LATICACIDVEN in the last 168 hours.  Recent Results (from the past 240 hour(s))  Urine Culture     Status: Abnormal   Collection Time: 04/16/20  1:13 AM   Specimen: Urine, Random  Result Value Ref Range Status   Specimen Description   Final    URINE, RANDOM Performed at [facility], [address] [place], [place] [address]    Special Requests   Final    NONE Performed at [facility], [address] [place], [place] [address]    Culture MULTIPLE SPECIES PRESENT, SUGGEST RECOLLECTION (A)  Final   Report Status 04/17/2020 FINAL  Final  SARS Coronavirus 2 by RT PCR (hospital order, performed in [facility] hospital lab) Nasopharyngeal Nasopharyngeal Swab     Status: None   Collection Time: 04/16/20  4:27 AM   Specimen: Nasopharyngeal Swab  Result Value Ref Range Status   SARS Coronavirus 2 NEGATIVE NEGATIVE Final    Comment: (NOTE) SARS-CoV-2 target nucleic acids are NOT DETECTED.  The SARS-CoV-2 RNA is generally detectable in upper and lower respiratory specimens during the acute phase of infection. The lowest concentration of SARS-CoV-2 viral copies this assay can detect is 250 copies / mL. A negative result does not preclude SARS-CoV-2 infection and should not be used as the sole basis for treatment or  other patient management decisions.  A negative result may occur with improper specimen collection / handling, submission of specimen other than nasopharyngeal swab, presence of viral mutation(s) within the areas targeted by this assay, and inadequate number of viral copies (<250 copies / mL). A negative result must be combined with clinical observations, patient history, and epidemiological information.  Fact Sheet for Patients:   StrictlyIdeas.no  Fact Sheet for Healthcare Providers: BankingDealers.co.za  This test is not yet approved or  cleared by the [place] FDA and has been authorized for detection and/or diagnosis of SARS-CoV-2 by FDA under an Emergency Use Authorization (EUA).  This EUA will remain in effect (meaning this test can be used) for the duration of the COVID-19 declaration under Section 564(b)(1) of the Act, 21 U.S.C. section 360bbb-3(b)(1), unless the authorization is terminated or revoked sooner.  Performed at [facility], [address], [place], [place] [address]      Radiology Studies: No results found.  Scheduled Meds: . aspirin EC  81 mg Oral Daily  . [START ON 04/21/2020] Chlorhexidine Gluconate Cloth  6 each Topical Daily  . enoxaparin (LOVENOX) injection  40 mg Subcutaneous Q24H  . insulin aspart  0-20 Units Subcutaneous Q4H  . levothyroxine  37.5 mcg Intravenous Daily  . lidocaine  1 patch Transdermal Q24H  . metoprolol tartrate  5 mg Intravenous Q6H  . pantoprazole (PROTONIX) IV  40 mg Intravenous Q24H   Continuous Infusions: . acetaminophen 1,000 mg (04/20/20 1218)  . dextrose 5 % and 0.45% NaCl 75 mL/hr at 04/19/20 2324  . lactated ringers 20 mL/hr at 04/18/20 0940  . methocarbamol (ROBAXIN) IV       LOS: 4 days   Marylu Lund, MD [facility] Pager On Amion  If 7PM-7AM, please contact night-coverage 04/20/2020, 3:37 PM

## 2020-04-20 NOTE — Anesthesia Procedure Notes (Addendum)
Procedure Name: Intubation Date/Time: 04/20/2020 8:55 AM Performed by: Verdie Drown, CRNA Pre-anesthesia Checklist: Patient identified, Emergency Drugs available, Suction available and Patient being monitored Patient Re-evaluated:Patient Re-evaluated prior to induction Oxygen Delivery Method: Circle System Utilized Preoxygenation: Pre-oxygenation with 100% oxygen Induction Type: IV induction, Rapid sequence and Cricoid Pressure applied Laryngoscope Size: Mac and 3 Tube type: Oral Tube size: 7.0 mm Number of attempts: 1 Airway Equipment and Method: Stylet and Oral airway Placement Confirmation: ETT inserted through vocal cords under direct vision,  positive ETCO2 and breath sounds checked- equal and bilateral Secured at: 21 cm Tube secured with: Tape Dental Injury: Teeth and Oropharynx as per pre-operative assessment

## 2020-04-20 NOTE — Progress Notes (Signed)
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  Biopsies from upper endoscopy with non-H. pylori gastritis and benign hyperplastic polyp.  Patient underwent sigmoid colectomy and end colostomy today.

## 2020-04-20 NOTE — Anesthesia Postprocedure Evaluation (Signed)
Anesthesia Post Note  Patient: Bailey Walker  Procedure(s) Performed: SIGMOID COLECTOMY AND COLOSTOMY (N/A Abdomen)     Patient location during evaluation: PACU Anesthesia Type: General Level of consciousness: awake Pain management: pain level controlled Vital Signs Assessment: post-procedure vital signs reviewed and stable Respiratory status: spontaneous breathing Cardiovascular status: stable Postop Assessment: no apparent nausea or vomiting Anesthetic complications: no   No complications documented.  Last Vitals:  Vitals:   04/20/20 1104 04/20/20 1117  BP: (!) 150/72 (!) 174/79  Pulse: 72 81  Resp: 12 16  Temp: 36.9 C 36.6 C  SpO2: 94% 96%    Last Pain:  Vitals:   04/20/20 1449  TempSrc:   PainSc: 10-Worst pain ever                 Dorrene Bently

## 2020-04-20 NOTE — Anesthesia Preprocedure Evaluation (Addendum)
Anesthesia Evaluation  Patient identified by MRN, date of birth, ID band Patient awake    Reviewed: Allergy & Precautions, NPO status , Patient's Chart, lab work & pertinent test results  Airway Mallampati: II  TM Distance: >3 FB     Dental   Pulmonary former smoker,    breath sounds clear to auscultation       Cardiovascular hypertension,  Rhythm:Regular Rate:Normal     Neuro/Psych    GI/Hepatic negative GI ROS, Neg liver ROS,   Endo/Other  diabetesHypothyroidism   Renal/GU      Musculoskeletal   Abdominal   Peds  Hematology  (+) anemia ,   Anesthesia Other Findings   Reproductive/Obstetrics                             Anesthesia Physical Anesthesia Plan  ASA: III  Anesthesia Plan: General   Post-op Pain Management:    Induction: Intravenous  PONV Risk Score and Plan: 3 and Ondansetron, Dexamethasone and Midazolam  Airway Management Planned: Oral ETT  Additional Equipment:   Intra-op Plan:   Post-operative Plan:   Informed Consent: I have reviewed the patients History and Physical, chart, labs and discussed the procedure including the risks, benefits and alternatives for the proposed anesthesia with the patient or authorized representative who has indicated his/her understanding and acceptance.     Dental advisory given  Plan Discussed with: CRNA and Anesthesiologist  Anesthesia Plan Comments:         Anesthesia Quick Evaluation

## 2020-04-21 ENCOUNTER — Inpatient Hospital Stay (HOSPITAL_COMMUNITY): Payer: Medicaid Other

## 2020-04-21 ENCOUNTER — Encounter (HOSPITAL_COMMUNITY): Payer: Self-pay | Admitting: Surgery

## 2020-04-21 LAB — GLUCOSE, CAPILLARY
Glucose-Capillary: 146 mg/dL — ABNORMAL HIGH (ref 70–99)
Glucose-Capillary: 193 mg/dL — ABNORMAL HIGH (ref 70–99)
Glucose-Capillary: 135 mg/dL — ABNORMAL HIGH (ref 70–99)
Glucose-Capillary: 141 mg/dL — ABNORMAL HIGH (ref 70–99)
Glucose-Capillary: 114 mg/dL — ABNORMAL HIGH (ref 70–99)

## 2020-04-21 LAB — COMPREHENSIVE METABOLIC PANEL
ALT: 14 U/L (ref 0–44)
AST: 19 U/L (ref 15–41)
Albumin: 2.9 g/dL — ABNORMAL LOW (ref 3.5–5.0)
Alkaline Phosphatase: 41 U/L (ref 38–126)
Anion gap: 10 (ref 5–15)
BUN: 6 mg/dL — ABNORMAL LOW (ref 8–23)
CO2: 23 mmol/L (ref 22–32)
Calcium: 7.2 mg/dL — ABNORMAL LOW (ref 8.9–10.3)
Chloride: 102 mmol/L (ref 98–111)
Creatinine, Ser: 0.52 mg/dL (ref 0.44–1.00)
GFR calc Af Amer: >60 mL/min (ref >60)
GFR calc non Af Amer: >60 mL/min (ref >60)
Glucose, Bld: 168 mg/dL — ABNORMAL HIGH (ref 70–99)
Potassium: 3 mmol/L — ABNORMAL LOW (ref 3.5–5.1)
Sodium: 135 mmol/L (ref 135–145)
Total Bilirubin: 0.5 mg/dL (ref 0.3–1.2)
Total Protein: 5.8 g/dL — ABNORMAL LOW (ref 6.5–8.1)

## 2020-04-21 LAB — CBC WITH DIFFERENTIAL/PLATELET
Abs Immature Granulocytes: 0.04 10*3/uL (ref 0.00–0.07)
Basophils Absolute: 0 10*3/uL (ref 0.0–0.1)
Basophils Relative: 0 %
Eosinophils Absolute: 0 10*3/uL (ref 0.0–0.5)
Eosinophils Relative: 0 %
HCT: 39 % (ref 36.0–46.0)
Hemoglobin: 12.5 g/dL (ref 12.0–15.0)
Immature Granulocytes: 0 %
Lymphocytes Relative: 12 %
Lymphs Abs: 1.2 10*3/uL (ref 0.7–4.0)
MCH: 28.5 pg (ref 26.0–34.0)
MCHC: 32.1 g/dL (ref 30.0–36.0)
MCV: 88.8 fL (ref 80.0–100.0)
Monocytes Absolute: 0.9 10*3/uL (ref 0.1–1.0)
Monocytes Relative: 9 %
Neutro Abs: 7.4 10*3/uL (ref 1.7–7.7)
Neutrophils Relative %: 79 %
Platelets: 317 10*3/uL (ref 150–400)
RBC: 4.39 MIL/uL (ref 3.87–5.11)
RDW: 15.9 % — ABNORMAL HIGH (ref 11.5–15.5)
WBC: 9.6 10*3/uL (ref 4.0–10.5)
nRBC: 0 % (ref 0.0–0.2)

## 2020-04-21 LAB — MAGNESIUM: Magnesium: 1.9 mg/dL (ref 1.7–2.4)

## 2020-04-21 LAB — PHOSPHORUS: Phosphorus: 2.6 mg/dL (ref 2.5–4.6)

## 2020-04-21 MED ORDER — SODIUM CHLORIDE 0.9 % IV BOLUS
1000.0000 mL | Freq: Once | INTRAVENOUS | Status: AC
  Administered 2020-04-21: 1000 mL via INTRAVENOUS

## 2020-04-21 MED ORDER — POTASSIUM CHLORIDE CRYS ER 20 MEQ PO TBCR
40.0000 meq | EXTENDED_RELEASE_TABLET | Freq: Two times a day (BID) | ORAL | Status: DC
  Administered 2020-04-21: 40 meq via ORAL
  Filled 2020-04-21: qty 2

## 2020-04-21 MED ORDER — PNEUMOCOCCAL VAC POLYVALENT 25 MCG/0.5ML IJ INJ: 0.5000 mL | INJECTION | INTRAMUSCULAR | Status: DC

## 2020-04-21 NOTE — Progress Notes (Signed)
Ms. Uber just had surgery yesterday for the colonic obstruction.  She has colon adenocarcinoma.  We will formally see her once the path is back from surgery.  We need to know if lymph nodes are involved.  I suspect she will have stage III disease.  Pre-op CEA was slightly elevated at 14.1.  We will await the surgical path report.  Lattie Haw, MD  Proverbs 17:17

## 2020-04-21 NOTE — Progress Notes (Signed)
1 Day Post-Op   Subjective/Chief Complaint: Passing stool in bag daughter at bedside to help translate   Objective: Vital signs in last 24 hours: Temp:  [97.5 F (36.4 C)-98.5 F (36.9 C)] 97.5 F (36.4 C) (07/31 0452) Pulse Rate:  [60-81] 60 (07/31 0452) Resp:  [12-17] 17 (07/31 0452) BP: (141-179)/(66-79) 141/66 (07/31 0452) SpO2:  [92 %-98 %] 98 % (07/31 0452) Last BM Date: 04/21/20  Intake/Output from previous day: 07/30 0701 - 07/31 0700 In: 1320.2 [I.V.:1068.1; IV Piggyback:252.1] Out: 1245 [Urine:775; Stool:420; Blood:50] Intake/Output this shift: No intake/output data recorded.  Incision/Wound:ostomy viable   Lab Results:  Recent Labs    04/20/20 0057 04/20/20 0057 04/20/20 0837 04/21/20 0249  WBC 4.2  --   --  9.6  HGB 10.5*   < > 12.6 12.5  HCT 33.6*   < > 37.0 39.0  PLT 241  --   --  317   < > = values in this interval not displayed.   BMET Recent Labs    04/20/20 0057 04/20/20 0057 04/20/20 0837 04/20/20 0837 04/20/20 1158 04/21/20 0249  NA 136   < > 140  --   --  135  K 2.7*   < > 4.2   < > 3.4* 3.0*  CL 102   < > 102  --   --  102  CO2 23  --   --   --   --  23  GLUCOSE 103*   < > 115*  --   --  168*  BUN <5*   < > <3*  --   --  6*  CREATININE 0.46   < > 0.40*  --   --  0.52  CALCIUM 7.5*  --   --   --   --  7.2*   < > = values in this interval not displayed.   PT/INR No results for input(s): LABPROT, INR in the last 72 hours. ABG No results for input(s): PHART, HCO3 in the last 72 hours.  Invalid input(s): PCO2, PO2  Studies/Results: No results found.  Anti-infectives: Anti-infectives (From admission, onward)   Start     Dose/Rate Route Frequency Ordered Stop   04/20/20 1200  cefOXitin (MEFOXIN) 2 g in sodium chloride 0.9 % 100 mL IVPB        2 g 200 mL/hr over 30 Minutes Intravenous Every 8 hours 04/20/20 1130 04/20/20 1245   04/19/20 1800  cefOXitin (MEFOXIN) 2 g in sodium chloride 0.9 % 100 mL IVPB  Status:  Discontinued         2 g 200 mL/hr over 30 Minutes Intravenous Every 8 hours 04/19/20 1659 04/20/20 1130   04/17/20 0000  cefTRIAXone (ROCEPHIN) 1 g in sodium chloride 0.9 % 100 mL IVPB        1 g 200 mL/hr over 30 Minutes Intravenous  Once 04/16/20 1604 04/16/20 2347   04/16/20 0115  cefTRIAXone (ROCEPHIN) 1 g in sodium chloride 0.9 % 100 mL IVPB        1 g 200 mL/hr over 30 Minutes Intravenous  Once 04/16/20 0113 04/16/20 0435      Assessment/Plan: s/p Procedure(s): SIGMOID COLECTOMY AND COLOSTOMY (N/A) HTN T2DM Hypothyroidism  LBO secondary to possible sigmoid mass Abnormality of ascending colon on CT - POD 1 sigmoid colectomy and colostomy  Stool in bag  Clear liquids  Urine concentrated bolus 1 L NS DC foley in am  FEN: NPO, IVF, NG LIWS VTE: SCDs, lovenox ID: [email or web address] x1  LOS: 5 days    Turner Daniels MD  04/21/2020

## 2020-04-21 NOTE — Progress Notes (Signed)
PROGRESS NOTE    Bailey Walker  KGM:[id number] DOB: 1946-07-11 DOA: 04/15/2020 PCP: [name], MD    Brief Narrative:  74 y.o. female with medical history significant of hypertension, type 2 diabetes mellitus, hypothyroidism, obesity presents to emergency department with abdominal pain and vomiting since 2 days.  Patient speaks Arabic.  History gathered from patient's daughter at the bedside.  She mentioned that patient has severe generalized abdominal pain and vomiting since July 4 however it got worse since 2 days.  She could not keep anything down, she has 10 out of 10, nonradiating, sharp pain, no aggravating or relieving factors, associated with vomiting 4 times since yesterday.  Vomitus is nonbloody.  She has lost about 7 to 10 pounds since July for the due to abdominal pain and vomiting.  No history of colon cancer in the family.  No night sweats, melena, fever, chills, headache, blurry vision, chest pain, shortness of breath, palpitation, leg swelling.  Reports dysuria since few days.  No foul-smelling or change in urinary frequency, lower back pain.  She had regular bowel movement this morning.  She is a former smoker.  No history of alcohol, illicit drug use.  ED Course: Upon arrival to ED: Patient's vital signs stable.  She received IV fluid bolus, Protonix, Zofran, Dilaudid as needed for pain control and Rocephin for possible UTI in ED.  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  Patient transferred to [facility] for further evaluation and management.  Assessment & Plan:   Principal Problem:   Colon obstruction ([place]) Active Problems:   Hypothyroidism   Essential hypertension   Diabetes mellitus due to underlying condition with unspecified complications ([place])   UTI (urinary tract infection)   Abdominal distension   Colonic mass   Nausea and vomiting in adult   Abnormal CT scan, gastrointestinal tract    Gastritis and gastroduodenitis   Grade II internal hemorrhoids  Abdominal pain with vomiting secondary to obstructing colonic mass: -Concern for possible underlying obstructive colorectal carcinoma. -Patient's vital signs remain stable.  She is afebrile with no leukocytosis.  COVID-19 negative.  Lipase: WNL. -continue IVF hydration for now -Appreciate assistance by GI.  Gastritis noted on EGD. Flex sig now confirming obstructing colonic mass -Appreciate General Surgery assistance.  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  Have consulted oncology, recommendation to follow-up on surgical pathology -Diet to be advanced per general surgery -Of note, CEA is elevated at 14.1 -Will order CT chest for staging  UTI: Patient reports dysuria.  UA is positive for leukocyte/bacteria/WBC -Urine culture is pending.  She received Rocephin in ED.  Continued on rocephin.  Urine culture results are indeterminate  Hypertension:  -metoprolol initially held given soft BP at time of admit -BP now improved on beta blocker.  If patient continues to tolerate p.o. reliably, consider transition back to oral medications   Hypothyroidism: Check TSH in AM -Continue levothyroxine, have transitioned to IV formulation given recent surgery  Type 2 diabetes mellitus: Hold glipizide and Metformin.  Started patient on sliding scale insulin and monitor blood sugar closely  Iron deficiency anemia: H&H is stable at this time -Continue ferrous sulfate -Followed by hematology as outpatient Dr. Alvy Bimler -Recheck CBC in the morning  DVT prophylaxis: Lovenox subq Code Status: Full Family Communication: Pt in room, family at bedside  Status is: Inpatient  Remains inpatient appropriate because:Ongoing diagnostic testing needed not appropriate  for outpatient work up   Dispo: The patient is from: Home               Anticipated d/c is to: Home              Anticipated d/c date is: 2 days              Patient currently is not medically stable to d/c.       Consultants:   GI  General Surgery  Procedures:   EGD, flex sig 7/28  Sigmoid colectomy with end colostomy 04/20/2020  Antimicrobials: Anti-infectives (From admission, onward)   Start     Dose/Rate Route Frequency Ordered Stop   04/20/20 1200  cefOXitin (MEFOXIN) 2 g in sodium chloride 0.9 % 100 mL IVPB        2 g 200 mL/hr over 30 Minutes Intravenous Every 8 hours 04/20/20 1130 04/20/20 1245   04/19/20 1800  cefOXitin (MEFOXIN) 2 g in sodium chloride 0.9 % 100 mL IVPB  Status:  Discontinued        2 g 200 mL/hr over 30 Minutes Intravenous Every 8 hours 04/19/20 1659 04/20/20 1130   04/17/20 0000  cefTRIAXone (ROCEPHIN) 1 g in sodium chloride 0.9 % 100 mL IVPB        1 g 200 mL/hr over 30 Minutes Intravenous  Once 04/16/20 1604 04/16/20 2347   04/16/20 0115  cefTRIAXone (ROCEPHIN) 1 g in sodium chloride 0.9 % 100 mL IVPB        1 g 200 mL/hr over 30 Minutes Intravenous  Once 04/16/20 0113 04/16/20 0435      Subjective: No complaints this morning, patient gives thumbs up when asked how she is doing.  Objective: Vitals:   04/21/20 0139 04/21/20 0452 04/21/20 1026 04/21/20 1423  BP: (!) 164/71 (!) 141/66 (!) 144/75 (!) 154/74  Pulse: 81 60 79 74  Resp: 16 17 17    Temp: 98 F (36.7 C) (!) 97.5 F (36.4 C) 98.9 F (37.2 C) 98.1 F (36.7 C)  TempSrc: Oral Oral Oral Oral  SpO2: 95% 98% 97% 96%  Weight:      Height:        Intake/Output Summary (Last 24 hours) at 04/21/2020 1538 Last data filed at 04/21/2020 1455 Gross per 24 hour  Intake 438.1 ml  Output 1630 ml  Net -1191.9 ml   Filed Weights   04/15/20 1956 04/16/20 1805 04/19/20 0840  Weight: 72.9 kg 72.6 kg 72.6 kg    Examination: General exam: Awake, laying in bed, in nad Respiratory system: Normal respiratory effort, no wheezing Cardiovascular system: regular  rate, s1, s2 Gastrointestinal system: Soft, nondistended, positive BS Central nervous system: CN2-12 grossly intact, strength intact Extremities: Perfused, no clubbing Skin: Normal skin turgor, no notable skin lesions seen Psychiatry: Mood normal // no visual hallucinations   Data Reviewed: I have personally reviewed following labs and imaging studies  CBC: Recent Labs  Lab 04/17/20 0209 04/17/20 0209 04/18/20 0447 04/19/20 0134 04/20/20 0057 04/20/20 0837 04/21/20 0249  WBC 5.6  --  4.0 3.5* 4.2  --  9.6  NEUTROABS  --   --  1.5* 1.3* 2.0  --  7.4  HGB 11.6*   < > 12.2 10.3* 10.5* 12.6 12.5  HCT 37.8   < > 38.9 32.8* 33.6* 37.0 39.0  MCV 90.6  --  90.3 91.9 89.8  --  88.8  PLT 276  --  320 258 241  --  317   < > =  values in this interval not displayed.   Basic Metabolic Panel: Recent Labs  Lab 04/16/20 0042 04/16/20 1559 04/17/20 0209 04/17/20 0209 04/18/20 0447 04/18/20 0447 04/19/20 0134 04/20/20 0057 04/20/20 0837 04/20/20 1158 04/21/20 0249  NA   < >  --  137   < > 138  --  137 136 140  --  135  K   < >  --  3.4*   < > 3.7   < > 2.6* 2.7* 4.2 3.4* 3.0*  CL   < >  --  106   < > 107  --  105 102 102  --  102  CO2   < >  --  21*  --  21*  --  22 23  --   --  23  GLUCOSE   < >  --  122*   < > 111*  --  164* 103* 115*  --  168*  BUN   < >  --  8   < > 15  --  12 <5* <3*  --  6*  CREATININE   < > 0.58 0.59   < > 0.62  --  0.57 0.46 0.40*  --  0.52  CALCIUM   < >  --  7.0*  --  7.5*  --  7.2* 7.5*  --   --  7.2*  MG  --  1.9  --   --  2.2  --  2.0 1.9  --   --  1.9  PHOS  --   --   --   --  2.5  --  1.5* 1.2*  --   --  2.6   < > = values in this interval not displayed.   GFR: Estimated Creatinine Clearance: 54.5 mL/min (by C-G formula based on SCr of 0.52 mg/dL). Liver Function Tests: Recent Labs  Lab 04/17/20 0209 04/18/20 0447 04/19/20 0134 04/20/20 0057 04/21/20 0249  AST 10* 11* 11* 13* 19  ALT 9 8 10 8 14   ALKPHOS 47 50 40 47 41  BILITOT 0.7 0.7  0.7 0.7 0.5  PROT 6.3* 6.2* 5.8* 5.9* 5.8*  ALBUMIN 3.3* 3.2* 3.2* 3.2* 2.9*   Recent Labs  Lab 04/16/20 0042  LIPASE 19   No results for input(s): AMMONIA in the last 168 hours. Coagulation Profile: No results for input(s): INR, PROTIME in the last 168 hours. Cardiac Enzymes: No results for input(s): CKTOTAL, CKMB, CKMBINDEX, TROPONINI in the last 168 hours. BNP (last 3 results) No results for input(s): PROBNP in the last 8760 hours. HbA1C: No results for input(s): HGBA1C in the last 72 hours. CBG: Recent Labs  Lab 04/20/20 2010 04/20/20 2329 04/21/20 0344 04/21/20 0747 04/21/20 1203  GLUCAP 136* 139* 146* 193* 135*   Lipid Profile: No results for input(s): CHOL, HDL, LDLCALC, TRIG, CHOLHDL, LDLDIRECT in the last 72 hours. Thyroid Function Tests: No results for input(s): TSH, T4TOTAL, FREET4, T3FREE, THYROIDAB in the last 72 hours. Anemia Panel: No results for input(s): VITAMINB12, FOLATE, FERRITIN, TIBC, IRON, RETICCTPCT in the last 72 hours. Sepsis Labs: No results for input(s): PROCALCITON, LATICACIDVEN in the last 168 hours.  Recent Results (from the past 240 hour(s))  Urine Culture     Status: Abnormal   Collection Time: 04/16/20  1:13 AM   Specimen: Urine, Random  Result Value Ref Range Status   Specimen Description   Final    URINE, RANDOM Performed at [facility], [address] [place],  [place] [address]    Special Requests   Final    NONE Performed at [facility], [address] [place], [place] [address]    Culture MULTIPLE SPECIES PRESENT, SUGGEST RECOLLECTION (A)  Final   Report Status 04/17/2020 FINAL  Final  SARS Coronavirus 2 by RT PCR (hospital order, performed in [facility] hospital lab) Nasopharyngeal Nasopharyngeal Swab     Status: None   Collection Time: 04/16/20  4:27 AM   Specimen: Nasopharyngeal Swab  Result Value Ref Range Status   SARS Coronavirus 2 NEGATIVE NEGATIVE Final    Comment: (NOTE) SARS-CoV-2  target nucleic acids are NOT DETECTED.  The SARS-CoV-2 RNA is generally detectable in upper and lower respiratory specimens during the acute phase of infection. The lowest concentration of SARS-CoV-2 viral copies this assay can detect is 250 copies / mL. A negative result does not preclude SARS-CoV-2 infection and should not be used as the sole basis for treatment or other patient management decisions.  A negative result may occur with improper specimen collection / handling, submission of specimen other than nasopharyngeal swab, presence of viral mutation(s) within the areas targeted by this assay, and inadequate number of viral copies (<250 copies / mL). A negative result must be combined with clinical observations, patient history, and epidemiological information.  Fact Sheet for Patients:   StrictlyIdeas.no  Fact Sheet for Healthcare Providers: BankingDealers.co.za  This test is not yet approved or  cleared by the [place] FDA and has been authorized for detection and/or diagnosis of SARS-CoV-2 by FDA under an Emergency Use Authorization (EUA).  This EUA will remain in effect (meaning this test can be used) for the duration of the COVID-19 declaration under Section 564(b)(1) of the Act, 21 U.S.C. section 360bbb-3(b)(1), unless the authorization is terminated or revoked sooner.  Performed at [facility], [address] [place], [place] [address]      Radiology Studies: No results found.  Scheduled Meds: . aspirin EC  81 mg Oral Daily  . Chlorhexidine Gluconate Cloth  6 each Topical Daily  . enoxaparin (LOVENOX) injection  40 mg Subcutaneous Q24H  . insulin aspart  0-20 Units Subcutaneous Q4H  . levothyroxine  37.5 mcg Intravenous Daily  . lidocaine  1 patch Transdermal Q24H  . metoprolol tartrate  5 mg Intravenous Q6H  . pantoprazole (PROTONIX) IV  40 mg Intravenous Q24H  . [START ON 04/22/2020] pneumococcal  23 valent vaccine  0.5 mL Intramuscular Tomorrow-1000   Continuous Infusions: . dextrose 5 % and 0.45% NaCl 75 mL/hr at 04/21/20 0547  . lactated ringers 20 mL/hr at 04/18/20 0940  . methocarbamol (ROBAXIN) IV 500 mg (04/21/20 1405)     LOS: 5 days   Marylu Lund, MD [facility] Pager On Amion  If 7PM-7AM, please contact night-coverage 04/21/2020, 3:38 PM

## 2020-04-21 NOTE — Plan of Care (Signed)

## 2020-04-22 ENCOUNTER — Other Ambulatory Visit: Payer: Self-pay | Admitting: Internal Medicine

## 2020-04-22 ENCOUNTER — Inpatient Hospital Stay (HOSPITAL_COMMUNITY): Payer: Medicaid Other

## 2020-04-22 DIAGNOSIS — K219 Gastro-esophageal reflux disease without esophagitis: Secondary | ICD-10-CM

## 2020-04-22 LAB — COMPREHENSIVE METABOLIC PANEL
ALT: 26 U/L (ref 0–44)
AST: 22 U/L (ref 15–41)
Albumin: 2.7 g/dL — ABNORMAL LOW (ref 3.5–5.0)
Alkaline Phosphatase: 49 U/L (ref 38–126)
Anion gap: 9 (ref 5–15)
BUN: 5 mg/dL — ABNORMAL LOW (ref 8–23)
CO2: 23 mmol/L (ref 22–32)
Calcium: 6.7 mg/dL — ABNORMAL LOW (ref 8.9–10.3)
Chloride: 101 mmol/L (ref 98–111)
Creatinine, Ser: 0.52 mg/dL (ref 0.44–1.00)
GFR calc Af Amer: >60 mL/min (ref >60)
GFR calc non Af Amer: >60 mL/min (ref >60)
Glucose, Bld: 127 mg/dL — ABNORMAL HIGH (ref 70–99)
Potassium: 2.8 mmol/L — ABNORMAL LOW (ref 3.5–5.1)
Sodium: 133 mmol/L — ABNORMAL LOW (ref 135–145)
Total Bilirubin: 0.6 mg/dL (ref 0.3–1.2)
Total Protein: 5.3 g/dL — ABNORMAL LOW (ref 6.5–8.1)

## 2020-04-22 LAB — CBC WITH DIFFERENTIAL/PLATELET
Abs Immature Granulocytes: 0.11 10*3/uL — ABNORMAL HIGH (ref 0.00–0.07)
Basophils Absolute: 0 10*3/uL (ref 0.0–0.1)
Basophils Relative: 0 %
Eosinophils Absolute: 0 10*3/uL (ref 0.0–0.5)
Eosinophils Relative: 0 %
HCT: 35.3 % — ABNORMAL LOW (ref 36.0–46.0)
Hemoglobin: 11.3 g/dL — ABNORMAL LOW (ref 12.0–15.0)
Immature Granulocytes: 1 %
Lymphocytes Relative: 14 %
Lymphs Abs: 1.8 10*3/uL (ref 0.7–4.0)
MCH: 28.5 pg (ref 26.0–34.0)
MCHC: 32 g/dL (ref 30.0–36.0)
MCV: 89.1 fL (ref 80.0–100.0)
Monocytes Absolute: 1.2 10*3/uL — ABNORMAL HIGH (ref 0.1–1.0)
Monocytes Relative: 9 %
Neutro Abs: 10.4 10*3/uL — ABNORMAL HIGH (ref 1.7–7.7)
Neutrophils Relative %: 76 %
Platelets: 339 10*3/uL (ref 150–400)
RBC: 3.96 MIL/uL (ref 3.87–5.11)
RDW: 15.8 % — ABNORMAL HIGH (ref 11.5–15.5)
WBC: 13.7 10*3/uL — ABNORMAL HIGH (ref 4.0–10.5)
nRBC: 0 % (ref 0.0–0.2)

## 2020-04-22 LAB — GLUCOSE, CAPILLARY
Glucose-Capillary: 104 mg/dL — ABNORMAL HIGH (ref 70–99)
Glucose-Capillary: 113 mg/dL — ABNORMAL HIGH (ref 70–99)
Glucose-Capillary: 124 mg/dL — ABNORMAL HIGH (ref 70–99)
Glucose-Capillary: 127 mg/dL — ABNORMAL HIGH (ref 70–99)
Glucose-Capillary: 137 mg/dL — ABNORMAL HIGH (ref 70–99)
Glucose-Capillary: 144 mg/dL — ABNORMAL HIGH (ref 70–99)
Glucose-Capillary: 151 mg/dL — ABNORMAL HIGH (ref 70–99)

## 2020-04-22 LAB — PHOSPHORUS: Phosphorus: 1.4 mg/dL — ABNORMAL LOW (ref 2.5–4.6)

## 2020-04-22 LAB — MAGNESIUM: Magnesium: 1.8 mg/dL (ref 1.7–2.4)

## 2020-04-22 MED ORDER — IOHEXOL 300 MG/ML  SOLN
75.0000 mL | Freq: Once | INTRAMUSCULAR | Status: AC | PRN
  Administered 2020-04-22: 75 mL via INTRAVENOUS

## 2020-04-22 MED ORDER — POTASSIUM CHLORIDE CRYS ER 20 MEQ PO TBCR
40.0000 meq | EXTENDED_RELEASE_TABLET | Freq: Three times a day (TID) | ORAL | Status: AC
  Administered 2020-04-22 (×3): 40 meq via ORAL
  Filled 2020-04-22 (×3): qty 2

## 2020-04-22 NOTE — Consult Note (Signed)
[facility] Nurse ostomy consult note Consult for [place] received for new end colostomy created 7/30 by Dr. Ninfa Linden.  Will see on Monday, 04/23/20.  [facility] nursing team will follow for ostomy care and teaching, and will remain available to this patient, the nursing, surgical and medical teams.   Thanks, Maudie Flakes, MSN, RN, [place], [facility]  Pager# [phone]

## 2020-04-22 NOTE — Progress Notes (Signed)
2 Days Post-Op   Subjective/Chief Complaint: Daughter at bedside to translate.  Questions about care Patient doing well.  She is tolerating clear liquids.  Stool in the bag.  Answered questions with the help of her daughter about care.  She is followed by medicine and oncology.  Path is pending.   Objective: Vital signs in last 24 hours: Temp:  [98.1 F (36.7 C)-98.9 F (37.2 C)] 98.8 F (37.1 C) (08/01 0425) Pulse Rate:  [74-93] 89 (08/01 0425) Resp:  [17-18] 17 (08/01 0425) BP: (123-154)/(57-87) 123/57 (08/01 0425) SpO2:  [94 %-97 %] 94 % (08/01 0425) Last BM Date: 04/21/20  Intake/Output from previous day: 07/31 0701 - 08/01 0700 In: 400 [P.O.:400] Out: 1500 [Urine:1400; Stool:100] Intake/Output this shift: No intake/output data recorded.   Incision/Wound:ostomy viable  incision clean dry intact.  Soft nontender. Lab Results:  Recent Labs    04/21/20 0249 04/22/20 0334  WBC 9.6 13.7*  HGB 12.5 11.3*  HCT 39.0 35.3*  PLT 317 339   BMET Recent Labs    04/21/20 0249 04/22/20 0334  NA 135 133*  K 3.0* 2.8*  CL 102 101  CO2 23 23  GLUCOSE 168* 127*  BUN 6* 5*  CREATININE 0.52 0.52  CALCIUM 7.2* 6.7*   PT/INR No results for input(s): LABPROT, INR in the last 72 hours. ABG No results for input(s): PHART, HCO3 in the last 72 hours.  Invalid input(s): PCO2, PO2  Studies/Results: No results found.  Anti-infectives: Anti-infectives (From admission, onward)   Start     Dose/Rate Route Frequency Ordered Stop   04/20/20 1200  cefOXitin (MEFOXIN) 2 g in sodium chloride 0.9 % 100 mL IVPB        2 g 200 mL/hr over 30 Minutes Intravenous Every 8 hours 04/20/20 1130 04/20/20 1245   04/19/20 1800  cefOXitin (MEFOXIN) 2 g in sodium chloride 0.9 % 100 mL IVPB  Status:  Discontinued        2 g 200 mL/hr over 30 Minutes Intravenous Every 8 hours 04/19/20 1659 04/20/20 1130   04/17/20 0000  cefTRIAXone (ROCEPHIN) 1 g in sodium chloride 0.9 % 100 mL IVPB        1  g 200 mL/hr over 30 Minutes Intravenous  Once 04/16/20 1604 04/16/20 2347   04/16/20 0115  cefTRIAXone (ROCEPHIN) 1 g in sodium chloride 0.9 % 100 mL IVPB        1 g 200 mL/hr over 30 Minutes Intravenous  Once 04/16/20 0113 04/16/20 0435      Assessment/Plan: s/p Procedure(s): SIGMOID COLECTOMY AND COLOSTOMY (N/A) LBO secondary to possible sigmoid mass Abnormality of ascending colon on CT - POD 2 sigmoid colectomy and colostomy  Stool in bag  FULL liquids  DC foley   FEN:, IVF VTE: SCDs, lovenox ID: [email or web address] [id number]  LOS: 6 days    Turner Daniels MD  04/22/2020

## 2020-04-22 NOTE — Progress Notes (Signed)
PROGRESS NOTE    Bailey Walker  CHE:[id number] DOB: 1946-05-10 DOA: 04/15/2020 PCP: [name], MD    Brief Narrative:  74 y.o. female with medical history significant of hypertension, type 2 diabetes mellitus, hypothyroidism, obesity presents to emergency department with abdominal pain and vomiting since 2 days.  Patient speaks Arabic.  History gathered from patient's daughter at the bedside.  She mentioned that patient has severe generalized abdominal pain and vomiting since July 4 however it got worse since 2 days.  She could not keep anything down, she has 10 out of 10, nonradiating, sharp pain, no aggravating or relieving factors, associated with vomiting 4 times since yesterday.  Vomitus is nonbloody.  She has lost about 7 to 10 pounds since July for the due to abdominal pain and vomiting.  No history of colon cancer in the family.  No night sweats, melena, fever, chills, headache, blurry vision, chest pain, shortness of breath, palpitation, leg swelling.  Reports dysuria since few days.  No foul-smelling or change in urinary frequency, lower back pain.  She had regular bowel movement this morning.  She is a former smoker.  No history of alcohol, illicit drug use.  ED Course: Upon arrival to ED: Patient's vital signs stable.  She received IV fluid bolus, Protonix, Zofran, Dilaudid as needed for pain control and Rocephin for possible UTI in ED.  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  Patient transferred to [facility] for further evaluation and management.  Assessment & Plan:   Principal Problem:   Colon obstruction ([place]) Active Problems:   Hypothyroidism   Essential hypertension   Diabetes mellitus due to underlying condition with unspecified complications ([place])   UTI (urinary tract infection)   Abdominal distension   Colonic mass   Nausea and vomiting in adult   Abnormal CT scan, gastrointestinal tract    Gastritis and gastroduodenitis   Grade II internal hemorrhoids  Abdominal pain with vomiting secondary to obstructing colonic mass: -Concern for possible underlying obstructive colorectal carcinoma. -Patient's vital signs remain stable.  She is afebrile with no leukocytosis.  COVID-19 negative.  Lipase: WNL. -continue IVF hydration for now -Appreciate assistance by GI.  Gastritis noted on EGD. Flex sig now confirming obstructing colonic mass -Appreciate General Surgery assistance.  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  Have consulted oncology, recommendation to follow-up on surgical pathology -Advancing diet per general surgery -Of note, CEA is elevated at 14.1 -CT chest ordered and personally reviewed.  Findings of a chronic and benign appearing right upper lobe lesion.  Otherwise, no evidence of metastatic disease noted  UTI: Patient reports dysuria.  UA is positive for leukocyte/bacteria/WBC -Urine culture is pending.  She received Rocephin in ED.  Continued on rocephin.  Urine culture results are indeterminate  Hypertension:  -metoprolol initially held given soft BP at time of admit -BP now improved on beta blocker.  Likely transition to oral medications if patient continues to tolerate diet well   Hypothyroidism: Check TSH in AM -Continue levothyroxine, have transitioned to IV formulation given recent surgery likely resume oral medication when patient able to reliably tolerate p.o. intake  Type 2 diabetes mellitus: Hold glipizide and Metformin.  Started patient on sliding scale insulin and monitor blood sugar closely  Iron deficiency anemia: H&H is stable at this time -Continue ferrous sulfate -Followed by hematology as outpatient Dr. Alvy Bimler -Repeat CBC in the morning  DVT prophylaxis: Lovenox subq Code Status: Full Family Communication: Pt in room, family at  bedside  Status is: Inpatient  Remains inpatient appropriate because:Ongoing diagnostic testing needed not appropriate for outpatient work up   Dispo: The patient is from: Home              Anticipated d/c is to: Home              Anticipated d/c date is: 2 days              Patient currently is not medically stable to d/c.       Consultants:   GI  General Surgery  Procedures:   EGD, flex sig 7/28  Sigmoid colectomy with end colostomy 04/20/2020  Antimicrobials: Anti-infectives (From admission, onward)   Start     Dose/Rate Route Frequency Ordered Stop   04/20/20 1200  cefOXitin (MEFOXIN) 2 g in sodium chloride 0.9 % 100 mL IVPB        2 g 200 mL/hr over 30 Minutes Intravenous Every 8 hours 04/20/20 1130 04/20/20 1245   04/19/20 1800  cefOXitin (MEFOXIN) 2 g in sodium chloride 0.9 % 100 mL IVPB  Status:  Discontinued        2 g 200 mL/hr over 30 Minutes Intravenous Every 8 hours 04/19/20 1659 04/20/20 1130   04/17/20 0000  cefTRIAXone (ROCEPHIN) 1 g in sodium chloride 0.9 % 100 mL IVPB        1 g 200 mL/hr over 30 Minutes Intravenous  Once 04/16/20 1604 04/16/20 2347   04/16/20 0115  cefTRIAXone (ROCEPHIN) 1 g in sodium chloride 0.9 % 100 mL IVPB        1 g 200 mL/hr over 30 Minutes Intravenous  Once 04/16/20 0113 04/16/20 0435      Subjective: Without complaints this morning.  Through translator, patient reports feeling well  Objective: Vitals:   04/21/20 1423 04/21/20 2013 04/22/20 0425 04/22/20 1522  BP: (!) 154/74 (!) 139/87 (!) 123/57 (!) 125/55  Pulse: 74 93 89 91  Resp:  18 17 16   Temp: 98.1 F (36.7 C) 98.4 F (36.9 C) 98.8 F (37.1 C) 98.9 F (37.2 C)  TempSrc: Oral Oral Oral Oral  SpO2: 96% 95% 94% 97%  Weight:      Height:        Intake/Output Summary (Last 24 hours) at 04/22/2020 1529 Last data filed at 04/22/2020 1523 Gross per 24 hour  Intake 310 ml  Output 850 ml  Net -540 ml   Filed Weights   04/15/20 1956 04/16/20 1805 04/19/20  0840  Weight: 72.9 kg 72.6 kg 72.6 kg    Examination: General exam: Conversant, in no acute distress Respiratory system: normal chest rise, clear, no audible wheezing Cardiovascular system: regular rhythm, s1-s2 Gastrointestinal system: Nondistended, nontender, pos BS Central nervous system: No seizures, no tremors Extremities: No cyanosis, no joint deformities Skin: No rashes, no pallor Psychiatry: Affect normal // no auditory hallucinations   Data Reviewed: I have personally reviewed following labs and imaging studies  CBC: Recent Labs  Lab 04/18/20 0447 04/18/20 0447 04/19/20 0134 04/20/20 0057 04/20/20 0837 04/21/20 0249 04/22/20 0334  WBC 4.0  --  3.5* 4.2  --  9.6 13.7*  NEUTROABS 1.5*  --  1.3* 2.0  --  7.4 10.4*  HGB 12.2   < > 10.3* 10.5* 12.6 12.5 11.3*  HCT 38.9   < > 32.8* 33.6* 37.0 39.0 35.3*  MCV 90.3  --  91.9 89.8  --  88.8 89.1  PLT 320  --  258 241  --  317 339   < > = values in this interval not displayed.   Basic Metabolic Panel: Recent Labs  Lab 04/18/20 0447 04/18/20 0447 04/19/20 0134 04/19/20 0134 04/20/20 0057 04/20/20 0837 04/20/20 1158 04/21/20 0249 04/22/20 0334  NA 138   < > 137  --  136 140  --  135 133*  K 3.7   < > 2.6*   < > 2.7* 4.2 3.4* 3.0* 2.8*  CL 107   < > 105  --  102 102  --  102 101  CO2 21*  --  22  --  23  --   --  23 23  GLUCOSE 111*   < > 164*  --  103* 115*  --  168* 127*  BUN 15   < > 12  --  <5* <3*  --  6* 5*  CREATININE 0.62   < > 0.57  --  0.46 0.40*  --  0.52 0.52  CALCIUM 7.5*  --  7.2*  --  7.5*  --   --  7.2* 6.7*  MG 2.2  --  2.0  --  1.9  --   --  1.9 1.8  PHOS 2.5  --  1.5*  --  1.2*  --   --  2.6 1.4*   < > = values in this interval not displayed.   GFR: Estimated Creatinine Clearance: 54.5 mL/min (by C-G formula based on SCr of 0.52 mg/dL). Liver Function Tests: Recent Labs  Lab 04/18/20 0447 04/19/20 0134 04/20/20 0057 04/21/20 0249 04/22/20 0334  AST 11* 11* 13* 19 22  ALT 8 10 8 14 26    ALKPHOS 50 40 47 41 49  BILITOT 0.7 0.7 0.7 0.5 0.6  PROT 6.2* 5.8* 5.9* 5.8* 5.3*  ALBUMIN 3.2* 3.2* 3.2* 2.9* 2.7*   Recent Labs  Lab 04/16/20 0042  LIPASE 19   No results for input(s): AMMONIA in the last 168 hours. Coagulation Profile: No results for input(s): INR, PROTIME in the last 168 hours. Cardiac Enzymes: No results for input(s): CKTOTAL, CKMB, CKMBINDEX, TROPONINI in the last 168 hours. BNP (last 3 results) No results for input(s): PROBNP in the last 8760 hours. HbA1C: No results for input(s): HGBA1C in the last 72 hours. CBG: Recent Labs  Lab 04/22/20 0143 04/22/20 0422 04/22/20 0752 04/22/20 1214 04/22/20 1524  GLUCAP 127* 124* 113* 151* 104*   Lipid Profile: No results for input(s): CHOL, HDL, LDLCALC, TRIG, CHOLHDL, LDLDIRECT in the last 72 hours. Thyroid Function Tests: No results for input(s): TSH, T4TOTAL, FREET4, T3FREE, THYROIDAB in the last 72 hours. Anemia Panel: No results for input(s): VITAMINB12, FOLATE, FERRITIN, TIBC, IRON, RETICCTPCT in the last 72 hours. Sepsis Labs: No results for input(s): PROCALCITON, LATICACIDVEN in the last 168 hours.  Recent Results (from the past 240 hour(s))  Urine Culture     Status: Abnormal   Collection Time: 04/16/20  1:13 AM   Specimen: Urine, Random  Result Value Ref Range Status   Specimen Description   Final    URINE, RANDOM Performed at [facility], [address] [place], [place] [address]    Special Requests   Final    NONE Performed at [facility], [address] [place], [place] [address]    Culture MULTIPLE SPECIES PRESENT, SUGGEST RECOLLECTION (A)  Final   Report Status 04/17/2020 FINAL  Final  SARS Coronavirus 2  by RT PCR (hospital order, performed in [facility] hospital lab) Nasopharyngeal Nasopharyngeal Swab     Status: None   Collection Time: 04/16/20  4:27 AM   Specimen: Nasopharyngeal Swab  Result Value Ref Range Status   SARS Coronavirus 2 NEGATIVE  NEGATIVE Final    Comment: (NOTE) SARS-CoV-2 target nucleic acids are NOT DETECTED.  The SARS-CoV-2 RNA is generally detectable in upper and lower respiratory specimens during the acute phase of infection. The lowest concentration of SARS-CoV-2 viral copies this assay can detect is 250 copies / mL. A negative result does not preclude SARS-CoV-2 infection and should not be used as the sole basis for treatment or other patient management decisions.  A negative result may occur with improper specimen collection / handling, submission of specimen other than nasopharyngeal swab, presence of viral mutation(s) within the areas targeted by this assay, and inadequate number of viral copies (<250 copies / mL). A negative result must be combined with clinical observations, patient history, and epidemiological information.  Fact Sheet for Patients:   StrictlyIdeas.no  Fact Sheet for Healthcare Providers: BankingDealers.co.za  This test is not yet approved or  cleared by the [place] FDA and has been authorized for detection and/or diagnosis of SARS-CoV-2 by FDA under an Emergency Use Authorization (EUA).  This EUA will remain in effect (meaning this test can be used) for the duration of the COVID-19 declaration under Section 564(b)(1) of the Act, 21 U.S.C. section 360bbb-3(b)(1), unless the authorization is terminated or revoked sooner.  Performed at [facility] [address] [place], [place] [address]      Radiology Studies: CT CHEST W CONTRAST  Result Date: 04/22/2020 CLINICAL DATA:  Staging for cancer of unknown primary. CT of abdomen and pelvis dated 04/10/2020 showed a constricting type mass of the sigmoid colon. EXAM: CT CHEST WITH CONTRAST TECHNIQUE: Multidetector CT imaging of the chest was performed during intravenous contrast administration. CONTRAST:  73mL OMNIPAQUE IOHEXOL 300 MG/ML  SOLN COMPARISON:  Abdomen and  pelvis CT, 04/16/2020. CT neck dated 08/13/2017. FINDINGS: Cardiovascular: Heart is normal in size. Trace pericardial fluid. Mild right and circumflex coronary artery calcifications. Great vessels are normal in caliber. Mild aortic atherosclerosis. Mediastinum/Nodes: Periphery calcified 1.2 cm left thyroid nodule. No follow-up recommended. No neck base, axillary, mediastinal or hilar masses or enlarged lymph nodes. Trachea and esophagus are unremarkable. Lungs/Pleura: Trace pleural effusions. Nodular opacity at the right anterior apex, measuring 1.3 x 1.1 x 1.3 cm, stable from the neck CT dated 08/13/2017, benign. No other lung nodules. Dependent opacity in the lower lobes and at the diaphragmatic bases of the right middle lobe and left upper lobe lingula consistent with atelectasis. Remainder of the lungs is clear. Upper Abdomen: Small hiatal hernia. Trace ascites adjacent to the liver and spleen. 6 mm, partly imaged, enhancing lesion in the right lobe, segment 6, nonspecific and not visualized on the prior study likely due to differences in enhancement timing. Musculoskeletal: No fracture. No osteoblastic or osteolytic lesions. IMPRESSION: 1. No evidence of metastatic disease to the chest or of a primary malignancy. 2. 1.3 cm nodule at the apex of the right upper lobe, which has been stable since November 2018, benign. 3. No acute findings. 4. Trace pleural effusions and dependent lung atelectasis. No evidence of pneumonia or pulmonary edema. 5. Mild coronary artery calcifications and aortic atherosclerosis. Aortic Atherosclerosis (ICD10-I70.0). Electronically Signed   By: Lajean Manes M.D.   On: 04/22/2020 11:40    Scheduled Meds: .  aspirin EC  81 mg Oral Daily  . Chlorhexidine Gluconate Cloth  6 each Topical Daily  . enoxaparin (LOVENOX) injection  40 mg Subcutaneous Q24H  . insulin aspart  0-20 Units Subcutaneous Q4H  . levothyroxine  37.5 mcg Intravenous Daily  . lidocaine  1 patch Transdermal Q24H   . metoprolol tartrate  5 mg Intravenous Q6H  . pantoprazole (PROTONIX) IV  40 mg Intravenous Q24H  . pneumococcal 23 valent vaccine  0.5 mL Intramuscular Tomorrow-1000  . potassium chloride  40 mEq Oral TID WC   Continuous Infusions: . dextrose 5 % and 0.45% NaCl 75 mL/hr at 04/22/20 0647  . lactated ringers 20 mL/hr at 04/18/20 0940  . methocarbamol (ROBAXIN) IV 500 mg (04/22/20 1349)     LOS: 6 days   Marylu Lund, MD [facility] Pager On Amion  If 7PM-7AM, please contact night-coverage 04/22/2020, 3:29 PM

## 2020-04-23 LAB — COMPREHENSIVE METABOLIC PANEL
ALT: 18 U/L (ref 0–44)
AST: 15 U/L (ref 15–41)
Albumin: 2.6 g/dL — ABNORMAL LOW (ref 3.5–5.0)
Alkaline Phosphatase: 58 U/L (ref 38–126)
Anion gap: 8 (ref 5–15)
BUN: 5 mg/dL — ABNORMAL LOW (ref 8–23)
CO2: 22 mmol/L (ref 22–32)
Calcium: 6.8 mg/dL — ABNORMAL LOW (ref 8.9–10.3)
Chloride: 103 mmol/L (ref 98–111)
Creatinine, Ser: 0.5 mg/dL (ref 0.44–1.00)
GFR calc Af Amer: >60 mL/min (ref >60)
GFR calc non Af Amer: >60 mL/min (ref >60)
Glucose, Bld: 93 mg/dL (ref 70–99)
Potassium: 3.2 mmol/L — ABNORMAL LOW (ref 3.5–5.1)
Sodium: 133 mmol/L — ABNORMAL LOW (ref 135–145)
Total Bilirubin: 1.1 mg/dL (ref 0.3–1.2)
Total Protein: 5.5 g/dL — ABNORMAL LOW (ref 6.5–8.1)

## 2020-04-23 LAB — CBC WITH DIFFERENTIAL/PLATELET
Abs Immature Granulocytes: 0.13 10*3/uL — ABNORMAL HIGH (ref 0.00–0.07)
Basophils Absolute: 0 10*3/uL (ref 0.0–0.1)
Basophils Relative: 0 %
Eosinophils Absolute: 0.1 10*3/uL (ref 0.0–0.5)
Eosinophils Relative: 1 %
HCT: 33.2 % — ABNORMAL LOW (ref 36.0–46.0)
Hemoglobin: 10.4 g/dL — ABNORMAL LOW (ref 12.0–15.0)
Immature Granulocytes: 1 %
Lymphocytes Relative: 15 %
Lymphs Abs: 1.7 10*3/uL (ref 0.7–4.0)
MCH: 28.3 pg (ref 26.0–34.0)
MCHC: 31.3 g/dL (ref 30.0–36.0)
MCV: 90.5 fL (ref 80.0–100.0)
Monocytes Absolute: 1 10*3/uL (ref 0.1–1.0)
Monocytes Relative: 9 %
Neutro Abs: 8.3 10*3/uL — ABNORMAL HIGH (ref 1.7–7.7)
Neutrophils Relative %: 74 %
Platelets: 304 10*3/uL (ref 150–400)
RBC: 3.67 MIL/uL — ABNORMAL LOW (ref 3.87–5.11)
RDW: 15.9 % — ABNORMAL HIGH (ref 11.5–15.5)
WBC: 11.4 10*3/uL — ABNORMAL HIGH (ref 4.0–10.5)
nRBC: 0 % (ref 0.0–0.2)

## 2020-04-23 LAB — GLUCOSE, CAPILLARY
Glucose-Capillary: 105 mg/dL — ABNORMAL HIGH (ref 70–99)
Glucose-Capillary: 116 mg/dL — ABNORMAL HIGH (ref 70–99)
Glucose-Capillary: 129 mg/dL — ABNORMAL HIGH (ref 70–99)
Glucose-Capillary: 148 mg/dL — ABNORMAL HIGH (ref 70–99)
Glucose-Capillary: 162 mg/dL — ABNORMAL HIGH (ref 70–99)
Glucose-Capillary: 98 mg/dL (ref 70–99)

## 2020-04-23 LAB — SURGICAL PATHOLOGY

## 2020-04-23 LAB — MAGNESIUM: Magnesium: 1.7 mg/dL (ref 1.7–2.4)

## 2020-04-23 LAB — PHOSPHORUS
Phosphorus: 1.3 mg/dL — ABNORMAL LOW (ref 2.5–4.6)
Phosphorus: 3.3 mg/dL (ref 2.5–4.6)

## 2020-04-23 MED ORDER — ENSURE ENLIVE PO LIQD
237.0000 mL | Freq: Two times a day (BID) | ORAL | Status: DC
  Administered 2020-04-23 – 2020-04-27 (×8): 237 mL via ORAL

## 2020-04-23 MED ORDER — POTASSIUM CHLORIDE CRYS ER 20 MEQ PO TBCR: 40.0000 meq | EXTENDED_RELEASE_TABLET | Freq: Two times a day (BID) | ORAL | Status: DC

## 2020-04-23 MED ORDER — POTASSIUM PHOSPHATES 15 MMOLE/5ML IV SOLN
45.0000 mmol | Freq: Once | INTRAVENOUS | Status: AC
  Administered 2020-04-23: 45 mmol via INTRAVENOUS
  Filled 2020-04-23: qty 15

## 2020-04-23 MED ORDER — ADULT MULTIVITAMIN W/MINERALS CH
1.0000 | ORAL_TABLET | Freq: Every day | ORAL | Status: DC
  Administered 2020-04-23 – 2020-05-02 (×9): 1 via ORAL
  Filled 2020-04-23 (×10): qty 1

## 2020-04-23 MED ORDER — MAGNESIUM SULFATE 2 GM/50ML IV SOLN
2.0000 g | Freq: Once | INTRAVENOUS | Status: AC
  Administered 2020-04-23: 2 g via INTRAVENOUS
  Filled 2020-04-23: qty 50

## 2020-04-23 NOTE — Consult Note (Signed)
[facility] Nurse ostomy follow up Patient receiving care in [place].  Niece, [name], in room for teaching session.  [name] speaks fluent English and Aramaic. Stoma type/location: LUQ colostomy Stomal assessment/size: 2 inches, round, budded.  Some stoma necrosis present from 10 - 1 o'clock Peristomal assessment: intact, except for a small fluid filled bulla at the lower left border of the former skin barrier.  I was able to remove the barrier and keep the bulla intact.  The new one piece pouch misses the bulla completely Treatment options for stomal/peristomal skin: barrier ring if needed Output: scant serosanginous Ostomy pouching: 1pc.  Education provided: pouch removal, pouch sizing, pouch placement, cleaning with water only around the stoma, opening and closing the pouch.  [name] is very engaged with the teaching session.  She and her sister, [name], plan to be the ones to change the pouching system.  She hopes her aunt will learn to empty it.  They are asking for home health RN for the first while after discharge.  [name] states the patient has Medicaid. Enrolled patient in [place] Start Discharge program: No  Also of note, there is erythema from approximately the lower half of the incision, down along the lower abdominal quadrants, extending posteriorly.  The patient flinches to light palpation.  I have notified Alferd Apa, surgical PA of the erythema.  Val Riles, RN, MSN, CWOCN, CNS-BC, pager [phone]

## 2020-04-23 NOTE — Progress Notes (Addendum)
3 Days Post-Op  Subjective: CC: Patients niece helps translate. Patient up and ambulating with walker in the room. She is tolerating a few bites of FLD but having burping/belching. Had some stool output yesterday per notes. No output from colostomy today except SS output per WOCN. 200cc/24 hours recorded. No flatus or air in bag. Notes redness and pain around midline wound.   Objective: Vital signs in last 24 hours: Temp:  [97.8 F (36.6 C)-98.9 F (37.2 C)] 98.3 F (36.8 C) (08/02 0405) Pulse Rate:  [83-96] 96 (08/02 0405) Resp:  [16-18] 18 (08/02 0405) BP: (125-131)/(55-63) 131/63 (08/02 0405) SpO2:  [97 %-100 %] 100 % (08/02 0405) Last BM Date: 04/23/20  Intake/Output from previous day: 08/01 0701 - 08/02 0700 In: 150 [P.O.:150] Out: 200 [Stool:200] Intake/Output this shift: No intake/output data recorded.  PE: Gen:  Alert, NAD, pleasant Pulm: rate and effort normal Abd: Soft, mild distension, midline wound with honeycomb dressing in place. This was taken down. Midline wound with staples in place. There is blanchable erythema, with some heat and firmness that extends on the sides of midline wound. Suspect hematoma. TTP over this area. Colostomy bag in place. No air or stool in bag (just changed). Stoma round, budded with some stoma necrosis at superior aspect.  Ext:  No LE edema  Skin: Other than as noted above, no rashes noted, warm and dry   Lab Results:  Recent Labs    04/22/20 0334 04/23/20 0256  WBC 13.7* 11.4*  HGB 11.3* 10.4*  HCT 35.3* 33.2*  PLT 339 304   BMET Recent Labs    04/22/20 0334 04/23/20 0256  NA 133* 133*  K 2.8* 3.2*  CL 101 103  CO2 23 22  GLUCOSE 127* 93  BUN 5* 5*  CREATININE 0.52 0.50  CALCIUM 6.7* 6.8*   PT/INR No results for input(s): LABPROT, INR in the last 72 hours. CMP     Component Value Date/Time   NA 133 (L) 04/23/2020 0256   NA 139 07/12/2019 1110   K 3.2 (L) 04/23/2020 0256   CL 103 04/23/2020 0256   CO2 22  04/23/2020 0256   GLUCOSE 93 04/23/2020 0256   BUN 5 (L) 04/23/2020 0256   BUN 15 07/12/2019 1110   CREATININE 0.50 04/23/2020 0256   CREATININE 0.74 01/08/2017 1100   CALCIUM 6.8 (L) 04/23/2020 0256   PROT 5.5 (L) 04/23/2020 0256   PROT 7.1 02/11/2019 1455   ALBUMIN 2.6 (L) 04/23/2020 0256   ALBUMIN 4.4 02/11/2019 1455   AST 15 04/23/2020 0256   ALT 18 04/23/2020 0256   ALKPHOS 58 04/23/2020 0256   BILITOT 1.1 04/23/2020 0256   BILITOT <0.2 02/11/2019 1455   GFRNONAA >60 04/23/2020 0256   GFRNONAA 81 01/08/2017 1100   GFRAA >60 04/23/2020 0256   GFRAA >89 01/08/2017 1100   Lipase     Component Value Date/Time   LIPASE 19 04/16/2020 0042       Studies/Results: CT CHEST W CONTRAST  Result Date: 04/22/2020 CLINICAL DATA:  Staging for cancer of unknown primary. CT of abdomen and pelvis dated 04/10/2020 showed a constricting type mass of the sigmoid colon. EXAM: CT CHEST WITH CONTRAST TECHNIQUE: Multidetector CT imaging of the chest was performed during intravenous contrast administration. CONTRAST:  87mL OMNIPAQUE IOHEXOL 300 MG/ML  SOLN COMPARISON:  Abdomen and pelvis CT, 04/16/2020. CT neck dated 08/13/2017. FINDINGS: Cardiovascular: Heart is normal in size. Trace pericardial fluid. Mild right and circumflex coronary artery calcifications. Great vessels  are normal in caliber. Mild aortic atherosclerosis. Mediastinum/Nodes: Periphery calcified 1.2 cm left thyroid nodule. No follow-up recommended. No neck base, axillary, mediastinal or hilar masses or enlarged lymph nodes. Trachea and esophagus are unremarkable. Lungs/Pleura: Trace pleural effusions. Nodular opacity at the right anterior apex, measuring 1.3 x 1.1 x 1.3 cm, stable from the neck CT dated 08/13/2017, benign. No other lung nodules. Dependent opacity in the lower lobes and at the diaphragmatic bases of the right middle lobe and left upper lobe lingula consistent with atelectasis. Remainder of the lungs is clear. Upper  Abdomen: Small hiatal hernia. Trace ascites adjacent to the liver and spleen. 6 mm, partly imaged, enhancing lesion in the right lobe, segment 6, nonspecific and not visualized on the prior study likely due to differences in enhancement timing. Musculoskeletal: No fracture. No osteoblastic or osteolytic lesions. IMPRESSION: 1. No evidence of metastatic disease to the chest or of a primary malignancy. 2. 1.3 cm nodule at the apex of the right upper lobe, which has been stable since November 2018, benign. 3. No acute findings. 4. Trace pleural effusions and dependent lung atelectasis. No evidence of pneumonia or pulmonary edema. 5. Mild coronary artery calcifications and aortic atherosclerosis. Aortic Atherosclerosis (ICD10-I70.0). Electronically Signed   By: Lajean Manes M.D.   On: 04/22/2020 11:40    Anti-infectives: Anti-infectives (From admission, onward)   Start     Dose/Rate Route Frequency Ordered Stop   04/20/20 1200  cefOXitin (MEFOXIN) 2 g in sodium chloride 0.9 % 100 mL IVPB        2 g 200 mL/hr over 30 Minutes Intravenous Every 8 hours 04/20/20 1130 04/20/20 1245   04/19/20 1800  cefOXitin (MEFOXIN) 2 g in sodium chloride 0.9 % 100 mL IVPB  Status:  Discontinued        2 g 200 mL/hr over 30 Minutes Intravenous Every 8 hours 04/19/20 1659 04/20/20 1130   04/17/20 0000  cefTRIAXone (ROCEPHIN) 1 g in sodium chloride 0.9 % 100 mL IVPB        1 g 200 mL/hr over 30 Minutes Intravenous  Once 04/16/20 1604 04/16/20 2347   04/16/20 0115  cefTRIAXone (ROCEPHIN) 1 g in sodium chloride 0.9 % 100 mL IVPB        1 g 200 mL/hr over 30 Minutes Intravenous  Once 04/16/20 0113 04/16/20 0435       Assessment/Plan HTN T2DM Hypothyroidism ABL anemia - Hgb 10.4  Adenocarcinoma of the colon  -s/p sigmoid colectomy and end colostomy by Dr. Ninfa Linden on 7/30 - POD # 3 - Pre-op CEA mildly elevated at 14. Dr. Marin Olp following. CT chest without metastasis  - Suspect hematoma of midline wound. Will  remove staples today - Still with mild ileus but having some bowel function. Keep on FLD - Await final path - Mobilize - IS  FEN: FLD, K 3.2 , phosphorus 1.3, Mg 1.7 - will replace electrolytes and recheck phos later today  VTE: SCDs, lovenox ID: [email or web address] x1  Foley - Out Follow-Up - Dr. Ninfa Linden   LOS: 7 days    Jillyn Ledger , [facility] Surgery 04/23/2020, 10:33 AM Please see Amion for pager number during day hours 7:00am-4:30pm

## 2020-04-23 NOTE — Progress Notes (Signed)
PROGRESS NOTE    Bailey Walker  GGY:[id number] DOB: Dec 26, 1945 DOA: 04/15/2020 PCP: [name], MD    Brief Narrative:  74 y.o. female with medical history significant of hypertension, type 2 diabetes mellitus, hypothyroidism, obesity presents to emergency department with abdominal pain and vomiting since 2 days.  Patient speaks Arabic.  History gathered from patient's daughter at the bedside.  She mentioned that patient has severe generalized abdominal pain and vomiting since July 4 however it got worse since 2 days.  She could not keep anything down, she has 10 out of 10, nonradiating, sharp pain, no aggravating or relieving factors, associated with vomiting 4 times since yesterday.  Vomitus is nonbloody.  She has lost about 7 to 10 pounds since July for the due to abdominal pain and vomiting.  No history of colon cancer in the family.  No night sweats, melena, fever, chills, headache, blurry vision, chest pain, shortness of breath, palpitation, leg swelling.  Reports dysuria since few days.  No foul-smelling or change in urinary frequency, lower back pain.  She had regular bowel movement this morning.  She is a former smoker.  No history of alcohol, illicit drug use.  ED Course: Upon arrival to ED: Patient's vital signs stable.  She received IV fluid bolus, Protonix, Zofran, Dilaudid as needed for pain control and Rocephin for possible UTI in ED.  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  Patient transferred to [facility] for further evaluation and management.  Assessment & Plan:   Principal Problem:   Colon obstruction ([place]) Active Problems:   Hypothyroidism   Essential hypertension   Diabetes mellitus due to underlying condition with unspecified complications ([place])   UTI (urinary tract infection)   Abdominal distension   Colonic mass   Nausea and vomiting in adult   Abnormal CT scan, gastrointestinal tract    Gastritis and gastroduodenitis   Grade II internal hemorrhoids  Abdominal pain with vomiting secondary to obstructing colonic mass: -Concern for possible underlying obstructive colorectal carcinoma. -Patient's vital signs remain stable.  She is afebrile with no leukocytosis.  COVID-19 negative.  Lipase: WNL. -continue IVF hydration for now -Appreciate assistance by GI.  Gastritis noted on EGD. Flex sig now confirming obstructing colonic mass -Appreciate General Surgery assistance.  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  Have consulted oncology, recommendation to follow-up on surgical pathology -Advancing diet per general surgery -Of note, CEA is elevated at 14.1 -CT chest without evidence of metastatic disease noted  UTI: Patient reports dysuria.  UA is positive for leukocyte/bacteria/WBC -Urine culture is pending.  She received Rocephin in ED.  Continued on rocephin.  Urine culture results are indeterminate  Hypertension:  -metoprolol initially held given soft BP at time of admit -BP now improved on beta blocker.  Likely transition to oral medications if patient continues to tolerate diet well   Hypothyroidism: Check TSH remains pending -Continue levothyroxine, have transitioned to IV formulation given recent surgery likely resume oral medication when patient able to reliably tolerate p.o. intake  Type 2 diabetes mellitus: Hold glipizide and Metformin.  Started patient on sliding scale insulin and monitor blood sugar closely Per diabetic coordinator, glycemic trends stable  Iron deficiency anemia: H&H is stable at this time -Continue ferrous sulfate -Followed by hematology as outpatient Dr. Alvy Bimler -Continue to follow cbc trends  DVT prophylaxis: Lovenox subq Code Status: Full Family Communication: Pt in room,  family at bedside  Status is: Inpatient  Remains inpatient  appropriate because:Ongoing diagnostic testing needed not appropriate for outpatient work up   Dispo: The patient is from: Home              Anticipated d/c is to: Home              Anticipated d/c date is: 2 days              Patient currently is not medically stable to d/c.       Consultants:   GI  General Surgery  Oncology  Procedures:   EGD, flex sig 7/28  Sigmoid colectomy with end colostomy 04/20/2020  Antimicrobials: Anti-infectives (From admission, onward)   Start     Dose/Rate Route Frequency Ordered Stop   04/20/20 1200  cefOXitin (MEFOXIN) 2 g in sodium chloride 0.9 % 100 mL IVPB        2 g 200 mL/hr over 30 Minutes Intravenous Every 8 hours 04/20/20 1130 04/20/20 1245   04/19/20 1800  cefOXitin (MEFOXIN) 2 g in sodium chloride 0.9 % 100 mL IVPB  Status:  Discontinued        2 g 200 mL/hr over 30 Minutes Intravenous Every 8 hours 04/19/20 1659 04/20/20 1130   04/17/20 0000  cefTRIAXone (ROCEPHIN) 1 g in sodium chloride 0.9 % 100 mL IVPB        1 g 200 mL/hr over 30 Minutes Intravenous  Once 04/16/20 1604 04/16/20 2347   04/16/20 0115  cefTRIAXone (ROCEPHIN) 1 g in sodium chloride 0.9 % 100 mL IVPB        1 g 200 mL/hr over 30 Minutes Intravenous  Once 04/16/20 0113 04/16/20 0435      Subjective: No complaints this AM  Objective: Vitals:   04/22/20 1522 04/22/20 2000 04/23/20 0405 04/23/20 1613  BP: (!) 125/55 (!) 126/61 (!) 131/63 (!) 156/72  Pulse: 91 83 96 (!) 107  Resp: 16 17 18 18   Temp: 98.9 F (37.2 C) 97.8 F (36.6 C) 98.3 F (36.8 C) 98.1 F (36.7 C)  TempSrc: Oral Oral Oral   SpO2: 97% 98% 100% 97%  Weight:      Height:        Intake/Output Summary (Last 24 hours) at 04/23/2020 1723 Last data filed at 04/23/2020 1200 Gross per 24 hour  Intake 350 ml  Output 50 ml  Net 300 ml   Filed Weights   04/15/20 1956 04/16/20 1805 04/19/20 0840  Weight: 72.9 kg 72.6 kg 72.6 kg    Examination: General exam: Awake, laying in bed, in  nad Respiratory system: Normal respiratory effort, no wheezing Cardiovascular system: regular rate, s1, s2 Gastrointestinal system: Soft, nondistended, positive BS Central nervous system: CN2-12 grossly intact, strength intact Extremities: Perfused, no clubbing Skin: Normal skin turgor, no notable skin lesions seen Psychiatry: Mood normal // no visual hallucinations   Data Reviewed: I have personally reviewed following labs and imaging studies  CBC: Recent Labs  Lab 04/19/20 0134 04/19/20 0134 04/20/20 0057 04/20/20 0837 04/21/20 0249 04/22/20 0334 04/23/20 0256  WBC 3.5*  --  4.2  --  9.6 13.7* 11.4*  NEUTROABS 1.3*  --  2.0  --  7.4 10.4* 8.3*  HGB 10.3*   < > 10.5* 12.6 12.5 11.3* 10.4*  HCT 32.8*   < > 33.6* 37.0 39.0 35.3* 33.2*  MCV 91.9  --  89.8  --  88.8 89.1 90.5  PLT 258  --  241  --  317 339 304   < > = values in this interval not displayed.   Basic Metabolic Panel: Recent Labs  Lab 04/19/20 0134 04/19/20 0134 04/20/20 0057 04/20/20 0057 04/20/20 1610 04/20/20 1158 04/21/20 0249 04/22/20 0334 04/23/20 0256  NA 137   < > 136  --  140  --  135 133* 133*  K 2.6*   < > 2.7*   < > 4.2 3.4* 3.0* 2.8* 3.2*  CL 105   < > 102  --  102  --  102 101 103  CO2 22  --  23  --   --   --  23 23 22   GLUCOSE 164*   < > 103*  --  115*  --  168* 127* 93  BUN 12   < > <5*  --  <3*  --  6* 5* 5*  CREATININE 0.57   < > 0.46  --  0.40*  --  0.52 0.52 0.50  CALCIUM 7.2*  --  7.5*  --   --   --  7.2* 6.7* 6.8*  MG 2.0  --  1.9  --   --   --  1.9 1.8 1.7  PHOS 1.5*  --  1.2*  --   --   --  2.6 1.4* 1.3*   < > = values in this interval not displayed.   GFR: Estimated Creatinine Clearance: 54.5 mL/min (by C-G formula based on SCr of 0.5 mg/dL). Liver Function Tests: Recent Labs  Lab 04/19/20 0134 04/20/20 0057 04/21/20 0249 04/22/20 0334 04/23/20 0256  AST 11* 13* 19 22 15   ALT 10 8 14 26 18   ALKPHOS 40 47 41 49 58  BILITOT 0.7 0.7 0.5 0.6 1.1  PROT 5.8* 5.9* 5.8*  5.3* 5.5*  ALBUMIN 3.2* 3.2* 2.9* 2.7* 2.6*   No results for input(s): LIPASE, AMYLASE in the last 168 hours. No results for input(s): AMMONIA in the last 168 hours. Coagulation Profile: No results for input(s): INR, PROTIME in the last 168 hours. Cardiac Enzymes: No results for input(s): CKTOTAL, CKMB, CKMBINDEX, TROPONINI in the last 168 hours. BNP (last 3 results) No results for input(s): PROBNP in the last 8760 hours. HbA1C: No results for input(s): HGBA1C in the last 72 hours. CBG: Recent Labs  Lab 04/23/20 0012 04/23/20 0403 04/23/20 0758 04/23/20 1236 04/23/20 1609  GLUCAP 98 105* 116* 162* 129*   Lipid Profile: No results for input(s): CHOL, HDL, LDLCALC, TRIG, CHOLHDL, LDLDIRECT in the last 72 hours. Thyroid Function Tests: No results for input(s): TSH, T4TOTAL, FREET4, T3FREE, THYROIDAB in the last 72 hours. Anemia Panel: No results for input(s): VITAMINB12, FOLATE, FERRITIN, TIBC, IRON, RETICCTPCT in the last 72 hours. Sepsis Labs: No results for input(s): PROCALCITON, LATICACIDVEN in the last 168 hours.  Recent Results (from the past 240 hour(s))  Urine Culture     Status: Abnormal   Collection Time: 04/16/20  1:13 AM   Specimen: Urine, Random  Result Value Ref Range Status   Specimen Description   Final    URINE, RANDOM Performed at [facility], [address] [place], [place] [address]    Special Requests   Final    NONE Performed at [facility], [address] [place], [place] [address]    Culture MULTIPLE SPECIES PRESENT, SUGGEST RECOLLECTION (A)  Final   Report Status 04/17/2020 FINAL  Final  SARS Coronavirus 2 by RT PCR (hospital order, performed in [facility] hospital lab) Nasopharyngeal  Nasopharyngeal Swab     Status: None   Collection Time: 04/16/20  4:27 AM   Specimen: Nasopharyngeal Swab  Result Value Ref Range Status   SARS Coronavirus 2 NEGATIVE NEGATIVE Final    Comment: (NOTE) SARS-CoV-2 target nucleic acids  are NOT DETECTED.  The SARS-CoV-2 RNA is generally detectable in upper and lower respiratory specimens during the acute phase of infection. The lowest concentration of SARS-CoV-2 viral copies this assay can detect is 250 copies / mL. A negative result does not preclude SARS-CoV-2 infection and should not be used as the sole basis for treatment or other patient management decisions.  A negative result may occur with improper specimen collection / handling, submission of specimen other than nasopharyngeal swab, presence of viral mutation(s) within the areas targeted by this assay, and inadequate number of viral copies (<250 copies / mL). A negative result must be combined with clinical observations, patient history, and epidemiological information.  Fact Sheet for Patients:   StrictlyIdeas.no  Fact Sheet for Healthcare Providers: BankingDealers.co.za  This test is not yet approved or  cleared by the [place] FDA and has been authorized for detection and/or diagnosis of SARS-CoV-2 by FDA under an Emergency Use Authorization (EUA).  This EUA will remain in effect (meaning this test can be used) for the duration of the COVID-19 declaration under Section 564(b)(1) of the Act, 21 U.S.C. section 360bbb-3(b)(1), unless the authorization is terminated or revoked sooner.  Performed at [facility], [address], [place], [place] [address]      Radiology Studies: CT CHEST W CONTRAST  Result Date: 04/22/2020 CLINICAL DATA:  Staging for cancer of unknown primary. CT of abdomen and pelvis dated 04/10/2020 showed a constricting type mass of the sigmoid colon. EXAM: CT CHEST WITH CONTRAST TECHNIQUE: Multidetector CT imaging of the chest was performed during intravenous contrast administration. CONTRAST:  73mL OMNIPAQUE IOHEXOL 300 MG/ML  SOLN COMPARISON:  Abdomen and pelvis CT, 04/16/2020. CT neck dated 08/13/2017. FINDINGS:  Cardiovascular: Heart is normal in size. Trace pericardial fluid. Mild right and circumflex coronary artery calcifications. Great vessels are normal in caliber. Mild aortic atherosclerosis. Mediastinum/Nodes: Periphery calcified 1.2 cm left thyroid nodule. No follow-up recommended. No neck base, axillary, mediastinal or hilar masses or enlarged lymph nodes. Trachea and esophagus are unremarkable. Lungs/Pleura: Trace pleural effusions. Nodular opacity at the right anterior apex, measuring 1.3 x 1.1 x 1.3 cm, stable from the neck CT dated 08/13/2017, benign. No other lung nodules. Dependent opacity in the lower lobes and at the diaphragmatic bases of the right middle lobe and left upper lobe lingula consistent with atelectasis. Remainder of the lungs is clear. Upper Abdomen: Small hiatal hernia. Trace ascites adjacent to the liver and spleen. 6 mm, partly imaged, enhancing lesion in the right lobe, segment 6, nonspecific and not visualized on the prior study likely due to differences in enhancement timing. Musculoskeletal: No fracture. No osteoblastic or osteolytic lesions. IMPRESSION: 1. No evidence of metastatic disease to the chest or of a primary malignancy. 2. 1.3 cm nodule at the apex of the right upper lobe, which has been stable since November 2018, benign. 3. No acute findings. 4. Trace pleural effusions and dependent lung atelectasis. No evidence of pneumonia or pulmonary edema. 5. Mild coronary artery calcifications and aortic atherosclerosis. Aortic Atherosclerosis (ICD10-I70.0). Electronically Signed   By: Lajean Manes M.D.   On: 04/22/2020 11:40    Scheduled Meds: . aspirin EC  81 mg Oral Daily  . Chlorhexidine Gluconate Cloth  6 each Topical Daily  . enoxaparin (LOVENOX) injection  40 mg Subcutaneous Q24H  . feeding supplement (ENSURE ENLIVE)  237 mL Oral BID BM  . insulin aspart  0-20 Units Subcutaneous Q4H  . levothyroxine  37.5 mcg Intravenous Daily  . lidocaine  1 patch Transdermal Q24H   . metoprolol tartrate  5 mg Intravenous Q6H  . multivitamin with minerals  1 tablet Oral Daily  . pantoprazole (PROTONIX) IV  40 mg Intravenous Q24H  . pneumococcal 23 valent vaccine  0.5 mL Intramuscular Tomorrow-1000   Continuous Infusions: . dextrose 5 % and 0.45% NaCl 75 mL/hr at 04/23/20 0300  . lactated ringers 20 mL/hr at 04/18/20 0940  . methocarbamol (ROBAXIN) IV 500 mg (04/23/20 1426)  . potassium PHOSPHATE IVPB (in mmol) 45 mmol (04/23/20 1328)     LOS: 7 days   Marylu Lund, MD [facility] Pager On Amion  If 7PM-7AM, please contact night-coverage 04/23/2020, 5:23 PM

## 2020-04-23 NOTE — Progress Notes (Signed)
Nutrition Follow-up  DOCUMENTATION CODES:   Obesity unspecified  INTERVENTION:   -Ensure Enlive po BID, each supplement provides 350 kcal and 20 grams of protein -MVI with minerals daily  NUTRITION DIAGNOSIS:   Inadequate oral intake related to altered GI function as evidenced by NPO status.  Progressing; advanced to full liquids on 04/22/20  GOAL:   Patient will meet greater than or equal to 90% of their needs  Progressing   MONITOR:   PO intake, Supplement acceptance, Diet advancement, Labs, Weight trends, Skin, I & O's  REASON FOR ASSESSMENT:   Malnutrition Screening Tool    ASSESSMENT:   Bailey Walker is a 74 y.o. female with medical history significant of hypertension, type 2 diabetes mellitus, hypothyroidism, obesity presents to emergency department with abdominal pain and vomiting since 2 days  7/29- s/p flex sig- revealed complete obstruction of sigmoid colon (biopsied) 7/30- s/p Procedure(s): SIGMOID COLECTOMY AND END COLOSTOMY  8/1- advanced to full liquid diet  Reviewed I/O's: -50 ml x 24 hours and +4 L since admission  Colostomy: 200 ml x 24 hours  Pt unavailable at time of visit. Per surgery notes, path is still pending.   She was just advanced to a full liquid diet yesterday; noted meal completion 50%.  Medications reviewed and include dextrose 5%-0.45% sodium chloride infusion @ 75 ml/hr.  Labs reviewed: Na: 133, K: 3.2, Phos: 1.3 (on IV supplementation), CBGS: 98-116 (inpatient orders for glycemic control are 0-20 units insulin aspart every 4 hours).   Diet Order:   Diet Order            Diet full liquid Room service appropriate? Yes; Fluid consistency: Thin  Diet effective now                 EDUCATION NEEDS:   No education needs have been identified at this time  Skin:  Skin Assessment: Skin Integrity Issues: Skin Integrity Issues:: Incisions Incisions: closed abdomen  Last BM:  04/23/20 (50 ml output via  colostomy)  Height:   Ht Readings from Last 1 Encounters:  04/19/20 5\' 1"  (1.549 m)    Weight:   Wt Readings from Last 1 Encounters:  04/19/20 72.6 kg    Ideal Body Weight:  47.7 kg  BMI:  Body mass index is 30.24 kg/m.  Estimated Nutritional Needs:   Kcal:  1600-1800  Protein:  85-100 grams  Fluid:  > 1.6 L    Loistine Chance, RD, LDN, [place] Registered Dietitian II Certified Diabetes Care and Education Specialist Please refer to [facility] for RD and/or RD on-call/weekend/after hours pager

## 2020-04-23 NOTE — Progress Notes (Signed)
Inpatient Diabetes Program Recommendations  AACE/ADA: New Consensus Statement on Inpatient Glycemic Control (2015)  Target Ranges:  Prepandial:   less than 140 mg/dL      Peak postprandial:   less than 180 mg/dL (1-2 hours)      Critically ill patients:  140 - 180 mg/dL   Results for DEVANIE, GALANTI (MRN [id number]) as of 04/23/2020 10:57  Ref. Range 04/22/2020 00:10 04/22/2020 01:43 04/22/2020 04:22 04/22/2020 07:52 04/22/2020 12:14 04/22/2020 15:24 04/22/2020 19:56  Glucose-Capillary Latest Ref Range: 70 - 99 mg/dL 137 (H)  3 units NOVOLOG  127 (H) 124 (H) 113 (H) 151 (H)  4 units NOVOLOG  104 (H) 144 (H)  3 units NOVOLOG    Results for MARVELL, STAVOLA (MRN [id number]) as of 04/23/2020 10:57  Ref. Range 04/23/2020 00:12 04/23/2020 04:03 04/23/2020 07:58  Glucose-Capillary Latest Ref Range: 70 - 99 mg/dL 98 105 (H) 116 (H)   Results for SHURONDA, SANTINO (MRN [id number]) as of 04/23/2020 10:57  Ref. Range 04/16/2020 16:05  Hemoglobin A1C Latest Ref Range: 4.8 - 5.6 % 5.3   Admit with: Adenocarcinoma of the colon  History: Type 2 Diabetes  Home DM Meds: Glipizide 5 mg BID       Metformin 500 mg BID  Current Orders: Novolog Resistant Correction Scale/ SSI (0-20 units) Q4 hours    Getting Full Liquid PO diet.  CBGs well controlled with Current Novolog SSi regimen.  Will follow and assist with glucose management as desired.     --Will follow patient during hospitalization--  Wyn Quaker RN, MSN, CDE Diabetes Coordinator Inpatient Glycemic Control Team Team Pager: [phone] (8a-5p)

## 2020-04-24 ENCOUNTER — Encounter (HOSPITAL_COMMUNITY): Payer: Self-pay | Admitting: Family Medicine

## 2020-04-24 LAB — BASIC METABOLIC PANEL
Anion gap: 9 (ref 5–15)
BUN: <5 mg/dL — ABNORMAL LOW (ref 8–23)
CO2: 22 mmol/L (ref 22–32)
Calcium: 6.7 mg/dL — ABNORMAL LOW (ref 8.9–10.3)
Chloride: 102 mmol/L (ref 98–111)
Creatinine, Ser: 0.49 mg/dL (ref 0.44–1.00)
GFR calc Af Amer: >60 mL/min (ref >60)
GFR calc non Af Amer: >60 mL/min (ref >60)
Glucose, Bld: 113 mg/dL — ABNORMAL HIGH (ref 70–99)
Potassium: 3 mmol/L — ABNORMAL LOW (ref 3.5–5.1)
Sodium: 133 mmol/L — ABNORMAL LOW (ref 135–145)

## 2020-04-24 LAB — CBC WITH DIFFERENTIAL/PLATELET
Abs Immature Granulocytes: 0.12 10*3/uL — ABNORMAL HIGH (ref 0.00–0.07)
Basophils Absolute: 0 10*3/uL (ref 0.0–0.1)
Basophils Relative: 0 %
Eosinophils Absolute: 0.1 10*3/uL (ref 0.0–0.5)
Eosinophils Relative: 1 %
HCT: 32.1 % — ABNORMAL LOW (ref 36.0–46.0)
Hemoglobin: 10.2 g/dL — ABNORMAL LOW (ref 12.0–15.0)
Immature Granulocytes: 1 %
Lymphocytes Relative: 15 %
Lymphs Abs: 1.4 10*3/uL (ref 0.7–4.0)
MCH: 28.5 pg (ref 26.0–34.0)
MCHC: 31.8 g/dL (ref 30.0–36.0)
MCV: 89.7 fL (ref 80.0–100.0)
Monocytes Absolute: 1 10*3/uL (ref 0.1–1.0)
Monocytes Relative: 12 %
Neutro Abs: 6.2 10*3/uL (ref 1.7–7.7)
Neutrophils Relative %: 71 %
Platelets: 332 10*3/uL (ref 150–400)
RBC: 3.58 MIL/uL — ABNORMAL LOW (ref 3.87–5.11)
RDW: 15.8 % — ABNORMAL HIGH (ref 11.5–15.5)
WBC: 8.8 10*3/uL (ref 4.0–10.5)
nRBC: 0 % (ref 0.0–0.2)

## 2020-04-24 LAB — GLUCOSE, CAPILLARY
Glucose-Capillary: 101 mg/dL — ABNORMAL HIGH (ref 70–99)
Glucose-Capillary: 101 mg/dL — ABNORMAL HIGH (ref 70–99)
Glucose-Capillary: 125 mg/dL — ABNORMAL HIGH (ref 70–99)
Glucose-Capillary: 125 mg/dL — ABNORMAL HIGH (ref 70–99)
Glucose-Capillary: 128 mg/dL — ABNORMAL HIGH (ref 70–99)
Glucose-Capillary: 136 mg/dL — ABNORMAL HIGH (ref 70–99)
Glucose-Capillary: 139 mg/dL — ABNORMAL HIGH (ref 70–99)
Glucose-Capillary: 139 mg/dL — ABNORMAL HIGH (ref 70–99)
Glucose-Capillary: 151 mg/dL — ABNORMAL HIGH (ref 70–99)

## 2020-04-24 LAB — MAGNESIUM: Magnesium: 2.1 mg/dL (ref 1.7–2.4)

## 2020-04-24 LAB — PHOSPHORUS: Phosphorus: 2 mg/dL — ABNORMAL LOW (ref 2.5–4.6)

## 2020-04-24 MED ORDER — MORPHINE SULFATE (PF) 2 MG/ML IV SOLN
2.0000 mg | INTRAVENOUS | Status: DC | PRN
  Administered 2020-04-26 – 2020-04-29 (×2): 2 mg via INTRAVENOUS
  Filled 2020-04-24 (×2): qty 1

## 2020-04-24 MED ORDER — LEVOTHYROXINE SODIUM 75 MCG PO TABS
75.0000 ug | ORAL_TABLET | Freq: Every day | ORAL | Status: DC
  Administered 2020-04-25 – 2020-05-02 (×7): 75 ug via ORAL
  Filled 2020-04-24 (×7): qty 1

## 2020-04-24 MED ORDER — METOPROLOL TARTRATE 25 MG PO TABS
25.0000 mg | ORAL_TABLET | Freq: Two times a day (BID) | ORAL | Status: DC
  Administered 2020-04-24 – 2020-05-02 (×16): 25 mg via ORAL
  Filled 2020-04-24 (×16): qty 1

## 2020-04-24 MED ORDER — DOXYCYCLINE HYCLATE 100 MG PO TABS
100.0000 mg | ORAL_TABLET | Freq: Two times a day (BID) | ORAL | Status: DC
  Administered 2020-04-24 – 2020-04-25 (×3): 100 mg via ORAL
  Filled 2020-04-24 (×3): qty 1

## 2020-04-24 MED ORDER — PANTOPRAZOLE SODIUM 40 MG PO TBEC
40.0000 mg | DELAYED_RELEASE_TABLET | Freq: Every day | ORAL | Status: DC
  Administered 2020-04-24 – 2020-05-02 (×9): 40 mg via ORAL
  Filled 2020-04-24 (×9): qty 1

## 2020-04-24 NOTE — Plan of Care (Signed)
  Problem: Education: Goal: Knowledge of General Education information will improve Description: Including pain rating scale, medication(s)/side effects and non-pharmacologic comfort measures Outcome: Progressing   Problem: Health Behavior/Discharge Planning: Goal: Ability to manage health-related needs will improve Outcome: Progressing   Problem: Activity: Goal: Risk for activity intolerance will decrease Outcome: Progressing   Problem: Nutrition: Goal: Adequate nutrition will be maintained Outcome: Progressing   Problem: Coping: Goal: Level of anxiety will decrease Outcome: Progressing   Problem: Elimination: Goal: Will not experience complications related to bowel motility Outcome: Progressing Goal: Will not experience complications related to urinary retention Outcome: Progressing

## 2020-04-24 NOTE — Consult Note (Addendum)
[facility]  [phone] [phone]   MEDICAL ONCOLOGY - INITIAL CONSULTATION  Referral MD: Dr. 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 Munroe is a 74 year old female with a past medical history significant for hypertension, type 2 diabetes mellitus, hypothyroidism.  She presented to the emergency room with a 2-day history of abdominal pain and vomiting.  CT abdomen and pelvis performed on admission showed a large amount of stool in the colon, focal transition point at the level of the sigmoid colon with findings highly suspicious for an obstructing colorectal carcinoma, mildly enlarged regional in IMV lymph nodes concerning for nodal metastatic disease.  A CEA was obtained on 04/16/2020 which was mildly elevated at 14.1. The patient underwent EGD on 04/18/2020 which showed a normal esophagus, gastritis, 2 gastric polyps which were biopsied.  Biopsies showed chronic gastritis, negative H. pylori, no malignancy identified.  She underwent colonoscopy on 04/19/2020 which showed hemorrhoids and malignant completely obstructing tumor in the sigmoid colon which was biopsied.  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  The patient was seen by general surgery and underwent a sigmoid colectomy and end colostomy on 04/20/2020.  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  The patient had a CT of the chest with contrast performed on 04/22/2020 which showed no evidence of metastatic disease to the chest or a primary malignancy, 1.3 cm nodule in the apex of the right upper lobe which is stable since November 2018 and felt to be benign, trace pleural effusions and dependent lung atelectasis without evidence of pneumonia or pulmonary edema.   The patient's niece is at the bedside  today.  She assists with translation and answers most questions.  Prior to admission, the patient was having abdominal pain, nausea, vomiting, and severe constipation.  Her appetite has been decreased and the niece estimates that she has lost about 7 to 10 pounds recently.  No melena or hematochezia was noted.  She has not been having any headaches or dizziness.  No chest pain or shortness of breath reported.  Still having abdominal discomfort from her surgery.  Now has an open wound  -had midline wound opened for evacuation of hematoma.  She is having regular dressing changes.  Colostomy functioning well.  Per nursing, the patient has been getting up to the recliner.  The patient is single and has no children.  She lives with one of her nieces.  Denies history of alcohol use.  Has a history of smoking cigarettes but quit 2015.  Medical oncology was asked see the patient to make recommendations regarding her newly diagnosed colon adenocarcinoma.   Past Medical History:  Diagnosis Date   Diabetes mellitus without complication ([place])    Hypertension    SVT (supraventricular tachycardia) ([place])    Thyroid disease   :  Past Surgical History:  Procedure Laterality Date   APPENDECTOMY     BIOPSY  04/18/2020   Procedure: BIOPSY;  Surgeon: Lavena Bullion, DO;  Location: [facility];  Service: Gastroenterology;;   BIOPSY  04/19/2020   Procedure: BIOPSY;  Surgeon: Lavena Bullion, DO;  Location: [facility];  Service: Gastroenterology;;   BREAST SURGERY     Fatty tissue on biopsy   ESOPHAGOGASTRODUODENOSCOPY (EGD) WITH PROPOFOL N/A 04/18/2020   Procedure: ESOPHAGOGASTRODUODENOSCOPY (EGD) WITH PROPOFOL;  Surgeon: Lavena Bullion, DO;  Location: [facility];  Service:  Gastroenterology;  Laterality: N/A;   EYE SURGERY Bilateral    april and march 2019 for cataracts.    fatty gland     left wrist , right breast   FLEXIBLE SIGMOIDOSCOPY N/A 04/18/2020   Procedure: FLEXIBLE  SIGMOIDOSCOPY;  Surgeon: Lavena Bullion, DO;  Location: [facility];  Service: Gastroenterology;  Laterality: N/A;   FLEXIBLE SIGMOIDOSCOPY N/A 04/19/2020   Procedure: FLEXIBLE SIGMOIDOSCOPY;  Surgeon: Lavena Bullion, DO;  Location: [facility];  Service: Gastroenterology;  Laterality: N/A;   KNEE SURGERY     left knee   LAPAROTOMY N/A 04/20/2020   Procedure: SIGMOID COLECTOMY AND COLOSTOMY;  Surgeon: Coralie Keens, MD;  Location: [facility];  Service: General;  Laterality: N/A;   SUBMUCOSAL TATTOO INJECTION  04/19/2020   Procedure: SUBMUCOSAL TATTOO INJECTION;  Surgeon: Lavena Bullion, DO;  Location: [facility];  Service: Gastroenterology;;  :  Current Facility-Administered Medications  Medication Dose Route Frequency Provider Last Rate Last Admin   aspirin EC tablet 81 mg  81 mg Oral Daily Jill Alexanders, PA-C   81 mg at 04/24/20 8527   Chlorhexidine Gluconate Cloth 2 % PADS 6 each  6 each Topical Daily Donne Hazel, MD   6 each at 04/24/20 0913   dextrose 5 %-0.45 % sodium chloride infusion   Intravenous Continuous Jill Alexanders, PA-C 75 mL/hr at 04/23/20 2107 New Bag at 04/23/20 2107   doxycycline (VIBRA-TABS) tablet 100 mg  100 mg Oral Q12H Jillyn Ledger, PA-C   100 mg at 04/24/20 0918   enoxaparin (LOVENOX) injection 40 mg  40 mg Subcutaneous Q24H Jill Alexanders, PA-C   40 mg at 04/23/20 1423   feeding supplement (ENSURE ENLIVE) (ENSURE ENLIVE) liquid 237 mL  237 mL Oral BID BM Donne Hazel, MD   237 mL at 04/24/20 0912   hydrALAZINE (APRESOLINE) injection 10 mg  10 mg Intravenous Q6H PRN Jill Alexanders, PA-C       insulin aspart (novoLOG) injection 0-20 Units  0-20 Units Subcutaneous Q4H Jill Alexanders, PA-C   3 Units at 04/24/20 7824   lactated ringers infusion   Intravenous Continuous Jill Alexanders, PA-C 20 mL/hr at 04/18/20 0940 Continued from Pre-op at 04/19/20 1000   levothyroxine (SYNTHROID, LEVOTHROID) injection  37.5 mcg  37.5 mcg Intravenous Daily Jill Alexanders, PA-C   37.5 mcg at 04/24/20 0912   lidocaine (LIDODERM) 5 % 1 patch  1 patch Transdermal Q24H Jill Alexanders, PA-C   1 patch at 04/23/20 1022   LORazepam (ATIVAN) injection 1 mg  1 mg Intravenous Once PRN Jill Alexanders, PA-C       methocarbamol (ROBAXIN) 500 mg in dextrose 5 % 50 mL IVPB  500 mg Intravenous Q8H Jillyn Ledger, PA-C 100 mL/hr at 04/24/20 0618 500 mg at 04/24/20 0618   metoprolol tartrate (LOPRESSOR) injection 5 mg  5 mg Intravenous Q6H Jill Alexanders, PA-C   5 mg at 04/24/20 2353   morphine 2 MG/ML injection 2 mg  2 mg Intravenous Q3H PRN Jillyn Ledger, PA-C       multivitamin with minerals tablet 1 tablet  1 tablet Oral Daily Donne Hazel, MD   1 tablet at 04/23/20 1625   ondansetron (ZOFRAN) tablet 4 mg  4 mg Oral Q6H PRN Jill Alexanders, PA-C       Or   ondansetron (ZOFRAN) injection 4 mg  4 mg Intravenous Q6H PRN Jill Alexanders, PA-C  4 mg at 04/17/20 0051   oxyCODONE (Oxy IR/ROXICODONE) immediate release tablet 5-10 mg  5-10 mg Oral Q4H PRN Jillyn Ledger, PA-C   10 mg at 04/24/20 0912   pantoprazole (PROTONIX) injection 40 mg  40 mg Intravenous Q24H Jill Alexanders, PA-C   40 mg at 04/24/20 3154   pneumococcal 23 valent vaccine (PNEUMOVAX-23) injection 0.5 mL  0.5 mL Intramuscular Tomorrow-1000 Donne Hazel, MD         Allergies  Allergen Reactions   Nitroglycerin Nausea And Vomiting   Ampicillin Other (See Comments)    Per allergy test  :  Family History  Problem Relation Age of Onset   Breast cancer Cousin        mat and pat sides   Cancer Sister        cancer everywhere   Colon cancer Neg Hx    Esophageal cancer Neg Hx    Rectal cancer Neg Hx   :  Social History   Socioeconomic History   Marital status: Single    Spouse name: Not on file   Number of children: 0   Years of education: Not on file   Highest education level: Not on  file  Occupational History   Occupation: retired  Tobacco Use   Smoking status: Former Smoker    Quit date: 12/29/2013    Years since quitting: 6.3   Smokeless tobacco: Never Used  Vaping Use   Vaping Use: Never used  Substance and Sexual Activity   Alcohol use: No   Drug use: No   Sexual activity: Not on file  Other Topics Concern   Not on file  Social History Narrative   Not on file   Social Determinants of Health   Financial Resource Strain:    Difficulty of Paying Living Expenses:   Food Insecurity:    Worried About Charity fundraiser in the Last Year:    Arboriculturist in the Last Year:   Transportation Needs:    Film/video editor (Medical):    Lack of Transportation (Non-Medical):   Physical Activity:    Days of Exercise per Week:    Minutes of Exercise per Session:   Stress:    Feeling of Stress :   Social Connections:    Frequency of Communication with Friends and Family:    Frequency of Social Gatherings with Friends and Family:    Attends Religious Services:    Active Member of Clubs or Organizations:    Attends Music therapist:    Marital Status:   Intimate Partner Violence:    Fear of Current or Ex-Partner:    Emotionally Abused:    Physically Abused:    Sexually Abused:   :  Review of Systems: A comprehensive 14 point review of systems was negative except as noted in the HPI.  Exam: Patient Vitals for the past 24 hrs:  BP Temp Temp src Pulse Resp SpO2  04/24/20 0409 129/61 98.2 F (36.8 C) Oral 87 15 95 %  04/23/20 2024 (!) 135/56 98.8 F (37.1 C) Oral 93 18 96 %  04/23/20 1613 (!) 156/72 98.1 F (36.7 C) -- (!) 107 18 97 %    General:  well-nourished in no acute distress.   Eyes:  no scleral icterus.   ENT:  There were no oropharyngeal lesions.   Neck was without thyromegaly.   Lymphatics:  Negative cervical, supraclavicular or axillary adenopathy.   Respiratory: lungs were clear bilaterally  without wheezing or crackles.   Cardiovascular:  Regular rate and rhythm, S1/S2, without murmur, rub or gallop.  There was no pedal edema.   GI: Colostomy with soft brown stool.  Midline incision with dressing in place and a small amount of serous drainage noted on the dressing. Musculoskeletal:  no spinal tenderness of palpation of vertebral spine.   Skin exam was without echymosis, petichae.  See photo in surgical note from 04/24/2020 for mid abdominal wound findings. Neuro exam was nonfocal. Patient was alert and oriented.  Attention was good.   Language was appropriate.  Mood was normal without depression.  Speech was not pressured.  Thought content was not tangential.    Lab Results  Component Value Date   WBC 8.8 04/24/2020   HGB 10.2 (L) 04/24/2020   HCT 32.1 (L) 04/24/2020   PLT 332 04/24/2020   GLUCOSE 93 04/23/2020   ALT 18 04/23/2020   AST 15 04/23/2020   NA 133 (L) 04/23/2020   K 3.2 (L) 04/23/2020   CL 103 04/23/2020   CREATININE 0.50 04/23/2020   BUN 5 (L) 04/23/2020   CO2 22 04/23/2020    DG Abd 1 View  Result Date: 04/18/2020 CLINICAL DATA:  Nasogastric tube placement EXAM: ABDOMEN - 1 VIEW COMPARISON:  None. FLUOROSCOPY TIME:  3 minutes 18 seconds; 23.70 mGy; 1 acquired image FINDINGS: Nasogastric tube tip and side port are in the distal stomach. Relative paucity of gas seen on somewhat limited supine abdominal image. No free air appreciable. Visualized lung bases clear. IMPRESSION: Nasogastric tube tip and side port in distal stomach. Paucity of gas of uncertain etiology on limited study. No free air evident on current supine examination. Electronically Signed   By: Lowella Grip III M.D.   On: 04/18/2020 07:54   CT CHEST W CONTRAST  Result Date: 04/22/2020 CLINICAL DATA:  Staging for cancer of unknown primary. CT of abdomen and pelvis dated 04/10/2020 showed a constricting type mass of the sigmoid colon. EXAM: CT CHEST WITH CONTRAST TECHNIQUE: Multidetector CT  imaging of the chest was performed during intravenous contrast administration. CONTRAST:  100m OMNIPAQUE IOHEXOL 300 MG/ML  SOLN COMPARISON:  Abdomen and pelvis CT, 04/16/2020. CT neck dated 08/13/2017. FINDINGS: Cardiovascular: Heart is normal in size. Trace pericardial fluid. Mild right and circumflex coronary artery calcifications. Great vessels are normal in caliber. Mild aortic atherosclerosis. Mediastinum/Nodes: Periphery calcified 1.2 cm left thyroid nodule. No follow-up recommended. No neck base, axillary, mediastinal or hilar masses or enlarged lymph nodes. Trachea and esophagus are unremarkable. Lungs/Pleura: Trace pleural effusions. Nodular opacity at the right anterior apex, measuring 1.3 x 1.1 x 1.3 cm, stable from the neck CT dated 08/13/2017, benign. No other lung nodules. Dependent opacity in the lower lobes and at the diaphragmatic bases of the right middle lobe and left upper lobe lingula consistent with atelectasis. Remainder of the lungs is clear. Upper Abdomen: Small hiatal hernia. Trace ascites adjacent to the liver and spleen. 6 mm, partly imaged, enhancing lesion in the right lobe, segment 6, nonspecific and not visualized on the prior study likely due to differences in enhancement timing. Musculoskeletal: No fracture. No osteoblastic or osteolytic lesions. IMPRESSION: 1. No evidence of metastatic disease to the chest or of a primary malignancy. 2. 1.3 cm nodule at the apex of the right upper lobe, which has been stable since November 2018, benign. 3. No acute findings. 4. Trace pleural effusions and dependent lung atelectasis. No evidence of pneumonia or pulmonary edema. 5. Mild coronary artery calcifications  and aortic atherosclerosis. Aortic Atherosclerosis (ICD10-I70.0). Electronically Signed   By: Lajean Manes M.D.   On: 04/22/2020 11:40   CT Abdomen Pelvis W Contrast  Result Date: 04/16/2020 CLINICAL DATA:  Abdominal pain.  Emesis. EXAM: CT ABDOMEN AND PELVIS WITH CONTRAST  TECHNIQUE: Multidetector CT imaging of the abdomen and pelvis was performed using the standard protocol following bolus administration of intravenous contrast. CONTRAST:  138m OMNIPAQUE IOHEXOL 300 MG/ML  SOLN COMPARISON:  March 25, 2020 FINDINGS: Lower chest: The lung bases are clear. The heart is enlarged. Hepatobiliary: The liver is normal. Normal gallbladder.There is no biliary ductal dilation. Pancreas: Normal contours without ductal dilatation. No peripancreatic fluid collection. Spleen: Unremarkable. Adrenals/Urinary Tract: --Adrenal glands: Unremarkable. --Right kidney/ureter: No hydronephrosis or radiopaque kidney stones. --Left kidney/ureter: No hydronephrosis or radiopaque kidney stones. --Urinary bladder: Unremarkable. Stomach/Bowel: --Stomach/Duodenum: There is a small to moderate-sized hiatal hernia. Again noted are thickened rugal folds similar to prior study. --Small bowel: There are dilated loops of small bowel involving the distal and terminal ileum. These loops of small bowel demonstrate fecalization consistent with slow transit. --Colon: . There is significant distention of the colon which has progressed since the prior study. There is a large amount of stool in the colon. There is an apparent transition point in the sigmoid colon (axial series 2, image 73). At this level, there is wall thickening of the sigmoid colon. There are surrounding enlarged regional lymph nodes. There is an additional focal area of narrowing involving the ascending colon (axial series 2, image 45). There are adjacent mildly enlarged regional lymph nodes. --Appendix: Not visualized. No right lower quadrant inflammation or free fluid. Vascular/Lymphatic: Atherosclerotic calcification is present within the non-aneurysmal abdominal aorta, without hemodynamically significant stenosis. --No retroperitoneal lymphadenopathy. --there are enlarged regional lymph nodes about the sigmoid colon. There are enlarged lymph nodes along  the course of the IMV (axial series 2, image 58). These lymph nodes measure up to approximately 0.9 cm. --No pelvic or inguinal lymphadenopathy. Reproductive: Unremarkable Other: No ascites or free air. The abdominal wall is normal. Musculoskeletal. There is degenerative disc disease throughout the lumbar spine. There is a posterior disc osteophyte complex at the L4-L5 level resulting in spinal canal stenosis. There is no acute displaced fracture. IMPRESSION: 1. Large amount of stool in the colon with fecalization of the distal and terminal ileum. 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 There are mildly enlarged regional and IMV lymph nodes concerning for nodal metastatic disease. Surgical consultation is recommended. 2. Additional focal area of narrowing involving the ascending colon with adjacent mildly enlarged lymph nodes as detailed above. While this may be secondary to underdistention or peristalsis, an additional mass is not excluded. 3. No free air free fluid. Additional chronic findings as detailed above Aortic Atherosclerosis (ICD10-I70.0). These results were called by telephone at the time of interpretation on 04/16/2020 at 2:22 am [facility] who verbally acknowledged these results. Electronically Signed   By: CConstance HolsterM.D.   On: 04/16/2020 02:24   CT Abdomen Pelvis W Contrast  Result Date: 03/25/2020 CLINICAL DATA:  Abdominal distension, unable to tolerate solid or liquid diet. EXAM: CT ABDOMEN AND PELVIS WITH CONTRAST TECHNIQUE: Multidetector CT imaging of the abdomen and pelvis was performed using the standard protocol following bolus administration of intravenous contrast. CONTRAST:  1075mOMNIPAQUE IOHEXOL 300 MG/ML  SOLN COMPARISON:  CT 08/12/2017 FINDINGS: Lower  chest: Dependent and subsegmental atelectatic changes in the lung bases. Otherwise clear.  Cardiac size at the upper limits of normal. No pericardial effusion. Coronary artery calcifications are present. Hepatobiliary: Diffuse mild hepatic hypoattenuation may reflect some mild hepatic steatosis. Smooth surface contour. Smooth blush of contrast again seen in the posterior right lobe liver (2/34) normalizing on delayed phase imaging may reflect a transient hepatic attenuation difference or flash filling hemangioma. No concerning liver lesions. Normal gallbladder and biliary tree without calcified gallstones. Pancreas: Partial fatty replacement of the pancreas. No pancreatic ductal dilatation or surrounding inflammatory changes. Spleen: Normal in size without focal abnormality. Adrenals/Urinary Tract: Normal adrenal glands. Kidneys enhance and excrete create symmetrically. No concerning renal lesions. No urolithiasis or hydronephrosis. Urinary bladder is largely decompressed at the time of exam and therefore poorly evaluated by CT imaging. No gross bladder abnormality. Stomach/Bowel: Moderate hiatal hernia, similar in size and appearance to comparison. Prominent rugal folds of the stomach. Mild thickening towards the antrum likely related to contraction. Fluid-filled appearance of the more mid to distal small bowel without evidence of frank distention or evidence of high-grade obstruction. Some minimal mural thickening may be present towards the ilium as well. The colon is also diffusely fluid-filled. Mild edematous mural thickening of the colon is present more pronounced at the rectosigmoid. The appendix is surgically absent. Vascular/Lymphatic: Atherosclerotic calcifications within the abdominal aorta and branch vessels. No aneurysm or ectasia. No enlarged abdominopelvic lymph nodes. Reactive appearing nonenlarged nodes present in the mesentery. Reproductive: Retroflexed, fibroid uterus. Stable coarse calcification of the left ovary. Concerning adnexal lesions are seen. Other: Small volume of fluid seen  within the anterior abdomen adjacent several of the fluid-filled loops of bowel, favored to be reactive. No abdominopelvic free air. No bowel containing hernias. Musculoskeletal: Dextrocurvature of the lumbar spine, apex L3. Multilevel degenerative changes are present in the imaged portions of the spine. Additional degenerative changes in the hips and pelvis. No acute osseous abnormality or suspicious osseous lesion. IMPRESSION: 1. Fluid-filled appearance of the small bowel without frank distention or evidence of obstruction. Additional fluid-filled and mildly edematous colon. Findings may reflect an enterocolitis, which could be infectious or inflammatory in etiology. 2. Small volume of fluid in the anterior mesentery, likely reactive. 3. Moderate hiatal hernia, similar in size and appearance to comparison to comparison. 4. Thickened rugal folds, could correlate for clinical symptoms of gastritis. 5. Retroflexed, fibroid uterus. 6. Aortic Atherosclerosis (ICD10-I70.0). Electronically Signed   By: Lovena Le M.D.   On: 03/25/2020 19:23     DG Abd 1 View  Result Date: 04/18/2020 CLINICAL DATA:  Nasogastric tube placement EXAM: ABDOMEN - 1 VIEW COMPARISON:  None. FLUOROSCOPY TIME:  3 minutes 18 seconds; 23.70 mGy; 1 acquired image FINDINGS: Nasogastric tube tip and side port are in the distal stomach. Relative paucity of gas seen on somewhat limited supine abdominal image. No free air appreciable. Visualized lung bases clear. IMPRESSION: Nasogastric tube tip and side port in distal stomach. Paucity of gas of uncertain etiology on limited study. No free air evident on current supine examination. Electronically Signed   By: Lowella Grip III M.D.   On: 04/18/2020 07:54   CT CHEST W CONTRAST  Result Date: 04/22/2020 CLINICAL DATA:  Staging for cancer of unknown primary. CT of abdomen and pelvis dated 04/10/2020 showed a constricting type mass of the sigmoid colon. EXAM: CT CHEST WITH CONTRAST TECHNIQUE:  Multidetector CT imaging of the chest was performed during intravenous contrast administration. CONTRAST:  60m OMNIPAQUE IOHEXOL  300 MG/ML  SOLN COMPARISON:  Abdomen and pelvis CT, 04/16/2020. CT neck dated 08/13/2017. FINDINGS: Cardiovascular: Heart is normal in size. Trace pericardial fluid. Mild right and circumflex coronary artery calcifications. Great vessels are normal in caliber. Mild aortic atherosclerosis. Mediastinum/Nodes: Periphery calcified 1.2 cm left thyroid nodule. No follow-up recommended. No neck base, axillary, mediastinal or hilar masses or enlarged lymph nodes. Trachea and esophagus are unremarkable. Lungs/Pleura: Trace pleural effusions. Nodular opacity at the right anterior apex, measuring 1.3 x 1.1 x 1.3 cm, stable from the neck CT dated 08/13/2017, benign. No other lung nodules. Dependent opacity in the lower lobes and at the diaphragmatic bases of the right middle lobe and left upper lobe lingula consistent with atelectasis. Remainder of the lungs is clear. Upper Abdomen: Small hiatal hernia. Trace ascites adjacent to the liver and spleen. 6 mm, partly imaged, enhancing lesion in the right lobe, segment 6, nonspecific and not visualized on the prior study likely due to differences in enhancement timing. Musculoskeletal: No fracture. No osteoblastic or osteolytic lesions. IMPRESSION: 1. No evidence of metastatic disease to the chest or of a primary malignancy. 2. 1.3 cm nodule at the apex of the right upper lobe, which has been stable since November 2018, benign. 3. No acute findings. 4. Trace pleural effusions and dependent lung atelectasis. No evidence of pneumonia or pulmonary edema. 5. Mild coronary artery calcifications and aortic atherosclerosis. Aortic Atherosclerosis (ICD10-I70.0). Electronically Signed   By: Lajean Manes M.D.   On: 04/22/2020 11:40   CT Abdomen Pelvis W Contrast  Result Date: 04/16/2020 CLINICAL DATA:  Abdominal pain.  Emesis. EXAM: CT ABDOMEN AND PELVIS WITH  CONTRAST TECHNIQUE: Multidetector CT imaging of the abdomen and pelvis was performed using the standard protocol following bolus administration of intravenous contrast. CONTRAST:  156m OMNIPAQUE IOHEXOL 300 MG/ML  SOLN COMPARISON:  March 25, 2020 FINDINGS: Lower chest: The lung bases are clear. The heart is enlarged. Hepatobiliary: The liver is normal. Normal gallbladder.There is no biliary ductal dilation. Pancreas: Normal contours without ductal dilatation. No peripancreatic fluid collection. Spleen: Unremarkable. Adrenals/Urinary Tract: --Adrenal glands: Unremarkable. --Right kidney/ureter: No hydronephrosis or radiopaque kidney stones. --Left kidney/ureter: No hydronephrosis or radiopaque kidney stones. --Urinary bladder: Unremarkable. Stomach/Bowel: --Stomach/Duodenum: There is a small to moderate-sized hiatal hernia. Again noted are thickened rugal folds similar to prior study. --Small bowel: There are dilated loops of small bowel involving the distal and terminal ileum. These loops of small bowel demonstrate fecalization consistent with slow transit. --Colon: . There is significant distention of the colon which has progressed since the prior study. There is a large amount of stool in the colon. There is an apparent transition point in the sigmoid colon (axial series 2, image 73). At this level, there is wall thickening of the sigmoid colon. There are surrounding enlarged regional lymph nodes. There is an additional focal area of narrowing involving the ascending colon (axial series 2, image 45). There are adjacent mildly enlarged regional lymph nodes. --Appendix: Not visualized. No right lower quadrant inflammation or free fluid. Vascular/Lymphatic: Atherosclerotic calcification is present within the non-aneurysmal abdominal aorta, without hemodynamically significant stenosis. --No retroperitoneal lymphadenopathy. --there are enlarged regional lymph nodes about the sigmoid colon. There are enlarged lymph nodes  along the course of the IMV (axial series 2, image 58). These lymph nodes measure up to approximately 0.9 cm. --No pelvic or inguinal lymphadenopathy. Reproductive: Unremarkable Other: No ascites or free air. The abdominal wall is normal. Musculoskeletal. There is degenerative disc disease throughout the lumbar spine. There  is a posterior disc osteophyte complex at the L4-L5 level resulting in spinal canal stenosis. There is no acute displaced fracture. IMPRESSION: 1. Large amount of stool in the colon with fecalization of the distal and terminal ileum. 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 There are mildly enlarged regional and IMV lymph nodes concerning for nodal metastatic disease. Surgical consultation is recommended. 2. Additional focal area of narrowing involving the ascending colon with adjacent mildly enlarged lymph nodes as detailed above. While this may be secondary to underdistention or peristalsis, an additional mass is not excluded. 3. No free air free fluid. Additional chronic findings as detailed above Aortic Atherosclerosis (ICD10-I70.0). These results were called by telephone at the time of interpretation on 04/16/2020 at 2:22 am to provider [facility] , who verbally acknowledged these results. Electronically Signed   By: Constance Holster M.D.   On: 04/16/2020 02:24   CT Abdomen Pelvis W Contrast  Result Date: 03/25/2020 CLINICAL DATA:  Abdominal distension, unable to tolerate solid or liquid diet. EXAM: CT ABDOMEN AND PELVIS WITH CONTRAST TECHNIQUE: Multidetector CT imaging of the abdomen and pelvis was performed using the standard protocol following bolus administration of intravenous contrast. CONTRAST:  183m OMNIPAQUE IOHEXOL 300 MG/ML  SOLN COMPARISON:  CT 08/12/2017 FINDINGS: Lower chest: Dependent and subsegmental atelectatic changes in the lung bases. Otherwise  clear. Cardiac size at the upper limits of normal. No pericardial effusion. Coronary artery calcifications are present. Hepatobiliary: Diffuse mild hepatic hypoattenuation may reflect some mild hepatic steatosis. Smooth surface contour. Smooth blush of contrast again seen in the posterior right lobe liver (2/34) normalizing on delayed phase imaging may reflect a transient hepatic attenuation difference or flash filling hemangioma. No concerning liver lesions. Normal gallbladder and biliary tree without calcified gallstones. Pancreas: Partial fatty replacement of the pancreas. No pancreatic ductal dilatation or surrounding inflammatory changes. Spleen: Normal in size without focal abnormality. Adrenals/Urinary Tract: Normal adrenal glands. Kidneys enhance and excrete create symmetrically. No concerning renal lesions. No urolithiasis or hydronephrosis. Urinary bladder is largely decompressed at the time of exam and therefore poorly evaluated by CT imaging. No gross bladder abnormality. Stomach/Bowel: Moderate hiatal hernia, similar in size and appearance to comparison. Prominent rugal folds of the stomach. Mild thickening towards the antrum likely related to contraction. Fluid-filled appearance of the more mid to distal small bowel without evidence of frank distention or evidence of high-grade obstruction. Some minimal mural thickening may be present towards the ilium as well. The colon is also diffusely fluid-filled. Mild edematous mural thickening of the colon is present more pronounced at the rectosigmoid. The appendix is surgically absent. Vascular/Lymphatic: Atherosclerotic calcifications within the abdominal aorta and branch vessels. No aneurysm or ectasia. No enlarged abdominopelvic lymph nodes. Reactive appearing nonenlarged nodes present in the mesentery. Reproductive: Retroflexed, fibroid uterus. Stable coarse calcification of the left ovary. Concerning adnexal lesions are seen. Other: Small volume of fluid  seen within the anterior abdomen adjacent several of the fluid-filled loops of bowel, favored to be reactive. No abdominopelvic free air. No bowel containing hernias. Musculoskeletal: Dextrocurvature of the lumbar spine, apex L3. Multilevel degenerative changes are present in the imaged portions of the spine. Additional degenerative changes in the hips and pelvis. No acute osseous abnormality or suspicious osseous lesion. IMPRESSION: 1. Fluid-filled appearance of the small bowel without frank distention or evidence of obstruction. Additional fluid-filled and mildly edematous colon. Findings may reflect an  enterocolitis, which could be infectious or inflammatory in etiology. 2. Small volume of fluid in the anterior mesentery, likely reactive. 3. Moderate hiatal hernia, similar in size and appearance to comparison to comparison. 4. Thickened rugal folds, could correlate for clinical symptoms of gastritis. 5. Retroflexed, fibroid uterus. 6. Aortic Atherosclerosis (ICD10-I70.0). Electronically Signed   By: Lovena Le M.D.   On: 03/25/2020 19:23    Pathology:  SURGICAL PATHOLOGY  CASE: [id number]  PATIENT: Lanissa Sabado  Surgical Pathology Report   Clinical History: colon obstruction (cm)   FINAL MICROSCOPIC DIAGNOSIS:   A. COLON, SIGMOID, RESECTION:  - Invasive adenocarcinoma with extracellular mucin, moderately  differentiated, spanning 4.9 cm.  - Tumor involves serosa.  - Resection margins are negative.  - Metastatic carcinoma in one of fifteen lymph nodes (1/15).  - Satellite nodule (x1).  - Calcified serosal nodule.  - See oncology table.   ONCOLOGY TABLE:   COLON AND RECTUM: Resection, Including Transanal Disk Excision of  Rectal Neoplasms   Procedure: Sigmoid colon resection  Tumor Site: Sigmoid colon  Tumor Size: 1.9 cm  Macroscopic Tumor Perforation: Not identified  Histologic Type: Invasive adenocarcinoma with extracellular mucin  Histologic Grade: G2, moderately  differentiated  Tumor Extension: Tumor invades visceral peritoneum.  Margins: Uninvolved by tumor  Treatment Effect: No known presurgical therapy  Lymphovascular Invasion: Present  Perineural Invasion: Present  Tumor Deposits: Present (x1)  Regional Lymph Nodes:    Number of Lymph Nodes Involved: 1    Number of Lymph Nodes Examined: 15  Pathologic Stage Classification (pTNM, AJCC 8th Edition): pT4a, pN1a  Ancillary Studies: MMR was normal on the biopsy. Repeat testing can be  performed upon request.  Representative Tumor Block: A8  Comments: None.   Assessment and Plan:  1.  Stage IIIb colon adenocarcinoma (pT4, pN1a, M0), MSI normal 2.  Open abdominal wound secondary to hematoma 3.  Mild anemia due to underlying malignancy and history of iron deficiency anemia 4.  Hypertension 5.  Type 2 diabetes mellitus 6.  Hypothyroidism  -Discussed biopsy results and scans with the patient and her family member.  Scans did not show any evidence of distant metastases.  Based on pathology, this would be a stage IIIb colon adenocarcinoma.  Standard treatment would be to proceed with systemic chemotherapy once she has healed from her surgery.  Chemotherapy plan per Dr. Marin Olp.   -The patient has an open abdominal wound.  Discussed with the family member that we will need for her wound to be healed before we can consider proceeding with chemotherapy.  Chemotherapy could make her at higher risk for infection and may also interfere with wound healing so it would be best for this wound to be healed before consideration of treatment.  Management of surgical wound per general surgery and wound care nurse.  On doxycycline per general surgery due to concern for cellulitis. -The patient has been seen by hematology in the past for iron deficiency anemia.  Iron studies on 03/12/2020 were adequate.  She has received IV iron in the past.  Received Feraheme 510 mg IV on 03/12/2020.  Will follow hemoglobin and iron  studies closely and repeat IV iron as needed. -Management of chronic medical conditions per hospitalist.  Thank you for this referral.   Mikey Bussing, DNP, AGPCNP-BC, AOCNP  ADDENDUM: I agree with the above assessment. Ms. Meinders has stage IIIB [phone]) adenocarcinoma of the sigmoid colon. She only had 1+ lymph node.  I really think that the key as to whether  or not she will get adjuvant chemotherapy will be her performance status. She has had a slow recovery from surgery. She has a colostomy in place.  This is a very challenging situation. Again, she is "mature" and is having some issues with recovery. She may be going to skilled nursing or home health.   In an ideal world, we will be offering adjuvant chemotherapy. Again, we will have to see how well she progresses.  We will follow her up after she gets out of the hospital.  Of note, she did have a proficient MMR.  I know that surgery did a fantastic job with her.  Lattie Haw, MD  [place] 14:14

## 2020-04-24 NOTE — Progress Notes (Addendum)
4 Days Post-Op  Subjective: CC: Patients family at bedside who helps translate and provide history. Patient without any abdominal pain when resting. She has had increased liquidy brown stool output and flatus overnight. Less burping/belching. No n/v. Eating more fld (grits and potatoe soup) but does not like the options.   Objective: Vital signs in last 24 hours: Temp:  [98.1 F (36.7 C)-98.8 F (37.1 C)] 98.2 F (36.8 C) (08/03 0409) Pulse Rate:  [87-107] 87 (08/03 0409) Resp:  [15-18] 15 (08/03 0409) BP: (129-156)/(56-72) 129/61 (08/03 0409) SpO2:  [95 %-97 %] 95 % (08/03 0409) Last BM Date: 04/24/20 (colonostomy)  Intake/Output from previous day: 08/02 0701 - 08/03 0700 In: 4685.2 [P.O.:650; I.V.:4035.2] Out: 325 [Urine:275; Stool:50] Intake/Output this shift: No intake/output data recorded.  PE: Gen:  Alert, NAD, pleasant Pulm: rate and effort normal Abd: Soft, mild distension, midline wound opened yesterday with infected hematoma present. There is noted blanchable erythema, with some heat and firmness that extends on the sides of midline wound b/l and across to the right flank. TTP over this area. Midline wound with healthy beefy red granulation tissue on the sides and the base. Colostomy bag in place with liquid brown stool and air in bag. +flatus and Bm while in the room. Stoma round, budded with some stoma necrosis at superior aspect.  Ext:  No LE edema  Skin: Other than as noted above, no rashes noted, warm and dry     Lab Results:  Recent Labs    04/23/20 0256 04/24/20 0144  WBC 11.4* 8.8  HGB 10.4* 10.2*  HCT 33.2* 32.1*  PLT 304 332   BMET Recent Labs    04/22/20 0334 04/23/20 0256  NA 133* 133*  K 2.8* 3.2*  CL 101 103  CO2 23 22  GLUCOSE 127* 93  BUN 5* 5*  CREATININE 0.52 0.50  CALCIUM 6.7* 6.8*   PT/INR No results for input(s): LABPROT, INR in the last 72 hours. CMP     Component Value Date/Time   NA 133 (L) 04/23/2020 0256   NA  139 07/12/2019 1110   K 3.2 (L) 04/23/2020 0256   CL 103 04/23/2020 0256   CO2 22 04/23/2020 0256   GLUCOSE 93 04/23/2020 0256   BUN 5 (L) 04/23/2020 0256   BUN 15 07/12/2019 1110   CREATININE 0.50 04/23/2020 0256   CREATININE 0.74 01/08/2017 1100   CALCIUM 6.8 (L) 04/23/2020 0256   PROT 5.5 (L) 04/23/2020 0256   PROT 7.1 02/11/2019 1455   ALBUMIN 2.6 (L) 04/23/2020 0256   ALBUMIN 4.4 02/11/2019 1455   AST 15 04/23/2020 0256   ALT 18 04/23/2020 0256   ALKPHOS 58 04/23/2020 0256   BILITOT 1.1 04/23/2020 0256   BILITOT <0.2 02/11/2019 1455   GFRNONAA >60 04/23/2020 0256   GFRNONAA 81 01/08/2017 1100   GFRAA >60 04/23/2020 0256   GFRAA >89 01/08/2017 1100   Lipase     Component Value Date/Time   LIPASE 19 04/16/2020 0042       Studies/Results: CT CHEST W CONTRAST  Result Date: 04/22/2020 CLINICAL DATA:  Staging for cancer of unknown primary. CT of abdomen and pelvis dated 04/10/2020 showed a constricting type mass of the sigmoid colon. EXAM: CT CHEST WITH CONTRAST TECHNIQUE: Multidetector CT imaging of the chest was performed during intravenous contrast administration. CONTRAST:  57mL OMNIPAQUE IOHEXOL 300 MG/ML  SOLN COMPARISON:  Abdomen and pelvis CT, 04/16/2020. CT neck dated 08/13/2017. FINDINGS: Cardiovascular: Heart is normal in size. Trace  pericardial fluid. Mild right and circumflex coronary artery calcifications. Great vessels are normal in caliber. Mild aortic atherosclerosis. Mediastinum/Nodes: Periphery calcified 1.2 cm left thyroid nodule. No follow-up recommended. No neck base, axillary, mediastinal or hilar masses or enlarged lymph nodes. Trachea and esophagus are unremarkable. Lungs/Pleura: Trace pleural effusions. Nodular opacity at the right anterior apex, measuring 1.3 x 1.1 x 1.3 cm, stable from the neck CT dated 08/13/2017, benign. No other lung nodules. Dependent opacity in the lower lobes and at the diaphragmatic bases of the right middle lobe and left upper  lobe lingula consistent with atelectasis. Remainder of the lungs is clear. Upper Abdomen: Small hiatal hernia. Trace ascites adjacent to the liver and spleen. 6 mm, partly imaged, enhancing lesion in the right lobe, segment 6, nonspecific and not visualized on the prior study likely due to differences in enhancement timing. Musculoskeletal: No fracture. No osteoblastic or osteolytic lesions. IMPRESSION: 1. No evidence of metastatic disease to the chest or of a primary malignancy. 2. 1.3 cm nodule at the apex of the right upper lobe, which has been stable since November 2018, benign. 3. No acute findings. 4. Trace pleural effusions and dependent lung atelectasis. No evidence of pneumonia or pulmonary edema. 5. Mild coronary artery calcifications and aortic atherosclerosis. Aortic Atherosclerosis (ICD10-I70.0). Electronically Signed   By: Lajean Manes M.D.   On: 04/22/2020 11:40    Anti-infectives: Anti-infectives (From admission, onward)   Start     Dose/Rate Route Frequency Ordered Stop   04/20/20 1200  cefOXitin (MEFOXIN) 2 g in sodium chloride 0.9 % 100 mL IVPB        2 g 200 mL/hr over 30 Minutes Intravenous Every 8 hours 04/20/20 1130 04/20/20 1245   04/19/20 1800  cefOXitin (MEFOXIN) 2 g in sodium chloride 0.9 % 100 mL IVPB  Status:  Discontinued        2 g 200 mL/hr over 30 Minutes Intravenous Every 8 hours 04/19/20 1659 04/20/20 1130   04/17/20 0000  cefTRIAXone (ROCEPHIN) 1 g in sodium chloride 0.9 % 100 mL IVPB        1 g 200 mL/hr over 30 Minutes Intravenous  Once 04/16/20 1604 04/16/20 2347   04/16/20 0115  cefTRIAXone (ROCEPHIN) 1 g in sodium chloride 0.9 % 100 mL IVPB        1 g 200 mL/hr over 30 Minutes Intravenous  Once 04/16/20 0113 04/16/20 0435       Assessment/Plan HTN T2DM Hypothyroidism ABL anemia - Hgb stable at 10.2 Hypokalemia - Repeat BMP Hypomagnesemia  - Per TRH -   Adenocarcinoma of the colon  -s/p sigmoid colectomy and end colostomy by Dr. Ninfa Linden on  7/30 - POD # 4 - Pre-op CEA mildly elevated at 14. Dr. Marin Olp following. CT chest without metastasis  - Midline wound opened with evacuation of hematoma. Start abx for cellulitis. BID WTD dressing changes.  - Adv to soft diet today  - Path with invasive adenocarcinoma with negative resection margins, metastatic carcinoma of 1/15 lymph notes and 1 satellite nodule  - Mobilize - IS  FEN: Soft  VTE: SCDs, lovenox ID: rocephin x1. Start Doxy. Afebrile. WBC normalized at 8.8 Foley - Out Follow-Up - Dr. Ninfa Linden   LOS: 8 days    Jillyn Ledger , [facility] 04/24/2020, 8:41 AM Please see Amion for pager number during day hours 7:00am-4:30pm

## 2020-04-24 NOTE — NC FL2 (Signed)
[place] LEVEL OF CARE SCREENING TOOL     IDENTIFICATION  Patient Name: Bailey Walker Birthdate: October 17, 1945 Sex: female Admission Date (Current Location): 04/15/2020  [place] and [place] Number:  [place] [id number] Facility and Address:  [facility] [place], [place] [address] [place], [place] [address]      Provider Number: [id number]  Attending Physician Name and Address:  Donne Hazel, MD  Relative Name and Phone Number:       Current Level of Care: Hospital Recommended Level of Care: [place] Prior Approval Number:    Date Approved/Denied:   PASRR Number: pending  Discharge Plan: SNF    Current Diagnoses: Patient Active Problem List   Diagnosis Date Noted  . Gastritis and gastroduodenitis   . Grade II internal hemorrhoids   . Abdominal distension   . Colonic mass   . Nausea and vomiting in adult   . Abnormal CT scan, gastrointestinal tract   . Colon obstruction ([place]) 04/16/2020  . UTI (urinary tract infection) 04/16/2020  . Thrombocytosis ([place]) 01/27/2020  . Dyspnea on exertion 01/27/2020  . Iron deficiency anemia 01/26/2020  . Vitamin B12 deficiency anemia 01/26/2020  . 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 Fuchs' corneal dystrophy 09/20/2018  . 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 Diabetes mellitus due to underlying condition with unspecified complications ([place]) 17/61/6073  . Ex-smoker 08/26/2017  . Atypical chest pain 08/26/2017  . Essential hypertension 01/11/2017  . Hypothyroidism 01/08/2017  . SVT (supraventricular tachycardia) ([place]) 01/08/2017    Orientation RESPIRATION BLADDER Height & Weight     Self, Time, Situation, Place  Normal Continent Weight: 160 lb 0.9 oz (72.6 kg) Height:  5\' 1"  (154.9 cm)  BEHAVIORAL SYMPTOMS/MOOD NEUROLOGICAL BOWEL NUTRITION STATUS       Continent, Colostomy Diet (see discharge summary)  AMBULATORY STATUS COMMUNICATION OF NEEDS Skin   Extensive Assist Verbally Surgical wounds, Other (Comment) (closed incision on abdomen; new colostomy; rash on breasts)                       Personal Care Assistance Level of Assistance  Bathing, Feeding, Dressing Bathing Assistance: Maximum assistance Feeding assistance: Independent Dressing Assistance: Maximum assistance     Functional Limitations Info  Hearing, Speech, Sight Sight Info: Adequate Hearing Info: Adequate Speech Info: Adequate (speaks Aramaic)    SPECIAL CARE FACTORS FREQUENCY  PT (By licensed PT), OT (By licensed OT)     PT Frequency: 5x week OT Frequency: 5x week            Contractures Contractures Info: Not present    Additional Factors Info  Code Status, Allergies Code Status Info: Full Code Allergies Info: Nitroglycerin, Ampicillin           Current Medications (04/24/2020):  This is the current hospital active medication list Current Facility-Administered Medications  Medication Dose Route Frequency Provider Last Rate Last Admin  . aspirin EC tablet 81 mg  81 mg Oral Daily Jill Alexanders, PA-C   81 mg at 04/24/20 0910  . Chlorhexidine Gluconate Cloth 2 % PADS 6 each  6 each Topical Daily Donne Hazel, MD   6 each at 04/24/20 0913  . dextrose 5 %-0.45 % sodium chloride infusion   Intravenous Continuous Jill Alexanders, PA-C 75 mL/hr at 04/23/20 2107 New Bag at 04/23/20 2107  . doxycycline (VIBRA-TABS)  tablet 100 mg  100 mg Oral Q12H Jillyn Ledger, PA-C   100 mg at 04/24/20 5997  . enoxaparin (LOVENOX) injection 40 mg  40 mg Subcutaneous Q24H Jill Alexanders, PA-C   40 mg at 04/23/20 1423  . feeding supplement (ENSURE ENLIVE) (ENSURE ENLIVE) liquid 237 mL  237 mL Oral BID BM Donne Hazel, MD   237 mL at 04/24/20 1422  . hydrALAZINE (APRESOLINE) injection 10 mg  10 mg Intravenous Q6H PRN Jill Alexanders, PA-C       . insulin aspart (novoLOG) injection 0-20 Units  0-20 Units Subcutaneous Q4H Jill Alexanders, PA-C   3 Units at 04/24/20 1205  . lactated ringers infusion   Intravenous Continuous Jill Alexanders, PA-C 20 mL/hr at 04/18/20 0940 Continued from Pre-op at 04/19/20 1000  . levothyroxine (SYNTHROID, LEVOTHROID) injection 37.5 mcg  37.5 mcg Intravenous Daily Jill Alexanders, PA-C   37.5 mcg at 04/24/20 7414  . lidocaine (LIDODERM) 5 % 1 patch  1 patch Transdermal Q24H Jill Alexanders, PA-C   1 patch at 04/24/20 1204  . LORazepam (ATIVAN) injection 1 mg  1 mg Intravenous Once PRN Simaan, Elizabeth S, PA-C      . methocarbamol (ROBAXIN) 500 mg in dextrose 5 % 50 mL IVPB  500 mg Intravenous Q8H Jillyn Ledger, PA-C 100 mL/hr at 04/24/20 1421 500 mg at 04/24/20 1421  . metoprolol tartrate (LOPRESSOR) injection 5 mg  5 mg Intravenous Q6H SimaanDarci Current, PA-C   5 mg at 04/24/20 1205  . morphine 2 MG/ML injection 2 mg  2 mg Intravenous Q3H PRN Maczis, Barth Kirks, PA-C      . multivitamin with minerals tablet 1 tablet  1 tablet Oral Daily Donne Hazel, MD   1 tablet at 04/24/20 1205  . ondansetron (ZOFRAN) tablet 4 mg  4 mg Oral Q6H PRN Jill Alexanders, PA-C       Or  . ondansetron [facility]) injection 4 mg  4 mg Intravenous Q6H PRN Jill Alexanders, PA-C   4 mg at 04/17/20 0051  . oxyCODONE (Oxy IR/ROXICODONE) immediate release tablet 5-10 mg  5-10 mg Oral Q4H PRN Jillyn Ledger, PA-C   10 mg at 04/24/20 0912  . pantoprazole (PROTONIX) injection 40 mg  40 mg Intravenous Q24H Jill Alexanders, PA-C   40 mg at 04/24/20 0910  . pneumococcal 23 valent vaccine (PNEUMOVAX-23) injection 0.5 mL  0.5 mL Intramuscular Tomorrow-1000 Donne Hazel, MD         Discharge Medications: Please see discharge summary for a list of discharge medications.  Relevant Imaging Results:  Relevant Lab Results:   Additional Information pt speaks Aramaic (not Arabic); no treatment planned  until incision on abdomen healted; new colostomy  Alexander Mt, LCSW

## 2020-04-24 NOTE — TOC Initial Note (Addendum)
Transition of Care [facility]) - Initial/Assessment Note    Patient Details  Name: Bailey Walker MRN: [id number] Date of Birth: 08-Jul-1946  Transition of Care [facility]) CM/SW Contact:    Marilu Favre, RN Phone Number: 04/24/2020, 1:28 PM  Clinical Narrative:                 Damaris Schooner to niece Marcelo Baldy at bedside and friend Elder Negus via speaker phone. Elder Negus lives in [place].   Elder Negus requesting home health RN , aide, hospital bed, air mattress and bedside commode.   Patient plans to stay with [name] at discharge. Mary's address is [place] [address].   Explained to [place] and [place] , NCM calling multiple agencies and so far unable to arrange [facility] and aide due to staffing or agency not in network with medicaid.   NCM explained family will need to learn wound care and ostomy care. Even if [facility] is arranged HHRN will not be able to visit daily and wound care is BID.   NCM explained , will request a PT evaluation , to see what DME PT would recommend for home. Once NCM has recommendations and MD order , NCM will order DME through [facility]. [name] is contact person for delivery. [employer] will discuss directly with [facility] what insurance will cover and what patient will be responsible for.   Elder Negus and [place] voiced understanding. NCM still calling home health agencies.    [facility] Lemmings unable to accept referral due to staffing.  Tiffany with [facility] checking to see if she can accept referral. Tiffany unable to accept referral.  Hoyle Sauer with [facility] unable to accept due to staffing.  Drew with [facility] unable to accept due to staffing.  Cassie with [facility] unable to accept due to insurance.   Cheryl with [facility] unable to accept due to staffing.  Santiago Glad with [facility] unable to accept .. no reason given   Sarah with [facility] checking to see if she can accept referral. Sarah returned call and due to staffing they are unable to accept  referral at this time.   [place] with [facility] unable to accept patient due to staffing   Bobby with [facility] unable to accept due to staffing   [facility] unable to accept referral at this time due to staffing.   Samantha at [facility] unable to accept referral, they are private pay only.   Explained above to patient's niece [name]. Mary asking about rehab . NCM checked MD has placed an order for PT. Explained it may be tomorrow before PT does evaluation.   Explained to [facility] that NCM has asked bedside nurse to start teaching. [name] stated she is unable to provide wound care at home , and stated she will find help . Mary told NCM that NCM is no help and to leave.   Called patient's PCP left message for them to call [facility] to schedule follow up appointment.   Have asked bedside nurse to start teaching wound and ostomy care   Expected Discharge Plan: [place]     Patient Goals and CMS Choice        Expected Discharge Plan and Services Expected Discharge Plan: [place]   Discharge Planning Services: CM Consult Post Acute Care Choice: Home Health, Durable Medical Equipment Living arrangements for the past 2 months: [place]  Prior Living Arrangements/Services Living arrangements for the past 2 months: Single Family Home Lives with:: Self                   Activities of Daily Living Home Assistive Devices/Equipment: None ADL Screening (condition at time of admission) Patient's cognitive ability adequate to safely complete daily activities?: Yes Is the patient deaf or have difficulty hearing?: No Does the patient have difficulty seeing, even when wearing glasses/contacts?: No Does the patient have difficulty concentrating, remembering, or making decisions?: No Patient able to express need for assistance with ADLs?: Yes Does the patient have difficulty dressing or  bathing?: No Independently performs ADLs?: Yes (appropriate for developmental age) Does the patient have difficulty walking or climbing stairs?: Yes Weakness of Legs: None Weakness of Arms/Hands: None  Permission Sought/Granted                  Emotional Assessment              Admission diagnosis:  Dehydration [E86.0] Abdominal distension [R14.0] Colon obstruction (HCC) [K56.609] Generalized abdominal pain [R10.84] Acute UTI [N39.0] Nausea and vomiting in adult [R11.2] Colonic mass [K63.89] Patient Active Problem List   Diagnosis Date Noted  . Gastritis and gastroduodenitis   . Grade II internal hemorrhoids   . Abdominal distension   . Colonic mass   . Nausea and vomiting in adult   . Abnormal CT scan, gastrointestinal tract   . Colon obstruction ([place]) 04/16/2020  . UTI (urinary tract infection) 04/16/2020  . Thrombocytosis ([place]) 01/27/2020  . Dyspnea on exertion 01/27/2020  . Iron deficiency anemia 01/26/2020  . Vitamin B12 deficiency anemia 01/26/2020  . 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 Fuchs' corneal dystrophy 09/20/2018  . 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 Diabetes mellitus due to underlying condition with unspecified complications ([place]) 91/50/5697  . Ex-smoker 08/26/2017  . Atypical chest pain 08/26/2017  . Essential hypertension 01/11/2017  . Hypothyroidism 01/08/2017  . SVT (supraventricular tachycardia) ([place]) 01/08/2017   PCP:  [name], MD Pharmacy:   [id number] - [address] [place] [address] Phone: [phone] Fax: [phone]     Social Determinants of Health (SDOH) Interventions    Readmission Risk Interventions No flowsheet data found.

## 2020-04-24 NOTE — Progress Notes (Signed)
PROGRESS NOTE    Bailey Walker  VEH:[id number] DOB: 1945/11/15 DOA: 04/15/2020 PCP: [name], MD    Brief Narrative:  74 y.o. female with medical history significant of hypertension, type 2 diabetes mellitus, hypothyroidism, obesity presents to emergency department with abdominal pain and vomiting since 2 days.  Patient speaks Arabic.  History gathered from patient's daughter at the bedside.  She mentioned that patient has severe generalized abdominal pain and vomiting since July 4 however it got worse since 2 days.  She could not keep anything down, she has 10 out of 10, nonradiating, sharp pain, no aggravating or relieving factors, associated with vomiting 4 times since yesterday.  Vomitus is nonbloody.  She has lost about 7 to 10 pounds since July for the due to abdominal pain and vomiting.  No history of colon cancer in the family.  No night sweats, melena, fever, chills, headache, blurry vision, chest pain, shortness of breath, palpitation, leg swelling.  Reports dysuria since few days.  No foul-smelling or change in urinary frequency, lower back pain.  She had regular bowel movement this morning.  She is a former smoker.  No history of alcohol, illicit drug use.  ED Course: Upon arrival to ED: Patient's vital signs stable.  She received IV fluid bolus, Protonix, Zofran, Dilaudid as needed for pain control and Rocephin for possible UTI in ED.  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  Patient transferred to [facility] for further evaluation and management.  Assessment & Plan:   Principal Problem:   Colon obstruction ([place]) Active Problems:   Hypothyroidism   Essential hypertension   Diabetes mellitus due to underlying condition with unspecified complications ([place])   UTI (urinary tract infection)   Abdominal distension   Colonic mass   Nausea and vomiting in adult   Abnormal CT scan, gastrointestinal tract    Gastritis and gastroduodenitis   Grade II internal hemorrhoids  Abdominal pain with vomiting secondary to obstructing colonic mass: -Concern for possible underlying obstructive colorectal carcinoma. -Patient's vital signs remain stable.  She is afebrile with no leukocytosis.  COVID-19 negative.  Lipase: WNL. -continue IVF hydration for now -Appreciate assistance by GI.  Gastritis noted on EGD. Flex sig now confirming obstructing colonic mass -Appreciate General Surgery assistance.  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 Empiric doxycycline started 8/3 by general surgery -Of note, CEA is elevated at 14.1 -CT chest without evidence of metastatic disease noted -Oncology following, likely plan for chemo once surgical incision heals  UTI: Patient reports dysuria.  UA is positive for leukocyte/bacteria/WBC -She received Rocephin in ED.  Completed empiric course of rocephin.  Urine culture results are indeterminate. Stable at this time  Hypertension:  -metoprolol initially held given soft BP at time of admit -BP now improved on beta blocker. -Pt tolerating diet, thus will transition back to home PO metoprolol   Hypothyroidism: Check TSH in AM -Continue levothyroxine, as pt is tolerating diet, will transition to PO  Type 2 diabetes mellitus: Hold glipizide and Metformin.  Started patient on sliding scale insulin and monitor blood sugar closely Per diabetic coordinator, glycemic trends stable  Iron deficiency anemia: H&H is stable at this time -Continued on ferrous sulfate -Followed by hematology as outpatient Dr. Alvy Bimler -Continue to follow cbc trends  DVT prophylaxis: Lovenox subq Code Status: Full Family Communication: Pt in room, family at bedside  Status is: Inpatient  Remains inpatient  appropriate because:Ongoing diagnostic testing needed not appropriate for outpatient  work up   Dispo: The patient is from: Home              Anticipated d/c is to: Home with [facility]              Anticipated d/c date is: 2 days              Patient currently is not medically stable to d/c. Pending Surgery clearance   Consultants:   GI  General Surgery  Oncology  Procedures:   EGD, flex sig 7/28  Sigmoid colectomy with end colostomy 04/20/2020  Antimicrobials: Anti-infectives (From admission, onward)   Start     Dose/Rate Route Frequency Ordered Stop   04/24/20 0915  doxycycline (VIBRA-TABS) tablet 100 mg     Discontinue     100 mg Oral Every 12 hours 04/24/20 0850     04/20/20 1200  cefOXitin (MEFOXIN) 2 g in sodium chloride 0.9 % 100 mL IVPB        2 g 200 mL/hr over 30 Minutes Intravenous Every 8 hours 04/20/20 1130 04/20/20 1245   04/19/20 1800  cefOXitin (MEFOXIN) 2 g in sodium chloride 0.9 % 100 mL IVPB  Status:  Discontinued        2 g 200 mL/hr over 30 Minutes Intravenous Every 8 hours 04/19/20 1659 04/20/20 1130   04/17/20 0000  cefTRIAXone (ROCEPHIN) 1 g in sodium chloride 0.9 % 100 mL IVPB        1 g 200 mL/hr over 30 Minutes Intravenous  Once 04/16/20 1604 04/16/20 2347   04/16/20 0115  cefTRIAXone (ROCEPHIN) 1 g in sodium chloride 0.9 % 100 mL IVPB        1 g 200 mL/hr over 30 Minutes Intravenous  Once 04/16/20 0113 04/16/20 0435      Subjective: Without complaints this AM  Objective: Vitals:   04/23/20 1613 04/23/20 2024 04/24/20 0409 04/24/20 1500  BP: (!) 156/72 (!) 135/56 129/61 (!) 130/59  Pulse: (!) 107 93 87 86  Resp: 18 18 15 17   Temp: 98.1 F (36.7 C) 98.8 F (37.1 C) 98.2 F (36.8 C) 98 F (36.7 C)  TempSrc:  Oral Oral Oral  SpO2: 97% 96% 95% 97%  Weight:      Height:        Intake/Output Summary (Last 24 hours) at 04/24/2020 1602 Last data filed at 04/24/2020 1400 Gross per 24 hour  Intake 4815.23 ml  Output 325 ml  Net 4490.23 ml   Filed Weights   04/15/20 1956 04/16/20 1805 04/19/20 0840  Weight: 72.9 kg 72.6 kg  72.6 kg    Examination: General exam: Conversant, in no acute distress Respiratory system: normal chest rise, clear, no audible wheezing Cardiovascular system: regular rhythm, s1-s2 Gastrointestinal system: Nondistended, nontender, pos BS Central nervous system: No seizures, no tremors Extremities: No cyanosis, no joint deformities Skin: No rashes, no pallor Psychiatry: Affect normal // no auditory hallucinations   Data Reviewed: I have personally reviewed following labs and imaging studies  CBC: Recent Labs  Lab 04/20/20 0057 04/20/20 0057 04/20/20 0837 04/21/20 0249 04/22/20 0334 04/23/20 0256 04/24/20 0144  WBC 4.2  --   --  9.6 13.7* 11.4* 8.8  NEUTROABS 2.0  --   --  7.4 10.4* 8.3* 6.2  HGB 10.5*   < > 12.6 12.5 11.3* 10.4* 10.2*  HCT 33.6*   < > 37.0 39.0 35.3* 33.2* 32.1*  MCV 89.8  --   --  88.8 89.1 90.5 89.7  PLT 241  --   --  317 339 304 332   < > = values in this interval not displayed.   Basic Metabolic Panel: Recent Labs  Lab 04/20/20 0057 04/20/20 0057 04/20/20 0837 04/20/20 0837 04/20/20 1158 04/21/20 0249 04/22/20 0334 04/23/20 0256 04/23/20 2003 04/24/20 0144  NA 136   < > 140  --   --  135 133* 133*  --  133*  K 2.7*   < > 4.2   < > 3.4* 3.0* 2.8* 3.2*  --  3.0*  CL 102   < > 102  --   --  102 101 103  --  102  CO2 23  --   --   --   --  23 23 22   --  22  GLUCOSE 103*   < > 115*  --   --  168* 127* 93  --  113*  BUN <5*   < > <3*  --   --  6* 5* 5*  --  <5*  CREATININE 0.46   < > 0.40*  --   --  0.52 0.52 0.50  --  0.49  CALCIUM 7.5*  --   --   --   --  7.2* 6.7* 6.8*  --  6.7*  MG 1.9  --   --   --   --  1.9 1.8 1.7  --  2.1  PHOS 1.2*   < >  --   --   --  2.6 1.4* 1.3* 3.3 2.0*   < > = values in this interval not displayed.   GFR: Estimated Creatinine Clearance: 54.5 mL/min (by C-G formula based on SCr of 0.49 mg/dL). Liver Function Tests: Recent Labs  Lab 04/19/20 0134 04/20/20 0057 04/21/20 0249 04/22/20 0334 04/23/20 0256    AST 11* 13* 19 22 15   ALT 10 8 14 26 18   ALKPHOS 40 47 41 49 58  BILITOT 0.7 0.7 0.5 0.6 1.1  PROT 5.8* 5.9* 5.8* 5.3* 5.5*  ALBUMIN 3.2* 3.2* 2.9* 2.7* 2.6*   No results for input(s): LIPASE, AMYLASE in the last 168 hours. No results for input(s): AMMONIA in the last 168 hours. Coagulation Profile: No results for input(s): INR, PROTIME in the last 168 hours. Cardiac Enzymes: No results for input(s): CKTOTAL, CKMB, CKMBINDEX, TROPONINI in the last 168 hours. BNP (last 3 results) No results for input(s): PROBNP in the last 8760 hours. HbA1C: No results for input(s): HGBA1C in the last 72 hours. CBG: Recent Labs  Lab 04/23/20 2025 04/24/20 0009 04/24/20 0412 04/24/20 0739 04/24/20 1151  GLUCAP 148* 125*  125* 101*  101* 139*  139* 128*   Lipid Profile: No results for input(s): CHOL, HDL, LDLCALC, TRIG, CHOLHDL, LDLDIRECT in the last 72 hours. Thyroid Function Tests: No results for input(s): TSH, T4TOTAL, FREET4, T3FREE, THYROIDAB in the last 72 hours. Anemia Panel: No results for input(s): VITAMINB12, FOLATE, FERRITIN, TIBC, IRON, RETICCTPCT in the last 72 hours. Sepsis Labs: No results for input(s): PROCALCITON, LATICACIDVEN in the last 168 hours.  Recent Results (from the past 240 hour(s))  Urine Culture     Status: Abnormal   Collection Time: 04/16/20  1:13 AM   Specimen: Urine, Random  Result Value Ref Range Status   Specimen Description   Final    URINE, RANDOM Performed at [facility], [address] [place], [place] [address]    Special Requests   Final  NONE Performed at [facility], [address] [place], [place] [address]    Culture MULTIPLE SPECIES PRESENT, SUGGEST RECOLLECTION (A)  Final   Report Status 04/17/2020 FINAL  Final  SARS Coronavirus 2 by RT PCR (hospital order, performed in [facility] hospital lab) Nasopharyngeal Nasopharyngeal Swab     Status: None   Collection Time: 04/16/20  4:27 AM   Specimen:  Nasopharyngeal Swab  Result Value Ref Range Status   SARS Coronavirus 2 NEGATIVE NEGATIVE Final    Comment: (NOTE) SARS-CoV-2 target nucleic acids are NOT DETECTED.  The SARS-CoV-2 RNA is generally detectable in upper and lower respiratory specimens during the acute phase of infection. The lowest concentration of SARS-CoV-2 viral copies this assay can detect is 250 copies / mL. A negative result does not preclude SARS-CoV-2 infection and should not be used as the sole basis for treatment or other patient management decisions.  A negative result may occur with improper specimen collection / handling, submission of specimen other than nasopharyngeal swab, presence of viral mutation(s) within the areas targeted by this assay, and inadequate number of viral copies (<250 copies / mL). A negative result must be combined with clinical observations, patient history, and epidemiological information.  Fact Sheet for Patients:   StrictlyIdeas.no  Fact Sheet for Healthcare Providers: BankingDealers.co.za  This test is not yet approved or  cleared by the [place] FDA and has been authorized for detection and/or diagnosis of SARS-CoV-2 by FDA under an Emergency Use Authorization (EUA).  This EUA will remain in effect (meaning this test can be used) for the duration of the COVID-19 declaration under Section 564(b)(1) of the Act, 21 U.S.C. section 360bbb-3(b)(1), unless the authorization is terminated or revoked sooner.  Performed at [facility], [address] [place], [place] [address]      Radiology Studies: No results found.  Scheduled Meds: . aspirin EC  81 mg Oral Daily  . Chlorhexidine Gluconate Cloth  6 each Topical Daily  . doxycycline  100 mg Oral Q12H  . enoxaparin (LOVENOX) injection  40 mg Subcutaneous Q24H  . feeding supplement (ENSURE ENLIVE)  237 mL Oral BID BM  . insulin aspart  0-20 Units Subcutaneous Q4H    . levothyroxine  37.5 mcg Intravenous Daily  . lidocaine  1 patch Transdermal Q24H  . metoprolol tartrate  5 mg Intravenous Q6H  . multivitamin with minerals  1 tablet Oral Daily  . pantoprazole (PROTONIX) IV  40 mg Intravenous Q24H  . pneumococcal 23 valent vaccine  0.5 mL Intramuscular Tomorrow-1000   Continuous Infusions: . dextrose 5 % and 0.45% NaCl 75 mL/hr at 04/23/20 2107  . lactated ringers 20 mL/hr at 04/18/20 0940  . methocarbamol (ROBAXIN) IV 500 mg (04/24/20 1421)     LOS: 8 days   Marylu Lund, MD [facility] Pager On Amion  If 7PM-7AM, please contact night-coverage 04/24/2020, 4:02 PM

## 2020-04-25 LAB — CBC WITH DIFFERENTIAL/PLATELET
Abs Immature Granulocytes: 0.13 10*3/uL — ABNORMAL HIGH (ref 0.00–0.07)
Basophils Absolute: 0 10*3/uL (ref 0.0–0.1)
Basophils Relative: 0 %
Eosinophils Absolute: 0.1 10*3/uL (ref 0.0–0.5)
Eosinophils Relative: 1 %
HCT: 30.4 % — ABNORMAL LOW (ref 36.0–46.0)
Hemoglobin: 9.5 g/dL — ABNORMAL LOW (ref 12.0–15.0)
Immature Granulocytes: 1 %
Lymphocytes Relative: 14 %
Lymphs Abs: 1.5 10*3/uL (ref 0.7–4.0)
MCH: 27.8 pg (ref 26.0–34.0)
MCHC: 31.3 g/dL (ref 30.0–36.0)
MCV: 88.9 fL (ref 80.0–100.0)
Monocytes Absolute: 1 10*3/uL (ref 0.1–1.0)
Monocytes Relative: 10 %
Neutro Abs: 7.8 10*3/uL — ABNORMAL HIGH (ref 1.7–7.7)
Neutrophils Relative %: 74 %
Platelets: 315 10*3/uL (ref 150–400)
RBC: 3.42 MIL/uL — ABNORMAL LOW (ref 3.87–5.11)
RDW: 15.5 % (ref 11.5–15.5)
WBC: 10.6 10*3/uL — ABNORMAL HIGH (ref 4.0–10.5)
nRBC: 0 % (ref 0.0–0.2)

## 2020-04-25 LAB — GLUCOSE, CAPILLARY
Glucose-Capillary: 100 mg/dL — ABNORMAL HIGH (ref 70–99)
Glucose-Capillary: 125 mg/dL — ABNORMAL HIGH (ref 70–99)
Glucose-Capillary: 130 mg/dL — ABNORMAL HIGH (ref 70–99)
Glucose-Capillary: 145 mg/dL — ABNORMAL HIGH (ref 70–99)
Glucose-Capillary: 217 mg/dL — ABNORMAL HIGH (ref 70–99)
Glucose-Capillary: 95 mg/dL (ref 70–99)

## 2020-04-25 LAB — BASIC METABOLIC PANEL
Anion gap: 12 (ref 5–15)
BUN: <5 mg/dL — ABNORMAL LOW (ref 8–23)
CO2: 21 mmol/L — ABNORMAL LOW (ref 22–32)
Calcium: 6.6 mg/dL — ABNORMAL LOW (ref 8.9–10.3)
Chloride: 101 mmol/L (ref 98–111)
Creatinine, Ser: 0.49 mg/dL (ref 0.44–1.00)
GFR calc Af Amer: >60 mL/min (ref >60)
GFR calc non Af Amer: >60 mL/min (ref >60)
Glucose, Bld: 115 mg/dL — ABNORMAL HIGH (ref 70–99)
Potassium: 3 mmol/L — ABNORMAL LOW (ref 3.5–5.1)
Sodium: 134 mmol/L — ABNORMAL LOW (ref 135–145)

## 2020-04-25 LAB — MAGNESIUM: Magnesium: 2 mg/dL (ref 1.7–2.4)

## 2020-04-25 LAB — TSH: TSH: 15.312 u[IU]/mL — ABNORMAL HIGH (ref 0.350–4.500)

## 2020-04-25 LAB — PHOSPHORUS: Phosphorus: 1.3 mg/dL — ABNORMAL LOW (ref 2.5–4.6)

## 2020-04-25 MED ORDER — MELATONIN 3 MG PO TABS
6.0000 mg | ORAL_TABLET | Freq: Every evening | ORAL | Status: DC | PRN
  Administered 2020-04-25 – 2020-04-26 (×2): 6 mg via ORAL
  Filled 2020-04-25 (×2): qty 2

## 2020-04-25 MED ORDER — SODIUM CHLORIDE 0.9 % IV SOLN
1.0000 g | Freq: Three times a day (TID) | INTRAVENOUS | Status: AC
  Administered 2020-04-25 – 2020-05-02 (×21): 1 g via INTRAVENOUS
  Filled 2020-04-25 (×22): qty 1

## 2020-04-25 NOTE — Progress Notes (Signed)
TCT patient's Niece [name] concerning the difficulty obtaining a [place] agency due to the [facility] shortage- the agencies do not have the staff to take care of her needs. [name] talked about hiring a private duty RN - information given to her about that. Also discussed about a safe discharge. If HHC cannot be arranged, pt needs skilled care at a SNF. [name] will discuss this with her sister and talk more to Almedia Balls in the am.  [place] Supervisor [phone]

## 2020-04-25 NOTE — Progress Notes (Addendum)
PROGRESS NOTE    Bailey Walker  DXA:[id number] DOB: 1946/05/20 DOA: 04/15/2020 PCP: [name], MD    Brief Narrative:  74 y.o.femalewith medical history significant ofhypertension, type 2 diabetes mellitus, hypothyroidism, obesity presents to emergency department with abdominal pain and vomiting since 2 days.  Patient speaks Arabic. History gathered from patient's daughter at the bedside. She mentioned that patient has severe generalized abdominal pain and vomiting since July 4 however it got worse since 2 days. She could not keep anything down, she has 10 out of 10, nonradiating, sharp pain, no aggravating or relieving factors, associated with vomiting 4 times since yesterday. Vomitus is nonbloody. She has lost about 7 to 10 pounds since July for the due to abdominal pain and vomiting. No history of colon cancer in the family. No night sweats, melena, fever, chills, headache, blurry vision, chest pain, shortness of breath, palpitation, leg swelling.  Reports dysuria since few days. No foul-smelling or change in urinary frequency, lower back pain. She had regular bowel movement this morning.  She is a former smoker. No history of alcohol, illicit drug use.  ED Course:Upon arrival to ED: Patient's vital signs stable. She received IV fluid bolus, Protonix, Zofran, Dilaudid as needed for pain control and Rocephin for possible UTI in ED. 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 Patient transferred to [facility] for further evaluation and management.    Consultants:   Surgery  Procedures:   Antimicrobials:   Meropenem   Subjective: Pt does not speak english. Daughter came back and did not report much. Pt ambulated today.   Objective: Vitals:   04/24/20 1500 04/24/20 2008 04/25/20 0359 04/25/20 1403  BP: (!) 130/59 133/60 (!) 130/53 (!) 115/59  Pulse: 86 89 78 79  Resp: 17 17 15 16   Temp: 98 F  (36.7 C) 98.6 F (37 C) 98.4 F (36.9 C) 98.7 F (37.1 C)  TempSrc: Oral Oral Oral Oral  SpO2: 97% 97% 96% 98%  Weight:      Height:        Intake/Output Summary (Last 24 hours) at 04/25/2020 1800 Last data filed at 04/25/2020 0900 Gross per 24 hour  Intake 1000 ml  Output --  Net 1000 ml   Filed Weights   04/15/20 1956 04/16/20 1805 04/19/20 0840  Weight: 72.9 kg 72.6 kg 72.6 kg    Examination:  General exam: Appears calm and comfortable  Respiratory system: Clear to auscultation. Respiratory effort normal. Cardiovascular system: S1 & S2 heard, RRR. No JVD, murmurs, rubs, gallops or clicks. Gastrointestinal system: Abdomen dressing in place.  Bilateral lower abdomen erythema and warmth. Normal bowel sounds heard. Central nervous system: Awake and alert grossly intact Extremities: No edema Skin: Warm and dry Psychiatry: Judgement and insight appear normal. Mood & affect appropriate.     Data Reviewed: I have personally reviewed following labs and imaging studies  CBC: Recent Labs  Lab 04/21/20 0249 04/22/20 0334 04/23/20 0256 04/24/20 0144 04/25/20 0219  WBC 9.6 13.7* 11.4* 8.8 10.6*  NEUTROABS 7.4 10.4* 8.3* 6.2 7.8*  HGB 12.5 11.3* 10.4* 10.2* 9.5*  HCT 39.0 35.3* 33.2* 32.1* 30.4*  MCV 88.8 89.1 90.5 89.7 88.9  PLT 317 339 304 332 209   Basic Metabolic Panel: Recent Labs  Lab 04/21/20 0249 04/21/20 0249 04/22/20 0334 04/23/20 0256 04/23/20 2003 04/24/20 0144 04/25/20 0219  NA 135  --  133* 133*  --  133* 134*  K 3.0*  --  2.8* 3.2*  --  3.0* 3.0*  CL 102  --  101 103  --  102 101  CO2 23  --  23 22  --  22 21*  GLUCOSE 168*  --  127* 93  --  113* 115*  BUN 6*  --  5* 5*  --  <5* <5*  CREATININE 0.52  --  0.52 0.50  --  0.49 0.49  CALCIUM 7.2*  --  6.7* 6.8*  --  6.7* 6.6*  MG 1.9  --  1.8 1.7  --  2.1 2.0  PHOS 2.6   < > 1.4* 1.3* 3.3 2.0* 1.3*   < > = values in this interval not displayed.   GFR: Estimated Creatinine Clearance: 54.5 mL/min  (by C-G formula based on SCr of 0.49 mg/dL). Liver Function Tests: Recent Labs  Lab 04/19/20 0134 04/20/20 0057 04/21/20 0249 04/22/20 0334 04/23/20 0256  AST 11* 13* 19 22 15   ALT 10 8 14 26 18   ALKPHOS 40 47 41 49 58  BILITOT 0.7 0.7 0.5 0.6 1.1  PROT 5.8* 5.9* 5.8* 5.3* 5.5*  ALBUMIN 3.2* 3.2* 2.9* 2.7* 2.6*   No results for input(s): LIPASE, AMYLASE in the last 168 hours. No results for input(s): AMMONIA in the last 168 hours. Coagulation Profile: No results for input(s): INR, PROTIME in the last 168 hours. Cardiac Enzymes: No results for input(s): CKTOTAL, CKMB, CKMBINDEX, TROPONINI in the last 168 hours. BNP (last 3 results) No results for input(s): PROBNP in the last 8760 hours. HbA1C: No results for input(s): HGBA1C in the last 72 hours. CBG: Recent Labs  Lab 04/25/20 0007 04/25/20 0400 04/25/20 0814 04/25/20 1235 04/25/20 1713  GLUCAP 95 130* 125* 217* 145*   Lipid Profile: No results for input(s): CHOL, HDL, LDLCALC, TRIG, CHOLHDL, LDLDIRECT in the last 72 hours. Thyroid Function Tests: Recent Labs    04/25/20 0219  TSH 15.312*   Anemia Panel: No results for input(s): VITAMINB12, FOLATE, FERRITIN, TIBC, IRON, RETICCTPCT in the last 72 hours. Sepsis Labs: No results for input(s): PROCALCITON, LATICACIDVEN in the last 168 hours.  Recent Results (from the past 240 hour(s))  Urine Culture     Status: Abnormal   Collection Time: 04/16/20  1:13 AM   Specimen: Urine, Random  Result Value Ref Range Status   Specimen Description   Final    URINE, RANDOM Performed at [facility], [address] [place], [place] [address]    Special Requests   Final    NONE Performed at [facility], [address] [place], [place] [address]    Culture MULTIPLE SPECIES PRESENT, SUGGEST RECOLLECTION (A)  Final   Report Status 04/17/2020 FINAL  Final  SARS Coronavirus 2 by RT PCR (hospital order, performed in [facility] hospital lab)  Nasopharyngeal Nasopharyngeal Swab     Status: None   Collection Time: 04/16/20  4:27 AM   Specimen: Nasopharyngeal Swab  Result Value Ref Range Status   SARS Coronavirus 2 NEGATIVE NEGATIVE Final    Comment: (NOTE) SARS-CoV-2 target nucleic acids are NOT DETECTED.  The SARS-CoV-2 RNA is generally detectable in upper and lower respiratory specimens during the acute phase of infection. The lowest concentration of SARS-CoV-2 viral copies this assay can detect is 250 copies / mL. A negative result does not preclude SARS-CoV-2 infection and should not be used as the sole basis for treatment or other patient management decisions.  A negative result may occur with improper specimen collection / handling, submission of  specimen other than nasopharyngeal swab, presence of viral mutation(s) within the areas targeted by this assay, and inadequate number of viral copies (<250 copies / mL). A negative result must be combined with clinical observations, patient history, and epidemiological information.  Fact Sheet for Patients:   StrictlyIdeas.no  Fact Sheet for Healthcare Providers: BankingDealers.co.za  This test is not yet approved or  cleared by the [place] FDA and has been authorized for detection and/or diagnosis of SARS-CoV-2 by FDA under an Emergency Use Authorization (EUA).  This EUA will remain in effect (meaning this test can be used) for the duration of the COVID-19 declaration under Section 564(b)(1) of the Act, 21 U.S.C. section 360bbb-3(b)(1), unless the authorization is terminated or revoked sooner.  Performed at [facility], [address], [place], [place] [address]          Radiology Studies: No results found.      Scheduled Meds: . aspirin EC  81 mg Oral Daily  . Chlorhexidine Gluconate Cloth  6 each Topical Daily  . enoxaparin (LOVENOX) injection  40 mg Subcutaneous Q24H  . feeding supplement  (ENSURE ENLIVE)  237 mL Oral BID BM  . insulin aspart  0-20 Units Subcutaneous Q4H  . levothyroxine  75 mcg Oral QAC breakfast  . lidocaine  1 patch Transdermal Q24H  . metoprolol tartrate  25 mg Oral BID  . multivitamin with minerals  1 tablet Oral Daily  . pantoprazole  40 mg Oral Daily  . pneumococcal 23 valent vaccine  0.5 mL Intramuscular Tomorrow-1000   Continuous Infusions: . dextrose 5 % and 0.45% NaCl 75 mL/hr at 04/25/20 0553  . lactated ringers 20 mL/hr at 04/18/20 0940  . meropenem (MERREM) IV 1 g (04/25/20 1414)  . methocarbamol (ROBAXIN) IV 500 mg (04/25/20 1625)    Assessment & Plan:   Principal Problem:   Colon obstruction ([place]) Active Problems:   Hypothyroidism   Essential hypertension   Diabetes mellitus due to underlying condition with unspecified complications (HCC)   UTI (urinary tract infection)   Abdominal distension   Colonic mass   Nausea and vomiting in adult   Abnormal CT scan, gastrointestinal tract   Gastritis and gastroduodenitis   Grade II internal hemorrhoids   Abdominal pain with vomiting secondary to obstructing colonic mass: -Concern for possible underlying obstructive colorectal carcinoma. -continue IVF hydration for now -Appreciate assistance by GI.  Gastritis noted on EGD. Flex sig now confirming obstructing colonic mass -Appreciate General Surgery assistance.s/p sigmoid colectomy and end colostomy by Dr. 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 Pre-opCEA mildly elevated at 14. Dr. Marin Olp following. CT chest without metastasis. Med Onc saw 8/3 and will follow -Advancing diet per general surgery. Empiric doxycycline started 8/3 by general surgery----Midline wound opened with evacuation of hematoma 8/2. BID WTD dressing changes.Not a good candidate for vac with tunneling of wound.  Switched to IV Meropenem  may need CT in 1-2 days.  -CT chest  without evidence of metastatic disease noted -Oncology following, likely plan for chemo once surgical incision heals  UTI-patient reported dysuria. UA is positive for leukocyte/bacteria/WBC -She received Rocephin in ED. Completed empiric course of rocephin.  Urine culture results are indeterminate.  Currently asymptomatic we will continue to monitor  Hypertension: -metoprolol initially held given soft BP at time of admit -BP now improved on beta blocker. Continue home metoprolol   Hypothyroidism:  TSH elevated at 15.312  Will ck Ft4  Continue levothyroxine.   Type 2 diabetes mellitus: Hold glipizide and Metformin. Continue RISS, ck FS   Iron deficiency anemia: H&H is stable at this time -Continue ferrous sulfate  -Followed by hematology as outpatient Dr. Alvy Bimler Monitor CBC  DVT prophylaxis: Lovenox subq Code Status: Full Family Communication:  Daughter updated at bedside  Status is: Inpatient  Remains inpatient appropriate because:IV treatments appropriate due to intensity of illness or inability to take PO   Dispo: The patient is from: Home              Anticipated d/c is to: Home versus SNF              Anticipated d/c date is: 2 days              Patient currently is not medically stable to d/c.  Continue needing IV antibiotics needs to be cleared from surgical standpoint and cellulitis improving prior to discharge            LOS: 9 days   Time spent: 35 minutes with more than 50% on [place], MD [facility] Pager [phone]  If 7PM-7AM, please contact night-coverage [email or web address] Password [email or web address] 04/25/2020, 6:00 PM

## 2020-04-25 NOTE — Progress Notes (Signed)
Pharmacy Antibiotic Note  Bailey Walker is a 74 y.o. female admitted on 04/15/2020 with abdominal pain found to have obstructing colorectal carcinoma now s/p sigmoid colectomy with end colostomy. Cellulitis initially diagnosed, but today, wound infection suspected, and pharmacy has been consulted for meropenem dosing.  Patient's WBC up to 10.6, afebrile.  Patient has listed allergy to ampicillin. Upon conversation with patient via interpreter, patient describes allergic reaction as severe rash that occurred in the 1970s. Patient unable to remember more details, but stated that she was already in the hospital when the reaction occurred, and she had to get "a shot" to treat her rash because it was so bad. This admission, patient has received cephalosporins with no reaction noted.  Plan: Start meropenem 1g IV q8h D/c doxycycline Monitor for allergic reaction, clinical picture, renal function F/U C&S, abx de-escalation, LOT   Height: 5\' 1"  (154.9 cm) Weight: 72.6 kg (160 lb 0.9 oz) IBW/kg (Calculated) : 47.8  Temp (24hrs), Avg:98.3 F (36.8 C), Min:98 F (36.7 C), Max:98.6 F (37 C)  Recent Labs  Lab 04/21/20 0249 04/22/20 0334 04/23/20 0256 04/24/20 0144 04/25/20 0219  WBC 9.6 13.7* 11.4* 8.8 10.6*  CREATININE 0.52 0.52 0.50 0.49 0.49    Estimated Creatinine Clearance: 54.5 mL/min (by C-G formula based on SCr of 0.49 mg/dL).    Allergies  Allergen Reactions  . Nitroglycerin Nausea And Vomiting  . Ampicillin Other (See Comments)    Per allergy test    Antimicrobials this admission: 7/26 CTX x1 7/29 cefoxitin> 7/30 8/3 doxy>8/4 8/4 meropenem>  Microbiology results: 7/26 UCx: recollect    Brendolyn Patty, PharmD Clinical Pharmacist  04/25/2020   11:37 AM   Please check AMION for all [place] phone numbers After 10:00 PM, call the [place] [phone]

## 2020-04-25 NOTE — Plan of Care (Signed)

## 2020-04-25 NOTE — Consult Note (Signed)
[place] Nurse ostomy follow up Patient receiving care in [facility] 6N25 Stoma type/location: LUQ colostomy Stomal assessment/size: deferred Peristomal assessment: deferred Treatment options for stomal/peristomal skin: barrier ring if needed Output: pudding consistency brown  Ostomy pouching: 1pc., Pattricia Boss Education provided: Patient and niece [name] emptied existing pouch during teaching session. Dr. Redmond Pulling explained to them at the time of our joint session, that [facility] RN may not be available, then the choices for discharge are home and for them to be responsible for all care, a private duty nurse if one can be located by the family, or a skilled nursing facility. Enrolled patient in [place] Discharge program: Yes  I provided 5 flat one piece pouches, a graduated cylinder, scissors, and a box of medium gloves for discharge home use. The plan is that I will return tomorrow and the patient, [name] and I will change the pouch.  Until then, the patient and [name] should continue to empty the pouch. Val Riles, RN, MSN, CWOCN, CNS-BC, pager [phone]

## 2020-04-25 NOTE — Progress Notes (Signed)
Nutrition Follow-up  DOCUMENTATION CODES:   Obesity unspecified  INTERVENTION:   -Continue MVI with minerals daily -Continue Ensure Enlive po BID, each supplement provides 350 kcal and 20 grams of protein  NUTRITION DIAGNOSIS:   Inadequate oral intake related to altered GI function as evidenced by NPO status.  Progressing; advanced to soft diet on 04/24/20  GOAL:   Patient will meet greater than or equal to 90% of their needs  Progressing   MONITOR:   PO intake, Supplement acceptance, Diet advancement, Labs, Weight trends, Skin, I & O's  REASON FOR ASSESSMENT:   Malnutrition Screening Tool    ASSESSMENT:   Bailey Walker is a 74 y.o. female with medical history significant of hypertension, type 2 diabetes mellitus, hypothyroidism, obesity presents to emergency department with abdominal pain and vomiting since 2 days  7/29- s/p flex sig- revealed complete obstruction of sigmoid colon (biopsied) 7/30- s/p Procedure(s): SIGMOID COLECTOMY ANDENDCOLOSTOMY  8/1- advanced to full liquid diet 8/3- biopsies revealed stage IIIb colon adenocarcinoma, advanced to soft diet  Reviewed I/O's: +1.2 L x 24 hours and +9.6 L since admission  Pt resting in recliner chair at time of visit. No family members at bedside.   Pt tolerating soft diet well. Noted meal completion 50%. Pt is consuming Ensure supplements.   Per MD notes, plan home health vs SNF placement at discharge.   Labs reviewed: Phos: 1.3, CBGS: 95-136 (inpatient orders for glycemic control are 0-20 units insulin aspart every 4 hours).   NUTRITION - FOCUSED PHYSICAL EXAM:    Most Recent Value  Orbital Region No depletion  Upper Arm Region No depletion  Thoracic and Lumbar Region No depletion  Buccal Region No depletion  Temple Region No depletion  Clavicle Bone Region No depletion  Clavicle and Acromion Bone Region No depletion  Scapular Bone Region No depletion  Dorsal Hand No depletion  Patellar Region  No depletion  Anterior Thigh Region No depletion  Posterior Calf Region No depletion  Edema (RD Assessment) Mild  Hair Reviewed  Eyes Reviewed  Mouth Reviewed  Skin Reviewed  Nails Reviewed       Diet Order:   Diet Order            DIET SOFT Room service appropriate? Yes; Fluid consistency: Thin  Diet effective now                 EDUCATION NEEDS:   No education needs have been identified at this time  Skin:  Skin Assessment: Skin Integrity Issues: Skin Integrity Issues:: Incisions Incisions: closed abdomen  Last BM:  04/24/20 (via colostomy)  Height:   Ht Readings from Last 1 Encounters:  04/19/20 5\' 1"  (1.549 m)    Weight:   Wt Readings from Last 1 Encounters:  04/19/20 72.6 kg    Ideal Body Weight:  47.7 kg  BMI:  Body mass index is 30.24 kg/m.  Estimated Nutritional Needs:   Kcal:  1600-1800  Protein:  85-100 grams  Fluid:  > 1.6 L    Loistine Chance, RD, LDN, [place] Registered Dietitian II Certified Diabetes Care and Education Specialist Please refer to [facility] for RD and/or RD on-call/weekend/after hours pager

## 2020-04-25 NOTE — Progress Notes (Addendum)
5 Days Post-Op  Subjective: CC:  Patient notes no abdominal pain at rest. She has pain on the LUQ and around colostomy when she gets out of bed. She is tolerating soft diet without increased abdominal pain, n/v. Continues to have good colostomy output. Mobilizing with walker.   Family at bedside who translates.   Objective: Vital signs in last 24 hours: Temp:  [98 F (36.7 C)-98.6 F (37 C)] 98.4 F (36.9 C) (08/04 0359) Pulse Rate:  [78-89] 78 (08/04 0359) Resp:  [15-17] 15 (08/04 0359) BP: (130-133)/(53-60) 130/53 (08/04 0359) SpO2:  [96 %-97 %] 96 % (08/04 0359) Last BM Date: 04/24/20  Intake/Output from previous day: 08/03 0701 - 08/04 0700 In: 1240 [P.O.:1240] Out: -  Intake/Output this shift: No intake/output data recorded.  PE: Gen: Alert, NAD, pleasant Pulm: rate and effort normal Abd: Soft,mild distension, midline wound as noted below. There is beefy red granulation tissue at the side and base of the wound. There is one area of tunneling 3cm form the base of the wound that extends 2.5cm deep. At the proximal aspect of the wound there is a 2cm area of tunneling cephalad. There is noted blanchable erythema, with some heat and firmness that extends on the sides of midline wound b/l and across to the flank. TTP LUQ without peritonitis. Colostomy bag in place with liquid brown stool and air in bag. +flatus and Bm while in the room. Stoma round, budded with some stoma necrosis at superior aspect. Ext: No LE edema Skin:Other than as noted above,no rashes noted, warm and dry      Lab Results:  Recent Labs    04/24/20 0144 04/25/20 0219  WBC 8.8 10.6*  HGB 10.2* 9.5*  HCT 32.1* 30.4*  PLT 332 315   BMET Recent Labs    04/23/20 0256 04/24/20 0144  NA 133* 133*  K 3.2* 3.0*  CL 103 102  CO2 22 22  GLUCOSE 93 113*  BUN 5* <5*  CREATININE 0.50 0.49  CALCIUM 6.8* 6.7*   PT/INR No results for input(s): LABPROT, INR in the last 72 hours. CMP       Component Value Date/Time   NA 133 (L) 04/24/2020 0144   NA 139 07/12/2019 1110   K 3.0 (L) 04/24/2020 0144   CL 102 04/24/2020 0144   CO2 22 04/24/2020 0144   GLUCOSE 113 (H) 04/24/2020 0144   BUN <5 (L) 04/24/2020 0144   BUN 15 07/12/2019 1110   CREATININE 0.49 04/24/2020 0144   CREATININE 0.74 01/08/2017 1100   CALCIUM 6.7 (L) 04/24/2020 0144   PROT 5.5 (L) 04/23/2020 0256   PROT 7.1 02/11/2019 1455   ALBUMIN 2.6 (L) 04/23/2020 0256   ALBUMIN 4.4 02/11/2019 1455   AST 15 04/23/2020 0256   ALT 18 04/23/2020 0256   ALKPHOS 58 04/23/2020 0256   BILITOT 1.1 04/23/2020 0256   BILITOT <0.2 02/11/2019 1455   GFRNONAA >60 04/24/2020 0144   GFRNONAA 81 01/08/2017 1100   GFRAA >60 04/24/2020 0144   GFRAA >89 01/08/2017 1100   Lipase     Component Value Date/Time   LIPASE 19 04/16/2020 0042       Studies/Results: No results found.  Anti-infectives: Anti-infectives (From admission, onward)   Start     Dose/Rate Route Frequency Ordered Stop   04/24/20 0915  doxycycline (VIBRA-TABS) tablet 100 mg     Discontinue     100 mg Oral Every 12 hours 04/24/20 0850     04/20/20 1200  cefOXitin (MEFOXIN) 2 g in sodium chloride 0.9 % 100 mL IVPB        2 g 200 mL/hr over 30 Minutes Intravenous Every 8 hours 04/20/20 1130 04/20/20 1245   04/19/20 1800  cefOXitin (MEFOXIN) 2 g in sodium chloride 0.9 % 100 mL IVPB  Status:  Discontinued        2 g 200 mL/hr over 30 Minutes Intravenous Every 8 hours 04/19/20 1659 04/20/20 1130   04/17/20 0000  cefTRIAXone (ROCEPHIN) 1 g in sodium chloride 0.9 % 100 mL IVPB        1 g 200 mL/hr over 30 Minutes Intravenous  Once 04/16/20 1604 04/16/20 2347   04/16/20 0115  cefTRIAXone (ROCEPHIN) 1 g in sodium chloride 0.9 % 100 mL IVPB        1 g 200 mL/hr over 30 Minutes Intravenous  Once 04/16/20 0113 04/16/20 0435       Assessment/Plan HTN T2DM Hypothyroidism ABL anemia - Hgb stable at 10.2 Electrolyte abnormalities - Phos 1.3. BMP pending.   - Per TRH -   Adenocarcinoma of the colon  -s/p sigmoid colectomy and end colostomy by Dr. Ninfa Linden on 7/30 - POD # 5 -Path with invasive adenocarcinoma with negative resection margins, metastatic carcinoma of 1/15 lymph notes and 1 satellite nodule. Pre-opCEA mildly elevated at 14. Dr. Marin Olp following. CT chest without metastasis. Med Onc saw 8/3 and will follow - Midline wound opened with evacuation of hematoma 8/2. BID WTD dressing changes. Not a good candidate for vac with tunneling of wound. On abx for cellulitis. Switch to IV. Monitor WBC, may need CT in 1-2 days.  - Mobilize - IS  FEN: Soft  VTE: SCDs, lovenox ID: rocephin x1. Doxy 8/3 - 8/4. Zosyn 8/4 >>. Afebrile. WBC 10.6 Foley - Out Follow-Up - Dr. Ninfa Linden  Dispo - TOC looking into SNF   LOS: 9 days    Jillyn Ledger , [facility] Surgery 04/25/2020, 9:47 AM Please see Amion for pager number during day hours 7:00am-4:30pm

## 2020-04-25 NOTE — Evaluation (Signed)
Physical Therapy Evaluation Patient Details Name: Bailey Walker MRN: [id number] DOB: 1946/02/19 Today's Date: 04/25/2020   History of Present Illness  Pt is a 74 y.o. F with significant PMH of hypertension, type 2 diabetes mellitus, obesity who presents with abdominal pain and vomiting. 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 Surgical pathology confirms adenocarcinoma. CT chest without evidence of metastatic disease noted. UA also positive for UTI.   Clinical Impression  Prior to admission, pt lives with her niece, requires assist for ADL's and is a household ambulator with no AD. Pt presents with decreased functional mobility secondary to abdominal pain, weakness, balance deficits. Ambulating ~75 feet with a walker at a min guard assist level. Suspect steady progress.     Follow Up Recommendations Home health PT;Supervision for mobility/OOB (pt/pt niece preference)    Equipment Recommendations  Rolling walker with 5" wheels;3in1 (PT)    Recommendations for Other Services       Precautions / Restrictions Precautions Precautions: Fall;Other (comment) Precaution Comments: open abdominal wound, colostomy Restrictions Weight Bearing Restrictions: No      Mobility  Bed Mobility Overal bed mobility: Needs Assistance Bed Mobility: Sit to Supine       Sit to supine: Min assist   General bed mobility comments: MinA for LE assist back into bed  Transfers Overall transfer level: Needs assistance Equipment used: Rolling walker (2 wheeled) Transfers: Sit to/from Stand Sit to Stand: Min guard         General transfer comment: Min guard to rise to stand  Ambulation/Gait Ambulation/Gait assistance: Min guard Gait Distance (Feet): 75 Feet Assistive device: Rolling walker (2 wheeled) Gait Pattern/deviations: Step-through pattern;Decreased stride length Gait velocity: decreased   General  Gait Details: Slow, but steady pace. Cues for walker proximity.  Stairs            Wheelchair Mobility    Modified Rankin (Stroke Patients Only)       Balance Overall balance assessment: Needs assistance Sitting-balance support: Feet supported Sitting balance-Leahy Scale: Good     Standing balance support: Single extremity supported;During functional activity Standing balance-Leahy Scale: Poor Standing balance comment: reliant on at least single UE support                             Pertinent Vitals/Pain Pain Assessment: Faces Faces Pain Scale: Hurts even more Pain Location: abdomen Pain Descriptors / Indicators: Grimacing;Guarding Pain Intervention(s): Limited activity within patient's tolerance;Monitored during session;Patient requesting pain meds-RN notified;Repositioned    Home Living Family/patient expects to be discharged to:: Private residence Living Arrangements: Other relatives (niece and niece sister) Available Help at Discharge: Family;Available PRN/intermittently Type of Home: House Home Access: Stairs to enter Entrance Stairs-Rails: None Entrance Stairs-Number of Steps: 4 Home Layout: One level Home Equipment: None      Prior Function Level of Independence: Needs assistance   Gait / Transfers Assistance Needed: household ambulator, denies recent falls  ADL's / Homemaking Assistance Needed: assist bathing, dressing        Hand Dominance        Extremity/Trunk Assessment   Upper Extremity Assessment Upper Extremity Assessment: Overall WFL for tasks assessed    Lower Extremity Assessment Lower Extremity Assessment: Overall WFL for tasks assessed       Communication   Communication: Prefers language other than English (Arabic, pt niece prefers to interpret)  Cognition Arousal/Alertness: Awake/alert Behavior During Therapy: WFL for  tasks assessed/performed Overall Cognitive Status: Within Functional Limits for tasks  assessed                                        General Comments      Exercises     Assessment/Plan    PT Assessment Patient needs continued PT services  PT Problem List Decreased strength;Decreased activity tolerance;Decreased balance;Decreased mobility;Pain       PT Treatment Interventions DME instruction;Gait training;Stair training;Functional mobility training;Therapeutic activities;Therapeutic exercise;Balance training;Patient/family education    PT Goals (Current goals can be found in the Care Plan section)  Acute Rehab PT Goals Patient Stated Goal: go home with wound care/nursing PT Goal Formulation: With patient Time For Goal Achievement: 05/09/20 Potential to Achieve Goals: Good    Frequency Min 3X/week   Barriers to discharge        Co-evaluation               AM-PAC PT "6 Clicks" Mobility  Outcome Measure Help needed turning from your back to your side while in a flat bed without using bedrails?: None Help needed moving from lying on your back to sitting on the side of a flat bed without using bedrails?: A Little Help needed moving to and from a bed to a chair (including a wheelchair)?: A Little Help needed standing up from a chair using your arms (e.g., wheelchair or bedside chair)?: A Little Help needed to walk in hospital room?: A Little Help needed climbing 3-5 steps with a railing? : A Little 6 Click Score: 19    End of Session   Activity Tolerance: Patient tolerated treatment well Patient left: in bed;with call bell/phone within reach;with family/visitor present Nurse Communication: Mobility status;Patient requests pain meds PT Visit Diagnosis: Pain;Difficulty in walking, not elsewhere classified (R26.2) Pain - part of body:  (abdomen)    Time: 9244-6286 PT Time Calculation (min) (ACUTE ONLY): 32 min   Charges:   PT Evaluation $PT Eval Moderate Complexity: 1 Mod PT Treatments $Gait Training: 8-22 mins           Wyona Almas, PT, DPT Acute Rehabilitation Services Pager [phone] Office [phone]   Deno Etienne 04/25/2020, 2:34 PM

## 2020-04-25 NOTE — TOC Initial Note (Addendum)
Transition of Care [facility]) - Initial/Assessment Note    Patient Details  Name: Bailey Walker MRN: [id number] Date of Birth: May 08, 1946  Transition of Care [facility]) CM/SW Contact:    Alexander Mt, LCSW Phone Number:  04/25/2020, 11:41 AM  Clinical Narrative:                 CSW spoke with pt at bedside. Introduced self, role, reason for call. Confirmed details from conversation with Nira Conn, [facility] yesterday. Pt niece [name] in the room and she called family member Meryl Dare who introduces herself as a former Advice worker in [place] explained barriers to arranging 24 hr care at home and that we have been unable to locate a [facility] agency at this time for pt due to Medicaid only status (confirmed pt doesn't have Medicare but does receive SSI check). They would like HH and DME (hospital bed, mattress, 3n1) and confirmed they plan on going to niece's home at d/c.   Pt family states that they are concerned about SNF and dont feel pt would do well there (due to language barriers etc) and are declining verbally a SNF at this time. They continue to state they prefer to have her home. Again, I discussed with family that they would be responsible for learning wound care and ostomy care for when the pt is not being seen by RN. [name] and [place] state understanding that they would be responsible for learning care but again requesting HHRN. They also inquired about an aide. CSW reinforced that we are limited to getting [facility] if anything and that we cannot even guarantee that at this point. I let [name] know that I would speak with my leadership about this case and see if they can assist with locating any services.   CSW spoke with Olga Coaster, RNCM and [facility] supervisor. She will attempt to locate and [facility] that we can, at this time continues to not be any agencies able to staff case/accept Medicaid. Family aware they may need to supplement this/hire home care providers under private pay.   Expected  Discharge Plan: [place] Barriers to Discharge: Continued Medical Work up, Inadequate or no insurance, Other (comment), No [facility] will accept this patient (new colostomy/wound care needs)   Patient Goals and CMS Choice Patient states their goals for this hospitalization and ongoing recovery are:: for her to go home and have help with wound care CMS Medicare.gov Compare Post Acute Care list provided to:: Other (Comment Required) (pt niece) Choice offered to / list presented to : Patient (pt nieces)  Expected Discharge Plan and Services Expected Discharge Plan: [place] In-house Referral: Clinical Social Work Discharge Planning Services: CM Consult Post Acute Care Choice: Durable Medical Equipment Living arrangements for the past 2 months: Single Family Home  Prior Living Arrangements/Services Living arrangements for the past 2 months: Single Family Home Lives with:: Self, Relatives Patient language and need for interpreter reviewed:: Yes (no dialect on [place]/interpreters. Family used for interpretation at pt request) Do you feel safe going back to the place where you live?: Yes      Need for Family Participation in Patient Care: Yes (Comment) (assistance w/ daily cares) Care giver support system in place?: Yes (comment) (nieces)   Criminal Activity/Legal Involvement Pertinent to Current Situation/Hospitalization: No - Comment as needed  Activities of Daily Living Home Assistive Devices/Equipment: None ADL Screening (condition at time of admission) Patient's cognitive ability adequate to safely complete daily  activities?: Yes Is the patient deaf or have difficulty hearing?: No Does the patient have difficulty seeing, even when wearing glasses/contacts?: No Does the patient have difficulty concentrating, remembering, or making decisions?: No Patient able to express need for assistance with ADLs?: Yes Does the patient have difficulty  dressing or bathing?: No Independently performs ADLs?: Yes (appropriate for developmental age) Does the patient have difficulty walking or climbing stairs?: Yes Weakness of Legs: None Weakness of Arms/Hands: None  Permission Sought/Granted Permission sought to share information with : Family Supports Permission granted to share information with : Yes, Verbal Permission Granted   Emotional Assessment Appearance:: Appears stated age Attitude/Demeanor/Rapport: Unable to Assess (language barrier) Affect (typically observed): Unable to Assess (language barrier) Orientation: : Oriented to Self, Oriented to Place, Oriented to  Time, Oriented to Situation Alcohol / Substance Use: Not Applicable Psych Involvement: No (comment)  Admission diagnosis:  Dehydration [E86.0] Abdominal distension [R14.0] Colon obstruction (HCC) [K56.609] Generalized abdominal pain [R10.84] Acute UTI [N39.0] Nausea and vomiting in adult [R11.2] Colonic mass [K63.89] Patient Active Problem List   Diagnosis Date Noted  . Gastritis and gastroduodenitis   . Grade II internal hemorrhoids   . Abdominal distension   . Colonic mass   . Nausea and vomiting in adult   . Abnormal CT scan, gastrointestinal tract   . Colon obstruction ([place]) 04/16/2020  . UTI (urinary tract infection) 04/16/2020  . Thrombocytosis ([place]) 01/27/2020  . Dyspnea on exertion 01/27/2020  . Iron deficiency anemia 01/26/2020  . Vitamin B12 deficiency anemia 01/26/2020  . 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 Fuchs' corneal dystrophy 09/20/2018  . 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 Diabetes mellitus due to underlying condition with unspecified complications ([place]) 63/81/7711  . Ex-smoker 08/26/2017  . Atypical chest pain 08/26/2017  . Essential hypertension 01/11/2017  . Hypothyroidism 01/08/2017  . SVT (supraventricular  tachycardia) ([place]) 01/08/2017   PCP:  [name], MD Pharmacy:   [id number] [place], [address] [place] [address] Phone: [phone] Fax: [phone]  Readmission Risk Interventions Readmission Risk Prevention Plan 04/25/2020  Transportation Screening Complete  PCP or Specialist Appt within 3-5 Days Not Complete  Not Complete comments pending disposition  HRI or Home Care Consult Complete  Social Work Consult for [place] Planning/Counseling Complete  Palliative Care Screening Not Applicable  Medication Review Press photographer) Referral to Pharmacy  Some recent data might be hidden

## 2020-04-26 ENCOUNTER — Encounter (HOSPITAL_COMMUNITY): Payer: Self-pay | Admitting: Family Medicine

## 2020-04-26 DIAGNOSIS — C187 Malignant neoplasm of sigmoid colon: Secondary | ICD-10-CM

## 2020-04-26 DIAGNOSIS — C772 Secondary and unspecified malignant neoplasm of intra-abdominal lymph nodes: Secondary | ICD-10-CM

## 2020-04-26 DIAGNOSIS — Z7189 Other specified counseling: Secondary | ICD-10-CM

## 2020-04-26 HISTORY — DX: Secondary and unspecified malignant neoplasm of intra-abdominal lymph nodes: C18.7

## 2020-04-26 HISTORY — DX: Other specified counseling: Z71.89

## 2020-04-26 HISTORY — DX: Secondary and unspecified malignant neoplasm of intra-abdominal lymph nodes: C77.2

## 2020-04-26 LAB — GLUCOSE, CAPILLARY
Glucose-Capillary: 106 mg/dL — ABNORMAL HIGH (ref 70–99)
Glucose-Capillary: 115 mg/dL — ABNORMAL HIGH (ref 70–99)
Glucose-Capillary: 128 mg/dL — ABNORMAL HIGH (ref 70–99)
Glucose-Capillary: 139 mg/dL — ABNORMAL HIGH (ref 70–99)
Glucose-Capillary: 150 mg/dL — ABNORMAL HIGH (ref 70–99)
Glucose-Capillary: 179 mg/dL — ABNORMAL HIGH (ref 70–99)
Glucose-Capillary: 80 mg/dL (ref 70–99)

## 2020-04-26 LAB — BASIC METABOLIC PANEL
Anion gap: 9 (ref 5–15)
BUN: <5 mg/dL — ABNORMAL LOW (ref 8–23)
CO2: 24 mmol/L (ref 22–32)
Calcium: 6.5 mg/dL — ABNORMAL LOW (ref 8.9–10.3)
Chloride: 104 mmol/L (ref 98–111)
Creatinine, Ser: 0.56 mg/dL (ref 0.44–1.00)
GFR calc Af Amer: >60 mL/min (ref >60)
GFR calc non Af Amer: >60 mL/min (ref >60)
Glucose, Bld: 125 mg/dL — ABNORMAL HIGH (ref 70–99)
Potassium: 2.8 mmol/L — ABNORMAL LOW (ref 3.5–5.1)
Sodium: 137 mmol/L (ref 135–145)

## 2020-04-26 LAB — CBC WITH DIFFERENTIAL/PLATELET
Abs Immature Granulocytes: 0.11 10*3/uL — ABNORMAL HIGH (ref 0.00–0.07)
Basophils Absolute: 0 10*3/uL (ref 0.0–0.1)
Basophils Relative: 0 %
Eosinophils Absolute: 0.2 10*3/uL (ref 0.0–0.5)
Eosinophils Relative: 2 %
HCT: 30.3 % — ABNORMAL LOW (ref 36.0–46.0)
Hemoglobin: 9.4 g/dL — ABNORMAL LOW (ref 12.0–15.0)
Immature Granulocytes: 1 %
Lymphocytes Relative: 17 %
Lymphs Abs: 1.6 10*3/uL (ref 0.7–4.0)
MCH: 28.1 pg (ref 26.0–34.0)
MCHC: 31 g/dL (ref 30.0–36.0)
MCV: 90.7 fL (ref 80.0–100.0)
Monocytes Absolute: 0.9 10*3/uL (ref 0.1–1.0)
Monocytes Relative: 9 %
Neutro Abs: 6.8 10*3/uL (ref 1.7–7.7)
Neutrophils Relative %: 71 %
Platelets: 339 10*3/uL (ref 150–400)
RBC: 3.34 MIL/uL — ABNORMAL LOW (ref 3.87–5.11)
RDW: 15 % (ref 11.5–15.5)
WBC: 9.6 10*3/uL (ref 4.0–10.5)
nRBC: 0 % (ref 0.0–0.2)

## 2020-04-26 LAB — PHOSPHORUS
Phosphorus: 1.4 mg/dL — ABNORMAL LOW (ref 2.5–4.6)
Phosphorus: 1 mg/dL — CL (ref 2.5–4.6)

## 2020-04-26 LAB — T4, FREE: Free T4: 0.98 ng/dL (ref 0.61–1.12)

## 2020-04-26 LAB — MAGNESIUM: Magnesium: 1.9 mg/dL (ref 1.7–2.4)

## 2020-04-26 MED ORDER — K PHOS MONO-SOD PHOS DI & MONO 155-852-130 MG PO TABS
500.0000 mg | ORAL_TABLET | Freq: Three times a day (TID) | ORAL | Status: DC
  Administered 2020-04-26 – 2020-04-28 (×10): 500 mg via ORAL
  Filled 2020-04-26 (×11): qty 2

## 2020-04-26 MED ORDER — DOCUSATE SODIUM 100 MG PO CAPS
100.0000 mg | ORAL_CAPSULE | Freq: Two times a day (BID) | ORAL | Status: DC
  Administered 2020-04-26 – 2020-05-02 (×13): 100 mg via ORAL
  Filled 2020-04-26 (×14): qty 1

## 2020-04-26 MED ORDER — POTASSIUM CHLORIDE CRYS ER 20 MEQ PO TBCR
40.0000 meq | EXTENDED_RELEASE_TABLET | Freq: Two times a day (BID) | ORAL | Status: DC
  Administered 2020-04-26 – 2020-04-28 (×5): 40 meq via ORAL
  Filled 2020-04-26 (×5): qty 2

## 2020-04-26 MED ORDER — POTASSIUM PHOSPHATES 15 MMOLE/5ML IV SOLN
45.0000 mmol | Freq: Once | INTRAVENOUS | Status: AC
  Administered 2020-04-26: 45 mmol via INTRAVENOUS
  Filled 2020-04-26: qty 15

## 2020-04-26 MED ORDER — POTASSIUM CHLORIDE 10 MEQ/100ML IV SOLN
10.0000 meq | INTRAVENOUS | Status: AC
  Administered 2020-04-26 (×5): 10 meq via INTRAVENOUS
  Filled 2020-04-26 (×5): qty 100

## 2020-04-26 MED ORDER — POTASSIUM CHLORIDE 10 MEQ/100ML IV SOLN
INTRAVENOUS | Status: AC
  Administered 2020-04-26: 10 meq via INTRAVENOUS
  Filled 2020-04-26: qty 100

## 2020-04-26 NOTE — Progress Notes (Signed)
CRITICAL VALUE ALERT  Critical Value:  Phosphate 1.0  Date & Time Notied:  2140 04/26/2020  Provider Notified: MD on call notified  Orders Received/Actions taken: No new orders given at this time

## 2020-04-26 NOTE — Progress Notes (Addendum)
6 Days Post-Op  Subjective: CC: Patients family helps translate. They are about to do colostomy bag teaching with WOCN. Patient denies any pain except with palpation. Had some nausea with mac and cheese yesterday. Tolerated jello. Did not have much of her nutritional shakes. Having stool output from colostomy. Mobilizing. Voiding.  Objective: Vital signs in last 24 hours: Temp:  [98.2 F (36.8 C)-98.7 F (37.1 C)] 98.2 F (36.8 C) (08/05 0406) Pulse Rate:  [73-81] 73 (08/05 0406) Resp:  [16-17] 17 (08/05 0406) BP: (115-127)/(56-59) 126/56 (08/05 0406) SpO2:  [96 %-98 %] 96 % (08/05 0406) Last BM Date: 04/24/20  Intake/Output from previous day: 08/04 0701 - 08/05 0700 In: 3827.3 [P.O.:400; I.V.:3227.3; IV Piggyback:200] Out: 150 [Stool:150] Intake/Output this shift: No intake/output data recorded.  PE: Gen: Alert, NAD, pleasant Pulm: rate and effort normal Abd: Soft,mild distension, midline woundas noted below. There is beefy red granulation tissue at the sides of the wound. At the proximal aspect of the wound there appears to be sloughing of fibrinous tissue. There is one area of tunneling 3cm form the base of the wound that extends 2.5cm deep. At the proximal aspect of the wound there is a 2cm area of tunneling cephalad. There is notedblanchable erythema, with some heat and firmness that extends on the sides of midline woundb/l and across to the flank that is similar in appears as yesterday. TTP epigastrium and LLQ without peritonitis.Colostomy bag in place with liquid brown stool and air in bag. +flatus and Bm while in the room. Stoma round, budded with some stoma necrosis at superior aspect. Ext: No LE edema Skin:Other than as noted above,no rashes noted, warm and dry  Lab Results:  Recent Labs    04/25/20 0219 04/26/20 0444  WBC 10.6* 9.6  HGB 9.5* 9.4*  HCT 30.4* 30.3*  PLT 315 339   BMET Recent Labs    04/25/20 0219 04/26/20 0444  NA 134* 137  K  3.0* 2.8*  CL 101 104  CO2 21* 24  GLUCOSE 115* 125*  BUN <5* <5*  CREATININE 0.49 0.56  CALCIUM 6.6* 6.5*   PT/INR No results for input(s): LABPROT, INR in the last 72 hours. CMP     Component Value Date/Time   NA 137 04/26/2020 0444   NA 139 07/12/2019 1110   K 2.8 (L) 04/26/2020 0444   CL 104 04/26/2020 0444   CO2 24 04/26/2020 0444   GLUCOSE 125 (H) 04/26/2020 0444   BUN <5 (L) 04/26/2020 0444   BUN 15 07/12/2019 1110   CREATININE 0.56 04/26/2020 0444   CREATININE 0.74 01/08/2017 1100   CALCIUM 6.5 (L) 04/26/2020 0444   PROT 5.5 (L) 04/23/2020 0256   PROT 7.1 02/11/2019 1455   ALBUMIN 2.6 (L) 04/23/2020 0256   ALBUMIN 4.4 02/11/2019 1455   AST 15 04/23/2020 0256   ALT 18 04/23/2020 0256   ALKPHOS 58 04/23/2020 0256   BILITOT 1.1 04/23/2020 0256   BILITOT <0.2 02/11/2019 1455   GFRNONAA >60 04/26/2020 0444   GFRNONAA 81 01/08/2017 1100   GFRAA >60 04/26/2020 0444   GFRAA >89 01/08/2017 1100   Lipase     Component Value Date/Time   LIPASE 19 04/16/2020 0042       Studies/Results: No results found.  Anti-infectives: Anti-infectives (From admission, onward)   Start     Dose/Rate Route Frequency Ordered Stop   04/25/20 1115  meropenem (MERREM) 1 g in sodium chloride 0.9 % 100 mL IVPB     Discontinue  1 g 200 mL/hr over 30 Minutes Intravenous Every 8 hours 04/25/20 1106     04/24/20 0915  doxycycline (VIBRA-TABS) tablet 100 mg  Status:  Discontinued        100 mg Oral Every 12 hours 04/24/20 0850 04/25/20 1106   04/20/20 1200  cefOXitin (MEFOXIN) 2 g in sodium chloride 0.9 % 100 mL IVPB        2 g 200 mL/hr over 30 Minutes Intravenous Every 8 hours 04/20/20 1130 04/20/20 1245   04/19/20 1800  cefOXitin (MEFOXIN) 2 g in sodium chloride 0.9 % 100 mL IVPB  Status:  Discontinued        2 g 200 mL/hr over 30 Minutes Intravenous Every 8 hours 04/19/20 1659 04/20/20 1130   04/17/20 0000  cefTRIAXone (ROCEPHIN) 1 g in sodium chloride 0.9 % 100 mL IVPB         1 g 200 mL/hr over 30 Minutes Intravenous  Once 04/16/20 1604 04/16/20 2347   04/16/20 0115  cefTRIAXone (ROCEPHIN) 1 g in sodium chloride 0.9 % 100 mL IVPB        1 g 200 mL/hr over 30 Minutes Intravenous  Once 04/16/20 0113 04/16/20 0435       Assessment/Plan HTN T2DM Hypothyroidism ABL anemia - Hgb 9.4 Electrolyte abnormalities - Phos 1.4. K 2.8, replace   Adenocarcinoma of the colon  -s/p sigmoid colectomy and end colostomy by Dr. Ninfa Linden on 7/30 - POD #6 -Path with invasive adenocarcinoma with negative resection margins, metastatic carcinoma of 1/15 lymph notes and 1 satellite nodule. Pre-opCEA mildly elevated at 14. Dr. Marin Olp following. CT chest without metastasis. Med Onc saw 8/3 and will follow -Midline wound opened with evacuation of hematoma 8/2. BID WTD dressing changes.Not a good candidate for vac with tunneling of wound. On IV abx for cellulitis. Switch to IV. - Monitor WBC. If develops leukocytosis again or develops fever, may need CT in 1-2 days.  - Mobilize - IS  JIZ:XYOF, nutritional shakes, Phos 1.4, K 2.8 VTE: SCDs, lovenox ID: rocephin x1. Doxy 8/3 - 8/4. Merrem 8/4 >>. Afebrile. WBC 9.4 Foley - Out Follow-Up - Dr. Ninfa Linden  Dispo - TOC looking into [facility] vs SNF. No offers for [facility] currently. Family is looking into private RN as well.    LOS: 10 days    Jillyn Ledger , [facility] 04/26/2020, 8:57 AM Please see Amion for pager number during day hours 7:00am-4:30pm

## 2020-04-26 NOTE — Consult Note (Signed)
[place] Nurse ostomy follow up Patient receiving care in [place].  Niece, [name], in room for teaching and translation. Stoma type/location: LUQ colostomy Stomal assessment/size: 1 3/4 inches Peristomal assessment: inact.  Erythema to abdomen now up to level of stoma. Stoma moist, dark red, sutures intact, budded. Some stomal necrosis from 10 o'clock to approximately 2 o'clock. Treatment options for stomal/peristomal skin: use of barrier ring today Output: small amount of liquid brown in existing pouch. Ostomy pouching: 1pc. flat Education provided:  Patient closed new pouch and removed old pouch. Niece cut new pouch to correct size opening. Both observed placement of the barrier ring and new pouching system. Enrolled patient in [place] Start Discharge program: Yes 04/25/20. Val Riles, RN, MSN, CWOCN, CNS-BC, pager [phone]

## 2020-04-26 NOTE — Plan of Care (Signed)

## 2020-04-26 NOTE — TOC Progression Note (Addendum)
Transition of Care [facility]) - Progression Note    Patient Details  Name: Bailey Walker MRN: [id number] Date of Birth: April 07, 1946  Transition of Care [facility]) CM/SW Register, [place] Phone Number:  04/26/2020, 4:01 PM  Clinical Narrative:    CSW spoke with pt niece [name] via telephone twice. They still state they want to take pt home. We discussed that the [facility] team has followed up with Miranda at [facility] and that we were waiting for her response. [name] states that she has contacted [place] and they had told her to "call [facility] for expedited services", and voiced the family is interested in submitting for coverage under her Medicaid which has been told to them by [facility]. I explained that even if we have prior auth unless we have an agency willing to accept the Medicaid we may not be able to get services under her Medicaid benefits and it still may be privat pay for nursing.   TOC team was sent prior auth form from [facility], we have inquired if [facility] team here will sign paperwork. Pt family states PCP office said hospital team must fill out paperwork. Will review paperwork tomorrow and f/u with both [facility] and [facility]. Explained to family again that I was not sure PCS was able to do wound care and therefore they may have to pay for private pay nursing. Niece again states that she "cannot even look at wound- I will faint." Provided support and encouragement, again reinforced that pt family will still need to play an active role in her care. Niece states understanding.   Expected Discharge Plan: [place] Barriers to Discharge: Continued Medical Work up, Inadequate or no insurance, Other (comment), No Home Care Agency will accept this patient (new colostomy/wound care needs)  Expected Discharge Plan and Services Expected Discharge Plan: [place] In-house Referral: Clinical Social Work Discharge Planning Services: CM  Consult Post Acute Care Choice: Durable Medical Equipment Living arrangements for the past 2 months: Single Family Home  Readmission Risk Interventions Readmission Risk Prevention Plan 04/25/2020  Transportation Screening Complete  PCP or Specialist Appt within 3-5 Days Not Complete  Not Complete comments pending disposition  HRI or Home Care Consult Complete  Social Work Consult for [place] Planning/Counseling Complete  Palliative Care Screening Not Applicable  Medication Review Press photographer) Referral to Pharmacy  Some recent data might be hidden

## 2020-04-26 NOTE — Progress Notes (Addendum)
PROGRESS NOTE    Bailey Walker  DDU:[id number] DOB: 1946/03/14 DOA: 04/15/2020 PCP: [name], MD    Brief Narrative:  74 y.o.femalewith medical history significant ofhypertension, type 2 diabetes mellitus, hypothyroidism, obesity presents to emergency department with abdominal pain and vomiting since 2 days.  Patient speaks Arabic. History gathered from patient's daughter at the bedside. She mentioned that patient has severe generalized abdominal pain and vomiting since July 4 however it got worse since 2 days. She could not keep anything down, she has 10 out of 10, nonradiating, sharp pain, no aggravating or relieving factors, associated with vomiting 4 times since yesterday. Vomitus is nonbloody. She has lost about 7 to 10 pounds since July for the due to abdominal pain and vomiting. No history of colon cancer in the family. No night sweats, melena, fever, chills, headache, blurry vision, chest pain, shortness of breath, palpitation, leg swelling.  Reports dysuria since few days. No foul-smelling or change in urinary frequency, lower back pain. She had regular bowel movement this morning.  She is a former smoker. No history of alcohol, illicit drug use.  ED Course:Upon arrival to ED: Patient's vital signs stable. She received IV fluid bolus, Protonix, Zofran, Dilaudid as needed for pain control and Rocephin for possible UTI in ED. 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 Patient transferred to [facility] for further evaluation and management.    Consultants:   Surgery  Procedures:   Antimicrobials:   Meropenem   Subjective: Daughter at bedside interpreting for me.  Patient has no pain, nausea, vomiting.  Eating okay.  Objective: Vitals:   04/25/20 1403 04/25/20 2144 04/26/20 0406 04/26/20 1439  BP: (!) 115/59 (!) 127/56 (!) 126/56 (!) 111/57  Pulse: 79 81 73 86  Resp: 16 16 17 18   Temp:  98.7 F (37.1 C) 98.4 F (36.9 C) 98.2 F (36.8 C) 98.3 F (36.8 C)  TempSrc: Oral Oral Oral Oral  SpO2: 98% 98% 96% 96%  Weight:      Height:        Intake/Output Summary (Last 24 hours) at 04/26/2020 1512 Last data filed at 04/26/2020 0844 Gross per 24 hour  Intake 3427.31 ml  Output 150 ml  Net 3277.31 ml   Filed Weights   04/15/20 1956 04/16/20 1805 04/19/20 0840  Weight: 72.9 kg 72.6 kg 72.6 kg    Examination:  General exam: Appears calm and comfortable , sitting up in bed, nad Respiratory system: CTA, no wheeze rales rhonchi's Cardiovascular system: RRR S1-S2 no murmurs  Gastrointestinal system: Mid abdomen dressing in place.  Bilateral lower abdomen erythema and warmth, mildly improved, but still quite spread out.  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     Data Reviewed: I have personally reviewed following labs and imaging studies  CBC: Recent Labs  Lab 04/22/20 0334 04/23/20 0256 04/24/20 0144 04/25/20 0219 04/26/20 0444  WBC 13.7* 11.4* 8.8 10.6* 9.6  NEUTROABS 10.4* 8.3* 6.2 7.8* 6.8  HGB 11.3* 10.4* 10.2* 9.5* 9.4*  HCT 35.3* 33.2* 32.1* 30.4* 30.3*  MCV 89.1 90.5 89.7 88.9 90.7  PLT 339 304 332 315 237   Basic Metabolic Panel: Recent Labs  Lab 04/22/20 0334 04/22/20 0334 04/23/20 0256 04/23/20 2003 04/24/20 0144 04/25/20 0219 04/26/20 0444  NA 133*  --  133*  --  133* 134* 137  K 2.8*  --  3.2*  --  3.0* 3.0* 2.8*  CL 101  --  103  --  102 101 104  CO2 23  --  22  --  22 21* 24  GLUCOSE 127*  --  93  --  113* 115* 125*  BUN 5*  --  5*  --  <5* <5* <5*  CREATININE 0.52  --  0.50  --  0.49 0.49 0.56  CALCIUM 6.7*  --  6.8*  --  6.7* 6.6* 6.5*  MG 1.8  --  1.7  --  2.1 2.0 1.9  PHOS 1.4*   < > 1.3* 3.3 2.0* 1.3* 1.4*   < > = values in this interval not displayed.    GFR: Estimated Creatinine Clearance: 54.5 mL/min (by C-G formula based on SCr of 0.56 mg/dL). Liver Function Tests: Recent Labs  Lab 04/20/20 0057 04/21/20 0249 04/22/20 0334 04/23/20 0256  AST 13* 19 22 15   ALT 8 14 26 18   ALKPHOS 47 41 49 58  BILITOT 0.7 0.5 0.6 1.1  PROT 5.9* 5.8* 5.3* 5.5*  ALBUMIN 3.2* 2.9* 2.7* 2.6*   No results for input(s): LIPASE, AMYLASE in the last 168 hours. No results for input(s): AMMONIA in the last 168 hours. Coagulation Profile: No results for input(s): INR, PROTIME in the last 168 hours. Cardiac Enzymes: No results for input(s): CKTOTAL, CKMB, CKMBINDEX, TROPONINI in the last 168 hours. BNP (last 3 results) No results for input(s): PROBNP in the last 8760 hours. HbA1C: No results for input(s): HGBA1C in the last 72 hours. CBG: Recent Labs  Lab 04/25/20 1956 04/25/20 2359 04/26/20 0404 04/26/20 0756 04/26/20 1155  GLUCAP 100* 80 106* 128* 139*   Lipid Profile: No results for input(s): CHOL, HDL, LDLCALC, TRIG, CHOLHDL, LDLDIRECT in the last 72 hours. Thyroid Function Tests: Recent Labs    04/25/20 0219 04/26/20 0444  TSH 15.312*  --   FREET4  --  0.98   Anemia Panel: No results for input(s): VITAMINB12, FOLATE, FERRITIN, TIBC, IRON, RETICCTPCT in the last 72 hours. Sepsis Labs: No results for input(s): PROCALCITON, LATICACIDVEN in the last 168 hours.  No results found for this or any previous visit (from the past 240 hour(s)).       Radiology Studies: No results found.      Scheduled Meds: . aspirin EC  81 mg Oral Daily  . Chlorhexidine Gluconate Cloth  6 each Topical Daily  . docusate sodium  100 mg Oral BID  . enoxaparin (LOVENOX) injection  40 mg Subcutaneous Q24H  . feeding supplement (ENSURE ENLIVE)  237 mL Oral BID BM  . insulin aspart  0-20 Units Subcutaneous Q4H  . levothyroxine  75 mcg Oral QAC breakfast  . lidocaine  1 patch Transdermal Q24H  . metoprolol tartrate  25 mg Oral BID  . multivitamin  with minerals  1 tablet Oral Daily  . pantoprazole  40 mg Oral Daily  . phosphorus  500 mg Oral TID  . pneumococcal 23 valent vaccine  0.5 mL Intramuscular Tomorrow-1000  . potassium chloride  40 mEq Oral BID   Continuous Infusions: . dextrose 5 % and 0.45% NaCl 75 mL/hr at 04/25/20 0553  . lactated ringers 20 mL/hr at 04/18/20 0940  . meropenem (MERREM) IV 1 g (04/26/20 1430)  . methocarbamol (ROBAXIN) IV 500 mg (04/26/20 1335)  . potassium chloride 10 mEq (04/26/20 1433)  . 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 -continue IVF hydration for now -Appreciate assistance by GI.  Gastritis noted on EGD. Flex sig now confirming obstructing colonic mass -Appreciate General Surgery assistance.s/p sigmoid colectomy and end colostomy by Dr. 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 Pre-opCEA mildly elevated at 14. Dr. Marin Olp following. CT chest without metastasis. Med Onc saw 8/3 and will follow -Advancing diet per general surgery. Empiric doxycycline started 8/3 by general surgery----Midline wound opened with evacuation of hematoma 8/2. BID WTD dressing changes.Not a good candidate for vac with tunneling of wound.  Continue with  meropenem  may need CT in 1-2 days.  -CT chest without evidence of metastatic disease noted -Oncology following, likely plan for chemo once surgical incision heals Per surgery will need to take small meals, nongreasy and encourage several shakes a day  UTI-patient reported dysuria. UA is positive for leukocyte/bacteria/WBC -She received Rocephin in ED. Completed empiric course of rocephin.  Urine culture results are indeterminate.  Currently asymptomatic.  We will continue to monitor  Hypertension: -metoprolol initially held given soft BP at time of admit -BP now improved on beta blocker. Continue home metoprolol   Hypothyroidism:  TSH elevated at 15.312  Free T4 normal Continue levothyroxine.  We will need to reevaluate this after current medical issues resolve with repeat levels in 4 weeks   Type 2 diabetes mellitus: Hold glipizide and Metformin. Continue R-ISS, check fingersticks   Iron deficiency anemia: H&H is stable at this time -Continue ferrous sulfate  -Followed by hematology as outpatient Dr. Alvy Bimler Monitor CBC  Hypokalemia-we will replace with IV and p.o. Check a.m. levels Magnesium 1.9    DVT prophylaxis: Lovenox subq Code Status: Full Family Communication:  Daughter updated at bedside  Status is: Inpatient  Remains inpatient appropriate because:IV treatments appropriate due to intensity of illness or inability to take PO   Dispo: The patient is from: Home              Anticipated d/c is to: Home versus SNF              Anticipated d/c date is: 2 days              Patient currently is not medically stable to d/c.  Continue needing IV antibiotics needs to be cleared from surgical standpoint and cellulitis improving prior to discharge            LOS: 10 days   Time spent: 45 minutes with more than 50% on [place], MD [facility] Pager [phone]  If 7PM-7AM, please contact  night-coverage [email or web address] Password [email or web address] 04/26/2020, 3:12 PM

## 2020-04-27 LAB — URINALYSIS, ROUTINE W REFLEX MICROSCOPIC
Bilirubin Urine: NEGATIVE
Glucose, UA: NEGATIVE mg/dL
Hgb urine dipstick: NEGATIVE
Ketones, ur: NEGATIVE mg/dL
Leukocytes,Ua: NEGATIVE
Nitrite: NEGATIVE
Protein, ur: NEGATIVE mg/dL
Specific Gravity, Urine: 1.01 (ref 1.005–1.030)
pH: 7 (ref 5.0–8.0)

## 2020-04-27 LAB — PHOSPHORUS: Phosphorus: 2.6 mg/dL (ref 2.5–4.6)

## 2020-04-27 LAB — CBC WITH DIFFERENTIAL/PLATELET
Abs Immature Granulocytes: 0.11 10*3/uL — ABNORMAL HIGH (ref 0.00–0.07)
Basophils Absolute: 0.1 10*3/uL (ref 0.0–0.1)
Basophils Relative: 1 %
Eosinophils Absolute: 0.2 10*3/uL (ref 0.0–0.5)
Eosinophils Relative: 3 %
HCT: 30.2 % — ABNORMAL LOW (ref 36.0–46.0)
Hemoglobin: 9.2 g/dL — ABNORMAL LOW (ref 12.0–15.0)
Immature Granulocytes: 1 %
Lymphocytes Relative: 24 %
Lymphs Abs: 2 10*3/uL (ref 0.7–4.0)
MCH: 27.7 pg (ref 26.0–34.0)
MCHC: 30.5 g/dL (ref 30.0–36.0)
MCV: 91 fL (ref 80.0–100.0)
Monocytes Absolute: 0.8 10*3/uL (ref 0.1–1.0)
Monocytes Relative: 10 %
Neutro Abs: 5.3 10*3/uL (ref 1.7–7.7)
Neutrophils Relative %: 61 %
Platelets: 374 10*3/uL (ref 150–400)
RBC: 3.32 MIL/uL — ABNORMAL LOW (ref 3.87–5.11)
RDW: 15.2 % (ref 11.5–15.5)
WBC: 8.5 10*3/uL (ref 4.0–10.5)
nRBC: 0 % (ref 0.0–0.2)

## 2020-04-27 LAB — BASIC METABOLIC PANEL
Anion gap: 8 (ref 5–15)
BUN: <5 mg/dL — ABNORMAL LOW (ref 8–23)
CO2: 23 mmol/L (ref 22–32)
Calcium: 6.5 mg/dL — ABNORMAL LOW (ref 8.9–10.3)
Chloride: 105 mmol/L (ref 98–111)
Creatinine, Ser: 0.43 mg/dL — ABNORMAL LOW (ref 0.44–1.00)
GFR calc Af Amer: >60 mL/min (ref >60)
GFR calc non Af Amer: >60 mL/min (ref >60)
Glucose, Bld: 112 mg/dL — ABNORMAL HIGH (ref 70–99)
Potassium: 4.3 mmol/L (ref 3.5–5.1)
Sodium: 136 mmol/L (ref 135–145)

## 2020-04-27 LAB — GLUCOSE, CAPILLARY
Glucose-Capillary: 115 mg/dL — ABNORMAL HIGH (ref 70–99)
Glucose-Capillary: 101 mg/dL — ABNORMAL HIGH (ref 70–99)
Glucose-Capillary: 178 mg/dL — ABNORMAL HIGH (ref 70–99)
Glucose-Capillary: 85 mg/dL (ref 70–99)
Glucose-Capillary: 155 mg/dL — ABNORMAL HIGH (ref 70–99)

## 2020-04-27 LAB — MAGNESIUM: Magnesium: 1.8 mg/dL (ref 1.7–2.4)

## 2020-04-27 MED ORDER — ENSURE ENLIVE PO LIQD
237.0000 mL | Freq: Three times a day (TID) | ORAL | Status: DC
  Administered 2020-04-27 – 2020-05-02 (×10): 237 mL via ORAL

## 2020-04-27 NOTE — Progress Notes (Signed)
   04/27/20 1600  Incision (Closed) 04/20/20 Abdomen Other (Comment)  Date First Assessed/Time First Assessed: 04/20/20 1007   Location: Abdomen  Location Orientation: Other (Comment)  Dressing Type Moist to dry  Dressing Clean;Dry;Intact  Dressing Change Frequency Twice a day  Site / Wound Assessment Pink;Red  Margins Unattached edges (unapproximated)  Closure None  Drainage Amount Moderate  Drainage Description Serosanguineous  Treatment Cleansed  pts niece changed Ms. Noonas dressing with minimal assistance successfully, she said she would like to do it again next dressing change in order to be prepared for DC.

## 2020-04-27 NOTE — Progress Notes (Signed)
PROGRESS NOTE    Bailey Walker  JOI:[id number] DOB: 11-Nov-1945 DOA: 04/15/2020 PCP: [name], MD    Brief Narrative:  74 y.o.femalewith medical history significant ofhypertension, type 2 diabetes mellitus, hypothyroidism, obesity presents to emergency department with abdominal pain and vomiting since 2 days.  Patient speaks Arabic. History gathered from patient's daughter at the bedside. She mentioned that patient has severe generalized abdominal pain and vomiting since July 4 however it got worse since 2 days. She could not keep anything down, she has 10 out of 10, nonradiating, sharp pain, no aggravating or relieving factors, associated with vomiting 4 times since yesterday. Vomitus is nonbloody. She has lost about 7 to 10 pounds since July for the due to abdominal pain and vomiting. No history of colon cancer in the family. No night sweats, melena, fever, chills, headache, blurry vision, chest pain, shortness of breath, palpitation, leg swelling.  Reports dysuria since few days. No foul-smelling or change in urinary frequency, lower back pain. She had regular bowel movement this morning.  She is a former smoker. No history of alcohol, illicit drug use.  ED Course:Upon arrival to ED: Patient's vital signs stable. She received IV fluid bolus, Protonix, Zofran, Dilaudid as needed for pain control and Rocephin for possible UTI in ED. 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 Patient transferred to [facility] for further evaluation and management.    Consultants:   Surgery  Procedures:   Antimicrobials:   Meropenem   Subjective: No nausea today as her daughter at her bedside who is translating.  No vomiting.  Passing gas.is have results on dysuria per daughter.   . Objective: Vitals:   04/26/20 1439 04/26/20 2054 04/27/20 0408 04/27/20 1334  BP: (!) 111/57 (!) 124/53 126/69 (!) 123/57  Pulse:  86 79 93 78  Resp: 18 18 16    Temp: 98.3 F (36.8 C) 98.5 F (36.9 C) 98.2 F (36.8 C)   TempSrc: Oral Oral Oral   SpO2: 96% 100% 97% 98%  Weight:      Height:        Intake/Output Summary (Last 24 hours) at 04/27/2020 1541 Last data filed at 04/27/2020 1400 Gross per 24 hour  Intake 2260.06 ml  Output 200 ml  Net 2060.06 ml   Filed Weights   04/15/20 1956 04/16/20 1805 04/19/20 0840  Weight: 72.9 kg 72.6 kg 72.6 kg    Examination:  General exam: Calm and comfortable Respiratory system: CTA, no wheeze rales rhonchi's, normal breathing Cardiovascular system: RRR S1-S2 no murmurs, no gallops Gastrointestinal system: Mid abdomen dressing in place.  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     Data Reviewed: I have personally reviewed following labs and imaging studies  CBC: Recent Labs  Lab 04/23/20 0256 04/24/20 0144 04/25/20 0219 04/26/20 0444 04/27/20 0835  WBC 11.4* 8.8 10.6* 9.6 8.5  NEUTROABS 8.3* 6.2 7.8* 6.8 5.3  HGB 10.4* 10.2* 9.5* 9.4* 9.2*  HCT 33.2* 32.1* 30.4* 30.3* 30.2*  MCV 90.5 89.7 88.9 90.7 91.0  PLT 304 332 315 339 470   Basic Metabolic Panel: Recent Labs  Lab 04/23/20 0256 04/23/20 2003 04/24/20 0144 04/25/20 0219 04/26/20 0444 04/26/20 2014 04/27/20 0835  NA 133*  --  133* 134* 137  --  136  K 3.2*  --  3.0* 3.0* 2.8*  --  4.3  CL 103  --  102 101 104  --  105  CO2 22  --  22 21* 24  --  23  GLUCOSE 93  --  113* 115* 125*  --  112*  BUN 5*  --  <5* <5* <5*  --  <5*  CREATININE 0.50  --  0.49 0.49 0.56  --  0.43*  CALCIUM 6.8*  --  6.7* 6.6* 6.5*  --  6.5*  MG 1.7  --  2.1 2.0 1.9  --  1.8  PHOS 1.3*   < > 2.0* 1.3* 1.4* 1.0* 2.6   < > = values in this interval not displayed.   GFR: Estimated Creatinine  Clearance: 54.5 mL/min (A) (by C-G formula based on SCr of 0.43 mg/dL (L)). Liver Function Tests: Recent Labs  Lab 04/21/20 0249 04/22/20 0334 04/23/20 0256  AST 19 22 15   ALT 14 26 18   ALKPHOS 41 49 58  BILITOT 0.5 0.6 1.1  PROT 5.8* 5.3* 5.5*  ALBUMIN 2.9* 2.7* 2.6*   No results for input(s): LIPASE, AMYLASE in the last 168 hours. No results for input(s): AMMONIA in the last 168 hours. Coagulation Profile: No results for input(s): INR, PROTIME in the last 168 hours. Cardiac Enzymes: No results for input(s): CKTOTAL, CKMB, CKMBINDEX, TROPONINI in the last 168 hours. BNP (last 3 results) No results for input(s): PROBNP in the last 8760 hours. HbA1C: No results for input(s): HGBA1C in the last 72 hours. CBG: Recent Labs  Lab 04/26/20 2057 04/26/20 2327 04/27/20 0410 04/27/20 0901 04/27/20 1157  GLUCAP 150* 115* 115* 101* 178*   Lipid Profile: No results for input(s): CHOL, HDL, LDLCALC, TRIG, CHOLHDL, LDLDIRECT in the last 72 hours. Thyroid Function Tests: Recent Labs    04/25/20 0219 04/26/20 0444  TSH 15.312*  --   FREET4  --  0.98   Anemia Panel: No results for input(s): VITAMINB12, FOLATE, FERRITIN, TIBC, IRON, RETICCTPCT in the last 72 hours. Sepsis Labs: No results for input(s): PROCALCITON, LATICACIDVEN in the last 168 hours.  No results found for this or any previous visit (from the past 240 hour(s)).       Radiology Studies: No results found.      Scheduled Meds: . aspirin EC  81 mg Oral Daily  . Chlorhexidine Gluconate Cloth  6 each Topical Daily  . docusate sodium  100 mg Oral BID  . enoxaparin (LOVENOX) injection  40 mg Subcutaneous Q24H  . feeding supplement (ENSURE ENLIVE)  237 mL Oral TID BM  . insulin aspart  0-20 Units Subcutaneous Q4H  . levothyroxine  75 mcg Oral QAC breakfast  . lidocaine  1 patch Transdermal Q24H  . metoprolol tartrate  25 mg Oral BID  . multivitamin with minerals  1 tablet Oral Daily  . pantoprazole  40 mg  Oral Daily  . phosphorus  500 mg Oral TID  . pneumococcal 23 valent vaccine  0.5 mL Intramuscular Tomorrow-1000  . potassium chloride  40 mEq Oral BID   Continuous Infusions: . dextrose 5 % and 0.45% NaCl 75 mL/hr at 04/25/20 0553  . lactated ringers 20 mL/hr at 04/18/20 0940  . meropenem (MERREM) IV 1 g (04/27/20 1514)  . 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 -continue IVF hydration for now -Appreciate assistance by GI.  Gastritis noted on EGD. Flex sig now confirming obstructing colonic mass -Appreciate General Surgery assistance.s/p sigmoid colectomy and end colostomy by Dr. 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 Pre-opCEA mildly elevated at 14. Dr. Marin Olp following. CT chest without metastasis. Med Onc saw 8/3 and will follow -Advancing diet per general surgery. Empiric doxycycline started 8/3 by general surgery----Midline wound opened with evacuation of hematoma 8/2. BID WTD dressing changes.Not a good candidate for vac with tunneling of wound.   may need CT in 1-2 days. ..>will d/w Gsx -CT chest without evidence of metastatic disease noted -Oncology following, likely plan for chemo once surgical  incision heals Continue her taking small meals, nongreasy and encourage several shakes a day as general surgery had recommended, but daughter verbalizes an understanding and has relayed the message to the patient  Continue iv meropenem   UTI-patient reported dysuria. UA is positive for leukocyte/bacteria/WBC -She received Rocephin in ED. Completed empiric course of rocephin.  Urine culture results are indeterminate.  8/6 patient complaining of dysuria exam.  Will obtain urine culture and UA  Hypertension: -metoprolol initially held given soft BP at time of admit Continue beta-blockers as her blood pressure is stable  Hypothyroidism:  TSH elevated at 15.312  Free T4 normal Continue levothyroxine. Will need repeat thyroid labs in 4 weeks after her current medical issues improves    Type 2 diabetes mellitus: Hold glipizide and Metformin. Blood glucose levels stable  Specialists continue R ISS and checking fingersticks   Iron deficiency anemia: H&H is stable at this time -Continue ferrous sulfate ? I dont see this in mar. Will d/w pharm -Followed by hematology as outpatient Dr. Alvy Bimler Continue monitoring CBC periodically  Hypokalemia/Hypophosh.- Replaced now stable.  K is 4.3 Magnesium 1.9 Phosphate replaced now 2.6 Continue to monitor periodically    DVT prophylaxis: Lovenox subq Code Status: Full Family Communication:  Daughter updated at bedside  Status is: Inpatient  Remains inpatient appropriate because:IV treatments appropriate due to intensity of illness or inability to take PO   Dispo: The patient is from: Home              Anticipated d/c is to: Home versus SNF              Anticipated d/c date is: 2 days              Patient currently is not medically stable to d/c.  Continue needing IV antibiotics needs to be cleared from surgical standpoint and cellulitis improving prior to discharge            LOS: 11 days   Time spent: 45 minutes with  more than 50% on [place], MD [facility] Pager [phone]  If 7PM-7AM, please contact night-coverage [email or web address] Password [email or web address] 04/27/2020, 3:41 PM

## 2020-04-27 NOTE — Consult Note (Signed)
[facility] Nurse ostomy follow up Patient receiving care in [phone].  Niece, [name], in room. Pouching system placed yesterday remains intact.  [name] states she emptied the pouch yesterday.  The abdominal erythema is significantly improved today.  Today the lower abdomen is pink rather than red. Val Riles, RN, MSN, CWOCN, CNS-BC, pager [phone]

## 2020-04-27 NOTE — Progress Notes (Addendum)
Physical Therapy Treatment Patient Details Name: Bailey Walker MRN: [id number] DOB: [birth year] Today's Date: 04/27/2020    History of Present Illness Pt is a 74 y.o. F with significant PMH of hypertension, type 2 diabetes mellitus, obesity who presents with abdominal pain and vomiting. 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 Surgical pathology confirms adenocarcinoma. CT chest without evidence of metastatic disease noted. UA also positive for UTI.     PT Comments    Pt progressing well towards her physical therapy goals. Reports good pain control, mild nausea. Performing transfers at a supervision level, ambulating 200 feet with a walker and min guard assist. Will continue to progress as tolerated.   Follow Up Recommendations  Home health PT;Supervision for mobility/OOB     Equipment Recommendations  Rolling walker with 5" wheels (youth);3in1 (PT)    Recommendations for Other Services       Precautions / Restrictions Precautions Precautions: Fall;Other (comment) Precaution Comments: open abdominal wound, colostomy Restrictions Weight Bearing Restrictions: No    Mobility  Bed Mobility Overal bed mobility: Needs Assistance Bed Mobility: Sit to Supine       Sit to supine: Min assist   General bed mobility comments: MinA for LE assist back into bed  Transfers Overall transfer level: Needs assistance Equipment used: Rolling walker (2 wheeled) Transfers: Sit to/from Stand Sit to Stand: Supervision            Ambulation/Gait Ambulation/Gait assistance: Min guard Gait Distance (Feet): 200 Feet Assistive device: Rolling walker (2 wheeled) Gait Pattern/deviations: Step-through pattern;Decreased stride length Gait velocity: decreased   General Gait Details: Cues for walker proximity and activity pacing, min guard for safety. No overt LOB   Stairs             Wheelchair  Mobility    Modified Rankin (Stroke Patients Only)       Balance Overall balance assessment: Needs assistance Sitting-balance support: Feet supported Sitting balance-Leahy Scale: Good     Standing balance support: Single extremity supported;During functional activity Standing balance-Leahy Scale: Poor Standing balance comment: reliant on at least single UE support                            Cognition Arousal/Alertness: Awake/alert Behavior During Therapy: WFL for tasks assessed/performed Overall Cognitive Status: Within Functional Limits for tasks assessed                                        Exercises      General Comments        Pertinent Vitals/Pain Pain Assessment: Faces Faces Pain Scale: Hurts little more Pain Location: abdomen Pain Descriptors / Indicators: Grimacing;Guarding Pain Intervention(s): Monitored during session    Home Living                      Prior Function            PT Goals (current goals can now be found in the care plan section) Acute Rehab PT Goals Patient Stated Goal: go home with wound care/nursing PT Goal Formulation: With patient Time For Goal Achievement: 05/09/20 Potential to Achieve Goals: Good Progress towards PT goals: Progressing toward goals    Frequency    Min 3X/week      PT Plan Current plan remains appropriate  Co-evaluation              AM-PAC PT "6 Clicks" Mobility   Outcome Measure  Help needed turning from your back to your side while in a flat bed without using bedrails?: None Help needed moving from lying on your back to sitting on the side of a flat bed without using bedrails?: A Little Help needed moving to and from a bed to a chair (including a wheelchair)?: A Little Help needed standing up from a chair using your arms (e.g., wheelchair or bedside chair)?: A Little Help needed to walk in hospital room?: A Little Help needed climbing 3-5 steps with a  railing? : A Little 6 Click Score: 19    End of Session   Activity Tolerance: Patient tolerated treatment well Patient left: in bed;with call bell/phone within reach;with family/visitor present Nurse Communication: Mobility status;Patient requests pain meds PT Visit Diagnosis: Pain;Difficulty in walking, not elsewhere classified (R26.2) Pain - part of body:  (abdomen)     Time: 8115-7262 PT Time Calculation (min) (ACUTE ONLY): 16 min  Charges:  $Gait Training: 8-22 mins                       Wyona Almas, PT, DPT Acute Rehabilitation Services Pager [phone] Office [phone]    Deno Etienne 04/27/2020, 11:00 AM

## 2020-04-27 NOTE — Progress Notes (Signed)
7 Days Post-Op  Subjective: CC: Family in room and helps translate. Doing well. Tolerated an egg and 3 nutritional shakes yesterday. Some burping and nausea. No emesis. Continues to have good output from colostomy.   Objective: Vital signs in last 24 hours: Temp:  [98.2 F (36.8 C)-98.5 F (36.9 C)] 98.2 F (36.8 C) (08/06 0408) Pulse Rate:  [79-93] 93 (08/06 0408) Resp:  [16-18] 16 (08/06 0408) BP: (111-126)/(53-69) 126/69 (08/06 0408) SpO2:  [96 %-100 %] 97 % (08/06 0408) Last BM Date: 04/25/20  Intake/Output from previous day: 08/05 0701 - 08/06 0700 In: 2891.7 [P.O.:600; I.V.:1104.5; IV Piggyback:1187.2] Out: 401 [Stool:401] Intake/Output this shift: No intake/output data recorded.  PE: Gen: Alert, NAD, pleasant Pulm: rate and effort normal Abd: Soft,mild distension, midline woundas noted below. There is granulation tissue at the sides of the wound. At the proximal aspect of the wound there appears to be sloughing of fibrinous tissue. There are two areas of tunneling 3 and 5cmcm form the base of the wound that extends 2.5cm deep. At the proximal aspect of the wound there is a 2cm area of tunneling cephalad.There is notedblanchable erythema, with some heat and firmness that extends on the sides of midline woundb/l with significant improvement compared to yesterday. See pictures below. Less TTPepigastrium and LLQ without peritonitis.Colostomy bag in place with liquid brown stool and air in bag. Stoma unable to be visualized well past stool in bag. Yesterday was round, budded with some stoma necrosis at superior aspect Ext: No LE edema Skin:Other than as noted above,no rashes noted, warm and dry       Lab Results:  Recent Labs    04/26/20 0444 04/27/20 0835  WBC 9.6 8.5  HGB 9.4* 9.2*  HCT 30.3* 30.2*  PLT 339 374   BMET Recent Labs    04/26/20 0444 04/27/20 0835  NA 137 136  K 2.8* 4.3  CL 104 105  CO2 24 23  GLUCOSE 125* 112*  BUN <5* <5*    CREATININE 0.56 0.43*  CALCIUM 6.5* 6.5*   PT/INR No results for input(s): LABPROT, INR in the last 72 hours. CMP     Component Value Date/Time   NA 136 04/27/2020 0835   NA 139 07/12/2019 1110   K 4.3 04/27/2020 0835   CL 105 04/27/2020 0835   CO2 23 04/27/2020 0835   GLUCOSE 112 (H) 04/27/2020 0835   BUN <5 (L) 04/27/2020 0835   BUN 15 07/12/2019 1110   CREATININE 0.43 (L) 04/27/2020 0835   CREATININE 0.74 01/08/2017 1100   CALCIUM 6.5 (L) 04/27/2020 0835   PROT 5.5 (L) 04/23/2020 0256   PROT 7.1 02/11/2019 1455   ALBUMIN 2.6 (L) 04/23/2020 0256   ALBUMIN 4.4 02/11/2019 1455   AST 15 04/23/2020 0256   ALT 18 04/23/2020 0256   ALKPHOS 58 04/23/2020 0256   BILITOT 1.1 04/23/2020 0256   BILITOT <0.2 02/11/2019 1455   GFRNONAA >60 04/27/2020 0835   GFRNONAA 81 01/08/2017 1100   GFRAA >60 04/27/2020 0835   GFRAA >89 01/08/2017 1100   Lipase     Component Value Date/Time   LIPASE 19 04/16/2020 0042       Studies/Results: No results found.  Anti-infectives: Anti-infectives (From admission, onward)   Start     Dose/Rate Route Frequency Ordered Stop   04/25/20 1115  meropenem (MERREM) 1 g in sodium chloride 0.9 % 100 mL IVPB     Discontinue     1 g 200 mL/hr over 30  Minutes Intravenous Every 8 hours 04/25/20 1106     04/24/20 0915  doxycycline (VIBRA-TABS) tablet 100 mg  Status:  Discontinued        100 mg Oral Every 12 hours 04/24/20 0850 04/25/20 1106   04/20/20 1200  cefOXitin (MEFOXIN) 2 g in sodium chloride 0.9 % 100 mL IVPB        2 g 200 mL/hr over 30 Minutes Intravenous Every 8 hours 04/20/20 1130 04/20/20 1245   04/19/20 1800  cefOXitin (MEFOXIN) 2 g in sodium chloride 0.9 % 100 mL IVPB  Status:  Discontinued        2 g 200 mL/hr over 30 Minutes Intravenous Every 8 hours 04/19/20 1659 04/20/20 1130   04/17/20 0000  cefTRIAXone (ROCEPHIN) 1 g in sodium chloride 0.9 % 100 mL IVPB        1 g 200 mL/hr over 30 Minutes Intravenous  Once 04/16/20 1604  04/16/20 2347   04/16/20 0115  cefTRIAXone (ROCEPHIN) 1 g in sodium chloride 0.9 % 100 mL IVPB        1 g 200 mL/hr over 30 Minutes Intravenous  Once 04/16/20 0113 04/16/20 0435       Assessment/Plan HTN T2DM Hypothyroidism ABL anemia - Hgb 9.2, stable  Electrolyte abnormalities - Phos 1.0.   - Per TRH -   Adenocarcinoma of the colon  -s/p sigmoid colectomy and end colostomy by Dr. Ninfa Linden on 7/30 - POD #7 -Path with invasive adenocarcinoma with negative resection margins, metastatic carcinoma of 1/15 lymph notes and 1 satellite nodule.Pre-opCEA mildly elevated at 14. Dr. Marin Olp following. CT chest without metastasis. Med Onc saw 8/3 and will follow -Midline wound opened with evacuation of hematoma8/2. BID WTD dressing changes.Not a good candidate for vac with tunneling of wound. On IV abx for cellulitis.Continue, improving.  - WBC normalized. Tolerating diet - Mobilize - IS  ZVJ:KQAS, nutritional shakes VTE: SCDs, lovenox ID: rocephin x1. Doxy8/3- 8/4. Merrem 8/4 >>.Afebrile. WBC 8.5 Foley - Out Follow-Up - Dr. Ninfa Linden  Dispo - TOC continues to investigate options for discharge.    LOS: 11 days    Bailey Walker , [facility] 04/27/2020, 10:11 AM Please see Amion for pager number during day hours 7:00am-4:30pm

## 2020-04-27 NOTE — TOC Progression Note (Signed)
Transition of Care [facility]) - Progression Note    Patient Details  Name: Bailey Walker MRN: [id number] Date of Birth: 11-Feb-1946  Transition of Care [facility]) CM/SW [place], [place] Phone Number:  04/27/2020, 1:45 PM  Clinical Narrative:    CSW has faxed paperwork to [facility] (pt managed Medicaid) at [phone] with "urgent" noted on coversheet per [employer]'s request. Requested items were: HHRN for [place], [place] Aide, hospital bed, air mattress, bedside commode, chucks, colostomy bags. I provided pt niece Bailey Walker with a copy of this paperwork. CSW explained that family will need to still arrange private pay PCS/HHRN services until that is arranged through [facility]. Provided Bailey Walker with the two numbers for the two providers she has contacted already [facility] and [facility]. Provided encouragement to niece.   Also requested the above orders be placed, Legrand Como PA with above orders.    Expected Discharge Plan: [place] Barriers to Discharge: Continued Medical Work up, Inadequate or no insurance, Other (comment), No Home Care Agency will accept this patient (new colostomy/wound care needs)  Expected Discharge Plan and Services Expected Discharge Plan: [place] In-house Referral: Clinical Social Work Discharge Planning Services: CM Consult Post Acute Care Choice: Durable Medical Equipment Living arrangements for the past 2 months: Single Family Home                  Readmission Risk Interventions Readmission Risk Prevention Plan 04/25/2020  Transportation Screening Complete  PCP or Specialist Appt within 3-5 Days Not Complete  Not Complete comments pending disposition  HRI or Home Care Consult Complete  Social Work Consult for [place] Planning/Counseling Complete  Palliative Care Screening Not Applicable  Medication Review Press photographer) Referral to Pharmacy  Some recent data might be hidden

## 2020-04-27 NOTE — Progress Notes (Signed)
Nutrition Follow-up  DOCUMENTATION CODES:   Obesity unspecified  INTERVENTION:   -Continue MVI with minerals daily -Increase Ensure Enlive po to TID, each supplement provides 350 kcal and 20 grams of protein -Magic cup BID with meals, each supplement provides 290 kcal and 9 grams of protein -Hormel Shake daily with meals, each supplement provides 520 kcals and 22 grams protein  NUTRITION DIAGNOSIS:   Inadequate oral intake related to altered GI function as evidenced by NPO status.  Progressing; advanced to soft diet on 04/24/20  GOAL:   Patient will meet greater than or equal to 90% of their needs  Progressing   MONITOR:   PO intake, Supplement acceptance, Diet advancement, Labs, Weight trends, Skin, I & O's  REASON FOR ASSESSMENT:   Malnutrition Screening Tool    ASSESSMENT:   Bailey Walker is a 74 y.o. female with medical history significant of hypertension, type 2 diabetes mellitus, hypothyroidism, obesity presents to emergency department with abdominal pain and vomiting since 2 days  7/29- s/p flex sig- revealed complete obstruction of sigmoid colon (biopsied) 7/30- s/pProcedure(s): SIGMOID COLECTOMY ANDENDCOLOSTOMY 8/1- advanced to full liquid diet 8/3- biopsies revealed stage IIIb colon adenocarcinoma, advanced to soft diet  Reviewed I/O's: +2.5 L x 24 hours and +15.7 L since admission  Colostomy output: 401 ml x 24 hours  Attempted to speak with pt via phone call to hospital room phone, however, no answer.   MD requesting RD evaluate pt's intake for need for additional nutrition supplements. Over the past 48 hours, intake has declined. Noted pt consuming 5-25% of meals (versus about 50% previously). Pt is consuming Ensure supplements. She consumed only a boiled egg at breakfast.   Reviewed meal completion data- pt consumed 306 kcals and 12 grams protein off meal trays over the past 24 hours (19% of estimated kcal needs and 14% of estimated protein  needs). In addition to protein shakes, pt is consuming approximately 1006 kcals (63% of estimated kcal needs) and 52 grams protein (61% of estimated protein needs).   Per MD notes, plan home health vs SNF placement at discharge.   Labs reviewed: CBGS: 101-178 (inpatient orders for glycemic control are 0-20 units insulin aspart every 4 hours).   Diet Order:   Diet Order            DIET SOFT Room service appropriate? Yes; Fluid consistency: Thin  Diet effective now                 EDUCATION NEEDS:   No education needs have been identified at this time  Skin:  Skin Assessment: Skin Integrity Issues: Skin Integrity Issues:: Incisions Incisions: closed abdomen  Last BM:  04/24/20 (via colostomy)  Height:   Ht Readings from Last 1 Encounters:  04/19/20 5\' 1"  (1.549 m)    Weight:   Wt Readings from Last 1 Encounters:  04/19/20 72.6 kg    Ideal Body Weight:  47.7 kg  BMI:  Body mass index is 30.24 kg/m.  Estimated Nutritional Needs:   Kcal:  1600-1800  Protein:  85-100 grams  Fluid:  > 1.6 L    Loistine Chance, RD, LDN, [place] Registered Dietitian II Certified Diabetes Care and Education Specialist Please refer to [facility] for RD and/or RD on-call/weekend/after hours pager

## 2020-04-28 LAB — CBC WITH DIFFERENTIAL/PLATELET
Abs Immature Granulocytes: 0.13 10*3/uL — ABNORMAL HIGH (ref 0.00–0.07)
Basophils Absolute: 0.1 10*3/uL (ref 0.0–0.1)
Basophils Relative: 1 %
Eosinophils Absolute: 0.3 10*3/uL (ref 0.0–0.5)
Eosinophils Relative: 3 %
HCT: 30 % — ABNORMAL LOW (ref 36.0–46.0)
Hemoglobin: 9.4 g/dL — ABNORMAL LOW (ref 12.0–15.0)
Immature Granulocytes: 1 %
Lymphocytes Relative: 27 %
Lymphs Abs: 2.4 10*3/uL (ref 0.7–4.0)
MCH: 28.4 pg (ref 26.0–34.0)
MCHC: 31.3 g/dL (ref 30.0–36.0)
MCV: 90.6 fL (ref 80.0–100.0)
Monocytes Absolute: 0.8 10*3/uL (ref 0.1–1.0)
Monocytes Relative: 8 %
Neutro Abs: 5.5 10*3/uL (ref 1.7–7.7)
Neutrophils Relative %: 60 %
Platelets: 390 10*3/uL (ref 150–400)
RBC: 3.31 MIL/uL — ABNORMAL LOW (ref 3.87–5.11)
RDW: 15.3 % (ref 11.5–15.5)
WBC: 9.2 10*3/uL (ref 4.0–10.5)
nRBC: 0 % (ref 0.0–0.2)

## 2020-04-28 LAB — URINE CULTURE: Culture: NO GROWTH

## 2020-04-28 LAB — PHOSPHORUS: Phosphorus: 2.4 mg/dL — ABNORMAL LOW (ref 2.5–4.6)

## 2020-04-28 LAB — GLUCOSE, CAPILLARY
Glucose-Capillary: 105 mg/dL — ABNORMAL HIGH (ref 70–99)
Glucose-Capillary: 122 mg/dL — ABNORMAL HIGH (ref 70–99)
Glucose-Capillary: 146 mg/dL — ABNORMAL HIGH (ref 70–99)
Glucose-Capillary: 147 mg/dL — ABNORMAL HIGH (ref 70–99)
Glucose-Capillary: 148 mg/dL — ABNORMAL HIGH (ref 70–99)
Glucose-Capillary: 152 mg/dL — ABNORMAL HIGH (ref 70–99)
Glucose-Capillary: 176 mg/dL — ABNORMAL HIGH (ref 70–99)

## 2020-04-28 LAB — MAGNESIUM: Magnesium: 1.9 mg/dL (ref 1.7–2.4)

## 2020-04-28 NOTE — TOC Progression Note (Signed)
Transition of Care [facility]) - Progression Note    Patient Details  Name: Bailey Walker MRN: [id number] Date of Birth: 06/17/1946  Transition of Care [facility]) CM/SW [place], [place] Phone Number: 04/28/2020, 12:30 PM  Clinical Narrative:    CSW spoke with Bethena Roys at [facility] plan), we discussed requested needs. At this time she will be running a pre-certification for the following: HHRN and [place] as well as hospital bed. The following do not need precertification and can be ordered as needed by [facility] staff: Air mattress, bedside commode, chucks, colostomy supplies.   Confirmed with Bethena Roys that [facility] does not arrange [place] just provides pre-certification. At this time we do not any any agency willing to accept pt under Medicaid benefit/cannot staff the case. famiyl has already been provided with numbers to call for private pay caregivers/RN.   CSW will communicate this with RNCM for weekend who will be on tomorrow.    Expected Discharge Plan: [place] Barriers to Discharge: Continued Medical Work up, Inadequate or no insurance, Other (comment), No Home Care Agency will accept this patient (new colostomy/wound care needs)  Expected Discharge Plan and Services Expected Discharge Plan: [place] In-house Referral: Clinical Social Work Discharge Planning Services: CM Consult Post Acute Care Choice: Durable Medical Equipment Living arrangements for the past 2 months: Single Family Home  Readmission Risk Interventions Readmission Risk Prevention Plan 04/25/2020  Transportation Screening Complete  PCP or Specialist Appt within 3-5 Days Not Complete  Not Complete comments pending disposition  HRI or Home Care Consult Complete  Social Work Consult for [place] Planning/Counseling Complete  Palliative Care Screening Not Applicable  Medication Review Press photographer) Referral to Pharmacy  Some recent data might be hidden

## 2020-04-28 NOTE — Plan of Care (Signed)
  Problem: Activity: Goal: Risk for activity intolerance will decrease 04/28/2020 1723 by Sherrie Marsan, Aldona Bar, RN Outcome: Not Progressing Note: Patient is experiencing tremors during sitting and ambulation.

## 2020-04-28 NOTE — Progress Notes (Signed)
Pharmacy Antibiotic Note  Bailey Walker is a 74 y.o. female admitted on 04/15/2020 with abdominal pain found to have obstructing colorectal carcinoma now s/p sigmoid colectomy with end colostomy. Cellulitis initially diagnosed, but today, wound infection suspected, and pharmacy has been consulted for meropenem dosing.  Patient's WBC WNL and stable ~9, afebrile.  Patient has listed allergy to ampicillin. Upon conversation with patient via interpreter, patient describes allergic reaction as severe rash that occurred in the 1970s. Patient unable to remember more details, but stated that she was already in the hospital when the reaction occurred, and she had to get "a shot" to treat her rash because it was so bad. This admission, patient has received cephalosporins with no reaction noted.  Renal function has remained relatively stable, so no dose adjustment required at this time. Given clinical stability and no history of ESBL, consider deescalating to agents with coverage against enteric pathogens such as ceftriaxone and metronidazole. Main risk factors for ESBL would be hospitalization and immunosuppression but no cultures growing organisms at this time.  Plan: Continue meropenem 1g IV q8h Monitor  clinical picture and renal function F/U C&S, abx de-escalation, LOT   Height: 5\' 1"  (154.9 cm) Weight: 72.6 kg (160 lb 0.9 oz) IBW/kg (Calculated) : 47.8  Temp (24hrs), Avg:98 F (36.7 C), Min:97.5 F (36.4 C), Max:98.4 F (36.9 C)  Recent Labs  Lab 04/23/20 0256 04/23/20 0256 04/24/20 0144 04/25/20 0219 04/26/20 0444 04/27/20 0835 04/28/20 0301  WBC 11.4*   < > 8.8 10.6* 9.6 8.5 9.2  CREATININE 0.50  --  0.49 0.49 0.56 0.43*  --    < > = values in this interval not displayed.    Estimated Creatinine Clearance: 54.5 mL/min (A) (by C-G formula based on SCr of 0.43 mg/dL (L)).    Allergies  Allergen Reactions  . Nitroglycerin Nausea And Vomiting  . Ampicillin Other (See  Comments)    Per allergy test    Antimicrobials this admission: 7/26 CTX x1 7/29 cefoxitin> 7/30 8/3 doxy>8/4 8/4 meropenem>  Microbiology results: 7/26 UCx: recollect    Brendolyn Patty, PharmD Clinical Pharmacist  04/28/2020   10:06 AM   Please check AMION for all [place] phone numbers After 10:00 PM, call the [place] [phone]

## 2020-04-28 NOTE — Progress Notes (Addendum)
PROGRESS NOTE    Bailey Walker  TZG:[id number] DOB: 31-Dec-1945 DOA: 04/15/2020 PCP: [name], MD    Brief Narrative:  74 y.o.femalewith medical history significant ofhypertension, type 2 diabetes mellitus, hypothyroidism, obesity presents to emergency department with abdominal pain and vomiting since 2 days.  Patient speaks Arabic. History gathered from patient's daughter at the bedside. She mentioned that patient has severe generalized abdominal pain and vomiting since July 4 however it got worse since 2 days. She could not keep anything down, she has 10 out of 10, nonradiating, sharp pain, no aggravating or relieving factors, associated with vomiting 4 times since yesterday. Vomitus is nonbloody. She has lost about 7 to 10 pounds since July for the due to abdominal pain and vomiting. No history of colon cancer in the family. No night sweats, melena, fever, chills, headache, blurry vision, chest pain, shortness of breath, palpitation, leg swelling.  Reports dysuria since few days. No foul-smelling or change in urinary frequency, lower back pain. She had regular bowel movement this morning.  She is a former smoker. No history of alcohol, illicit drug use.  ED Course:Upon arrival to ED: Patient's vital signs stable. She received IV fluid bolus, Protonix, Zofran, Dilaudid as needed for pain control and Rocephin for possible UTI in ED. 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 Patient transferred to [facility] for further evaluation and management.    Consultants:   Surgery  Procedures:   Antimicrobials:   Meropenem   Subjective: Daughter at bedside who is interpreting for me.  Patient has no nausea, vomiting, mild abdominal pain.  Still complaining of dysuria.  No other complaints.   Objective: Vitals:   04/27/20 1334 04/27/20 1958 04/28/20 0424 04/28/20 1334  BP: (!) 123/57 123/60 127/60  136/67  Pulse: 78 82 81 86  Resp:  15 17 19   Temp:  98.4 F (36.9 C) (!) 97.5 F (36.4 C) 98 F (36.7 C)  TempSrc:  Oral  Oral  SpO2: 98% 100% 99% 98%  Weight:      Height:        Intake/Output Summary (Last 24 hours) at 04/28/2020 1455 Last data filed at 04/28/2020 1211 Gross per 24 hour  Intake 690 ml  Output 100 ml  Net 590 ml   Filed Weights   04/15/20 1956 04/16/20 1805 04/19/20 0840  Weight: 72.9 kg 72.6 kg 72.6 kg    Examination:  General exam: Sleepy this a.m., lying in bed, NAD Respiratory system: CTA, no wheeze rales rhonchi's  cardiovascular system: RRR S1-S2 no murmurs or gallops or clicks Gastrointestinal system: Mid abdomen dressing in place.  Left upper quadrant colostomy in place with liquidy stool Lower bilateral quadrants with mild pinkish erythema and warmth still mildly tender to touch Central nervous system: Awake and alert, grossly intact Extremities: No edema no cyanosis Skin: Warm and dry Psychiatry:  Mood & affect appropriate in current setting.     Data Reviewed: I have personally reviewed following labs and imaging studies  CBC: Recent Labs  Lab 04/24/20 0144 04/25/20 0219 04/26/20 0444 04/27/20 0835 04/28/20 0301  WBC 8.8 10.6* 9.6 8.5 9.2  NEUTROABS 6.2 7.8* 6.8 5.3 5.5  HGB 10.2* 9.5* 9.4* 9.2* 9.4*  HCT 32.1* 30.4* 30.3* 30.2* 30.0*  MCV 89.7 88.9 90.7 91.0 90.6  PLT 332 315 339 374 759   Basic Metabolic Panel: Recent Labs  Lab 04/23/20 0256 04/23/20 2003 04/24/20 0144 04/24/20 0144 04/25/20 0219 04/26/20 0444 04/26/20 2014 04/27/20 0835 04/28/20  0301  NA 133*  --  133*  --  134* 137  --  136  --   K 3.2*  --  3.0*  --  3.0* 2.8*  --  4.3  --   CL 103  --  102  --  101 104  --  105  --   CO2 22  --  22  --  21* 24  --  23  --   GLUCOSE 93  --  113*  --  115* 125*  --  112*  --   BUN 5*  --  <5*  --  <5* <5*  --  <5*  --   CREATININE 0.50  --  0.49  --  0.49 0.56  --  0.43*  --   CALCIUM 6.8*  --  6.7*  --  6.6* 6.5*   --  6.5*  --   MG 1.7  --  2.1  --  2.0 1.9  --  1.8 1.9  PHOS 1.3*   < > 2.0*   < > 1.3* 1.4* 1.0* 2.6 2.4*   < > = values in this interval not displayed.   GFR: Estimated Creatinine Clearance: 54.5 mL/min (A) (by C-G formula based on SCr of 0.43 mg/dL (L)). Liver Function Tests: Recent Labs  Lab 04/22/20 0334 04/23/20 0256  AST 22 15  ALT 26 18  ALKPHOS 49 58  BILITOT 0.6 1.1  PROT 5.3* 5.5*  ALBUMIN 2.7* 2.6*   No results for input(s): LIPASE, AMYLASE in the last 168 hours. No results for input(s): AMMONIA in the last 168 hours. Coagulation Profile: No results for input(s): INR, PROTIME in the last 168 hours. Cardiac Enzymes: No results for input(s): CKTOTAL, CKMB, CKMBINDEX, TROPONINI in the last 168 hours. BNP (last 3 results) No results for input(s): PROBNP in the last 8760 hours. HbA1C: No results for input(s): HGBA1C in the last 72 hours. CBG: Recent Labs  Lab 04/27/20 1955 04/28/20 0000 04/28/20 0420 04/28/20 0752 04/28/20 1215  GLUCAP 155* 148* 147* 105* 176*   Lipid Profile: No results for input(s): CHOL, HDL, LDLCALC, TRIG, CHOLHDL, LDLDIRECT in the last 72 hours. Thyroid Function Tests: Recent Labs    04/26/20 0444  FREET4 0.98   Anemia Panel: No results for input(s): VITAMINB12, FOLATE, FERRITIN, TIBC, IRON, RETICCTPCT in the last 72 hours. Sepsis Labs: No results for input(s): PROCALCITON, LATICACIDVEN in the last 168 hours.  Recent Results (from the past 240 hour(s))  Culture, Urine     Status: None   Collection Time: 04/27/20  3:44 PM   Specimen: Urine, Catheterized  Result Value Ref Range Status   Specimen Description URINE, CATHETERIZED  Final   Special Requests NONE  Final   Culture   Final    NO GROWTH Performed at [facility], [address] [place], [place] [address]    Report Status 04/28/2020 FINAL  Final         Radiology Studies: No results found.      Scheduled Meds: . aspirin EC  81 mg Oral Daily  .  Chlorhexidine Gluconate Cloth  6 each Topical Daily  . docusate sodium  100 mg Oral BID  . enoxaparin (LOVENOX) injection  40 mg Subcutaneous Q24H  . feeding supplement (ENSURE ENLIVE)  237 mL Oral TID BM  . insulin aspart  0-20 Units Subcutaneous Q4H  . levothyroxine  75 mcg Oral QAC breakfast  . lidocaine  1 patch Transdermal Q24H  . metoprolol tartrate  25 mg Oral BID  . multivitamin with minerals  1 tablet Oral Daily  . pantoprazole  40 mg Oral Daily  . phosphorus  500 mg Oral TID  . pneumococcal 23 valent vaccine  0.5 mL Intramuscular Tomorrow-1000  . potassium chloride  40 mEq Oral BID   Continuous Infusions: . lactated ringers 20 mL/hr at 04/18/20 0940  . meropenem (MERREM) IV 1 g (04/28/20 0653)  . 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 -continue IVF hydration for now -Appreciate assistance by GI.  Gastritis noted on EGD. Flex sig now confirming obstructing colonic mass -Appreciate General Surgery assistance.s/p sigmoid colectomy and end colostomy by Dr. 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 Pre-opCEA mildly elevated at 14. Dr. Marin Olp following. CT chest without metastasis. Med Onc saw 8/3 and  will follow -Advancing diet per general surgery. Empiric doxycycline started 8/3 by general surgery----Midline wound opened with evacuation of hematoma 8/2. BID WTD dressing changes.Not a good candidate for vac with tunneling of wound.   may need CT in 1-2 days. ..>will d/w Gsx -CT chest without evidence of metastatic disease noted -Oncology following, likely plan for chemo once surgical incision heals Cellulitis improving but still erythematosus, warm requiring IV antibiotics. Will continue on IV meropenem Continue taking small meals nongreasy and encourage several shakes a day per surgery's recommendation    UTI-patient reported dysuria. UA is positive for leukocyte/bacteria/WBC -She received Rocephin in ED. Completed empiric course of rocephin.  Urine culture results are indeterminate.  8/6 patient complaining of dysuria again. UA negative. ucx pending. Possibly abdominal pressure causing sx? Also on meropenem for abd cellulitis which would tx uti too  Hypertension: Stable continue with metoprolol  Hypothyroidism:  TSH elevated at 15.312  Free T4 normal Continue levothyroxine. Will need repeat thyroid labs in 4 weeks after her current medical issues improves   Type 2 diabetes mellitus: Hold glipizide and Metformin. Blood glucose levels stable  Continue R-ISS, check fingerstick, hypoglycemic protocol   Iron deficiency anemia: H&H is stable at this time -Continue ferrous sulfate ? I dont see this in mar. Will d/w pharm -Followed by hematology as outpatient Dr. Alvy Bimler Continue checking CBC periodically Hypokalemia/Hypophosh.- Replaced now stable.  K is 4.3 Magnesium 1.9 Phosphate was replaced today is 2.4 Continue to monitor labs and replace as needed     DVT prophylaxis: Lovenox subq Code Status: Full Family Communication:  Daughter updated at bedside  Status is: Inpatient  Remains inpatient appropriate because:IV treatments appropriate due to intensity  of illness or inability to take PO   Dispo: The patient is from: Home              Anticipated d/c is to: Home versus SNF              Anticipated d/c date is: 2 days  Patient currently is not medically stable to d/c.  Continue needing IV antibiotics needs to be cleared from surgical standpoint and cellulitis improving prior to discharge            LOS: 12 days   Time spent: 35 minutes with more than 50% on [place], MD [facility] Pager [phone]  If 7PM-7AM, please contact night-coverage [email or web address] Password [email or web address] 04/28/2020, 2:55 PM

## 2020-04-28 NOTE — Progress Notes (Signed)
Patient ID: Bailey Walker, female   DOB: 1946-05-11, 74 y.o.   MRN: [id number] [facility] Surgery Progress Note:   8 Days Post-Op  Subjective: Mental status is OK-family member translates.  Complaints none. Objective: Vital signs in last 24 hours: Temp:  [97.5 F (36.4 C)-98.4 F (36.9 C)] 97.5 F (36.4 C) (08/07 0424) Pulse Rate:  [78-82] 81 (08/07 0424) Resp:  [15-17] 17 (08/07 0424) BP: (123-127)/(57-60) 127/60 (08/07 0424) SpO2:  [98 %-100 %] 99 % (08/07 0424)  Intake/Output from previous day: 08/06 0701 - 08/07 0700 In: 720 [P.O.:720] Out: 100 [Stool:100] Intake/Output this shift: No intake/output data recorded.  Physical Exam: Work of breathing is normal.  Wound is being repacked saline wet-dry  Lab Results:  Results for orders placed or performed during the hospital encounter of 04/15/20 (from the past 48 hour(s))  Glucose, capillary     Status: Abnormal   Collection Time: 04/26/20 11:55 AM  Result Value Ref Range   Glucose-Capillary 139 (H) 70 - 99 mg/dL    Comment: Glucose reference range applies only to samples taken after fasting for at least 8 hours.  Glucose, capillary     Status: Abnormal   Collection Time: 04/26/20  4:13 PM  Result Value Ref Range   Glucose-Capillary 179 (H) 70 - 99 mg/dL    Comment: Glucose reference range applies only to samples taken after fasting for at least 8 hours.  phosphorus level     Status: Abnormal   Collection Time: 04/26/20  8:14 PM  Result Value Ref Range   Phosphorus 1.0 (LL) 2.5 - 4.6 mg/dL    Comment: CRITICAL RESULT CALLED TO, READ BACK BY AND VERIFIED WITH: Lenetta Quaker RN [id number] Sander Radon Performed at [facility], [place] [address], [place], [place] [address]   Glucose, capillary     Status: Abnormal   Collection Time: 04/26/20  8:57 PM  Result Value Ref Range   Glucose-Capillary 150 (H) 70 - 99 mg/dL    Comment: Glucose reference range applies only to samples taken after fasting for at least 8  hours.  Glucose, capillary     Status: Abnormal   Collection Time: 04/26/20 11:27 PM  Result Value Ref Range   Glucose-Capillary 115 (H) 70 - 99 mg/dL    Comment: Glucose reference range applies only to samples taken after fasting for at least 8 hours.  Glucose, capillary     Status: Abnormal   Collection Time: 04/27/20  4:10 AM  Result Value Ref Range   Glucose-Capillary 115 (H) 70 - 99 mg/dL    Comment: Glucose reference range applies only to samples taken after fasting for at least 8 hours.  Magnesium     Status: None   Collection Time: 04/27/20  8:35 AM  Result Value Ref Range   Magnesium 1.8 1.7 - 2.4 mg/dL    Comment: Performed at [facility], [place] [address], [place], [place] [address]  Phosphorus     Status: None   Collection Time: 04/27/20  8:35 AM  Result Value Ref Range   Phosphorus 2.6 2.5 - 4.6 mg/dL    Comment: Performed at [facility] [address], [place], [place] [address]  CBC with Differential/Platelet     Status: Abnormal   Collection Time: 04/27/20  8:35 AM  Result Value Ref Range   WBC 8.5 4.0 - 10.5 K/uL   RBC 3.32 (L) 3.87 - 5.11 MIL/uL   Hemoglobin 9.2 (L) 12.0 - 15.0  g/dL   HCT 30.2 (L) 36 - 46 %   MCV 91.0 80.0 - 100.0 fL   MCH 27.7 26.0 - 34.0 pg   MCHC 30.5 30.0 - 36.0 g/dL   RDW 15.2 11.5 - 15.5 %   Platelets 374 150 - 400 K/uL   nRBC 0.0 0.0 - 0.2 %   Neutrophils Relative % 61 %   Neutro Abs 5.3 1.7 - 7.7 K/uL   Lymphocytes Relative 24 %   Lymphs Abs 2.0 0.7 - 4.0 K/uL   Monocytes Relative 10 %   Monocytes Absolute 0.8 0 - 1 K/uL   Eosinophils Relative 3 %   Eosinophils Absolute 0.2 0 - 0 K/uL   Basophils Relative 1 %   Basophils Absolute 0.1 0 - 0 K/uL   Immature Granulocytes 1 %   Abs Immature Granulocytes 0.11 (H) 0.00 - 0.07 K/uL    Comment: Performed at [facility] [address], [place], [place] [address]  Basic metabolic panel     Status: Abnormal   Collection Time: 04/27/20  8:35 AM  Result Value Ref  Range   Sodium 136 135 - 145 mmol/L   Potassium 4.3 3.5 - 5.1 mmol/L   Chloride 105 98 - 111 mmol/L   CO2 23 22 - 32 mmol/L   Glucose, Bld 112 (H) 70 - 99 mg/dL    Comment: Glucose reference range applies only to samples taken after fasting for at least 8 hours.   BUN <5 (L) 8 - 23 mg/dL   Creatinine, Ser 0.43 (L) 0.44 - 1.00 mg/dL   Calcium 6.5 (L) 8.9 - 10.3 mg/dL   GFR calc non Af Amer >60 >60 mL/min   GFR calc Af Amer >60 >60 mL/min   Anion gap 8 5 - 15    Comment: Performed at [facility] [address], [place], [place] [address]  Glucose, capillary     Status: Abnormal   Collection Time: 04/27/20  9:01 AM  Result Value Ref Range   Glucose-Capillary 101 (H) 70 - 99 mg/dL    Comment: Glucose reference range applies only to samples taken after fasting for at least 8 hours.  Glucose, capillary     Status: Abnormal   Collection Time: 04/27/20 11:57 AM  Result Value Ref Range   Glucose-Capillary 178 (H) 70 - 99 mg/dL    Comment: Glucose reference range applies only to samples taken after fasting for at least 8 hours.  Glucose, capillary     Status: None   Collection Time: 04/27/20  4:24 PM  Result Value Ref Range   Glucose-Capillary 85 70 - 99 mg/dL    Comment: Glucose reference range applies only to samples taken after fasting for at least 8 hours.  Urinalysis, Routine w reflex microscopic Urine, Catheterized     Status: Abnormal   Collection Time: 04/27/20  5:44 PM  Result Value Ref Range   Color, Urine STRAW (A) YELLOW   APPearance CLEAR CLEAR   Specific Gravity, Urine 1.010 1.005 - 1.030   pH 7.0 5.0 - 8.0   Glucose, UA NEGATIVE NEGATIVE mg/dL   Hgb urine dipstick NEGATIVE NEGATIVE   Bilirubin Urine NEGATIVE NEGATIVE   Ketones, ur NEGATIVE NEGATIVE mg/dL   Protein, ur NEGATIVE NEGATIVE mg/dL   Nitrite NEGATIVE NEGATIVE   Leukocytes,Ua NEGATIVE NEGATIVE    Comment: Performed at [facility] [address] [place], [place] [address]  Glucose, capillary      Status: Abnormal  Collection Time: 04/27/20  7:55 PM  Result Value Ref Range   Glucose-Capillary 155 (H) 70 - 99 mg/dL    Comment: Glucose reference range applies only to samples taken after fasting for at least 8 hours.  Glucose, capillary     Status: Abnormal   Collection Time: 04/28/20 12:00 AM  Result Value Ref Range   Glucose-Capillary 148 (H) 70 - 99 mg/dL    Comment: Glucose reference range applies only to samples taken after fasting for at least 8 hours.  Magnesium     Status: None   Collection Time: 04/28/20  3:01 AM  Result Value Ref Range   Magnesium 1.9 1.7 - 2.4 mg/dL    Comment: Performed at [facility] [address] [place], [place] [address]  Phosphorus     Status: Abnormal   Collection Time: 04/28/20  3:01 AM  Result Value Ref Range   Phosphorus 2.4 (L) 2.5 - 4.6 mg/dL    Comment: Performed at [facility] [address], [place], [place] [address]  CBC with Differential/Platelet     Status: Abnormal   Collection Time: 04/28/20  3:01 AM  Result Value Ref Range   WBC 9.2 4.0 - 10.5 K/uL   RBC 3.31 (L) 3.87 - 5.11 MIL/uL   Hemoglobin 9.4 (L) 12.0 - 15.0 g/dL   HCT 30.0 (L) 36 - 46 %   MCV 90.6 80.0 - 100.0 fL   MCH 28.4 26.0 - 34.0 pg   MCHC 31.3 30.0 - 36.0 g/dL   RDW 15.3 11.5 - 15.5 %   Platelets 390 150 - 400 K/uL   nRBC 0.0 0.0 - 0.2 %   Neutrophils Relative % 60 %   Neutro Abs 5.5 1.7 - 7.7 K/uL   Lymphocytes Relative 27 %   Lymphs Abs 2.4 0.7 - 4.0 K/uL   Monocytes Relative 8 %   Monocytes Absolute 0.8 0 - 1 K/uL   Eosinophils Relative 3 %   Eosinophils Absolute 0.3 0 - 0 K/uL   Basophils Relative 1 %   Basophils Absolute 0.1 0 - 0 K/uL   Immature Granulocytes 1 %   Abs Immature Granulocytes 0.13 (H) 0.00 - 0.07 K/uL    Comment: Performed at [facility] [address] [place], [place] [address]  Glucose, capillary     Status: Abnormal   Collection Time: 04/28/20  4:20 AM  Result Value Ref Range   Glucose-Capillary 147  (H) 70 - 99 mg/dL    Comment: Glucose reference range applies only to samples taken after fasting for at least 8 hours.  Glucose, capillary     Status: Abnormal   Collection Time: 04/28/20  7:52 AM  Result Value Ref Range   Glucose-Capillary 105 (H) 70 - 99 mg/dL    Comment: Glucose reference range applies only to samples taken after fasting for at least 8 hours.    Radiology/Results: No results found.  Anti-infectives: Anti-infectives (From admission, onward)   Start     Dose/Rate Route Frequency Ordered Stop   04/25/20 1115  meropenem (MERREM) 1 g in sodium chloride 0.9 % 100 mL IVPB     Discontinue     1 g 200 mL/hr over 30 Minutes Intravenous Every 8 hours 04/25/20 1106     04/24/20 0915  doxycycline (VIBRA-TABS) tablet 100 mg  Status:  Discontinued        100 mg Oral Every 12 hours 04/24/20 0850 04/25/20 1106   04/20/20 1200  cefOXitin (MEFOXIN) 2 g in sodium chloride 0.9 % 100 mL IVPB        2 g 200 mL/hr over 30 Minutes Intravenous Every 8 hours 04/20/20 1130 04/20/20 1245   04/19/20 1800  cefOXitin (MEFOXIN) 2 g in sodium chloride 0.9 % 100 mL IVPB  Status:  Discontinued        2 g 200 mL/hr over 30 Minutes Intravenous Every 8 hours 04/19/20 1659 04/20/20 1130   04/17/20 0000  cefTRIAXone (ROCEPHIN) 1 g in sodium chloride 0.9 % 100 mL IVPB        1 g 200 mL/hr over 30 Minutes Intravenous  Once 04/16/20 1604 04/16/20 2347   04/16/20 0115  cefTRIAXone (ROCEPHIN) 1 g in sodium chloride 0.9 % 100 mL IVPB        1 g 200 mL/hr over 30 Minutes Intravenous  Once 04/16/20 0113 04/16/20 0435      Assessment/Plan: Problem List: Patient Active Problem List   Diagnosis Date Noted   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 ([place]) 04/26/2020   Goals of care, counseling/discussion 04/26/2020   Gastritis and gastroduodenitis    Grade II internal hemorrhoids    Abdominal distension    Nausea and vomiting in adult    Abnormal CT scan, gastrointestinal  tract    Colon obstruction ([place]) 04/16/2020   UTI (urinary tract infection) 04/16/2020   Dyspnea on exertion 01/27/2020   Iron deficiency anemia 01/26/2020   Vitamin B12 deficiency anemia 01/26/2020   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  Long term out of hospital care arrangements pending.  8 Days Post-Op    LOS: 12 days   Matt B. Hassell Done, MD, [facility] [phone] to reach the surgeon on call.    04/28/2020 8:46 AM

## 2020-04-28 NOTE — Evaluation (Signed)
Occupational Therapy Evaluation Patient Details Name: Bailey Walker MRN: [id number] DOB: March 16, 1946 Today's Date: 04/28/2020    History of Present Illness Pt is a 74 y.o. F with significant PMH of hypertension, type 2 diabetes mellitus, obesity who presents with abdominal pain and vomiting. 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 Surgical pathology confirms adenocarcinoma. CT chest without evidence of metastatic disease noted. UA also positive for UTI.    Clinical Impression   Pt s/p sigmoid colectomy with end colostomy. Pt currently with functional limitations due to the deficits listed below (see OT Problem List). Pt and pt's niece presented in room and they interpreted at this time. Pt at this time limited by fatigue as reported they have been up almost every hour. Pt was able to ambulate from bathroom with RW and supervision. Pt completed bed mobility sitting to supine with min guard and HOB elevated. Pt's niece reported they are not sure when they will have to return to work but it will be soon and they work 12 hour shifts. Pt at this time needs supervision at all times. Pt will benefit from skilled OT to increase their safety and independence with ADL and functional mobility for ADL to facilitate discharge to venue listed below.       Follow Up Recommendations  Home health OT;Supervision/Assistance - 24 hour (( pt's niece reporting they will not go to rehab))    Equipment Recommendations  3 in 1 bedside commode;Tub/shower seat    Recommendations for Other Services       Precautions / Restrictions Precautions Precautions: Fall;Other (comment) Precaution Comments: open abdominal wound, colostomy Restrictions Weight Bearing Restrictions: No      Mobility Bed Mobility Overal bed mobility: Needs Assistance Bed Mobility: Sit to Supine       Sit to supine: Min guard       Transfers Overall transfer level: Needs assistance Equipment used: Rolling walker (2 wheeled) Transfers: Sit to/from Stand Sit to Stand: Supervision         General transfer comment: Min guard to rise to stand    Balance Overall balance assessment: Needs assistance Sitting-balance support: Feet supported Sitting balance-Leahy Scale: Good                                     ADL either performed or assessed with clinical judgement   ADL Overall ADL's : Needs assistance/impaired Eating/Feeding: Independent;Sitting   Grooming: Wash/dry hands;Wash/dry face;Supervision/safety;Sitting;Standing   Upper Body Bathing: Minimal assistance;Sitting   Lower Body Bathing: Moderate assistance;Sit to/from stand   Upper Body Dressing : Minimal assistance;Sitting;Standing   Lower Body Dressing: Moderate assistance;Cueing for safety;Cueing for sequencing;Sit to/from stand   Toilet Transfer: Min guard;Cueing for safety;Cueing for sequencing;Ambulation;RW   Toileting- Clothing Manipulation and Hygiene: Supervision/safety;Cueing for safety;Cueing for sequencing;Sit to/from stand   Tub/ Banker: Minimal assistance   Functional mobility during ADLs: Min guard;Cueing for safety;Cueing for sequencing       Vision Patient Visual Report: No change from baseline Vision Assessment?: No apparent visual deficits     Perception Perception Perception Tested?: No   Praxis Praxis Praxis tested?: Not tested    Pertinent Vitals/Pain Pain Assessment: 0-10 Pain Score: 0-No pain     Hand Dominance Right   Extremity/Trunk Assessment Upper Extremity Assessment Upper Extremity Assessment: Overall WFL for tasks assessed   Lower Extremity Assessment Lower Extremity Assessment: Defer  to PT evaluation       Communication Communication Communication: Prefers language other than English   Cognition Arousal/Alertness: Awake/alert Behavior During Therapy: WFL for tasks  assessed/performed Overall Cognitive Status: Within Functional Limits for tasks assessed                                     General Comments       Exercises     Shoulder Instructions      Home Living Family/patient expects to be discharged to:: Private residence Living Arrangements: Other relatives Available Help at Discharge: Family;Available PRN/intermittently Type of Home: House Home Access: Stairs to enter [employer] of Steps: 4 Entrance Stairs-Rails: None Home Layout: One level     Bathroom Shower/Tub: Tub/shower unit;Walk-in shower   Bathroom Toilet: Standard Bathroom Accessibility: Yes How Accessible: Accessible via walker Home Equipment: None   Additional Comments: no grab bars or shower seat      Prior Functioning/Environment Level of Independence: Needs assistance  Gait / Transfers Assistance Needed: household ambulator, denies recent falls ADL's / Homemaking Assistance Needed: assist bathing, dressing            OT Problem List: Decreased strength;Decreased range of motion;Decreased activity tolerance;Impaired balance (sitting and/or standing);Decreased safety awareness;Decreased knowledge of use of DME or AE;Pain      OT Treatment/Interventions: Self-care/ADL training;Therapeutic exercise;DME and/or AE instruction;Therapeutic activities;Patient/family education;Balance training    OT Goals(Current goals can be found in the care plan section) Acute Rehab OT Goals Patient Stated Goal: go home with wound care/nursing OT Goal Formulation: With patient Time For Goal Achievement: 05/19/20 Potential to Achieve Goals: Good ADL Goals Pt Will Perform Lower Body Bathing: with supervision;sit to/from stand Pt Will Transfer to Toilet: with modified independence;ambulating;regular height toilet Pt Will Perform Tub/Shower Transfer: with supervision;shower seat;rolling walker  OT Frequency: Min 2X/week   Barriers to D/C: Decreased  caregiver support          Co-evaluation              AM-PAC OT "6 Clicks" Daily Activity     Outcome Measure Help from another person eating meals?: None Help from another person taking care of personal grooming?: A Little Help from another person toileting, which includes using toliet, bedpan, or urinal?: A Little Help from another person bathing (including washing, rinsing, drying)?: A Lot Help from another person to put on and taking off regular upper body clothing?: A Little Help from another person to put on and taking off regular lower body clothing?: A Lot 6 Click Score: 17   End of Session Equipment Utilized During Treatment: Gait belt;Rolling walker  Activity Tolerance: Patient tolerated treatment well Patient left: in bed;with call bell/phone within reach;with family/visitor present  OT Visit Diagnosis: Unsteadiness on feet (R26.81);Other abnormalities of gait and mobility (R26.89);Repeated falls (R29.6);Muscle weakness (generalized) (M62.81);Pain                Time: 7893-8101 OT Time Calculation (min): 16 min Charges:  OT General Charges $OT Visit: 1 Visit OT Evaluation $OT Eval Low Complexity: [place] OTR/L  Acute Rehab Services  [phone] office number [phone] pager number   Joeseph Amor 04/28/2020, 9:48 AM

## 2020-04-29 LAB — BASIC METABOLIC PANEL
Anion gap: 9 (ref 5–15)
BUN: 5 mg/dL — ABNORMAL LOW (ref 8–23)
CO2: 24 mmol/L (ref 22–32)
Calcium: 7.6 mg/dL — ABNORMAL LOW (ref 8.9–10.3)
Chloride: 103 mmol/L (ref 98–111)
Creatinine, Ser: 0.43 mg/dL — ABNORMAL LOW (ref 0.44–1.00)
GFR calc Af Amer: >60 mL/min (ref >60)
GFR calc non Af Amer: >60 mL/min (ref >60)
Glucose, Bld: 116 mg/dL — ABNORMAL HIGH (ref 70–99)
Potassium: 4.7 mmol/L (ref 3.5–5.1)
Sodium: 136 mmol/L (ref 135–145)

## 2020-04-29 LAB — MAGNESIUM: Magnesium: 2 mg/dL (ref 1.7–2.4)

## 2020-04-29 LAB — CBC WITH DIFFERENTIAL/PLATELET
Abs Immature Granulocytes: 0.09 10*3/uL — ABNORMAL HIGH (ref 0.00–0.07)
Basophils Absolute: 0.1 10*3/uL (ref 0.0–0.1)
Basophils Relative: 1 %
Eosinophils Absolute: 0.2 10*3/uL (ref 0.0–0.5)
Eosinophils Relative: 2 %
HCT: 31.3 % — ABNORMAL LOW (ref 36.0–46.0)
Hemoglobin: 9.5 g/dL — ABNORMAL LOW (ref 12.0–15.0)
Immature Granulocytes: 1 %
Lymphocytes Relative: 30 %
Lymphs Abs: 2.7 10*3/uL (ref 0.7–4.0)
MCH: 27.3 pg (ref 26.0–34.0)
MCHC: 30.4 g/dL (ref 30.0–36.0)
MCV: 89.9 fL (ref 80.0–100.0)
Monocytes Absolute: 0.9 10*3/uL (ref 0.1–1.0)
Monocytes Relative: 10 %
Neutro Abs: 5.2 10*3/uL (ref 1.7–7.7)
Neutrophils Relative %: 56 %
Platelets: 431 10*3/uL — ABNORMAL HIGH (ref 150–400)
RBC: 3.48 MIL/uL — ABNORMAL LOW (ref 3.87–5.11)
RDW: 15.4 % (ref 11.5–15.5)
WBC: 9.1 10*3/uL (ref 4.0–10.5)
nRBC: 0 % (ref 0.0–0.2)

## 2020-04-29 LAB — GLUCOSE, CAPILLARY
Glucose-Capillary: 103 mg/dL — ABNORMAL HIGH (ref 70–99)
Glucose-Capillary: 107 mg/dL — ABNORMAL HIGH (ref 70–99)
Glucose-Capillary: 121 mg/dL — ABNORMAL HIGH (ref 70–99)
Glucose-Capillary: 137 mg/dL — ABNORMAL HIGH (ref 70–99)
Glucose-Capillary: 152 mg/dL — ABNORMAL HIGH (ref 70–99)
Glucose-Capillary: 160 mg/dL — ABNORMAL HIGH (ref 70–99)

## 2020-04-29 LAB — PHOSPHORUS: Phosphorus: 3.3 mg/dL (ref 2.5–4.6)

## 2020-04-29 MED ORDER — INSULIN ASPART 100 UNIT/ML ~~LOC~~ SOLN
0.0000 [IU] | Freq: Three times a day (TID) | SUBCUTANEOUS | Status: DC
  Administered 2020-04-30 – 2020-05-01 (×3): 3 [IU] via SUBCUTANEOUS
  Administered 2020-05-02: 4 [IU] via SUBCUTANEOUS

## 2020-04-29 NOTE — Plan of Care (Signed)
  Problem: Clinical Measurements: Goal: Ability to maintain clinical measurements within normal limits will improve Outcome: Progressing   Problem: Activity: Goal: Risk for activity intolerance will decrease Outcome: Progressing   Problem: Nutrition: Goal: Adequate nutrition will be maintained Outcome: Progressing   Problem: Coping: Goal: Level of anxiety will decrease Outcome: Progressing   

## 2020-04-29 NOTE — Plan of Care (Signed)

## 2020-04-29 NOTE — Progress Notes (Signed)
Patient ID: Bailey Walker, female   DOB: 1945/11/19, 74 y.o.   MRN: [id number] [facility] Surgery Progress Note:   9 Days Post-Op  Subjective: Mental status is alert and interviewed with help of family member.  Complaints none. Objective: Vital signs in last 24 hours: Temp:  [97.7 F (36.5 C)-98 F (36.7 C)] 97.7 F (36.5 C) (08/07 2006) Pulse Rate:  [86-91] 86 (08/08 0431) Resp:  [16-19] 16 (08/08 0431) BP: (122-145)/(67-88) 145/79 (08/08 0431) SpO2:  [98 %-100 %] 100 % (08/08 0431) Weight:  [73.5 kg] 73.5 kg (08/08 0500)  Intake/Output from previous day: 08/07 0701 - 08/08 0700 In: 1050 [P.O.:800; IV Piggyback:250] Out: 200 [Stool:200] Intake/Output this shift: No intake/output data recorded.  Physical Exam: Work of breathing is normal.  Ostomy with good semiformed output that is dark green.  Wound dressed  Lab Results:  Results for orders placed or performed during the hospital encounter of 04/15/20 (from the past 48 hour(s))  Glucose, capillary     Status: Abnormal   Collection Time: 04/27/20 11:57 AM  Result Value Ref Range   Glucose-Capillary 178 (H) 70 - 99 mg/dL    Comment: Glucose reference range applies only to samples taken after fasting for at least 8 hours.  Culture, Urine     Status: None   Collection Time: 04/27/20  3:44 PM   Specimen: Urine, Catheterized  Result Value Ref Range   Specimen Description URINE, CATHETERIZED    Special Requests NONE    Culture      NO GROWTH Performed at [facility], [address], [place], [place] [address]    Report Status 04/28/2020 FINAL   Glucose, capillary     Status: None   Collection Time: 04/27/20  4:24 PM  Result Value Ref Range   Glucose-Capillary 85 70 - 99 mg/dL    Comment: Glucose reference range applies only to samples taken after fasting for at least 8 hours.  Urinalysis, Routine w reflex microscopic Urine, Catheterized     Status: Abnormal   Collection Time: 04/27/20  5:44 PM  Result  Value Ref Range   Color, Urine STRAW (A) YELLOW   APPearance CLEAR CLEAR   Specific Gravity, Urine 1.010 1.005 - 1.030   pH 7.0 5.0 - 8.0   Glucose, UA NEGATIVE NEGATIVE mg/dL   Hgb urine dipstick NEGATIVE NEGATIVE   Bilirubin Urine NEGATIVE NEGATIVE   Ketones, ur NEGATIVE NEGATIVE mg/dL   Protein, ur NEGATIVE NEGATIVE mg/dL   Nitrite NEGATIVE NEGATIVE   Leukocytes,Ua NEGATIVE NEGATIVE    Comment: Performed at [facility] [address], [place], [place] [address]  Glucose, capillary     Status: Abnormal   Collection Time: 04/27/20  7:55 PM  Result Value Ref Range   Glucose-Capillary 155 (H) 70 - 99 mg/dL    Comment: Glucose reference range applies only to samples taken after fasting for at least 8 hours.  Glucose, capillary     Status: Abnormal   Collection Time: 04/28/20 12:00 AM  Result Value Ref Range   Glucose-Capillary 148 (H) 70 - 99 mg/dL    Comment: Glucose reference range applies only to samples taken after fasting for at least 8 hours.  Magnesium     Status: None   Collection Time: 04/28/20  3:01 AM  Result Value Ref Range   Magnesium 1.9 1.7 - 2.4 mg/dL    Comment: Performed at [facility] [address], [place], [place] [address]  Phosphorus  Status: Abnormal   Collection Time: 04/28/20  3:01 AM  Result Value Ref Range   Phosphorus 2.4 (L) 2.5 - 4.6 mg/dL    Comment: Performed at [facility] [address] [place], [place] [address]  CBC with Differential/Platelet     Status: Abnormal   Collection Time: 04/28/20  3:01 AM  Result Value Ref Range   WBC 9.2 4.0 - 10.5 K/uL   RBC 3.31 (L) 3.87 - 5.11 MIL/uL   Hemoglobin 9.4 (L) 12.0 - 15.0 g/dL   HCT 30.0 (L) 36 - 46 %   MCV 90.6 80.0 - 100.0 fL   MCH 28.4 26.0 - 34.0 pg   MCHC 31.3 30.0 - 36.0 g/dL   RDW 15.3 11.5 - 15.5 %   Platelets 390 150 - 400 K/uL   nRBC 0.0 0.0 - 0.2 %   Neutrophils Relative % 60 %   Neutro Abs 5.5 1.7 - 7.7 K/uL   Lymphocytes Relative 27 %   Lymphs Abs  2.4 0.7 - 4.0 K/uL   Monocytes Relative 8 %   Monocytes Absolute 0.8 0 - 1 K/uL   Eosinophils Relative 3 %   Eosinophils Absolute 0.3 0 - 0 K/uL   Basophils Relative 1 %   Basophils Absolute 0.1 0 - 0 K/uL   Immature Granulocytes 1 %   Abs Immature Granulocytes 0.13 (H) 0.00 - 0.07 K/uL    Comment: Performed at [facility] [address], [place], [place] [address]  Glucose, capillary     Status: Abnormal   Collection Time: 04/28/20  4:20 AM  Result Value Ref Range   Glucose-Capillary 147 (H) 70 - 99 mg/dL    Comment: Glucose reference range applies only to samples taken after fasting for at least 8 hours.  Glucose, capillary     Status: Abnormal   Collection Time: 04/28/20  7:52 AM  Result Value Ref Range   Glucose-Capillary 105 (H) 70 - 99 mg/dL    Comment: Glucose reference range applies only to samples taken after fasting for at least 8 hours.  Glucose, capillary     Status: Abnormal   Collection Time: 04/28/20 12:15 PM  Result Value Ref Range   Glucose-Capillary 176 (H) 70 - 99 mg/dL    Comment: Glucose reference range applies only to samples taken after fasting for at least 8 hours.  Glucose, capillary     Status: Abnormal   Collection Time: 04/28/20  4:20 PM  Result Value Ref Range   Glucose-Capillary 122 (H) 70 - 99 mg/dL    Comment: Glucose reference range applies only to samples taken after fasting for at least 8 hours.  Glucose, capillary     Status: Abnormal   Collection Time: 04/28/20  7:52 PM  Result Value Ref Range   Glucose-Capillary 152 (H) 70 - 99 mg/dL    Comment: Glucose reference range applies only to samples taken after fasting for at least 8 hours.  Glucose, capillary     Status: Abnormal   Collection Time: 04/28/20  8:09 PM  Result Value Ref Range   Glucose-Capillary 146 (H) 70 - 99 mg/dL    Comment: Glucose reference range applies only to samples taken after fasting for at least 8 hours.  Glucose, capillary     Status: Abnormal   Collection  Time: 04/28/20 11:59 PM  Result Value Ref Range   Glucose-Capillary 137 (H) 70 - 99 mg/dL    Comment: Glucose reference range applies only to samples taken  after fasting for at least 8 hours.  Magnesium     Status: None   Collection Time: 04/29/20  1:40 AM  Result Value Ref Range   Magnesium 2.0 1.7 - 2.4 mg/dL    Comment: Performed at [facility] [address], [place], [place] [address]  Phosphorus     Status: None   Collection Time: 04/29/20  1:40 AM  Result Value Ref Range   Phosphorus 3.3 2.5 - 4.6 mg/dL    Comment: Performed at [facility] [address] [place], [place] [address]  CBC with Differential/Platelet     Status: Abnormal   Collection Time: 04/29/20  1:40 AM  Result Value Ref Range   WBC 9.1 4.0 - 10.5 K/uL   RBC 3.48 (L) 3.87 - 5.11 MIL/uL   Hemoglobin 9.5 (L) 12.0 - 15.0 g/dL   HCT 31.3 (L) 36 - 46 %   MCV 89.9 80.0 - 100.0 fL   MCH 27.3 26.0 - 34.0 pg   MCHC 30.4 30.0 - 36.0 g/dL   RDW 15.4 11.5 - 15.5 %   Platelets 431 (H) 150 - 400 K/uL   nRBC 0.0 0.0 - 0.2 %   Neutrophils Relative % 56 %   Neutro Abs 5.2 1.7 - 7.7 K/uL   Lymphocytes Relative 30 %   Lymphs Abs 2.7 0.7 - 4.0 K/uL   Monocytes Relative 10 %   Monocytes Absolute 0.9 0 - 1 K/uL   Eosinophils Relative 2 %   Eosinophils Absolute 0.2 0 - 0 K/uL   Basophils Relative 1 %   Basophils Absolute 0.1 0 - 0 K/uL   Immature Granulocytes 1 %   Abs Immature Granulocytes 0.09 (H) 0.00 - 0.07 K/uL    Comment: Performed at [facility] [address] [place], [place] [address]  Basic metabolic panel     Status: Abnormal   Collection Time: 04/29/20  1:40 AM  Result Value Ref Range   Sodium 136 135 - 145 mmol/L   Potassium 4.7 3.5 - 5.1 mmol/L   Chloride 103 98 - 111 mmol/L   CO2 24 22 - 32 mmol/L   Glucose, Bld 116 (H) 70 - 99 mg/dL    Comment: Glucose reference range applies only to samples taken after fasting for at least 8 hours.   BUN 5 (L) 8 - 23 mg/dL   Creatinine, Ser  0.43 (L) 0.44 - 1.00 mg/dL   Calcium 7.6 (L) 8.9 - 10.3 mg/dL   GFR calc non Af Amer >60 >60 mL/min   GFR calc Af Amer >60 >60 mL/min   Anion gap 9 5 - 15    Comment: Performed at [facility] [address], [place], [place] [address]  Glucose, capillary     Status: Abnormal   Collection Time: 04/29/20  4:29 AM  Result Value Ref Range   Glucose-Capillary 103 (H) 70 - 99 mg/dL    Comment: Glucose reference range applies only to samples taken after fasting for at least 8 hours.  Glucose, capillary     Status: Abnormal   Collection Time: 04/29/20  7:55 AM  Result Value Ref Range   Glucose-Capillary 121 (H) 70 - 99 mg/dL    Comment: Glucose reference range applies only to samples taken after fasting for at least 8 hours.    Radiology/Results: No results found.  Anti-infectives: Anti-infectives (From admission, onward)   Start     Dose/Rate Route Frequency Ordered Stop  04/25/20 1115  meropenem (MERREM) 1 g in sodium chloride 0.9 % 100 mL IVPB     Discontinue     1 g 200 mL/hr over 30 Minutes Intravenous Every 8 hours 04/25/20 1106     04/24/20 0915  doxycycline (VIBRA-TABS) tablet 100 mg  Status:  Discontinued        100 mg Oral Every 12 hours 04/24/20 0850 04/25/20 1106   04/20/20 1200  cefOXitin (MEFOXIN) 2 g in sodium chloride 0.9 % 100 mL IVPB        2 g 200 mL/hr over 30 Minutes Intravenous Every 8 hours 04/20/20 1130 04/20/20 1245   04/19/20 1800  cefOXitin (MEFOXIN) 2 g in sodium chloride 0.9 % 100 mL IVPB  Status:  Discontinued        2 g 200 mL/hr over 30 Minutes Intravenous Every 8 hours 04/19/20 1659 04/20/20 1130   04/17/20 0000  cefTRIAXone (ROCEPHIN) 1 g in sodium chloride 0.9 % 100 mL IVPB        1 g 200 mL/hr over 30 Minutes Intravenous  Once 04/16/20 1604 04/16/20 2347   04/16/20 0115  cefTRIAXone (ROCEPHIN) 1 g in sodium chloride 0.9 % 100 mL IVPB        1 g 200 mL/hr over 30 Minutes Intravenous  Once 04/16/20 0113 04/16/20 0435       Assessment/Plan: Problem List: Patient Active Problem List   Diagnosis Date Noted  . 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 Goals of care, counseling/discussion 04/26/2020  . Gastritis and gastroduodenitis   . Grade II internal hemorrhoids   . Abdominal distension   . Nausea and vomiting in adult   . Abnormal CT scan, gastrointestinal tract   . Colon obstruction ([place]) 04/16/2020  . UTI (urinary tract infection) 04/16/2020  . Dyspnea on exertion 01/27/2020  . Iron deficiency anemia 01/26/2020  . Vitamin B12 deficiency anemia 01/26/2020  . 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 Fuchs' corneal dystrophy 09/20/2018  . 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 Diabetes mellitus due to underlying condition with unspecified complications ([place]) 24/40/1027  . Ex-smoker 08/26/2017  . Atypical chest pain 08/26/2017  . Essential hypertension 01/11/2017  . Hypothyroidism 01/08/2017  . SVT (supraventricular tachycardia) ([place]) 01/08/2017    Making progress.  Arrangements need to be made with family about assistance with wound care at home.   9 Days Post-Op    LOS: 13 days   Matt B. Hassell Done, MD, [facility] [phone] to reach the surgeon on call.    04/29/2020 10:41 AM

## 2020-04-29 NOTE — Progress Notes (Addendum)
PROGRESS NOTE    Bailey Walker  ELF:[id number] DOB: 24-Jul-1946 DOA: 04/15/2020 PCP: [name], MD    Brief Narrative:  74 y.o.femalewith medical history significant ofhypertension, type 2 diabetes mellitus, hypothyroidism, obesity presents to emergency department with abdominal pain and vomiting since 2 days.  Patient speaks Arabic. History gathered from patient's daughter at the bedside. She mentioned that patient has severe generalized abdominal pain and vomiting since July 4 however it got worse since 2 days. She could not keep anything down, she has 10 out of 10, nonradiating, sharp pain, no aggravating or relieving factors, associated with vomiting 4 times since yesterday. Vomitus is nonbloody. She has lost about 7 to 10 pounds since July for the due to abdominal pain and vomiting. No history of colon cancer in the family. No night sweats, melena, fever, chills, headache, blurry vision, chest pain, shortness of breath, palpitation, leg swelling.  Reports dysuria since few days. No foul-smelling or change in urinary frequency, lower back pain. She had regular bowel movement this morning.  She is a former smoker. No history of alcohol, illicit drug use.  ED Course:Upon arrival to ED: Patient's vital signs stable. She received IV fluid bolus, Protonix, Zofran, Dilaudid as needed for pain control and Rocephin for possible UTI in ED. 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 Patient transferred to [facility] for further evaluation and management.    Consultants:   Surgery  Procedures:   Antimicrobials:   Meropenem   Subjective: Niece at bedside.   Pt without any new complaints.      Objective: Vitals:   04/29/20 0431 04/29/20 0500 04/29/20 1120 04/29/20 1542  BP: (!) 145/79  125/67 (!) 120/51  Pulse: 86  98 85  Resp: 16   16  Temp:    98.1 F (36.7 C)  TempSrc:      SpO2: 100%   99%    Weight:  73.5 kg    Height:        Intake/Output Summary (Last 24 hours) at 04/29/2020 1625 Last data filed at 04/29/2020 1108 Gross per 24 hour  Intake 960 ml  Output 200 ml  Net 760 ml   Filed Weights   04/16/20 1805 04/19/20 0840 04/29/20 0500  Weight: 72.6 kg 72.6 kg 73.5 kg    Examination:  General exam sitting in chair, NAD Respiratory system: CTA, no wheeze rales rhonchi's cardiovascular system: RRR S1-S2 no murmurs  Gastrointestinal system: Mid abdomen dressing in place.  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  Central nervous system: Awake and alert, grossly intact Extremities: no LE edema Skin: Warm and dry Psychiatry:  Mood & affect appropriate    Data Reviewed: I have personally reviewed following labs and imaging studies  CBC: Recent Labs  Lab 04/25/20 0219 04/26/20 0444 04/27/20 0835 04/28/20 0301 04/29/20 0140  WBC 10.6* 9.6 8.5 9.2 9.1  NEUTROABS 7.8* 6.8 5.3 5.5 5.2  HGB 9.5* 9.4* 9.2* 9.4* 9.5*  HCT 30.4* 30.3* 30.2* 30.0* 31.3*  MCV 88.9 90.7 91.0 90.6 89.9  PLT 315 339 374 390 585*   Basic Metabolic Panel: Recent Labs  Lab 04/24/20 0144 04/24/20 0144 04/25/20 0219 04/25/20 0219 04/26/20 0444 04/26/20 2014 04/27/20 0835 04/28/20 0301 04/29/20 0140  NA 133*  --  134*  --  137  --  136  --  136  K 3.0*  --  3.0*  --  2.8*  --  4.3  --  4.7  CL 102  --  101  --  104  --  105  --  103  CO2 22  --  21*  --  24  --  23  --  24  GLUCOSE 113*  --  115*  --  125*  --  112*  --  116*  BUN <5*  --  <5*  --  <5*  --  <5*  --  5*  CREATININE 0.49  --  0.49  --  0.56  --  0.43*  --  0.43*  CALCIUM 6.7*  --  6.6*  --  6.5*  --  6.5*  --  7.6*  MG 2.1   < > 2.0  --  1.9  --  1.8 1.9 2.0  PHOS 2.0*   < > 1.3*   < > 1.4* 1.0* 2.6 2.4* 3.3   < > = values in this interval not displayed.   GFR: Estimated Creatinine Clearance: 54.9 mL/min (A) (by C-G formula based on  SCr of 0.43 mg/dL (L)). Liver Function Tests: Recent Labs  Lab 04/23/20 0256  AST 15  ALT 18  ALKPHOS 58  BILITOT 1.1  PROT 5.5*  ALBUMIN 2.6*   No results for input(s): LIPASE, AMYLASE in the last 168 hours. No results for input(s): AMMONIA in the last 168 hours. Coagulation Profile: No results for input(s): INR, PROTIME in the last 168 hours. Cardiac Enzymes: No results for input(s): CKTOTAL, CKMB, CKMBINDEX, TROPONINI in the last 168 hours. BNP (last 3 results) No results for input(s): PROBNP in the last 8760 hours. HbA1C: No results for input(s): HGBA1C in the last 72 hours. CBG: Recent Labs  Lab 04/28/20 2009 04/28/20 2359 04/29/20 0429 04/29/20 0755 04/29/20 1232  GLUCAP 146* 137* 103* 121* 160*   Lipid Profile: No results for input(s): CHOL, HDL, LDLCALC, TRIG, CHOLHDL, LDLDIRECT in the last 72 hours. Thyroid Function Tests: No results for input(s): TSH, T4TOTAL, FREET4, T3FREE, THYROIDAB in the last 72 hours. Anemia Panel: No results for input(s): VITAMINB12, FOLATE, FERRITIN, TIBC, IRON, RETICCTPCT in the last 72 hours. Sepsis Labs: No results for input(s): PROCALCITON, LATICACIDVEN in the last 168 hours.  Recent Results (from the past 240 hour(s))  Culture, Urine     Status: None   Collection Time: 04/27/20  3:44 PM   Specimen: Urine, Catheterized  Result Value Ref Range Status   Specimen Description URINE, CATHETERIZED  Final   Special Requests NONE  Final   Culture   Final    NO GROWTH Performed at [facility], [address] [place], [place] [address]    Report Status 04/28/2020 FINAL  Final         Radiology Studies: No results found.      Scheduled Meds: . aspirin EC  81 mg Oral Daily  . docusate sodium  100 mg Oral BID  . enoxaparin (LOVENOX) injection  40 mg Subcutaneous Q24H  . feeding supplement (ENSURE ENLIVE)  237 mL Oral TID BM  . insulin aspart  0-20 Units Subcutaneous Q4H  . levothyroxine  75 mcg Oral QAC  breakfast  . lidocaine  1 patch Transdermal Q24H  . metoprolol tartrate  25 mg Oral BID  . multivitamin with minerals  1 tablet Oral Daily  . pantoprazole  40 mg Oral Daily  . pneumococcal 23 valent vaccine  0.5 mL Intramuscular Tomorrow-1000   Continuous Infusions: . lactated ringers 20 mL/hr at 04/18/20 0940  .  meropenem (MERREM) IV 1 g (04/29/20 1127)  . 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 -continue IVF hydration for now -Appreciate assistance by GI.  Gastritis noted on EGD. Flex sig now confirming obstructing colonic mass -Appreciate General Surgery assistance.s/p sigmoid colectomy and end colostomy by Dr. 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 Pre-opCEA mildly elevated at 14. Dr. Marin Olp following. CT chest without metastasis. Med Onc saw 8/3 and will follow -Advancing diet per general surgery. Empiric doxycycline started 8/3 by general surgery----Midline wound opened with evacuation of hematoma 8/2. BID WTD dressing changes.Not a good candidate for vac with tunneling of wound.   may need CT in 1-2 days. ..>will d/w Gsx -CT chest without evidence of metastatic disease noted -Oncology following,  likely plan for chemo once surgical incision heals Continue taking small meals nongreasy and encourage several shakes a day per surgery's recommendation Cellulitis slowly with dressing and improving but still needs IV antibiotics we Plan: will continue with IV meropenem   UTI-patient reported dysuria. UA is positive for leukocyte/bacteria/WBC -She received Rocephin in ED. Completed empiric course of rocephin.  Urine culture results are indeterminate.  8/6 patient complaining of dysuria again. Possibly abdominal pressure causing sx? Also on meropenem for abd cellulitis which would tx uti too UA negative Ucx negative so far, will f/u final results  Hypertension: Stable Continue with metoprolol  Hypothyroidism:  TSH elevated at 15.312  Free T4 normal Continue levothyroxine. Will need repeat thyroid labs in 4 weeks after acute illness resolves  Type 2 diabetes mellitus: Hold glipizide and Metformin. Blood glucose levels stable  Continue R-ISS, check fingerstick, hypoglycemic protocol   Iron deficiency anemia: H&H is stable at this time -Continue ferrous sulfate ? I dont see this in mar. Will d/w pharm -Followed by hematology as outpatient Dr. Alvy Bimler Continue checking CBC periodically Hypokalemia/Hypophosh.- Replaced, K 4.7 Magnesium 2.0 phophate 3.3 Dc kcl and phophate supplement Monitor levels.     DVT prophylaxis: Lovenox subq Code Status: Full Family Communication:  Niece updated at bedside  Status is: Inpatient  Remains inpatient appropriate because:IV treatments appropriate due to intensity of illness or inability to take PO   Dispo: The patient is from: Home              Anticipated d/c is to: Home with [facility]              Anticipated d/c date is: 1 -2 days              Patient currently is not medically stable to d/c.  Continue needing IV antibiotics needs to be cleared from surgical standpoint and cellulitis improving prior to discharge. Needs HH  arrangment            LOS: 13 days   Time spent: 35 minutes with more than 50%  on [place], MD [facility] Pager [phone]  If 7PM-7AM, please contact night-coverage [email or web address] Password [facility] 04/29/2020, 4:25 PM

## 2020-04-30 ENCOUNTER — Inpatient Hospital Stay (HOSPITAL_COMMUNITY): Payer: Medicaid Other

## 2020-04-30 LAB — CBC WITH DIFFERENTIAL/PLATELET
Abs Immature Granulocytes: 0.08 10*3/uL — ABNORMAL HIGH (ref 0.00–0.07)
Basophils Absolute: 0.1 10*3/uL (ref 0.0–0.1)
Basophils Relative: 1 %
Eosinophils Absolute: 0.2 10*3/uL (ref 0.0–0.5)
Eosinophils Relative: 3 %
HCT: 31.9 % — ABNORMAL LOW (ref 36.0–46.0)
Hemoglobin: 9.8 g/dL — ABNORMAL LOW (ref 12.0–15.0)
Immature Granulocytes: 1 %
Lymphocytes Relative: 22 %
Lymphs Abs: 1.9 10*3/uL (ref 0.7–4.0)
MCH: 27.8 pg (ref 26.0–34.0)
MCHC: 30.7 g/dL (ref 30.0–36.0)
MCV: 90.4 fL (ref 80.0–100.0)
Monocytes Absolute: 0.9 10*3/uL (ref 0.1–1.0)
Monocytes Relative: 11 %
Neutro Abs: 5.3 10*3/uL (ref 1.7–7.7)
Neutrophils Relative %: 62 %
Platelets: 424 10*3/uL — ABNORMAL HIGH (ref 150–400)
RBC: 3.53 MIL/uL — ABNORMAL LOW (ref 3.87–5.11)
RDW: 15.3 % (ref 11.5–15.5)
WBC: 8.5 10*3/uL (ref 4.0–10.5)
nRBC: 0 % (ref 0.0–0.2)

## 2020-04-30 LAB — GLUCOSE, CAPILLARY
Glucose-Capillary: 118 mg/dL — ABNORMAL HIGH (ref 70–99)
Glucose-Capillary: 114 mg/dL — ABNORMAL HIGH (ref 70–99)
Glucose-Capillary: 126 mg/dL — ABNORMAL HIGH (ref 70–99)
Glucose-Capillary: 128 mg/dL — ABNORMAL HIGH (ref 70–99)

## 2020-04-30 LAB — CREATININE, SERUM
Creatinine, Ser: 0.51 mg/dL (ref 0.44–1.00)
GFR calc Af Amer: >60 mL/min (ref >60)
GFR calc non Af Amer: >60 mL/min (ref >60)

## 2020-04-30 LAB — MAGNESIUM: Magnesium: 2.3 mg/dL (ref 1.7–2.4)

## 2020-04-30 LAB — PHOSPHORUS: Phosphorus: 3.5 mg/dL (ref 2.5–4.6)

## 2020-04-30 MED ORDER — VITAMIN B-12 1000 MCG PO TABS: 500.0000 ug | ORAL_TABLET | Freq: Every day | ORAL | Status: DC

## 2020-04-30 MED ORDER — IOHEXOL 300 MG/ML  SOLN
100.0000 mL | Freq: Once | INTRAMUSCULAR | Status: AC | PRN
  Administered 2020-04-30: 100 mL via INTRAVENOUS

## 2020-04-30 MED ORDER — IOHEXOL 9 MG/ML PO SOLN
500.0000 mL | ORAL | Status: AC
  Administered 2020-04-30 (×2): 500 mL via ORAL

## 2020-04-30 NOTE — Progress Notes (Signed)
Niece able to change colostomy and do dressing change .

## 2020-04-30 NOTE — Progress Notes (Signed)
10 Days Post-Op   Subjective/Chief Complaint: Using bathroom, tol her diet, no real complaints     Objective: Vital signs in last 24 hours: Temp:  [98.1 F (36.7 C)-98.3 F (36.8 C)] 98.2 F (36.8 C) (08/09 0420) Pulse Rate:  [83-98] 83 (08/09 0420) Resp:  [16-18] 17 (08/09 0420) BP: (120-131)/(51-71) 125/71 (08/09 0420) SpO2:  [97 %-99 %] 97 % (08/09 0420) Weight:  [73.8 kg] 73.8 kg (08/09 0500) Last BM Date: 04/29/20  Intake/Output from previous day: 08/08 0701 - 08/09 0700 In: 1217.3 [P.O.:920; IV Piggyback:297.3] Out: 100 [Stool:100] Intake/Output this shift: No intake/output data recorded.  gen nad Ab soft approp tender stoma pink and functional with stool and air in bag, see wound picture below, the area of redness to right of incision has coalesced now into  Tender spot  Lab Results:  Recent Labs    04/29/20 0140 04/30/20 0648  WBC 9.1 8.5  HGB 9.5* 9.8*  HCT 31.3* 31.9*  PLT 431* 424*   BMET Recent Labs    04/27/20 0835 04/27/20 0835 04/29/20 0140 04/30/20 0648  NA 136  --  136  --   K 4.3  --  4.7  --   CL 105  --  103  --   CO2 23  --  24  --   GLUCOSE 112*  --  116*  --   BUN <5*  --  5*  --   CREATININE 0.43*   < > 0.43* 0.51  CALCIUM 6.5*  --  7.6*  --    < > = values in this interval not displayed.   PT/INR No results for input(s): LABPROT, INR in the last 72 hours. ABG No results for input(s): PHART, HCO3 in the last 72 hours.  Invalid input(s): PCO2, PO2  Studies/Results: No results found.  Anti-infectives: Anti-infectives (From admission, onward)   Start     Dose/Rate Route Frequency Ordered Stop   04/25/20 1115  meropenem (MERREM) 1 g in sodium chloride 0.9 % 100 mL IVPB     Discontinue     1 g 200 mL/hr over 30 Minutes Intravenous Every 8 hours 04/25/20 1106     04/24/20 0915  doxycycline (VIBRA-TABS) tablet 100 mg  Status:  Discontinued        100 mg Oral Every 12 hours 04/24/20 0850 04/25/20 1106   04/20/20 1200   cefOXitin (MEFOXIN) 2 g in sodium chloride 0.9 % 100 mL IVPB        2 g 200 mL/hr over 30 Minutes Intravenous Every 8 hours 04/20/20 1130 04/20/20 1245   04/19/20 1800  cefOXitin (MEFOXIN) 2 g in sodium chloride 0.9 % 100 mL IVPB  Status:  Discontinued        2 g 200 mL/hr over 30 Minutes Intravenous Every 8 hours 04/19/20 1659 04/20/20 1130   04/17/20 0000  cefTRIAXone (ROCEPHIN) 1 g in sodium chloride 0.9 % 100 mL IVPB        1 g 200 mL/hr over 30 Minutes Intravenous  Once 04/16/20 1604 04/16/20 2347   04/16/20 0115  cefTRIAXone (ROCEPHIN) 1 g in sodium chloride 0.9 % 100 mL IVPB        1 g 200 mL/hr over 30 Minutes Intravenous  Once 04/16/20 0113 04/16/20 0435      Assessment/Plan: POD 10 sigmoid colectomy and end colostomy by Dr. Ninfa Linden  -Path with invasive adenocarcinoma with negative resection margins, metastatic carcinoma of 1/15 lymph notes and 1 satellite nodule.Pre-opCEA mildly elevated at 14.  Dr. Marin Olp following. CT chest without metastasis. Med Onc saw 8/3 and will follow -Midline wound opened with evacuation of hematoma8/2. BID WTD dressing changes.Not a good candidate for vac with tunneling of wound. OnIVabx for cellulitis.there is area on right of abdominal wall that likely needs to be evaluated in or and drained.  She wont let me examine a lot now due to pain. I am going to do ct scan to ensure nothing more than this and then discuss with family - WBC normalized, no fevers. Tolerating diet - pulmonary toilet KPQ:AESL, nutritional shakes VTE: SCDs, lovenox ID: [id number] >>.Afebrile. Unsure about plan initially for length of abx- will follow up ct scan first Follow-Up - Dr. Ninfa Linden  Dispo - TOC continues to investigate options for discharge. ? Private nurse, hopefully ready for dc mid-week once abdominal wall issue resolved   Rolm Bookbinder 04/30/2020

## 2020-04-30 NOTE — Consult Note (Signed)
[place] Nurse ostomy follow up Patient receiving care in [place].  I spoke with her primary RN, Joaquim Lai, by phone. Joaquim Lai tells me the niece changed the ostomy pouch this morning, and it is doing well right now.  Also, that the niece refused help this morning as she was changing it. Patient is to undergo a CT of the abdomen in the near future.  Val Riles, RN, MSN, CWOCN, CNS-BC, pager [phone]

## 2020-04-30 NOTE — Progress Notes (Signed)
PROGRESS NOTE    Bailey Walker  WCB:[id number] DOB: [birth year] DOA: 04/15/2020 PCP: [name], MD    Brief Narrative:  74 y.o.femalewith medical history significant ofhypertension, type 2 diabetes mellitus, hypothyroidism, obesity presents to emergency department with abdominal pain and vomiting since 2 days.  Patient speaks Arabic. History gathered from patient's daughter at the bedside. She mentioned that patient has severe generalized abdominal pain and vomiting since July 4 however it got worse since 2 days. She could not keep anything down, she has 10 out of 10, nonradiating, sharp pain, no aggravating or relieving factors, associated with vomiting 4 times since yesterday. Vomitus is nonbloody. She has lost about 7 to 10 pounds since July for the due to abdominal pain and vomiting. No history of colon cancer in the family. No night sweats, melena, fever, chills, headache, blurry vision, chest pain, shortness of breath, palpitation, leg swelling.  Reports dysuria since few days. No foul-smelling or change in urinary frequency, lower back pain. She had regular bowel movement this morning.  She is a former smoker. No history of alcohol, illicit drug use.  ED Course:Upon arrival to ED: Patient's vital signs stable. She received IV fluid bolus, Protonix, Zofran, Dilaudid as needed for pain control and Rocephin for possible UTI in ED. 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 Patient transferred to [facility] for further evaluation and management.    Consultants:   Surgery  Procedures:   Antimicrobials:   Meropenem   Subjective: Family at bedside.  Patient has no complaints.  No nausea or vomiting.    Objective: Vitals:   04/29/20 1949 04/30/20 0420 04/30/20 0500 04/30/20 1510  BP: 131/70 125/71  99/67  Pulse: 85 83  99  Resp: 18 17  18   Temp: 98.3 F (36.8 C) 98.2 F (36.8 C)  98.2 F  (36.8 C)  TempSrc: Oral Oral  Oral  SpO2: 98% 97%  99%  Weight:   73.8 kg   Height:        Intake/Output Summary (Last 24 hours) at 04/30/2020 1606 Last data filed at 04/30/2020 0513 Gross per 24 hour  Intake 617.28 ml  Output 100 ml  Net 517.28 ml   Filed Weights   04/19/20 0840 04/29/20 0500 04/30/20 0500  Weight: 72.6 kg 73.5 kg 73.8 kg    Examination:  General exam,, comfortable, sitting in chair Respiratory system: CTA, no wheeze rales rhonchi, good respiratory effort cardiovascular system: RRR S1-S2 no murmurs no rubs or gallops Gastrointestinal system: Mid abdomen dressing in place.  Left upper quadrant colostomy in place with dark brown liquidy stool at 44, positive bowel sounds but distant.outside of dressing erythema improving.  Central nervous system: Awake and alert, grossly intact Extremities: no LE edema or cyanosis Skin: Warm and dry Psychiatry:  Mood & affect appropriate in current setting    Data Reviewed: I have personally reviewed following labs and imaging studies  CBC: Recent Labs  Lab 04/26/20 0444 04/27/20 0835 04/28/20 0301 04/29/20 0140 04/30/20 0648  WBC 9.6 8.5 9.2 9.1 8.5  NEUTROABS 6.8 5.3 5.5 5.2 5.3  HGB 9.4* 9.2* 9.4* 9.5* 9.8*  HCT 30.3* 30.2* 30.0* 31.3* 31.9*  MCV 90.7 91.0 90.6 89.9 90.4  PLT 339 374 390 431* 616*   Basic Metabolic Panel: Recent Labs  Lab 04/24/20 0144 04/24/20 0144 04/25/20 0219 04/25/20 0219 04/26/20 0444 04/26/20 0444 04/26/20 2014 04/27/20 0835 04/28/20 0301 04/29/20 0140 04/30/20 0648  NA 133*  --  134*  --  137  --   --  136  --  136  --   K 3.0*  --  3.0*  --  2.8*  --   --  4.3  --  4.7  --   CL 102  --  101  --  104  --   --  105  --  103  --   CO2 22  --  21*  --  24  --   --  23  --  24  --   GLUCOSE 113*  --  115*  --  125*  --   --  112*  --  116*  --   BUN <5*  --  <5*  --  <5*  --   --  <5*  --  5*  --   CREATININE 0.49   < > 0.49  --  0.56  --   --  0.43*  --  0.43* 0.51  CALCIUM  6.7*  --  6.6*  --  6.5*  --   --  6.5*  --  7.6*  --   MG 2.1   < > 2.0   < > 1.9  --   --  1.8 1.9 2.0 2.3  PHOS 2.0*   < > 1.3*   < > 1.4*   < > 1.0* 2.6 2.4* 3.3 3.5   < > = values in this interval not displayed.   GFR: Estimated Creatinine Clearance: 55 mL/min (by C-G formula based on SCr of 0.51 mg/dL). Liver Function Tests: No results for input(s): AST, ALT, ALKPHOS, BILITOT, PROT, ALBUMIN in the last 168 hours. No results for input(s): LIPASE, AMYLASE in the last 168 hours. No results for input(s): AMMONIA in the last 168 hours. Coagulation Profile: No results for input(s): INR, PROTIME in the last 168 hours. Cardiac Enzymes: No results for input(s): CKTOTAL, CKMB, CKMBINDEX, TROPONINI in the last 168 hours. BNP (last 3 results) No results for input(s): PROBNP in the last 8760 hours. HbA1C: No results for input(s): HGBA1C in the last 72 hours. CBG: Recent Labs  Lab 04/29/20 1232 04/29/20 1655 04/29/20 2111 04/30/20 0739 04/30/20 1216  GLUCAP 160* 152* 107* 118* 114*   Lipid Profile: No results for input(s): CHOL, HDL, LDLCALC, TRIG, CHOLHDL, LDLDIRECT in the last 72 hours. Thyroid Function Tests: No results for input(s): TSH, T4TOTAL, FREET4, T3FREE, THYROIDAB in the last 72 hours. Anemia Panel: No results for input(s): VITAMINB12, FOLATE, FERRITIN, TIBC, IRON, RETICCTPCT in the last 72 hours. Sepsis Labs: No results for input(s): PROCALCITON, LATICACIDVEN in the last 168 hours.  Recent Results (from the past 240 hour(s))  Culture, Urine     Status: None   Collection Time: 04/27/20  3:44 PM   Specimen: Urine, Catheterized  Result Value Ref Range Status   Specimen Description URINE, CATHETERIZED  Final   Special Requests NONE  Final   Culture   Final    NO GROWTH Performed at [facility], [address], [place], [place] [address]    Report Status 04/28/2020 FINAL  Final         Radiology Studies: CT ABDOMEN PELVIS W CONTRAST  Result Date:  04/30/2020 CLINICAL DATA:  Intra-abdominal abscess.  Vomiting. EXAM: CT ABDOMEN AND PELVIS WITH CONTRAST TECHNIQUE: Multidetector CT imaging of the abdomen and pelvis was performed using the standard protocol following bolus administration of intravenous contrast. CONTRAST:  185mL OMNIPAQUE IOHEXOL 300 MG/ML  SOLN COMPARISON:  CT chest 04/22/2020 FINDINGS: Lower chest: Lung  bases are clear. Hepatobiliary: No focal hepatic lesion. No biliary duct dilatation. Common bile duct is normal. Pancreas: Pancreas is normal. No ductal dilatation. No pancreatic inflammation. Spleen: Normal spleen Adrenals/urinary tract: Adrenal glands and kidneys are normal. The ureters and bladder normal. Stomach/Bowel: Small hiatal hernia. Stomach, duodenum small-bowel normal. Appendix not identified. Colon is predominantly collapsed. There is mild bowel wall thickening throughout the entire colon. LEFT abdominal wall colostomy. No obstruction. Rectal pouch noted without complication. Vascular/Lymphatic: Abdominal aorta is normal caliber with atherosclerotic calcification. There is no retroperitoneal or periportal lymphadenopathy. No pelvic lymphadenopathy. Lymph node within the bowel mesentery anterior to the LEFT psoas muscle measures 7 mm on image 50/3). Reproductive: Uterus and adnexa unremarkable. Other: Midline ventral wound dehiscence. No organized fluid collections along the ventral abdominal wall. No peritoneal abscess or extension. Peritoneal nodule in the ventral RIGHT abdomen anterior to the ascending colon measures 8 mm (48/3). Musculoskeletal: No aggressive osseous lesion IMPRESSION: 1. Midline ventral wound dehiscence. No organized fluid collections within the subcutaneous tissue, muscle or fascial layer. No intra-abdominal extension identified. 2. Post partial LEFT colectomy for sigmoid colon mass. Small residual lymph node in the LEFT abdominal mesentery. 3. Small peritoneal nodule in the RIGHT ventral peritoneal surface.  Cannot exclude a peritoneal metastasis. 4. Mild diffuse colon wall thickening could indicate mild colitis. Electronically Signed   By: Suzy Bouchard M.D.   On: 04/30/2020 13:00        Scheduled Meds: . aspirin EC  81 mg Oral Daily  . docusate sodium  100 mg Oral BID  . enoxaparin (LOVENOX) injection  40 mg Subcutaneous Q24H  . feeding supplement (ENSURE ENLIVE)  237 mL Oral TID BM  . insulin aspart  0-20 Units Subcutaneous TID AC & HS  . levothyroxine  75 mcg Oral QAC breakfast  . lidocaine  1 patch Transdermal Q24H  . metoprolol tartrate  25 mg Oral BID  . multivitamin with minerals  1 tablet Oral Daily  . pantoprazole  40 mg Oral Daily  . pneumococcal 23 valent vaccine  0.5 mL Intramuscular Tomorrow-1000   Continuous Infusions: . lactated ringers 20 mL/hr at 04/18/20 0940  . meropenem (MERREM) IV 1 g (04/30/20 1005)  . 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 -continue IVF hydration for now -Appreciate assistance by GI.  Gastritis noted on EGD. Flex sig now confirming obstructing colonic mass -Appreciate General Surgery assistance.s/p sigmoid colectomy and end colostomy by Dr. Ninfa Linden on 7/30-Surgical pathology confirms invasive adenocarcinoma.metastatic carcinoma of 1/15 lymph notes and 1  satellite nodule. Pre-opCEA mildly elevated at 14. Dr. Marin Olp following. CT chest without metastasis. Med Onc saw 8/3 and will follow -Advancing diet per general surgery. Empiric doxycycline started 8/3 by general surgery----Midline wound opened with evacuation of hematoma 8/2. BID WTD dressing changes.Not a good candidate for vac with tunneling of wound.   may need CT  in 1-2 days. ..>will d/w Gsx -CT chest without evidence of metastatic disease noted -Oncology following, likely plan for chemo once surgical incision heals Continue taking small meals nongreasy and encourage several shakes a day per surgery's recommendation Cellulitis slowly with dressing and improving but still needs IV antibiotics we 8/9-CT scan per surgery to evaluate area in abdominal wall for ? Drainage. Will continue iv abx meropenem  UTI-patient reported dysuria. UA is positive for leukocyte/bacteria/WBC -She received Rocephin in ED. Completed empiric course of rocephin.  Urine culture results are indeterminate.  8/6 patient complaining of dysuria again. Possibly abdominal pressure causing sx? Also on meropenem for abd cellulitis which would tx uti too UA negative Urine culture negative  Hypertension: Stable Continue metoprolol  Hypothyroidism:  TSH elevated at 15.312  Free T4 normal Continue levothyroxine. Will need repeat thyroid labs as outpatient in 4 weeks after acute illness resolves    Type 2 diabetes mellitus: Hold glipizide and Metformin. Blood glucose levels stable  Continue R ISS, check fingersticks, hypoglycemia protocol     Iron deficiency anemia: H&H is stable at this time -Continue ferrous sulfate ? I dont see this in mar. Will d/w pharm -Followed by hematology as outpatient Dr. Alvy Bimler H&H stable.  Periodically will check CBC   Hypokalemia/Hypophosh.- Replaced, K 4.7 Magnesium 2.0 phophate 3.3 Continue to monitor periodically    DVT prophylaxis: Lovenox subq Code Status:  Full Family Communication:  Niece updated at bedside  Status is: Inpatient  Remains inpatient appropriate because:IV treatments appropriate due to intensity of illness or inability to take PO   Dispo: The patient is from: Home              Anticipated d/c is to: Home with [facility]              Anticipated d/c date is: 1 -2 days              Patient currently is not medically stable to d/c.  Continue needing IV antibiotics and diagnositic w/u , CT today          LOS: 14 days   Time spent: 35 minutes with more than 50% on [place], MD [facility] Pager [phone]  If 7PM-7AM, please contact night-coverage [email or web address] Password [email or web address] 04/30/2020, 4:06 PM

## 2020-04-30 NOTE — TOC Progression Note (Addendum)
Transition of Care [facility]) - Progression Note    Patient Details  Name: Bailey Walker MRN: [id number] Date of Birth: 08-02-1946  Transition of Care [facility]) CM/SW [place], [place] Phone Number: 04/30/2020, 12:09 PM  Clinical Narrative:   3:46pm- CSW spoke with pt niece Bailey Walker via telephone. CSW gave Bailey Walker information that I had received a prior authorization for [facility] and [facility] but that we have not found [place] agency able to take benefits/staff case. Bailey Walker has called [employer] for private pay nursing costs and states they cannot afford the estimates. Pt niece states she has learned to do dressing changes and colostomy care but still wants RN to assist. CSW will continue to try and local [facility] agency but family understands that we do not have one currently. We remain available and she has this writers contact information for any additional questions.     12:09pm- CSW received prior authorization from [facility] for pt [facility] and [facility]. At this time no [place] agency to accept pt under her Medicaid benefits. This has been discussed with family multiple times and they have been given information about private RN staffing/PCS under private pay. [employer] has prior authorized a hospital bed (as long as DME company in network with [employer] which confirmed [place] is). The other requested DME (air mattress, chucks, ostomy supplies, 3n1) does not require prior authorization and can be ordered when pt ready.   At this time multiple TOC team members have spoken with family and discussed our inability to find a [facility] agency willing to staff under Medicaid benefits/with appropriate staffing to take case. Family will need to provide wound care or private pay for nursing to assist.    Expected Discharge Plan: [place] Barriers to Discharge: Continued Medical Work up, Inadequate or no insurance, Other (comment), No Home Care Agency will accept this patient (new  colostomy/wound care needs)  Expected Discharge Plan and Services Expected Discharge Plan: [place] In-house Referral: Clinical Social Work Discharge Planning Services: CM Consult Post Acute Care Choice: Durable Medical Equipment Living arrangements for the past 2 months: Single Family Home  Readmission Risk Interventions Readmission Risk Prevention Plan 04/25/2020  Transportation Screening Complete  PCP or Specialist Appt within 3-5 Days Not Complete  Not Complete comments pending disposition  HRI or Home Care Consult Complete  Social Work Consult for [place] Planning/Counseling Complete  Palliative Care Screening Not Applicable  Medication Review Press photographer) Referral to Pharmacy  Some recent data might be hidden

## 2020-04-30 NOTE — Progress Notes (Signed)
Physical Therapy Treatment Patient Details Name: Bailey Walker MRN: [id number] DOB: 1946-04-29 Today's Date: 04/30/2020    History of Present Illness Pt is a 74 y.o. F with significant PMH of hypertension, type 2 diabetes mellitus, obesity who presents with abdominal pain and vomiting. 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 Surgical pathology confirms adenocarcinoma. CT chest without evidence of metastatic disease noted. UA also positive for UTI.     PT Comments    Pt niece reports she has changed the abdominal wound dressing four times and has also been doing colostomy care. Pt has been making excellent progress towards her physical therapy goals. Has been ambulating multiple times per day with her niece. During therapy session, pt ambulating 250 feet with a walker at a supervision level. Able to participate in standing/seated exercises. Due to progress, don't anticipate need for PT follow up. Will continue to follow acutely to progress mobility as tolerated.    Follow Up Recommendations  No PT follow up;Supervision for mobility/OOB     Equipment Recommendations  Rolling walker with 5" wheels;3in1 (PT)    Recommendations for Other Services       Precautions / Restrictions Precautions Precautions: Fall;Other (comment) Precaution Comments: open abdominal wound, colostomy Restrictions Weight Bearing Restrictions: No    Mobility  Bed Mobility               General bed mobility comments: OOB in chair  Transfers Overall transfer level: Modified independent Equipment used: Rolling walker (2 wheeled)                Ambulation/Gait Ambulation/Gait assistance: Supervision Gait Distance (Feet): 250 Feet Assistive device: Rolling walker (2 wheeled) Gait Pattern/deviations: Step-through pattern;Decreased stride length Gait velocity: decreased   General Gait Details: Steady pace,  no gross imbalance, supervision for safety   Stairs             Wheelchair Mobility    Modified Rankin (Stroke Patients Only)       Balance Overall balance assessment: Needs assistance Sitting-balance support: Feet supported Sitting balance-Leahy Scale: Good     Standing balance support: During functional activity;No upper extremity supported Standing balance-Leahy Scale: Good                              Cognition Arousal/Alertness: Awake/alert Behavior During Therapy: WFL for tasks assessed/performed Overall Cognitive Status: Within Functional Limits for tasks assessed                                        Exercises General Exercises - Lower Extremity Long Arc Quad: Both;10 reps;Seated Mini-Sqauts: Both;5 reps;Standing    General Comments        Pertinent Vitals/Pain Pain Assessment: Faces Faces Pain Scale: Hurts a little bit Pain Location: abdomen Pain Descriptors / Indicators: Guarding Pain Intervention(s): Monitored during session    Home Living                      Prior Function            PT Goals (current goals can now be found in the care plan section) Acute Rehab PT Goals Patient Stated Goal: go home Potential to Achieve Goals: Good Progress towards PT goals: Progressing toward goals    Frequency    Min  3X/week      PT Plan Discharge plan needs to be updated    Co-evaluation              AM-PAC PT "6 Clicks" Mobility   Outcome Measure  Help needed turning from your back to your side while in a flat bed without using bedrails?: None Help needed moving from lying on your back to sitting on the side of a flat bed without using bedrails?: None Help needed moving to and from a bed to a chair (including a wheelchair)?: None Help needed standing up from a chair using your arms (e.g., wheelchair or bedside chair)?: None Help needed to walk in hospital room?: None Help needed climbing 3-5  steps with a railing? : A Little 6 Click Score: 23    End of Session   Activity Tolerance: Patient tolerated treatment well Patient left: in chair;with call bell/phone within reach;with family/visitor present Nurse Communication: Mobility status PT Visit Diagnosis: Pain;Difficulty in walking, not elsewhere classified (R26.2)     Time: 5003-7048 PT Time Calculation (min) (ACUTE ONLY): 19 min  Charges:  $Therapeutic Activity: 8-22 mins                       Wyona Almas, PT, DPT Acute Rehabilitation Services Pager [phone] Office [phone]    Deno Etienne 04/30/2020, 5:17 PM

## 2020-05-01 ENCOUNTER — Inpatient Hospital Stay (HOSPITAL_COMMUNITY): Payer: Medicaid Other | Admitting: Certified Registered Nurse Anesthetist

## 2020-05-01 ENCOUNTER — Encounter (HOSPITAL_COMMUNITY): Payer: Self-pay | Admitting: Family Medicine

## 2020-05-01 ENCOUNTER — Encounter (HOSPITAL_COMMUNITY)
Admission: EM | Disposition: A | Discharge status: Home Health | Payer: Self-pay | Source: Home / Self Care | Attending: Internal Medicine

## 2020-05-01 HISTORY — PX: INCISION AND DRAINAGE OF WOUND: SHX1803

## 2020-05-01 LAB — CBC WITH DIFFERENTIAL/PLATELET
Abs Immature Granulocytes: 0.06 10*3/uL (ref 0.00–0.07)
Basophils Absolute: 0.1 10*3/uL (ref 0.0–0.1)
Basophils Relative: 1 %
Eosinophils Absolute: 0.3 10*3/uL (ref 0.0–0.5)
Eosinophils Relative: 3 %
HCT: 31.6 % — ABNORMAL LOW (ref 36.0–46.0)
Hemoglobin: 9.6 g/dL — ABNORMAL LOW (ref 12.0–15.0)
Immature Granulocytes: 1 %
Lymphocytes Relative: 18 %
Lymphs Abs: 1.6 10*3/uL (ref 0.7–4.0)
MCH: 27.4 pg (ref 26.0–34.0)
MCHC: 30.4 g/dL (ref 30.0–36.0)
MCV: 90.3 fL (ref 80.0–100.0)
Monocytes Absolute: 1 10*3/uL (ref 0.1–1.0)
Monocytes Relative: 11 %
Neutro Abs: 6 10*3/uL (ref 1.7–7.7)
Neutrophils Relative %: 66 %
Platelets: 439 10*3/uL — ABNORMAL HIGH (ref 150–400)
RBC: 3.5 MIL/uL — ABNORMAL LOW (ref 3.87–5.11)
RDW: 15.2 % (ref 11.5–15.5)
WBC: 8.9 10*3/uL (ref 4.0–10.5)
nRBC: 0 % (ref 0.0–0.2)

## 2020-05-01 LAB — GLUCOSE, CAPILLARY
Glucose-Capillary: 121 mg/dL — ABNORMAL HIGH (ref 70–99)
Glucose-Capillary: 102 mg/dL — ABNORMAL HIGH (ref 70–99)
Glucose-Capillary: 100 mg/dL — ABNORMAL HIGH (ref 70–99)
Glucose-Capillary: 92 mg/dL (ref 70–99)
Glucose-Capillary: 147 mg/dL — ABNORMAL HIGH (ref 70–99)

## 2020-05-01 LAB — PHOSPHORUS: Phosphorus: 3.9 mg/dL (ref 2.5–4.6)

## 2020-05-01 LAB — MAGNESIUM: Magnesium: 2.1 mg/dL (ref 1.7–2.4)

## 2020-05-01 SURGERY — IRRIGATION AND DEBRIDEMENT WOUND
Anesthesia: Monitor Anesthesia Care | Site: Abdomen

## 2020-05-01 MED ORDER — 0.9 % SODIUM CHLORIDE (POUR BTL) OPTIME
TOPICAL | Status: DC | PRN
  Administered 2020-05-01: 1000 mL

## 2020-05-01 MED ORDER — FENTANYL CITRATE (PF) 100 MCG/2ML IJ SOLN: 25.0000 ug | INTRAMUSCULAR | Status: DC | PRN

## 2020-05-01 MED ORDER — ACETAMINOPHEN 325 MG PO TABS: 325.0000 mg | ORAL_TABLET | Freq: Once | ORAL | Status: DC | PRN

## 2020-05-01 MED ORDER — ACETAMINOPHEN 10 MG/ML IV SOLN: 1000.0000 mg | Freq: Once | INTRAVENOUS | Status: DC | PRN

## 2020-05-01 MED ORDER — BUPIVACAINE HCL (PF) 0.25 % IJ SOLN
INTRAMUSCULAR | Status: AC
  Filled 2020-05-01: qty 30

## 2020-05-01 MED ORDER — CHLORHEXIDINE GLUCONATE CLOTH 2 % EX PADS
6.0000 | MEDICATED_PAD | Freq: Once | CUTANEOUS | Status: AC
  Administered 2020-05-01: 6 via TOPICAL

## 2020-05-01 MED ORDER — PROPOFOL 500 MG/50ML IV EMUL
INTRAVENOUS | Status: DC | PRN
  Administered 2020-05-01: 100 ug/kg/min via INTRAVENOUS

## 2020-05-01 MED ORDER — LACTATED RINGERS IV SOLN: INTRAVENOUS | Status: DC

## 2020-05-01 MED ORDER — BUPIVACAINE HCL (PF) 0.25 % IJ SOLN
INTRAMUSCULAR | Status: DC | PRN
  Administered 2020-05-01: 7 mL

## 2020-05-01 MED ORDER — FENTANYL CITRATE (PF) 100 MCG/2ML IJ SOLN
INTRAMUSCULAR | Status: DC | PRN
  Administered 2020-05-01: 50 ug via INTRAVENOUS
  Administered 2020-05-01 (×2): 25 ug via INTRAVENOUS

## 2020-05-01 MED ORDER — LIDOCAINE 2% (20 MG/ML) 5 ML SYRINGE
INTRAMUSCULAR | Status: DC | PRN
  Administered 2020-05-01: 60 mg via INTRAVENOUS

## 2020-05-01 MED ORDER — FENTANYL CITRATE (PF) 250 MCG/5ML IJ SOLN
INTRAMUSCULAR | Status: AC
  Filled 2020-05-01: qty 5

## 2020-05-01 MED ORDER — CHLORHEXIDINE GLUCONATE 0.12 % MT SOLN
15.0000 mL | Freq: Once | OROMUCOSAL | Status: DC
  Filled 2020-05-01: qty 15

## 2020-05-01 MED ORDER — ACETAMINOPHEN 160 MG/5ML PO SOLN: 325.0000 mg | Freq: Once | ORAL | Status: DC | PRN

## 2020-05-01 MED ORDER — ONDANSETRON HCL 4 MG/2ML IJ SOLN
INTRAMUSCULAR | Status: DC | PRN
  Administered 2020-05-01: 4 mg via INTRAVENOUS

## 2020-05-01 SURGICAL SUPPLY — 28 items
BNDG GAUZE ELAST 4 BULKY (GAUZE/BANDAGES/DRESSINGS) IMPLANT
CANISTER SUCT 3000ML PPV (MISCELLANEOUS) ×2 IMPLANT
COVER SURGICAL LIGHT HANDLE (MISCELLANEOUS) ×2 IMPLANT
COVER WAND RF STERILE (DRAPES) ×2 IMPLANT
DRAPE IMP U-DRAPE 54X76 (DRAPES) IMPLANT
DRAPE LAPAROSCOPIC ABDOMINAL (DRAPES) IMPLANT
DRAPE LAPAROTOMY 100X72 PEDS (DRAPES) IMPLANT
DRSG PAD ABDOMINAL 8X10 ST (GAUZE/BANDAGES/DRESSINGS) IMPLANT
ELECT CAUTERY BLADE 6.4 (BLADE) ×2 IMPLANT
ELECT REM PT RETURN 9FT ADLT (ELECTROSURGICAL) ×2
ELECTRODE REM PT RTRN 9FT ADLT (ELECTROSURGICAL) ×1 IMPLANT
GAUZE PACKING IODOFORM 1/2 (PACKING) ×2 IMPLANT
GAUZE SPONGE 4X4 12PLY STRL (GAUZE/BANDAGES/DRESSINGS) ×2 IMPLANT
GLOVE BIO SURGEON STRL SZ7 (GLOVE) ×2 IMPLANT
GLOVE BIOGEL PI IND STRL 7.5 (GLOVE) ×1 IMPLANT
GLOVE BIOGEL PI INDICATOR 7.5 (GLOVE) ×1
GOWN STRL REUS W/ TWL LRG LVL3 (GOWN DISPOSABLE) ×2 IMPLANT
GOWN STRL REUS W/TWL LRG LVL3 (GOWN DISPOSABLE) ×2
KIT BASIN OR (CUSTOM PROCEDURE TRAY) ×2 IMPLANT
KIT TURNOVER KIT B (KITS) ×2 IMPLANT
NS IRRIG 1000ML POUR BTL (IV SOLUTION) ×2 IMPLANT
PACK GENERAL/GYN (CUSTOM PROCEDURE TRAY) ×2 IMPLANT
PAD ABD 8X10 STRL (GAUZE/BANDAGES/DRESSINGS) ×2 IMPLANT
PAD ARMBOARD 7.5X6 YLW CONV (MISCELLANEOUS) ×2 IMPLANT
PENCIL SMOKE EVACUATOR (MISCELLANEOUS) ×2 IMPLANT
TAPE CLOTH 4X10 WHT NS (GAUZE/BANDAGES/DRESSINGS) ×2 IMPLANT
TOWEL GREEN STERILE (TOWEL DISPOSABLE) ×2 IMPLANT
TOWEL GREEN STERILE FF (TOWEL DISPOSABLE) ×2 IMPLANT

## 2020-05-01 NOTE — Consult Note (Signed)
[place] Nurse ostomy follow up Patient receiving care in [place].  Niece, [name], in room. [name] confirms she has been performing ostomy care and pouch changes.  She tried using a pouch with the barrier ring, and soon afterward, the pouch leaked. Now she know she will require a barrier ring at all times.  No new needs identified. Additional pouches and barrier rings placed in room for use. Val Riles, RN, MSN, CWOCN, CNS-BC, pager [phone]

## 2020-05-01 NOTE — Op Note (Signed)
Preoperative diagnosis: wound infection s/p hartmanns with abdominal wall abscess Postoperative diagnosis: saa Procedure: dressing change abdominal wound Incision and drainage of 4 cm abdominal wall abscess right abdominal wall Surgeon: Dr Serita Grammes Anes: local mac EBL; minimal Complications none Specimens none Drains none Sponge and needle count correct dispo recovery stable  Indications:   This is a75 yof s/p hartmanns who has had right abdominal wall cellulitis treated with abx. This has coalesced into a single spot. CT did not show anything concerning. Her wound is not clean right now as she had infection after her wound was closed. She has also dehisced her fascia and I dont think other options for that right now. I was unable to adequately evaluated wound and drain this area on the floor so we discussed doing it in OR.  Procedure: After informed consent obtained she was taken to the OR.  She was already on abx.  She was placed under MAC. She was prepped and draped in standard sterile surgical fashion. Timeout was performed  I debrided some of dead tissue in her wound. I then tunneled via the wound to right lateral abdominal wall where there was murky fluid and purulence.  I also made a counterincision overlying this abscess and completely drained it.  I then placed dressings. She tolerated well and was transferred to recovery stable.

## 2020-05-01 NOTE — Progress Notes (Addendum)
Pharmacy Antibiotic Note  Bailey Walker is a 74 y.o. female admitted on 04/15/2020 with abdominal pain found to have obstructing colorectal carcinoma now s/p sigmoid colectomy with end colostomy. Cellulitis initially diagnosed, but today, wound infection suspected, and pharmacy has been consulted for meropenem dosing.  Today, patient back to OR to drain abdominal wall abscess. WBC remain WNL, afebrile. CT scan 8/9 shows no organized fluid collections; midline ventral wound dehiscence. Given clinical stability and no history of ESBL, consider deescalating to agents with coverage against enteric pathogens such as ceftriaxone and metronidazole.   Plan: Continue meropenem 1g IV q8h Monitor clinical picture and renal function F/U C&S, abx de-escalation after post-op, LOT   Height: 5' 0.98" (154.9 cm) Weight: 73.8 kg (162 lb 11.2 oz) IBW/kg (Calculated) : 47.76  Temp (24hrs), Avg:98.4 F (36.9 C), Min:98.2 F (36.8 C), Max:98.8 F (37.1 C)  Recent Labs  Lab 04/25/20 0219 04/25/20 0219 04/26/20 0444 04/26/20 0444 04/27/20 0835 04/28/20 0301 04/29/20 0140 04/30/20 0648 05/01/20 0111  WBC 10.6*   < > 9.6   < > 8.5 9.2 9.1 8.5 8.9  CREATININE 0.49  --  0.56  --  0.43*  --  0.43* 0.51  --    < > = values in this interval not displayed.    Estimated Creatinine Clearance: 55 mL/min (by C-G formula based on SCr of 0.51 mg/dL).    Allergies  Allergen Reactions  . Nitroglycerin Nausea And Vomiting  . Ampicillin Other (See Comments)    Per allergy test    Antimicrobials this admission: 7/26 CTX x1 7/29 cefoxitin> 7/30 8/3 doxy>8/4 8/4 meropenem>  Microbiology results: 8/6 UCx: negative    Brendolyn Patty, PharmD Clinical Pharmacist  05/01/2020   3:08 PM   Please check AMION for all [place] phone numbers After 10:00 PM, call the [place] [phone]

## 2020-05-01 NOTE — Anesthesia Procedure Notes (Signed)
Procedure Name: MAC Date/Time: 05/01/2020 1:10 PM Performed by: Alain Marion, CRNA Pre-anesthesia Checklist: Patient identified, Emergency Drugs available, Suction available, Patient being monitored and Timeout performed Oxygen Delivery Method: Simple face mask Placement Confirmation: positive ETCO2

## 2020-05-01 NOTE — Progress Notes (Signed)
PROGRESS NOTE    Bailey Walker  LKG:[id number] DOB: 08-Nov-1945 DOA: 04/15/2020 PCP: [name], MD    Brief Narrative:  74 y.o.femalewith medical history significant ofhypertension, type 2 diabetes mellitus, hypothyroidism, obesity presents to emergency department with abdominal pain and vomiting since 2 days.  Patient speaks Arabic. History gathered from patient's daughter at the bedside. She mentioned that patient has severe generalized abdominal pain and vomiting since July 4 however it got worse since 2 days. She could not keep anything down, she has 10 out of 10, nonradiating, sharp pain, no aggravating or relieving factors, associated with vomiting 4 times since yesterday. Vomitus is nonbloody. She has lost about 7 to 10 pounds since July for the due to abdominal pain and vomiting. No history of colon cancer in the family. No night sweats, melena, fever, chills, headache, blurry vision, chest pain, shortness of breath, palpitation, leg swelling.  Reports dysuria since few days. No foul-smelling or change in urinary frequency, lower back pain. She had regular bowel movement this morning.  She is a former smoker. No history of alcohol, illicit drug use.  ED Course:Upon arrival to ED: Patient's vital signs stable. She received IV fluid bolus, Protonix, Zofran, Dilaudid as needed for pain control and Rocephin for possible UTI in ED. 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 Patient transferred to [facility] for further evaluation and management.    Consultants:   Surgery, wound nsg  Procedures:   Antimicrobials:   Meropenem   Subjective: Pt has no complaints. No n/v/abd pain.   Objective: Vitals:   05/01/20 1352 05/01/20 1407 05/01/20 1422 05/01/20 1448  BP: (!) 119/59 (!) 123/59 (!) 123/57 112/61  Pulse: 80 74 75 71  Resp: 15 15 14 16   Temp:    98.2 F (36.8 C)  TempSrc:    Oral  SpO2:  95% 96% 97% 100%  Weight:      Height:        Intake/Output Summary (Last 24 hours) at 05/01/2020 1703 Last data filed at 05/01/2020 1337 Gross per 24 hour  Intake 310 ml  Output --  Net 310 ml   Filed Weights   04/29/20 0500 04/30/20 0500 05/01/20 1229  Weight: 73.5 kg 73.8 kg 73.8 kg    Examination:  General exam: nad, calm, comfortable. Respiratory system:  cta no w/r/r cardiovascular system: RRR s1/s2, no murmurs Gastrointestinal system: Mid abdomen dressing in place.  LQ colostomy in plae with brown stool +bs. Erythema of lower quads outside of dressing better. Central nervous system: Awake and alert, grossly intact. Extremities: no edema or cyanosis Skin: Warm and dry Psychiatry:  Mood & affect appropriate in current setting    Data Reviewed: I have personally reviewed following labs and imaging studies  CBC: Recent Labs  Lab 04/27/20 0835 04/28/20 0301 04/29/20 0140 04/30/20 0648 05/01/20 0111  WBC 8.5 9.2 9.1 8.5 8.9  NEUTROABS 5.3 5.5 5.2 5.3 6.0  HGB 9.2* 9.4* 9.5* 9.8* 9.6*  HCT 30.2* 30.0* 31.3* 31.9* 31.6*  MCV 91.0 90.6 89.9 90.4 90.3  PLT 374 390 431* 424* 664*   Basic Metabolic Panel: Recent Labs  Lab 04/25/20 0219 04/25/20 0219 04/26/20 0444 04/26/20 2014 04/27/20 0835 04/28/20 0301 04/29/20 0140 04/30/20 0648 05/01/20 0111  NA 134*  --  137  --  136  --  136  --   --   K 3.0*  --  2.8*  --  4.3  --  4.7  --   --  CL 101  --  104  --  105  --  103  --   --   CO2 21*  --  24  --  23  --  24  --   --   GLUCOSE 115*  --  125*  --  112*  --  116*  --   --   BUN <5*  --  <5*  --  <5*  --  5*  --   --   CREATININE 0.49  --  0.56  --  0.43*  --  0.43* 0.51  --   CALCIUM 6.6*  --  6.5*  --  6.5*  --  7.6*  --   --   MG 2.0   < > 1.9  --  1.8 1.9 2.0 2.3 2.1  PHOS 1.3*   < > 1.4*   < > 2.6 2.4* 3.3 3.5 3.9   < > = values in this interval not displayed.   GFR: Estimated Creatinine Clearance: 55 mL/min (by C-G formula based on SCr of 0.51  mg/dL). Liver Function Tests: No results for input(s): AST, ALT, ALKPHOS, BILITOT, PROT, ALBUMIN in the last 168 hours. No results for input(s): LIPASE, AMYLASE in the last 168 hours. No results for input(s): AMMONIA in the last 168 hours. Coagulation Profile: No results for input(s): INR, PROTIME in the last 168 hours. Cardiac Enzymes: No results for input(s): CKTOTAL, CKMB, CKMBINDEX, TROPONINI in the last 168 hours. BNP (last 3 results) No results for input(s): PROBNP in the last 8760 hours. HbA1C: No results for input(s): HGBA1C in the last 72 hours. CBG: Recent Labs  Lab 04/30/20 2129 05/01/20 0814 05/01/20 1156 05/01/20 1343 05/01/20 1652  GLUCAP 128* 121* 102* 100* 92   Lipid Profile: No results for input(s): CHOL, HDL, LDLCALC, TRIG, CHOLHDL, LDLDIRECT in the last 72 hours. Thyroid Function Tests: No results for input(s): TSH, T4TOTAL, FREET4, T3FREE, THYROIDAB in the last 72 hours. Anemia Panel: No results for input(s): VITAMINB12, FOLATE, FERRITIN, TIBC, IRON, RETICCTPCT in the last 72 hours. Sepsis Labs: No results for input(s): PROCALCITON, LATICACIDVEN in the last 168 hours.  Recent Results (from the past 240 hour(s))  Culture, Urine     Status: None   Collection Time: 04/27/20  3:44 PM   Specimen: Urine, Catheterized  Result Value Ref Range Status   Specimen Description URINE, CATHETERIZED  Final   Special Requests NONE  Final   Culture   Final    NO GROWTH Performed at [facility], [address], [place], [place] [address]    Report Status 04/28/2020 FINAL  Final         Radiology Studies: CT ABDOMEN PELVIS W CONTRAST  Result Date: 04/30/2020 CLINICAL DATA:  Intra-abdominal abscess.  Vomiting. EXAM: CT ABDOMEN AND PELVIS WITH CONTRAST TECHNIQUE: Multidetector CT imaging of the abdomen and pelvis was performed using the standard protocol following bolus administration of intravenous contrast. CONTRAST:  131mL OMNIPAQUE IOHEXOL 300 MG/ML  SOLN  COMPARISON:  CT chest 04/22/2020 FINDINGS: Lower chest: Lung bases are clear. Hepatobiliary: No focal hepatic lesion. No biliary duct dilatation. Common bile duct is normal. Pancreas: Pancreas is normal. No ductal dilatation. No pancreatic inflammation. Spleen: Normal spleen Adrenals/urinary tract: Adrenal glands and kidneys are normal. The ureters and bladder normal. Stomach/Bowel: Small hiatal hernia. Stomach, duodenum small-bowel normal. Appendix not identified. Colon is predominantly collapsed. There is mild bowel wall thickening throughout the entire colon. LEFT abdominal wall colostomy. No obstruction. Rectal pouch noted without  complication. Vascular/Lymphatic: Abdominal aorta is normal caliber with atherosclerotic calcification. There is no retroperitoneal or periportal lymphadenopathy. No pelvic lymphadenopathy. Lymph node within the bowel mesentery anterior to the LEFT psoas muscle measures 7 mm on image 50/3). Reproductive: Uterus and adnexa unremarkable. Other: Midline ventral wound dehiscence. No organized fluid collections along the ventral abdominal wall. No peritoneal abscess or extension. Peritoneal nodule in the ventral RIGHT abdomen anterior to the ascending colon measures 8 mm (48/3). Musculoskeletal: No aggressive osseous lesion IMPRESSION: 1. Midline ventral wound dehiscence. No organized fluid collections within the subcutaneous tissue, muscle or fascial layer. No intra-abdominal extension identified. 2. Post partial LEFT colectomy for sigmoid colon mass. Small residual lymph node in the LEFT abdominal mesentery. 3. Small peritoneal nodule in the RIGHT ventral peritoneal surface. Cannot exclude a peritoneal metastasis. 4. Mild diffuse colon wall thickening could indicate mild colitis. Electronically Signed   By: Suzy Bouchard M.D.   On: 04/30/2020 13:00        Scheduled Meds:  aspirin EC  81 mg Oral Daily   docusate sodium  100 mg Oral BID   enoxaparin (LOVENOX) injection  40  mg Subcutaneous Q24H   feeding supplement (ENSURE ENLIVE)  237 mL Oral TID BM   insulin aspart  0-20 Units Subcutaneous TID AC & HS   levothyroxine  75 mcg Oral QAC breakfast   lidocaine  1 patch Transdermal Q24H   metoprolol tartrate  25 mg Oral BID   multivitamin with minerals  1 tablet Oral Daily   pantoprazole  40 mg Oral Daily   pneumococcal 23 valent vaccine  0.5 mL Intramuscular Tomorrow-1000   Continuous Infusions:  lactated ringers 20 mL/hr at 04/18/20 0940   meropenem (MERREM) IV 1 g (05/01/20 1027)   methocarbamol (ROBAXIN) IV 500 mg (05/01/20 0858)    Assessment & Plan:   Principal Problem:   Colon obstruction ([place]) Active Problems:   Hypothyroidism   Essential hypertension   Diabetes mellitus due to underlying condition with unspecified complications ([place])   UTI (urinary tract infection)   Abdominal distension   Nausea and vomiting in adult   Abnormal CT scan, gastrointestinal tract   Gastritis and gastroduodenitis   Grade II internal hemorrhoids   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  Gastritis noted on EGD. Flex sig  confirmed obstructing colonic mass -General surgery following --s/p sigmoid colectomy and end colostomy by Dr. Ninfa Linden on 7/30- POD #11 -Surgical pathology confirms invasive adenocarcinoma.metastatic carcinoma of 1/15 lymph notes and 1 satellite nodule. Pre-opCEA mildly elevated at 14. Dr. Marin Olp following. CT chest without metastasis. Med Onc saw 8/3 and will follow. -Advancing diet per general surgery. Empiric doxycycline started 8/3 by general surgery----Midline wound opened with evacuation of hematoma 8/2. BID WTD dressing changes.Not a good candidate for vac with tunneling of wound.  -CT chest without evidence  of metastatic disease noted -Oncology following, likely plan for chemo once surgical incision heals Continue taking small meals nongreasy and encourage several shakes a day per surgery's recommendation Cellulitis slowly with dressing and improving CT Scan 8/9-shows no organized fluid collections within the subcutaneous tissue, muscle or fascial layer; midline ventral wound dehiscence. Plan: OR today for wound exploration Continue IV Meropenem   UTI-patient reported dysuria. UA is positive for leukocyte/bacteria/WBC -She received Rocephin in ED. Completed empiric course of rocephin.  Urine culture results are indeterminate.  8/6 patient complaining of dysuria again. Possibly abdominal pressure causing sx? Also on meropenem for abd cellulitis which would tx uti too UA negative, ucx neg.   Hypertension: Stable , continue with metoprolol  Hypothyroidism:  TSH elevated at 15.312  Free T4 normal Continue levothyroxine Repeat thyroid lab as outpt in 4 weeks after acute illness resolves.   Type 2 diabetes mellitus: Hold glipizide and Metformin. Blood glucose levels are stable  Continue R ISS, check protocol    Iron deficiency anemia: H&H is stable at this time -Continue ferrous sulfate ? I dont see this in mar. Will d/w pharm -Followed by hematology as outpatient Dr. Alvy Bimler H&H stable.  Periodically will check CBC   Hypokalemia/Hypophosh.- Was replaced  K 4.7 Magnesium 2.1 phophate 3.9 We will continue to monitor    DVT prophylaxis: Lovenox subq Code Status: Full Family Communication:  Niece updated at bedside  Status is: Inpatient  Remains inpatient appropriate because:IV treatments appropriate due to intensity of illness or inability to take PO   Dispo: The patient is from: Home              Anticipated d/c is to: Home with [facility] [place]              Anticipated d/c date is: 3 days              Patient currently is not medically stable to d/c.  Still needs IV  antibiotics and going to the OR today         LOS: 15 days   Time spent: 35 minutes with more than 50% on [place], MD [facility] Pager [phone]  If 7PM-7AM, please contact night-coverage [email or web address] Password [facility] 05/01/2020, 5:03 PM

## 2020-05-01 NOTE — Anesthesia Preprocedure Evaluation (Addendum)
Anesthesia Evaluation  Patient identified by MRN, date of birth, ID band Patient awake    Reviewed: Allergy & Precautions, NPO status , Patient's Chart, lab work & pertinent test results  Airway Mallampati: I  TM Distance: >3 FB Neck ROM: Full    Dental  (+) Edentulous Upper, Edentulous Lower   Pulmonary former smoker,     + decreased breath sounds      Cardiovascular hypertension, Pt. on home beta blockers  Rhythm:Regular Rate:Normal     Neuro/Psych    GI/Hepatic   Endo/Other  diabetes, Type 2, Oral Hypoglycemic AgentsHypothyroidism   Renal/GU   negative genitourinary   Musculoskeletal negative musculoskeletal ROS (+)   Abdominal (+) + obese,   Peds  Hematology negative hematology ROS (+)   Anesthesia Other Findings   Reproductive/Obstetrics                            Anesthesia Physical Anesthesia Plan  ASA: III  Anesthesia Plan: MAC   Post-op Pain Management:    Induction: Intravenous  PONV Risk Score and Plan: 3 and Ondansetron, Propofol infusion and Treatment may vary due to age or medical condition  Airway Management Planned: Simple Face Mask and Natural Airway  Additional Equipment: None  Intra-op Plan:   Post-operative Plan:   Informed Consent: I have reviewed the patients History and Physical, chart, labs and discussed the procedure including the risks, benefits and alternatives for the proposed anesthesia with the patient or authorized representative who has indicated his/her understanding and acceptance.     Dental advisory given and Interpreter used for interveiw  Plan Discussed with: CRNA  Anesthesia Plan Comments: (Daughter served as Astronomer.    Consented for MAC and GA.  )        Anesthesia Quick Evaluation

## 2020-05-01 NOTE — Progress Notes (Signed)
[facility] Progress Note  Day of Surgery  Subjective: CC-  No new complaints. Tolerating diet, colostomy functioning. Still has area of erythema distal/right lateral aspect wound.  Objective: Vital signs in last 24 hours: Temp:  [98.2 F (36.8 C)-98.8 F (37.1 C)] 98.8 F (37.1 C) (08/10 0421) Pulse Rate:  [86-107] 86 (08/10 0421) Resp:  [18] 18 (08/10 0421) BP: (99-131)/(65-67) 122/65 (08/10 0421) SpO2:  [97 %-99 %] 97 % (08/10 0421) Weight:  [73.8 kg] 73.8 kg (08/10 1229) Last BM Date: 04/30/20  Intake/Output from previous day: No intake/output data recorded. Intake/Output this shift: No intake/output data recorded.  PE: Gen:  Alert, NAD Pulm:  rate and effort normal Abd: Soft, ND, mild tenderness right abdomen, open midline somewhat dehisced but not eviscerated/ tissue mostly beefy red/ no purulent drainage/ sinus tract tunneling lateral right at distal aspect of wound towards area of erythema seen below but no purulent drainage expressed; ostomy with stool in pouch     Lab Results:  Recent Labs    04/30/20 0648 05/01/20 0111  WBC 8.5 8.9  HGB 9.8* 9.6*  HCT 31.9* 31.6*  PLT 424* 439*   BMET Recent Labs    04/29/20 0140 04/30/20 0648  NA 136  --   K 4.7  --   CL 103  --   CO2 24  --   GLUCOSE 116*  --   BUN 5*  --   CREATININE 0.43* 0.51  CALCIUM 7.6*  --    PT/INR No results for input(s): LABPROT, INR in the last 72 hours. CMP     Component Value Date/Time   NA 136 04/29/2020 0140   NA 139 07/12/2019 1110   K 4.7 04/29/2020 0140   CL 103 04/29/2020 0140   CO2 24 04/29/2020 0140   GLUCOSE 116 (H) 04/29/2020 0140   BUN 5 (L) 04/29/2020 0140   BUN 15 07/12/2019 1110   CREATININE 0.51 04/30/2020 0648   CREATININE 0.74 01/08/2017 1100   CALCIUM 7.6 (L) 04/29/2020 0140   PROT 5.5 (L) 04/23/2020 0256   PROT 7.1 02/11/2019 1455   ALBUMIN 2.6 (L) 04/23/2020 0256   ALBUMIN 4.4 02/11/2019 1455   AST 15 04/23/2020 0256   ALT 18  04/23/2020 0256   ALKPHOS 58 04/23/2020 0256   BILITOT 1.1 04/23/2020 0256   BILITOT <0.2 02/11/2019 1455   GFRNONAA >60 04/30/2020 0648   GFRNONAA 81 01/08/2017 1100   GFRAA >60 04/30/2020 0648   GFRAA >89 01/08/2017 1100   Lipase     Component Value Date/Time   LIPASE 19 04/16/2020 0042       Studies/Results: CT ABDOMEN PELVIS W CONTRAST  Result Date: 04/30/2020 CLINICAL DATA:  Intra-abdominal abscess.  Vomiting. EXAM: CT ABDOMEN AND PELVIS WITH CONTRAST TECHNIQUE: Multidetector CT imaging of the abdomen and pelvis was performed using the standard protocol following bolus administration of intravenous contrast. CONTRAST:  134mL OMNIPAQUE IOHEXOL 300 MG/ML  SOLN COMPARISON:  CT chest 04/22/2020 FINDINGS: Lower chest: Lung bases are clear. Hepatobiliary: No focal hepatic lesion. No biliary duct dilatation. Common bile duct is normal. Pancreas: Pancreas is normal. No ductal dilatation. No pancreatic inflammation. Spleen: Normal spleen Adrenals/urinary tract: Adrenal glands and kidneys are normal. The ureters and bladder normal. Stomach/Bowel: Small hiatal hernia. Stomach, duodenum small-bowel normal. Appendix not identified. Colon is predominantly collapsed. There is mild bowel wall thickening throughout the entire colon. LEFT abdominal wall colostomy. No obstruction. Rectal pouch noted without complication. Vascular/Lymphatic: Abdominal aorta is normal caliber with atherosclerotic  calcification. There is no retroperitoneal or periportal lymphadenopathy. No pelvic lymphadenopathy. Lymph node within the bowel mesentery anterior to the LEFT psoas muscle measures 7 mm on image 50/3). Reproductive: Uterus and adnexa unremarkable. Other: Midline ventral wound dehiscence. No organized fluid collections along the ventral abdominal wall. No peritoneal abscess or extension. Peritoneal nodule in the ventral RIGHT abdomen anterior to the ascending colon measures 8 mm (48/3). Musculoskeletal: No aggressive  osseous lesion IMPRESSION: 1. Midline ventral wound dehiscence. No organized fluid collections within the subcutaneous tissue, muscle or fascial layer. No intra-abdominal extension identified. 2. Post partial LEFT colectomy for sigmoid colon mass. Small residual lymph node in the LEFT abdominal mesentery. 3. Small peritoneal nodule in the RIGHT ventral peritoneal surface. Cannot exclude a peritoneal metastasis. 4. Mild diffuse colon wall thickening could indicate mild colitis. Electronically Signed   By: Suzy Bouchard M.D.   On: 04/30/2020 13:00    Anti-infectives: Anti-infectives (From admission, onward)   Start     Dose/Rate Route Frequency Ordered Stop   04/25/20 1115  [MAR Hold]  meropenem (MERREM) 1 g in sodium chloride 0.9 % 100 mL IVPB     Discontinue     (MAR Hold since Tue 05/01/2020 at 1219.Hold Reason: Transfer to a Procedural area.)   1 g 200 mL/hr over 30 Minutes Intravenous Every 8 hours 04/25/20 1106     04/24/20 0915  doxycycline (VIBRA-TABS) tablet 100 mg  Status:  Discontinued        100 mg Oral Every 12 hours 04/24/20 0850 04/25/20 1106   04/20/20 1200  cefOXitin (MEFOXIN) 2 g in sodium chloride 0.9 % 100 mL IVPB        2 g 200 mL/hr over 30 Minutes Intravenous Every 8 hours 04/20/20 1130 04/20/20 1245   04/19/20 1800  cefOXitin (MEFOXIN) 2 g in sodium chloride 0.9 % 100 mL IVPB  Status:  Discontinued        2 g 200 mL/hr over 30 Minutes Intravenous Every 8 hours 04/19/20 1659 04/20/20 1130   04/17/20 0000  cefTRIAXone (ROCEPHIN) 1 g in sodium chloride 0.9 % 100 mL IVPB        1 g 200 mL/hr over 30 Minutes Intravenous  Once 04/16/20 1604 04/16/20 2347   04/16/20 0115  cefTRIAXone (ROCEPHIN) 1 g in sodium chloride 0.9 % 100 mL IVPB        1 g 200 mL/hr over 30 Minutes Intravenous  Once 04/16/20 0113 04/16/20 0435       Assessment/Plan HTN T2DM Hypothyroidism ABL anemia -Hgb 9.6, stable  - Per TRH -   Adenocarcinoma of the colon  -s/p sigmoid colectomy and end  colostomy by Dr. Ninfa Linden on 7/30 - POD #11 -Path with invasive adenocarcinoma with negative resection margins, metastatic carcinoma of 1/15 lymph notes and 1 satellite nodule.Pre-opCEA mildly elevated at 14. Dr. Marin Olp following. CT chest without metastasis. Med Onc saw 8/3 and will follow -Midline wound opened with evacuation of hematoma8/2. BID WTD dressing changes.Not a good candidate for vac with tunneling of wound - tolerating diet, ostomy funcitoning - CT scan 8/9 shows No organized fluid collections within the subcutaneous tissue, muscle or fascial layer; midline ventral wound dehiscence  FEN: NPO for procedure VTE: SCDs, lovenox ID: rocephin x1. Doxy8/3- 8/4.Merrem8/4 >> Foley - Out Follow-Up - Dr. Ninfa Linden  Dispo - OR today to wound exploration.   LOS: 15 days    [facility] 05/01/2020, 12:47 PM Please see Amion for pager number during  day hours 7:00am-4:30pm

## 2020-05-01 NOTE — Transfer of Care (Signed)
Immediate Anesthesia Transfer of Care Note  Patient: Bailey Walker  Procedure(s) Performed: ABDOMINAL  WOUND EXPLORATION; IRRIGATION AND DEBRIDEMENT WOUND (N/A Abdomen)  Patient Location: PACU  Anesthesia Type:MAC  Level of Consciousness: awake and alert   Airway & Oxygen Therapy: Patient Spontanous Breathing and Patient connected to face mask oxygen  Post-op Assessment: Report given to RN and Post -op Vital signs reviewed and stable  Post vital signs: Reviewed and stable  Last Vitals:  Vitals Value Taken Time  BP 105/53 05/01/20 1337  Temp    Pulse 79 05/01/20 1342  Resp 17 05/01/20 1342  SpO2 95 % 05/01/20 1342  Vitals shown include unvalidated device data.  Last Pain:  Vitals:   05/01/20 0853  TempSrc:   PainSc: 0-No pain      Patients Stated Pain Goal: 0 (37/48/27 0786)  Complications: No complications documented.

## 2020-05-01 NOTE — TOC Progression Note (Signed)
Transition of Care [facility]) - Progression Note    Patient Details  Name: Bailey Walker MRN: [id number] Date of Birth: [birth year]  Transition of Care [facility]) CM/SW [place], [place] Phone Number: 05/01/2020, 2:06 PM  Clinical Narrative:    CSW spoke with pt niece [name] via phone, we discussed ordering pt DME and other needs for delivery home. They are aware we still have not located a [facility] agency to accept pt Medicaid. They have been actively participating in pt care (wound care/ostomy care). CSW will order DME (gel overlay (pt doesn't qualify for mattress), hospital bed, 3n1 and rolling walker for pt. Will discuss chucks and ostomy supplies with RNCM.    Expected Discharge Plan: [place] Barriers to Discharge: Continued Medical Work up, Inadequate or no insurance, Other (comment), No Home Care Agency will accept this patient (new colostomy/wound care needs)  Expected Discharge Plan and Services Expected Discharge Plan: [place] In-house Referral: Clinical Social Work Discharge Planning Services: CM Consult Post Acute Care Choice: Durable Medical Equipment Living arrangements for the past 2 months: Single Family Home  Readmission Risk Interventions Readmission Risk Prevention Plan 04/25/2020  Transportation Screening Complete  PCP or Specialist Appt within 3-5 Days Not Complete  Not Complete comments pending disposition  HRI or Home Care Consult Complete  Social Work Consult for [place] Planning/Counseling Complete  Palliative Care Screening Not Applicable  Medication Review Press photographer) Referral to Pharmacy  Some recent data might be hidden

## 2020-05-01 NOTE — Progress Notes (Signed)
Observed the patient daughter with Ostomy and dressing change. She di very good. Explained most important is to wash hands and change gloves and to keep area as clean as possible. She verbalized understanding. Arthor Captain LPN

## 2020-05-02 ENCOUNTER — Encounter (HOSPITAL_COMMUNITY): Payer: Self-pay | Admitting: General Surgery

## 2020-05-02 DIAGNOSIS — K2990 Gastroduodenitis, unspecified, without bleeding: Secondary | ICD-10-CM

## 2020-05-02 DIAGNOSIS — C772 Secondary and unspecified malignant neoplasm of intra-abdominal lymph nodes: Secondary | ICD-10-CM

## 2020-05-02 LAB — CBC WITH DIFFERENTIAL/PLATELET
Abs Immature Granulocytes: 0.06 10*3/uL (ref 0.00–0.07)
Basophils Absolute: 0.1 10*3/uL (ref 0.0–0.1)
Basophils Relative: 1 %
Eosinophils Absolute: 0.2 10*3/uL (ref 0.0–0.5)
Eosinophils Relative: 3 %
HCT: 30.6 % — ABNORMAL LOW (ref 36.0–46.0)
Hemoglobin: 9.4 g/dL — ABNORMAL LOW (ref 12.0–15.0)
Immature Granulocytes: 1 %
Lymphocytes Relative: 17 %
Lymphs Abs: 1.5 10*3/uL (ref 0.7–4.0)
MCH: 27.9 pg (ref 26.0–34.0)
MCHC: 30.7 g/dL (ref 30.0–36.0)
MCV: 90.8 fL (ref 80.0–100.0)
Monocytes Absolute: 0.9 10*3/uL (ref 0.1–1.0)
Monocytes Relative: 10 %
Neutro Abs: 6.6 10*3/uL (ref 1.7–7.7)
Neutrophils Relative %: 68 %
Platelets: 443 10*3/uL — ABNORMAL HIGH (ref 150–400)
RBC: 3.37 MIL/uL — ABNORMAL LOW (ref 3.87–5.11)
RDW: 15.2 % (ref 11.5–15.5)
WBC: 9.4 10*3/uL (ref 4.0–10.5)
nRBC: 0 % (ref 0.0–0.2)

## 2020-05-02 LAB — COMPREHENSIVE METABOLIC PANEL
ALT: 24 U/L (ref 0–44)
AST: 23 U/L (ref 15–41)
Albumin: 2.7 g/dL — ABNORMAL LOW (ref 3.5–5.0)
Alkaline Phosphatase: 43 U/L (ref 38–126)
Anion gap: 9 (ref 5–15)
BUN: 11 mg/dL (ref 8–23)
CO2: 27 mmol/L (ref 22–32)
Calcium: 8.6 mg/dL — ABNORMAL LOW (ref 8.9–10.3)
Chloride: 100 mmol/L (ref 98–111)
Creatinine, Ser: 0.49 mg/dL (ref 0.44–1.00)
GFR calc Af Amer: >60 mL/min (ref >60)
GFR calc non Af Amer: >60 mL/min (ref >60)
Glucose, Bld: 124 mg/dL — ABNORMAL HIGH (ref 70–99)
Potassium: 4 mmol/L (ref 3.5–5.1)
Sodium: 136 mmol/L (ref 135–145)
Total Bilirubin: 0.2 mg/dL — ABNORMAL LOW (ref 0.3–1.2)
Total Protein: 5.9 g/dL — ABNORMAL LOW (ref 6.5–8.1)

## 2020-05-02 LAB — MAGNESIUM: Magnesium: 2.1 mg/dL (ref 1.7–2.4)

## 2020-05-02 LAB — GLUCOSE, CAPILLARY
Glucose-Capillary: 115 mg/dL — ABNORMAL HIGH (ref 70–99)
Glucose-Capillary: 190 mg/dL — ABNORMAL HIGH (ref 70–99)

## 2020-05-02 LAB — PHOSPHORUS: Phosphorus: 3.8 mg/dL (ref 2.5–4.6)

## 2020-05-02 MED ORDER — METHOCARBAMOL 500 MG PO TABS: 500.0000 mg | ORAL_TABLET | Freq: Three times a day (TID) | ORAL | Status: DC

## 2020-05-02 MED ORDER — ONDANSETRON HCL 4 MG PO TABS: 4.0000 mg | ORAL_TABLET | Freq: Four times a day (QID) | ORAL | 0 refills | Status: DC | PRN

## 2020-05-02 MED ORDER — DOCUSATE SODIUM 100 MG PO CAPS: 100.0000 mg | ORAL_CAPSULE | Freq: Two times a day (BID) | ORAL | 0 refills | Status: DC

## 2020-05-02 MED ORDER — LIDOCAINE 5 % EX PTCH: 1.0000 | MEDICATED_PATCH | CUTANEOUS | 0 refills | Status: DC

## 2020-05-02 MED ORDER — ADULT MULTIVITAMIN W/MINERALS CH: 1.0000 | ORAL_TABLET | Freq: Every day | ORAL | 0 refills | Status: AC

## 2020-05-02 MED ORDER — METHOCARBAMOL 500 MG PO TABS: 500.0000 mg | ORAL_TABLET | Freq: Three times a day (TID) | ORAL | 0 refills | Status: DC | PRN

## 2020-05-02 MED ORDER — ENSURE ENLIVE PO LIQD: 237.0000 mL | Freq: Three times a day (TID) | ORAL | 12 refills | Status: DC

## 2020-05-02 NOTE — TOC Transition Note (Signed)
Transition of Care [facility]) - CM/SW Discharge Note   Patient Details  Name: Bailey Walker MRN: [id number] Date of Birth: Feb 03, 1946  Transition of [facility]) CM/SW Contact:  Verdell Carmine, RN Phone Number: 05/02/2020, 2:54 PM   Clinical Narrative:    Discharging home pick up with car transport. Discussed once again s/s of infection, exudate, fevers, confusion. Fatigue. To call MD if these symptoms occur. To see MD next meek.unable to secure [facility] for this patient. ;. Discussed coughing deep breathing and splinting when doing so. With pillow. Other d./c instructions and reiterations via d/c RN. Has IS  And equipment at home as previously noted.    Final next level of care: Home/Self Care Barriers to Discharge: No [place] will accept this patient   Patient Goals and CMS Choice Patient states their goals for this hospitalization and ongoing recovery are:: for her to go home and have help with wound care CMS Medicare.gov Compare Post Acute Care list provided to:: Other (Comment Required) (no HH agencies at this time) Choice offered to / list presented to :  (pt nieces)  Discharge Placement                  Name of family member notified: pt niece [name] Patient and family notified of of transfer: 05/02/20  Discharge Plan and Services In-house Referral: Clinical Social Work Discharge Planning Services: [employer] Consult Post Acute Care Choice: Durable Medical Equipment, Home Health          DME Arranged: Walker rolling, Gel overlay, Hospital bed, 3-N-1 DME Agency: [employer] Date DME Agency Contacted: 05/02/20 Time DME Agency Contacted: [id number] Representative spoke with at DME Agency: [place] Arranged: RN, Nurse's Aide       Representative spoke with at [place]: no agency at this time  Social Determinants of Health (SDOH) Interventions     Readmission Risk Interventions Readmission Risk Prevention Plan 05/02/2020 04/25/2020  Transportation Screening -  Complete  PCP or Specialist Appt within 3-5 Days - Not Complete  Not Complete comments - pending disposition  HRI or [place] - Complete  Social Work Consult for [place] Planning/Counseling - Complete  Palliative Care Screening - Not Applicable  Medication Review Press photographer) - Referral to Pharmacy  HRI or [place] Complete -  SW Recovery Care/Counseling Consult Complete -  Palliative Care Screening Not Applicable -  [place] Not Applicable -  Some recent data might be hidden

## 2020-05-02 NOTE — TOC Transition Note (Addendum)
Transition of Care [facility]) - CM/SW Discharge Note   Patient Details  Name: Bailey Walker MRN: [id number] Date of Birth: 07-May-1946  Transition of Care [facility]) CM/SW Contact:  Alexander Mt, LCSW Phone Number: 05/02/2020, 12:43 PM   Clinical Narrative:    3:17pm- Able to locate a HHRN to start next week with pt through [facility], have sent [facility] approval to [facility] with Alvis Lemmings to [email or web address]. CSW met with pt nieces at bedside while they were going over discharge instructions. Pt niece [name] states they can manage until next week- they are upset that we are not able to arrange aides. Discussed that Alvis Lemmings may be a good resource for other agencies to provide those services if they cannot private pay for [facility]. Pt discharging today. CCS PA Brooke, Physiological scientist and [facility] leadership aware.   1:24pm- CSW has heard back and it is a denial for [facility]. CSW also received confirmation that pt has been enrolled in ostomy program and the limitations etc have been discussed with family. RNCM Colletta Maryland to assist CSW and f/u with pt family to ensure needed items have been delivered home.  12:43pm- CSW aware pt plan for d/c home today to nieces home at [address] [place] [address].   CSW confirmed delivery of hospital bed, 3n1, gel overlay, rolling walker to home with Thedore Mins, PT with [facility]. CSW has reached out to [facility] RN to ensure enrollment in [place] program for colostomy bags. Pt will need chucks and extra ostomy supplies sent home with her when she leaves. TOC team has consistently tried to find HHRN/Aide under pre-certification completed by this writer and have been unable- pt family has been more active with wound and ostomy care. Today alone we f/u again with:   [facility] Lemmings- message left for Tommi Rumps, previous decline  [place]- spoke w/ Dian Situ, not in network with managed Medicaid, decline  [facility]- spoke with Butch Penny, unless we have paired Medicare pt the office will not  consider  [facility]- spoke w/ Jonelle Sidle, she is reviewing and will f/u, previous decline  [place]- spoke with Hoyle Sauer, unless we have paired Medicare pt the office will not consider, previous decline  [facility]- Sharyn Lull, RNCM, spoke with office and they will review, previous decline  [place], spoke with office and they declined, previous decline    Final next level of care: Home/Self Care Barriers to Discharge: No [place] will accept this patient   Patient Goals and CMS Choice Patient states their goals for this hospitalization and ongoing recovery are:: for her to go home and have help with wound care CMS Medicare.gov Compare Post Acute Care list provided to:: Other (Comment Required) (no HH agencies at this time) Choice offered to / list presented to :  (pt nieces)  Discharge Placement Name of family member notified: pt niece [name] Patient and family notified of of transfer: 05/02/20  Discharge Plan and Services In-house Referral: Clinical Social Work Discharge Planning Services: [employer] Consult Post Acute Care Choice: Durable Medical Equipment, Home Health          DME Arranged: Walker rolling, Gel overlay, Hospital bed, 3-N-1 DME Agency: [employer] Date DME Agency Contacted: 05/02/20 Time DME Agency Contacted: [id number] Representative spoke with at DME Agency: [place]: RN, Nurse's Aide Representative spoke with at [place]: no agency at this time  Readmission Risk Interventions Readmission Risk Prevention Plan 04/25/2020  Transportation Screening Complete  PCP or Specialist  Appt within 3-5 Days Not Complete  Not Complete comments pending disposition  HRI or Home Care Consult Complete  Social Work Consult for [place] Planning/Counseling Complete  Palliative Care Screening Not Applicable  Medication Review (RN Care Manager) Referral to Pharmacy  Some recent data might be hidden

## 2020-05-02 NOTE — TOC Progression Note (Addendum)
Transition of Care [facility]) - Progression Note    Patient Details  Name: Laiyah Exline MRN: [id number] Date of Birth: 04/11/1946  Transition of Care [facility]) CM/SW [place], RN Phone Number: 05/02/2020, 12:58 PM  Clinical Narrative:    Damaris Schooner to [facility], bed delivered yesterday to home. Continue to work on teaching with dressing changes for home , but looking for [facility] services if able, spoke to niece on phone regarding bed and eqipment received bed and ostomy supplies. Only sister knows how to do dressing changes, niece shared that she just cannot do them, gets dizzy when looks at wound. Discussed s/s of infection drainage color consistency fevers,. When to call MD. Needs follow up . Have concern regarding only one person knowing how to do dressing changes. Niece states cannot pay out of pocket for help, "costs too much. "   Expected Discharge Plan: [place] Barriers to Discharge: No [place] will accept this patient  Expected Discharge Plan and Services Expected Discharge Plan: [place] In-house Referral: Clinical Social Work Discharge Planning Services: CM Consult Post Acute Care Choice: Durable Medical Equipment, Home Health Living arrangements for the past 2 months: Single Family Home Expected Discharge Date: 05/02/20               DME Arranged: Gilford Rile rolling, Gel overlay, Hospital bed, 3-N-1 DME Agency: [employer] Date DME Agency Contacted: 05/02/20 Time DME Agency Contacted: [phone] Representative spoke with at DME Agency: Andree Coss HH Arranged: RN, Nurse's Aide       Representative spoke with at [place]: no agency at this time   Social Determinants of Health (SDOH) Interventions    Readmission Risk Interventions Readmission Risk Prevention Plan 04/25/2020  Transportation Screening Complete  PCP or Specialist Appt within 3-5 Days Not Complete  Not Complete comments pending disposition  HRI or  [place] Complete  Social Work Consult for [place] Planning/Counseling Complete  Palliative Care Screening Not Applicable  Medication Review Press photographer) Referral to Pharmacy  Some recent data might be hidden

## 2020-05-02 NOTE — Anesthesia Postprocedure Evaluation (Signed)
Anesthesia Post Note  Patient: Bailey Walker  Procedure(s) Performed: ABDOMINAL  WOUND EXPLORATION; IRRIGATION AND DEBRIDEMENT WOUND (N/A Abdomen)     Patient location during evaluation: PACU Anesthesia Type: MAC Level of consciousness: awake and alert Pain management: pain level controlled Vital Signs Assessment: post-procedure vital signs reviewed and stable Respiratory status: spontaneous breathing, nonlabored ventilation, respiratory function stable and patient connected to nasal cannula oxygen Cardiovascular status: stable and blood pressure returned to baseline Postop Assessment: no apparent nausea or vomiting Anesthetic complications: no   No complications documented.             Effie Berkshire

## 2020-05-02 NOTE — Discharge Summary (Signed)
Physician Discharge Summary  Karol Skarzynski VZC:[id number] DOB: 10-24-1945 DOA: 04/15/2020  PCP: [name], MD  Admit date: 04/15/2020 Discharge date: 05/02/2020  Admitted From: Home Disposition:  Home with Home Health RN  Recommendations for Outpatient Follow-up:  1. Follow up with PCP in 1-2 weeks 2. Follow up with General Surgery within 1-2 weeks 3. Follow up with Medical Oncology Dr. Alvy Bimler within 1-2 weeks 4. Please obtain CMP/CBC, Mag, Phos in one week 5. Please follow up on the following pending results:  Home Health: YES Equipment/Devices: Hospital Bed, 3in1, RW   Discharge Condition: Stable CODE STATUS: FULL CODE Diet recommendation: Soft Heart Healthy Diet    Brief/Interim Summary: 74 y.o.femalewith medical history significant ofhypertension, type 2 diabetes mellitus, hypothyroidism, obesity presents to emergency department with abdominal pain and vomiting since 2 days.  Patient speaks Arabic. History gathered from patient's daughter at the bedside. She mentioned that patient has severe generalized abdominal pain and vomiting since July 4 however it got worse since 2 days. She could not keep anything down, she has 10 out of 10, nonradiating, sharp pain, no aggravating or relieving factors, associated with vomiting 4 times since yesterday. Vomitus is nonbloody. She has lost about 7 to 10 pounds since July for the due to abdominal pain and vomiting. No history of colon cancer in the family. No night sweats, melena, fever, chills, headache, blurry vision, chest pain, shortness of breath, palpitation, leg swelling.  Reports dysuria since few days. No foul-smelling or change in urinary frequency, lower back pain. She had regular bowel movement this morning.  She is a former smoker. No history of alcohol, illicit drug use.  ED Course:Upon arrival to ED: Patient's vital signs stable. She received IV fluid bolus, Protonix, Zofran, Dilaudid as needed for  pain control and Rocephin for possible UTI in ED. 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 Patient transferred to [facility] for further evaluation and management.  **Interim History She underwent a sigmoidoscopy on 04/19/2020 and pathology confirmed adenocarcinoma with mucinous features.  Biopsies from upper endoscopy with non-H. pylori gastritis and a benign hyperplastic polyp was noted.  General surgery was consulted and she underwent a sigmoid colectomy and end colostomy and medical oncology was consulted for further evaluation recommendation.  Today she is postoperative day 12 of sigmoid colectomy with end colostomy done by Dr. Chyrel Masson on 04/20/2020 and she underwent a repeat dressing change with abdominal wound incision and drainage of 4 cm abdominal wall abscess in the right abdominal wall by Dr. Donne Hazel on 05/01/2020.  CT of the chest was done during his hospitalization showed no metastasis.  Medical oncology will follow up in outpatient setting.  She has a midline wound that is open and evacuation of the hematoma on a 10/2019.  General surgery recommending twice daily wet-to-dry dressing changes.  She is not a good candidate for VAC given the tunneling of her wound.  General surgery recommending discharging today as she is completed antibiotics and given doxycycline as well as meropenem during his hospitalization.  She will follow-up with general surgery in outpatient setting and she is stable to be discharged home at this time and TOC work to try to get her home health and they are able to arrange for an Therapist, sports.  Niece was taught how to do dressing changes in the interim.  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 Gastritis noted on EGD. Flex sig  confirmed obstructing colonic mass -General surgery following --s/p sigmoid colectomy and end colostomy by Dr. Ninfa Linden on 7/30- POD #12 -Surgical pathology confirms invasive adenocarcinoma.metastatic carcinoma of 1/15 lymph notes and 1 satellite nodule.Pre-opCEA mildly elevated at 14. Dr. Marin Olp following. CT chest without metastasis. Med Onc saw 8/3 and will follow. -Advancing diet per general surgery. Empiric doxycycline started 8/3 by general surgery----Midline wound opened with evacuation of hematoma8/2. BID WTD dressing changes.Not a good candidate for vac with tunneling of wound.  -CT chest without evidence of metastatic disease noted -Oncology following, likely plan for chemo once surgical incision heals Continue taking small meals nongreasy and encourage several shakes a day per surgery's recommendation Cellulitis slowly with dressing and improving CT Scan 8/9-showsno organized fluid collections within the subcutaneous tissue, muscle or fascial layer; midline ventral wound dehiscence. Plan: Patient underwent today for wound exploration and had an I&D of a 4 cm abdominal wall abscess. Continued IV Meropenem  -General surgery evaluated and recommending discharging the patient home as she is stable from their standpoint.  UTI-patient reported dysuria. UA is positive for leukocyte/bacteria/WBC -She received Rocephin in ED.Completed empiric course of rocephin. Urine culture results are indeterminate.  8/6 patient complaining of dysuria  again. Possibly abdominal pressure causing sx? Also on meropenem for abd cellulitis which would tx uti too and she is completed 7 days of antibiotics. UA negative, ucx neg.  Hypertension: Stable , continue with metoprolol  Hypothyroidism:  TSH elevated at 15.312  Free T4 normal Continue levothyroxine Repeat thyroid lab as outpt in 4 weeks after acute illness resolves.   Type 2 diabetes mellitus: Hold glipizide and Metformin while hospitalized but can resume it discharge. -Blood glucose levels are stable ranging from 92-1 90 Continue R ISS, check protocol  while hospitalized   Iron deficiency anemia: H&H is stable at this time -Continue ferrous sulfate ? I dont see this in mar. Will d/w pharm -Followed by hematology as outpatient Dr. Alvy Bimler and she will follow up with them for her oncology evaluation for her cancer H&H stable and last check was 9.4/3.6 at the day of discharge We will repeat CBC in outpatient setting   Hypokalemia/Hypophosphatemia  -Electrolytes stable at the day of discharge with the potassium of 4.0, magnesium 2.1, and phosphorus of 3.8 Continue monitoring in the outpatient setting  Thrombocytosis -In the setting of reactive abdominal surgery Patient is platelet count went from 439,000 and is now 443,000 of NS-daily monitor and trend in outpatient setting  Obesity -Estimated body mass index is 30.76 kg/m as calculated from the following:   Height as of this encounter: 5' 0.98" (1.549 m).   Weight as of this encounter: 73.8 kg. -Weight loss and dietary counseling given.  Discharge Instructions  Discharge Instructions    Call MD for:  difficulty breathing, headache or visual disturbances   Complete by: As directed    Call MD for:  extreme fatigue   Complete by: As directed    Call MD for:  hives   Complete  by: As directed    Call MD for:  persistant dizziness or light-headedness   Complete by: As directed    Call MD for:  persistant  nausea and vomiting   Complete by: As directed    Call MD for:  redness, tenderness, or signs of infection (pain, swelling, redness, odor or green/yellow discharge around incision site)   Complete by: As directed    Call MD for:  severe uncontrolled pain   Complete by: As directed    Call MD for:  temperature >100.4   Complete by: As directed    Diet - low sodium heart healthy   Complete by: As directed    Discharge instructions   Complete by: As directed    You were cared for by a hospitalist during your hospital stay. If you have any questions about your discharge medications or the care you received while you were in the hospital after you are discharged, you can call the unit and ask to speak with the hospitalist on call if the hospitalist that took care of you is not available. Once you are discharged, your primary care physician will handle any further medical issues. Please note that NO REFILLS for any discharge medications will be authorized once you are discharged, as it is imperative that you return to your primary care physician (or establish a relationship with a primary care physician if you do not have one) for your aftercare needs so that they can reassess your need for medications and monitor your lab values.  Follow up with PCP, General Surgery, and Medical Oncology. Take all medications as prescribed. If symptoms change or worsen please return to the ED for evaluation   Discharge wound care:   Complete by: As directed    Per General Surgery Reccs   Increase activity slowly   Complete by: As directed      Allergies as of 05/02/2020      Reactions   Nitroglycerin Nausea And Vomiting   Ampicillin Other (See Comments)   Per allergy test      Medication List    STOP taking these medications   promethazine 12.5 MG suppository Commonly known as: PHENERGAN     TAKE these medications   alendronate 70 MG tablet Commonly known as: FOSAMAX Take 70 mg by mouth every  Sunday.   aspirin EC 81 MG tablet Take 81 mg by mouth daily. Swallow whole.   denosumab 60 MG/ML Sosy injection Commonly known as: PROLIA Inject 60 mg into the skin every 6 (six) months.   Dexilant 30 MG capsule Generic drug: Dexlansoprazole TAKE 1 CAPSULE BY MOUTH EVERY DAY What changed: how much to take   Dextromethorphan-guaiFENesin 10-100 MG/5ML liquid Take 5 mLs by mouth every 12 (twelve) hours as needed (cough).   docusate sodium 100 MG capsule Commonly known as: COLACE Take 1 capsule (100 mg total) by mouth 2 (two) times daily.   feeding supplement (ENSURE ENLIVE) Liqd Take 237 mLs by mouth 3 (three) times daily between meals.   glipiZIDE 5 MG tablet Commonly known as: GLUCOTROL TAKE 1 TABLET BY MOUTH TWICE A DAY BEFORE MEALS What changed: See the new instructions.   levothyroxine 75 MCG tablet Commonly known as: SYNTHROID TAKE 1 TABLET BY MOUTH EVERY DAY What changed: when to take this   lidocaine 5 % Commonly known as: LIDODERM Place 1 patch onto the skin daily. Remove & Discard patch within 12 hours or as directed by MD   metFORMIN 500 MG tablet Commonly  known as: GLUCOPHAGE Take 0.5 tablets (250 mg total) by mouth 2 (two) times daily with a meal. What changed:   how much to take  when to take this  additional instructions   methocarbamol 500 MG tablet Commonly known as: ROBAXIN Take 1 tablet (500 mg total) by mouth every 8 (eight) hours as needed for muscle spasms.   metoprolol tartrate 25 MG tablet Commonly known as: LOPRESSOR Take 1 tablet (25 mg total) by mouth 2 (two) times daily.   multivitamin with minerals Tabs tablet Take 1 tablet by mouth daily.   nitroGLYCERIN 0.4 MG SL tablet Commonly known as: NITROSTAT Place 1 tablet (0.4 mg total) under the tongue every 5 (five) minutes as needed for chest pain.   ondansetron 4 MG tablet Commonly known as: ZOFRAN Take 1 tablet (4 mg total) by mouth every 6 (six) hours as needed for  nausea. What changed:   when to take this  reasons to take this   prednisoLONE acetate 1 % ophthalmic suspension Commonly known as: PRED FORTE Place 1 drop into both eyes See admin instructions. Instill one drop into both eyes on Monday, Wednesday, Friday nights            Durable Medical Equipment  (From admission, onward)         Start     Ordered   04/27/20 1445  For home use only DME Hospital bed  Once       Question Answer Comment  Length of Need 6 Months   Patient has (list medical condition): Adenocarcinoma of the colon s/p sigmoid colectomy and end colostomy 04/20/20   The above medical condition requires: Patient requires the ability to reposition frequently   Bed type Semi-electric   Support Surface: Low Air loss Mattress      04/27/20 1445   04/26/20 0840  For home use only DME Walker rolling  Once       Question Answer Comment  Walker: With [place]   Patient needs a walker to treat with the following condition Physical deconditioning      04/26/20 0839   04/26/20 0840  For home use only DME 3 n 1  Once        04/26/20 0839           Discharge Care Instructions  (From admission, onward)         Start     Ordered   05/02/20 0000  Discharge wound care:       Comments: Per General Surgery Reccs   05/02/20 1238          Follow-up [facility] Go on 05/08/2020.   Specialty: General Surgery Why: at 2:15pm for wound check please arrive 30 minutes early to get checked in and fill out any necessary paperwork Contact information: [address] [place] [phone]       Coralie Keens, MD. Go on 05/15/2020.   Specialty: General Surgery Why: at 2:40 PM for post-operative follow up appointment with your surgeon. please arrive 15 minutes early. Contact information: [place] [address] [phone]        [name], MD. Schedule  an appointment as soon as possible for a visit.   Specialty: Family Medicine Contact information: [place] [address] [phone]        [name], MD. Call.   Specialty: Family Medicine Why: Follow up  within 1 week Contact information: [address] [place] [address] [phone]        Heath Lark, MD. Call.   Specialty: Hematology and Oncology Why: Follow up within 1-2 weeks for outpatient follow up Contact information: [place] [address] [phone]              Allergies  Allergen Reactions  . Nitroglycerin Nausea And Vomiting  . Ampicillin Other (See Comments)    Per allergy test    Consultations:  General surgery  Gastroenterology  Medical oncology  Procedures/Studies: DG Abd 1 View  Result Date: 04/18/2020 CLINICAL DATA:  Nasogastric tube placement EXAM: ABDOMEN - 1 VIEW COMPARISON:  None. FLUOROSCOPY TIME:  3 minutes 18 seconds; 23.70 mGy; 1 acquired image FINDINGS: Nasogastric tube tip and side port are in the distal stomach. Relative paucity of gas seen on somewhat limited supine abdominal image. No free air appreciable. Visualized lung bases clear. IMPRESSION: Nasogastric tube tip and side port in distal stomach. Paucity of gas of uncertain etiology on limited study. No free air evident on current supine examination. Electronically Signed   By: Lowella Grip III M.D.   On: 04/18/2020 07:54   CT CHEST W CONTRAST  Result Date: 04/22/2020 CLINICAL DATA:  Staging for cancer of unknown primary. CT of abdomen and pelvis dated 04/10/2020 showed a constricting type mass of the sigmoid colon. EXAM: CT CHEST WITH CONTRAST TECHNIQUE: Multidetector CT imaging of the chest was performed during intravenous contrast administration. CONTRAST:  35mL OMNIPAQUE IOHEXOL 300 MG/ML  SOLN COMPARISON:  Abdomen and pelvis CT, 04/16/2020. CT neck dated 08/13/2017. FINDINGS: Cardiovascular: Heart is normal in size.  Trace pericardial fluid. Mild right and circumflex coronary artery calcifications. Great vessels are normal in caliber. Mild aortic atherosclerosis. Mediastinum/Nodes: Periphery calcified 1.2 cm left thyroid nodule. No follow-up recommended. No neck base, axillary, mediastinal or hilar masses or enlarged lymph nodes. Trachea and esophagus are unremarkable. Lungs/Pleura: Trace pleural effusions. Nodular opacity at the right anterior apex, measuring 1.3 x 1.1 x 1.3 cm, stable from the neck CT dated 08/13/2017, benign. No other lung nodules. Dependent opacity in the lower lobes and at the diaphragmatic bases of the right middle lobe and left upper lobe lingula consistent with atelectasis. Remainder of the lungs is clear. Upper Abdomen: Small hiatal hernia. Trace ascites adjacent to the liver and spleen. 6 mm, partly imaged, enhancing lesion in the right lobe, segment 6, nonspecific and not visualized on the prior study likely due to differences in enhancement timing. Musculoskeletal: No fracture. No osteoblastic or osteolytic lesions. IMPRESSION: 1. No evidence of metastatic disease to the chest or of a primary malignancy. 2. 1.3 cm nodule at the apex of the right upper lobe, which has been stable since November 2018, benign. 3. No acute findings. 4. Trace pleural effusions and dependent lung atelectasis. No evidence of pneumonia or pulmonary edema. 5. Mild coronary artery calcifications and aortic atherosclerosis. Aortic Atherosclerosis (ICD10-I70.0). Electronically Signed   By: Lajean Manes M.D.   On: 04/22/2020 11:40   CT ABDOMEN PELVIS W CONTRAST  Result Date: 04/30/2020 CLINICAL DATA:  Intra-abdominal abscess.  Vomiting. EXAM: CT ABDOMEN AND PELVIS WITH CONTRAST TECHNIQUE: Multidetector CT imaging of the abdomen and pelvis was performed using the standard protocol following bolus administration of intravenous contrast. CONTRAST:  158mL OMNIPAQUE IOHEXOL 300 MG/ML  SOLN COMPARISON:  CT chest 04/22/2020  FINDINGS: Lower chest: Lung bases are clear. Hepatobiliary: No focal hepatic lesion. No biliary duct dilatation. Common bile duct  is normal. Pancreas: Pancreas is normal. No ductal dilatation. No pancreatic inflammation. Spleen: Normal spleen Adrenals/urinary tract: Adrenal glands and kidneys are normal. The ureters and bladder normal. Stomach/Bowel: Small hiatal hernia. Stomach, duodenum small-bowel normal. Appendix not identified. Colon is predominantly collapsed. There is mild bowel wall thickening throughout the entire colon. LEFT abdominal wall colostomy. No obstruction. Rectal pouch noted without complication. Vascular/Lymphatic: Abdominal aorta is normal caliber with atherosclerotic calcification. There is no retroperitoneal or periportal lymphadenopathy. No pelvic lymphadenopathy. Lymph node within the bowel mesentery anterior to the LEFT psoas muscle measures 7 mm on image 50/3). Reproductive: Uterus and adnexa unremarkable. Other: Midline ventral wound dehiscence. No organized fluid collections along the ventral abdominal wall. No peritoneal abscess or extension. Peritoneal nodule in the ventral RIGHT abdomen anterior to the ascending colon measures 8 mm (48/3). Musculoskeletal: No aggressive osseous lesion IMPRESSION: 1. Midline ventral wound dehiscence. No organized fluid collections within the subcutaneous tissue, muscle or fascial layer. No intra-abdominal extension identified. 2. Post partial LEFT colectomy for sigmoid colon mass. Small residual lymph node in the LEFT abdominal mesentery. 3. Small peritoneal nodule in the RIGHT ventral peritoneal surface. Cannot exclude a peritoneal metastasis. 4. Mild diffuse colon wall thickening could indicate mild colitis. Electronically Signed   By: Suzy Bouchard M.D.   On: 04/30/2020 13:00   CT Abdomen Pelvis W Contrast  Result Date: 04/16/2020 CLINICAL DATA:  Abdominal pain.  Emesis. EXAM: CT ABDOMEN AND PELVIS WITH CONTRAST TECHNIQUE: Multidetector CT  imaging of the abdomen and pelvis was performed using the standard protocol following bolus administration of intravenous contrast. CONTRAST:  141mL OMNIPAQUE IOHEXOL 300 MG/ML  SOLN COMPARISON:  March 25, 2020 FINDINGS: Lower chest: The lung bases are clear. The heart is enlarged. Hepatobiliary: The liver is normal. Normal gallbladder.There is no biliary ductal dilation. Pancreas: Normal contours without ductal dilatation. No peripancreatic fluid collection. Spleen: Unremarkable. Adrenals/Urinary Tract: --Adrenal glands: Unremarkable. --Right kidney/ureter: No hydronephrosis or radiopaque kidney stones. --Left kidney/ureter: No hydronephrosis or radiopaque kidney stones. --Urinary bladder: Unremarkable. Stomach/Bowel: --Stomach/Duodenum: There is a small to moderate-sized hiatal hernia. Again noted are thickened rugal folds similar to prior study. --Small bowel: There are dilated loops of small bowel involving the distal and terminal ileum. These loops of small bowel demonstrate fecalization consistent with slow transit. --Colon: . There is significant distention of the colon which has progressed since the prior study. There is a large amount of stool in the colon. There is an apparent transition point in the sigmoid colon (axial series 2, image 73). At this level, there is wall thickening of the sigmoid colon. There are surrounding enlarged regional lymph nodes. There is an additional focal area of narrowing involving the ascending colon (axial series 2, image 45). There are adjacent mildly enlarged regional lymph nodes. --Appendix: Not visualized. No right lower quadrant inflammation or free fluid. Vascular/Lymphatic: Atherosclerotic calcification is present within the non-aneurysmal abdominal aorta, without hemodynamically significant stenosis. --No retroperitoneal lymphadenopathy. --there are enlarged regional lymph nodes about the sigmoid colon. There are enlarged lymph nodes along the course of the IMV (axial  series 2, image 58). These lymph nodes measure up to approximately 0.9 cm. --No pelvic or inguinal lymphadenopathy. Reproductive: Unremarkable Other: No ascites or free air. The abdominal wall is normal. Musculoskeletal. There is degenerative disc disease throughout the lumbar spine. There is a posterior disc osteophyte complex at the L4-L5 level resulting in spinal canal stenosis. There is no acute displaced fracture. IMPRESSION: 1. Large amount of stool in the colon with  fecalization of the distal and terminal ileum. 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 There are mildly enlarged regional and IMV lymph nodes concerning for nodal metastatic disease. Surgical consultation is recommended. 2. Additional focal area of narrowing involving the ascending colon with adjacent mildly enlarged lymph nodes as detailed above. While this may be secondary to underdistention or peristalsis, an additional mass is not excluded. 3. No free air free fluid. Additional chronic findings as detailed above Aortic Atherosclerosis (ICD10-I70.0). These results were called by telephone at the time of interpretation on 04/16/2020 at 2:22 am to provider [facility] , who verbally acknowledged these results. Electronically Signed   By: Constance Holster M.D.   On: 04/16/2020 02:24    EGD Findings:      The examined esophagus was normal. NG tube in place and terminating in       the gastric antrum. This was intentionally left in place upon endoscope       withdrawal.      Diffuse moderate inflammation characterized by congestion (edema) and       erythema was found in the gastric fundus and in the gastric body.       Biopsies were taken with a cold forceps for histology. Estimated blood       loss was minimal.      Two 4 mm sessile polyps with no bleeding and no stigmata of recent       bleeding were found in the  cardia and in the gastric fundus. Biopsies       were taken with a cold forceps for histology. Estimated blood loss was       minimal.      The prepyloric region of the stomach and pylorus were normal.      The duodenal bulb, first portion of the duodenum and second portion of       the duodenum were normal. Impression:               - Normal esophagus.                           - Gastritis. Biopsied.                           - Two gastric polyps. Biopsied.                           - Normal prepyloric region of the stomach and                            pylorus.                           - Normal duodenal bulb, first portion of the                            duodenum and second portion of the duodenum.  SIGMOIDOSCOPY Findings:      Hemorrhoids were found on perianal exam.      A frond-like/villous and fungating completely obstructing large mass was       found in the sigmoid colon, located approximately 28 cm from the anal  verge. This was non-traversable. This was biopsied with a cold forceps       for histology. Area 2-3 cm distal to the mass was tattooed with an       injection of 2 mL of Spot (carbon black). Estimated blood loss was       minimal.      A large amount of solid stool was found in the rectum, in the       recto-sigmoid colon and in the sigmoid colon, precluding visualization.       Lavage of the area was performed using copious amounts of sterile water,       resulting in incomplete clearance with continued poor visualization. Impression:               - Preparation of the colon was poor.                           - Hemorrhoids found on perianal exam.                           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 Biopsied. Tattooed.                           - Stool in the rectum, in the recto-sigmoid colon                            and in the sigmoid colon.  Subjective: Seen and examined at bedside and  she is doing better today.  No nausea or vomiting.  Denies lightheadedness or dizziness.  No chest pain.  Felt well and ready to go home.  No other concerns or close at the time.  Discharge Exam: Vitals:   05/02/20 0444 05/02/20 1433  BP: (!) 103/58 100/64  Pulse: 80 (!) 108  Resp: 16 16  Temp: 98.5 F (36.9 C) 97.9 F (36.6 C)  SpO2: 98% 100%   Vitals:   05/01/20 2046 05/02/20 0136 05/02/20 0444 05/02/20 1433  BP: (!) 119/56 (!) 116/57 (!) 103/58 100/64  Pulse: (!) 104 80 80 (!) 108  Resp: 16 16 16 16   Temp: 98 F (36.7 C) 98.2 F (36.8 C) 98.5 F (36.9 C) 97.9 F (36.6 C)  TempSrc: Oral Oral Oral Oral  SpO2: 98% 96% 98% 100%  Weight:      Height:       General: Pt is alert, awake, not in acute distress Cardiovascular: RRR, S1/S2 +, no rubs, no gallops Respiratory: Diminished bilaterally, no wheezing, no rhonchi Abdominal: Soft, slightly tender, distended secondary body habitus, bowel sounds + has a colostomy bag and midline abdominal incision that is open Extremities: Minimal edema, no cyanosis  The results of significant diagnostics from this hospitalization (including imaging, microbiology, ancillary and laboratory) are listed below for reference.    Microbiology: Recent Results (from the past 240 hour(s))  Culture, Urine     Status: None   Collection Time: 04/27/20  3:44 PM   Specimen: Urine, Catheterized  Result Value Ref Range Status   Specimen Description URINE, CATHETERIZED  Final   Special Requests NONE  Final   Culture   Final  NO GROWTH Performed at [facility], [place] [address] [place], [place] [address]    Report Status 04/28/2020 FINAL  Final    Labs: BNP (last 3 results) No results for input(s): BNP in the last 8760 hours. Basic Metabolic Panel: Recent Labs  Lab 04/26/20 0444 04/26/20 2014 04/27/20 0835 04/27/20 0835 04/28/20 0301 04/29/20 0140 04/30/20 0648 05/01/20 0111 05/02/20 0233  NA 137  --  136  --   --  136  --   --   136  K 2.8*  --  4.3  --   --  4.7  --   --  4.0  CL 104  --  105  --   --  103  --   --  100  CO2 24  --  23  --   --  24  --   --  27  GLUCOSE 125*  --  112*  --   --  116*  --   --  124*  BUN <5*  --  <5*  --   --  5*  --   --  11  CREATININE 0.56  --  0.43*  --   --  0.43* 0.51  --  0.49  CALCIUM 6.5*  --  6.5*  --   --  7.6*  --   --  8.6*  MG 1.9  --  1.8   < > 1.9 2.0 2.3 2.1 2.1  PHOS 1.4*   < > 2.6   < > 2.4* 3.3 3.5 3.9 3.8   < > = values in this interval not displayed.   Liver Function Tests: Recent Labs  Lab 05/02/20 0233  AST 23  ALT 24  ALKPHOS 43  BILITOT 0.2*  PROT 5.9*  ALBUMIN 2.7*   No results for input(s): LIPASE, AMYLASE in the last 168 hours. No results for input(s): AMMONIA in the last 168 hours. CBC: Recent Labs  Lab 04/28/20 0301 04/29/20 0140 04/30/20 0648 05/01/20 0111 05/02/20 0233  WBC 9.2 9.1 8.5 8.9 9.4  NEUTROABS 5.5 5.2 5.3 6.0 6.6  HGB 9.4* 9.5* 9.8* 9.6* 9.4*  HCT 30.0* 31.3* 31.9* 31.6* 30.6*  MCV 90.6 89.9 90.4 90.3 90.8  PLT 390 431* 424* 439* 443*   Cardiac Enzymes: No results for input(s): CKTOTAL, CKMB, CKMBINDEX, TROPONINI in the last 168 hours. BNP: Invalid input(s): POCBNP CBG: Recent Labs  Lab 05/01/20 1343 05/01/20 1652 05/01/20 2048 05/02/20 0822 05/02/20 1202  GLUCAP 100* 92 147* 115* 190*   D-Dimer No results for input(s): DDIMER in the last 72 hours. Hgb A1c No results for input(s): HGBA1C in the last 72 hours. Lipid Profile No results for input(s): CHOL, HDL, LDLCALC, TRIG, CHOLHDL, LDLDIRECT in the last 72 hours. Thyroid function studies No results for input(s): TSH, T4TOTAL, T3FREE, THYROIDAB in the last 72 hours.  Invalid input(s): FREET3 Anemia work up No results for input(s): VITAMINB12, FOLATE, FERRITIN, TIBC, IRON, RETICCTPCT in the last 72 hours. Urinalysis    Component Value Date/Time   COLORURINE STRAW (A) 04/27/2020 [place] 04/27/2020 1744   LABSPEC 1.010 04/27/2020  1744   PHURINE 7.0 04/27/2020 1744   GLUCOSEU NEGATIVE 04/27/2020 1744   HGBUR NEGATIVE 04/27/2020 [place] 04/27/2020 1744   BILIRUBINUR neg 05/16/2019 [place] 04/27/2020 1744   PROTEINUR NEGATIVE 04/27/2020 1744   UROBILINOGEN 0.2 05/16/2019 1446   UROBILINOGEN 0.2 11/09/2017 1559   NITRITE NEGATIVE 04/27/2020 1744   LEUKOCYTESUR  NEGATIVE 04/27/2020 1744   Sepsis Labs Invalid input(s): PROCALCITONIN,  WBC,  LACTICIDVEN Microbiology Recent Results (from the past 240 hour(s))  Culture, Urine     Status: None   Collection Time: 04/27/20  3:44 PM   Specimen: Urine, Catheterized  Result Value Ref Range Status   Specimen Description URINE, CATHETERIZED  Final   Special Requests NONE  Final   Culture   Final    NO GROWTH Performed at [facility], [address] [place], [place] [address]    Report Status 04/28/2020 FINAL  Final   Time coordinating discharge: 35 minutes  SIGNED:  Kerney Elbe, DO [facility] 05/02/2020, 7:59 PM Pager is on [place]  If 7PM-7AM, please contact night-coverage [email or web address]

## 2020-05-02 NOTE — Progress Notes (Signed)
[facility] Progress Note  1 Day Post-Op  Subjective: CC-  Sitting up in bed waiting on breakfast. Overall feeling ok. Denies n/v. Colostomy functioning.  Objective: Vital signs in last 24 hours: Temp:  [98 F (36.7 C)-98.5 F (36.9 C)] 98.5 F (36.9 C) (08/11 0444) Pulse Rate:  [71-104] 80 (08/11 0444) Resp:  [14-16] 16 (08/11 0444) BP: (103-123)/(56-78) 103/58 (08/11 0444) SpO2:  [95 %-100 %] 98 % (08/11 0444) Weight:  [73.8 kg] 73.8 kg (08/10 1229) Last BM Date: 05/01/20  Intake/Output from previous day: 08/10 0701 - 08/11 0700 In: 310 [I.V.:310] Out: 50 [Stool:50] Intake/Output this shift: No intake/output data recorded.  PE: Gen:  Alert, NAD Pulm:  rate and effort normal Abd: Soft, ND, mild tenderness right abdomen, open midline somewhat dehisced but not eviscerated/ tissue mostly beefy red visible suture and fibrinous exudate at base/ no purulent drainage/ sinus tract tunneling lateral right wound connected to opening in skin seen below; ostomy with stool in pouch     Lab Results:  Recent Labs    05/01/20 0111 05/02/20 0233  WBC 8.9 9.4  HGB 9.6* 9.4*  HCT 31.6* 30.6*  PLT 439* 443*   BMET Recent Labs    04/30/20 0648 05/02/20 0233  NA  --  136  K  --  4.0  CL  --  100  CO2  --  27  GLUCOSE  --  124*  BUN  --  11  CREATININE 0.51 0.49  CALCIUM  --  8.6*   PT/INR No results for input(s): LABPROT, INR in the last 72 hours. CMP     Component Value Date/Time   NA 136 05/02/2020 0233   NA 139 07/12/2019 1110   K 4.0 05/02/2020 0233   CL 100 05/02/2020 0233   CO2 27 05/02/2020 0233   GLUCOSE 124 (H) 05/02/2020 0233   BUN 11 05/02/2020 0233   BUN 15 07/12/2019 1110   CREATININE 0.49 05/02/2020 0233   CREATININE 0.74 01/08/2017 1100   CALCIUM 8.6 (L) 05/02/2020 0233   PROT 5.9 (L) 05/02/2020 0233   PROT 7.1 02/11/2019 1455   ALBUMIN 2.7 (L) 05/02/2020 0233   ALBUMIN 4.4 02/11/2019 1455   AST 23 05/02/2020 0233   ALT 24  05/02/2020 0233   ALKPHOS 43 05/02/2020 0233   BILITOT 0.2 (L) 05/02/2020 0233   BILITOT <0.2 02/11/2019 1455   GFRNONAA >60 05/02/2020 0233   GFRNONAA 81 01/08/2017 1100   GFRAA >60 05/02/2020 0233   GFRAA >89 01/08/2017 1100   Lipase     Component Value Date/Time   LIPASE 19 04/16/2020 0042       Studies/Results: CT ABDOMEN PELVIS W CONTRAST  Result Date: 04/30/2020 CLINICAL DATA:  Intra-abdominal abscess.  Vomiting. EXAM: CT ABDOMEN AND PELVIS WITH CONTRAST TECHNIQUE: Multidetector CT imaging of the abdomen and pelvis was performed using the standard protocol following bolus administration of intravenous contrast. CONTRAST:  178mL OMNIPAQUE IOHEXOL 300 MG/ML  SOLN COMPARISON:  CT chest 04/22/2020 FINDINGS: Lower chest: Lung bases are clear. Hepatobiliary: No focal hepatic lesion. No biliary duct dilatation. Common bile duct is normal. Pancreas: Pancreas is normal. No ductal dilatation. No pancreatic inflammation. Spleen: Normal spleen Adrenals/urinary tract: Adrenal glands and kidneys are normal. The ureters and bladder normal. Stomach/Bowel: Small hiatal hernia. Stomach, duodenum small-bowel normal. Appendix not identified. Colon is predominantly collapsed. There is mild bowel wall thickening throughout the entire colon. LEFT abdominal wall colostomy. No obstruction. Rectal pouch noted without complication. Vascular/Lymphatic: Abdominal aorta is  normal caliber with atherosclerotic calcification. There is no retroperitoneal or periportal lymphadenopathy. No pelvic lymphadenopathy. Lymph node within the bowel mesentery anterior to the LEFT psoas muscle measures 7 mm on image 50/3). Reproductive: Uterus and adnexa unremarkable. Other: Midline ventral wound dehiscence. No organized fluid collections along the ventral abdominal wall. No peritoneal abscess or extension. Peritoneal nodule in the ventral RIGHT abdomen anterior to the ascending colon measures 8 mm (48/3). Musculoskeletal: No  aggressive osseous lesion IMPRESSION: 1. Midline ventral wound dehiscence. No organized fluid collections within the subcutaneous tissue, muscle or fascial layer. No intra-abdominal extension identified. 2. Post partial LEFT colectomy for sigmoid colon mass. Small residual lymph node in the LEFT abdominal mesentery. 3. Small peritoneal nodule in the RIGHT ventral peritoneal surface. Cannot exclude a peritoneal metastasis. 4. Mild diffuse colon wall thickening could indicate mild colitis. Electronically Signed   By: Suzy Bouchard M.D.   On: 04/30/2020 13:00    Anti-infectives: Anti-infectives (From admission, onward)   Start     Dose/Rate Route Frequency Ordered Stop   04/25/20 1115  meropenem (MERREM) 1 g in sodium chloride 0.9 % 100 mL IVPB     Discontinue     1 g 200 mL/hr over 30 Minutes Intravenous Every 8 hours 04/25/20 1106 05/02/20 2359   04/24/20 0915  doxycycline (VIBRA-TABS) tablet 100 mg  Status:  Discontinued        100 mg Oral Every 12 hours 04/24/20 0850 04/25/20 1106   04/20/20 1200  cefOXitin (MEFOXIN) 2 g in sodium chloride 0.9 % 100 mL IVPB        2 g 200 mL/hr over 30 Minutes Intravenous Every 8 hours 04/20/20 1130 04/20/20 1245   04/19/20 1800  cefOXitin (MEFOXIN) 2 g in sodium chloride 0.9 % 100 mL IVPB  Status:  Discontinued        2 g 200 mL/hr over 30 Minutes Intravenous Every 8 hours 04/19/20 1659 04/20/20 1130   04/17/20 0000  cefTRIAXone (ROCEPHIN) 1 g in sodium chloride 0.9 % 100 mL IVPB        1 g 200 mL/hr over 30 Minutes Intravenous  Once 04/16/20 1604 04/16/20 2347   04/16/20 0115  cefTRIAXone (ROCEPHIN) 1 g in sodium chloride 0.9 % 100 mL IVPB        1 g 200 mL/hr over 30 Minutes Intravenous  Once 04/16/20 0113 04/16/20 0435       Assessment/Plan HTN T2DM Hypothyroidism ABL anemia -Hgb 9.4, stable - Per [facility] -  Adenocarcinoma of the colon  POD #12 s/p sigmoid colectomy and end colostomy by Dr. Ninfa Linden on 7/30 POD #1 dressing change  abdominal wound, Incision and drainage of 4 cm abdominal wall abscess right abdominal wall 8/10 Dr. Donne Hazel -Path with invasive adenocarcinoma with negative resection margins, metastatic carcinoma of 1/15 lymph notes and 1 satellite nodule.Pre-opCEA mildly elevated at 14. Dr. Marin Olp following. CT chest without metastasis. Med Onc saw 8/3 and will follow -Midline wound opened with evacuation of hematoma8/2. BID WTD dressing changes. After surgery 8/10 there is a tunnel that needs to be packed as well in the right lateral aspect of the wound.Not a good candidate for vac with tunneling of wound - tolerating diet, ostomy funcitoning  FEN: soft diet VTE: SCDs, lovenox ID: [email or web address] x1. Doxy8/3- 8/4.Merrem8/4 >>8/11 Foley - Out Follow-Up - wound check, then Dr. Erick Blinks [facility] for discharge today from surgical standpoint, will discuss with primary. Unfortunately there are no home health options for this patient. I will  make her an appointment in our office next week for wound check, then have her see Dr. Ninfa Linden. I will send rx for lidoderm patches and robaxin to her pharmacy, otherwise she isn't using any pain medication.   LOS: 16 days    [facility] 05/02/2020, 10:45 AM Please see Amion for pager number during day hours 7:00am-4:30pm

## 2020-05-02 NOTE — Discharge Instructions (Signed)
[facility]      [facility] [phone]  OPEN ABDOMINAL SURGERY: POST OP INSTRUCTIONS  Always review your discharge instruction sheet given to you by the facility where your surgery was performed.  IF YOU HAVE DISABILITY OR FAMILY LEAVE FORMS, YOU MUST BRING THEM TO THE OFFICE FOR PROCESSING.  PLEASE DO NOT GIVE THEM TO YOUR DOCTOR.  1. A prescription for pain medication may be given to you upon discharge.  Take your pain medication as prescribed, if needed.  If narcotic pain medicine is not needed, then you may take acetaminophen (Tylenol) or ibuprofen (Advil) as needed. 2. Take your usually prescribed medications unless otherwise directed. 3. If you need a refill on your pain medication, please contact your pharmacy. They will contact our office to request authorization.  Prescriptions will not be filled after 5pm or on week-ends. 4. You should follow a light diet the first few days after arrival home, such as soup and crackers, pudding, etc.unless your doctor has advised otherwise. A high-fiber, low fat diet can be resumed as tolerated.   Be sure to include lots of fluids daily. Most patients will experience some swelling and bruising on the chest and neck area.  Ice packs will help.  Swelling and bruising can take several days to resolve 5. Most patients will experience some swelling and bruising in the area of the incision. Ice pack will help. Swelling and bruising can take several days to resolve..  6. It is common to experience some constipation if taking pain medication after surgery.  Increasing fluid intake and taking a stool softener will usually help or prevent this problem from occurring.  A mild laxative (Milk of Magnesia or Miralax) should be taken according to package directions if there are no bowel movements after 48 hours. 7.  You may have steri-strips (small skin tapes) in place directly over the incision.  These strips should be left on the skin for 7-10 days.  If your  surgeon used skin glue on the incision, you may shower in 24 hours.  The glue will flake off over the next 2-3 weeks.  Any sutures or staples will be removed at the office during your follow-up visit. You may find that a light gauze bandage over your incision may keep your staples from being rubbed or pulled. You may shower and replace the bandage daily. 8. ACTIVITIES:  You may resume regular (light) daily activities beginning the next day--such as daily self-care, walking, climbing stairs--gradually increasing activities as tolerated.  You may have sexual intercourse when it is comfortable.  Refrain from any heavy lifting or straining until approved by your doctor. a. You may drive when you no longer are taking prescription pain medication, you can comfortably wear a seatbelt, and you can safely maneuver your car and apply brakes b. Return to Work: ___________________________________ 9. You should see your doctor in the office for a follow-up appointment approximately two weeks after your surgery.  Make sure that you call for this appointment within a day or two after you arrive home to insure a convenient appointment time. OTHER INSTRUCTIONS:  _____________________________________________________________ _____________________________________________________________  WHEN TO CALL YOUR DOCTOR: 1. Fever over 101.0 2. Inability to urinate 3. Nausea and/or vomiting 4. Extreme swelling or bruising 5. Continued bleeding from incision. 6. Increased pain, redness, or drainage from the incision. 7. Difficulty swallowing or breathing 8. Muscle cramping or spasms. 9. Numbness or tingling in hands or feet or around lips.  The clinic staff is available to   answer your questions during regular business hours.  Please don't hesitate to call and ask to speak to one of the nurses if you have concerns.  For further questions, please visit [email or web address]  Wet to Dry WOUND CARE: - Change  dressing twice daily - Supplies: sterile saline, kerlex, scissors, ABD pads, tape  1. Remove dressing and all packing carefully, moistening with sterile saline as needed to avoid packing/internal dressing sticking to the wound. 2.   Clean edges of skin around the wound with water/gauze, making sure there is no tape debris or leakage left on skin that could cause skin irritation or breakdown. 3.   Dampen and clean kerlex with sterile saline and pack wound from wound base to skin level, making sure to take note of any possible areas of wound tracking, tunneling and packing appropriately. Wound can be packed loosely. Trim kerlex to size if a whole kerlex is not required. 4.   Cover wound with a dry ABD pad and secure with tape.  5.   Write the date/time on the dry dressing/tape to better track when the last dressing change occurred. - apply any skin protectant/powder if recommended by clinician to protect skin/skin folds. - change dressing as needed if leakage occurs, wound gets contaminated, or patient requests to shower. - You may shower daily with wound open and following the shower the wound should be dried and a clean dressing placed.  - Medical grade tape as well as packing supplies can be found at [employer] on [address]. The remaining supplies can be found at your local drug store, [employer] etc.  

## 2020-05-06 ENCOUNTER — Encounter (HOSPITAL_COMMUNITY): Payer: Self-pay | Admitting: Emergency Medicine

## 2020-05-06 ENCOUNTER — Other Ambulatory Visit: Payer: Self-pay

## 2020-05-06 ENCOUNTER — Emergency Department (HOSPITAL_COMMUNITY)
Admission: EM | Admit: 2020-05-06 | Discharge: 2020-05-06 | Disposition: A | Discharge status: Home / Self Care | Payer: Medicaid Other | Attending: Emergency Medicine | Admitting: Emergency Medicine

## 2020-05-06 DIAGNOSIS — Z4889 Encounter for other specified surgical aftercare: Secondary | ICD-10-CM | POA: Insufficient documentation

## 2020-05-06 DIAGNOSIS — Z79890 Hormone replacement therapy (postmenopausal): Secondary | ICD-10-CM | POA: Insufficient documentation

## 2020-05-06 DIAGNOSIS — Z7984 Long term (current) use of oral hypoglycemic drugs: Secondary | ICD-10-CM | POA: Diagnosis not present

## 2020-05-06 DIAGNOSIS — E119 Type 2 diabetes mellitus without complications: Secondary | ICD-10-CM | POA: Diagnosis not present

## 2020-05-06 DIAGNOSIS — E039 Hypothyroidism, unspecified: Secondary | ICD-10-CM | POA: Diagnosis not present

## 2020-05-06 DIAGNOSIS — Z87891 Personal history of nicotine dependence: Secondary | ICD-10-CM | POA: Insufficient documentation

## 2020-05-06 DIAGNOSIS — R109 Unspecified abdominal pain: Secondary | ICD-10-CM | POA: Insufficient documentation

## 2020-05-06 DIAGNOSIS — I10 Essential (primary) hypertension: Secondary | ICD-10-CM | POA: Diagnosis not present

## 2020-05-06 DIAGNOSIS — Z7982 Long term (current) use of aspirin: Secondary | ICD-10-CM | POA: Insufficient documentation

## 2020-05-06 DIAGNOSIS — L089 Local infection of the skin and subcutaneous tissue, unspecified: Secondary | ICD-10-CM | POA: Diagnosis present

## 2020-05-06 LAB — CBC WITH DIFFERENTIAL/PLATELET
Abs Immature Granulocytes: 0.04 10*3/uL (ref 0.00–0.07)
Basophils Absolute: 0.1 10*3/uL (ref 0.0–0.1)
Basophils Relative: 1 %
Eosinophils Absolute: 0.2 10*3/uL (ref 0.0–0.5)
Eosinophils Relative: 2 %
HCT: 37.7 % (ref 36.0–46.0)
Hemoglobin: 11.4 g/dL — ABNORMAL LOW (ref 12.0–15.0)
Immature Granulocytes: 1 %
Lymphocytes Relative: 21 %
Lymphs Abs: 1.9 10*3/uL (ref 0.7–4.0)
MCH: 27.7 pg (ref 26.0–34.0)
MCHC: 30.2 g/dL (ref 30.0–36.0)
MCV: 91.7 fL (ref 80.0–100.0)
Monocytes Absolute: 1 10*3/uL (ref 0.1–1.0)
Monocytes Relative: 11 %
Neutro Abs: 5.6 10*3/uL (ref 1.7–7.7)
Neutrophils Relative %: 64 %
Platelets: 506 10*3/uL — ABNORMAL HIGH (ref 150–400)
RBC: 4.11 MIL/uL (ref 3.87–5.11)
RDW: 14.9 % (ref 11.5–15.5)
WBC: 8.7 10*3/uL (ref 4.0–10.5)
nRBC: 0 % (ref 0.0–0.2)

## 2020-05-06 LAB — COMPREHENSIVE METABOLIC PANEL
ALT: 29 U/L (ref 0–44)
AST: 30 U/L (ref 15–41)
Albumin: 3.4 g/dL — ABNORMAL LOW (ref 3.5–5.0)
Alkaline Phosphatase: 51 U/L (ref 38–126)
Anion gap: 12 (ref 5–15)
BUN: 14 mg/dL (ref 8–23)
CO2: 23 mmol/L (ref 22–32)
Calcium: 9.1 mg/dL (ref 8.9–10.3)
Chloride: 102 mmol/L (ref 98–111)
Creatinine, Ser: 0.55 mg/dL (ref 0.44–1.00)
GFR calc Af Amer: >60 mL/min (ref >60)
GFR calc non Af Amer: >60 mL/min (ref >60)
Glucose, Bld: 137 mg/dL — ABNORMAL HIGH (ref 70–99)
Potassium: 3.9 mmol/L (ref 3.5–5.1)
Sodium: 137 mmol/L (ref 135–145)
Total Bilirubin: 0.3 mg/dL (ref 0.3–1.2)
Total Protein: 7.3 g/dL (ref 6.5–8.1)

## 2020-05-06 LAB — URINALYSIS, ROUTINE W REFLEX MICROSCOPIC
Bilirubin Urine: NEGATIVE
Glucose, UA: NEGATIVE mg/dL
Hgb urine dipstick: NEGATIVE
Ketones, ur: NEGATIVE mg/dL
Nitrite: NEGATIVE
Protein, ur: NEGATIVE mg/dL
Specific Gravity, Urine: 1.023 (ref 1.005–1.030)
pH: 5 (ref 5.0–8.0)

## 2020-05-06 LAB — LACTIC ACID, PLASMA: Lactic Acid, Venous: 1.2 mmol/L (ref 0.5–1.9)

## 2020-05-06 LAB — PROTIME-INR
INR: 1.1 (ref 0.8–1.2)
Prothrombin Time: 13.3 seconds (ref 11.4–15.2)

## 2020-05-06 NOTE — ED Notes (Signed)
Colostomy bag emptied.

## 2020-05-06 NOTE — ED Provider Notes (Signed)
[facility] EMERGENCY DEPARTMENT Provider Note   CSN: [id number] Arrival date & time: 05/06/20  1514     History Chief Complaint  Patient presents with  . Wound Infection    Bailey Walker is a 74 y.o. 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  Niece does most of the translating.  She reports that patient was recently discharged from the hospital with an open wound to her abdomen.  She states that there were plans for home health to come out and do the dressing changes however there have been issues with patient's insurance and she was told that she would have to pay out-of-pocket for a home health nurse.  Niece has been doing the dressing changes herself and states that throughout the past couple of days since being discharged the wound has been producing more more purulent drainage.  She states that the wound bled from the left side this morning causing concern.  She had a friend come over who is a nurse who told her that it is infected and she needs to come back to the ED for further evaluation.  Niece states that they were not discharged with any antibiotics which confused her.  She states that patient has been dealing with pain however denies any pain out of the ordinary.  She has been giving Tylenol and muscle relaxer as prescribed.  She states that patient has not been vomiting at home and eating small amounts.   Per chart review:  Pt admitted to the hospital from 07/25-08/11 after presenting for severe generalized abdominal pain and vomiting since July 4.  CT scan concerning for obstructing colorectal carcinoma with nodal metastatic disease.  **Interim History (Discharge Summary 08/11) She underwent a sigmoidoscopy on 04/19/2020 and pathology confirmed adenocarcinoma with mucinous features.  Biopsies from upper endoscopy with non-H. pylori gastritis and a benign hyperplastic polyp  was noted.  General surgery was consulted and she underwent a sigmoid colectomy and end colostomy and medical oncology was consulted for further evaluation recommendation.  Today she is postoperative day 12 of sigmoid colectomy with end colostomy done by Dr. Chyrel Masson on 04/20/2020 and she underwent a repeat dressing change with abdominal wound incision and drainage of 4 cm abdominal wall abscess in the right abdominal wall by Dr. Donne Hazel on 05/01/2020.  CT of the chest was done during his hospitalization showed no metastasis.  Medical oncology will follow up in outpatient setting.  She has a midline wound that is open and evacuation of the hematoma on a 10/2019.  General surgery recommending twice daily wet-to-dry dressing changes.  She is not a good candidate for VAC given the tunneling of her wound.  General surgery recommending discharging today as she is completed antibiotics and given doxycycline as well as meropenem during his hospitalization.  She will follow-up with general surgery in outpatient setting and she is stable to be discharged home at this time and TOC work to try to get her home health and they are able to arrange for an Therapist, sports.  Niece was taught how to do dressing changes in the interim.   The history is provided by the patient, a relative and medical records. The history is limited by a language barrier.       Past Medical History:  Diagnosis Date  . 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 Diabetes mellitus without complication ([place])   .  Goals of care, counseling/discussion 04/26/2020  . Hypertension   . SVT (supraventricular tachycardia) ([place])   . Thyroid disease     Patient Active Problem List   Diagnosis Date Noted  . 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 Goals of care, counseling/discussion 04/26/2020  . Gastritis and gastroduodenitis   . Grade II internal hemorrhoids   . Abdominal  distension   . Nausea and vomiting in adult   . Abnormal CT scan, gastrointestinal tract   . Colon obstruction ([place]) 04/16/2020  . UTI (urinary tract infection) 04/16/2020  . Dyspnea on exertion 01/27/2020  . Iron deficiency anemia 01/26/2020  . Vitamin B12 deficiency anemia 01/26/2020  . 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 Fuchs' corneal dystrophy 09/20/2018  . 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 Diabetes mellitus due to underlying condition with unspecified complications ([place]) 08/67/6195  . Ex-smoker 08/26/2017  . Atypical chest pain 08/26/2017  . Essential hypertension 01/11/2017  . Hypothyroidism 01/08/2017  . SVT (supraventricular tachycardia) ([place]) 01/08/2017    Past Surgical History:  Procedure Laterality Date  . APPENDECTOMY    . BIOPSY  04/18/2020   Procedure: BIOPSY;  Surgeon: Lavena Bullion, DO;  Location: [facility];  Service: Gastroenterology;;  . BIOPSY  04/19/2020   Procedure: BIOPSY;  Surgeon: Lavena Bullion, DO;  Location: [facility];  Service: Gastroenterology;;  . BREAST SURGERY     Fatty tissue on biopsy  . ESOPHAGOGASTRODUODENOSCOPY (EGD) WITH PROPOFOL N/A 04/18/2020   Procedure: ESOPHAGOGASTRODUODENOSCOPY (EGD) WITH PROPOFOL;  Surgeon: Lavena Bullion, DO;  Location: [facility];  Service: Gastroenterology;  Laterality: N/A;  . EYE SURGERY Bilateral    april and march 2019 for cataracts.   . fatty gland     left wrist , right breast  . FLEXIBLE SIGMOIDOSCOPY N/A 04/18/2020   Procedure: FLEXIBLE SIGMOIDOSCOPY;  Surgeon: Lavena Bullion, DO;  Location: [facility];  Service: Gastroenterology;  Laterality: N/A;  . FLEXIBLE SIGMOIDOSCOPY N/A 04/19/2020   Procedure: FLEXIBLE SIGMOIDOSCOPY;  Surgeon: Lavena Bullion, DO;  Location: [facility];  Service: Gastroenterology;  Laterality: N/A;  . INCISION AND DRAINAGE OF WOUND N/A  05/01/2020   Procedure: ABDOMINAL  WOUND EXPLORATION; IRRIGATION AND DEBRIDEMENT WOUND;  Surgeon: Rolm Bookbinder, MD;  Location: [facility];  Service: General;  Laterality: N/A;  . KNEE SURGERY     left knee  . LAPAROTOMY N/A 04/20/2020   Procedure: SIGMOID COLECTOMY AND COLOSTOMY;  Surgeon: Coralie Keens, MD;  Location: [facility];  Service: General;  Laterality: N/A;  . SUBMUCOSAL TATTOO INJECTION  04/19/2020   Procedure: SUBMUCOSAL TATTOO INJECTION;  Surgeon: Lavena Bullion, DO;  Location: [facility];  Service: Gastroenterology;;     OB History   No obstetric history on file.     Family History  Problem Relation Age of Onset  . Breast cancer Cousin        mat and pat sides  . Cancer Sister        cancer everywhere  . Colon cancer Neg Hx   . Esophageal cancer Neg Hx   . Rectal cancer Neg Hx     Social History   Tobacco Use  . Smoking status: Former Smoker    Quit date: 12/29/2013    Years since quitting: 6.3  . Smokeless tobacco: Never Used  Vaping Use  . Vaping Use: Never used  Substance Use Topics  .  Alcohol use: No  . Drug use: No    Home Medications Prior to Admission medications   Medication Sig Start Date End Date Taking? Authorizing Provider  alendronate (FOSAMAX) 70 MG tablet Take 70 mg by mouth every Sunday.  03/01/20   [provider]  aspirin EC 81 MG tablet Take 81 mg by mouth daily. Swallow whole.    [provider]  denosumab (PROLIA) 60 MG/ML SOSY injection Inject 60 mg into the skin every 6 (six) months.    [provider]  DEXILANT 30 MG capsule TAKE 1 CAPSULE BY MOUTH EVERY DAY Patient taking differently: Take 30 mg by mouth daily.  10/20/19   Tresa Garter, MD  Dextromethorphan-guaiFENesin 10-100 MG/5ML liquid Take 5 mLs by mouth every 12 (twelve) hours as needed (cough).     [provider]  docusate sodium (COLACE) 100 MG capsule Take 1 capsule (100 mg total) by mouth 2 (two) times daily. 05/02/20   Raiford Noble Latif, DO  feeding supplement, ENSURE ENLIVE, (ENSURE ENLIVE) LIQD Take 237 mLs by mouth 3 (three) times daily between meals. 05/02/20   Sheikh, Omair Latif, DO  glipiZIDE (GLUCOTROL) 5 MG tablet TAKE 1 TABLET BY MOUTH TWICE A DAY BEFORE MEALS Patient taking differently: Take 5 mg by mouth See admin instructions. Take one tablet (5 mg) by mouth twice daily - after lunch and at midnight 11/04/19   Dorena Dew, FNP  levothyroxine (SYNTHROID) 75 MCG tablet TAKE 1 TABLET BY MOUTH EVERY DAY Patient taking differently: Take 75 mcg by mouth daily before breakfast.  01/02/20   Dorena Dew, FNP  lidocaine (LIDODERM) 5 % Place 1 patch onto the skin daily. Remove & Discard patch within 12 hours or as directed by MD 05/02/20   Ileene Rubens, Blaine Hamper, PA-C  metFORMIN (GLUCOPHAGE) 500 MG tablet Take 0.5 tablets (250 mg total) by mouth 2 (two) times daily with a meal. Patient taking differently: Take 500 mg by mouth See admin instructions. Take one tablet (500 mg) by mouth twice daily - after lunch and at midnight 05/16/19   [name], FNP  methocarbamol (ROBAXIN) 500 MG tablet Take 1 tablet (500 mg total) by mouth every 8 (eight) hours as needed for muscle spasms. 05/02/20   Meuth, Brooke A, PA-C  metoprolol tartrate (LOPRESSOR) 25 MG tablet Take 1 tablet (25 mg total) by mouth 2 (two) times daily. 08/02/19   Tresa Garter, MD  Multiple Vitamin (MULTIVITAMIN WITH MINERALS) TABS tablet Take 1 tablet by mouth daily. 05/02/20   Raiford Noble Latif, DO  nitroGLYCERIN (NITROSTAT) 0.4 MG SL tablet Place 1 tablet (0.4 mg total) under the tongue every 5 (five) minutes as needed for chest pain. 05/16/19 04/16/20  [name], FNP  ondansetron (ZOFRAN) 4 MG tablet Take 1 tablet (4 mg total) by mouth every 6 (six) hours as needed for nausea. 05/02/20   Raiford Noble Latif, DO  prednisoLONE acetate (PRED FORTE) 1 % ophthalmic suspension Place 1 drop into both eyes See admin instructions. Instill one drop into  both eyes on Monday, Wednesday, Friday nights    [provider]    Allergies    Nitroglycerin and Ampicillin  Review of Systems   Review of Systems  Constitutional: Negative for chills and fever.  Gastrointestinal: Positive for abdominal pain. Negative for vomiting.  Skin: Positive for wound.  All other systems reviewed and are negative.   Physical Exam Updated Vital Signs BP 129/70 (BP Location: Right Arm)   Pulse 91  Temp 98.1 F (36.7 C) (Oral)   Resp 16   Ht 5\' 1"  (1.549 m)   Wt 72.6 kg   SpO2 100%   BMI 30.23 kg/m   Physical Exam Vitals and nursing note reviewed.  Constitutional:      Appearance: She is not ill-appearing or diaphoretic.  HENT:     Head: Normocephalic and atraumatic.  Eyes:     Conjunctiva/sclera: Conjunctivae normal.  Cardiovascular:     Rate and Rhythm: Normal rate and regular rhythm.  Pulmonary:     Effort: Pulmonary effort is normal.     Breath sounds: Normal breath sounds. No wheezing, rhonchi or rales.  Abdominal:     Palpations: Abdomen is soft.     Tenderness: There is no abdominal tenderness.     Comments: See photo below.  Musculoskeletal:     Cervical back: Neck supple.  Skin:    General: Skin is warm and dry.  Neurological:     Mental Status: She is alert.        ED Results / Procedures / Treatments   Labs (all labs ordered are listed, but only abnormal results are displayed) Labs Reviewed  COMPREHENSIVE METABOLIC PANEL - Abnormal; Notable for the following components:      Result Value   Glucose, Bld 137 (*)    Albumin 3.4 (*)    All other components within normal limits  CBC WITH DIFFERENTIAL/PLATELET - Abnormal; Notable for the following components:   Hemoglobin 11.4 (*)    Platelets 506 (*)    All other components within normal limits  URINALYSIS, ROUTINE W REFLEX MICROSCOPIC - Abnormal; Notable for the following components:   Color, Urine AMBER (*)    APPearance CLOUDY (*)    Leukocytes,Ua SMALL  (*)    Bacteria, UA RARE (*)    All other components within normal limits  CULTURE, BLOOD (ROUTINE X 2)  CULTURE, BLOOD (ROUTINE X 2)  URINE CULTURE  LACTIC ACID, PLASMA  PROTIME-INR    EKG None  Radiology No results found.  Procedures Procedures (including critical care time)  Medications Ordered in ED Medications - No data to display  ED Course  I have reviewed the triage vital signs and the nursing notes.  Pertinent labs & imaging results that were available during my care of the patient were reviewed by me and considered in my medical decision making (see chart for details).    MDM Rules/Calculators/A&P                         74 year old female who presents to the ED today with concern for wound infection. She had colon resection done while in the hospital on 07/30 with findings of a hematoma and then abscess of the wound itself requiring incision and drainage several days later. She was not a candidate for wound VAC and so she was discharged home with instructions for wet-to-dry dressings. Niece is taking care of patient at home and is concerned that the wound looks infected prompting her to come to the ED. On arrival to the ED patient is afebrile, nontachycardic nontachypneic. She appears to be no acute distress. She has been eating and drinking normally at home without any signs of vomiting. She has good output from her ostomy bag. Patient is also concerned as she has been taking care of the wound herself at home. There seems to be some discrepancy with home health being ordered and patient's insurance covering it. Unfortunately social  work is not here at this time. Will try to reorder home health.  Lab work was obtained while patient was in the waiting room. CBC without leukocytosis. Hemoglobin stable at 11.4. CMP with glucose 137. No other electrolyte abnormalities. Lactic acid 1.2.  I discussed case with Dr. Georgette Dover with general surgery who has evaluated the photo from  today as well as previous photos on 08/11 at time of discharge. He reports that the wound does appear improved from previous and pt should keep appointment for follow up in 2 days time.   Urinalysis with small leuks, 21-50 white blood cells per high-power field and rare bacteria. Patient is denying any urinary symptoms currently. It does appear that she had a questionable UTI while in the hospital and received Rocephin. Urine culture showed multiple organisms and they recommended repeat collection. With repeat collection there were no signs of infection on UA and a repeat urine culture without any growth. I will send for culture at this time however I do not feel patient needs treatment currently given no symptoms.   Discussed plan with niece and patient. She is in agreement to follow-up in 2 days for wound recheck with [facility]. She is in understanding that urine culture has been sent and we will contact them in 2 days time if it grows any bacteria. Wound has been redressed prior to discharge home.   This note was prepared using Dragon voice recognition software and may include unintentional dictation errors due to the inherent limitations of voice recognition software.   Final Clinical Impression(s) / ED Diagnoses Final diagnoses:  Encounter for post surgical wound check    Rx / DC Orders ED Discharge Orders         [place]  Reprint     05/06/20 2048    Face-to-face encounter (required for Medicare/Medicaid patients)     Discontinue  Reprint    Comments: I Eustaquio Maize certify that this patient is under my care and that I, or a nurse practitioner or physician's assistant working with me, had a face-to-face encounter that meets the physician face-to-face encounter requirements with this patient on 05/06/2020. The encounter with the patient was in whole, or in part for the following medical condition(s) which is the primary reason for home health  care (List medical condition): open wound to abdomen   05/06/20 2048           Discharge Instructions     Keep appointment in 2 days time with [facility]. Continue changing wound twice daily as you have been doing.  We have sent the urine for culture and will call in 2-3 days time if it grows any bacteria Return to the ED IMMEDIATELY for any worsening symptoms including fevers > 100.4, severe abdominal pain, vomiting, or any other new/concerning symptoms       Eustaquio Maize, PA-C 05/06/20 2049    Fredia Sorrow, MD 05/14/20 [phone]

## 2020-05-06 NOTE — ED Triage Notes (Signed)
Pt BIB family, pt had a colostomy placed and abdominal surgery recently and was discharged home. Caregiver states pt had an infection while in the hospital and they had to open her abdominal wound, and also reports she was not placed on abx. Caregiver reports increased green and yellow drainage.

## 2020-05-06 NOTE — ED Notes (Signed)
Spoke with caregiver that is concerned regarding pt having to wait for treatment room and she overheard someone saying there was a 4 1/2 hour wait.  .  Explained that pt is a higher acuity and we are working hard to get her back.  Explained delays and encouraged her to wait to be seen.

## 2020-05-06 NOTE — Discharge Instructions (Addendum)
Keep appointment in 2 days time with [facility]. Continue changing wound twice daily as you have been doing.  We have sent the urine for culture and will call in 2-3 days time if it grows any bacteria Return to the ED IMMEDIATELY for any worsening symptoms including fevers > 100.4, severe abdominal pain, vomiting, or any other new/concerning symptoms

## 2020-05-08 ENCOUNTER — Inpatient Hospital Stay: Payer: Medicaid Other | Attending: Hematology & Oncology

## 2020-05-08 ENCOUNTER — Telehealth: Payer: Self-pay | Admitting: Hematology & Oncology

## 2020-05-08 ENCOUNTER — Inpatient Hospital Stay (HOSPITAL_BASED_OUTPATIENT_CLINIC_OR_DEPARTMENT_OTHER): Payer: Medicaid Other | Admitting: Hematology & Oncology

## 2020-05-08 ENCOUNTER — Encounter: Payer: Self-pay | Admitting: Hematology & Oncology

## 2020-05-08 ENCOUNTER — Other Ambulatory Visit: Payer: Self-pay

## 2020-05-08 ENCOUNTER — Ambulatory Visit: Payer: Medicaid Other | Admitting: Oncology

## 2020-05-08 VITALS — BP 123/73 | HR 119 | Temp 98.7°F | Resp 18 | Wt 146.0 lb

## 2020-05-08 DIAGNOSIS — C772 Secondary and unspecified malignant neoplasm of intra-abdominal lymph nodes: Secondary | ICD-10-CM

## 2020-05-08 DIAGNOSIS — D510 Vitamin B12 deficiency anemia due to intrinsic factor deficiency: Secondary | ICD-10-CM | POA: Diagnosis not present

## 2020-05-08 DIAGNOSIS — Z79899 Other long term (current) drug therapy: Secondary | ICD-10-CM | POA: Diagnosis not present

## 2020-05-08 DIAGNOSIS — C187 Malignant neoplasm of sigmoid colon: Secondary | ICD-10-CM | POA: Diagnosis not present

## 2020-05-08 DIAGNOSIS — Z7982 Long term (current) use of aspirin: Secondary | ICD-10-CM | POA: Insufficient documentation

## 2020-05-08 DIAGNOSIS — Z7984 Long term (current) use of oral hypoglycemic drugs: Secondary | ICD-10-CM | POA: Insufficient documentation

## 2020-05-08 DIAGNOSIS — Z933 Colostomy status: Secondary | ICD-10-CM | POA: Diagnosis not present

## 2020-05-08 LAB — CMP (CANCER CENTER ONLY)
ALT: 21 U/L (ref 0–44)
AST: 18 U/L (ref 15–41)
Albumin: 4.1 g/dL (ref 3.5–5.0)
Alkaline Phosphatase: 49 U/L (ref 38–126)
Anion gap: 9 (ref 5–15)
BUN: 17 mg/dL (ref 8–23)
CO2: 29 mmol/L (ref 22–32)
Calcium: 9.9 mg/dL (ref 8.9–10.3)
Chloride: 102 mmol/L (ref 98–111)
Creatinine: 0.67 mg/dL (ref 0.44–1.00)
GFR, Est AFR Am: >60 mL/min (ref >60)
GFR, Estimated: >60 mL/min (ref >60)
Glucose, Bld: 146 mg/dL — ABNORMAL HIGH (ref 70–99)
Potassium: 4.3 mmol/L (ref 3.5–5.1)
Sodium: 140 mmol/L (ref 135–145)
Total Bilirubin: 0.3 mg/dL (ref 0.3–1.2)
Total Protein: 7.3 g/dL (ref 6.5–8.1)

## 2020-05-08 LAB — CBC WITH DIFFERENTIAL (CANCER CENTER ONLY)
Abs Immature Granulocytes: 0.02 10*3/uL (ref 0.00–0.07)
Basophils Absolute: 0.1 10*3/uL (ref 0.0–0.1)
Basophils Relative: 1 %
Eosinophils Absolute: 0.1 10*3/uL (ref 0.0–0.5)
Eosinophils Relative: 2 %
HCT: 37 % (ref 36.0–46.0)
Hemoglobin: 11.6 g/dL — ABNORMAL LOW (ref 12.0–15.0)
Immature Granulocytes: 0 %
Lymphocytes Relative: 31 %
Lymphs Abs: 1.9 10*3/uL (ref 0.7–4.0)
MCH: 28.5 pg (ref 26.0–34.0)
MCHC: 31.4 g/dL (ref 30.0–36.0)
MCV: 90.9 fL (ref 80.0–100.0)
Monocytes Absolute: 0.6 10*3/uL (ref 0.1–1.0)
Monocytes Relative: 10 %
Neutro Abs: 3.4 10*3/uL (ref 1.7–7.7)
Neutrophils Relative %: 56 %
Platelet Count: 422 10*3/uL — ABNORMAL HIGH (ref 150–400)
RBC: 4.07 MIL/uL (ref 3.87–5.11)
RDW: 14.4 % (ref 11.5–15.5)
WBC Count: 6.2 10*3/uL (ref 4.0–10.5)
nRBC: 0 % (ref 0.0–0.2)

## 2020-05-08 NOTE — Progress Notes (Signed)
Hematology and Oncology Follow Up Visit  Bailey Walker [id number] 04-21-1946 74 y.o. 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  We saw her at [facility] back in July.  At that time, she had presented with bowel obstruction.  She had a lower endoscopy.  The colonoscopy showed a obstructing mass in the sigmoid colon.  She underwent surgical removal.  This was on 04/20/2020.  She had a moderately differentiated adenocarcinoma that was producing mucin.  Margins were all negative.  She had 1/16 lymph nodes positive.  Unfortunately, postop, she had a wound infection.  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  Of the wound infection has really taken a long time to heal.  She was on antibiotics.  She was discharged.  She went to emergency room couple days ago.  There is still a significant opening of the wound.  She sees a Psychologist, sport and exercise today.  She would be a candidate for adjuvant chemotherapy.  Unfortunately, we really cannot give her treatment as long as this wound is not healing up.  She comes in with her daughter.  This is actually a niece who was raised by Bailey Walker.  Bailey Walker does not speak [place].  She does have some abdominal discomfort.  There is no obvious bleeding.  She has lost weight.  According to her niece, she probably is lost close to 25 pounds.  She is eating okay.  The colostomy is working well.  I would have to say that overall, her performance status is ECOG 1.  Medications:  Current Outpatient Medications:  .  alendronate (FOSAMAX) 70 MG tablet, Take 70 mg by mouth every Sunday. , Disp: , Rfl:  .  aspirin EC 81 MG tablet, Take 81 mg by mouth daily. Swallow whole., Disp: , Rfl:  .  denosumab (PROLIA) 60 MG/ML SOSY  injection, Inject 60 mg into the skin every 6 (six) months., Disp: , Rfl:  .  DEXILANT 30 MG capsule, TAKE 1 CAPSULE BY MOUTH EVERY DAY (Patient taking differently: Take 30 mg by mouth daily. ), Disp: 30 capsule, Rfl: 5 .  Dextromethorphan-guaiFENesin 10-100 MG/5ML liquid, Take 5 mLs by mouth every 12 (twelve) hours as needed (cough). , Disp: , Rfl:  .  docusate sodium (COLACE) 100 MG capsule, Take 1 capsule (100 mg total) by mouth 2 (two) times daily., Disp: 10 capsule, Rfl: 0 .  feeding supplement, ENSURE ENLIVE, (ENSURE ENLIVE) LIQD, Take 237 mLs by mouth 3 (three) times daily between meals., Disp: 237 mL, Rfl: 12 .  glipiZIDE (GLUCOTROL) 5 MG tablet, TAKE 1 TABLET BY MOUTH TWICE A DAY BEFORE MEALS (Patient taking differently: Take 5 mg by mouth See admin instructions. Take one tablet (5 mg) by mouth twice daily - after lunch and at midnight), Disp: 60 tablet, Rfl: 3 .  levothyroxine (SYNTHROID) 75 MCG tablet, TAKE 1 TABLET BY MOUTH EVERY DAY (Patient taking differently: Take 75 mcg by mouth daily before breakfast. ), Disp: 30 tablet, Rfl: 0 .  lidocaine (LIDODERM) 5 %, Place 1 patch onto the skin daily. Remove & Discard patch within 12 hours or as directed by MD, Disp: 15 patch, Rfl: 0 .  metFORMIN (GLUCOPHAGE) 500 MG tablet, Take 0.5 tablets (250 mg  total) by mouth 2 (two) times daily with a meal. (Patient taking differently: Take 500 mg by mouth See admin instructions. Take one tablet (500 mg) by mouth twice daily - after lunch and at midnight), Disp: 90 tablet, Rfl: 3 .  methocarbamol (ROBAXIN) 500 MG tablet, Take 1 tablet (500 mg total) by mouth every 8 (eight) hours as needed for muscle spasms., Disp: 30 tablet, Rfl: 0 .  metoprolol tartrate (LOPRESSOR) 25 MG tablet, Take 1 tablet (25 mg total) by mouth 2 (two) times daily., Disp: 180 tablet, Rfl: 3 .  Multiple Vitamin (MULTIVITAMIN WITH MINERALS) TABS tablet, Take 1 tablet by mouth daily., Disp: 30 tablet, Rfl: 0 .  nitroGLYCERIN (NITROSTAT)  0.4 MG SL tablet, Place 1 tablet (0.4 mg total) under the tongue every 5 (five) minutes as needed for chest pain., Disp: 11 tablet, Rfl: 6 .  ondansetron (ZOFRAN) 4 MG tablet, Take 1 tablet (4 mg total) by mouth every 6 (six) hours as needed for nausea., Disp: 20 tablet, Rfl: 0 .  prednisoLONE acetate (PRED FORTE) 1 % ophthalmic suspension, Place 1 drop into both eyes See admin instructions. Instill one drop into both eyes on Monday, Wednesday, Friday nights, Disp: , Rfl:   Allergies:  Allergies  Allergen Reactions  . Nitroglycerin Nausea And Vomiting  . Ampicillin Other (See Comments)    Per allergy test    Past Medical History, Surgical history, Social history, and Family History were reviewed and updated.  Review of Systems: Review of Systems  Constitutional: Positive for appetite change and unexpected weight change.  Eyes: Negative.   Respiratory: Negative.   Cardiovascular: Negative.   Gastrointestinal: Positive for abdominal pain and nausea.  Endocrine: Negative.   Genitourinary: Negative.    Musculoskeletal: Negative.   Skin: Negative.   Neurological: Negative.   Hematological: Negative.   Psychiatric/Behavioral: Negative.     Physical Exam:  weight is 146 lb (66.2 kg). Her oral temperature is 98.7 F (37.1 C). Her blood pressure is 123/73 and her pulse is 119 (abnormal). Her respiration is 18 and oxygen saturation is 100%.   Wt Readings from Last 3 Encounters:  05/08/20 146 lb (66.2 kg)  05/06/20 160 lb (72.6 kg)  05/01/20 162 lb 11.2 oz (73.8 kg)    Physical Exam Vitals reviewed.  HENT:     Head: Normocephalic and atraumatic.  Eyes:     Pupils: Pupils are equal, round, and reactive to light.  Cardiovascular:     Rate and Rhythm: Normal rate and regular rhythm.     Heart sounds: Normal heart sounds.  Pulmonary:     Effort: Pulmonary effort is normal.     Breath sounds: Normal breath sounds.  Abdominal:     General: Bowel sounds are normal.     Palpations:  Abdomen is soft.     Comments: Abdominal exam shows a soft abdomen.  She has the wound in the anterior abdominal wall.  There was a picture taken in the emergency room.  This was quite significant.  It was none healing over yet.  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  There is some slight tenderness to palpation of the abdomen.  There is no palpable liver or spleen tip.  Musculoskeletal:        General: No tenderness or deformity. Normal range of motion.     Cervical back: Normal range of motion.  Lymphadenopathy:     Cervical: No cervical adenopathy.  Skin:    General: Skin is warm and  dry.     Findings: No erythema or rash.  Neurological:     Mental Status: She is alert and oriented to person, place, and time.  Psychiatric:        Behavior: Behavior normal.        Thought Content: Thought content normal.        Judgment: Judgment normal.      Lab Results  Component Value Date   WBC 6.2 05/08/2020   HGB 11.6 (L) 05/08/2020   HCT 37.0 05/08/2020   MCV 90.9 05/08/2020   PLT 422 (H) 05/08/2020     Chemistry      Component Value Date/Time   NA 140 05/08/2020 1104   NA 139 07/12/2019 1110   K 4.3 05/08/2020 1104   CL 102 05/08/2020 1104   CO2 29 05/08/2020 1104   BUN 17 05/08/2020 1104   BUN 15 07/12/2019 1110   CREATININE 0.67 05/08/2020 1104   CREATININE 0.74 01/08/2017 1100      Component Value Date/Time   CALCIUM 9.9 05/08/2020 1104   ALKPHOS 49 05/08/2020 1104   AST 18 05/08/2020 1104   ALT 21 05/08/2020 1104   BILITOT 0.3 05/08/2020 1104      Impression and Plan: Bailey Walker is a very charming 74 year old woman.  She is [place].  She actually was living in [place].  [place].  She is a Astronomer.  She is so nice.  I just feel bad that she has had this issue with the healing wound.  We just are not able to treat her as of yet.  We will have to wait for this wound to heal up a whole lot more before we even think about  treatment.    She woul be a candidate for adjuvant chemotherapy.  I would consider FOLFOX for her.  I think she probably would do well with 8 cycles of treatment.  Again, I do not think that we probably could treat her for another couple months.  This concern is not ideal but yet I think we are "stuck" with having to wait for this wound to heal up a whole lot more.  Thankfully, only one lymph node was positive.  I think we will have to repeat her CT scans at some point.  When she had the scans done initially, there was a nodule in her right lung.  I am not sure what this would be.  We spent about 45 minutes with her today.  Again her niece was incredibly wonderful and very accurate with her translations.  I gave Bailey Walker a prayer blanket.  She is [place].  She has been to [place].  She talked about this.  It was very inspiring.     Volanda Napoleon, MD 8/17/202111:58 AM

## 2020-05-08 NOTE — Telephone Encounter (Signed)
Called and notified daughter of next appointments that have been scheduled per 8/17 los

## 2020-05-09 LAB — IRON AND TIBC
Iron: 34 ug/dL — ABNORMAL LOW (ref 41–142)
Saturation Ratios: 12 % — ABNORMAL LOW (ref 21–57)
TIBC: 289 ug/dL (ref 236–444)
UIBC: 255 ug/dL (ref 120–384)

## 2020-05-09 LAB — CEA (IN HOUSE-CHCC): CEA (CHCC-In House): 3.47 ng/mL (ref 0.00–5.00)

## 2020-05-09 LAB — FERRITIN: Ferritin: 149 ng/mL (ref 11–307)

## 2020-05-10 LAB — URINE CULTURE: Culture: >=100000 — AB

## 2020-05-11 ENCOUNTER — Telehealth: Payer: Self-pay | Admitting: *Deleted

## 2020-05-11 LAB — CULTURE, BLOOD (ROUTINE X 2)
Culture: NO GROWTH
Culture: NO GROWTH
Special Requests: ADEQUATE
Special Requests: ADEQUATE

## 2020-05-11 NOTE — Telephone Encounter (Signed)
Post ED Visit - Positive Culture Follow-up  Culture report reviewed by antimicrobial stewardship pharmacist: [facility] Team []  Elenor Quinones, Pharm.D. []  Heide Guile, Pharm.D., BCPS AQ-ID []  Parks Neptune, Pharm.D., BCPS []  Alycia Rossetti, Pharm.D., BCPS []  [place], [place].D., BCPS, AAHIVP []  Legrand Como, Pharm.D., BCPS, AAHIVP []  Salome Arnt, PharmD, BCPS []  Johnnette Gourd, PharmD, BCPS []  Hughes Better, PharmD, BCPS []  Leeroy Cha, PharmD []  Laqueta Linden, PharmD, BCPS []  Albertina Parr, PharmD  [place] Team []  Leodis Sias, PharmD []  Lindell Spar, PharmD []  Royetta Asal, PharmD []  Graylin Shiver, Rph []  Rema Fendt) Glennon Mac, PharmD []  Arlyn Dunning, PharmD []  Netta Cedars, PharmD []  Dia Sitter, PharmD []  Leone Haven, PharmD []  Gretta Arab, PharmD []  Theodis Shove, PharmD []  Peggyann Juba, PharmD []  Reuel Boom, PharmD   Positive urine culture, reviewed by Isla Pence MD and  no further patient follow-up is required at this time.  Harlon Flor [facility] 05/11/2020, 9:33 AM

## 2020-05-29 ENCOUNTER — Inpatient Hospital Stay (HOSPITAL_BASED_OUTPATIENT_CLINIC_OR_DEPARTMENT_OTHER): Payer: Medicaid Other | Admitting: Hematology & Oncology

## 2020-05-29 ENCOUNTER — Inpatient Hospital Stay: Payer: Medicaid Other | Attending: Hematology and Oncology

## 2020-05-29 ENCOUNTER — Encounter: Payer: Self-pay | Admitting: Hematology & Oncology

## 2020-05-29 ENCOUNTER — Other Ambulatory Visit: Payer: Self-pay

## 2020-05-29 VITALS — BP 112/58 | HR 93 | Temp 98.2°F | Resp 20 | Wt 150.0 lb

## 2020-05-29 DIAGNOSIS — D510 Vitamin B12 deficiency anemia due to intrinsic factor deficiency: Secondary | ICD-10-CM | POA: Diagnosis present

## 2020-05-29 DIAGNOSIS — C187 Malignant neoplasm of sigmoid colon: Secondary | ICD-10-CM | POA: Diagnosis present

## 2020-05-29 DIAGNOSIS — Z5111 Encounter for antineoplastic chemotherapy: Secondary | ICD-10-CM | POA: Insufficient documentation

## 2020-05-29 DIAGNOSIS — Z7984 Long term (current) use of oral hypoglycemic drugs: Secondary | ICD-10-CM | POA: Insufficient documentation

## 2020-05-29 DIAGNOSIS — C772 Secondary and unspecified malignant neoplasm of intra-abdominal lymph nodes: Secondary | ICD-10-CM | POA: Diagnosis not present

## 2020-05-29 DIAGNOSIS — Z7952 Long term (current) use of systemic steroids: Secondary | ICD-10-CM | POA: Diagnosis not present

## 2020-05-29 DIAGNOSIS — Z7982 Long term (current) use of aspirin: Secondary | ICD-10-CM | POA: Insufficient documentation

## 2020-05-29 DIAGNOSIS — Z79899 Other long term (current) drug therapy: Secondary | ICD-10-CM | POA: Diagnosis not present

## 2020-05-29 LAB — CMP (CANCER CENTER ONLY)
ALT: 10 U/L (ref 0–44)
AST: 17 U/L (ref 15–41)
Albumin: 4 g/dL (ref 3.5–5.0)
Alkaline Phosphatase: 45 U/L (ref 38–126)
Anion gap: 12 (ref 5–15)
BUN: 15 mg/dL (ref 8–23)
CO2: 26 mmol/L (ref 22–32)
Calcium: 10.3 mg/dL (ref 8.9–10.3)
Chloride: 101 mmol/L (ref 98–111)
Creatinine: 0.66 mg/dL (ref 0.44–1.00)
GFR, Est AFR Am: >60 mL/min (ref >60)
GFR, Estimated: >60 mL/min (ref >60)
Glucose, Bld: 62 mg/dL — ABNORMAL LOW (ref 70–99)
Potassium: 4 mmol/L (ref 3.5–5.1)
Sodium: 139 mmol/L (ref 135–145)
Total Bilirubin: 0.3 mg/dL (ref 0.3–1.2)
Total Protein: 7.5 g/dL (ref 6.5–8.1)

## 2020-05-29 LAB — CBC WITH DIFFERENTIAL (CANCER CENTER ONLY)
Abs Immature Granulocytes: 0.02 10*3/uL (ref 0.00–0.07)
Basophils Absolute: 0.1 10*3/uL (ref 0.0–0.1)
Basophils Relative: 1 %
Eosinophils Absolute: 0.3 10*3/uL (ref 0.0–0.5)
Eosinophils Relative: 2 %
HCT: 38 % (ref 36.0–46.0)
Hemoglobin: 12.3 g/dL (ref 12.0–15.0)
Immature Granulocytes: 0 %
Lymphocytes Relative: 38 %
Lymphs Abs: 4.4 10*3/uL — ABNORMAL HIGH (ref 0.7–4.0)
MCH: 29.2 pg (ref 26.0–34.0)
MCHC: 32.4 g/dL (ref 30.0–36.0)
MCV: 90.3 fL (ref 80.0–100.0)
Monocytes Absolute: 1 10*3/uL (ref 0.1–1.0)
Monocytes Relative: 9 %
Neutro Abs: 5.9 10*3/uL (ref 1.7–7.7)
Neutrophils Relative %: 50 %
Platelet Count: 291 10*3/uL (ref 150–400)
RBC: 4.21 MIL/uL (ref 3.87–5.11)
RDW: 13.7 % (ref 11.5–15.5)
WBC Count: 11.7 10*3/uL — ABNORMAL HIGH (ref 4.0–10.5)
nRBC: 0 % (ref 0.0–0.2)

## 2020-05-29 LAB — VITAMIN B12: Vitamin B-12: 420 pg/mL (ref 180–914)

## 2020-05-29 NOTE — Progress Notes (Signed)
START ON PATHWAY REGIMEN - Colorectal     A cycle is every 14 days:     Oxaliplatin      Leucovorin      Fluorouracil      Fluorouracil   **Always confirm dose/schedule in your pharmacy ordering system**  Patient Characteristics: Postoperative without Neoadjuvant Therapy (Pathologic Staging), Colon, Stage III, High Risk (pT4 or pN2) Tumor Location: Colon Therapeutic Status: Postoperative without Neoadjuvant Therapy (Pathologic Staging) AJCC M Category: cM0 AJCC T Category: pT4 AJCC N Category: pN1 AJCC 8 Stage Grouping: Unknown Intent of Therapy: Curative Intent, Discussed with Patient

## 2020-05-29 NOTE — Progress Notes (Signed)
Hematology and Oncology Follow Up Visit  Bailey Walker [id number] [birth year] 74 y.o. 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  She seems to be doing better.  Her abdominal wound is healing up.  It is still a little open but is closing up nicely according to her daughter.  There does not appear to be any kind of infection.  She sees the surgeon in a couple days.  She has had no fever.  She is eating pretty well.  She has a colostomy that is functional.  She has had no issues from headache.  She has had no mouth sores.  She has had no leg swelling.  I think that we are now ready to try to start adjuvant chemotherapy for her.  She has stage IIIb disease.  She has 1+ lymph node.  I think she would do well with FOLFOX.  She would not lose her hair.  She should be able to manage toxicity.  She will need to have a Port-A-Cath placed.  Overall, her performance status is ECOG 1.    Medications:  Current Outpatient Medications:  .  aspirin EC 81 MG tablet, Take 81 mg by mouth daily. Swallow whole., Disp: , Rfl:  .  DEXILANT 30 MG capsule, TAKE 1 CAPSULE BY MOUTH EVERY DAY (Patient taking differently: Take 30 mg by mouth daily. ), Disp: 30 capsule, Rfl: 5 .  docusate sodium (COLACE) 100 MG capsule, Take 1 capsule (100 mg total) by mouth 2 (two) times daily., Disp: 10 capsule, Rfl: 0 .  feeding supplement, ENSURE ENLIVE, (ENSURE ENLIVE) LIQD, Take 237 mLs by mouth 3 (three) times daily between meals., Disp: 237 mL, Rfl: 12 .  glipiZIDE (GLUCOTROL) 5 MG tablet, TAKE 1 TABLET BY MOUTH TWICE A DAY BEFORE MEALS (Patient taking differently: Take 5 mg by mouth See admin instructions. Take one tablet (5 mg) by mouth twice daily - after lunch and at midnight), Disp: 60 tablet,  Rfl: 3 .  levothyroxine (SYNTHROID) 75 MCG tablet, TAKE 1 TABLET BY MOUTH EVERY DAY (Patient taking differently: Take 75 mcg by mouth daily before breakfast. ), Disp: 30 tablet, Rfl: 0 .  metFORMIN (GLUCOPHAGE) 500 MG tablet, Take 0.5 tablets (250 mg total) by mouth 2 (two) times daily with a meal. (Patient taking differently: Take 500 mg by mouth See admin instructions. Take one tablet (500 mg) by mouth twice daily - after lunch and at midnight), Disp: 90 tablet, Rfl: 3 .  methocarbamol (ROBAXIN) 500 MG tablet, Take 1 tablet (500 mg total) by mouth every 8 (eight) hours as needed for muscle spasms., Disp: 30 tablet, Rfl: 0 .  metoprolol tartrate (LOPRESSOR) 25 MG tablet, Take 1 tablet (25 mg total) by mouth 2 (two) times daily., Disp: 180 tablet, Rfl: 3 .  Multiple Vitamin (MULTIVITAMIN WITH MINERALS) TABS tablet, Take 1 tablet by mouth daily., Disp: 30 tablet, Rfl: 0 .  prednisoLONE acetate (PRED FORTE) 1 % ophthalmic suspension, Place 1 drop into both eyes See admin instructions. Instill one drop into both eyes on Monday, Wednesday, Friday nights, Disp: , Rfl:  .  alendronate (FOSAMAX) 70 MG tablet, Take 70 mg by mouth every Sunday.  (Patient not taking: Reported on 05/29/2020), Disp: , Rfl:  .  denosumab (  PROLIA) 60 MG/ML SOSY injection, Inject 60 mg into the skin every 6 (six) months. (Patient not taking: Reported on 05/29/2020), Disp: , Rfl:  .  lidocaine (LIDODERM) 5 %, Place 1 patch onto the skin daily. Remove & Discard patch within 12 hours or as directed by MD (Patient not taking: Reported on 05/29/2020), Disp: 15 patch, Rfl: 0 .  nitroGLYCERIN (NITROSTAT) 0.4 MG SL tablet, Place 1 tablet (0.4 mg total) under the tongue every 5 (five) minutes as needed for chest pain., Disp: 11 tablet, Rfl: 6 .  ondansetron (ZOFRAN) 4 MG tablet, Take 1 tablet (4 mg total) by mouth every 6 (six) hours as needed for nausea. (Patient not taking: Reported on 05/29/2020), Disp: 20 tablet, Rfl: 0  Allergies:  Allergies    Allergen Reactions  . Nitroglycerin Nausea And Vomiting  . Ampicillin Other (See Comments)    Per allergy test    Past Medical History, Surgical history, Social history, and Family History were reviewed and updated.  Review of Systems: Review of Systems  Constitutional: Positive for appetite change and unexpected weight change.  Eyes: Negative.   Respiratory: Negative.   Cardiovascular: Negative.   Gastrointestinal: Positive for abdominal pain and nausea.  Endocrine: Negative.   Genitourinary: Negative.    Musculoskeletal: Negative.   Skin: Negative.   Neurological: Negative.   Hematological: Negative.   Psychiatric/Behavioral: Negative.     Physical Exam:  weight is 150 lb (68 kg). Her oral temperature is 98.2 F (36.8 C). Her blood pressure is 112/58 (abnormal) and her pulse is 93. Her respiration is 20 and oxygen saturation is 98%.   Wt Readings from Last 3 Encounters:  05/29/20 150 lb (68 kg)  05/08/20 146 lb (66.2 kg)  05/06/20 160 lb (72.6 kg)    Physical Exam Vitals reviewed.  HENT:     Head: Normocephalic and atraumatic.  Eyes:     Pupils: Pupils are equal, round, and reactive to light.  Cardiovascular:     Rate and Rhythm: Normal rate and regular rhythm.     Heart sounds: Normal heart sounds.  Pulmonary:     Effort: Pulmonary effort is normal.     Breath sounds: Normal breath sounds.  Abdominal:     General: Bowel sounds are normal.     Palpations: Abdomen is soft.     Comments: Abdominal exam shows a soft abdomen.  She has the wound in the anterior abdominal wall.  There was a picture taken in the emergency room.  This was quite significant.  It was none healing over yet.  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  There is some slight tenderness to palpation of the abdomen.  There is no palpable liver or spleen tip.  Musculoskeletal:        General: No tenderness or deformity. Normal range of motion.     Cervical back: Normal range of motion.   Lymphadenopathy:     Cervical: No cervical adenopathy.  Skin:    General: Skin is warm and dry.     Findings: No erythema or rash.  Neurological:     Mental Status: She is alert and oriented to person, place, and time.  Psychiatric:        Behavior: Behavior normal.        Thought Content: Thought content normal.        Judgment: Judgment normal.      Lab Results  Component Value Date   WBC 11.7 (H) 05/29/2020   HGB 12.3  05/29/2020   HCT 38.0 05/29/2020   MCV 90.3 05/29/2020   PLT 291 05/29/2020     Chemistry      Component Value Date/Time   NA 139 05/29/2020 1402   NA 139 07/12/2019 1110   K 4.0 05/29/2020 1402   CL 101 05/29/2020 1402   CO2 26 05/29/2020 1402   BUN 15 05/29/2020 1402   BUN 15 07/12/2019 1110   CREATININE 0.66 05/29/2020 1402   CREATININE 0.74 01/08/2017 1100      Component Value Date/Time   CALCIUM 10.3 05/29/2020 1402   ALKPHOS 45 05/29/2020 1402   AST 17 05/29/2020 1402   ALT 10 05/29/2020 1402   BILITOT 0.3 05/29/2020 1402      Impression and Plan: Ms. Karnik is a very charming 74 year old woman.  She is [place].  She actually was living in [place].  [place].  I am happy that she is getting better.  I am happy that this wound is healing up.  Again, we are now on the way to try to start treatment on her.  I think 3 more weeks would be appropriate.  We will try FOLFOX.  I think this will be an appropriate.  I think that 8 cycles would be reasonable.  We probably will need to do another CT scan on her so we can see how everything looks.  Her last CEA level was 3.47.  I went over toxicity with her.  I explained the side effects.  Her daughter was very good at being the Optometrist.  We will have a Port-A-Cath placed.  We will do this in 3 weeks.  I will see her back myself we start the first cycle of treatment.  I will then plan for follow-up 2 weeks after that.    Volanda Napoleon, MD 9/7/20213:12  PM

## 2020-05-30 LAB — CEA (IN HOUSE-CHCC): CEA (CHCC-In House): 2.8 ng/mL (ref 0.00–5.00)

## 2020-05-30 LAB — IRON AND TIBC
Iron: 47 ug/dL (ref 41–142)
Saturation Ratios: 15 % — ABNORMAL LOW (ref 21–57)
TIBC: 304 ug/dL (ref 236–444)
UIBC: 257 ug/dL (ref 120–384)

## 2020-05-30 LAB — LACTATE DEHYDROGENASE: LDH: 121 U/L (ref 98–192)

## 2020-05-30 LAB — FERRITIN: Ferritin: 98 ng/mL (ref 11–307)

## 2020-06-11 ENCOUNTER — Other Ambulatory Visit: Payer: Self-pay | Admitting: Radiology

## 2020-06-11 ENCOUNTER — Other Ambulatory Visit: Payer: Self-pay | Admitting: *Deleted

## 2020-06-11 DIAGNOSIS — C187 Malignant neoplasm of sigmoid colon: Secondary | ICD-10-CM

## 2020-06-11 DIAGNOSIS — C772 Secondary and unspecified malignant neoplasm of intra-abdominal lymph nodes: Secondary | ICD-10-CM

## 2020-06-11 MED ORDER — PROCHLORPERAZINE MALEATE 10 MG PO TABS: 10.0000 mg | ORAL_TABLET | Freq: Four times a day (QID) | ORAL | 1 refills | Status: DC | PRN

## 2020-06-11 MED ORDER — ONDANSETRON HCL 8 MG PO TABS: 8.0000 mg | ORAL_TABLET | Freq: Two times a day (BID) | ORAL | 1 refills | Status: DC | PRN

## 2020-06-11 MED ORDER — LIDOCAINE-PRILOCAINE 2.5-2.5 % EX CREA: TOPICAL_CREAM | CUTANEOUS | 3 refills | Status: DC

## 2020-06-11 MED ORDER — DEXAMETHASONE 4 MG PO TABS: 8.0000 mg | ORAL_TABLET | Freq: Every day | ORAL | 1 refills | Status: DC

## 2020-06-12 ENCOUNTER — Other Ambulatory Visit: Payer: Self-pay

## 2020-06-12 ENCOUNTER — Other Ambulatory Visit: Payer: Medicaid Other

## 2020-06-12 ENCOUNTER — Other Ambulatory Visit: Payer: Self-pay | Admitting: Radiology

## 2020-06-12 ENCOUNTER — Ambulatory Visit: Payer: Medicaid Other | Admitting: Hematology and Oncology

## 2020-06-12 ENCOUNTER — Ambulatory Visit: Payer: Medicaid Other

## 2020-06-12 ENCOUNTER — Encounter: Payer: Self-pay | Admitting: *Deleted

## 2020-06-12 ENCOUNTER — Inpatient Hospital Stay: Payer: Medicaid Other

## 2020-06-12 NOTE — Progress Notes (Signed)
Patient in chemotherapy education class with self and niece [name].  Discussed side effects of      5FU, Oxaliplatin, Leucovorinw which include but are not limited to myelosuppression, decreased appetite, fatigue, fever, allergic or infusional reaction, mucositis, cardiac toxicity, cough, SOB, altered taste, nausea and vomiting, diarrhea, constipation, elevated LFTs myalgia and arthralgias, hair loss or thinning, rash, skin dryness, nail changes, peripheral neuropathy, discolored urine, delayed wound healing, mental changes (Chemo brain), increased risk of infections, weight loss.  Reviewed infusion room and office policy and procedure and phone numbers 24 hours x 7 days a week.  Reviewed ambulatory pump specifics and how to manage safe handling at home.  Reviewed when to call the office with any concerns or problems.  Scientist, clinical (histocompatibility and immunogenetics) given.  Discussed portacath insertion and EMLA cream administration.  Antiemetic protocol and chemotherapy schedule reviewed. Patient verbalized understanding of chemotherapy indications and possible side effects.  Teachback done

## 2020-06-13 ENCOUNTER — Encounter (HOSPITAL_COMMUNITY): Payer: Self-pay

## 2020-06-13 ENCOUNTER — Other Ambulatory Visit: Payer: Self-pay

## 2020-06-13 ENCOUNTER — Other Ambulatory Visit: Payer: Self-pay | Admitting: Internal Medicine

## 2020-06-13 ENCOUNTER — Ambulatory Visit (HOSPITAL_COMMUNITY)
Admission: RE | Admit: 2020-06-13 | Discharge: 2020-06-13 | Disposition: A | Discharge status: Home / Self Care | Payer: Medicaid Other | Source: Ambulatory Visit

## 2020-06-13 ENCOUNTER — Ambulatory Visit (HOSPITAL_COMMUNITY)
Admission: RE | Admit: 2020-06-13 | Discharge: 2020-06-13 | Disposition: A | Discharge status: Home / Self Care | Payer: Medicaid Other | Source: Ambulatory Visit | Attending: Hematology & Oncology | Admitting: Hematology & Oncology

## 2020-06-13 ENCOUNTER — Other Ambulatory Visit: Payer: Self-pay | Admitting: Hematology & Oncology

## 2020-06-13 DIAGNOSIS — C187 Malignant neoplasm of sigmoid colon: Secondary | ICD-10-CM | POA: Diagnosis present

## 2020-06-13 DIAGNOSIS — Z87891 Personal history of nicotine dependence: Secondary | ICD-10-CM | POA: Insufficient documentation

## 2020-06-13 DIAGNOSIS — Z79899 Other long term (current) drug therapy: Secondary | ICD-10-CM | POA: Diagnosis not present

## 2020-06-13 DIAGNOSIS — I471 Supraventricular tachycardia: Secondary | ICD-10-CM | POA: Insufficient documentation

## 2020-06-13 DIAGNOSIS — C772 Secondary and unspecified malignant neoplasm of intra-abdominal lymph nodes: Secondary | ICD-10-CM | POA: Insufficient documentation

## 2020-06-13 DIAGNOSIS — Z7984 Long term (current) use of oral hypoglycemic drugs: Secondary | ICD-10-CM | POA: Diagnosis not present

## 2020-06-13 DIAGNOSIS — E119 Type 2 diabetes mellitus without complications: Secondary | ICD-10-CM | POA: Insufficient documentation

## 2020-06-13 DIAGNOSIS — Z7982 Long term (current) use of aspirin: Secondary | ICD-10-CM | POA: Diagnosis not present

## 2020-06-13 DIAGNOSIS — Z79890 Hormone replacement therapy (postmenopausal): Secondary | ICD-10-CM | POA: Diagnosis not present

## 2020-06-13 DIAGNOSIS — E079 Disorder of thyroid, unspecified: Secondary | ICD-10-CM | POA: Insufficient documentation

## 2020-06-13 DIAGNOSIS — I10 Essential (primary) hypertension: Secondary | ICD-10-CM | POA: Insufficient documentation

## 2020-06-13 DIAGNOSIS — K219 Gastro-esophageal reflux disease without esophagitis: Secondary | ICD-10-CM

## 2020-06-13 HISTORY — PX: IR IMAGING GUIDED PORT INSERTION: IMG5740

## 2020-06-13 LAB — GLUCOSE, CAPILLARY: Glucose-Capillary: 83 mg/dL (ref 70–99)

## 2020-06-13 MED ORDER — VANCOMYCIN HCL IN DEXTROSE 1-5 GM/200ML-% IV SOLN
INTRAVENOUS | Status: AC
  Administered 2020-06-13: 1000 mg via INTRAVENOUS
  Filled 2020-06-13: qty 200

## 2020-06-13 MED ORDER — HEPARIN SOD (PORK) LOCK FLUSH 100 UNIT/ML IV SOLN
INTRAVENOUS | Status: AC
  Filled 2020-06-13: qty 5

## 2020-06-13 MED ORDER — LIDOCAINE-EPINEPHRINE 1 %-1:100000 IJ SOLN
INTRAMUSCULAR | Status: AC
  Filled 2020-06-13: qty 1

## 2020-06-13 MED ORDER — SODIUM CHLORIDE 0.9 % IV SOLN: INTRAVENOUS | Status: DC

## 2020-06-13 MED ORDER — FENTANYL CITRATE (PF) 100 MCG/2ML IJ SOLN
INTRAMUSCULAR | Status: DC
Start: 2020-06-13 — End: 2020-06-14
  Filled 2020-06-13: qty 2

## 2020-06-13 MED ORDER — VANCOMYCIN HCL IN DEXTROSE 1-5 GM/200ML-% IV SOLN: 1000.0000 mg | Freq: Once | INTRAVENOUS | Status: AC

## 2020-06-13 MED ORDER — MIDAZOLAM HCL 2 MG/2ML IJ SOLN
INTRAMUSCULAR | Status: AC | PRN
  Administered 2020-06-13 (×3): 1 mg via INTRAVENOUS

## 2020-06-13 MED ORDER — MIDAZOLAM HCL 2 MG/2ML IJ SOLN
INTRAMUSCULAR | Status: AC
  Filled 2020-06-13: qty 4

## 2020-06-13 MED ORDER — FENTANYL CITRATE (PF) 100 MCG/2ML IJ SOLN
INTRAMUSCULAR | Status: AC | PRN
Start: 2020-06-13 — End: 2020-06-13
  Administered 2020-06-13 (×2): 50 ug via INTRAVENOUS

## 2020-06-13 NOTE — Discharge Instructions (Signed)
Do not use the EMLA cream (lidocaine cream) on your new port until it has healed. The cream will dissolve the skin glue and the skin will come apart resulting in an infection.   Implanted Port Insertion, Care After This sheet gives you information about how to care for yourself after your procedure. Your health care provider may also give you more specific instructions. If you have problems or questions, contact your health care provider. What can I expect after the procedure? After the procedure, it is common to have:  Discomfort at the port insertion site.  Bruising on the skin over the port. This should improve over 3-4 days. Follow these instructions at home: [facility] care  After your port is placed, you will get a manufacturer's information card. The card has information about your port. Keep this card with you at all times.  Take care of the port as told by your health care provider. Ask your health care provider if you or a family member can get training for taking care of the port at home. A home health care nurse may also take care of the port.  Make sure to remember what type of port you have. Incision care      Follow instructions from your health care provider about how to take care of your port insertion site. Make sure you: ? Wash your hands with soap and water before and after you change your bandage (dressing). If soap and water are not available, use hand sanitizer. ? Change your dressing as told by your health care provider.  Leave  skin glue in place.   Check your port insertion site every day for signs of infection. Check for: ? Redness, swelling, or pain. ? Fluid or blood. ? Warmth. ? Pus or a bad smell. Activity  Return to your normal activities as told by your health care provider. Ask your health care provider what activities are safe for you.  Do not lift anything that is heavier than 10 lb (4.5 kg), or the limit that you are told, until your health care  provider says that it is safe. General instructions  Take over-the-counter and prescription medicines only as told by your health care provider.  Do not take baths, swim, or use a hot tub until your health care provider approves. You may only be allowed to take sponge baths.  Do not drive for 24 hours if you were given a sedative during your procedure.  Wear a medical alert bracelet in case of an emergency. This will tell any health care providers that you have a port.  Keep all follow-up visits as told by your health care provider. This is important. Contact a health care provider if:  You cannot flush your port with saline as directed, or you cannot draw blood from the port.  You have a fever or chills.  You have redness, swelling, or pain around your port insertion site.  You have fluid or blood coming from your port insertion site.  Your port insertion site feels warm to the touch.  You have pus or a bad smell coming from the port insertion site. Get help right away if:  You have chest pain or shortness of breath.  You have bleeding from your port that you cannot control. Summary  Take care of the port as told by your health care provider. Keep the manufacturer's information card with you at all times.  Change your dressing as told by your health care provider.  Contact a health care provider if you have a fever or chills or if you have redness, swelling, or pain around your port insertion site.  Keep all follow-up visits as told by your health care provider. This information is not intended to replace advice given to you by your health care provider. Make sure you discuss any questions you have with your health care provider. Document Revised: 04/06/2018 Document Reviewed: 04/06/2018 Elsevier Patient Education  [place].     Moderate Conscious Sedation, Adult, Care After These instructions provide you with information about caring for yourself after your  procedure. Your health care provider may also give you more specific instructions. Your treatment has been planned according to current medical practices, but problems sometimes occur. Call your health care provider if you have any problems or questions after your procedure. What can I expect after the procedure? After your procedure, it is common:  To feel sleepy for several hours.  To feel clumsy and have poor balance for several hours.  To have poor judgment for several hours.  To vomit if you eat too soon. Follow these instructions at home: For at least 24 hours after the procedure:   Do not: ? Participate in activities where you could fall or become injured. ? Drive. ? Use heavy machinery. ? Drink alcohol. ? Take sleeping pills or medicines that cause drowsiness. ? Make important decisions or sign legal documents. ? Take care of children on your own.  Rest. Eating and drinking  Follow the diet recommended by your health care provider.  If you vomit: ? Drink water, juice, or soup when you can drink without vomiting. ? Make sure you have little or no nausea before eating solid foods. General instructions  Have a responsible adult stay with you until you are awake and alert.  Take over-the-counter and prescription medicines only as told by your health care provider.  If you smoke, do not smoke without supervision.  Keep all follow-up visits as told by your health care provider. This is important. Contact a health care provider if:  You keep feeling nauseous or you keep vomiting.  You feel light-headed.  You develop a rash.  You have a fever. Get help right away if:  You have trouble breathing. This information is not intended to replace advice given to you by your health care provider. Make sure you discuss any questions you have with your health care provider. Document Revised: 08/21/2017 Document Reviewed: 12/29/2015 Elsevier Patient Education  2020  [employer].

## 2020-06-13 NOTE — H&P (Signed)
Chief Complaint: Patient was seen in consultation today for port-a-catheter placement  Referring Physician(s): Volanda Napoleon  Supervising Physician: Jacqulynn Cadet  Patient Status: [facility] - Out-pt  History of Present Illness: Bailey Walker is a 74 y.o. female with a medical history significant for DM, supraventricular tachycardia and colon cancer. She presented to the ED July 2021 with abdominal pain and imaging revealed an obstructing mass in the sigmoid colon. She underwent sigmoid colectomy and end colostomy with Dr. 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 The patient has plans to begin chemotherapy soon.   Interventional Radiology has been asked to evaluate this patient for the placement of an image-guided port-a-catheter to facilitate her treatment plans.   Past Medical History:  Diagnosis Date  . 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 Diabetes mellitus without complication ([place])   . Goals of care, counseling/discussion 04/26/2020  . Hypertension   . SVT (supraventricular tachycardia) ([place])   . Thyroid disease     Past Surgical History:  Procedure Laterality Date  . APPENDECTOMY    . BIOPSY  04/18/2020   Procedure: BIOPSY;  Surgeon: Lavena Bullion, DO;  Location: [place] [facility];  Service: Gastroenterology;;  . BIOPSY  04/19/2020   Procedure: BIOPSY;  Surgeon: Lavena Bullion, DO;  Location: [place];  Service: Gastroenterology;;  . BREAST SURGERY     Fatty tissue on biopsy  . ESOPHAGOGASTRODUODENOSCOPY (EGD) WITH PROPOFOL N/A 04/18/2020   Procedure: ESOPHAGOGASTRODUODENOSCOPY (EGD) WITH PROPOFOL;  Surgeon: Lavena Bullion, DO;  Location: [place];  Service: Gastroenterology;  Laterality: N/A;  . EYE SURGERY Bilateral    april and march 2019 for cataracts.   . fatty gland     left wrist , right breast  . FLEXIBLE SIGMOIDOSCOPY N/A 04/18/2020   Procedure: FLEXIBLE  SIGMOIDOSCOPY;  Surgeon: Lavena Bullion, DO;  Location: [place];  Service: Gastroenterology;  Laterality: N/A;  . FLEXIBLE SIGMOIDOSCOPY N/A 04/19/2020   Procedure: FLEXIBLE SIGMOIDOSCOPY;  Surgeon: Lavena Bullion, DO;  Location: [place];  Service: Gastroenterology;  Laterality: N/A;  . INCISION AND DRAINAGE OF WOUND N/A 05/01/2020   Procedure: ABDOMINAL  WOUND EXPLORATION; IRRIGATION AND DEBRIDEMENT WOUND;  Surgeon: Rolm Bookbinder, MD;  Location: [place];  Service: General;  Laterality: N/A;  . KNEE SURGERY     left knee  . LAPAROTOMY N/A 04/20/2020   Procedure: SIGMOID COLECTOMY AND COLOSTOMY;  Surgeon: Coralie Keens, MD;  Location: [place];  Service: General;  Laterality: N/A;  . SUBMUCOSAL TATTOO INJECTION  04/19/2020   Procedure: SUBMUCOSAL TATTOO INJECTION;  Surgeon: Lavena Bullion, DO;  Location: [facility];  Service: Gastroenterology;;    Allergies: Nitroglycerin and Ampicillin  Medications: Prior to Admission medications   Medication Sig Start Date End Date Taking? Authorizing Provider  aspirin EC 81 MG tablet Take 81 mg by mouth daily. Swallow whole.   Yes [provider]  DEXILANT 30 MG capsule TAKE 1 CAPSULE BY MOUTH EVERY DAY Patient taking differently: Take 30 mg by mouth daily.  10/20/19  Yes Tresa Garter, MD  docusate sodium (COLACE) 100 MG capsule Take 1 capsule (100 mg total) by mouth 2 (two) times daily. 05/02/20  Yes Sheikh, [place], DO  feeding supplement, ENSURE ENLIVE, (ENSURE ENLIVE) LIQD Take 237 mLs by mouth 3 (three) times daily between meals. 05/02/20  Yes Sheikh, Omair Latif, DO  glipiZIDE (GLUCOTROL) 5 MG tablet TAKE 1 TABLET BY MOUTH TWICE A DAY BEFORE MEALS Patient  taking differently: Take 5 mg by mouth See admin instructions. Take one tablet (5 mg) by mouth twice daily - after lunch and at midnight 11/04/19  Yes Dorena Dew, FNP  levothyroxine (SYNTHROID) 75 MCG tablet TAKE 1 TABLET BY MOUTH EVERY DAY Patient  taking differently: Take 75 mcg by mouth daily before breakfast.  01/02/20  Yes Dorena Dew, FNP  metFORMIN (GLUCOPHAGE) 500 MG tablet Take 0.5 tablets (250 mg total) by mouth 2 (two) times daily with a meal. Patient taking differently: Take 500 mg by mouth See admin instructions. Take one tablet (500 mg) by mouth twice daily - after lunch and at midnight 05/16/19  Yes [name], FNP  methocarbamol (ROBAXIN) 500 MG tablet Take 1 tablet (500 mg total) by mouth every 8 (eight) hours as needed for muscle spasms. 05/02/20  Yes Meuth, Brooke A, PA-C  metoprolol tartrate (LOPRESSOR) 25 MG tablet Take 1 tablet (25 mg total) by mouth 2 (two) times daily. 08/02/19  Yes Tresa Garter, MD  Multiple Vitamin (MULTIVITAMIN WITH MINERALS) TABS tablet Take 1 tablet by mouth daily. 05/02/20  Yes Sheikh, Omair Latif, DO  prednisoLONE acetate (PRED FORTE) 1 % ophthalmic suspension Place 1 drop into both eyes See admin instructions. Instill one drop into both eyes on Monday, Wednesday, Friday nights   Yes [provider]  alendronate (FOSAMAX) 70 MG tablet Take 70 mg by mouth every Sunday.  Patient not taking: Reported on 05/29/2020 03/01/20   [provider]  denosumab (PROLIA) 60 MG/ML SOSY injection Inject 60 mg into the skin every 6 (six) months. Patient not taking: Reported on 05/29/2020    [provider]  dexamethasone (DECADRON) 4 MG tablet Take 2 tablets (8 mg total) by mouth daily. Start the day after chemotherapy for 2 days. Take with food. 06/11/20   Volanda Napoleon, MD  lidocaine (LIDODERM) 5 % Place 1 patch onto the skin daily. Remove & Discard patch within 12 hours or as directed by MD Patient not taking: Reported on 05/29/2020 05/02/20   Wellington Hampshire, PA-C  lidocaine-prilocaine (EMLA) cream Apply to affected area once 06/11/20   Ennever, Rudell Cobb, MD  nitroGLYCERIN (NITROSTAT) 0.4 MG SL tablet Place 1 tablet (0.4 mg total) under the tongue every 5 (five) minutes as needed  for chest pain. 05/16/19 04/16/20  [name], FNP  ondansetron (ZOFRAN) 4 MG tablet Take 1 tablet (4 mg total) by mouth every 6 (six) hours as needed for nausea. Patient not taking: Reported on 05/29/2020 05/02/20   Raiford Noble Latif, DO  ondansetron (ZOFRAN) 8 MG tablet Take 1 tablet (8 mg total) by mouth 2 (two) times daily as needed for refractory nausea / vomiting. Start on day 3 after chemotherapy. 06/11/20   Volanda Napoleon, MD  prochlorperazine (COMPAZINE) 10 MG tablet Take 1 tablet (10 mg total) by mouth every 6 (six) hours as needed (Nausea or vomiting). 06/11/20   Volanda Napoleon, MD     Family History  Problem Relation Age of Onset  . Breast cancer Cousin        mat and pat sides  . Cancer Sister        cancer everywhere  . Colon cancer Neg Hx   . Esophageal cancer Neg Hx   . Rectal cancer Neg Hx     Social History   Socioeconomic History  . Marital status: Single    Spouse name: Not on file  . Number of children: 0  . Years of  education: Not on file  . Highest education level: Not on file  Occupational History  . Occupation: retired  Tobacco Use  . Smoking status: Former Smoker    Quit date: 12/29/2013    Years since quitting: 6.4  . Smokeless tobacco: Never Used  Vaping Use  . Vaping Use: Never used  Substance and Sexual Activity  . Alcohol use: No  . Drug use: No  . Sexual activity: Not on file  Other Topics Concern  . Not on file  Social History Narrative  . Not on file   Social Determinants of Health   Financial Resource Strain:   . Difficulty of Paying Living Expenses: Not on file  Food Insecurity:   . Worried About Charity fundraiser in the Last Year: Not on file  . Ran Out of Food in the Last Year: Not on file  Transportation Needs:   . Lack of Transportation (Medical): Not on file  . Lack of Transportation (Non-Medical): Not on file  Physical Activity:   . Days of Exercise per Week: Not on file  . Minutes of Exercise per Session: Not on  file  Stress:   . Feeling of Stress : Not on file  Social Connections:   . Frequency of Communication with Friends and Family: Not on file  . Frequency of Social Gatherings with Friends and Family: Not on file  . Attends Religious Services: Not on file  . Active Member of Clubs or Organizations: Not on file  . Attends Archivist Meetings: Not on file  . Marital Status: Not on file    Review of Systems: A 12 point ROS discussed and pertinent positives are indicated in the HPI above.  All other systems are negative.  Review of Systems  Constitutional: Negative for appetite change and fatigue.  Respiratory: Negative for cough and shortness of breath.   Gastrointestinal: Positive for abdominal pain. Negative for diarrhea, nausea and vomiting.       Mild pain secondary to surgery site/colostomy/wound vac  Neurological: Positive for dizziness and light-headedness.    Vital Signs: BP (!) 157/75 (BP Location: Right Arm)   Pulse (!) 122   Temp 98.3 F (36.8 C) (Tympanic)   Resp 18   SpO2 95%   Physical Exam Constitutional:      General: She is not in acute distress. HENT:     Mouth/Throat:     Mouth: Mucous membranes are moist.     Pharynx: Oropharynx is clear.  Cardiovascular:     Rate and Rhythm: Normal rate and regular rhythm.     Pulses: Normal pulses.     Heart sounds: Normal heart sounds.  Pulmonary:     Effort: Pulmonary effort is normal.     Breath sounds: Normal breath sounds.  Abdominal:     General: Bowel sounds are normal.     Palpations: Abdomen is soft.     Comments: Negative pressure dressing/wound vac in place. Colostomy present.   Musculoskeletal:        General: Normal range of motion.  Skin:    General: Skin is warm and dry.  Neurological:     Mental Status: She is alert and oriented to person, place, and time.     Imaging: No results found.  Labs:  CBC: Recent Labs    05/02/20 0233 05/06/20 1619 05/08/20 1104 05/29/20 1402  WBC  9.4 8.7 6.2 11.7*  HGB 9.4* 11.4* 11.6* 12.3  HCT 30.6* 37.7 37.0 38.0  PLT 443*  506* 422* 291    COAGS: Recent Labs    05/06/20 1619  INR 1.1    BMP: Recent Labs    05/02/20 0233 05/06/20 1619 05/08/20 1104 05/29/20 1402  NA 136 137 140 139  K 4.0 3.9 4.3 4.0  CL 100 102 102 101  CO2 27 23 29 26   GLUCOSE 124* 137* 146* 62*  BUN 11 14 17 15   CALCIUM 8.6* 9.1 9.9 10.3  CREATININE 0.49 0.55 0.67 0.66  GFRNONAA >60 >60 >60 >60  GFRAA >60 >60 >60 >60    LIVER FUNCTION TESTS: Recent Labs    05/02/20 0233 05/06/20 1619 05/08/20 1104 05/29/20 1402  BILITOT 0.2* 0.3 0.3 0.3  AST 23 30 18 17   ALT 24 29 21 10   ALKPHOS 43 51 49 45  PROT 5.9* 7.3 7.3 7.5  ALBUMIN 2.7* 3.4* 4.1 4.0    TUMOR MARKERS: No results for input(s): AFPTM, CEA, CA199, CHROMGRNA in the last 8760 hours.  Assessment and Plan:  Colon cancer; s/p sigmoid colectomy with end colostomy; plans for neoadjuvant chemotherapy: Bailey Walker, 74 year old female, presents today to the [facility] department for an image-guided port-a-catheter placement. The patient speaks Arabic; her niece Bailey Walker was present for interpreting assistance.  Risks and benefits of an image-guided Port-a-catheter placement were discussed with the patient including, but not limited to bleeding, infection, pneumothorax, or fibrin sheath development and need for additional procedures.  All of the patient's questions were answered, patient is agreeable to proceed.  Consent signed and in chart.  Thank you for this interesting consult.  I greatly enjoyed meeting Bailey Walker and Walker forward to participating in their care.  A copy of this report was sent to the requesting provider on this date.  Electronically Signed: Soyla Dryer, AGACNP-BC [phone] 06/13/2020, 1:09 PM   I spent a total of  30 Minutes   in face to face in clinical consultation, greater than 50% of which was  counseling/coordinating care for port-a-catheter placement.

## 2020-06-13 NOTE — Progress Notes (Signed)
Pharmacist Chemotherapy Monitoring - Initial Assessment    Anticipated start date: 06/20/20   Regimen:  . Are orders appropriate based on the patient's diagnosis, regimen, and cycle? Yes . Does the plan date match the patient's scheduled date? Yes . Is the sequencing of drugs appropriate? Yes . Are the premedications appropriate for the patient's regimen? Yes . Prior Authorization for treatment is: Approved o If applicable, is the correct biosimilar selected based on the patient's insurance? not applicable  Organ Function and Labs: Marland Kitchen Are dose adjustments needed based on the patient's renal function, hepatic function, or hematologic function? No . Are appropriate labs ordered prior to the start of patient's treatment? Yes . Other organ system assessment, if indicated: N/A . The following baseline labs, if indicated, have been ordered: N/A  Dose Assessment: . Are the drug doses appropriate? Yes . Are the following correct: o Drug concentrations Yes o IV fluid compatible with drug Yes o Administration routes Yes o Timing of therapy Yes . If applicable, does the patient have documented access for treatment and/or plans for port-a-cath placement? yes . If applicable, have lifetime cumulative doses been properly documented and assessed? not applicable Lifetime Dose Tracking  No doses have been documented on this patient for the following tracked chemicals: Doxorubicin, Epirubicin, Idarubicin, Daunorubicin, Mitoxantrone, Bleomycin, Oxaliplatin, Carboplatin, Liposomal Doxorubicin  o   Toxicity Monitoring/Prevention: . The patient has the following take home antiemetics prescribed: Ondansetron, Prochlorperazine, Dexamethasone and Lorazepam . The patient has the following take home medications prescribed: N/A . Medication allergies and previous infusion related reactions, if applicable, have been reviewed and addressed. Yes . The patient's current medication list has been assessed for  drug-drug interactions with their chemotherapy regimen. no significant drug-drug interactions were identified on review.  Order Review: . Are the treatment plan orders signed? Yes . Is the patient scheduled to see a provider prior to their treatment? Yes  I verify that I have reviewed each item in the above checklist and answered each question accordingly.  Bailey Walker, Jacqlyn Larsen 06/13/2020 1:12 PM

## 2020-06-13 NOTE — Progress Notes (Signed)
Patient arrives with her niece, [name]. Reviewed pre and post procedure. [name] is interpretor as Associate Professor does not have [place].

## 2020-06-13 NOTE — Procedures (Signed)
Interventional Radiology Procedure Note  Procedure: Placement of a right IJ approach single lumen PowerPort.  Tip is positioned at the superior cavoatrial junction and catheter is ready for immediate use.  Complications: No immediate Recommendations:  - Ok to shower tomorrow - Do not submerge for 7 days - Routine line care   Signed,  Kelyn Ponciano K. Carlisle Torgeson, MD   

## 2020-06-15 ENCOUNTER — Telehealth: Payer: Self-pay

## 2020-06-15 NOTE — Telephone Encounter (Signed)
Received VM from Caryl Pina, RN Case Manager with [facility] stating that she has been trying to reach pt's PCP without success to get an order for pt to have personal care services, such as a personal health aide, so that pt can successfully remain living in the home with her nieces. Caryl Pina questions if Dr Marin Olp would be comfortable placing this order.   OK per Dr Marin Olp. At [facility] request, script stating "PCS provided per treatment plan. Code: [id number]," faxed to her attn at [phone].  Ashley's callback: [phone]. dph

## 2020-06-17 ENCOUNTER — Emergency Department (HOSPITAL_BASED_OUTPATIENT_CLINIC_OR_DEPARTMENT_OTHER): Payer: Medicaid Other

## 2020-06-17 ENCOUNTER — Encounter (HOSPITAL_BASED_OUTPATIENT_CLINIC_OR_DEPARTMENT_OTHER): Payer: Self-pay | Admitting: Emergency Medicine

## 2020-06-17 ENCOUNTER — Emergency Department (HOSPITAL_BASED_OUTPATIENT_CLINIC_OR_DEPARTMENT_OTHER)
Admission: EM | Admit: 2020-06-17 | Discharge: 2020-06-17 | Disposition: A | Discharge status: Home / Self Care | Payer: Medicaid Other | Attending: Emergency Medicine | Admitting: Emergency Medicine

## 2020-06-17 ENCOUNTER — Other Ambulatory Visit: Payer: Self-pay

## 2020-06-17 DIAGNOSIS — W19XXXA Unspecified fall, initial encounter: Secondary | ICD-10-CM | POA: Diagnosis not present

## 2020-06-17 DIAGNOSIS — S0083XA Contusion of other part of head, initial encounter: Secondary | ICD-10-CM | POA: Diagnosis not present

## 2020-06-17 DIAGNOSIS — Z85038 Personal history of other malignant neoplasm of large intestine: Secondary | ICD-10-CM | POA: Insufficient documentation

## 2020-06-17 DIAGNOSIS — Z85858 Personal history of malignant neoplasm of other endocrine glands: Secondary | ICD-10-CM | POA: Diagnosis not present

## 2020-06-17 DIAGNOSIS — S0993XA Unspecified injury of face, initial encounter: Secondary | ICD-10-CM | POA: Diagnosis present

## 2020-06-17 DIAGNOSIS — Z7984 Long term (current) use of oral hypoglycemic drugs: Secondary | ICD-10-CM | POA: Insufficient documentation

## 2020-06-17 DIAGNOSIS — Z79890 Hormone replacement therapy (postmenopausal): Secondary | ICD-10-CM | POA: Diagnosis not present

## 2020-06-17 DIAGNOSIS — E039 Hypothyroidism, unspecified: Secondary | ICD-10-CM | POA: Insufficient documentation

## 2020-06-17 DIAGNOSIS — S8001XA Contusion of right knee, initial encounter: Secondary | ICD-10-CM | POA: Diagnosis not present

## 2020-06-17 DIAGNOSIS — E1169 Type 2 diabetes mellitus with other specified complication: Secondary | ICD-10-CM | POA: Insufficient documentation

## 2020-06-17 DIAGNOSIS — Z87891 Personal history of nicotine dependence: Secondary | ICD-10-CM | POA: Insufficient documentation

## 2020-06-17 DIAGNOSIS — I10 Essential (primary) hypertension: Secondary | ICD-10-CM | POA: Insufficient documentation

## 2020-06-17 DIAGNOSIS — S8000XA Contusion of unspecified knee, initial encounter: Secondary | ICD-10-CM

## 2020-06-17 DIAGNOSIS — Z79899 Other long term (current) drug therapy: Secondary | ICD-10-CM | POA: Insufficient documentation

## 2020-06-17 DIAGNOSIS — Z7982 Long term (current) use of aspirin: Secondary | ICD-10-CM | POA: Diagnosis not present

## 2020-06-17 LAB — CBC WITH DIFFERENTIAL/PLATELET
Abs Immature Granulocytes: 0.06 10*3/uL (ref 0.00–0.07)
Basophils Absolute: 0.1 10*3/uL (ref 0.0–0.1)
Basophils Relative: 1 %
Eosinophils Absolute: 0.5 10*3/uL (ref 0.0–0.5)
Eosinophils Relative: 5 %
HCT: 37.1 % (ref 36.0–46.0)
Hemoglobin: 11.8 g/dL — ABNORMAL LOW (ref 12.0–15.0)
Immature Granulocytes: 1 %
Lymphocytes Relative: 33 %
Lymphs Abs: 3.6 10*3/uL (ref 0.7–4.0)
MCH: 28.1 pg (ref 26.0–34.0)
MCHC: 31.8 g/dL (ref 30.0–36.0)
MCV: 88.3 fL (ref 80.0–100.0)
Monocytes Absolute: 0.9 10*3/uL (ref 0.1–1.0)
Monocytes Relative: 9 %
Neutro Abs: 5.7 10*3/uL (ref 1.7–7.7)
Neutrophils Relative %: 51 %
Platelets: 306 10*3/uL (ref 150–400)
RBC: 4.2 MIL/uL (ref 3.87–5.11)
RDW: 13.8 % (ref 11.5–15.5)
WBC: 10.8 10*3/uL — ABNORMAL HIGH (ref 4.0–10.5)
nRBC: 0 % (ref 0.0–0.2)

## 2020-06-17 LAB — BASIC METABOLIC PANEL
Anion gap: 13 (ref 5–15)
BUN: 17 mg/dL (ref 8–23)
CO2: 23 mmol/L (ref 22–32)
Calcium: 9.2 mg/dL (ref 8.9–10.3)
Chloride: 98 mmol/L (ref 98–111)
Creatinine, Ser: 0.57 mg/dL (ref 0.44–1.00)
GFR calc Af Amer: >60 mL/min (ref >60)
GFR calc non Af Amer: >60 mL/min (ref >60)
Glucose, Bld: 108 mg/dL — ABNORMAL HIGH (ref 70–99)
Potassium: 4.1 mmol/L (ref 3.5–5.1)
Sodium: 134 mmol/L — ABNORMAL LOW (ref 135–145)

## 2020-06-17 MED ORDER — IOHEXOL 300 MG/ML  SOLN
100.0000 mL | Freq: Once | INTRAMUSCULAR | Status: AC | PRN
  Administered 2020-06-17: 100 mL via INTRAVENOUS

## 2020-06-17 NOTE — ED Provider Notes (Signed)
[facility] EMERGENCY DEPARTMENT Provider Note   CSN: [id number] Arrival date & time: 06/17/20  1444     History Chief Complaint  Patient presents with  . Fall    Bailey Walker is a 74 y.o. female.  HPI Patient is translated by family member. Had a fall. Lost her balance and fell forward hitting her head and abdomen. Complaining of pain in right knee abdomen and left forehead. No loss conscious. States she does have some pain in her eye when she tries to move it around. No vision change however. Has had colon surgery for colon cancer and has colostomy and wound VAC in place. States since the fall has been hurting more in a different spot. Now hurts in the right lower quadrant. Also pain right knee with palpation.    Past Medical History:  Diagnosis Date  . 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 Diabetes mellitus without complication ([place])   . Goals of care, counseling/discussion 04/26/2020  . Hypertension   . SVT (supraventricular tachycardia) ([place])   . Thyroid disease     Patient Active Problem List   Diagnosis Date Noted  . 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 Goals of care, counseling/discussion 04/26/2020  . Gastritis and gastroduodenitis   . Grade II internal hemorrhoids   . Abdominal distension   . Nausea and vomiting in adult   . Abnormal CT scan, gastrointestinal tract   . Colon obstruction ([place]) 04/16/2020  . UTI (urinary tract infection) 04/16/2020  . Dyspnea on exertion 01/27/2020  . Iron deficiency anemia 01/26/2020  . Vitamin B12 deficiency anemia 01/26/2020  . 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 Fuchs' corneal dystrophy 09/20/2018  . 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 Diabetes mellitus due to underlying condition with unspecified  complications ([place]) 27/78/2423  . Ex-smoker 08/26/2017  . Atypical chest pain 08/26/2017  . Essential hypertension 01/11/2017  . Hypothyroidism 01/08/2017  . SVT (supraventricular tachycardia) ([place]) 01/08/2017    Past Surgical History:  Procedure Laterality Date  . APPENDECTOMY    . BIOPSY  04/18/2020   Procedure: BIOPSY;  Surgeon: Lavena Bullion, DO;  Location: [facility];  Service: Gastroenterology;;  . BIOPSY  04/19/2020   Procedure: BIOPSY;  Surgeon: Lavena Bullion, DO;  Location: [facility];  Service: Gastroenterology;;  . BREAST SURGERY     Fatty tissue on biopsy  . ESOPHAGOGASTRODUODENOSCOPY (EGD) WITH PROPOFOL N/A 04/18/2020   Procedure: ESOPHAGOGASTRODUODENOSCOPY (EGD) WITH PROPOFOL;  Surgeon: Lavena Bullion, DO;  Location: [facility];  Service: Gastroenterology;  Laterality: N/A;  . EYE SURGERY Bilateral    april and march 2019 for cataracts.   . fatty gland     left wrist , right breast  . FLEXIBLE SIGMOIDOSCOPY N/A 04/18/2020   Procedure: FLEXIBLE SIGMOIDOSCOPY;  Surgeon: Lavena Bullion, DO;  Location: [facility];  Service: Gastroenterology;  Laterality: N/A;  . FLEXIBLE SIGMOIDOSCOPY N/A 04/19/2020   Procedure: FLEXIBLE SIGMOIDOSCOPY;  Surgeon: Lavena Bullion, DO;  Location: [facility];  Service: Gastroenterology;  Laterality: N/A;  . INCISION AND DRAINAGE OF WOUND N/A 05/01/2020   Procedure: ABDOMINAL  WOUND EXPLORATION; IRRIGATION AND DEBRIDEMENT WOUND;  Surgeon: Rolm Bookbinder, MD;  Location: [facility];  Service: General;  Laterality: N/A;  . IR IMAGING GUIDED PORT INSERTION  06/13/2020  . KNEE SURGERY  left knee  . LAPAROTOMY N/A 04/20/2020   Procedure: SIGMOID COLECTOMY AND COLOSTOMY;  Surgeon: Coralie Keens, MD;  Location: [facility];  Service: General;  Laterality: N/A;  . SUBMUCOSAL TATTOO INJECTION  04/19/2020   Procedure: SUBMUCOSAL TATTOO INJECTION;  Surgeon: Lavena Bullion, DO;  Location: [facility];  Service: Gastroenterology;;      OB History   No obstetric history on file.     Family History  Problem Relation Age of Onset  . Breast cancer Cousin        mat and pat sides  . Cancer Sister        cancer everywhere  . Colon cancer Neg Hx   . Esophageal cancer Neg Hx   . Rectal cancer Neg Hx     Social History   Tobacco Use  . Smoking status: Former Smoker    Quit date: 12/29/2013    Years since quitting: 6.4  . Smokeless tobacco: Never Used  Vaping Use  . Vaping Use: Never used  Substance Use Topics  . Alcohol use: No  . Drug use: No    Home Medications Prior to Admission medications   Medication Sig Start Date End Date Taking? Authorizing Provider  alendronate (FOSAMAX) 70 MG tablet Take 70 mg by mouth every Sunday.  Patient not taking: Reported on 05/29/2020 03/01/20   [provider]  aspirin EC 81 MG tablet Take 81 mg by mouth daily. Swallow whole.    [provider]  denosumab (PROLIA) 60 MG/ML SOSY injection Inject 60 mg into the skin every 6 (six) months. Patient not taking: Reported on 05/29/2020    [provider]  dexamethasone (DECADRON) 4 MG tablet Take 2 tablets (8 mg total) by mouth daily. Start the day after chemotherapy for 2 days. Take with food. 06/11/20   Volanda Napoleon, MD  DEXILANT 30 MG capsule TAKE 1 CAPSULE BY MOUTH EVERY DAY Patient taking differently: Take 30 mg by mouth daily.  10/20/19   Tresa Garter, MD  docusate sodium (COLACE) 100 MG capsule Take 1 capsule (100 mg total) by mouth 2 (two) times daily. 05/02/20   Raiford Noble Latif, DO  feeding supplement, ENSURE ENLIVE, (ENSURE ENLIVE) LIQD Take 237 mLs by mouth 3 (three) times daily between meals. 05/02/20   Sheikh, Omair Latif, DO  glipiZIDE (GLUCOTROL) 5 MG tablet TAKE 1 TABLET BY MOUTH TWICE A DAY BEFORE MEALS Patient taking differently: Take 5 mg by mouth See admin instructions. Take one tablet (5 mg) by mouth twice daily - after lunch and at midnight 11/04/19   Dorena Dew, FNP    levothyroxine (SYNTHROID) 75 MCG tablet TAKE 1 TABLET BY MOUTH EVERY DAY Patient taking differently: Take 75 mcg by mouth daily before breakfast.  01/02/20   Dorena Dew, FNP  lidocaine (LIDODERM) 5 % Place 1 patch onto the skin daily. Remove & Discard patch within 12 hours or as directed by MD Patient not taking: Reported on 05/29/2020 05/02/20   Wellington Hampshire, PA-C  lidocaine-prilocaine (EMLA) cream Apply to affected area once 06/11/20   Volanda Napoleon, MD  metFORMIN (GLUCOPHAGE) 500 MG tablet Take 0.5 tablets (250 mg total) by mouth 2 (two) times daily with a meal. Patient taking differently: Take 500 mg by mouth See admin instructions. Take one tablet (500 mg) by mouth twice daily - after lunch and at midnight 05/16/19   [name], FNP  methocarbamol (ROBAXIN) 500 MG tablet Take 1 tablet (500 mg total) by mouth  every 8 (eight) hours as needed for muscle spasms. 05/02/20   Meuth, Brooke A, PA-C  metoprolol tartrate (LOPRESSOR) 25 MG tablet Take 1 tablet (25 mg total) by mouth 2 (two) times daily. 08/02/19   Tresa Garter, MD  Multiple Vitamin (MULTIVITAMIN WITH MINERALS) TABS tablet Take 1 tablet by mouth daily. 05/02/20   Raiford Noble Latif, DO  nitroGLYCERIN (NITROSTAT) 0.4 MG SL tablet Place 1 tablet (0.4 mg total) under the tongue every 5 (five) minutes as needed for chest pain. 05/16/19 04/16/20  [name], FNP  ondansetron (ZOFRAN) 4 MG tablet Take 1 tablet (4 mg total) by mouth every 6 (six) hours as needed for nausea. Patient not taking: Reported on 05/29/2020 05/02/20   Raiford Noble Latif, DO  ondansetron (ZOFRAN) 8 MG tablet Take 1 tablet (8 mg total) by mouth 2 (two) times daily as needed for refractory nausea / vomiting. Start on day 3 after chemotherapy. 06/11/20   Volanda Napoleon, MD  prednisoLONE acetate (PRED FORTE) 1 % ophthalmic suspension Place 1 drop into both eyes See admin instructions. Instill one drop into both eyes on Monday, Wednesday, Friday nights     [provider]  prochlorperazine (COMPAZINE) 10 MG tablet Take 1 tablet (10 mg total) by mouth every 6 (six) hours as needed (Nausea or vomiting). 06/11/20   Volanda Napoleon, MD    Allergies    Nitroglycerin and Ampicillin  Review of Systems   Review of Systems  Constitutional: Negative for appetite change.  Eyes: Positive for pain. Negative for visual disturbance.  Respiratory: Negative for shortness of breath.   Cardiovascular: Negative for chest pain.  Gastrointestinal: Positive for abdominal pain.  Musculoskeletal: Negative for back pain.  Skin: Negative for rash.  Neurological: Negative for weakness and headaches.    Physical Exam Updated Vital Signs BP 107/73 (BP Location: Right Arm)   Pulse 81   Temp 98.1 F (36.7 C) (Oral)   Resp 16   Ht 5' (1.524 m)   Wt 68 kg   SpO2 98%   BMI 29.29 kg/m   Physical Exam Vitals and nursing note reviewed.  Constitutional:      Appearance: Normal appearance.  HENT:     Head:     Comments: Mild hematoma left forehead. Eye movements intact. Conjugate gaze. Pupils reactive. No tenderness over mid face. Eyes:     Pupils: Pupils are equal, round, and reactive to light.  Cardiovascular:     Rate and Rhythm: Regular rhythm.  Pulmonary:     Breath sounds: No wheezing or rhonchi.  Abdominal:     Tenderness: There is abdominal tenderness.     Comments: Mid abdominal wound VAC. Left lower quadrant colostomy. Tenderness with right lower quadrant without rebound or guarding.  Musculoskeletal:        General: Tenderness present.     Comments: Tenderness and ecchymosis over anterior right lower knee. Good range of motion however. Knee stable. No hip tenderness.  Skin:    General: Skin is warm.     Capillary Refill: Capillary refill takes less than 2 seconds.  Neurological:     Mental Status: She is oriented to person, place, and time.     ED Results / Procedures / Treatments   Labs (all labs ordered are listed, but only  abnormal results are displayed) Labs Reviewed  CBC WITH DIFFERENTIAL/PLATELET - Abnormal; Notable for the following components:      Result Value   WBC 10.8 (*)    Hemoglobin 11.8 (*)  All other components within normal limits  BASIC METABOLIC PANEL - Abnormal; Notable for the following components:   Sodium 134 (*)    Glucose, Bld 108 (*)    All other components within normal limits    EKG None  Radiology CT Head Wo Contrast  Result Date: 06/17/2020 CLINICAL DATA:  Trauma EXAM: CT HEAD WITHOUT CONTRAST CT MAXILLOFACIAL WITHOUT CONTRAST TECHNIQUE: Multidetector CT imaging of the head and maxillofacial structures were performed using the standard protocol without intravenous contrast. Multiplanar CT image reconstructions of the maxillofacial structures were also generated. COMPARISON:  09/05/2018 head CT and prior. FINDINGS: CT HEAD FINDINGS Brain: No acute infarct or intracranial hemorrhage. No mass lesion. No midline shift, ventriculomegaly or extra-axial fluid collection. Mild cerebral atrophy with ex vacuo dilatation. Mild chronic microvascular ischemic changes. Vascular: No hyperdense vessel or unexpected calcification. Skull: No calvarial fracture or focal lesion. Other: None. CT MAXILLOFACIAL FINDINGS Osseous: No fracture or mandibular dislocation. No destructive process. Patient is edentulous. Orbits: Negative. No traumatic or inflammatory finding. Sinuses: Clear. Soft tissues: Minimal left frontal scalp swelling. IMPRESSION: No acute intracranial process.  No fracture. Minimal left frontal scalp swelling. Electronically Signed   By: Primitivo Gauze M.D.   On: 06/17/2020 19:05   CT ABDOMEN PELVIS W CONTRAST  Result Date: 06/17/2020 CLINICAL DATA:  Patient with trauma to the abdomen. Status post fall. EXAM: CT ABDOMEN AND PELVIS WITH CONTRAST TECHNIQUE: Multidetector CT imaging of the abdomen and pelvis was performed using the standard protocol following bolus administration of  intravenous contrast. CONTRAST:  150mL OMNIPAQUE IOHEXOL 300 MG/ML  SOLN COMPARISON:  CT abdomen pelvis 04/30/2020 FINDINGS: Lower chest: Normal heart size. Dependent atelectasis within the bilateral lower lobes. No pleural effusion. Hepatobiliary: Liver is normal in size and contour. Redemonstrated blush of contrast within the posterior right hepatic lobe (image 32; series 2), potentially representing perfusion anomaly or flash filling hemangioma. Gallbladder is unremarkable. Pancreas: Unremarkable Spleen: Unremarkable Adrenals/Urinary Tract: Normal adrenal glands. Kidneys enhance symmetrically with contrast. No hydronephrosis. Urinary bladder is unremarkable. Stomach/Bowel: Patient status post sigmoid colectomy and colostomy within the lower anterior abdomen. No evidence for bowel obstruction. No free fluid or free intraperitoneal air. Small hiatal hernia. Otherwise normal morphology of the stomach. Vascular/Lymphatic: Normal caliber abdominal aorta. Peripheral calcified atherosclerotic plaque. No retroperitoneal lymphadenopathy. Unchanged 7 mm node adjacent to the left psoas (image 50; series 2). Reproductive: Unremarkable Other: Mild interval healing of ventral abdominal wall wound with wound VAC in place. Left anterior abdominal wall colostomy. Interval increase in size of 1.6 cm nodule within the right anterior abdomen (image 47; series 2). Musculoskeletal: No aggressive or acute appearing osseous lesions. Lumbar spine degenerative changes. IMPRESSION: 1. Mild interval healing of ventral abdominal wall wound with wound VAC in place. No evidence for bowel obstruction. 2. Interval increase in size of 1.6 cm nodule within the right anterior abdomen. This is nonspecific however may represent peritoneal metastatic disease. 3. Unchanged subcentimeter lymph node anterior to the left psoas. Electronically Signed   By: Lovey Newcomer M.D.   On: 06/17/2020 19:18   DG Knee Complete 4 Views Left  Result Date:  06/17/2020 CLINICAL DATA:  Patient status post fall.  Knee pain. EXAM: LEFT KNEE - COMPLETE 4+ VIEW COMPARISON:  None. FINDINGS: Normal anatomic alignment. No evidence for acute fracture or dislocation. No joint effusion. There is a radiodensity within the soft tissues inferior to the patella. IMPRESSION: 1. No acute osseous abnormality. 2. Radiodensity within the soft tissues inferior to the patella, nonspecific. Electronically  Signed   By: Lovey Newcomer M.D.   On: 06/17/2020 19:30   DG Knee Complete 4 Views Right  Result Date: 06/17/2020 CLINICAL DATA:  Pain post fall. EXAM: RIGHT KNEE - COMPLETE 4+ VIEW COMPARISON:  None. FINDINGS: No evidence of fracture, dislocation, or joint effusion. No evidence of arthropathy or other focal bone abnormality. Prepatellar soft tissue swelling. IMPRESSION: 1. No acute fracture or dislocation identified about the right knee. 2. Prepatellar soft tissue swelling. Electronically Signed   By: Fidela Salisbury M.D.   On: 06/17/2020 19:28   CT Maxillofacial Wo Contrast  Result Date: 06/17/2020 CLINICAL DATA:  Trauma EXAM: CT HEAD WITHOUT CONTRAST CT MAXILLOFACIAL WITHOUT CONTRAST TECHNIQUE: Multidetector CT imaging of the head and maxillofacial structures were performed using the standard protocol without intravenous contrast. Multiplanar CT image reconstructions of the maxillofacial structures were also generated. COMPARISON:  09/05/2018 head CT and prior. FINDINGS: CT HEAD FINDINGS Brain: No acute infarct or intracranial hemorrhage. No mass lesion. No midline shift, ventriculomegaly or extra-axial fluid collection. Mild cerebral atrophy with ex vacuo dilatation. Mild chronic microvascular ischemic changes. Vascular: No hyperdense vessel or unexpected calcification. Skull: No calvarial fracture or focal lesion. Other: None. CT MAXILLOFACIAL FINDINGS Osseous: No fracture or mandibular dislocation. No destructive process. Patient is edentulous. Orbits: Negative. No traumatic  or inflammatory finding. Sinuses: Clear. Soft tissues: Minimal left frontal scalp swelling. IMPRESSION: No acute intracranial process.  No fracture. Minimal left frontal scalp swelling. Electronically Signed   By: Primitivo Gauze M.D.   On: 06/17/2020 19:05    Procedures Procedures (including critical care time)  Medications Ordered in ED Medications  iohexol (OMNIPAQUE) 300 MG/ML solution 100 mL (100 mLs Intravenous Contrast Given 06/17/20 1843)    ED Course  I have reviewed the triage vital signs and the nursing notes.  Pertinent labs & imaging results that were available during my care of the patient were reviewed by me and considered in my medical decision making (see chart for details).    MDM Rules/Calculators/A&P                          Patient presents after a fall.  Knee x-rays reassuring.  Head CT reassuring and mild forehead hematoma.  Does have recent surgery for abdominal cancer.  CT scan reassuring from a traumatic aspect but does have a nodule that is enlarged.  Does have follow-up in a few days with her oncologist.  He can review that.  Will discharge home. Final Clinical Impression(s) / ED Diagnoses Final diagnoses:  Fall, initial encounter  Contusion of face, initial encounter  Contusion of knee, unspecified laterality, initial encounter    Rx / DC Orders ED Discharge Orders    None       Davonna Belling, MD 06/17/20 2011

## 2020-06-17 NOTE — ED Notes (Signed)
Attempted IV access times. 1. Pt has right chest PAC newly placed.

## 2020-06-17 NOTE — ED Triage Notes (Signed)
Per family patient lost balance and fell around noon.  Has hematoma to forehead.  Denies any LOC.  Also has a wound to the center of abdomen with wound vac as well as a colostomy.  That surgery was done July 30th.  Now reports abdomen is painful where it wasn't before.

## 2020-06-17 NOTE — ED Notes (Signed)
CT awaiting results of CMP prior to imaging per radiology protocol

## 2020-06-17 NOTE — ED Notes (Addendum)
Patient in [place] with family

## 2020-06-17 NOTE — Discharge Instructions (Signed)
Follow-up with your oncologist for the nodule in the abdomen.  Follow-up with your primary care doctor as needed for the pains.

## 2020-06-19 ENCOUNTER — Other Ambulatory Visit: Payer: Medicaid Other

## 2020-06-20 ENCOUNTER — Inpatient Hospital Stay: Payer: Medicaid Other

## 2020-06-20 ENCOUNTER — Other Ambulatory Visit: Payer: Self-pay

## 2020-06-20 ENCOUNTER — Inpatient Hospital Stay (HOSPITAL_BASED_OUTPATIENT_CLINIC_OR_DEPARTMENT_OTHER): Payer: Medicaid Other | Admitting: Hematology & Oncology

## 2020-06-20 ENCOUNTER — Encounter: Payer: Self-pay | Admitting: Hematology & Oncology

## 2020-06-20 ENCOUNTER — Telehealth: Payer: Self-pay | Admitting: Hematology & Oncology

## 2020-06-20 VITALS — BP 120/82 | HR 89 | Temp 98.0°F | Resp 16 | Wt 156.0 lb

## 2020-06-20 DIAGNOSIS — C772 Secondary and unspecified malignant neoplasm of intra-abdominal lymph nodes: Secondary | ICD-10-CM

## 2020-06-20 DIAGNOSIS — C187 Malignant neoplasm of sigmoid colon: Secondary | ICD-10-CM

## 2020-06-20 DIAGNOSIS — D519 Vitamin B12 deficiency anemia, unspecified: Secondary | ICD-10-CM

## 2020-06-20 DIAGNOSIS — Z5111 Encounter for antineoplastic chemotherapy: Secondary | ICD-10-CM | POA: Diagnosis not present

## 2020-06-20 DIAGNOSIS — D509 Iron deficiency anemia, unspecified: Secondary | ICD-10-CM

## 2020-06-20 LAB — CMP (CANCER CENTER ONLY)
ALT: 11 U/L (ref 0–44)
AST: 12 U/L — ABNORMAL LOW (ref 15–41)
Albumin: 3.9 g/dL (ref 3.5–5.0)
Alkaline Phosphatase: 41 U/L (ref 38–126)
Anion gap: 11 (ref 5–15)
BUN: 17 mg/dL (ref 8–23)
CO2: 25 mmol/L (ref 22–32)
Calcium: 9.4 mg/dL (ref 8.9–10.3)
Chloride: 100 mmol/L (ref 98–111)
Creatinine: 0.69 mg/dL (ref 0.44–1.00)
GFR, Est AFR Am: >60 mL/min (ref >60)
GFR, Estimated: >60 mL/min (ref >60)
Glucose, Bld: 220 mg/dL — ABNORMAL HIGH (ref 70–99)
Potassium: 4.1 mmol/L (ref 3.5–5.1)
Sodium: 136 mmol/L (ref 135–145)
Total Bilirubin: 0.3 mg/dL (ref 0.3–1.2)
Total Protein: 7.1 g/dL (ref 6.5–8.1)

## 2020-06-20 LAB — CBC WITH DIFFERENTIAL (CANCER CENTER ONLY)
Abs Immature Granulocytes: 0.06 10*3/uL (ref 0.00–0.07)
Basophils Absolute: 0.1 10*3/uL (ref 0.0–0.1)
Basophils Relative: 1 %
Eosinophils Absolute: 0.3 10*3/uL (ref 0.0–0.5)
Eosinophils Relative: 3 %
HCT: 36.3 % (ref 36.0–46.0)
Hemoglobin: 11.4 g/dL — ABNORMAL LOW (ref 12.0–15.0)
Immature Granulocytes: 1 %
Lymphocytes Relative: 27 %
Lymphs Abs: 2.6 10*3/uL (ref 0.7–4.0)
MCH: 28.4 pg (ref 26.0–34.0)
MCHC: 31.4 g/dL (ref 30.0–36.0)
MCV: 90.5 fL (ref 80.0–100.0)
Monocytes Absolute: 0.6 10*3/uL (ref 0.1–1.0)
Monocytes Relative: 6 %
Neutro Abs: 6 10*3/uL (ref 1.7–7.7)
Neutrophils Relative %: 62 %
Platelet Count: 268 10*3/uL (ref 150–400)
RBC: 4.01 MIL/uL (ref 3.87–5.11)
RDW: 13.8 % (ref 11.5–15.5)
WBC Count: 9.5 10*3/uL (ref 4.0–10.5)
nRBC: 0 % (ref 0.0–0.2)

## 2020-06-20 LAB — VITAMIN B12: Vitamin B-12: 310 pg/mL (ref 180–914)

## 2020-06-20 MED ORDER — SODIUM CHLORIDE 0.9% FLUSH
10.0000 mL | INTRAVENOUS | Status: DC | PRN
  Filled 2020-06-20: qty 10

## 2020-06-20 MED ORDER — HEPARIN SOD (PORK) LOCK FLUSH 100 UNIT/ML IV SOLN
500.0000 [IU] | Freq: Once | INTRAVENOUS | Status: DC | PRN
  Filled 2020-06-20: qty 5

## 2020-06-20 MED ORDER — DEXTROSE 5 % IV SOLN
Freq: Once | INTRAVENOUS | Status: AC
  Filled 2020-06-20: qty 250

## 2020-06-20 MED ORDER — SODIUM CHLORIDE 0.9 % IV SOLN
1920.0000 mg/m2 | INTRAVENOUS | Status: DC
  Administered 2020-06-20: 3300 mg via INTRAVENOUS
  Filled 2020-06-20: qty 66

## 2020-06-20 MED ORDER — OXALIPLATIN CHEMO INJECTION 100 MG/20ML
68.0000 mg/m2 | Freq: Once | INTRAVENOUS | Status: AC
  Administered 2020-06-20: 115 mg via INTRAVENOUS
  Filled 2020-06-20: qty 20

## 2020-06-20 MED ORDER — PALONOSETRON HCL INJECTION 0.25 MG/5ML
INTRAVENOUS | Status: AC
  Filled 2020-06-20: qty 5

## 2020-06-20 MED ORDER — FLUOROURACIL CHEMO INJECTION 2.5 GM/50ML
320.0000 mg/m2 | Freq: Once | INTRAVENOUS | Status: AC
  Administered 2020-06-20: 550 mg via INTRAVENOUS
  Filled 2020-06-20: qty 11

## 2020-06-20 MED ORDER — PALONOSETRON HCL INJECTION 0.25 MG/5ML
0.2500 mg | Freq: Once | INTRAVENOUS | Status: AC
  Administered 2020-06-20: 0.25 mg via INTRAVENOUS

## 2020-06-20 MED ORDER — LEUCOVORIN CALCIUM INJECTION 350 MG
400.0000 mg/m2 | Freq: Once | INTRAVENOUS | Status: AC
  Administered 2020-06-20: 684 mg via INTRAVENOUS
  Filled 2020-06-20: qty 34.2

## 2020-06-20 MED ORDER — SODIUM CHLORIDE 0.9 % IV SOLN
10.0000 mg | Freq: Once | INTRAVENOUS | Status: AC
  Administered 2020-06-20: 10 mg via INTRAVENOUS
  Filled 2020-06-20: qty 10

## 2020-06-20 NOTE — Progress Notes (Signed)
Pt. Accompanied by her daughter who is interpreting for her.

## 2020-06-20 NOTE — Telephone Encounter (Signed)
Appointments scheduled calendar printed per 9/29 los 

## 2020-06-20 NOTE — Patient Instructions (Signed)

## 2020-06-20 NOTE — Progress Notes (Signed)
Hematology and Oncology Follow Up Visit  Bailey Walker [id number] 20-Dec-1945 74 y.o. 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  We are now ready to start adjuvant chemotherapy.  Unfortunately, she fell over the weekend.  She did have a CT of the abdomen.  The radiologist commented that there was a 1.6 cm anterior abdominal wall nodule.  It is hard to really know what this is.  We will just have to watch this as we move along.  Of note, we checked her CEA level earlier in September.  It was normal at 2.8.  She has a bruise on her right knee.  She has little bit of decreased range of motion of the right knee.  I am sure x-rays were done.  She has a good appetite.  She saw the surgeon yesterday.  The abdominal wound is almost closed.  She has a abdominal wound device to help close the wound.  I do think we are now ready for treatment.  Overall, her performance status is ECOG 1.    Medications:  Current Outpatient Medications:  .  alendronate (FOSAMAX) 70 MG tablet, Take 70 mg by mouth every Sunday.  (Patient not taking: Reported on 05/29/2020), Disp: , Rfl:  .  aspirin EC 81 MG tablet, Take 81 mg by mouth daily. Swallow whole., Disp: , Rfl:  .  denosumab (PROLIA) 60 MG/ML SOSY injection, Inject 60 mg into the skin every 6 (six) months. (Patient not taking: Reported on 05/29/2020), Disp: , Rfl:  .  dexamethasone (DECADRON) 4 MG tablet, Take 2 tablets (8 mg total) by mouth daily. Start the day after chemotherapy for 2 days. Take with food., Disp: 30 tablet, Rfl: 1 .  DEXILANT 30 MG capsule, TAKE 1 CAPSULE BY MOUTH EVERY DAY (Patient taking differently: Take 30 mg by mouth daily. ), Disp: 30 capsule, Rfl: 5 .  docusate sodium (COLACE) 100 MG capsule, Take 1 capsule  (100 mg total) by mouth 2 (two) times daily., Disp: 10 capsule, Rfl: 0 .  feeding supplement, ENSURE ENLIVE, (ENSURE ENLIVE) LIQD, Take 237 mLs by mouth 3 (three) times daily between meals., Disp: 237 mL, Rfl: 12 .  glipiZIDE (GLUCOTROL) 5 MG tablet, TAKE 1 TABLET BY MOUTH TWICE A DAY BEFORE MEALS (Patient taking differently: Take 5 mg by mouth See admin instructions. Take one tablet (5 mg) by mouth twice daily - after lunch and at midnight), Disp: 60 tablet, Rfl: 3 .  levothyroxine (SYNTHROID) 75 MCG tablet, TAKE 1 TABLET BY MOUTH EVERY DAY (Patient taking differently: Take 75 mcg by mouth daily before breakfast. ), Disp: 30 tablet, Rfl: 0 .  lidocaine (LIDODERM) 5 %, Place 1 patch onto the skin daily. Remove & Discard patch within 12 hours or as directed by MD (Patient not taking: Reported on 05/29/2020), Disp: 15 patch, Rfl: 0 .  lidocaine-prilocaine (EMLA) cream, Apply to affected area once, Disp: 30 g, Rfl: 3 .  metFORMIN (GLUCOPHAGE) 500 MG tablet, Take 0.5 tablets (250 mg total) by mouth 2 (two) times daily with a meal. (Patient taking differently: Take 500 mg by mouth See admin instructions. Take one tablet (500 mg) by mouth twice daily - after lunch and at midnight), Disp: 90 tablet, Rfl: 3 .  methocarbamol (ROBAXIN)  500 MG tablet, Take 1 tablet (500 mg total) by mouth every 8 (eight) hours as needed for muscle spasms., Disp: 30 tablet, Rfl: 0 .  metoprolol tartrate (LOPRESSOR) 25 MG tablet, Take 1 tablet (25 mg total) by mouth 2 (two) times daily., Disp: 180 tablet, Rfl: 3 .  Multiple Vitamin (MULTIVITAMIN WITH MINERALS) TABS tablet, Take 1 tablet by mouth daily., Disp: 30 tablet, Rfl: 0 .  nitroGLYCERIN (NITROSTAT) 0.4 MG SL tablet, Place 1 tablet (0.4 mg total) under the tongue every 5 (five) minutes as needed for chest pain., Disp: 11 tablet, Rfl: 6 .  ondansetron (ZOFRAN) 4 MG tablet, Take 1 tablet (4 mg total) by mouth every 6 (six) hours as needed for nausea. (Patient not taking: Reported on  05/29/2020), Disp: 20 tablet, Rfl: 0 .  ondansetron (ZOFRAN) 8 MG tablet, Take 1 tablet (8 mg total) by mouth 2 (two) times daily as needed for refractory nausea / vomiting. Start on day 3 after chemotherapy., Disp: 30 tablet, Rfl: 1 .  prednisoLONE acetate (PRED FORTE) 1 % ophthalmic suspension, Place 1 drop into both eyes See admin instructions. Instill one drop into both eyes on Monday, Wednesday, Friday nights, Disp: , Rfl:  .  prochlorperazine (COMPAZINE) 10 MG tablet, Take 1 tablet (10 mg total) by mouth every 6 (six) hours as needed (Nausea or vomiting)., Disp: 30 tablet, Rfl: 1  Allergies:  Allergies  Allergen Reactions  . Nitroglycerin Nausea And Vomiting  . Ampicillin Other (See Comments)    Per allergy test    Past Medical History, Surgical history, Social history, and Family History were reviewed and updated.  Review of Systems: Review of Systems  Constitutional: Positive for appetite change and unexpected weight change.  Eyes: Negative.   Respiratory: Negative.   Cardiovascular: Negative.   Gastrointestinal: Positive for abdominal pain and nausea.  Endocrine: Negative.   Genitourinary: Negative.    Musculoskeletal: Negative.   Skin: Negative.   Neurological: Negative.   Hematological: Negative.   Psychiatric/Behavioral: Negative.     Physical Exam:  weight is 156 lb (70.8 kg). Her oral temperature is 98 F (36.7 C). Her blood pressure is 120/82 and her pulse is 89. Her respiration is 16 and oxygen saturation is 100%.   Wt Readings from Last 3 Encounters:  06/20/20 156 lb (70.8 kg)  06/17/20 150 lb (68 kg)  05/29/20 150 lb (68 kg)    Physical Exam Vitals reviewed.  HENT:     Head: Normocephalic and atraumatic.  Eyes:     Pupils: Pupils are equal, round, and reactive to light.  Cardiovascular:     Rate and Rhythm: Normal rate and regular rhythm.     Heart sounds: Normal heart sounds.  Pulmonary:     Effort: Pulmonary effort is normal.     Breath sounds:  Normal breath sounds.  Abdominal:     General: Bowel sounds are normal.     Palpations: Abdomen is soft.     Comments: Abdominal exam shows a soft abdomen.  She has the wound in the anterior abdominal wall.  There was a picture taken in the emergency room.  This was quite significant.  It was none healing over yet.  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  There is some slight tenderness to palpation of the abdomen.  There is no palpable liver or spleen tip.  Musculoskeletal:        General: No tenderness or deformity. Normal range of motion.     Cervical back:  Normal range of motion.  Lymphadenopathy:     Cervical: No cervical adenopathy.  Skin:    General: Skin is warm and dry.     Findings: No erythema or rash.  Neurological:     Mental Status: She is alert and oriented to person, place, and time.  Psychiatric:        Behavior: Behavior normal.        Thought Content: Thought content normal.        Judgment: Judgment normal.      Lab Results  Component Value Date   WBC 9.5 06/20/2020   HGB 11.4 (L) 06/20/2020   HCT 36.3 06/20/2020   MCV 90.5 06/20/2020   PLT 268 06/20/2020     Chemistry      Component Value Date/Time   NA 134 (L) 06/17/2020 1805   NA 139 07/12/2019 1110   K 4.1 06/17/2020 1805   CL 98 06/17/2020 1805   CO2 23 06/17/2020 1805   BUN 17 06/17/2020 1805   BUN 15 07/12/2019 1110   CREATININE 0.57 06/17/2020 1805   CREATININE 0.66 05/29/2020 1402   CREATININE 0.74 01/08/2017 1100      Component Value Date/Time   CALCIUM 9.2 06/17/2020 1805   ALKPHOS 45 05/29/2020 1402   AST 17 05/29/2020 1402   ALT 10 05/29/2020 1402   BILITOT 0.3 05/29/2020 1402      Impression and Plan: Bailey Walker is a very charming 74 year old woman.  She is [place].  She actually was living in [place].  [place]  I am happy that she is getting better.  I am happy that this wound is healing up.  Again, I just am not sure what is  going on with the CT scan and that anterior abdominal wall nodule.  I suppose it could be metastatic disease.  I would think, if that was the case, that we would see the CEA being elevated.  We will just have to watch this as we go along and do another CT scan on her probably in 2-3 months.  She does have a Port-A-Cath in place.  We will go ahead with her first cycle of treatment today.  We will plan to get her back in 2 more weeks.    Volanda Napoleon, MD 9/29/20219:33 AM

## 2020-06-21 LAB — FERRITIN: Ferritin: 86 ng/mL (ref 11–307)

## 2020-06-21 LAB — IRON AND TIBC
Iron: 42 ug/dL (ref 41–142)
Saturation Ratios: 15 % — ABNORMAL LOW (ref 21–57)
TIBC: 279 ug/dL (ref 236–444)
UIBC: 237 ug/dL (ref 120–384)

## 2020-06-21 LAB — CEA (IN HOUSE-CHCC): CEA (CHCC-In House): 2.48 ng/mL (ref 0.00–5.00)

## 2020-06-22 ENCOUNTER — Other Ambulatory Visit: Payer: Self-pay | Admitting: Hematology & Oncology

## 2020-06-22 ENCOUNTER — Inpatient Hospital Stay: Payer: Medicaid Other | Attending: Hematology and Oncology

## 2020-06-22 ENCOUNTER — Other Ambulatory Visit: Payer: Self-pay

## 2020-06-22 VITALS — BP 127/81 | HR 90 | Temp 98.0°F | Resp 18

## 2020-06-22 DIAGNOSIS — R109 Unspecified abdominal pain: Secondary | ICD-10-CM | POA: Diagnosis not present

## 2020-06-22 DIAGNOSIS — R110 Nausea: Secondary | ICD-10-CM | POA: Diagnosis not present

## 2020-06-22 DIAGNOSIS — C187 Malignant neoplasm of sigmoid colon: Secondary | ICD-10-CM | POA: Diagnosis present

## 2020-06-22 DIAGNOSIS — C779 Secondary and unspecified malignant neoplasm of lymph node, unspecified: Secondary | ICD-10-CM | POA: Insufficient documentation

## 2020-06-22 DIAGNOSIS — Z5111 Encounter for antineoplastic chemotherapy: Secondary | ICD-10-CM | POA: Diagnosis not present

## 2020-06-22 DIAGNOSIS — M792 Neuralgia and neuritis, unspecified: Secondary | ICD-10-CM | POA: Diagnosis not present

## 2020-06-22 DIAGNOSIS — R10819 Abdominal tenderness, unspecified site: Secondary | ICD-10-CM | POA: Diagnosis not present

## 2020-06-22 DIAGNOSIS — Z79899 Other long term (current) drug therapy: Secondary | ICD-10-CM | POA: Insufficient documentation

## 2020-06-22 DIAGNOSIS — D510 Vitamin B12 deficiency anemia due to intrinsic factor deficiency: Secondary | ICD-10-CM | POA: Diagnosis present

## 2020-06-22 MED ORDER — HEPARIN SOD (PORK) LOCK FLUSH 100 UNIT/ML IV SOLN
500.0000 [IU] | Freq: Once | INTRAVENOUS | Status: AC | PRN
  Administered 2020-06-22: 500 [IU]
  Filled 2020-06-22: qty 5

## 2020-06-22 MED ORDER — SODIUM CHLORIDE 0.9% FLUSH
10.0000 mL | INTRAVENOUS | Status: DC | PRN
  Administered 2020-06-22: 10 mL
  Filled 2020-06-22: qty 10

## 2020-06-22 NOTE — Patient Instructions (Signed)

## 2020-07-04 ENCOUNTER — Encounter: Payer: Self-pay | Admitting: Hematology & Oncology

## 2020-07-04 ENCOUNTER — Other Ambulatory Visit: Payer: Self-pay

## 2020-07-04 ENCOUNTER — Telehealth: Payer: Self-pay | Admitting: Hematology & Oncology

## 2020-07-04 ENCOUNTER — Inpatient Hospital Stay: Payer: Medicaid Other

## 2020-07-04 ENCOUNTER — Inpatient Hospital Stay (HOSPITAL_BASED_OUTPATIENT_CLINIC_OR_DEPARTMENT_OTHER): Payer: Medicaid Other | Admitting: Hematology & Oncology

## 2020-07-04 VITALS — Wt 152.0 lb

## 2020-07-04 DIAGNOSIS — C772 Secondary and unspecified malignant neoplasm of intra-abdominal lymph nodes: Secondary | ICD-10-CM

## 2020-07-04 DIAGNOSIS — Z5111 Encounter for antineoplastic chemotherapy: Secondary | ICD-10-CM | POA: Diagnosis not present

## 2020-07-04 DIAGNOSIS — C187 Malignant neoplasm of sigmoid colon: Secondary | ICD-10-CM

## 2020-07-04 LAB — CMP (CANCER CENTER ONLY)
ALT: 11 U/L (ref 0–44)
AST: 14 U/L — ABNORMAL LOW (ref 15–41)
Albumin: 4 g/dL (ref 3.5–5.0)
Alkaline Phosphatase: 40 U/L (ref 38–126)
Anion gap: 9 (ref 5–15)
BUN: 20 mg/dL (ref 8–23)
CO2: 26 mmol/L (ref 22–32)
Calcium: 9.6 mg/dL (ref 8.9–10.3)
Chloride: 103 mmol/L (ref 98–111)
Creatinine: 0.66 mg/dL (ref 0.44–1.00)
GFR, Estimated: >60 mL/min (ref >60)
Glucose, Bld: 122 mg/dL — ABNORMAL HIGH (ref 70–99)
Potassium: 4.1 mmol/L (ref 3.5–5.1)
Sodium: 138 mmol/L (ref 135–145)
Total Bilirubin: 0.2 mg/dL — ABNORMAL LOW (ref 0.3–1.2)
Total Protein: 6.9 g/dL (ref 6.5–8.1)

## 2020-07-04 LAB — CBC WITH DIFFERENTIAL (CANCER CENTER ONLY)
Abs Immature Granulocytes: 0.03 10*3/uL (ref 0.00–0.07)
Basophils Absolute: 0 10*3/uL (ref 0.0–0.1)
Basophils Relative: 1 %
Eosinophils Absolute: 0.2 10*3/uL (ref 0.0–0.5)
Eosinophils Relative: 3 %
HCT: 36.7 % (ref 36.0–46.0)
Hemoglobin: 11.7 g/dL — ABNORMAL LOW (ref 12.0–15.0)
Immature Granulocytes: 0 %
Lymphocytes Relative: 33 %
Lymphs Abs: 2.7 10*3/uL (ref 0.7–4.0)
MCH: 28.6 pg (ref 26.0–34.0)
MCHC: 31.9 g/dL (ref 30.0–36.0)
MCV: 89.7 fL (ref 80.0–100.0)
Monocytes Absolute: 0.8 10*3/uL (ref 0.1–1.0)
Monocytes Relative: 9 %
Neutro Abs: 4.4 10*3/uL (ref 1.7–7.7)
Neutrophils Relative %: 54 %
Platelet Count: 193 10*3/uL (ref 150–400)
RBC: 4.09 MIL/uL (ref 3.87–5.11)
RDW: 14.6 % (ref 11.5–15.5)
WBC Count: 8.1 10*3/uL (ref 4.0–10.5)
nRBC: 0 % (ref 0.0–0.2)

## 2020-07-04 LAB — IRON AND TIBC
Iron: 55 ug/dL (ref 41–142)
Saturation Ratios: 17 % — ABNORMAL LOW (ref 21–57)
TIBC: 335 ug/dL (ref 236–444)
UIBC: 280 ug/dL (ref 120–384)

## 2020-07-04 LAB — CEA (IN HOUSE-CHCC): CEA (CHCC-In House): 2.42 ng/mL (ref 0.00–5.00)

## 2020-07-04 LAB — FERRITIN: Ferritin: 96 ng/mL (ref 11–307)

## 2020-07-04 LAB — LACTATE DEHYDROGENASE: LDH: 151 U/L (ref 98–192)

## 2020-07-04 MED ORDER — FLUOROURACIL CHEMO INJECTION 2.5 GM/50ML
320.0000 mg/m2 | Freq: Once | INTRAVENOUS | Status: AC
  Administered 2020-07-04: 550 mg via INTRAVENOUS
  Filled 2020-07-04: qty 11

## 2020-07-04 MED ORDER — DEXTROSE 5 % IV SOLN
Freq: Once | INTRAVENOUS | Status: AC
  Filled 2020-07-04: qty 250

## 2020-07-04 MED ORDER — SODIUM CHLORIDE 0.9 % IV SOLN
10.0000 mg | Freq: Once | INTRAVENOUS | Status: AC
  Administered 2020-07-04: 10 mg via INTRAVENOUS
  Filled 2020-07-04: qty 10

## 2020-07-04 MED ORDER — SODIUM CHLORIDE 0.9% FLUSH
10.0000 mL | INTRAVENOUS | Status: DC | PRN
  Filled 2020-07-04: qty 10

## 2020-07-04 MED ORDER — PALONOSETRON HCL INJECTION 0.25 MG/5ML
INTRAVENOUS | Status: AC
  Filled 2020-07-04: qty 5

## 2020-07-04 MED ORDER — HEPARIN SOD (PORK) LOCK FLUSH 100 UNIT/ML IV SOLN
500.0000 [IU] | Freq: Once | INTRAVENOUS | Status: DC | PRN
  Filled 2020-07-04: qty 5

## 2020-07-04 MED ORDER — SODIUM CHLORIDE 0.9 % IV SOLN
1920.0000 mg/m2 | INTRAVENOUS | Status: DC
  Administered 2020-07-04: 3300 mg via INTRAVENOUS
  Filled 2020-07-04: qty 66

## 2020-07-04 MED ORDER — LEUCOVORIN CALCIUM INJECTION 350 MG
400.0000 mg/m2 | Freq: Once | INTRAVENOUS | Status: AC
  Administered 2020-07-04: 684 mg via INTRAVENOUS
  Filled 2020-07-04: qty 34.2

## 2020-07-04 MED ORDER — OXALIPLATIN CHEMO INJECTION 100 MG/20ML
68.0000 mg/m2 | Freq: Once | INTRAVENOUS | Status: AC
  Administered 2020-07-04: 115 mg via INTRAVENOUS
  Filled 2020-07-04: qty 20

## 2020-07-04 MED ORDER — PALONOSETRON HCL INJECTION 0.25 MG/5ML
0.2500 mg | Freq: Once | INTRAVENOUS | Status: AC
  Administered 2020-07-04: 0.25 mg via INTRAVENOUS

## 2020-07-04 NOTE — Telephone Encounter (Signed)
Appointments scheduled calendar printed per 10/13 los

## 2020-07-04 NOTE — Progress Notes (Signed)
Pt left ambulatory in no apparent distress.

## 2020-07-04 NOTE — Progress Notes (Signed)
Hematology and Oncology Follow Up Visit  Bailey Walker [id number] 01-01-1946 74 y.o. 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  She had a first cycle of adjuvant FOLFOX.  She actually tolerated this quite well.  She had no nausea or vomiting.  She had no issues with diarrhea.  She does have the colostomy.  She has had a little bit of discomfort in the right lower quadrant of the abdomen.  She says it is a burning type of pain.  There is no rash associated with this.    There is no cough or shortness of breath.  She has had no leg swelling.  She has had no fever.  There has been no bleeding.  Her last CEA in late September was 2.5.    The abdominal wound continues to heal.  Her daughter says that it is almost closed and now..  Overall, her performance status is ECOG 1.    Medications:  Current Outpatient Medications:  .  alendronate (FOSAMAX) 70 MG tablet, Take 70 mg by mouth every Sunday.  (Patient not taking: Reported on 05/29/2020), Disp: , Rfl:  .  aspirin EC 81 MG tablet, Take 81 mg by mouth daily. Swallow whole., Disp: , Rfl:  .  denosumab (PROLIA) 60 MG/ML SOSY injection, Inject 60 mg into the skin every 6 (six) months. (Patient not taking: Reported on 05/29/2020), Disp: , Rfl:  .  dexamethasone (DECADRON) 4 MG tablet, Take 2 tablets (8 mg total) by mouth daily. Start the day after chemotherapy for 2 days. Take with food., Disp: 30 tablet, Rfl: 1 .  DEXILANT 30 MG capsule, TAKE 1 CAPSULE BY MOUTH EVERY DAY (Patient taking differently: Take 30 mg by mouth daily. ), Disp: 30 capsule, Rfl: 5 .  docusate sodium (COLACE) 100 MG capsule, Take 1 capsule (100 mg total) by mouth 2 (two) times daily., Disp: 10  capsule, Rfl: 0 .  feeding supplement, ENSURE ENLIVE, (ENSURE ENLIVE) LIQD, Take 237 mLs by mouth 3 (three) times daily between meals., Disp: 237 mL, Rfl: 12 .  glipiZIDE (GLUCOTROL) 5 MG tablet, TAKE 1 TABLET BY MOUTH TWICE A DAY BEFORE MEALS (Patient taking differently: Take 5 mg by mouth See admin instructions. Take one tablet (5 mg) by mouth twice daily - after lunch and at midnight), Disp: 60 tablet, Rfl: 3 .  levothyroxine (SYNTHROID) 75 MCG tablet, TAKE 1 TABLET BY MOUTH EVERY DAY (Patient taking differently: Take 75 mcg by mouth daily before breakfast. ), Disp: 30 tablet, Rfl: 0 .  lidocaine (LIDODERM) 5 %, Place 1 patch onto the skin daily. Remove & Discard patch within 12 hours or as directed by MD (Patient not taking: Reported on 05/29/2020), Disp: 15 patch, Rfl: 0 .  lidocaine-prilocaine (EMLA) cream, Apply to affected area once, Disp: 30 g, Rfl: 3 .  metFORMIN (GLUCOPHAGE) 500 MG tablet, Take 0.5 tablets (250 mg total) by mouth 2 (two) times daily with a meal. (Patient taking differently: Take 500 mg by mouth See admin instructions. Take one tablet (500 mg) by mouth twice daily - after lunch and at midnight), Disp: 90 tablet, Rfl: 3 .  methocarbamol (ROBAXIN) 500 MG tablet, Take 1 tablet (500 mg  total) by mouth every 8 (eight) hours as needed for muscle spasms., Disp: 30 tablet, Rfl: 0 .  metoprolol tartrate (LOPRESSOR) 25 MG tablet, Take 1 tablet (25 mg total) by mouth 2 (two) times daily., Disp: 180 tablet, Rfl: 3 .  Multiple Vitamin (MULTIVITAMIN WITH MINERALS) TABS tablet, Take 1 tablet by mouth daily., Disp: 30 tablet, Rfl: 0 .  nitroGLYCERIN (NITROSTAT) 0.4 MG SL tablet, Place 1 tablet (0.4 mg total) under the tongue every 5 (five) minutes as needed for chest pain., Disp: 11 tablet, Rfl: 6 .  ondansetron (ZOFRAN) 4 MG tablet, Take 1 tablet (4 mg total) by mouth every 6 (six) hours as needed for nausea. (Patient not taking: Reported on 05/29/2020), Disp: 20 tablet, Rfl: 0 .  ondansetron  (ZOFRAN) 8 MG tablet, Take 1 tablet (8 mg total) by mouth 2 (two) times daily as needed for refractory nausea / vomiting. Start on day 3 after chemotherapy., Disp: 30 tablet, Rfl: 1 .  prednisoLONE acetate (PRED FORTE) 1 % ophthalmic suspension, Place 1 drop into both eyes See admin instructions. Instill one drop into both eyes on Monday, Wednesday, Friday nights, Disp: , Rfl:  .  prochlorperazine (COMPAZINE) 10 MG tablet, Take 1 tablet (10 mg total) by mouth every 6 (six) hours as needed (Nausea or vomiting)., Disp: 30 tablet, Rfl: 1  Allergies:  Allergies  Allergen Reactions  . Nitroglycerin Nausea And Vomiting  . Ampicillin Other (See Comments)    Per allergy test    Past Medical History, Surgical history, Social history, and Family History were reviewed and updated.  Review of Systems: Review of Systems  Constitutional: Positive for appetite change and unexpected weight change.  Eyes: Negative.   Respiratory: Negative.   Cardiovascular: Negative.   Gastrointestinal: Positive for abdominal pain and nausea.  Endocrine: Negative.   Genitourinary: Negative.    Musculoskeletal: Negative.   Skin: Negative.   Neurological: Negative.   Hematological: Negative.   Psychiatric/Behavioral: Negative.     Physical Exam:  weight is 152 lb (68.9 kg).   Wt Readings from Last 3 Encounters:  07/04/20 152 lb (68.9 kg)  07/04/20 152 lb 0.6 oz (69 kg)  06/20/20 156 lb (70.8 kg)    Physical Exam Vitals reviewed.  HENT:     Head: Normocephalic and atraumatic.  Eyes:     Pupils: Pupils are equal, round, and reactive to light.  Cardiovascular:     Rate and Rhythm: Normal rate and regular rhythm.     Heart sounds: Normal heart sounds.  Pulmonary:     Effort: Pulmonary effort is normal.     Breath sounds: Normal breath sounds.  Abdominal:     General: Bowel sounds are normal.     Palpations: Abdomen is soft.     Comments: Abdominal exam shows a soft abdomen.  She has the wound in the  anterior abdominal wall.  There was a picture taken in the emergency room.  This was quite significant.  It was none healing over yet.  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  There is some slight tenderness to palpation of the abdomen.  There is no palpable liver or spleen tip.  Musculoskeletal:        General: No tenderness or deformity. Normal range of motion.     Cervical back: Normal range of motion.  Lymphadenopathy:     Cervical: No cervical adenopathy.  Skin:    General: Skin is warm and dry.     Findings: No erythema or  rash.  Neurological:     Mental Status: She is alert and oriented to person, place, and time.  Psychiatric:        Behavior: Behavior normal.        Thought Content: Thought content normal.        Judgment: Judgment normal.      Lab Results  Component Value Date   WBC 8.1 07/04/2020   HGB 11.7 (L) 07/04/2020   HCT 36.7 07/04/2020   MCV 89.7 07/04/2020   PLT 193 07/04/2020     Chemistry      Component Value Date/Time   NA 138 07/04/2020 0828   NA 139 07/12/2019 1110   K 4.1 07/04/2020 0828   CL 103 07/04/2020 0828   CO2 26 07/04/2020 0828   BUN 20 07/04/2020 0828   BUN 15 07/12/2019 1110   CREATININE 0.66 07/04/2020 0828   CREATININE 0.74 01/08/2017 1100      Component Value Date/Time   CALCIUM 9.6 07/04/2020 0828   ALKPHOS 40 07/04/2020 0828   AST 14 (L) 07/04/2020 0828   ALT 11 07/04/2020 0828   BILITOT 0.2 (L) 07/04/2020 0828      Impression and Plan: Bailey Walker is a very charming 74 year old woman.  She is [place].  She actually was living in [place].  [place].  I am happy that she is tolerating the chemotherapy so far.  She really is quite tough.  She is a tiny lady but she has a lot of constitution.  We will go ahead with her second cycle of treatment.  We will probably plan for a CT scan after the fourth cycle.     Bailey Napoleon, MD 10/13/20218:55 AM

## 2020-07-04 NOTE — Patient Instructions (Signed)
[facility] °Discharge Instructions for Patients Receiving Chemotherapy ° °Today you received the following chemotherapy agents Oxaliplatin, Leucovorin and 5FU ° °To help prevent nausea and vomiting after your treatment, we encourage you to take your nausea medication as prescribed by MD. °**Do not take zofran for 3 days after chemotherapy** °  °If you develop nausea and vomiting that is not controlled by your nausea medication, call the clinic.  ° °BELOW ARE SYMPTOMS THAT SHOULD BE REPORTED IMMEDIATELY: °· *FEVER GREATER THAN 100.5 F °· *CHILLS WITH OR WITHOUT FEVER °· NAUSEA AND VOMITING THAT IS NOT CONTROLLED WITH YOUR NAUSEA MEDICATION °· *UNUSUAL SHORTNESS OF BREATH °· *UNUSUAL BRUISING OR BLEEDING °· TENDERNESS IN MOUTH AND THROAT WITH OR WITHOUT PRESENCE OF ULCERS °· *URINARY PROBLEMS °· *BOWEL PROBLEMS °· UNUSUAL RASH °Items with * indicate a potential emergency and should be followed up as soon as possible. ° °Feel free to call the clinic should you have any questions or concerns. The clinic phone number is [phone]. ° °Please show the CHEMO ALERT CARD at check-in to the Emergency Department and triage nurse. ° ° °

## 2020-07-04 NOTE — Progress Notes (Signed)
Pt. Accompanied by  Daughter who interprets for her.

## 2020-07-06 ENCOUNTER — Other Ambulatory Visit: Payer: Self-pay

## 2020-07-06 ENCOUNTER — Inpatient Hospital Stay: Payer: Medicaid Other

## 2020-07-06 VITALS — BP 112/60 | HR 64 | Temp 98.2°F | Resp 18

## 2020-07-06 DIAGNOSIS — C187 Malignant neoplasm of sigmoid colon: Secondary | ICD-10-CM

## 2020-07-06 DIAGNOSIS — C772 Secondary and unspecified malignant neoplasm of intra-abdominal lymph nodes: Secondary | ICD-10-CM

## 2020-07-06 DIAGNOSIS — Z5111 Encounter for antineoplastic chemotherapy: Secondary | ICD-10-CM | POA: Diagnosis not present

## 2020-07-06 MED ORDER — SODIUM CHLORIDE 0.9% FLUSH
10.0000 mL | INTRAVENOUS | Status: DC | PRN
  Administered 2020-07-06: 10 mL
  Filled 2020-07-06: qty 10

## 2020-07-06 MED ORDER — HEPARIN SOD (PORK) LOCK FLUSH 100 UNIT/ML IV SOLN
500.0000 [IU] | Freq: Once | INTRAVENOUS | Status: AC | PRN
  Administered 2020-07-06: 500 [IU]
  Filled 2020-07-06: qty 5

## 2020-07-06 NOTE — Patient Instructions (Signed)
Tunneled Central Venous Catheter Flushing Guide  It is important to flush your tunneled central venous catheter each time you use it, both before and after you use it. Flushing your catheter will help prevent it from clogging. What are the risks? Risks may include:  Infection.  Air getting into the catheter and bloodstream. Supplies needed:  A clean pair of gloves.  A disinfecting wipe. Use an alcohol wipe, chlorhexidine wipe, or iodine wipe as told by your health care provider.  A 10 mL syringe that has been prefilled with saline solution.  An empty 10 mL syringe, if a substance called heparin was injected into your catheter. How to flush your catheter When you flush your catheter, make sure you follow any specific instructions from your health care provider or the manufacturer. These are general guidelines. Flushing your catheter before use If there is heparin in your catheter: 1. Wash your hands with soap and water. 2. Put on gloves. 3. Scrub the injection cap for a minimum of 15 seconds with a disinfecting wipe. 4. Unclamp the catheter. 5. Attach the empty syringe to the injection cap. 6. Pull the syringe plunger back and withdraw 10 mL of blood. 7. Place the syringe into an appropriate waste container. 8. Scrub the injection cap for 15 seconds with a disinfecting wipe. 9. Attach the prefilled syringe to the injection cap. 10. Flush the catheter by pushing the plunger forward until all the liquid from the syringe is in the catheter. 11. Remove the syringe from the injection cap. 12. Clamp the catheter. If there is no heparin in your catheter: 1. Wash your hands with soap and water. 2. Put on gloves. 3. Scrub the injection cap for 15 seconds with a disinfecting wipe. 4. Unclamp the catheter. 5. Attach the prefilled syringe to the injection cap. 6. Flush the catheter by pushing the plunger forward until 5 mL of the liquid from the syringe is in the catheter. 7. Pull back on  the syringe until you see blood in the catheter. 8. If you have been asked to collect any blood, follow your health care provider's instructions. Otherwise, flush the catheter with the rest of the solution from the syringe. 9. Remove the syringe from the injection cap. 10. Clamp the catheter.  Flushing your catheter after use 1. Wash your hands with soap and water. 2. Put on gloves. 3. Scrub the injection cap for 15 seconds with a disinfecting wipe. 4. Unclamp the catheter. 5. Attach the prefilled syringe to the injection cap. 6. Flush the catheter by pushing the plunger forward until all of the liquid from the syringe is in the catheter. 7. Remove the syringe from the injection cap. 8. Clamp the catheter. Problems and solutions  If blood cannot be completely cleared from the injection cap, you may need to have the injection cap replaced.  If the catheter is difficult to flush, use the pulsing method. The pulsing method involves pushing only a few milliliters of solution into the catheter at a time and pausing between pushes.  If you do not see blood in the catheter when you pull back on the syringe, change your body position, such as by raising your arms above your head. Take a deep breath and cough. Then, pull back on the syringe. If you still do not see blood, flush the catheter with a small amount of solution. Then, change positions again and take a breath or cough. Pull back on the syringe again. If you still do not see   blood, finish flushing the catheter and contact your health care provider. Do not use your catheter until your health care provider says it is okay. General tips  Have someone help you flush your catheter, if possible.  Do not force fluid through your catheter.  Do not use a syringe that is larger or smaller than 10 mL. Using a smaller syringe can make the catheter burst.  Do not use your catheter without flushing it first if it has heparin in it. Contact a health  care provider if:  You cannot see any blood in the catheter when you flush it before using it.  Your catheter is difficult to flush. Get help right away if:  You cannot flush the catheter.  The catheter leaks when you flush it or when there is fluid in it.  There are cracks or breaks in the catheter. Summary  It is important to flush your tunneled central venous catheter each time you use it, both before and after you use it.  Scrub the injection cap for 15 seconds with a disinfecting wipe before and after you flush it.  When you flush your catheter, make sure you follow any specific instructions from your health care provider or the manufacturer.  Get help right away if you cannot flush the catheter. This information is not intended to replace advice given to you by your health care provider. Make sure you discuss any questions you have with your health care provider. Document Revised: 06/03/2019 Document Reviewed: 11/24/2018 Elsevier Patient Education  2020 [employer]  

## 2020-07-09 ENCOUNTER — Telehealth: Payer: Self-pay | Admitting: *Deleted

## 2020-07-09 ENCOUNTER — Other Ambulatory Visit: Payer: Self-pay | Admitting: Family

## 2020-07-09 ENCOUNTER — Ambulatory Visit: Payer: Medicaid Other | Admitting: Podiatry

## 2020-07-09 NOTE — Telephone Encounter (Signed)
Returned Sports coach (niece) phone call regarding increased back pain. Bailey Walker stated,"her pain started three days ago, but today its the worse. She is crying. I gave her Tylenol 500 mg tablets, two of them, and also placed a heating pad on her back. Even after that, she is still crying. She fell three weeks ago, 06/17/20, and the ER did scans and said everything was fine.  I informed her that Dr. Marin Olp is out of the office all week. Per Laverna Peace, NP, she should call Dr. Cristal Generous office, who did the colon surgery and placed the wound VAC to make sure there is nothing wrong with that, or go to the ER and be assessed. She verbalized understanding.

## 2020-07-09 NOTE — Telephone Encounter (Signed)
This nurse is returning [place] niece, phone call regarding increased back pain. Left a message for them to call the office back.

## 2020-07-18 ENCOUNTER — Encounter: Payer: Self-pay | Admitting: Family

## 2020-07-18 ENCOUNTER — Inpatient Hospital Stay: Payer: Medicaid Other

## 2020-07-18 ENCOUNTER — Inpatient Hospital Stay (HOSPITAL_BASED_OUTPATIENT_CLINIC_OR_DEPARTMENT_OTHER): Payer: Medicaid Other | Admitting: Family

## 2020-07-18 ENCOUNTER — Other Ambulatory Visit: Payer: Self-pay

## 2020-07-18 VITALS — BP 139/83 | HR 88 | Temp 97.7°F | Resp 18 | Ht 60.0 in | Wt 151.0 lb

## 2020-07-18 DIAGNOSIS — C187 Malignant neoplasm of sigmoid colon: Secondary | ICD-10-CM

## 2020-07-18 DIAGNOSIS — C772 Secondary and unspecified malignant neoplasm of intra-abdominal lymph nodes: Secondary | ICD-10-CM

## 2020-07-18 DIAGNOSIS — Z5111 Encounter for antineoplastic chemotherapy: Secondary | ICD-10-CM | POA: Diagnosis not present

## 2020-07-18 LAB — CBC WITH DIFFERENTIAL (CANCER CENTER ONLY)
Abs Immature Granulocytes: 0.03 10*3/uL (ref 0.00–0.07)
Basophils Absolute: 0 10*3/uL (ref 0.0–0.1)
Basophils Relative: 0 %
Eosinophils Absolute: 0.1 10*3/uL (ref 0.0–0.5)
Eosinophils Relative: 1 %
HCT: 37.6 % (ref 36.0–46.0)
Hemoglobin: 12 g/dL (ref 12.0–15.0)
Immature Granulocytes: 0 %
Lymphocytes Relative: 23 %
Lymphs Abs: 2.1 10*3/uL (ref 0.7–4.0)
MCH: 28.8 pg (ref 26.0–34.0)
MCHC: 31.9 g/dL (ref 30.0–36.0)
MCV: 90.4 fL (ref 80.0–100.0)
Monocytes Absolute: 1 10*3/uL (ref 0.1–1.0)
Monocytes Relative: 11 %
Neutro Abs: 5.7 10*3/uL (ref 1.7–7.7)
Neutrophils Relative %: 65 %
Platelet Count: 189 10*3/uL (ref 150–400)
RBC: 4.16 MIL/uL (ref 3.87–5.11)
RDW: 16.1 % — ABNORMAL HIGH (ref 11.5–15.5)
WBC Count: 8.9 10*3/uL (ref 4.0–10.5)
nRBC: 0 % (ref 0.0–0.2)

## 2020-07-18 LAB — CMP (CANCER CENTER ONLY)
ALT: 12 U/L (ref 0–44)
AST: 14 U/L — ABNORMAL LOW (ref 15–41)
Albumin: 3.8 g/dL (ref 3.5–5.0)
Alkaline Phosphatase: 41 U/L (ref 38–126)
Anion gap: 11 (ref 5–15)
BUN: 23 mg/dL (ref 8–23)
CO2: 25 mmol/L (ref 22–32)
Calcium: 9.8 mg/dL (ref 8.9–10.3)
Chloride: 99 mmol/L (ref 98–111)
Creatinine: 0.66 mg/dL (ref 0.44–1.00)
GFR, Estimated: >60 mL/min (ref >60)
Glucose, Bld: 180 mg/dL — ABNORMAL HIGH (ref 70–99)
Potassium: 3.9 mmol/L (ref 3.5–5.1)
Sodium: 135 mmol/L (ref 135–145)
Total Bilirubin: 0.3 mg/dL (ref 0.3–1.2)
Total Protein: 7.1 g/dL (ref 6.5–8.1)

## 2020-07-18 LAB — LACTATE DEHYDROGENASE: LDH: 152 U/L (ref 98–192)

## 2020-07-18 MED ORDER — HEPARIN SOD (PORK) LOCK FLUSH 100 UNIT/ML IV SOLN
500.0000 [IU] | Freq: Once | INTRAVENOUS | Status: DC | PRN
  Filled 2020-07-18: qty 5

## 2020-07-18 MED ORDER — METHOCARBAMOL 500 MG PO TABS: 500.0000 mg | ORAL_TABLET | Freq: Three times a day (TID) | ORAL | 0 refills | Status: DC | PRN

## 2020-07-18 MED ORDER — LEUCOVORIN CALCIUM INJECTION 350 MG
400.0000 mg/m2 | Freq: Once | INTRAVENOUS | Status: AC
  Administered 2020-07-18: 684 mg via INTRAVENOUS
  Filled 2020-07-18: qty 34.2

## 2020-07-18 MED ORDER — FLUOROURACIL CHEMO INJECTION 2.5 GM/50ML
320.0000 mg/m2 | Freq: Once | INTRAVENOUS | Status: AC
  Administered 2020-07-18: 550 mg via INTRAVENOUS
  Filled 2020-07-18: qty 11

## 2020-07-18 MED ORDER — PALONOSETRON HCL INJECTION 0.25 MG/5ML
INTRAVENOUS | Status: AC
  Filled 2020-07-18: qty 5

## 2020-07-18 MED ORDER — OXALIPLATIN CHEMO INJECTION 100 MG/20ML
68.0000 mg/m2 | Freq: Once | INTRAVENOUS | Status: AC
  Administered 2020-07-18: 115 mg via INTRAVENOUS
  Filled 2020-07-18: qty 20

## 2020-07-18 MED ORDER — SODIUM CHLORIDE 0.9 % IV SOLN
1920.0000 mg/m2 | INTRAVENOUS | Status: DC
  Administered 2020-07-18: 3300 mg via INTRAVENOUS
  Filled 2020-07-18: qty 66

## 2020-07-18 MED ORDER — DEXTROSE 5 % IV SOLN
Freq: Once | INTRAVENOUS | Status: AC
  Filled 2020-07-18: qty 250

## 2020-07-18 MED ORDER — PALONOSETRON HCL INJECTION 0.25 MG/5ML
0.2500 mg | Freq: Once | INTRAVENOUS | Status: AC
  Administered 2020-07-18: 0.25 mg via INTRAVENOUS

## 2020-07-18 MED ORDER — SODIUM CHLORIDE 0.9% FLUSH
10.0000 mL | INTRAVENOUS | Status: DC | PRN
  Filled 2020-07-18: qty 10

## 2020-07-18 MED ORDER — SODIUM CHLORIDE 0.9 % IV SOLN
10.0000 mg | Freq: Once | INTRAVENOUS | Status: AC
  Administered 2020-07-18: 10 mg via INTRAVENOUS
  Filled 2020-07-18: qty 10

## 2020-07-18 NOTE — Progress Notes (Signed)
Hematology and Oncology Follow Up Visit  Bailey Walker [id number] 06/20/1946 74 y.o. 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  CEA several weeks ago was 2.42.  She still has some abdominal tenderness and burning nerve pain from the incision. Her daughter states that she saw her surgeon last week and let them know about the tenderness and burning pain. She states that she got a good report and her wound continues to heal nicely. We did remove the bandaging and there is no redness or edema to indicate infection noted.  No fever, chills, n/v, cough, rash, dizziness, chest pain, palpitations, abdominal pain or changes in bowel or bladder habits.  Her ostomy is functioning appropriately.  No episodes of bleeding. No abnormal bruising or petechiae.  She has chronic SOB with exertion secondary to past history of smoking for over 40 years. She quit in 2013.  No swelling, tenderness, numbness or tingling in her extremities.  No falls or syncopal episodes to report.  She is eating well and hydrating properly at home. Her weight is stable at 151 lbs.   ECOG Performance Status: 1 - Symptomatic but completely ambulatory  Medications:  Allergies as of 07/18/2020      Reactions   Nitroglycerin Nausea And Vomiting   Ampicillin Other (See Comments)   Per allergy test      Medication List       Accurate as of July 18, 2020 10:42 AM. If you have any questions, ask your nurse or doctor.        alendronate 70 MG tablet Commonly known as: FOSAMAX Take 70 mg by mouth every Sunday.   aspirin EC 81 MG tablet Take 81 mg by mouth daily. Swallow whole.   denosumab 60 MG/ML  Sosy injection Commonly known as: PROLIA Inject 60 mg into the skin every 6 (six) months.   dexamethasone 4 MG tablet Commonly known as: DECADRON Take 2 tablets (8 mg total) by mouth daily. Start the day after chemotherapy for 2 days. Take with food.   Dexilant 30 MG capsule Generic drug: Dexlansoprazole TAKE 1 CAPSULE BY MOUTH EVERY DAY What changed: how much to take   diclofenac Sodium 1 % Gel Commonly known as: VOLTAREN Apply topically.   docusate sodium 100 MG capsule Commonly known as: COLACE Take 1 capsule (100 mg total) by mouth 2 (two) times daily.   feeding supplement Liqd Take 237 mLs by mouth 3 (three) times daily between meals.   gabapentin 100 MG capsule Commonly known as: NEURONTIN Take 100 mg by mouth 3 (three) times daily.   glipiZIDE 5 MG tablet Commonly known as: GLUCOTROL TAKE 1 TABLET BY MOUTH TWICE A DAY BEFORE MEALS What changed: See the new instructions.   levothyroxine 75 MCG tablet Commonly known as: SYNTHROID TAKE 1 TABLET BY MOUTH EVERY DAY What changed: when to take this   lidocaine 5 % Commonly known as: LIDODERM Place 1 patch onto the skin daily. Remove & Discard patch within 12 hours or as directed by MD   lidocaine-prilocaine cream Commonly known as: EMLA Apply to affected area once   metFORMIN 500 MG tablet Commonly known as: GLUCOPHAGE Take 0.5 tablets (250  mg total) by mouth 2 (two) times daily with a meal. What changed:   how much to take  when to take this  additional instructions   methocarbamol 500 MG tablet Commonly known as: ROBAXIN Take 1 tablet (500 mg total) by mouth every 8 (eight) hours as needed for muscle spasms.   metoprolol tartrate 25 MG tablet Commonly known as: LOPRESSOR Take 1 tablet (25 mg total) by mouth 2 (two) times daily.   multivitamin with minerals Tabs tablet Take 1 tablet by mouth daily.   nitroGLYCERIN 0.4 MG SL tablet Commonly known as: NITROSTAT Place 1 tablet (0.4 mg total) under  the tongue every 5 (five) minutes as needed for chest pain.   nystatin powder Commonly known as: MYCOSTATIN/NYSTOP Apply topically.   ondansetron 4 MG tablet Commonly known as: ZOFRAN Take 1 tablet (4 mg total) by mouth every 6 (six) hours as needed for nausea.   ondansetron 8 MG tablet Commonly known as: Zofran Take 1 tablet (8 mg total) by mouth 2 (two) times daily as needed for refractory nausea / vomiting. Start on day 3 after chemotherapy.   prednisoLONE acetate 1 % ophthalmic suspension Commonly known as: PRED FORTE Place 1 drop into both eyes See admin instructions. Instill one drop into both eyes on Monday, Wednesday, Friday nights   prochlorperazine 10 MG tablet Commonly known as: COMPAZINE Take 1 tablet (10 mg total) by mouth every 6 (six) hours as needed (Nausea or vomiting).   traMADol 50 MG tablet Commonly known as: ULTRAM Take 50 mg by mouth every 6 (six) hours as needed.       Allergies:  Allergies  Allergen Reactions  . Nitroglycerin Nausea And Vomiting  . Ampicillin Other (See Comments)    Per allergy test    Past Medical History, Surgical history, Social history, and Family History were reviewed and updated.  Review of Systems: All other 10 point review of systems is negative.   Physical Exam:  vitals were not taken for this visit.   Wt Readings from Last 3 Encounters:  07/04/20 152 lb (68.9 kg)  07/04/20 152 lb 0.6 oz (69 kg)  06/20/20 156 lb (70.8 kg)    Ocular: Sclerae unicteric, pupils equal, round and reactive to light Ear-nose-throat: Oropharynx clear, dentition fair Lymphatic: No cervical or supraclavicular adenopathy Lungs no rales or rhonchi, good excursion bilaterally Heart regular rate and rhythm, no murmur appreciated Abd soft, nontender, positive bowel sounds, no liver or spleen tip palpated on exam, no fluid wave  MSK no focal spinal tenderness, no joint edema Neuro: non-focal, well-oriented, appropriate affect Breasts:  Deferred   Lab Results  Component Value Date   WBC 8.1 07/04/2020   HGB 11.7 (L) 07/04/2020   HCT 36.7 07/04/2020   MCV 89.7 07/04/2020   PLT 193 07/04/2020   Lab Results  Component Value Date   FERRITIN 96 07/04/2020   IRON 55 07/04/2020   TIBC 335 07/04/2020   UIBC 280 07/04/2020   IRONPCTSAT 17 (L) 07/04/2020   Lab Results  Component Value Date   RETICCTPCT 1.9 01/05/2020   RBC 4.09 07/04/2020   No results found for: KPAFRELGTCHN, LAMBDASER, KAPLAMBRATIO No results found for: IGGSERUM, IGA, IGMSERUM No results found for: Ronnald Ramp, A1GS, A2GS, Violet Baldy, MSPIKE, SPEI   Chemistry      Component Value Date/Time   NA 138 07/04/2020 0828   NA 139 07/12/2019 1110   K 4.1 07/04/2020 0828   CL 103 07/04/2020 0828   CO2 26  07/04/2020 0828   BUN 20 07/04/2020 0828   BUN 15 07/12/2019 1110   CREATININE 0.66 07/04/2020 0828   CREATININE 0.74 01/08/2017 1100      Component Value Date/Time   CALCIUM 9.6 07/04/2020 0828   ALKPHOS 40 07/04/2020 0828   AST 14 (L) 07/04/2020 0828   ALT 11 07/04/2020 0828   BILITOT 0.2 (L) 07/04/2020 0828       Impression and Plan: Ms. 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  She is tolerating treatment nicely so far.  We will proceed with cycle 3 today as planned.  We will repeat a CT scan after cycle 4.  I did refill her Robaxin per her request as this has help with the burning and occasional spasm pain along her abdominal incisions.  Follow-up in 2 weeks.  They were encouraged to contact our office with any questions or concerns.   Laverna Peace, NP 10/27/202110:42 AM

## 2020-07-18 NOTE — Patient Instructions (Signed)

## 2020-07-18 NOTE — Progress Notes (Signed)
Pt discharged in no apparent distress. Pt left ambulatory without assistance. Pt aware of discharge instructions and verbalized understanding and had no further questions.  

## 2020-07-18 NOTE — Patient Instructions (Signed)
[facility] Discharge Instructions for Patients Receiving Chemotherapy  Today you received the following chemotherapy agents Oxaliplatin, Leucovorin and 5FU  To help prevent nausea and vomiting after your treatment, we encourage you to take your nausea medication as prescribed by MD. **Do not take zofran for 3 days after chemotherapy**   If you develop nausea and vomiting that is not controlled by your nausea medication, call the clinic.   BELOW ARE SYMPTOMS THAT SHOULD BE REPORTED IMMEDIATELY:  *FEVER GREATER THAN 100.5 F  *CHILLS WITH OR WITHOUT FEVER  NAUSEA AND VOMITING THAT IS NOT CONTROLLED WITH YOUR NAUSEA MEDICATION  *UNUSUAL SHORTNESS OF BREATH  *UNUSUAL BRUISING OR BLEEDING  TENDERNESS IN MOUTH AND THROAT WITH OR WITHOUT PRESENCE OF ULCERS  *URINARY PROBLEMS  *BOWEL PROBLEMS  UNUSUAL RASH Items with * indicate a potential emergency and should be followed up as soon as possible.  Feel free to call the clinic should you have any questions or concerns. The clinic phone number is [phone].  Please show the [place] at check-in to the Emergency Department and triage nurse.

## 2020-07-19 LAB — CEA (IN HOUSE-CHCC): CEA (CHCC-In House): 2.65 ng/mL (ref 0.00–5.00)

## 2020-07-20 ENCOUNTER — Inpatient Hospital Stay: Payer: Medicaid Other

## 2020-07-20 ENCOUNTER — Other Ambulatory Visit: Payer: Self-pay

## 2020-07-20 VITALS — BP 121/60 | HR 70 | Temp 98.4°F | Resp 18

## 2020-07-20 DIAGNOSIS — C772 Secondary and unspecified malignant neoplasm of intra-abdominal lymph nodes: Secondary | ICD-10-CM

## 2020-07-20 DIAGNOSIS — C187 Malignant neoplasm of sigmoid colon: Secondary | ICD-10-CM

## 2020-07-20 DIAGNOSIS — Z5111 Encounter for antineoplastic chemotherapy: Secondary | ICD-10-CM | POA: Diagnosis not present

## 2020-07-20 MED ORDER — HEPARIN SOD (PORK) LOCK FLUSH 100 UNIT/ML IV SOLN
500.0000 [IU] | Freq: Once | INTRAVENOUS | Status: AC | PRN
  Administered 2020-07-20: 500 [IU]
  Filled 2020-07-20: qty 5

## 2020-07-20 MED ORDER — SODIUM CHLORIDE 0.9% FLUSH
10.0000 mL | INTRAVENOUS | Status: DC | PRN
  Administered 2020-07-20: 10 mL
  Filled 2020-07-20: qty 10

## 2020-07-20 NOTE — Progress Notes (Signed)
Pt. Accompanied by daughter who interpreted for her. Pt. was stable and asymptomatic at discharge.

## 2020-08-01 ENCOUNTER — Encounter: Payer: Self-pay | Admitting: Family

## 2020-08-01 ENCOUNTER — Inpatient Hospital Stay: Payer: Medicaid Other | Attending: Hematology and Oncology

## 2020-08-01 ENCOUNTER — Inpatient Hospital Stay (HOSPITAL_BASED_OUTPATIENT_CLINIC_OR_DEPARTMENT_OTHER): Payer: Medicaid Other | Admitting: Family

## 2020-08-01 ENCOUNTER — Inpatient Hospital Stay: Payer: Medicaid Other

## 2020-08-01 ENCOUNTER — Other Ambulatory Visit: Payer: Self-pay

## 2020-08-01 DIAGNOSIS — Z5111 Encounter for antineoplastic chemotherapy: Secondary | ICD-10-CM | POA: Diagnosis present

## 2020-08-01 DIAGNOSIS — C772 Secondary and unspecified malignant neoplasm of intra-abdominal lymph nodes: Secondary | ICD-10-CM

## 2020-08-01 DIAGNOSIS — C187 Malignant neoplasm of sigmoid colon: Secondary | ICD-10-CM | POA: Insufficient documentation

## 2020-08-01 DIAGNOSIS — Z7984 Long term (current) use of oral hypoglycemic drugs: Secondary | ICD-10-CM | POA: Insufficient documentation

## 2020-08-01 DIAGNOSIS — Z79899 Other long term (current) drug therapy: Secondary | ICD-10-CM | POA: Insufficient documentation

## 2020-08-01 DIAGNOSIS — Z7982 Long term (current) use of aspirin: Secondary | ICD-10-CM | POA: Insufficient documentation

## 2020-08-01 DIAGNOSIS — D510 Vitamin B12 deficiency anemia due to intrinsic factor deficiency: Secondary | ICD-10-CM | POA: Diagnosis present

## 2020-08-01 LAB — CEA (IN HOUSE-CHCC): CEA (CHCC-In House): 3.06 ng/mL (ref 0.00–5.00)

## 2020-08-01 LAB — CBC WITH DIFFERENTIAL (CANCER CENTER ONLY)
Abs Immature Granulocytes: 0.05 10*3/uL (ref 0.00–0.07)
Basophils Absolute: 0 10*3/uL (ref 0.0–0.1)
Basophils Relative: 1 %
Eosinophils Absolute: 0.1 10*3/uL (ref 0.0–0.5)
Eosinophils Relative: 1 %
HCT: 37.7 % (ref 36.0–46.0)
Hemoglobin: 12.1 g/dL (ref 12.0–15.0)
Immature Granulocytes: 1 %
Lymphocytes Relative: 34 %
Lymphs Abs: 2.2 10*3/uL (ref 0.7–4.0)
MCH: 29.4 pg (ref 26.0–34.0)
MCHC: 32.1 g/dL (ref 30.0–36.0)
MCV: 91.5 fL (ref 80.0–100.0)
Monocytes Absolute: 0.8 10*3/uL (ref 0.1–1.0)
Monocytes Relative: 12 %
Neutro Abs: 3.5 10*3/uL (ref 1.7–7.7)
Neutrophils Relative %: 51 %
Platelet Count: 162 10*3/uL (ref 150–400)
RBC: 4.12 MIL/uL (ref 3.87–5.11)
RDW: 18.1 % — ABNORMAL HIGH (ref 11.5–15.5)
WBC Count: 6.7 10*3/uL (ref 4.0–10.5)
nRBC: 0 % (ref 0.0–0.2)

## 2020-08-01 LAB — CMP (CANCER CENTER ONLY)
ALT: 14 U/L (ref 0–44)
AST: 15 U/L (ref 15–41)
Albumin: 3.9 g/dL (ref 3.5–5.0)
Alkaline Phosphatase: 38 U/L (ref 38–126)
Anion gap: 9 (ref 5–15)
BUN: 23 mg/dL (ref 8–23)
CO2: 26 mmol/L (ref 22–32)
Calcium: 9.7 mg/dL (ref 8.9–10.3)
Chloride: 101 mmol/L (ref 98–111)
Creatinine: 0.67 mg/dL (ref 0.44–1.00)
GFR, Estimated: >60 mL/min (ref >60)
Glucose, Bld: 206 mg/dL — ABNORMAL HIGH (ref 70–99)
Potassium: 3.7 mmol/L (ref 3.5–5.1)
Sodium: 136 mmol/L (ref 135–145)
Total Bilirubin: 0.3 mg/dL (ref 0.3–1.2)
Total Protein: 6.8 g/dL (ref 6.5–8.1)

## 2020-08-01 LAB — LACTATE DEHYDROGENASE: LDH: 155 U/L (ref 98–192)

## 2020-08-01 MED ORDER — OXALIPLATIN CHEMO INJECTION 100 MG/20ML
68.0000 mg/m2 | Freq: Once | INTRAVENOUS | Status: AC
  Administered 2020-08-01: 115 mg via INTRAVENOUS
  Filled 2020-08-01: qty 20

## 2020-08-01 MED ORDER — FLUOROURACIL CHEMO INJECTION 2.5 GM/50ML
320.0000 mg/m2 | Freq: Once | INTRAVENOUS | Status: AC
  Administered 2020-08-01: 550 mg via INTRAVENOUS
  Filled 2020-08-01: qty 11

## 2020-08-01 MED ORDER — DEXTROSE 5 % IV SOLN
Freq: Once | INTRAVENOUS | Status: DC
  Filled 2020-08-01: qty 250

## 2020-08-01 MED ORDER — METHOCARBAMOL 500 MG PO TABS: 500.0000 mg | ORAL_TABLET | Freq: Two times a day (BID) | ORAL | 2 refills | Status: DC | PRN

## 2020-08-01 MED ORDER — PALONOSETRON HCL INJECTION 0.25 MG/5ML
INTRAVENOUS | Status: AC
  Filled 2020-08-01: qty 5

## 2020-08-01 MED ORDER — DEXTROSE 5 % IV SOLN
Freq: Once | INTRAVENOUS | Status: AC
  Filled 2020-08-01: qty 250

## 2020-08-01 MED ORDER — SODIUM CHLORIDE 0.9 % IV SOLN
1920.0000 mg/m2 | INTRAVENOUS | Status: DC
  Administered 2020-08-01: 3300 mg via INTRAVENOUS
  Filled 2020-08-01: qty 66

## 2020-08-01 MED ORDER — SODIUM CHLORIDE 0.9 % IV SOLN
10.0000 mg | Freq: Once | INTRAVENOUS | Status: AC
  Administered 2020-08-01: 10 mg via INTRAVENOUS
  Filled 2020-08-01: qty 10

## 2020-08-01 MED ORDER — LEUCOVORIN CALCIUM INJECTION 350 MG
400.0000 mg/m2 | Freq: Once | INTRAVENOUS | Status: AC
  Administered 2020-08-01: 684 mg via INTRAVENOUS
  Filled 2020-08-01: qty 34.2

## 2020-08-01 MED ORDER — PALONOSETRON HCL INJECTION 0.25 MG/5ML
0.2500 mg | Freq: Once | INTRAVENOUS | Status: AC
  Administered 2020-08-01: 0.25 mg via INTRAVENOUS

## 2020-08-01 NOTE — Progress Notes (Signed)
Hematology and Oncology Follow Up Visit  Bailey Walker [id number] 07-Mar-1946 74 y.o. 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 She is doing well but does note some tenderness around her wound vac. No redness or edema on exam.  Ostomy stoma intact and functioning appropriately. She follows up with her surgeon again on 11/18.  No episodes of bleeding. No abnormal bruising, no petechiae.  She denies fever, chills, n/v, rash, dizziness, chest pain, palpitations, abdominal pain or changes in bowel or bladder habits.  CEA was 2.65 last month.  She has had a dry cough and congestion this morning with tightness in her chest. Lung sounds are clear throughout.  No swelling, tenderness, numbness or tingling in her extremities at this time.  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  No falls or syncopal episodes to report.  She has a good appetite and is staying well hydrated. Her weight is stable at 152 lbs.   ECOG Performance Status: 1 - Symptomatic but completely ambulatory  Medications:  Allergies as of 08/01/2020      Reactions   Nitroglycerin Nausea And Vomiting   Ampicillin Other (See Comments)   Per allergy test      Medication List       Accurate as of August 01, 2020  9:07 AM. If you have any questions, ask your nurse or doctor.        alendronate 70 MG tablet Commonly known as: FOSAMAX Take 70 mg by mouth every Sunday.   aspirin EC 81 MG tablet Take 81 mg by mouth daily. Swallow whole.   denosumab 60 MG/ML Sosy injection Commonly known as: PROLIA Inject 60 mg into the skin every 6 (six) months.   dexamethasone 4 MG  tablet Commonly known as: DECADRON Take 2 tablets (8 mg total) by mouth daily. Start the day after chemotherapy for 2 days. Take with food.   Dexilant 30 MG capsule Generic drug: Dexlansoprazole TAKE 1 CAPSULE BY MOUTH EVERY DAY What changed: how much to take   diclofenac Sodium 1 % Gel Commonly known as: VOLTAREN Apply topically.   docusate sodium 100 MG capsule Commonly known as: COLACE Take 1 capsule (100 mg total) by mouth 2 (two) times daily.   feeding supplement Liqd Take 237 mLs by mouth 3 (three) times daily between meals.   gabapentin 100 MG capsule Commonly known as: NEURONTIN Take 100 mg by mouth 3 (three) times daily.   glipiZIDE 5 MG tablet Commonly known as: GLUCOTROL TAKE 1 TABLET BY MOUTH TWICE A DAY BEFORE MEALS What changed: See the new instructions.   levothyroxine 75 MCG tablet Commonly known as: SYNTHROID TAKE 1 TABLET BY MOUTH EVERY DAY What changed: when to take this   lidocaine 5 % Commonly known as: LIDODERM Place 1 patch onto the skin daily. Remove & Discard patch within 12 hours or as directed by MD   lidocaine-prilocaine cream Commonly known as: EMLA Apply to affected area once   metFORMIN 500 MG tablet Commonly known as: GLUCOPHAGE Take 0.5 tablets (250 mg total) by mouth 2 (two) times daily with a meal. What changed:   how much to take  when to take this  additional  instructions   methocarbamol 500 MG tablet Commonly known as: ROBAXIN Take 1 tablet (500 mg total) by mouth every 8 (eight) hours as needed for muscle spasms.   metoprolol tartrate 25 MG tablet Commonly known as: LOPRESSOR Take 1 tablet (25 mg total) by mouth 2 (two) times daily.   multivitamin with minerals Tabs tablet Take 1 tablet by mouth daily.   nitroGLYCERIN 0.4 MG SL tablet Commonly known as: NITROSTAT Place 1 tablet (0.4 mg total) under the tongue every 5 (five) minutes as needed for chest pain.   nystatin powder Commonly known as:  MYCOSTATIN/NYSTOP Apply topically.   ondansetron 4 MG tablet Commonly known as: ZOFRAN Take 1 tablet (4 mg total) by mouth every 6 (six) hours as needed for nausea.   ondansetron 8 MG tablet Commonly known as: Zofran Take 1 tablet (8 mg total) by mouth 2 (two) times daily as needed for refractory nausea / vomiting. Start on day 3 after chemotherapy.   prednisoLONE acetate 1 % ophthalmic suspension Commonly known as: PRED FORTE Place 1 drop into both eyes See admin instructions. Instill one drop into both eyes on Monday, Wednesday, Friday nights   prochlorperazine 10 MG tablet Commonly known as: COMPAZINE Take 1 tablet (10 mg total) by mouth every 6 (six) hours as needed (Nausea or vomiting).   traMADol 50 MG tablet Commonly known as: ULTRAM Take 50 mg by mouth every 6 (six) hours as needed.       Allergies:  Allergies  Allergen Reactions   Nitroglycerin Nausea And Vomiting   Ampicillin Other (See Comments)    Per allergy test    Past Medical History, Surgical history, Social history, and Family History were reviewed and updated.  Review of Systems: All other 10 point review of systems is negative.   Physical Exam:  vitals were not taken for this visit.   Wt Readings from Last 3 Encounters:  07/18/20 151 lb (68.5 kg)  07/04/20 152 lb (68.9 kg)  07/04/20 152 lb 0.6 oz (69 kg)    Ocular: Sclerae unicteric, pupils equal, round and reactive to light Ear-nose-throat: Oropharynx clear, dentition fair Lymphatic: No cervical or supraclavicular adenopathy Lungs no rales or rhonchi, good excursion bilaterally Heart regular rate and rhythm, no murmur appreciated Abd soft, nontender, positive bowel sounds, no liver or spleen tip palpated on exam, no fluid wave  MSK no focal spinal tenderness, no joint edema Neuro: non-focal, well-oriented, appropriate affect Breasts: Deferred   Lab Results  Component Value Date   WBC 6.7 08/01/2020   HGB 12.1 08/01/2020   HCT 37.7  08/01/2020   MCV 91.5 08/01/2020   PLT 162 08/01/2020   Lab Results  Component Value Date   FERRITIN 96 07/04/2020   IRON 55 07/04/2020   TIBC 335 07/04/2020   UIBC 280 07/04/2020   IRONPCTSAT 17 (L) 07/04/2020   Lab Results  Component Value Date   RETICCTPCT 1.9 01/05/2020   RBC 4.12 08/01/2020   No results found for: KPAFRELGTCHN, LAMBDASER, KAPLAMBRATIO No results found for: IGGSERUM, IGA, IGMSERUM No results found for: Odetta Pink, SPEI   Chemistry      Component Value Date/Time   NA 135 07/18/2020 1030   NA 139 07/12/2019 1110   K 3.9 07/18/2020 1030   CL 99 07/18/2020 1030   CO2 25 07/18/2020 1030   BUN 23 07/18/2020 1030   BUN 15 07/12/2019 1110   CREATININE 0.66 07/18/2020 1030   CREATININE 0.74 01/08/2017 1100  Component Value Date/Time   CALCIUM 9.8 07/18/2020 1030   ALKPHOS 41 07/18/2020 1030   AST 14 (L) 07/18/2020 1030   ALT 12 07/18/2020 1030   BILITOT 0.3 07/18/2020 1030       Impression and Plan: Ms. 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  She continues to tolerate treatment well.  We will proceed with cycle 4 today as planned.  She is taking the Robaxin twice a day. Prescription updated to reflect this.  We will repeat scans in 2 weeks to assess her response same day as MD follow-up.  They were encouraged to contact our office with any questions or concerns.   Laverna Peace, NP 11/10/20219:07 AM

## 2020-08-01 NOTE — Patient Instructions (Signed)
[facility] Discharge Instructions for Patients Receiving Chemotherapy  Today you received the following chemotherapy agents Oxaliplatin, Leucovorin and 5FU  To help prevent nausea and vomiting after your treatment, we encourage you to take your nausea medication as prescribed by MD. **Do not take zofran for 3 days after chemotherapy**   If you develop nausea and vomiting that is not controlled by your nausea medication, call the clinic.   BELOW ARE SYMPTOMS THAT SHOULD BE REPORTED IMMEDIATELY:  *FEVER GREATER THAN 100.5 F  *CHILLS WITH OR WITHOUT FEVER  NAUSEA AND VOMITING THAT IS NOT CONTROLLED WITH YOUR NAUSEA MEDICATION  *UNUSUAL SHORTNESS OF BREATH  *UNUSUAL BRUISING OR BLEEDING  TENDERNESS IN MOUTH AND THROAT WITH OR WITHOUT PRESENCE OF ULCERS  *URINARY PROBLEMS  *BOWEL PROBLEMS  UNUSUAL RASH Items with * indicate a potential emergency and should be followed up as soon as possible.  Feel free to call the clinic should you have any questions or concerns. The clinic phone number is [phone].  Please show the [place] at check-in to the Emergency Department and triage nurse.

## 2020-08-01 NOTE — Progress Notes (Signed)
Pt. stable and asymptomatic at discharge.

## 2020-08-02 ENCOUNTER — Telehealth: Payer: Self-pay

## 2020-08-02 ENCOUNTER — Telehealth: Payer: Self-pay | Admitting: *Deleted

## 2020-08-02 NOTE — Telephone Encounter (Signed)
Call received from patient's niece, [name] stating that pt woke up with an itching, scattered red rash all over her body and would like to know what to do.  Dr. Marin Olp notified.  Call placed back to patient's niece [name] and [name] notified per order of Dr. Marin Olp to have pt take OTC Benadryl 25 mg every four hours as needed for itching. [name] is appreciative of call back and has no further questions at this time.

## 2020-08-02 NOTE — Telephone Encounter (Signed)
Per 08/02/20 los pt needs to be  Scheduled for lab md scans.. pt already on sch for 08/13/20.  Inbasket sent to imaging to sch scan... AOM

## 2020-08-03 ENCOUNTER — Other Ambulatory Visit: Payer: Self-pay

## 2020-08-03 ENCOUNTER — Inpatient Hospital Stay: Payer: Medicaid Other

## 2020-08-03 DIAGNOSIS — C187 Malignant neoplasm of sigmoid colon: Secondary | ICD-10-CM

## 2020-08-03 DIAGNOSIS — Z5111 Encounter for antineoplastic chemotherapy: Secondary | ICD-10-CM | POA: Diagnosis not present

## 2020-08-03 MED ORDER — SODIUM CHLORIDE 0.9% FLUSH
10.0000 mL | INTRAVENOUS | Status: DC | PRN
  Administered 2020-08-03: 10 mL
  Filled 2020-08-03: qty 10

## 2020-08-03 MED ORDER — HEPARIN SOD (PORK) LOCK FLUSH 100 UNIT/ML IV SOLN
500.0000 [IU] | Freq: Once | INTRAVENOUS | Status: AC | PRN
  Administered 2020-08-03: 500 [IU]
  Filled 2020-08-03: qty 5

## 2020-08-03 NOTE — Patient Instructions (Signed)
Pt. Accompanied by daughter who interpreted for her. Pt. was stable and asymptomatic at discharge.

## 2020-08-07 ENCOUNTER — Encounter (HOSPITAL_BASED_OUTPATIENT_CLINIC_OR_DEPARTMENT_OTHER): Payer: Self-pay

## 2020-08-07 ENCOUNTER — Other Ambulatory Visit: Payer: Self-pay

## 2020-08-07 ENCOUNTER — Ambulatory Visit (HOSPITAL_BASED_OUTPATIENT_CLINIC_OR_DEPARTMENT_OTHER)
Admission: RE | Admit: 2020-08-07 | Discharge: 2020-08-07 | Disposition: A | Discharge status: Home / Self Care | Payer: Medicaid Other | Source: Ambulatory Visit | Attending: Family | Admitting: Family

## 2020-08-07 ENCOUNTER — Inpatient Hospital Stay: Payer: Medicaid Other

## 2020-08-07 VITALS — BP 138/79 | HR 88 | Temp 98.0°F

## 2020-08-07 DIAGNOSIS — C187 Malignant neoplasm of sigmoid colon: Secondary | ICD-10-CM | POA: Insufficient documentation

## 2020-08-07 DIAGNOSIS — Z5111 Encounter for antineoplastic chemotherapy: Secondary | ICD-10-CM | POA: Diagnosis not present

## 2020-08-07 DIAGNOSIS — C772 Secondary and unspecified malignant neoplasm of intra-abdominal lymph nodes: Secondary | ICD-10-CM

## 2020-08-07 DIAGNOSIS — Z95828 Presence of other vascular implants and grafts: Secondary | ICD-10-CM

## 2020-08-07 MED ORDER — HEPARIN SOD (PORK) LOCK FLUSH 100 UNIT/ML IV SOLN
500.0000 [IU] | Freq: Once | INTRAVENOUS | Status: AC
  Administered 2020-08-07: 500 [IU] via INTRAVENOUS
  Filled 2020-08-07: qty 5

## 2020-08-07 MED ORDER — SODIUM CHLORIDE 0.9% FLUSH
10.0000 mL | Freq: Once | INTRAVENOUS | Status: AC
  Administered 2020-08-07: 10 mL via INTRAVENOUS
  Filled 2020-08-07: qty 10

## 2020-08-07 MED ORDER — IOHEXOL 300 MG/ML  SOLN
100.0000 mL | Freq: Once | INTRAMUSCULAR | Status: AC | PRN
  Administered 2020-08-07: 100 mL via INTRAVENOUS

## 2020-08-07 NOTE — Patient Instructions (Signed)

## 2020-08-07 NOTE — Progress Notes (Signed)
Pt discharged in no apparent distress. Pt left ambulatory without assistance. Pt aware of discharge instructions and verbalized understanding and had no further questions.  

## 2020-08-13 ENCOUNTER — Inpatient Hospital Stay (HOSPITAL_BASED_OUTPATIENT_CLINIC_OR_DEPARTMENT_OTHER): Payer: Medicaid Other | Admitting: Hematology & Oncology

## 2020-08-13 ENCOUNTER — Other Ambulatory Visit (HOSPITAL_BASED_OUTPATIENT_CLINIC_OR_DEPARTMENT_OTHER): Payer: Medicaid Other

## 2020-08-13 ENCOUNTER — Inpatient Hospital Stay: Payer: Medicaid Other

## 2020-08-13 ENCOUNTER — Telehealth: Payer: Self-pay

## 2020-08-13 ENCOUNTER — Other Ambulatory Visit: Payer: Self-pay

## 2020-08-13 ENCOUNTER — Encounter: Payer: Self-pay | Admitting: Hematology & Oncology

## 2020-08-13 ENCOUNTER — Encounter: Payer: Self-pay | Admitting: *Deleted

## 2020-08-13 VITALS — BP 140/54 | HR 80 | Temp 97.8°F | Resp 18 | Wt 155.0 lb

## 2020-08-13 DIAGNOSIS — C772 Secondary and unspecified malignant neoplasm of intra-abdominal lymph nodes: Secondary | ICD-10-CM

## 2020-08-13 DIAGNOSIS — C187 Malignant neoplasm of sigmoid colon: Secondary | ICD-10-CM

## 2020-08-13 DIAGNOSIS — Z5111 Encounter for antineoplastic chemotherapy: Secondary | ICD-10-CM | POA: Diagnosis not present

## 2020-08-13 DIAGNOSIS — E088 Diabetes mellitus due to underlying condition with unspecified complications: Secondary | ICD-10-CM

## 2020-08-13 LAB — CMP (CANCER CENTER ONLY)
ALT: 13 U/L (ref 0–44)
AST: 15 U/L (ref 15–41)
Albumin: 3.8 g/dL (ref 3.5–5.0)
Alkaline Phosphatase: 47 U/L (ref 38–126)
Anion gap: 13 (ref 5–15)
BUN: 18 mg/dL (ref 8–23)
CO2: 24 mmol/L (ref 22–32)
Calcium: 9.5 mg/dL (ref 8.9–10.3)
Chloride: 100 mmol/L (ref 98–111)
Creatinine: 0.68 mg/dL (ref 0.44–1.00)
GFR, Estimated: >60 mL/min (ref >60)
Glucose, Bld: 193 mg/dL — ABNORMAL HIGH (ref 70–99)
Potassium: 3.9 mmol/L (ref 3.5–5.1)
Sodium: 137 mmol/L (ref 135–145)
Total Bilirubin: 0.2 mg/dL — ABNORMAL LOW (ref 0.3–1.2)
Total Protein: 6.8 g/dL (ref 6.5–8.1)

## 2020-08-13 LAB — URINALYSIS, COMPLETE (UACMP) WITH MICROSCOPIC
Bilirubin Urine: NEGATIVE
Glucose, UA: >=500 mg/dL — AB
Hgb urine dipstick: NEGATIVE
Ketones, ur: NEGATIVE mg/dL
Nitrite: NEGATIVE
Protein, ur: NEGATIVE mg/dL
Specific Gravity, Urine: <1.005 — ABNORMAL LOW (ref 1.005–1.030)
pH: 5.5 (ref 5.0–8.0)

## 2020-08-13 LAB — CBC WITH DIFFERENTIAL (CANCER CENTER ONLY)
Abs Immature Granulocytes: 0.06 10*3/uL (ref 0.00–0.07)
Basophils Absolute: 0 10*3/uL (ref 0.0–0.1)
Basophils Relative: 1 %
Eosinophils Absolute: 0.1 10*3/uL (ref 0.0–0.5)
Eosinophils Relative: 1 %
HCT: 38 % (ref 36.0–46.0)
Hemoglobin: 12.2 g/dL (ref 12.0–15.0)
Immature Granulocytes: 1 %
Lymphocytes Relative: 26 %
Lymphs Abs: 2.1 10*3/uL (ref 0.7–4.0)
MCH: 30.4 pg (ref 26.0–34.0)
MCHC: 32.1 g/dL (ref 30.0–36.0)
MCV: 94.8 fL (ref 80.0–100.0)
Monocytes Absolute: 1 10*3/uL (ref 0.1–1.0)
Monocytes Relative: 12 %
Neutro Abs: 5 10*3/uL (ref 1.7–7.7)
Neutrophils Relative %: 59 %
Platelet Count: 179 10*3/uL (ref 150–400)
RBC: 4.01 MIL/uL (ref 3.87–5.11)
RDW: 20.7 % — ABNORMAL HIGH (ref 11.5–15.5)
WBC Count: 8.2 10*3/uL (ref 4.0–10.5)
nRBC: 0.4 % — ABNORMAL HIGH (ref 0.0–0.2)

## 2020-08-13 LAB — LACTATE DEHYDROGENASE: LDH: 169 U/L (ref 98–192)

## 2020-08-13 LAB — CEA (IN HOUSE-CHCC): CEA (CHCC-In House): 3.32 ng/mL (ref 0.00–5.00)

## 2020-08-13 MED ORDER — SODIUM CHLORIDE 0.9 % IV SOLN
10.0000 mg | Freq: Once | INTRAVENOUS | Status: AC
  Administered 2020-08-13: 10 mg via INTRAVENOUS
  Filled 2020-08-13: qty 10

## 2020-08-13 MED ORDER — OXALIPLATIN CHEMO INJECTION 100 MG/20ML
68.0000 mg/m2 | Freq: Once | INTRAVENOUS | Status: AC
  Administered 2020-08-13: 115 mg via INTRAVENOUS
  Filled 2020-08-13: qty 20

## 2020-08-13 MED ORDER — SODIUM CHLORIDE 0.9% FLUSH
10.0000 mL | INTRAVENOUS | Status: DC | PRN
  Filled 2020-08-13: qty 10

## 2020-08-13 MED ORDER — LEUCOVORIN CALCIUM INJECTION 350 MG
400.0000 mg/m2 | Freq: Once | INTRAVENOUS | Status: AC
  Administered 2020-08-13: 684 mg via INTRAVENOUS
  Filled 2020-08-13: qty 34.2

## 2020-08-13 MED ORDER — PALONOSETRON HCL INJECTION 0.25 MG/5ML
INTRAVENOUS | Status: AC
  Filled 2020-08-13: qty 5

## 2020-08-13 MED ORDER — DOXYCYCLINE HYCLATE 100 MG PO TABS: 100.0000 mg | ORAL_TABLET | Freq: Two times a day (BID) | ORAL | 0 refills | Status: DC

## 2020-08-13 MED ORDER — SODIUM CHLORIDE 0.9 % IV SOLN
1920.0000 mg/m2 | INTRAVENOUS | Status: DC
  Administered 2020-08-13: 3300 mg via INTRAVENOUS
  Filled 2020-08-13: qty 66

## 2020-08-13 MED ORDER — DEXTROSE 5 % IV SOLN
Freq: Once | INTRAVENOUS | Status: AC
  Filled 2020-08-13: qty 250

## 2020-08-13 MED ORDER — FLUOROURACIL CHEMO INJECTION 2.5 GM/50ML
320.0000 mg/m2 | Freq: Once | INTRAVENOUS | Status: AC
  Administered 2020-08-13: 550 mg via INTRAVENOUS
  Filled 2020-08-13: qty 11

## 2020-08-13 MED ORDER — MUPIROCIN CALCIUM 2 % EX CREA: 1.0000 "application " | TOPICAL_CREAM | Freq: Two times a day (BID) | CUTANEOUS | 0 refills | Status: DC

## 2020-08-13 MED ORDER — PALONOSETRON HCL INJECTION 0.25 MG/5ML
0.2500 mg | Freq: Once | INTRAVENOUS | Status: AC
  Administered 2020-08-13: 0.25 mg via INTRAVENOUS

## 2020-08-13 MED ORDER — HEPARIN SOD (PORK) LOCK FLUSH 100 UNIT/ML IV SOLN
500.0000 [IU] | Freq: Once | INTRAVENOUS | Status: DC | PRN
  Filled 2020-08-13: qty 5

## 2020-08-13 NOTE — Patient Instructions (Signed)

## 2020-08-13 NOTE — Telephone Encounter (Signed)
Per 08/13/20 los pt needs 12/8, pt already on sch, scheduled one more out due to holiday pt will receive sch on AVS from [place] room,,,, AOM

## 2020-08-13 NOTE — Progress Notes (Signed)
Hematology and Oncology Follow Up Visit  Bailey Walker [id number] [birth year] 74 y.o. 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  She really does a fantastic job.  Bailey Walker.  She did have a CT scan done recently.  The CT scan showed that there was stability and a nodule in the right upper lobe measuring 1.5 x 1 cm.  There was some new small subcentimeter nodules that were noted which were nonspecific.  Otherwise, Bailey looks relatively stable.  Her last CEA was stable at 3.1.  She has had no problems with the colostomy.  She does have little bit of bloating in the abdomen.  It feels like it sounds like gas.  I told her to try some over-the-counter agent to try to help absorb some of the gas.  She has had no fever.  She has had no leg swelling.  She does have a problem with the big toe on the left foot.  There may be an ingrown toenail.  There is erythema and pain in the toe.  We will try her on some doxycycline and some Bactroban cream to see this might help.  She has had no problems with rashes.  She has had no cough or shortness of breath.  Her appetite seems to be doing pretty well.  Overall, her performance status is ECOG 1.   Medications:  Allergies as of 08/13/2020      Reactions   Nitroglycerin Nausea And Vomiting   Ampicillin Other (See Comments)   Per allergy test      Medication List       Accurate as of August 13, 2020  8:51 AM. If you have any questions, ask your nurse or doctor.        STOP taking these medications   diclofenac Sodium 1 % Gel Commonly known as: VOLTAREN Stopped by: Volanda Napoleon, MD   lidocaine 5  % Commonly known as: LIDODERM Stopped by: Volanda Napoleon, MD     TAKE these medications   alendronate 70 MG tablet Commonly known as: FOSAMAX Take 70 mg by mouth every Sunday.   aspirin EC 81 MG tablet Take 81 mg by mouth daily. Swallow whole.   denosumab 60 MG/ML Sosy injection Commonly known as: PROLIA Inject 60 mg into the skin every 6 (six) months.   dexamethasone 4 MG tablet Commonly known as: DECADRON Take 2 tablets (8 mg total) by mouth daily. Start the day after chemotherapy for 2 days. Take with food.   Dexilant 30 MG capsule Generic drug: Dexlansoprazole TAKE 1 CAPSULE BY MOUTH EVERY DAY What changed: how much to take   docusate sodium 100 MG capsule Commonly known as: COLACE Take 1 capsule (100 mg total) by mouth 2 (two) times daily.   feeding supplement Liqd Take 237 mLs by mouth 3 (three) times daily between meals.   gabapentin 100 MG capsule Commonly known as: NEURONTIN Take 100 mg by mouth 3 (three) times daily.   glipiZIDE 5 MG tablet Commonly known as: GLUCOTROL TAKE 1 TABLET BY MOUTH TWICE A DAY BEFORE MEALS What changed: See the new instructions.   levothyroxine 75 MCG tablet Commonly known as:  SYNTHROID TAKE 1 TABLET BY MOUTH EVERY DAY What changed: when to take this   lidocaine-prilocaine cream Commonly known as: EMLA Apply to affected area once   metFORMIN 500 MG tablet Commonly known as: GLUCOPHAGE Take 0.5 tablets (250 mg total) by mouth 2 (two) times daily with a meal. What changed:   how much to take  when to take this  additional instructions   methocarbamol 500 MG tablet Commonly known as: ROBAXIN Take 1 tablet (500 mg total) by mouth every 12 (twelve) hours as needed for muscle spasms.   metoprolol tartrate 25 MG tablet Commonly known as: LOPRESSOR Take 1 tablet (25 mg total) by mouth 2 (two) times daily.   multivitamin with minerals Tabs tablet Take 1 tablet by mouth daily.   nitroGLYCERIN 0.4 MG SL  tablet Commonly known as: NITROSTAT Place 1 tablet (0.4 mg total) under the tongue every 5 (five) minutes as needed for chest pain.   nystatin powder Commonly known as: MYCOSTATIN/NYSTOP Apply topically.   ondansetron 4 MG tablet Commonly known as: ZOFRAN Take 1 tablet (4 mg total) by mouth every 6 (six) hours as needed for nausea.   ondansetron 8 MG tablet Commonly known as: Zofran Take 1 tablet (8 mg total) by mouth 2 (two) times daily as needed for refractory nausea / vomiting. Start on day 3 after chemotherapy.   prednisoLONE acetate 1 % ophthalmic suspension Commonly known as: PRED FORTE Place 1 drop into both eyes See admin instructions. Instill one drop into both eyes on Monday, Wednesday, Friday nights   prochlorperazine 10 MG tablet Commonly known as: COMPAZINE Take 1 tablet (10 mg total) by mouth every 6 (six) hours as needed (Nausea or vomiting).   traMADol 50 MG tablet Commonly known as: ULTRAM Take 50 mg by mouth every 6 (six) hours as needed.       Allergies:  Allergies  Allergen Reactions  . Nitroglycerin Nausea And Vomiting  . Ampicillin Other (See Comments)    Per allergy test    Past Medical History, Surgical history, Social history, and Family History were reviewed and updated.  Review of Systems: Review of Systems  Constitutional: Negative.   HENT: Negative.   Eyes: Negative.   Respiratory: Negative.   Cardiovascular: Negative.   Gastrointestinal: Positive for abdominal pain.  Genitourinary: Positive for dysuria and hematuria.  Musculoskeletal: Positive for joint pain.  Skin: Negative.   Neurological: Negative.   Endo/Heme/Allergies: Negative.      Physical Exam:  weight is 155 lb (70.3 kg). Her oral temperature is 97.8 F (36.6 C). Her blood pressure is 140/54 (abnormal) and her pulse is 80. Her respiration is 18 and oxygen saturation is 100%.   Wt Readings from Last 3 Encounters:  08/13/20 155 lb (70.3 kg)  08/01/20 152 lb (68.9 kg)   07/18/20 151 lb (68.5 kg)    Physical Exam Vitals reviewed.  HENT:     Head: Normocephalic and atraumatic.  Eyes:     Pupils: Pupils are equal, round, and reactive to light.  Cardiovascular:     Rate and Rhythm: Normal rate and regular rhythm.     Heart sounds: Normal heart sounds.  Pulmonary:     Effort: Pulmonary effort is normal.     Breath sounds: Normal breath sounds.  Abdominal:     General: Bowel sounds are normal.     Palpations: Abdomen is soft.     Comments: Abdominal exam shows a slightly distended abdomen.  Bowel sounds are present.  She has  a colostomy in the left lower quadrant.  Her abdominal laparotomy wound is dressed.  There is some slight discomfort with palpation.  She has hypertympany to percussion.  There is no obvious abdominal mass.  There is no fluid wave.  Is no palpable liver or spleen tip.  Musculoskeletal:        General: No tenderness or deformity. Normal range of motion.     Cervical back: Normal range of motion.     Comments: Around the big toe of the left foot, there is erythema and swelling.  There is some tenderness to palpation.  Lymphadenopathy:     Cervical: No cervical adenopathy.  Skin:    General: Skin is warm and dry.     Findings: No erythema or rash.  Neurological:     Mental Status: She is alert and oriented to person, place, and time.  Psychiatric:        Behavior: Behavior normal.        Thought Content: Thought content normal.        Judgment: Judgment normal.       Lab Results  Component Value Date   WBC 8.2 08/13/2020   HGB 12.2 08/13/2020   HCT 38.0 08/13/2020   MCV 94.8 08/13/2020   PLT 179 08/13/2020   Lab Results  Component Value Date   FERRITIN 96 07/04/2020   IRON 55 07/04/2020   TIBC 335 07/04/2020   UIBC 280 07/04/2020   IRONPCTSAT 17 (L) 07/04/2020   Lab Results  Component Value Date   RETICCTPCT 1.9 01/05/2020   RBC 4.01 08/13/2020   No results found for: KPAFRELGTCHN, LAMBDASER, KAPLAMBRATIO No  results found for: IGGSERUM, IGA, IGMSERUM No results found for: Odetta Pink, SPEI   Chemistry      Component Value Date/Time   NA 136 08/01/2020 0833   NA 139 07/12/2019 1110   K 3.7 08/01/2020 0833   CL 101 08/01/2020 0833   CO2 26 08/01/2020 0833   BUN 23 08/01/2020 0833   BUN 15 07/12/2019 1110   CREATININE 0.67 08/01/2020 0833   CREATININE 0.74 01/08/2017 1100      Component Value Date/Time   CALCIUM 9.7 08/01/2020 0833   ALKPHOS 38 08/01/2020 0833   AST 15 08/01/2020 0833   ALT 14 08/01/2020 0833   BILITOT 0.3 08/01/2020 0833       Impression and Plan: Bailey Walker is a very pleasant 74 yo Kurdish female with stage IIIB ([id number]) 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   I am not convinced at all that there is any problems with the lungs.  I do believe that these nodules are not malignant.  Her CEA has been normal.  We will go ahead and plan for her fifth cycle of treatment.  I think we have to proceed.  We will see about the big toe of the left foot.  There may be an infection there.  We will also do a urinalysis on her.  I will send in doxycycline and Bactroban cream to try to help with the toe.  If there is no improvement when we see her back, we will have to see about getting her to a podiatrist.   Volanda Napoleon, MD 11/22/20218:51 AM

## 2020-08-13 NOTE — Patient Instructions (Signed)
[facility] Discharge Instructions for Patients Receiving Chemotherapy  Today you received the following chemotherapy agents Oxaliplatin, Leucovorin and 5FU  To help prevent nausea and vomiting after your treatment, we encourage you to take your nausea medication as prescribed by MD. **Do not take zofran for 3 days after chemotherapy**   If you develop nausea and vomiting that is not controlled by your nausea medication, call the clinic.   BELOW ARE SYMPTOMS THAT SHOULD BE REPORTED IMMEDIATELY:  *FEVER GREATER THAN 100.5 F  *CHILLS WITH OR WITHOUT FEVER  NAUSEA AND VOMITING THAT IS NOT CONTROLLED WITH YOUR NAUSEA MEDICATION  *UNUSUAL SHORTNESS OF BREATH  *UNUSUAL BRUISING OR BLEEDING  TENDERNESS IN MOUTH AND THROAT WITH OR WITHOUT PRESENCE OF ULCERS  *URINARY PROBLEMS  *BOWEL PROBLEMS  UNUSUAL RASH Items with * indicate a potential emergency and should be followed up as soon as possible.  Feel free to call the clinic should you have any questions or concerns. The clinic phone number is [phone].  Please show the [facility] at check-in to the Emergency Department and triage nurse.

## 2020-08-13 NOTE — Progress Notes (Signed)
Pt discharged in no apparent distress. Pt left ambulatory without assistance. Pt aware of discharge instructions and verbalized understanding and had no further questions.  

## 2020-08-15 ENCOUNTER — Other Ambulatory Visit: Payer: Self-pay | Admitting: *Deleted

## 2020-08-15 ENCOUNTER — Inpatient Hospital Stay: Payer: Medicaid Other

## 2020-08-15 ENCOUNTER — Other Ambulatory Visit: Payer: Self-pay

## 2020-08-15 VITALS — BP 103/87 | HR 92 | Resp 18

## 2020-08-15 DIAGNOSIS — Z5111 Encounter for antineoplastic chemotherapy: Secondary | ICD-10-CM | POA: Diagnosis not present

## 2020-08-15 DIAGNOSIS — C187 Malignant neoplasm of sigmoid colon: Secondary | ICD-10-CM

## 2020-08-15 MED ORDER — HEPARIN SOD (PORK) LOCK FLUSH 100 UNIT/ML IV SOLN
500.0000 [IU] | Freq: Once | INTRAVENOUS | Status: AC | PRN
  Administered 2020-08-15: 500 [IU]
  Filled 2020-08-15: qty 5

## 2020-08-15 MED ORDER — SODIUM CHLORIDE 0.9% FLUSH
10.0000 mL | INTRAVENOUS | Status: DC | PRN
  Administered 2020-08-15: 10 mL
  Filled 2020-08-15: qty 10

## 2020-08-15 MED ORDER — MUPIROCIN 2 % EX OINT: 1.0000 "application " | TOPICAL_OINTMENT | Freq: Two times a day (BID) | CUTANEOUS | 0 refills | Status: DC

## 2020-08-15 NOTE — Patient Instructions (Signed)

## 2020-08-15 NOTE — Progress Notes (Signed)
Pt discharged in no apparent distress. Pt left ambulatory without assistance. Pt aware of discharge instructions and verbalized understanding and had no further questions.  

## 2020-08-22 ENCOUNTER — Ambulatory Visit (INDEPENDENT_AMBULATORY_CARE_PROVIDER_SITE_OTHER): Payer: Medicaid Other | Admitting: Podiatry

## 2020-08-22 ENCOUNTER — Encounter: Payer: Self-pay | Admitting: Podiatry

## 2020-08-22 ENCOUNTER — Other Ambulatory Visit: Payer: Self-pay

## 2020-08-22 DIAGNOSIS — E1142 Type 2 diabetes mellitus with diabetic polyneuropathy: Secondary | ICD-10-CM | POA: Diagnosis not present

## 2020-08-22 DIAGNOSIS — L600 Ingrowing nail: Secondary | ICD-10-CM | POA: Diagnosis not present

## 2020-08-22 NOTE — Progress Notes (Signed)
Subjective:   Patient ID: Bailey Walker, female   DOB: 74 y.o.   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  Debrided all nails carefully took out the medial border left hallux and necrotic tissue applied Neosporin dressing and reappoint and advised on soaks and may require more aggressive treatment if it remains persistent

## 2020-08-24 ENCOUNTER — Telehealth: Payer: Self-pay

## 2020-08-24 NOTE — Telephone Encounter (Signed)
Received call from pt's niece stating that pt has had severe abd pain x 2 days & continues to have difficulty urinating. [name] reports normal stool in colostomy bag and usual pattern. No odor nor color change to urine; just occasional pressure. Also reports one episode of vomiting yesterday. She "does not think the nausea meds are working."  Spoke with Dr Marin Olp who is out of the office as On Call MD today. Per Dr Marin Olp if these symptoms persist, pt to go to ED.   Called to report this to [name] who states pt is currently sleeping. She believes pt is feeling "a little better" today than yesterday. She will continue to monitor pt and take her to ED PRN. dph

## 2020-08-29 ENCOUNTER — Inpatient Hospital Stay: Payer: Medicaid Other

## 2020-08-29 ENCOUNTER — Inpatient Hospital Stay: Payer: Medicaid Other | Attending: Hematology and Oncology

## 2020-08-29 ENCOUNTER — Inpatient Hospital Stay (HOSPITAL_BASED_OUTPATIENT_CLINIC_OR_DEPARTMENT_OTHER): Payer: Medicaid Other | Admitting: Hematology & Oncology

## 2020-08-29 ENCOUNTER — Other Ambulatory Visit: Payer: Self-pay

## 2020-08-29 ENCOUNTER — Encounter: Payer: Self-pay | Admitting: Hematology & Oncology

## 2020-08-29 ENCOUNTER — Other Ambulatory Visit: Payer: Self-pay | Admitting: *Deleted

## 2020-08-29 ENCOUNTER — Telehealth: Payer: Self-pay

## 2020-08-29 VITALS — Wt 153.0 lb

## 2020-08-29 DIAGNOSIS — C772 Secondary and unspecified malignant neoplasm of intra-abdominal lymph nodes: Secondary | ICD-10-CM | POA: Diagnosis not present

## 2020-08-29 DIAGNOSIS — R300 Dysuria: Secondary | ICD-10-CM

## 2020-08-29 DIAGNOSIS — E088 Diabetes mellitus due to underlying condition with unspecified complications: Secondary | ICD-10-CM

## 2020-08-29 DIAGNOSIS — C187 Malignant neoplasm of sigmoid colon: Secondary | ICD-10-CM | POA: Diagnosis not present

## 2020-08-29 DIAGNOSIS — Z7982 Long term (current) use of aspirin: Secondary | ICD-10-CM | POA: Diagnosis not present

## 2020-08-29 DIAGNOSIS — Z5111 Encounter for antineoplastic chemotherapy: Secondary | ICD-10-CM | POA: Diagnosis present

## 2020-08-29 DIAGNOSIS — R112 Nausea with vomiting, unspecified: Secondary | ICD-10-CM | POA: Insufficient documentation

## 2020-08-29 DIAGNOSIS — D510 Vitamin B12 deficiency anemia due to intrinsic factor deficiency: Secondary | ICD-10-CM | POA: Diagnosis present

## 2020-08-29 DIAGNOSIS — Z7984 Long term (current) use of oral hypoglycemic drugs: Secondary | ICD-10-CM | POA: Diagnosis not present

## 2020-08-29 DIAGNOSIS — Z79899 Other long term (current) drug therapy: Secondary | ICD-10-CM | POA: Diagnosis not present

## 2020-08-29 DIAGNOSIS — Z95828 Presence of other vascular implants and grafts: Secondary | ICD-10-CM

## 2020-08-29 DIAGNOSIS — Z933 Colostomy status: Secondary | ICD-10-CM | POA: Insufficient documentation

## 2020-08-29 LAB — CBC WITH DIFFERENTIAL (CANCER CENTER ONLY)
Abs Immature Granulocytes: 0.08 10*3/uL — ABNORMAL HIGH (ref 0.00–0.07)
Basophils Absolute: 0 10*3/uL (ref 0.0–0.1)
Basophils Relative: 1 %
Eosinophils Absolute: 0.1 10*3/uL (ref 0.0–0.5)
Eosinophils Relative: 1 %
HCT: 34.7 % — ABNORMAL LOW (ref 36.0–46.0)
Hemoglobin: 11.5 g/dL — ABNORMAL LOW (ref 12.0–15.0)
Immature Granulocytes: 2 %
Lymphocytes Relative: 49 %
Lymphs Abs: 1.8 10*3/uL (ref 0.7–4.0)
MCH: 32 pg (ref 26.0–34.0)
MCHC: 33.1 g/dL (ref 30.0–36.0)
MCV: 96.7 fL (ref 80.0–100.0)
Monocytes Absolute: 0.8 10*3/uL (ref 0.1–1.0)
Monocytes Relative: 22 %
Neutro Abs: 0.9 10*3/uL — ABNORMAL LOW (ref 1.7–7.7)
Neutrophils Relative %: 25 %
Platelet Count: 189 10*3/uL (ref 150–400)
RBC: 3.59 MIL/uL — ABNORMAL LOW (ref 3.87–5.11)
RDW: 21.3 % — ABNORMAL HIGH (ref 11.5–15.5)
WBC Count: 3.6 10*3/uL — ABNORMAL LOW (ref 4.0–10.5)
nRBC: 0 % (ref 0.0–0.2)

## 2020-08-29 LAB — LACTATE DEHYDROGENASE: LDH: 189 U/L (ref 98–192)

## 2020-08-29 LAB — CMP (CANCER CENTER ONLY)
ALT: 20 U/L (ref 0–44)
AST: 23 U/L (ref 15–41)
Albumin: 3.1 g/dL — ABNORMAL LOW (ref 3.5–5.0)
Alkaline Phosphatase: 40 U/L (ref 38–126)
Anion gap: 11 (ref 5–15)
BUN: 10 mg/dL (ref 8–23)
CO2: 24 mmol/L (ref 22–32)
Calcium: 8.9 mg/dL (ref 8.9–10.3)
Chloride: 103 mmol/L (ref 98–111)
Creatinine: 0.62 mg/dL (ref 0.44–1.00)
GFR, Estimated: >60 mL/min (ref >60)
Glucose, Bld: 145 mg/dL — ABNORMAL HIGH (ref 70–99)
Potassium: 3.3 mmol/L — ABNORMAL LOW (ref 3.5–5.1)
Sodium: 138 mmol/L (ref 135–145)
Total Bilirubin: 0.3 mg/dL (ref 0.3–1.2)
Total Protein: 5.4 g/dL — ABNORMAL LOW (ref 6.5–8.1)

## 2020-08-29 LAB — URINALYSIS, COMPLETE (UACMP) WITH MICROSCOPIC
Bilirubin Urine: NEGATIVE
Glucose, UA: 100 mg/dL — AB
Hgb urine dipstick: NEGATIVE
Ketones, ur: NEGATIVE mg/dL
Leukocytes,Ua: NEGATIVE
Nitrite: NEGATIVE
Protein, ur: NEGATIVE mg/dL
Specific Gravity, Urine: 1.02 (ref 1.005–1.030)
pH: 5.5 (ref 5.0–8.0)

## 2020-08-29 LAB — CEA (IN HOUSE-CHCC): CEA (CHCC-In House): 2.55 ng/mL (ref 0.00–5.00)

## 2020-08-29 MED ORDER — SODIUM CHLORIDE 0.9 % IV SOLN
INTRAVENOUS | Status: DC
  Filled 2020-08-29: qty 250

## 2020-08-29 MED ORDER — HEPARIN SOD (PORK) LOCK FLUSH 100 UNIT/ML IV SOLN
500.0000 [IU] | Freq: Once | INTRAVENOUS | Status: AC
  Administered 2020-08-29: 500 [IU] via INTRAVENOUS
  Filled 2020-08-29: qty 5

## 2020-08-29 MED ORDER — SODIUM CHLORIDE 0.9 % IV SOLN
Freq: Once | INTRAVENOUS | Status: DC
  Filled 2020-08-29: qty 250

## 2020-08-29 MED ORDER — SODIUM CHLORIDE 0.9% FLUSH
10.0000 mL | Freq: Once | INTRAVENOUS | Status: AC
  Administered 2020-08-29: 10 mL via INTRAVENOUS
  Filled 2020-08-29: qty 10

## 2020-08-29 NOTE — Progress Notes (Signed)
Hematology and Oncology Follow Up Visit  Matika Bartell [id number] 09-26-1945 74 y.o. 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 Bordenave is here today with her niece who helps interpret for [place].  Unfortunately, Ms. Nuno has been having some difficulty.  She been having some problems with nausea.  She had one episode of vomiting.  She has been having some abdominal pain.  She has been having some issues with dysuria.  I just do not think that we can treat her today.  I really think she needs have a little bit of a break from treatment.  She has had no rashes.  Is been no bleeding.  The anterior abdominal wall wound seems to be healing up.  She has had no problems with fever.  There is been no mouth sores.  Her last CEA was 3.3 which is holding steady.  This is always been on the lower side.  I would have to say that her performance status right now is probably ECOG 2.    Medications:  Allergies as of 08/29/2020      Reactions   Nitroglycerin Nausea And Vomiting   Ampicillin Other (See Comments)   Per allergy test      Medication List       Accurate as of August 29, 2020  9:39 AM. If you have any questions, ask your nurse or doctor.        alendronate 70 MG tablet Commonly known as: FOSAMAX Take 70 mg by mouth every Sunday.   aspirin EC 81 MG tablet Take 81 mg by mouth daily. Swallow whole.   denosumab 60 MG/ML Sosy injection Commonly known as: PROLIA Inject 60 mg into the skin every 6 (six) months.   dexamethasone 4 MG tablet Commonly known as: DECADRON Take 2 tablets (8 mg total) by mouth daily. Start the day after chemotherapy for 2 days. Take with food.   Dexilant 30 MG capsule Generic drug: Dexlansoprazole TAKE 1  CAPSULE BY MOUTH EVERY DAY What changed: how much to take   docusate sodium 100 MG capsule Commonly known as: COLACE Take 1 capsule (100 mg total) by mouth 2 (two) times daily.   doxycycline 100 MG tablet Commonly known as: VIBRA-TABS Take 1 tablet (100 mg total) by mouth 2 (two) times daily.   feeding supplement Liqd Take 237 mLs by mouth 3 (three) times daily between meals.   gabapentin 100 MG capsule Commonly known as: NEURONTIN Take 100 mg by mouth 3 (three) times daily.   glipiZIDE 5 MG tablet Commonly known as: GLUCOTROL TAKE 1 TABLET BY MOUTH TWICE A DAY BEFORE MEALS What changed: See the new instructions.   levothyroxine 75 MCG tablet Commonly known as: SYNTHROID TAKE 1 TABLET BY MOUTH EVERY DAY What changed: when to take this   lidocaine-prilocaine cream Commonly known as: EMLA Apply to affected area once   metFORMIN 500 MG tablet Commonly known as: GLUCOPHAGE Take 0.5 tablets (250 mg total) by mouth 2 (two) times daily with a meal. What changed:   how much to take  when to take this  additional instructions   methocarbamol 500 MG tablet Commonly known as: ROBAXIN Take 1 tablet (500 mg total) by mouth every 12 (twelve) hours as needed for muscle spasms.  metoprolol tartrate 25 MG tablet Commonly known as: LOPRESSOR Take 1 tablet (25 mg total) by mouth 2 (two) times daily.   multivitamin with minerals Tabs tablet Take 1 tablet by mouth daily.   mupirocin ointment 2 % Commonly known as: BACTROBAN Apply 1 application topically 2 (two) times daily.   nitroGLYCERIN 0.4 MG SL tablet Commonly known as: NITROSTAT Place 1 tablet (0.4 mg total) under the tongue every 5 (five) minutes as needed for chest pain.   nystatin powder Commonly known as: MYCOSTATIN/NYSTOP Apply topically.   ondansetron 4 MG tablet Commonly known as: ZOFRAN Take 1 tablet (4 mg total) by mouth every 6 (six) hours as needed for nausea.   ondansetron 8 MG tablet Commonly  known as: Zofran Take 1 tablet (8 mg total) by mouth 2 (two) times daily as needed for refractory nausea / vomiting. Start on day 3 after chemotherapy.   prednisoLONE acetate 1 % ophthalmic suspension Commonly known as: PRED FORTE Place 1 drop into both eyes See admin instructions. Instill one drop into both eyes on Monday, Wednesday, Friday nights   prochlorperazine 10 MG tablet Commonly known as: COMPAZINE Take 1 tablet (10 mg total) by mouth every 6 (six) hours as needed (Nausea or vomiting).   traMADol 50 MG tablet Commonly known as: ULTRAM Take 50 mg by mouth every 6 (six) hours as needed.       Allergies:  Allergies  Allergen Reactions  . Nitroglycerin Nausea And Vomiting  . Ampicillin Other (See Comments)    Per allergy test    Past Medical History, Surgical history, Social history, and Family History were reviewed and updated.  Review of Systems: Review of Systems  Constitutional: Negative.   HENT: Negative.   Eyes: Negative.   Respiratory: Negative.   Cardiovascular: Negative.   Gastrointestinal: Positive for abdominal pain.  Genitourinary: Positive for dysuria and hematuria.  Musculoskeletal: Positive for joint pain.  Skin: Negative.   Neurological: Negative.   Endo/Heme/Allergies: Negative.      Physical Exam:  weight is 153 lb (69.4 kg).   Wt Readings from Last 3 Encounters:  08/29/20 153 lb (69.4 kg)  08/13/20 155 lb (70.3 kg)  08/01/20 152 lb (68.9 kg)    Physical Exam Vitals reviewed.  HENT:     Head: Normocephalic and atraumatic.  Eyes:     Pupils: Pupils are equal, round, and reactive to light.  Cardiovascular:     Rate and Rhythm: Normal rate and regular rhythm.     Heart sounds: Normal heart sounds.  Pulmonary:     Effort: Pulmonary effort is normal.     Breath sounds: Normal breath sounds.  Abdominal:     General: Bowel sounds are normal.     Palpations: Abdomen is soft.     Comments: Abdominal exam shows a slightly distended  abdomen.  Bowel sounds are present.  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  Her abdominal laparotomy wound is dressed.  There is some slight discomfort with palpation.  She has hypertympany to percussion.  There is no obvious abdominal mass.  There is no fluid wave.  Is no palpable liver or spleen tip.  Musculoskeletal:        General: No tenderness or deformity. Normal range of motion.     Cervical back: Normal range of motion.     Comments: Around the big toe of the left foot, there is erythema and swelling.  There is some tenderness to palpation.  Lymphadenopathy:  Cervical: No cervical adenopathy.  Skin:    General: Skin is warm and dry.     Findings: No erythema or rash.  Neurological:     Mental Status: She is alert and oriented to person, place, and time.  Psychiatric:        Behavior: Behavior normal.        Thought Content: Thought content normal.        Judgment: Judgment normal.       Lab Results  Component Value Date   WBC 3.6 (L) 08/29/2020   HGB 11.5 (L) 08/29/2020   HCT 34.7 (L) 08/29/2020   MCV 96.7 08/29/2020   PLT 189 08/29/2020   Lab Results  Component Value Date   FERRITIN 96 07/04/2020   IRON 55 07/04/2020   TIBC 335 07/04/2020   UIBC 280 07/04/2020   IRONPCTSAT 17 (L) 07/04/2020   Lab Results  Component Value Date   RETICCTPCT 1.9 01/05/2020   RBC 3.59 (L) 08/29/2020   No results found for: KPAFRELGTCHN, LAMBDASER, KAPLAMBRATIO No results found for: IGGSERUM, IGA, IGMSERUM No results found for: Odetta Pink, SPEI   Chemistry      Component Value Date/Time   NA 137 08/13/2020 0843   NA 139 07/12/2019 1110   K 3.9 08/13/2020 0843   CL 100 08/13/2020 0843   CO2 24 08/13/2020 0843   BUN 18 08/13/2020 0843   BUN 15 07/12/2019 1110   CREATININE 0.68 08/13/2020 0843   CREATININE 0.74 01/08/2017 1100      Component Value Date/Time   CALCIUM 9.5 08/13/2020 0843   ALKPHOS 47  08/13/2020 0843   AST 15 08/13/2020 0843   ALT 13 08/13/2020 0843   BILITOT 0.2 (L) 08/13/2020 0843       Impression and Plan: Ms. 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   I am going to hold her chemotherapy for a couple weeks.  We will see about a urinalysis on her.  I am also going to give her some IV fluid today.  I think this would be helpful.  Of note, she had the toenail taken off the big toe on the left foot.  I will know if this might be an issue.  I will hold on antibiotics for right now until we see what the urinalysis shows.  Her albumin is down a little bit.  Again I want her to try to eat a little bit more.  We can certainly put her on an appetite stimulant.  She says that she will try to do increase her nutritional intake.    Volanda Napoleon, MD 12/8/20219:39 AM

## 2020-08-29 NOTE — Telephone Encounter (Signed)
08/29/20 LOS appts needed-09/10/20 appts already in place, 09/26/20 appts added... AOM

## 2020-08-29 NOTE — Progress Notes (Unsigned)
nomal

## 2020-08-29 NOTE — Progress Notes (Signed)
Pt discharged in no apparent distress. Pt left ambulatory without assistance. Pt aware of discharge instructions and verbalized understanding and had no further questions.  

## 2020-08-29 NOTE — Patient Instructions (Signed)
Dehydration, Adult Dehydration is a condition in which there is not enough water or other fluids in the body. This happens when a person loses more fluids than he or she takes in. Important organs, such as the kidneys, brain, and heart, cannot function without a proper amount of fluids. Any loss of fluids from the body can lead to dehydration. Dehydration can be mild, moderate, or severe. It should be treated right away to prevent it from becoming severe. What are the causes? Dehydration may be caused by:  Conditions that cause loss of water or other fluids, such as diarrhea, vomiting, or sweating or urinating a lot.  Not drinking enough fluids, especially when you are ill or doing activities that require a lot of energy.  Other illnesses and conditions, such as fever or infection.  Certain medicines, such as medicines that remove excess fluid from the body (diuretics).  Lack of safe drinking water.  Not being able to get enough water and food. What increases the risk? The following factors may make you more likely to develop this condition:  Having a long-term (chronic) illness that has not been treated properly, such as diabetes, heart disease, or kidney disease.  Being 65 years of age or older.  Having a disability.  Living in a place that is high in altitude, where thinner, drier air causes more fluid loss.  Doing exercises that put stress on your body for a long time (endurance sports). What are the signs or symptoms? Symptoms of dehydration depend on how severe it is. Mild or moderate dehydration  Thirst.  Dry lips or dry mouth.  Dizziness or light-headedness, especially when standing up from a seated position.  Muscle cramps.  Dark urine. Urine may be the color of tea.  Less urine or tears produced than usual.  Headache. Severe dehydration  Changes in skin. Your skin may be cold and clammy, blotchy, or pale. Your skin also may not return to normal after being  lightly pinched and released.  Little or no tears, urine, or sweat.  Changes in vital signs, such as rapid breathing and low blood pressure. Your pulse may be weak or may be faster than 100 beats a minute when you are sitting still.  Other changes, such as: ? Feeling very thirsty. ? Sunken eyes. ? Cold hands and feet. ? Confusion. ? Being very tired (lethargic) or having trouble waking from sleep. ? Short-term weight loss. ? Loss of consciousness. How is this diagnosed? This condition is diagnosed based on your symptoms and a physical exam. You may have blood and urine tests to help confirm the diagnosis. How is this treated? Treatment for this condition depends on how severe it is. Treatment should be started right away. Do not wait until dehydration becomes severe. Severe dehydration is an emergency and needs to be treated in a hospital.  Mild or moderate dehydration can be treated at home. You may be asked to: ? Drink more fluids. ? Drink an oral rehydration solution (ORS). This drink helps restore proper amounts of fluids and salts and minerals in the blood (electrolytes).  Severe dehydration can be treated: ? With IV fluids. ? By correcting abnormal levels of electrolytes. This is often done by giving electrolytes through a tube that is passed through your nose and into your stomach (nasogastric tube, or NG tube). ? By treating the underlying cause of dehydration. Follow these instructions at home: Oral rehydration solution If told by your health care provider, drink an ORS:  Make   an ORS by following instructions on the package.  Start by drinking small amounts, about  cup (120 mL) every 5-10 minutes.  Slowly increase how much you drink until you have taken the amount recommended by your health care provider. Eating and drinking         Drink enough clear fluid to keep your urine pale yellow. If you were told to drink an ORS, finish the ORS first and then start slowly  drinking other clear fluids. Drink fluids such as: ? Water. Do not drink only water. Doing that can lead to hyponatremia, which is having too little salt (sodium) in the body. ? Water from ice chips you suck on. ? Fruit juice that you have added water to (diluted fruit juice). ? Low-calorie sports drinks.  Eat foods that contain a healthy balance of electrolytes, such as bananas, oranges, potatoes, tomatoes, and spinach.  Do not drink alcohol.  Avoid the following: ? Drinks that contain a lot of sugar. These include high-calorie sports drinks, fruit juice that is not diluted, and soda. ? Caffeine. ? Foods that are greasy or contain a lot of fat or sugar. General instructions  Take over-the-counter and prescription medicines only as told by your health care provider.  Do not take sodium tablets. Doing that can lead to having too much sodium in the body (hypernatremia).  Return to your normal activities as told by your health care provider. Ask your health care provider what activities are safe for you.  Keep all follow-up visits as told by your health care provider. This is important. Contact a health care provider if:  You have muscle cramps, pain, or discomfort, such as: ? Pain in your abdomen and the pain gets worse or stays in one area (localizes). ? Stiff neck.  You have a rash.  You are more irritable than usual.  You are sleepier or have a harder time waking than usual.  You feel weak or dizzy.  You feel very thirsty. Get help right away if you have:  Any symptoms of severe dehydration.  Symptoms of vomiting, such as: ? You cannot eat or drink without vomiting. ? Vomiting gets worse or does not go away. ? Vomit includes blood or green matter (bile).  Symptoms that get worse with treatment.  A fever.  A severe headache.  Problems with urination or bowel movements, such as: ? Diarrhea that gets worse or does not go away. ? Blood in your stool (feces). This  may cause stool to look black and tarry. ? Not urinating, or urinating only a small amount of very dark urine, within 6-8 hours.  Trouble breathing. These symptoms may represent a serious problem that is an emergency. Do not wait to see if the symptoms will go away. Get medical help right away. Call your local emergency services (911 in the [place] Do not drive yourself to the hospital. Summary  Dehydration is a condition in which there is not enough water or other fluids in the body. This happens when a person loses more fluids than he or she takes in.  Treatment for this condition depends on how severe it is. Treatment should be started right away. Do not wait until dehydration becomes severe.  Drink enough clear fluid to keep your urine pale yellow. If you were told to drink an oral rehydration solution (ORS), finish the ORS first and then start slowly drinking other clear fluids.  Take over-the-counter and prescription medicines only as told by your health care   provider.  Get help right away if you have any symptoms of severe dehydration. This information is not intended to replace advice given to you by your health care provider. Make sure you discuss any questions you have with your health care provider. Document Revised: 04/21/2019 Document Reviewed: 04/21/2019 Elsevier Patient Education  2020 [employer]   

## 2020-08-30 LAB — URINE CULTURE: Culture: NO GROWTH

## 2020-08-31 ENCOUNTER — Inpatient Hospital Stay: Payer: Medicaid Other

## 2020-09-10 ENCOUNTER — Telehealth: Payer: Self-pay

## 2020-09-10 ENCOUNTER — Inpatient Hospital Stay: Payer: Medicaid Other

## 2020-09-10 ENCOUNTER — Other Ambulatory Visit: Payer: Self-pay

## 2020-09-10 ENCOUNTER — Inpatient Hospital Stay (HOSPITAL_BASED_OUTPATIENT_CLINIC_OR_DEPARTMENT_OTHER): Payer: Medicaid Other | Admitting: Hematology & Oncology

## 2020-09-10 ENCOUNTER — Encounter: Payer: Self-pay | Admitting: Hematology & Oncology

## 2020-09-10 VITALS — Wt 159.0 lb

## 2020-09-10 DIAGNOSIS — C772 Secondary and unspecified malignant neoplasm of intra-abdominal lymph nodes: Secondary | ICD-10-CM | POA: Diagnosis not present

## 2020-09-10 DIAGNOSIS — C187 Malignant neoplasm of sigmoid colon: Secondary | ICD-10-CM

## 2020-09-10 DIAGNOSIS — Z5111 Encounter for antineoplastic chemotherapy: Secondary | ICD-10-CM | POA: Diagnosis not present

## 2020-09-10 DIAGNOSIS — R300 Dysuria: Secondary | ICD-10-CM

## 2020-09-10 LAB — CBC WITH DIFFERENTIAL (CANCER CENTER ONLY)
Abs Immature Granulocytes: 0.33 10*3/uL — ABNORMAL HIGH (ref 0.00–0.07)
Basophils Absolute: 0.1 10*3/uL (ref 0.0–0.1)
Basophils Relative: 1 %
Eosinophils Absolute: 0.1 10*3/uL (ref 0.0–0.5)
Eosinophils Relative: 1 %
HCT: 37 % (ref 36.0–46.0)
Hemoglobin: 11.8 g/dL — ABNORMAL LOW (ref 12.0–15.0)
Immature Granulocytes: 3 %
Lymphocytes Relative: 23 %
Lymphs Abs: 2.4 10*3/uL (ref 0.7–4.0)
MCH: 32.2 pg (ref 26.0–34.0)
MCHC: 31.9 g/dL (ref 30.0–36.0)
MCV: 100.8 fL — ABNORMAL HIGH (ref 80.0–100.0)
Monocytes Absolute: 1 10*3/uL (ref 0.1–1.0)
Monocytes Relative: 10 %
Neutro Abs: 6.5 10*3/uL (ref 1.7–7.7)
Neutrophils Relative %: 62 %
Platelet Count: 219 10*3/uL (ref 150–400)
RBC: 3.67 MIL/uL — ABNORMAL LOW (ref 3.87–5.11)
RDW: 19.1 % — ABNORMAL HIGH (ref 11.5–15.5)
WBC Count: 10.4 10*3/uL (ref 4.0–10.5)
nRBC: 0 % (ref 0.0–0.2)

## 2020-09-10 LAB — CMP (CANCER CENTER ONLY)
ALT: 15 U/L (ref 0–44)
AST: 16 U/L (ref 15–41)
Albumin: 3.6 g/dL (ref 3.5–5.0)
Alkaline Phosphatase: 41 U/L (ref 38–126)
Anion gap: 9 (ref 5–15)
BUN: 12 mg/dL (ref 8–23)
CO2: 24 mmol/L (ref 22–32)
Calcium: 9.1 mg/dL (ref 8.9–10.3)
Chloride: 102 mmol/L (ref 98–111)
Creatinine: 0.57 mg/dL (ref 0.44–1.00)
GFR, Estimated: >60 mL/min (ref >60)
Glucose, Bld: 216 mg/dL — ABNORMAL HIGH (ref 70–99)
Potassium: 3.9 mmol/L (ref 3.5–5.1)
Sodium: 135 mmol/L (ref 135–145)
Total Bilirubin: 0.2 mg/dL — ABNORMAL LOW (ref 0.3–1.2)
Total Protein: 6.5 g/dL (ref 6.5–8.1)

## 2020-09-10 LAB — LACTATE DEHYDROGENASE: LDH: 216 U/L — ABNORMAL HIGH (ref 98–192)

## 2020-09-10 MED ORDER — PALONOSETRON HCL INJECTION 0.25 MG/5ML
0.2500 mg | Freq: Once | INTRAVENOUS | Status: AC
  Administered 2020-09-10: 0.25 mg via INTRAVENOUS

## 2020-09-10 MED ORDER — DEXTROSE 5 % IV SOLN
Freq: Once | INTRAVENOUS | Status: AC
  Filled 2020-09-10: qty 250

## 2020-09-10 MED ORDER — LEUCOVORIN CALCIUM INJECTION 350 MG
400.0000 mg/m2 | Freq: Once | INTRAVENOUS | Status: AC
  Administered 2020-09-10: 684 mg via INTRAVENOUS
  Filled 2020-09-10: qty 34.2

## 2020-09-10 MED ORDER — SODIUM CHLORIDE 0.9 % IV SOLN
1920.0000 mg/m2 | INTRAVENOUS | Status: DC
  Administered 2020-09-10: 3300 mg via INTRAVENOUS
  Filled 2020-09-10: qty 66

## 2020-09-10 MED ORDER — OXALIPLATIN CHEMO INJECTION 100 MG/20ML
68.0000 mg/m2 | Freq: Once | INTRAVENOUS | Status: AC
  Administered 2020-09-10: 115 mg via INTRAVENOUS
  Filled 2020-09-10: qty 20

## 2020-09-10 MED ORDER — SODIUM CHLORIDE 0.9 % IV SOLN
10.0000 mg | Freq: Once | INTRAVENOUS | Status: AC
  Administered 2020-09-10: 10 mg via INTRAVENOUS
  Filled 2020-09-10: qty 10

## 2020-09-10 MED ORDER — PALONOSETRON HCL INJECTION 0.25 MG/5ML
INTRAVENOUS | Status: AC
  Filled 2020-09-10: qty 5

## 2020-09-10 MED ORDER — FLUOROURACIL CHEMO INJECTION 2.5 GM/50ML
320.0000 mg/m2 | Freq: Once | INTRAVENOUS | Status: AC
  Administered 2020-09-10: 550 mg via INTRAVENOUS
  Filled 2020-09-10: qty 11

## 2020-09-10 NOTE — Telephone Encounter (Signed)
appts moved from 09/26/2020 to 10/01/20 per 09/10/20 ,os, pt will rec updated sch/avs in tx today.... AOM

## 2020-09-10 NOTE — Progress Notes (Signed)
Pt discharged in no apparent distress. Pt left ambulatory without assistance. Pt aware of discharge instructions and verbalized understanding and had no further questions.  

## 2020-09-10 NOTE — Patient Instructions (Signed)

## 2020-09-10 NOTE — Progress Notes (Signed)
Hematology and Oncology Follow Up Visit  Bailey Walker [id number] 1946/03/02 74 y.o. 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  She is doing much better.  She feels better.  We did not find any problems with urinary tract infections.  She is eating more now.  She is at the cooking.  Her colostomy is working well.  The abdominal wound is still healing up.  It is almost fully healed.  She has had no problems with bleeding.  There has been no issues with cough or shortness of breath.  She has had no leg swelling.  Her last CEA level couple weeks ago was 2.55.  Currently, her performance status is ECOG 1.    Medications:  Allergies as of 09/10/2020      Reactions   Nitroglycerin Nausea And Vomiting   Ampicillin Other (See Comments)   Per allergy test      Medication List       Accurate as of September 10, 2020 10:37 AM. If you have any questions, ask your nurse or doctor.        alendronate 70 MG tablet Commonly known as: FOSAMAX Take 70 mg by mouth every Sunday.   aspirin EC 81 MG tablet Take 81 mg by mouth daily. Swallow whole.   denosumab 60 MG/ML Sosy injection Commonly known as: PROLIA Inject 60 mg into the skin every 6 (six) months.   dexamethasone 4 MG tablet Commonly known as: DECADRON Take 2 tablets (8 mg total) by mouth daily. Start the day after chemotherapy for 2 days. Take with food.   Dexilant 30 MG capsule Generic drug: Dexlansoprazole TAKE 1 CAPSULE BY MOUTH EVERY DAY What changed: how much to take   docusate sodium 100 MG capsule Commonly known as: COLACE Take 1 capsule (100 mg total) by mouth 2 (two) times daily.   doxycycline 100 MG  tablet Commonly known as: VIBRA-TABS Take 1 tablet (100 mg total) by mouth 2 (two) times daily.   feeding supplement Liqd Take 237 mLs by mouth 3 (three) times daily between meals.   gabapentin 100 MG capsule Commonly known as: NEURONTIN Take 100 mg by mouth 3 (three) times daily.   glipiZIDE 5 MG tablet Commonly known as: GLUCOTROL TAKE 1 TABLET BY MOUTH TWICE A DAY BEFORE MEALS What changed: See the new instructions.   levothyroxine 75 MCG tablet Commonly known as: SYNTHROID TAKE 1 TABLET BY MOUTH EVERY DAY What changed: when to take this   lidocaine-prilocaine cream Commonly known as: EMLA Apply to affected area once   metFORMIN 500 MG tablet Commonly known as: GLUCOPHAGE Take 0.5 tablets (250 mg total) by mouth 2 (two) times daily with a meal. What changed:   how much to take  when to take this  additional instructions   methocarbamol 500 MG tablet Commonly known as: ROBAXIN Take 1 tablet (500 mg total) by mouth every 12 (twelve) hours as needed for muscle spasms.   metoprolol tartrate 25 MG tablet Commonly known as: LOPRESSOR Take 1 tablet (25 mg total) by mouth 2 (two) times daily.   multivitamin with minerals Tabs tablet Take 1 tablet by mouth daily.   mupirocin ointment 2 % Commonly known as:  BACTROBAN Apply 1 application topically 2 (two) times daily.   nitroGLYCERIN 0.4 MG SL tablet Commonly known as: NITROSTAT Place 1 tablet (0.4 mg total) under the tongue every 5 (five) minutes as needed for chest pain.   nystatin powder Commonly known as: MYCOSTATIN/NYSTOP Apply topically.   ondansetron 4 MG tablet Commonly known as: ZOFRAN Take 1 tablet (4 mg total) by mouth every 6 (six) hours as needed for nausea.   ondansetron 8 MG tablet Commonly known as: Zofran Take 1 tablet (8 mg total) by mouth 2 (two) times daily as needed for refractory nausea / vomiting. Start on day 3 after chemotherapy.   prednisoLONE acetate 1 % ophthalmic  suspension Commonly known as: PRED FORTE Place 1 drop into both eyes See admin instructions. Instill one drop into both eyes on Monday, Wednesday, Friday nights   prochlorperazine 10 MG tablet Commonly known as: COMPAZINE Take 1 tablet (10 mg total) by mouth every 6 (six) hours as needed (Nausea or vomiting).   traMADol 50 MG tablet Commonly known as: ULTRAM Take 50 mg by mouth every 6 (six) hours as needed.       Allergies:  Allergies  Allergen Reactions  . Nitroglycerin Nausea And Vomiting  . Ampicillin Other (See Comments)    Per allergy test    Past Medical History, Surgical history, Social history, and Family History were reviewed and updated.  Review of Systems: Review of Systems  Constitutional: Negative.   HENT: Negative.   Eyes: Negative.   Respiratory: Negative.   Cardiovascular: Negative.   Gastrointestinal: Positive for abdominal pain.  Genitourinary: Positive for dysuria and hematuria.  Musculoskeletal: Positive for joint pain.  Skin: Negative.   Neurological: Negative.   Endo/Heme/Allergies: Negative.      Physical Exam:  weight is 159 lb (72.1 kg).   Wt Readings from Last 3 Encounters:  09/10/20 159 lb (72.1 kg)  08/29/20 153 lb (69.4 kg)  08/13/20 155 lb (70.3 kg)    Physical Exam Vitals reviewed.  HENT:     Head: Normocephalic and atraumatic.  Eyes:     Pupils: Pupils are equal, round, and reactive to light.  Cardiovascular:     Rate and Rhythm: Normal rate and regular rhythm.     Heart sounds: Normal heart sounds.  Pulmonary:     Effort: Pulmonary effort is normal.     Breath sounds: Normal breath sounds.  Abdominal:     General: Bowel sounds are normal.     Palpations: Abdomen is soft.     Comments: Abdominal exam shows a slightly distended abdomen.  Bowel sounds are present.  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  Her abdominal laparotomy wound is dressed.  There is some slight discomfort with palpation.  She has  hypertympany to percussion.  There is no obvious abdominal mass.  There is no fluid wave.  Is no palpable liver or spleen tip.  Musculoskeletal:        General: No tenderness or deformity. Normal range of motion.     Cervical back: Normal range of motion.     Comments: Around the big toe of the left foot, there is erythema and swelling.  There is some tenderness to palpation.  Lymphadenopathy:     Cervical: No cervical adenopathy.  Skin:    General: Skin is warm and dry.     Findings: No erythema or rash.  Neurological:     Mental Status: She is alert and oriented to person, place, and time.  Psychiatric:        Behavior: Behavior normal.        Thought Content: Thought content normal.        Judgment: Judgment normal.       Lab Results  Component Value Date   WBC 10.4 09/10/2020   HGB 11.8 (L) 09/10/2020   HCT 37.0 09/10/2020   MCV 100.8 (H) 09/10/2020   PLT 219 09/10/2020   Lab Results  Component Value Date   FERRITIN 96 07/04/2020   IRON 55 07/04/2020   TIBC 335 07/04/2020   UIBC 280 07/04/2020   IRONPCTSAT 17 (L) 07/04/2020   Lab Results  Component Value Date   RETICCTPCT 1.9 01/05/2020   RBC 3.67 (L) 09/10/2020   No results found for: KPAFRELGTCHN, LAMBDASER, KAPLAMBRATIO No results found for: IGGSERUM, IGA, IGMSERUM No results found for: Odetta Pink, SPEI   Chemistry      Component Value Date/Time   NA 138 08/29/2020 0905   NA 139 07/12/2019 1110   K 3.3 (L) 08/29/2020 0905   CL 103 08/29/2020 0905   CO2 24 08/29/2020 0905   BUN 10 08/29/2020 0905   BUN 15 07/12/2019 1110   CREATININE 0.62 08/29/2020 0905   CREATININE 0.74 01/08/2017 1100      Component Value Date/Time   CALCIUM 8.9 08/29/2020 0905   ALKPHOS 40 08/29/2020 0905   AST 23 08/29/2020 0905   ALT 20 08/29/2020 0905   BILITOT 0.3 08/29/2020 0905       Impression and Plan: Ms. 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   I am going to go ahead and treat her today.  I think everything looks quite good.  I am glad that she is feeling better.  After today, we will have 2 more treatments left.  Then hopefully, she will be able to have the colostomy reversed.  It sounds like they will be going up to [place] for New Year's Eve.  They do this every year and have a wonderful time.  We will plan to get her back to see [place] in 3 weeks.      Volanda Napoleon, MD 12/20/202110:37 AM

## 2020-09-10 NOTE — Progress Notes (Signed)
Pt. Accompanied by daughter who interprets for her.

## 2020-09-11 LAB — CEA (IN HOUSE-CHCC): CEA (CHCC-In House): 3.55 ng/mL (ref 0.00–5.00)

## 2020-09-12 ENCOUNTER — Inpatient Hospital Stay: Payer: Medicaid Other

## 2020-09-12 DIAGNOSIS — C187 Malignant neoplasm of sigmoid colon: Secondary | ICD-10-CM

## 2020-09-12 DIAGNOSIS — C772 Secondary and unspecified malignant neoplasm of intra-abdominal lymph nodes: Secondary | ICD-10-CM

## 2020-09-12 DIAGNOSIS — Z5111 Encounter for antineoplastic chemotherapy: Secondary | ICD-10-CM | POA: Diagnosis not present

## 2020-09-12 MED ORDER — HEPARIN SOD (PORK) LOCK FLUSH 100 UNIT/ML IV SOLN
500.0000 [IU] | Freq: Once | INTRAVENOUS | Status: AC | PRN
  Administered 2020-09-12: 500 [IU]
  Filled 2020-09-12: qty 5

## 2020-09-12 MED ORDER — SODIUM CHLORIDE 0.9% FLUSH
10.0000 mL | INTRAVENOUS | Status: DC | PRN
  Administered 2020-09-12: 10 mL
  Filled 2020-09-12: qty 10

## 2020-09-12 NOTE — Patient Instructions (Signed)
Implanted Port Insertion, Care After °This sheet gives you information about how to care for yourself after your procedure. Your health care provider may also give you more specific instructions. If you have problems or questions, contact your health care provider. °What can I expect after the procedure? °After the procedure, it is common to have: °· Discomfort at the port insertion site. °· Bruising on the skin over the port. This should improve over 3-4 days. °Follow these instructions at home: °Port care °· After your port is placed, you will get a manufacturer's information card. The card has information about your port. Keep this card with you at all times. °· Take care of the port as told by your health care provider. Ask your health care provider if you or a family member can get training for taking care of the port at home. A home health care nurse may also take care of the port. °· Make sure to remember what type of port you have. °Incision care ° °  ° °· Follow instructions from your health care provider about how to take care of your port insertion site. Make sure you: °? Wash your hands with soap and water before and after you change your bandage (dressing). If soap and water are not available, use hand sanitizer. °? Change your dressing as told by your health care provider. °? Leave stitches (sutures), skin glue, or adhesive strips in place. These skin closures may need to stay in place for 2 weeks or longer. If adhesive strip edges start to loosen and curl up, you may trim the loose edges. Do not remove adhesive strips completely unless your health care provider tells you to do that. °· Check your port insertion site every day for signs of infection. Check for: °? Redness, swelling, or pain. °? Fluid or blood. °? Warmth. °? Pus or a bad smell. °Activity °· Return to your normal activities as told by your health care provider. Ask your health care provider what activities are safe for you. °· Do not  lift anything that is heavier than 10 lb (4.5 kg), or the limit that you are told, until your health care provider says that it is safe. °General instructions °· Take over-the-counter and prescription medicines only as told by your health care provider. °· Do not take baths, swim, or use a hot tub until your health care provider approves. Ask your health care provider if you may take showers. You may only be allowed to take sponge baths. °· Do not drive for 24 hours if you were given a sedative during your procedure. °· Wear a medical alert bracelet in case of an emergency. This will tell any health care providers that you have a port. °· Keep all follow-up visits as told by your health care provider. This is important. °Contact a health care provider if: °· You cannot flush your port with saline as directed, or you cannot draw blood from the port. °· You have a fever or chills. °· You have redness, swelling, or pain around your port insertion site. °· You have fluid or blood coming from your port insertion site. °· Your port insertion site feels warm to the touch. °· You have pus or a bad smell coming from the port insertion site. °Get help right away if: °· You have chest pain or shortness of breath. °· You have bleeding from your port that you cannot control. °Summary °· Take care of the port as told by your health   care provider. Keep the manufacturer's information card with you at all times. °· Change your dressing as told by your health care provider. °· Contact a health care provider if you have a fever or chills or if you have redness, swelling, or pain around your port insertion site. °· Keep all follow-up visits as told by your health care provider. °This information is not intended to replace advice given to you by your health care provider. Make sure you discuss any questions you have with your health care provider. °Document Revised: 04/06/2018 Document Reviewed: 04/06/2018 °Elsevier Patient Education ©  2020 [employer] ° °

## 2020-09-26 ENCOUNTER — Other Ambulatory Visit: Payer: Medicaid Other

## 2020-09-26 ENCOUNTER — Ambulatory Visit: Payer: Medicaid Other

## 2020-09-26 ENCOUNTER — Ambulatory Visit: Payer: Medicaid Other | Admitting: Family

## 2020-10-01 ENCOUNTER — Other Ambulatory Visit: Payer: Self-pay

## 2020-10-01 ENCOUNTER — Telehealth: Payer: Self-pay | Admitting: Family

## 2020-10-01 ENCOUNTER — Inpatient Hospital Stay (HOSPITAL_BASED_OUTPATIENT_CLINIC_OR_DEPARTMENT_OTHER): Payer: Medicaid Other | Admitting: Family

## 2020-10-01 ENCOUNTER — Inpatient Hospital Stay: Payer: Medicaid Other | Attending: Hematology and Oncology

## 2020-10-01 ENCOUNTER — Inpatient Hospital Stay: Payer: Medicaid Other

## 2020-10-01 ENCOUNTER — Encounter: Payer: Self-pay | Admitting: Family

## 2020-10-01 VITALS — BP 143/64 | HR 81 | Temp 98.7°F | Resp 18 | Ht 60.0 in | Wt 165.0 lb

## 2020-10-01 DIAGNOSIS — Z79899 Other long term (current) drug therapy: Secondary | ICD-10-CM | POA: Diagnosis not present

## 2020-10-01 DIAGNOSIS — C772 Secondary and unspecified malignant neoplasm of intra-abdominal lymph nodes: Secondary | ICD-10-CM

## 2020-10-01 DIAGNOSIS — Z7982 Long term (current) use of aspirin: Secondary | ICD-10-CM | POA: Diagnosis not present

## 2020-10-01 DIAGNOSIS — D510 Vitamin B12 deficiency anemia due to intrinsic factor deficiency: Secondary | ICD-10-CM | POA: Insufficient documentation

## 2020-10-01 DIAGNOSIS — Z7984 Long term (current) use of oral hypoglycemic drugs: Secondary | ICD-10-CM | POA: Insufficient documentation

## 2020-10-01 DIAGNOSIS — C187 Malignant neoplasm of sigmoid colon: Secondary | ICD-10-CM

## 2020-10-01 DIAGNOSIS — Z5111 Encounter for antineoplastic chemotherapy: Secondary | ICD-10-CM | POA: Diagnosis not present

## 2020-10-01 LAB — CMP (CANCER CENTER ONLY)
ALT: 21 U/L (ref 0–44)
AST: 30 U/L (ref 15–41)
Albumin: 3.7 g/dL (ref 3.5–5.0)
Alkaline Phosphatase: 42 U/L (ref 38–126)
Anion gap: 9 (ref 5–15)
BUN: 21 mg/dL (ref 8–23)
CO2: 26 mmol/L (ref 22–32)
Calcium: 9.1 mg/dL (ref 8.9–10.3)
Chloride: 100 mmol/L (ref 98–111)
Creatinine: 0.62 mg/dL (ref 0.44–1.00)
GFR, Estimated: >60 mL/min (ref >60)
Glucose, Bld: 192 mg/dL — ABNORMAL HIGH (ref 70–99)
Potassium: 4 mmol/L (ref 3.5–5.1)
Sodium: 135 mmol/L (ref 135–145)
Total Bilirubin: 0.5 mg/dL (ref 0.3–1.2)
Total Protein: 7 g/dL (ref 6.5–8.1)

## 2020-10-01 LAB — CBC WITH DIFFERENTIAL (CANCER CENTER ONLY)
Abs Immature Granulocytes: 0.12 10*3/uL — ABNORMAL HIGH (ref 0.00–0.07)
Basophils Absolute: 0.1 10*3/uL (ref 0.0–0.1)
Basophils Relative: 1 %
Eosinophils Absolute: 0.3 10*3/uL (ref 0.0–0.5)
Eosinophils Relative: 4 %
HCT: 37.6 % (ref 36.0–46.0)
Hemoglobin: 12.1 g/dL (ref 12.0–15.0)
Immature Granulocytes: 2 %
Lymphocytes Relative: 41 %
Lymphs Abs: 2.4 10*3/uL (ref 0.7–4.0)
MCH: 32.9 pg (ref 26.0–34.0)
MCHC: 32.2 g/dL (ref 30.0–36.0)
MCV: 102.2 fL — ABNORMAL HIGH (ref 80.0–100.0)
Monocytes Absolute: 1 10*3/uL (ref 0.1–1.0)
Monocytes Relative: 17 %
Neutro Abs: 2.1 10*3/uL (ref 1.7–7.7)
Neutrophils Relative %: 35 %
Platelet Count: 259 10*3/uL (ref 150–400)
RBC: 3.68 MIL/uL — ABNORMAL LOW (ref 3.87–5.11)
RDW: 16.2 % — ABNORMAL HIGH (ref 11.5–15.5)
WBC Count: 5.9 10*3/uL (ref 4.0–10.5)
nRBC: 0 % (ref 0.0–0.2)

## 2020-10-01 MED ORDER — OXALIPLATIN CHEMO INJECTION 100 MG/20ML
68.0000 mg/m2 | Freq: Once | INTRAVENOUS | Status: AC
  Administered 2020-10-01: 115 mg via INTRAVENOUS
  Filled 2020-10-01: qty 20

## 2020-10-01 MED ORDER — FLUOROURACIL CHEMO INJECTION 2.5 GM/50ML
320.0000 mg/m2 | Freq: Once | INTRAVENOUS | Status: AC
  Administered 2020-10-01: 550 mg via INTRAVENOUS
  Filled 2020-10-01: qty 11

## 2020-10-01 MED ORDER — DEXTROSE 5 % IV SOLN
Freq: Once | INTRAVENOUS | Status: AC
  Filled 2020-10-01: qty 250

## 2020-10-01 MED ORDER — PALONOSETRON HCL INJECTION 0.25 MG/5ML
0.2500 mg | Freq: Once | INTRAVENOUS | Status: AC
  Administered 2020-10-01: 0.25 mg via INTRAVENOUS

## 2020-10-01 MED ORDER — SODIUM CHLORIDE 0.9 % IV SOLN
1920.0000 mg/m2 | INTRAVENOUS | Status: DC
  Administered 2020-10-01: 3300 mg via INTRAVENOUS
  Filled 2020-10-01: qty 66

## 2020-10-01 MED ORDER — LEUCOVORIN CALCIUM INJECTION 350 MG
400.0000 mg/m2 | Freq: Once | INTRAVENOUS | Status: AC
  Administered 2020-10-01: 684 mg via INTRAVENOUS
  Filled 2020-10-01: qty 34.2

## 2020-10-01 MED ORDER — PALONOSETRON HCL INJECTION 0.25 MG/5ML
INTRAVENOUS | Status: AC
  Filled 2020-10-01: qty 5

## 2020-10-01 MED ORDER — SODIUM CHLORIDE 0.9 % IV SOLN
10.0000 mg | Freq: Once | INTRAVENOUS | Status: AC
  Administered 2020-10-01: 10 mg via INTRAVENOUS
  Filled 2020-10-01: qty 10

## 2020-10-01 NOTE — Progress Notes (Signed)
Hematology and Oncology Follow Up Visit  Bailey Walker [id number] 1945-11-02 75 y.o. 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 is here today with her niece who is her interpretor for follow-up and treatment. She is doing well and her abdominal incision continues to heal. She still has a small section that has not closed. They are keeping it clean and covered with bandage. The wound is clean dry and intact on exam. No redness or edema to suggest infection.  CEA last month was 3.55.  No fever, chills, n/v, cough, rash, dizziness, chest pain, palpitations, abdominal pain or changes in bowel or bladder habits.  Ostomy is functioning appropriately.  No blood loss. No abnormal bruising, no petechia.  She has SOB with over exertion secondary to COPD. She was previously a smoker.  She has neuropathy in her hands and feet. She notes sensitivity to hot and cold for a little over a week after each treatment. This has been tolerable for her so far.   No swelling noted in her extremities.  No falls or syncopal episodes to report.  She has maintained a good appetite and is staying well hydrated. Her weight is stable at 165 lbs.    ECOG Performance Status: 1 - Symptomatic but completely ambulatory  Medications:  Allergies as of 10/01/2020      Reactions   Nitroglycerin Nausea And Vomiting   Ampicillin Other (See Comments)   Per allergy test      Medication List       Accurate as of October 01, 2020 10:37 AM. If you have any questions, ask your nurse or doctor.        alendronate 70 MG tablet Commonly known as: FOSAMAX Take 70 mg by mouth every Sunday.   aspirin EC 81 MG tablet Take 81 mg by mouth daily. Swallow whole.   denosumab 60 MG/ML  Sosy injection Commonly known as: PROLIA Inject 60 mg into the skin every 6 (six) months.   dexamethasone 4 MG tablet Commonly known as: DECADRON Take 2 tablets (8 mg total) by mouth daily. Start the day after chemotherapy for 2 days. Take with food.   Dexilant 30 MG capsule Generic drug: Dexlansoprazole TAKE 1 CAPSULE BY MOUTH EVERY DAY What changed: how much to take   docusate sodium 100 MG capsule Commonly known as: COLACE Take 1 capsule (100 mg total) by mouth 2 (two) times daily.   doxycycline 100 MG tablet Commonly known as: VIBRA-TABS Take 1 tablet (100 mg total) by mouth 2 (two) times daily.   feeding supplement Liqd Take 237 mLs by mouth 3 (three) times daily between meals.   gabapentin 100 MG capsule Commonly known as: NEURONTIN Take 100 mg by mouth 3 (three) times daily.   glipiZIDE 5 MG tablet Commonly known as: GLUCOTROL TAKE 1 TABLET BY MOUTH TWICE A DAY BEFORE MEALS What changed: See the new instructions.   levothyroxine 75 MCG tablet Commonly known as: SYNTHROID TAKE 1 TABLET BY MOUTH EVERY DAY What changed: when to take this   lidocaine-prilocaine cream Commonly known as: EMLA Apply to affected area once   metFORMIN 500 MG tablet Commonly known as: GLUCOPHAGE Take 0.5 tablets (250 mg total) by mouth 2 (two) times daily with a meal. What changed:   how  much to take  when to take this  additional instructions   methocarbamol 500 MG tablet Commonly known as: ROBAXIN Take 1 tablet (500 mg total) by mouth every 12 (twelve) hours as needed for muscle spasms.   metoprolol tartrate 25 MG tablet Commonly known as: LOPRESSOR Take 1 tablet (25 mg total) by mouth 2 (two) times daily.   multivitamin with minerals Tabs tablet Take 1 tablet by mouth daily.   mupirocin ointment 2 % Commonly known as: BACTROBAN Apply 1 application topically 2 (two) times daily.   nitroGLYCERIN 0.4 MG SL tablet Commonly known as: NITROSTAT Place 1 tablet (0.4 mg  total) under the tongue every 5 (five) minutes as needed for chest pain.   nystatin powder Commonly known as: MYCOSTATIN/NYSTOP Apply topically.   ondansetron 4 MG tablet Commonly known as: ZOFRAN Take 1 tablet (4 mg total) by mouth every 6 (six) hours as needed for nausea.   ondansetron 8 MG tablet Commonly known as: Zofran Take 1 tablet (8 mg total) by mouth 2 (two) times daily as needed for refractory nausea / vomiting. Start on day 3 after chemotherapy.   prednisoLONE acetate 1 % ophthalmic suspension Commonly known as: PRED FORTE Place 1 drop into both eyes See admin instructions. Instill one drop into both eyes on Monday, Wednesday, Friday nights   prochlorperazine 10 MG tablet Commonly known as: COMPAZINE Take 1 tablet (10 mg total) by mouth every 6 (six) hours as needed (Nausea or vomiting).   traMADol 50 MG tablet Commonly known as: ULTRAM Take 50 mg by mouth every 6 (six) hours as needed.       Allergies:  Allergies  Allergen Reactions  . Nitroglycerin Nausea And Vomiting  . Ampicillin Other (See Comments)    Per allergy test    Past Medical History, Surgical history, Social history, and Family History were reviewed and updated.  Review of Systems: All other 10 point review of systems is negative.   Physical Exam:  vitals were not taken for this visit.   Wt Readings from Last 3 Encounters:  09/10/20 159 lb (72.1 kg)  08/29/20 153 lb (69.4 kg)  08/13/20 155 lb (70.3 kg)    Ocular: Sclerae unicteric, pupils equal, round and reactive to light Ear-nose-throat: Oropharynx clear, dentition fair Lymphatic: No cervical or supraclavicular adenopathy Lungs no rales or rhonchi, good excursion bilaterally Heart regular rate and rhythm, no murmur appreciated Abd soft, nontender, positive bowel sounds MSK no focal spinal tenderness, no joint edema Neuro: non-focal, well-oriented, appropriate affect Breasts: Deferred   Lab Results  Component Value Date   WBC  5.9 10/01/2020   HGB 12.1 10/01/2020   HCT 37.6 10/01/2020   MCV 102.2 (H) 10/01/2020   PLT 259 10/01/2020   Lab Results  Component Value Date   FERRITIN 96 07/04/2020   IRON 55 07/04/2020   TIBC 335 07/04/2020   UIBC 280 07/04/2020   IRONPCTSAT 17 (L) 07/04/2020   Lab Results  Component Value Date   RETICCTPCT 1.9 01/05/2020   RBC 3.68 (L) 10/01/2020   No results found for: KPAFRELGTCHN, LAMBDASER, KAPLAMBRATIO No results found for: IGGSERUM, IGA, IGMSERUM No results found for: Odetta Pink, SPEI   Chemistry      Component Value Date/Time   NA 135 09/10/2020 0945   NA 139 07/12/2019 1110   K 3.9 09/10/2020 0945   CL 102 09/10/2020 0945   CO2 24 09/10/2020 0945   BUN 12 09/10/2020 0945   BUN  15 07/12/2019 1110   CREATININE 0.57 09/10/2020 0945   CREATININE 0.74 01/08/2017 1100      Component Value Date/Time   CALCIUM 9.1 09/10/2020 0945   ALKPHOS 41 09/10/2020 0945   AST 16 09/10/2020 0945   ALT 15 09/10/2020 0945   BILITOT 0.2 (L) 09/10/2020 0945       Impression and Plan: Ms. 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  She continues to do well with treatment. We will proceed with cycle 7 today as planned.  After cycle 8 we will re-evaluate CT scans and discuss potential ostomy reversal.  Follow-up with MD in 2 weeks.  They were encouraged to contact our office with any questions or concerns.   Laverna Peace, NP 1/10/202210:37 AM

## 2020-10-01 NOTE — Telephone Encounter (Signed)
Appointments scheduled patient will get AVS from infusion today after appt per 1/10 los

## 2020-10-01 NOTE — Patient Instructions (Signed)

## 2020-10-01 NOTE — Addendum Note (Signed)
Addended by: Burney Gauze R on: 10/01/2020 11:31 AM   Modules accepted: Orders

## 2020-10-02 LAB — CEA (IN HOUSE-CHCC): CEA (CHCC-In House): 3.5 ng/mL (ref 0.00–5.00)

## 2020-10-03 ENCOUNTER — Other Ambulatory Visit: Payer: Self-pay

## 2020-10-03 ENCOUNTER — Inpatient Hospital Stay: Payer: Medicaid Other

## 2020-10-03 VITALS — BP 146/64 | HR 98 | Temp 98.1°F | Resp 18

## 2020-10-03 DIAGNOSIS — Z5111 Encounter for antineoplastic chemotherapy: Secondary | ICD-10-CM | POA: Diagnosis not present

## 2020-10-03 DIAGNOSIS — C187 Malignant neoplasm of sigmoid colon: Secondary | ICD-10-CM

## 2020-10-03 MED ORDER — HEPARIN SOD (PORK) LOCK FLUSH 100 UNIT/ML IV SOLN
500.0000 [IU] | Freq: Once | INTRAVENOUS | Status: AC | PRN
  Administered 2020-10-03: 500 [IU]
  Filled 2020-10-03: qty 5

## 2020-10-03 MED ORDER — SODIUM CHLORIDE 0.9% FLUSH
10.0000 mL | INTRAVENOUS | Status: DC | PRN
  Administered 2020-10-03: 10 mL
  Filled 2020-10-03: qty 10

## 2020-10-03 NOTE — Patient Instructions (Signed)

## 2020-10-09 ENCOUNTER — Telehealth: Payer: Self-pay

## 2020-10-09 NOTE — Telephone Encounter (Signed)
S/w sando  ra per secure chat message and r/s her appts per tx plan to 1-31 from 1-25, she is aware and states she will also pull them from my chart    aom

## 2020-10-16 ENCOUNTER — Ambulatory Visit: Payer: Medicaid Other | Admitting: Hematology & Oncology

## 2020-10-16 ENCOUNTER — Telehealth: Payer: Self-pay | Admitting: *Deleted

## 2020-10-16 ENCOUNTER — Ambulatory Visit: Payer: Medicaid Other

## 2020-10-16 ENCOUNTER — Other Ambulatory Visit: Payer: Self-pay | Admitting: *Deleted

## 2020-10-16 ENCOUNTER — Other Ambulatory Visit: Payer: Medicaid Other

## 2020-10-16 DIAGNOSIS — C187 Malignant neoplasm of sigmoid colon: Secondary | ICD-10-CM

## 2020-10-16 MED ORDER — DEXAMETHASONE 4 MG PO TABS: 8.0000 mg | ORAL_TABLET | Freq: Every day | ORAL | 1 refills | Status: DC

## 2020-10-16 NOTE — Telephone Encounter (Signed)
Call received from patient's daughter, [name] stating that she is going out of the country and that contact for patient will now be her sister, Marcelo Baldy at [phone].

## 2020-10-22 ENCOUNTER — Inpatient Hospital Stay: Payer: Medicaid Other

## 2020-10-22 ENCOUNTER — Encounter: Payer: Self-pay | Admitting: Hematology & Oncology

## 2020-10-22 ENCOUNTER — Other Ambulatory Visit: Payer: Self-pay

## 2020-10-22 ENCOUNTER — Inpatient Hospital Stay (HOSPITAL_BASED_OUTPATIENT_CLINIC_OR_DEPARTMENT_OTHER): Payer: Medicaid Other | Admitting: Hematology & Oncology

## 2020-10-22 VITALS — BP 131/74 | HR 73 | Temp 98.2°F | Resp 18 | Wt 165.0 lb

## 2020-10-22 DIAGNOSIS — C772 Secondary and unspecified malignant neoplasm of intra-abdominal lymph nodes: Secondary | ICD-10-CM

## 2020-10-22 DIAGNOSIS — Z5111 Encounter for antineoplastic chemotherapy: Secondary | ICD-10-CM | POA: Diagnosis not present

## 2020-10-22 DIAGNOSIS — C187 Malignant neoplasm of sigmoid colon: Secondary | ICD-10-CM

## 2020-10-22 LAB — CMP (CANCER CENTER ONLY)
ALT: 17 U/L (ref 0–44)
AST: 20 U/L (ref 15–41)
Albumin: 4.1 g/dL (ref 3.5–5.0)
Alkaline Phosphatase: 39 U/L (ref 38–126)
Anion gap: 11 (ref 5–15)
BUN: 17 mg/dL (ref 8–23)
CO2: 26 mmol/L (ref 22–32)
Calcium: 9.6 mg/dL (ref 8.9–10.3)
Chloride: 100 mmol/L (ref 98–111)
Creatinine: 0.64 mg/dL (ref 0.44–1.00)
GFR, Estimated: >60 mL/min (ref >60)
Glucose, Bld: 130 mg/dL — ABNORMAL HIGH (ref 70–99)
Potassium: 4.1 mmol/L (ref 3.5–5.1)
Sodium: 137 mmol/L (ref 135–145)
Total Bilirubin: 0.3 mg/dL (ref 0.3–1.2)
Total Protein: 7 g/dL (ref 6.5–8.1)

## 2020-10-22 LAB — CBC WITH DIFFERENTIAL (CANCER CENTER ONLY)
Abs Immature Granulocytes: 0.05 10*3/uL (ref 0.00–0.07)
Basophils Absolute: 0.1 10*3/uL (ref 0.0–0.1)
Basophils Relative: 1 %
Eosinophils Absolute: 0.2 10*3/uL (ref 0.0–0.5)
Eosinophils Relative: 3 %
HCT: 38.3 % (ref 36.0–46.0)
Hemoglobin: 12.6 g/dL (ref 12.0–15.0)
Immature Granulocytes: 1 %
Lymphocytes Relative: 37 %
Lymphs Abs: 2.2 10*3/uL (ref 0.7–4.0)
MCH: 33.5 pg (ref 26.0–34.0)
MCHC: 32.9 g/dL (ref 30.0–36.0)
MCV: 101.9 fL — ABNORMAL HIGH (ref 80.0–100.0)
Monocytes Absolute: 1.2 10*3/uL — ABNORMAL HIGH (ref 0.1–1.0)
Monocytes Relative: 20 %
Neutro Abs: 2.2 10*3/uL (ref 1.7–7.7)
Neutrophils Relative %: 38 %
Platelet Count: 246 10*3/uL (ref 150–400)
RBC: 3.76 MIL/uL — ABNORMAL LOW (ref 3.87–5.11)
RDW: 14.5 % (ref 11.5–15.5)
WBC Count: 5.9 10*3/uL (ref 4.0–10.5)
nRBC: 0 % (ref 0.0–0.2)

## 2020-10-22 LAB — CEA (IN HOUSE-CHCC): CEA (CHCC-In House): 2.97 ng/mL (ref 0.00–5.00)

## 2020-10-22 MED ORDER — PALONOSETRON HCL INJECTION 0.25 MG/5ML
0.2500 mg | Freq: Once | INTRAVENOUS | Status: AC
  Administered 2020-10-22: 0.25 mg via INTRAVENOUS

## 2020-10-22 MED ORDER — PALONOSETRON HCL INJECTION 0.25 MG/5ML
INTRAVENOUS | Status: AC
  Filled 2020-10-22: qty 5

## 2020-10-22 MED ORDER — OXALIPLATIN CHEMO INJECTION 100 MG/20ML
68.0000 mg/m2 | Freq: Once | INTRAVENOUS | Status: AC
  Administered 2020-10-22: 115 mg via INTRAVENOUS
  Filled 2020-10-22: qty 20

## 2020-10-22 MED ORDER — DEXTROSE 5 % IV SOLN
Freq: Once | INTRAVENOUS | Status: AC
  Filled 2020-10-22: qty 250

## 2020-10-22 MED ORDER — LEUCOVORIN CALCIUM INJECTION 350 MG
400.0000 mg/m2 | Freq: Once | INTRAVENOUS | Status: AC
  Administered 2020-10-22: 684 mg via INTRAVENOUS
  Filled 2020-10-22: qty 34.2

## 2020-10-22 MED ORDER — FLUOROURACIL CHEMO INJECTION 5 GM/100ML
1920.0000 mg/m2 | INTRAVENOUS | Status: DC
  Administered 2020-10-22: 3300 mg via INTRAVENOUS
  Filled 2020-10-22: qty 66

## 2020-10-22 MED ORDER — SODIUM CHLORIDE 0.9 % IV SOLN
10.0000 mg | Freq: Once | INTRAVENOUS | Status: AC
  Administered 2020-10-22: 10 mg via INTRAVENOUS
  Filled 2020-10-22: qty 1

## 2020-10-22 MED ORDER — FLUOROURACIL CHEMO INJECTION 2.5 GM/50ML
320.0000 mg/m2 | Freq: Once | INTRAVENOUS | Status: AC
  Administered 2020-10-22: 550 mg via INTRAVENOUS
  Filled 2020-10-22: qty 11

## 2020-10-22 NOTE — Progress Notes (Signed)
Hematology and Oncology Follow Up Visit  Bailey Walker [id number] 1945/12/26 75 y.o. 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  Thankfully, we had the interpreter come in on the mobile interpreter machine.  She seems to be doing okay.  She is having some pain in the lower abdomen.  This is where she had her surgery.  It does look little bit swollen.  I do think there is any fluid buildup.  Her colostomy seems to be working fairly well.  She seems to be eating okay.  There is no nausea or vomiting.  She is having no mouth sores.  Her last CEA level was holding steady at 3.5.  Hopefully, we will be able to get the colostomy reversed for her.  This, I think, will make her life a lot easier.  Medications:  Allergies as of 10/22/2020      Reactions   Nitroglycerin Nausea And Vomiting   Ampicillin Other (See Comments)   Per allergy test      Medication List       Accurate as of October 22, 2020 10:48 AM. If you have any questions, ask your nurse or doctor.        alendronate 70 MG tablet Commonly known as: FOSAMAX Take 70 mg by mouth every Sunday.   aspirin EC 81 MG tablet Take 81 mg by mouth daily. Swallow whole.   denosumab 60 MG/ML Sosy injection Commonly known as: PROLIA Inject 60 mg into the skin every 6 (six) months.   dexamethasone 4 MG tablet Commonly known as: DECADRON Take 2 tablets (8 mg total) by mouth daily. Start the day after chemotherapy for 2 days. Take with food.   Dexilant 30 MG capsule Generic drug: Dexlansoprazole TAKE 1 CAPSULE BY MOUTH EVERY DAY What changed: how much to take   docusate sodium 100 MG capsule Commonly known as: COLACE Take 1 capsule (100 mg total)  by mouth 2 (two) times daily.   doxycycline 100 MG tablet Commonly known as: VIBRA-TABS Take 1 tablet (100 mg total) by mouth 2 (two) times daily.   feeding supplement Liqd Take 237 mLs by mouth 3 (three) times daily between meals.   gabapentin 100 MG capsule Commonly known as: NEURONTIN Take 100 mg by mouth 3 (three) times daily.   glipiZIDE 5 MG tablet Commonly known as: GLUCOTROL TAKE 1 TABLET BY MOUTH TWICE A DAY BEFORE MEALS What changed: See the new instructions.   levothyroxine 75 MCG tablet Commonly known as: SYNTHROID TAKE 1 TABLET BY MOUTH EVERY DAY What changed: when to take this   lidocaine-prilocaine cream Commonly known as: EMLA Apply to affected area once   metFORMIN 500 MG tablet Commonly known as: GLUCOPHAGE Take 0.5 tablets (250 mg total) by mouth 2 (two) times daily with a meal. What changed:   how much to take  when to take this  additional instructions   methocarbamol 500 MG tablet Commonly known as: ROBAXIN Take 1 tablet (500 mg total) by mouth every 12 (twelve) hours as needed for muscle spasms.   metoprolol tartrate 25 MG tablet Commonly known as: LOPRESSOR Take 1 tablet (25 mg total) by mouth 2 (two) times daily.   multivitamin with minerals Tabs tablet Take  1 tablet by mouth daily.   mupirocin ointment 2 % Commonly known as: BACTROBAN Apply 1 application topically 2 (two) times daily.   nitroGLYCERIN 0.4 MG SL tablet Commonly known as: NITROSTAT Place 1 tablet (0.4 mg total) under the tongue every 5 (five) minutes as needed for chest pain.   nystatin powder Commonly known as: MYCOSTATIN/NYSTOP Apply topically.   ondansetron 4 MG tablet Commonly known as: ZOFRAN Take 1 tablet (4 mg total) by mouth every 6 (six) hours as needed for nausea.   ondansetron 8 MG tablet Commonly known as: Zofran Take 1 tablet (8 mg total) by mouth 2 (two) times daily as needed for refractory nausea / vomiting. Start on day 3 after chemotherapy.    prednisoLONE acetate 1 % ophthalmic suspension Commonly known as: PRED FORTE Place 1 drop into both eyes See admin instructions. Instill one drop into both eyes on Monday, Wednesday, Friday nights   prochlorperazine 10 MG tablet Commonly known as: COMPAZINE Take 1 tablet (10 mg total) by mouth every 6 (six) hours as needed (Nausea or vomiting).   traMADol 50 MG tablet Commonly known as: ULTRAM Take 50 mg by mouth every 6 (six) hours as needed.       Allergies:  Allergies  Allergen Reactions  . Nitroglycerin Nausea And Vomiting  . Ampicillin Other (See Comments)    Per allergy test    Past Medical History, Surgical history, Social history, and Family History were reviewed and updated.  Review of Systems: Review of Systems  HENT: Negative.   Eyes: Negative.   Respiratory: Positive for shortness of breath.   Cardiovascular: Negative.   Gastrointestinal: Positive for abdominal pain.  Genitourinary: Negative.   Musculoskeletal: Positive for back pain.  Skin: Negative.   Neurological: Negative.   Endo/Heme/Allergies: Negative.   Psychiatric/Behavioral: Negative.      Physical Exam:  weight is 165 lb (74.8 kg). Her oral temperature is 98.2 F (36.8 C). Her blood pressure is 131/74 and her pulse is 73. Her respiration is 18 and oxygen saturation is 100%.   Wt Readings from Last 3 Encounters:  10/22/20 165 lb (74.8 kg)  10/01/20 165 lb (74.8 kg)  09/10/20 159 lb (72.1 kg)    Physical Exam Vitals reviewed.  HENT:     Head: Normocephalic and atraumatic.     Mouth/Throat:     Mouth: Oropharynx is clear and moist.  Eyes:     Extraocular Movements: EOM normal.     Pupils: Pupils are equal, round, and reactive to light.  Cardiovascular:     Rate and Rhythm: Normal rate and regular rhythm.     Heart sounds: Normal heart sounds.  Pulmonary:     Effort: Pulmonary effort is normal.     Breath sounds: Normal breath sounds.  Abdominal:     General: Bowel sounds are  normal.     Palpations: Abdomen is soft.     Comments: Her abdomen is slightly distended.  There is no obvious fluid wave.  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  She still has a dressing over the abdominal laparotomy scar.  She does not have any obvious erythema or warmth.  There is no exudate coming from the scars.  She has decent bowel sounds.  There is no palpable liver or spleen tip.  Musculoskeletal:        General: No tenderness, deformity or edema. Normal range of motion.     Cervical back: Normal range of motion.  Lymphadenopathy:  Cervical: No cervical adenopathy.  Skin:    General: Skin is warm and dry.     Findings: No erythema or rash.  Neurological:     Mental Status: She is alert and oriented to person, place, and time.  Psychiatric:        Mood and Affect: Mood and affect normal.        Behavior: Behavior normal.        Thought Content: Thought content normal.        Judgment: Judgment normal.      Lab Results  Component Value Date   WBC 5.9 10/22/2020   HGB 12.6 10/22/2020   HCT 38.3 10/22/2020   MCV 101.9 (H) 10/22/2020   PLT 246 10/22/2020   Lab Results  Component Value Date   FERRITIN 96 07/04/2020   IRON 55 07/04/2020   TIBC 335 07/04/2020   UIBC 280 07/04/2020   IRONPCTSAT 17 (L) 07/04/2020   Lab Results  Component Value Date   RETICCTPCT 1.9 01/05/2020   RBC 3.76 (L) 10/22/2020   No results found for: KPAFRELGTCHN, LAMBDASER, KAPLAMBRATIO No results found for: IGGSERUM, IGA, IGMSERUM No results found for: Odetta Pink, SPEI   Chemistry      Component Value Date/Time   NA 137 10/22/2020 0940   NA 139 07/12/2019 1110   K 4.1 10/22/2020 0940   CL 100 10/22/2020 0940   CO2 26 10/22/2020 0940   BUN 17 10/22/2020 0940   BUN 15 07/12/2019 1110   CREATININE 0.64 10/22/2020 0940   CREATININE 0.74 01/08/2017 1100      Component Value Date/Time   CALCIUM 9.6 10/22/2020 0940    ALKPHOS 39 10/22/2020 0940   AST 20 10/22/2020 0940   ALT 17 10/22/2020 0940   BILITOT 0.3 10/22/2020 0940       Impression and Plan: Ms. 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   We will go ahead with her eighth cycle of treatment.  After this, we will then get a CT scan on her.  The CT scan hopefully will not show any issues.  We will then proceed with hopefully getting the colostomy reversed.  We will have a CT scan done in about 2 or 3 weeks.  I will plan to see her back afterwards.  Volanda Napoleon, MD 1/31/202210:48 AM

## 2020-10-22 NOTE — Patient Instructions (Signed)
Oxaliplatin Injection What is this medicine? OXALIPLATIN (ox AL i PLA tin) is a chemotherapy drug. It targets fast dividing cells, like cancer cells, and causes these cells to die. This medicine is used to treat cancers of the colon and rectum, and many other cancers. This medicine may be used for other purposes; ask your health care provider or pharmacist if you have questions. COMMON BRAND NAME(S): Eloxatin What should I tell my health care provider before I take this medicine? They need to know if you have any of these conditions:  heart disease  history of irregular heartbeat  liver disease  low blood counts, like white cells, platelets, or red blood cells  lung or breathing disease, like asthma  take medicines that treat or prevent blood clots  tingling of the fingers or toes, or other nerve disorder  an unusual or allergic reaction to oxaliplatin, other chemotherapy, other medicines, foods, dyes, or preservatives  pregnant or trying to get pregnant  breast-feeding How should I use this medicine? This drug is given as an infusion into a vein. It is administered in a hospital or clinic by a specially trained health care professional. Talk to your pediatrician regarding the use of this medicine in children. Special care may be needed. Overdosage: If you think you have taken too much of this medicine contact a poison control center or emergency room at once. NOTE: This medicine is only for you. Do not share this medicine with others. What if I miss a dose? It is important not to miss a dose. Call your doctor or health care professional if you are unable to keep an appointment. What may interact with this medicine? Do not take this medicine with any of the following medications:  cisapride  dronedarone  pimozide  thioridazine This medicine may also interact with the following medications:  aspirin and aspirin-like medicines  certain medicines that treat or prevent  blood clots like warfarin, apixaban, dabigatran, and rivaroxaban  cisplatin  cyclosporine  diuretics  medicines for infection like acyclovir, adefovir, amphotericin B, bacitracin, cidofovir, foscarnet, ganciclovir, gentamicin, pentamidine, vancomycin  NSAIDs, medicines for pain and inflammation, like ibuprofen or naproxen  other medicines that prolong the QT interval (an abnormal heart rhythm)  pamidronate  zoledronic acid This list may not describe all possible interactions. Give your health care provider a list of all the medicines, herbs, non-prescription drugs, or dietary supplements you use. Also tell them if you smoke, drink alcohol, or use illegal drugs. Some items may interact with your medicine. What should I watch for while using this medicine? Your condition will be monitored carefully while you are receiving this medicine. You may need blood work done while you are taking this medicine. This medicine may make you feel generally unwell. This is not uncommon as chemotherapy can affect healthy cells as well as cancer cells. Report any side effects. Continue your course of treatment even though you feel ill unless your healthcare professional tells you to stop. This medicine can make you more sensitive to cold. Do not drink cold drinks or use ice. Cover exposed skin before coming in contact with cold temperatures or cold objects. When out in cold weather wear warm clothing and cover your mouth and nose to warm the air that goes into your lungs. Tell your doctor if you get sensitive to the cold. Do not become pregnant while taking this medicine or for 9 months after stopping it. Women should inform their health care professional if they wish to become   pregnant or think they might be pregnant. Men should not father a child while taking this medicine and for 6 months after stopping it. There is potential for serious side effects to an unborn child. Talk to your health care professional  for more information. Do not breast-feed a child while taking this medicine or for 3 months after stopping it. This medicine has caused ovarian failure in some women. This medicine may make it more difficult to get pregnant. Talk to your health care professional if you are concerned about your fertility. This medicine has caused decreased sperm counts in some men. This may make it more difficult to father a child. Talk to your health care professional if you are concerned about your fertility. This medicine may increase your risk of getting an infection. Call your health care professional for advice if you get a fever, chills, or sore throat, or other symptoms of a cold or flu. Do not treat yourself. Try to avoid being around people who are sick. Avoid taking medicines that contain aspirin, acetaminophen, ibuprofen, naproxen, or ketoprofen unless instructed by your health care professional. These medicines may hide a fever. Be careful brushing or flossing your teeth or using a toothpick because you may get an infection or bleed more easily. If you have any dental work done, tell your dentist you are receiving this medicine. What side effects may I notice from receiving this medicine? Side effects that you should report to your doctor or health care professional as soon as possible:  allergic reactions like skin rash, itching or hives, swelling of the face, lips, or tongue  breathing problems  cough  low blood counts - this medicine may decrease the number of white blood cells, red blood cells, and platelets. You may be at increased risk for infections and bleeding  nausea, vomiting  pain, redness, or irritation at site where injected  pain, tingling, numbness in the hands or feet  signs and symptoms of bleeding such as bloody or black, tarry stools; red or dark brown urine; spitting up blood or brown material that looks like coffee grounds; red spots on the skin; unusual bruising or bleeding  from the eyes, gums, or nose  signs and symptoms of a dangerous change in heartbeat or heart rhythm like chest pain; dizziness; fast, irregular heartbeat; palpitations; feeling faint or lightheaded; falls  signs and symptoms of infection like fever; chills; cough; sore throat; pain or trouble passing urine  signs and symptoms of liver injury like dark yellow or brown urine; general ill feeling or flu-like symptoms; light-colored stools; loss of appetite; nausea; right upper belly pain; unusually weak or tired; yellowing of the eyes or skin  signs and symptoms of low red blood cells or anemia such as unusually weak or tired; feeling faint or lightheaded; falls  signs and symptoms of muscle injury like dark urine; trouble passing urine or change in the amount of urine; unusually weak or tired; muscle pain; back pain Side effects that usually do not require medical attention (report to your doctor or health care professional if they continue or are bothersome):  changes in taste  diarrhea  gas  hair loss  loss of appetite  mouth sores This list may not describe all possible side effects. Call your doctor for medical advice about side effects. You may report side effects to FDA at 1-800-FDA-1088. Where should I keep my medicine? This drug is given in a hospital or clinic and will not be stored at home. NOTE:   This sheet is a summary. It may not cover all possible information. If you have questions about this medicine, talk to your doctor, pharmacist, or health care provider.  2021 [employer] Standard (2019-01-26 12:20:35) Leucovorin injection What is this medicine? LEUCOVORIN (loo koe VOR in) is used to prevent or treat the harmful effects of some medicines. This medicine is used to treat anemia caused by a low amount of folic acid in the body. It is also used with 5-fluorouracil (5-FU) to treat colon cancer. This medicine may be used for other purposes; ask your health care provider  or pharmacist if you have questions. What should I tell my health care provider before I take this medicine? They need to know if you have any of these conditions:  anemia from low levels of vitamin B-12 in the blood  an unusual or allergic reaction to leucovorin, folic acid, other medicines, foods, dyes, or preservatives  pregnant or trying to get pregnant  breast-feeding How should I use this medicine? This medicine is for injection into a muscle or into a vein. It is given by a health care professional in a hospital or clinic setting. Talk to your pediatrician regarding the use of this medicine in children. Special care may be needed. Overdosage: If you think you have taken too much of this medicine contact a poison control center or emergency room at once. NOTE: This medicine is only for you. Do not share this medicine with others. What if I miss a dose? This does not apply. What may interact with this medicine?  capecitabine  fluorouracil  phenobarbital  phenytoin  primidone  trimethoprim-sulfamethoxazole This list may not describe all possible interactions. Give your health care provider a list of all the medicines, herbs, non-prescription drugs, or dietary supplements you use. Also tell them if you smoke, drink alcohol, or use illegal drugs. Some items may interact with your medicine. What should I watch for while using this medicine? Your condition will be monitored carefully while you are receiving this medicine. This medicine may increase the side effects of 5-fluorouracil, 5-FU. Tell your doctor or health care professional if you have diarrhea or mouth sores that do not get better or that get worse. What side effects may I notice from receiving this medicine? Side effects that you should report to your doctor or health care professional as soon as possible:  allergic reactions like skin rash, itching or hives, swelling of the face, lips, or tongue  breathing  problems  fever, infection  mouth sores  unusual bleeding or bruising  unusually weak or tired Side effects that usually do not require medical attention (report to your doctor or health care professional if they continue or are bothersome):  constipation or diarrhea  loss of appetite  nausea, vomiting This list may not describe all possible side effects. Call your doctor for medical advice about side effects. You may report side effects to FDA at 1-800-FDA-1088. Where should I keep my medicine? This drug is given in a hospital or clinic and will not be stored at home. NOTE: This sheet is a summary. It may not cover all possible information. If you have questions about this medicine, talk to your doctor, pharmacist, or health care provider.  2021 [employer] Standard (2008-03-14 16:50:29) Fluorouracil, 5-FU injection What is this medicine? FLUOROURACIL, 5-FU (flure oh YOOR a sil) is a chemotherapy drug. It slows the growth of cancer cells. This medicine is used to treat many types of cancer like breast cancer, colon or   rectal cancer, pancreatic cancer, and stomach cancer. This medicine may be used for other purposes; ask your health care provider or pharmacist if you have questions. COMMON BRAND NAME(S): Adrucil What should I tell my health care provider before I take this medicine? They need to know if you have any of these conditions:  blood disorders  dihydropyrimidine dehydrogenase (DPD) deficiency  infection (especially a virus infection such as chickenpox, cold sores, or herpes)  kidney disease  liver disease  malnourished, poor nutrition  recent or ongoing radiation therapy  an unusual or allergic reaction to fluorouracil, other chemotherapy, other medicines, foods, dyes, or preservatives  pregnant or trying to get pregnant  breast-feeding How should I use this medicine? This drug is given as an infusion or injection into a vein. It is administered in a  hospital or clinic by a specially trained health care professional. Talk to your pediatrician regarding the use of this medicine in children. Special care may be needed. Overdosage: If you think you have taken too much of this medicine contact a poison control center or emergency room at once. NOTE: This medicine is only for you. Do not share this medicine with others. What if I miss a dose? It is important not to miss your dose. Call your doctor or health care professional if you are unable to keep an appointment. What may interact with this medicine? Do not take this medicine with any of the following medications:  live virus vaccines This medicine may also interact with the following medications:  medicines that treat or prevent blood clots like warfarin, enoxaparin, and dalteparin This list may not describe all possible interactions. Give your health care provider a list of all the medicines, herbs, non-prescription drugs, or dietary supplements you use. Also tell them if you smoke, drink alcohol, or use illegal drugs. Some items may interact with your medicine. What should I watch for while using this medicine? Visit your doctor for checks on your progress. This drug may make you feel generally unwell. This is not uncommon, as chemotherapy can affect healthy cells as well as cancer cells. Report any side effects. Continue your course of treatment even though you feel ill unless your doctor tells you to stop. In some cases, you may be given additional medicines to help with side effects. Follow all directions for their use. Call your doctor or health care professional for advice if you get a fever, chills or sore throat, or other symptoms of a cold or flu. Do not treat yourself. This drug decreases your body's ability to fight infections. Try to avoid being around people who are sick. This medicine may increase your risk to bruise or bleed. Call your doctor or health care professional if you  notice any unusual bleeding. Be careful brushing and flossing your teeth or using a toothpick because you may get an infection or bleed more easily. If you have any dental work done, tell your dentist you are receiving this medicine. Avoid taking products that contain aspirin, acetaminophen, ibuprofen, naproxen, or ketoprofen unless instructed by your doctor. These medicines may hide a fever. Do not become pregnant while taking this medicine. Women should inform their doctor if they wish to become pregnant or think they might be pregnant. There is a potential for serious side effects to an unborn child. Talk to your health care professional or pharmacist for more information. Do not breast-feed an infant while taking this medicine. Men should inform their doctor if they wish to father a  child. This medicine may lower sperm counts. Do not treat diarrhea with over the counter products. Contact your doctor if you have diarrhea that lasts more than 2 days or if it is severe and watery. This medicine can make you more sensitive to the sun. Keep out of the sun. If you cannot avoid being in the sun, wear protective clothing and use sunscreen. Do not use sun lamps or tanning beds/booths. What side effects may I notice from receiving this medicine? Side effects that you should report to your doctor or health care professional as soon as possible:  allergic reactions like skin rash, itching or hives, swelling of the face, lips, or tongue  low blood counts - this medicine may decrease the number of white blood cells, red blood cells and platelets. You may be at increased risk for infections and bleeding.  signs of infection - fever or chills, cough, sore throat, pain or difficulty passing urine  signs of decreased platelets or bleeding - bruising, pinpoint red spots on the skin, black, tarry stools, blood in the urine  signs of decreased red blood cells - unusually weak or tired, fainting spells,  lightheadedness  breathing problems  changes in vision  chest pain  mouth sores  nausea and vomiting  pain, swelling, redness at site where injected  pain, tingling, numbness in the hands or feet  redness, swelling, or sores on hands or feet  stomach pain  unusual bleeding Side effects that usually do not require medical attention (report to your doctor or health care professional if they continue or are bothersome):  changes in finger or toe nails  diarrhea  dry or itchy skin  hair loss  headache  loss of appetite  sensitivity of eyes to the light  stomach upset  unusually teary eyes This list may not describe all possible side effects. Call your doctor for medical advice about side effects. You may report side effects to FDA at 1-800-FDA-1088. Where should I keep my medicine? This drug is given in a hospital or clinic and will not be stored at home. NOTE: This sheet is a summary. It may not cover all possible information. If you have questions about this medicine, talk to your doctor, pharmacist, or health care provider.  2021 [employer]/Gold Standard (2019-08-09 15:00:03)  

## 2020-10-22 NOTE — Patient Instructions (Signed)

## 2020-10-24 ENCOUNTER — Inpatient Hospital Stay: Payer: Medicaid Other | Attending: Hematology and Oncology

## 2020-10-24 ENCOUNTER — Other Ambulatory Visit: Payer: Self-pay

## 2020-10-24 VITALS — BP 133/82 | HR 79 | Temp 98.2°F | Resp 18

## 2020-10-24 DIAGNOSIS — Z933 Colostomy status: Secondary | ICD-10-CM | POA: Insufficient documentation

## 2020-10-24 DIAGNOSIS — D510 Vitamin B12 deficiency anemia due to intrinsic factor deficiency: Secondary | ICD-10-CM | POA: Insufficient documentation

## 2020-10-24 DIAGNOSIS — Z7982 Long term (current) use of aspirin: Secondary | ICD-10-CM | POA: Insufficient documentation

## 2020-10-24 DIAGNOSIS — Z5111 Encounter for antineoplastic chemotherapy: Secondary | ICD-10-CM | POA: Diagnosis present

## 2020-10-24 DIAGNOSIS — Z79899 Other long term (current) drug therapy: Secondary | ICD-10-CM | POA: Diagnosis not present

## 2020-10-24 DIAGNOSIS — Z452 Encounter for adjustment and management of vascular access device: Secondary | ICD-10-CM | POA: Insufficient documentation

## 2020-10-24 DIAGNOSIS — R918 Other nonspecific abnormal finding of lung field: Secondary | ICD-10-CM | POA: Insufficient documentation

## 2020-10-24 DIAGNOSIS — Z9221 Personal history of antineoplastic chemotherapy: Secondary | ICD-10-CM | POA: Insufficient documentation

## 2020-10-24 DIAGNOSIS — C187 Malignant neoplasm of sigmoid colon: Secondary | ICD-10-CM | POA: Insufficient documentation

## 2020-10-24 DIAGNOSIS — C772 Secondary and unspecified malignant neoplasm of intra-abdominal lymph nodes: Secondary | ICD-10-CM

## 2020-10-24 MED ORDER — HEPARIN SOD (PORK) LOCK FLUSH 100 UNIT/ML IV SOLN
500.0000 [IU] | Freq: Once | INTRAVENOUS | Status: AC | PRN
  Administered 2020-10-24: 1000 [IU]
  Filled 2020-10-24: qty 5

## 2020-10-24 MED ORDER — SODIUM CHLORIDE 0.9% FLUSH
10.0000 mL | INTRAVENOUS | Status: DC | PRN
  Administered 2020-10-24: 10 mL
  Filled 2020-10-24: qty 10

## 2020-10-24 NOTE — Patient Instructions (Signed)
Implanted Port Insertion, Care After This sheet gives you information about how to care for yourself after your procedure. Your health care provider may also give you more specific instructions. If you have problems or questions, contact your health care provider. What can I expect after the procedure? After the procedure, it is common to have:  Discomfort at the port insertion site.  Bruising on the skin over the port. This should improve over 3-4 days. Follow these instructions at home: Port care  After your port is placed, you will get a manufacturer's information card. The card has information about your port. Keep this card with you at all times.  Take care of the port as told by your health care provider. Ask your health care provider if you or a family member can get training for taking care of the port at home. A home health care nurse may also take care of the port.  Make sure to remember what type of port you have. Incision care  Follow instructions from your health care provider about how to take care of your port insertion site. Make sure you: ? Wash your hands with soap and water before and after you change your bandage (dressing). If soap and water are not available, use hand sanitizer. ? Change your dressing as told by your health care provider. ? Leave stitches (sutures), skin glue, or adhesive strips in place. These skin closures may need to stay in place for 2 weeks or longer. If adhesive strip edges start to loosen and curl up, you may trim the loose edges. Do not remove adhesive strips completely unless your health care provider tells you to do that.  Check your port insertion site every day for signs of infection. Check for: ? Redness, swelling, or pain. ? Fluid or blood. ? Warmth. ? Pus or a bad smell.      Activity  Return to your normal activities as told by your health care provider. Ask your health care provider what activities are safe for you.  Do not  lift anything that is heavier than 10 lb (4.5 kg), or the limit that you are told, until your health care provider says that it is safe. General instructions  Take over-the-counter and prescription medicines only as told by your health care provider.  Do not take baths, swim, or use a hot tub until your health care provider approves. Ask your health care provider if you may take showers. You may only be allowed to take sponge baths.  Do not drive for 24 hours if you were given a sedative during your procedure.  Wear a medical alert bracelet in case of an emergency. This will tell any health care providers that you have a port.  Keep all follow-up visits as told by your health care provider. This is important. Contact a health care provider if:  You cannot flush your port with saline as directed, or you cannot draw blood from the port.  You have a fever or chills.  You have redness, swelling, or pain around your port insertion site.  You have fluid or blood coming from your port insertion site.  Your port insertion site feels warm to the touch.  You have pus or a bad smell coming from the port insertion site. Get help right away if:  You have chest pain or shortness of breath.  You have bleeding from your port that you cannot control. Summary  Take care of the port as told by your   health care provider. Keep the manufacturer's information card with you at all times.  Change your dressing as told by your health care provider.  Contact a health care provider if you have a fever or chills or if you have redness, swelling, or pain around your port insertion site.  Keep all follow-up visits as told by your health care provider. This information is not intended to replace advice given to you by your health care provider. Make sure you discuss any questions you have with your health care provider. Document Revised: 04/06/2018 Document Reviewed: 04/06/2018 Elsevier Patient Education   2021 [employer]  

## 2020-10-25 ENCOUNTER — Telehealth: Payer: Self-pay

## 2020-10-25 NOTE — Telephone Encounter (Signed)
Aware of ct appt on 11/13/20, pt also has contrast  Bailey Walker

## 2020-11-13 ENCOUNTER — Encounter (HOSPITAL_BASED_OUTPATIENT_CLINIC_OR_DEPARTMENT_OTHER): Payer: Self-pay

## 2020-11-13 ENCOUNTER — Inpatient Hospital Stay: Payer: Medicaid Other

## 2020-11-13 ENCOUNTER — Other Ambulatory Visit: Payer: Self-pay

## 2020-11-13 ENCOUNTER — Ambulatory Visit (HOSPITAL_BASED_OUTPATIENT_CLINIC_OR_DEPARTMENT_OTHER)
Admission: RE | Admit: 2020-11-13 | Discharge: 2020-11-13 | Disposition: A | Discharge status: Home / Self Care | Payer: Medicaid Other | Source: Ambulatory Visit | Attending: Hematology & Oncology | Admitting: Hematology & Oncology

## 2020-11-13 DIAGNOSIS — C772 Secondary and unspecified malignant neoplasm of intra-abdominal lymph nodes: Secondary | ICD-10-CM | POA: Diagnosis present

## 2020-11-13 DIAGNOSIS — Z452 Encounter for adjustment and management of vascular access device: Secondary | ICD-10-CM | POA: Diagnosis not present

## 2020-11-13 DIAGNOSIS — C187 Malignant neoplasm of sigmoid colon: Secondary | ICD-10-CM

## 2020-11-13 MED ORDER — IOHEXOL 300 MG/ML  SOLN
100.0000 mL | Freq: Once | INTRAMUSCULAR | Status: AC | PRN
  Administered 2020-11-13: 100 mL via INTRAVENOUS

## 2020-11-13 NOTE — Patient Instructions (Signed)

## 2020-11-15 ENCOUNTER — Encounter: Payer: Self-pay | Admitting: Hematology & Oncology

## 2020-11-15 ENCOUNTER — Inpatient Hospital Stay: Payer: Medicaid Other

## 2020-11-15 ENCOUNTER — Other Ambulatory Visit: Payer: Self-pay

## 2020-11-15 ENCOUNTER — Inpatient Hospital Stay (HOSPITAL_BASED_OUTPATIENT_CLINIC_OR_DEPARTMENT_OTHER): Payer: Medicaid Other | Admitting: Hematology & Oncology

## 2020-11-15 VITALS — BP 140/70 | HR 85 | Temp 97.8°F | Resp 17 | Wt 170.0 lb

## 2020-11-15 DIAGNOSIS — C772 Secondary and unspecified malignant neoplasm of intra-abdominal lymph nodes: Secondary | ICD-10-CM

## 2020-11-15 DIAGNOSIS — Z452 Encounter for adjustment and management of vascular access device: Secondary | ICD-10-CM | POA: Diagnosis not present

## 2020-11-15 DIAGNOSIS — C187 Malignant neoplasm of sigmoid colon: Secondary | ICD-10-CM | POA: Diagnosis not present

## 2020-11-15 DIAGNOSIS — D509 Iron deficiency anemia, unspecified: Secondary | ICD-10-CM

## 2020-11-15 LAB — CBC WITH DIFFERENTIAL (CANCER CENTER ONLY)
Abs Immature Granulocytes: 0.08 10*3/uL — ABNORMAL HIGH (ref 0.00–0.07)
Basophils Absolute: 0.1 10*3/uL (ref 0.0–0.1)
Basophils Relative: 1 %
Eosinophils Absolute: 0.2 10*3/uL (ref 0.0–0.5)
Eosinophils Relative: 3 %
HCT: 37.6 % (ref 36.0–46.0)
Hemoglobin: 12.3 g/dL (ref 12.0–15.0)
Immature Granulocytes: 1 %
Lymphocytes Relative: 36 %
Lymphs Abs: 2.2 10*3/uL (ref 0.7–4.0)
MCH: 33.3 pg (ref 26.0–34.0)
MCHC: 32.7 g/dL (ref 30.0–36.0)
MCV: 101.9 fL — ABNORMAL HIGH (ref 80.0–100.0)
Monocytes Absolute: 1 10*3/uL (ref 0.1–1.0)
Monocytes Relative: 17 %
Neutro Abs: 2.7 10*3/uL (ref 1.7–7.7)
Neutrophils Relative %: 42 %
Platelet Count: 221 10*3/uL (ref 150–400)
RBC: 3.69 MIL/uL — ABNORMAL LOW (ref 3.87–5.11)
RDW: 13.7 % (ref 11.5–15.5)
WBC Count: 6.3 10*3/uL (ref 4.0–10.5)
nRBC: 0 % (ref 0.0–0.2)

## 2020-11-15 LAB — CMP (CANCER CENTER ONLY)
ALT: 17 U/L (ref 0–44)
AST: 22 U/L (ref 15–41)
Albumin: 4.1 g/dL (ref 3.5–5.0)
Alkaline Phosphatase: 41 U/L (ref 38–126)
Anion gap: 8 (ref 5–15)
BUN: 18 mg/dL (ref 8–23)
CO2: 28 mmol/L (ref 22–32)
Calcium: 9.7 mg/dL (ref 8.9–10.3)
Chloride: 100 mmol/L (ref 98–111)
Creatinine: 0.63 mg/dL (ref 0.44–1.00)
GFR, Estimated: >60 mL/min (ref >60)
Glucose, Bld: 149 mg/dL — ABNORMAL HIGH (ref 70–99)
Potassium: 3.7 mmol/L (ref 3.5–5.1)
Sodium: 136 mmol/L (ref 135–145)
Total Bilirubin: 0.3 mg/dL (ref 0.3–1.2)
Total Protein: 6.9 g/dL (ref 6.5–8.1)

## 2020-11-15 MED ORDER — SODIUM CHLORIDE 0.9% FLUSH
10.0000 mL | Freq: Once | INTRAVENOUS | Status: AC | PRN
  Administered 2020-11-15: 10 mL
  Filled 2020-11-15: qty 10

## 2020-11-15 MED ORDER — CYANOCOBALAMIN 1000 MCG/ML IJ SOLN: 1000.0000 ug | Freq: Once | INTRAMUSCULAR | Status: DC

## 2020-11-15 MED ORDER — HEPARIN SOD (PORK) LOCK FLUSH 100 UNIT/ML IV SOLN
500.0000 [IU] | Freq: Once | INTRAVENOUS | Status: AC | PRN
  Administered 2020-11-15: 500 [IU]
  Filled 2020-11-15: qty 5

## 2020-11-15 NOTE — Patient Instructions (Signed)

## 2020-11-15 NOTE — Progress Notes (Signed)
Nephew with her to translate medication. Instructed to bring list to next visit.  Verbalized understanding.

## 2020-11-15 NOTE — Progress Notes (Signed)
Hematology and Oncology Follow Up Visit  Jeanice Dempsey [id number] 1945/10/20 75 y.o. 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 Filler is here today with a nephew in law.  She is doing well.  She had a CT scan done last week.  Thankfully, the CT scan did not show any evidence of active disease or disease progression.  She has lung nodules which have been stable.  She really like to have the colostomy reversed.  I will see her problems with her having this done.  Her last CEA level was 3.  She has had no nausea or vomiting.  There is been no cough or shortness of breath.  She has had no problems with leg swelling.  She has had no rashes.  Currently, her performance status is ECOG 1.   Medications:  Allergies as of 11/15/2020      Reactions   Nitroglycerin Nausea And Vomiting   Ampicillin Other (See Comments)   Per allergy test      Medication List       Accurate as of November 15, 2020  2:08 PM. If you have any questions, ask your nurse or doctor.        STOP taking these medications   doxycycline 100 MG tablet Commonly known as: VIBRA-TABS Stopped by: Volanda Napoleon, MD     TAKE these medications   alendronate 70 MG tablet Commonly known as: FOSAMAX Take 70 mg by mouth every Sunday.   aspirin EC 81 MG tablet Take 81 mg by mouth daily. Swallow whole.   denosumab 60 MG/ML Sosy injection Commonly known as: PROLIA Inject 60 mg into the skin every 6 (six) months.   dexamethasone 4 MG tablet Commonly known as: DECADRON Take 2 tablets (8 mg total) by mouth daily. Start the day after chemotherapy for 2 days. Take with food.   Dexilant 30 MG capsule Generic drug: Dexlansoprazole TAKE 1 CAPSULE BY MOUTH EVERY DAY What  changed: how much to take   docusate sodium 100 MG capsule Commonly known as: COLACE Take 1 capsule (100 mg total) by mouth 2 (two) times daily.   feeding supplement Liqd Take 237 mLs by mouth 3 (three) times daily between meals.   gabapentin 100 MG capsule Commonly known as: NEURONTIN Take 100 mg by mouth 3 (three) times daily.   glipiZIDE 5 MG tablet Commonly known as: GLUCOTROL TAKE 1 TABLET BY MOUTH TWICE A DAY BEFORE MEALS What changed: See the new instructions.   levothyroxine 75 MCG tablet Commonly known as: SYNTHROID TAKE 1 TABLET BY MOUTH EVERY DAY What changed: when to take this   lidocaine-prilocaine cream Commonly known as: EMLA Apply to affected area once   metFORMIN 500 MG tablet Commonly known as: GLUCOPHAGE Take 0.5 tablets (250 mg total) by mouth 2 (two) times daily with a meal. What changed:   how much to take  when to take this  additional instructions   methocarbamol 500 MG tablet Commonly known as: ROBAXIN Take 1 tablet (500 mg total) by mouth every 12 (twelve) hours as needed for muscle spasms.   metoprolol tartrate 25 MG tablet Commonly known as: LOPRESSOR Take 1 tablet (25 mg total) by mouth 2 (two) times daily.   multivitamin with minerals Tabs tablet Take  1 tablet by mouth daily.   mupirocin ointment 2 % Commonly known as: BACTROBAN Apply 1 application topically 2 (two) times daily.   nitroGLYCERIN 0.4 MG SL tablet Commonly known as: NITROSTAT Place 1 tablet (0.4 mg total) under the tongue every 5 (five) minutes as needed for chest pain.   nystatin powder Commonly known as: MYCOSTATIN/NYSTOP Apply topically.   ondansetron 4 MG tablet Commonly known as: ZOFRAN Take 1 tablet (4 mg total) by mouth every 6 (six) hours as needed for nausea.   ondansetron 8 MG tablet Commonly known as: Zofran Take 1 tablet (8 mg total) by mouth 2 (two) times daily as needed for refractory nausea / vomiting. Start on day 3 after chemotherapy.    prednisoLONE acetate 1 % ophthalmic suspension Commonly known as: PRED FORTE Place 1 drop into both eyes See admin instructions. Instill one drop into both eyes on Monday, Wednesday, Friday nights   prochlorperazine 10 MG tablet Commonly known as: COMPAZINE Take 1 tablet (10 mg total) by mouth every 6 (six) hours as needed (Nausea or vomiting).   traMADol 50 MG tablet Commonly known as: ULTRAM Take 50 mg by mouth every 6 (six) hours as needed.       Allergies:  Allergies  Allergen Reactions   Nitroglycerin Nausea And Vomiting   Ampicillin Other (See Comments)    Per allergy test    Past Medical History, Surgical history, Social history, and Family History were reviewed and updated.  Review of Systems: Review of Systems  HENT: Negative.   Eyes: Negative.   Respiratory: Positive for shortness of breath.   Cardiovascular: Negative.   Gastrointestinal: Positive for abdominal pain.  Genitourinary: Negative.   Musculoskeletal: Positive for back pain.  Skin: Negative.   Neurological: Negative.   Endo/Heme/Allergies: Negative.   Psychiatric/Behavioral: Negative.      Physical Exam:  weight is 170 lb (77.1 kg). Her oral temperature is 97.8 F (36.6 C). Her blood pressure is 140/70 and her pulse is 85. Her respiration is 17 and oxygen saturation is 98%.   Wt Readings from Last 3 Encounters:  11/15/20 170 lb (77.1 kg)  10/22/20 165 lb (74.8 kg)  10/01/20 165 lb (74.8 kg)    Physical Exam Vitals reviewed.  HENT:     Head: Normocephalic and atraumatic.  Eyes:     Pupils: Pupils are equal, round, and reactive to light.  Cardiovascular:     Rate and Rhythm: Normal rate and regular rhythm.     Heart sounds: Normal heart sounds.  Pulmonary:     Effort: Pulmonary effort is normal.     Breath sounds: Normal breath sounds.  Abdominal:     General: Bowel sounds are normal.     Palpations: Abdomen is soft.     Comments: Her abdomen is slightly distended.  There is no  obvious fluid wave.  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  She still has a dressing over the abdominal laparotomy scar.  She does not have any obvious erythema or warmth.  There is no exudate coming from the scars.  She has decent bowel sounds.  There is no palpable liver or spleen tip.  Musculoskeletal:        General: No tenderness or deformity. Normal range of motion.     Cervical back: Normal range of motion.  Lymphadenopathy:     Cervical: No cervical adenopathy.  Skin:    General: Skin is warm and dry.     Findings: No erythema or  rash.  Neurological:     Mental Status: She is alert and oriented to person, place, and time.  Psychiatric:        Behavior: Behavior normal.        Thought Content: Thought content normal.        Judgment: Judgment normal.      Lab Results  Component Value Date   WBC 6.3 11/15/2020   HGB 12.3 11/15/2020   HCT 37.6 11/15/2020   MCV 101.9 (H) 11/15/2020   PLT 221 11/15/2020   Lab Results  Component Value Date   FERRITIN 96 07/04/2020   IRON 55 07/04/2020   TIBC 335 07/04/2020   UIBC 280 07/04/2020   IRONPCTSAT 17 (L) 07/04/2020   Lab Results  Component Value Date   RETICCTPCT 1.9 01/05/2020   RBC 3.69 (L) 11/15/2020   No results found for: KPAFRELGTCHN, LAMBDASER, KAPLAMBRATIO No results found for: IGGSERUM, IGA, IGMSERUM No results found for: Odetta Pink, SPEI   Chemistry      Component Value Date/Time   NA 136 11/15/2020 1200   NA 139 07/12/2019 1110   K 3.7 11/15/2020 1200   CL 100 11/15/2020 1200   CO2 28 11/15/2020 1200   BUN 18 11/15/2020 1200   BUN 15 07/12/2019 1110   CREATININE 0.63 11/15/2020 1200   CREATININE 0.74 01/08/2017 1100      Component Value Date/Time   CALCIUM 9.7 11/15/2020 1200   ALKPHOS 41 11/15/2020 1200   AST 22 11/15/2020 1200   ALT 17 11/15/2020 1200   BILITOT 0.3 11/15/2020 1200       Impression and Plan: Ms. 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   We will see if Dr. Ninfa Linden can reverse the colostomy.  Again, I do not see any problems with her having this done.  Her lab work looks fine.  Her CT scan looked okay.  I do not believe these lung nodules are a problem or any evidence of malignancy.  Again her life would be a lot better without the colostomy.  We will plan to see her back in 2 months.  By then, I will have to believe that she would have had the colostomy reversed.    She still has the Port-A-Cath in.  We will continue to flush this.     Volanda Napoleon, MD 2/24/20222:08 PM

## 2020-11-16 LAB — CEA (IN HOUSE-CHCC): CEA (CHCC-In House): 3.06 ng/mL (ref 0.00–5.00)

## 2020-11-20 ENCOUNTER — Other Ambulatory Visit: Payer: Self-pay | Admitting: Family

## 2020-11-20 DIAGNOSIS — C187 Malignant neoplasm of sigmoid colon: Secondary | ICD-10-CM

## 2020-11-21 ENCOUNTER — Ambulatory Visit: Payer: Medicaid Other | Admitting: Podiatry

## 2020-12-11 ENCOUNTER — Telehealth: Payer: Self-pay

## 2020-12-11 NOTE — Telephone Encounter (Signed)
   [facility] Pre-operative Risk Assessment    HEARTCARE STAFF: - Please ensure there is not already an duplicate clearance open for this procedure. - Under Visit Info/Reason for Call, type in Other and utilize the format Clearance MM/DD/YY or Clearance TBD. Do not use dashes or single digits. - If request is for dental extraction, please clarify the # of teeth to be extracted.  Request for surgical clearance:  1. What type of surgery is being performed? Ostomy Reversal   2. When is this surgery scheduled? Near Future   3. What type of clearance is required (medical clearance vs. Pharmacy clearance to hold med vs. Both)? Medical  4. Are there any medications that need to be held prior to surgery and how long? None   5. Practice name and name of physician performing surgery? [facility] / Adin Hector MD   6. What is the office phone number? [phone]   7.   What is the office fax number? [phone]  8.   Anesthesia type (None, local, MAC, general) ? General   Toni Arthurs 12/11/2020, 4:44 PM  _________________________________________________________________   (provider comments below)

## 2020-12-12 NOTE — Telephone Encounter (Signed)
Forwarded to requesting party via EPIC fax function 

## 2020-12-12 NOTE — Telephone Encounter (Signed)
Appt scheduled 01-03-21 for cardiac clearance

## 2020-12-12 NOTE — Telephone Encounter (Signed)
Primary Cardiologist:Jonathan Gwenlyn Found, MD  Chart reviewed as part of pre-operative protocol coverage. Because of Bailey Walker's past medical history and time since last visit, he/she will require a follow-up visit in order to better assess preoperative cardiovascular risk.  Pre-op covering staff: - Please schedule appointment and call patient to inform them. - Please contact requesting surgeon's office via preferred method (i.e, phone, fax) to inform them of need for appointment prior to surgery.  If applicable, this message will also be routed to pharmacy pool and/or primary cardiologist for input on holding anticoagulant/antiplatelet agent as requested below so that this information is available at time of patient's appointment.   Deberah Pelton, NP  12/12/2020, 6:32 AM

## 2020-12-18 ENCOUNTER — Other Ambulatory Visit: Payer: Self-pay

## 2020-12-18 ENCOUNTER — Ambulatory Visit (INDEPENDENT_AMBULATORY_CARE_PROVIDER_SITE_OTHER): Payer: Medicaid Other | Admitting: Podiatry

## 2020-12-18 ENCOUNTER — Encounter: Payer: Self-pay | Admitting: Podiatry

## 2020-12-18 DIAGNOSIS — B351 Tinea unguium: Secondary | ICD-10-CM | POA: Diagnosis not present

## 2020-12-18 DIAGNOSIS — M79675 Pain in left toe(s): Secondary | ICD-10-CM | POA: Diagnosis not present

## 2020-12-18 DIAGNOSIS — M79674 Pain in right toe(s): Secondary | ICD-10-CM | POA: Diagnosis not present

## 2020-12-18 DIAGNOSIS — G5793 Unspecified mononeuropathy of bilateral lower limbs: Secondary | ICD-10-CM | POA: Diagnosis not present

## 2020-12-18 DIAGNOSIS — E1142 Type 2 diabetes mellitus with diabetic polyneuropathy: Secondary | ICD-10-CM

## 2020-12-18 NOTE — Patient Instructions (Addendum)
Call [facility] for Neuropathy Cream Prescription at:  Patient Phone Line: [phone]

## 2020-12-20 NOTE — Progress Notes (Signed)
  Subjective:  Patient ID: Bailey Walker, female    DOB: 04/25/1946,  MRN: [id number]  75 y.o. female presents with preventative diabetic foot care and painful thick toenails that are difficult to trim. Pain interferes with ambulation. Aggravating factors include wearing enclosed shoe gear. Pain is relieved with periodic professional debridement..    Her niece, Bailey Walker, is present during today's visit. (phone number [phone]). [name] is requesting another prescription for compounded neuropathy cream from [facility]. She also states Ms. Bailey Walker just completed chemotherapy for colon cancer. She currently has a colostomy.  Review of Systems: Negative except as noted in the HPI. Denies N/V/F/Ch.  Allergies  Allergen Reactions  . Nitroglycerin Nausea And Vomiting  . Ampicillin Other (See Comments)    Per allergy test   Objective:  There were no vitals filed for this visit. Constitutional Patient is a pleasant 75 y.o.  female, in NAD.Marland Kitchen  Vascular Neurovascular status unchanged b/l lower extremities. Capillary fill time to digits <3 seconds b/l lower extremities. Palpable pedal pulses b/l LE. Pedal hair sparse. Lower extremity skin temperature gradient within normal limits. No edema noted b/l lower extremities. Capillary refill normal to all digits.  No cyanosis or clubbing noted.  Neurologic Normal speech. Oriented to person, place, and time. Pt has subjective symptoms of neuropathy. Protective sensation intact 5/5 intact bilaterally with 10g monofilament b/l. Vibratory sensation intact b/l. Clonus negative b/l.  Dermatologic Pedal skin with normal turgor, texture and tone bilaterally. No open wounds bilaterally. No interdigital macerations bilaterally. Toenails 1-5 b/l elongated, discolored, dystrophic, thickened, crumbly with subungual debris and tenderness to dorsal palpation.  Orthopedic: Normal muscle strength 5/5 to all lower extremity muscle groups bilaterally. No pain  crepitus or joint limitation noted with ROM b/l. No gross bony deformities bilaterally. Patient ambulates independent of any assistive aids.   Radiographs: None Assessment:   1. Pain due to onychomycosis of toenails of both feet   2. Neuropathic pain of both feet   3. Diabetic peripheral neuropathy associated with type 2 diabetes mellitus ([place])    Plan:  Patient was evaluated and treated and all questions answered.  Onychomycosis with pain -Nails palliatively debridement as below. -Educated on self-care  Procedure: Nail Debridement Rationale: Pain Type of Debridement: manual, sharp debridement. Instrumentation: Nail nipper, rotary burr. Number of Nails: 10  -Examined patient. -No new findings. No new orders. -Toenails 1-5 b/l were debrided in length and girth with sterile nail nippers and dremel without iatrogenic bleeding.  -Patient to report any pedal injuries to medical professional immediately. -A prescription was sent to [facility] for peripheral neuropathy cream which consists of: Bupivacaine 1%, doxepin 3%, gabapentin 6%, pentoxifylline 3%, and Topimarate 1%. Size 200 gm. Apply 1-2 grams to feet 3-4 times daily as needed for foot pain. -Patient/POA to call should there be question/concern in the interim.  Return in about 3 months (around 03/20/2021).  Marzetta Board, DPM

## 2020-12-26 ENCOUNTER — Other Ambulatory Visit: Payer: Self-pay

## 2020-12-26 ENCOUNTER — Emergency Department (HOSPITAL_BASED_OUTPATIENT_CLINIC_OR_DEPARTMENT_OTHER): Payer: Medicaid Other

## 2020-12-26 ENCOUNTER — Encounter (HOSPITAL_BASED_OUTPATIENT_CLINIC_OR_DEPARTMENT_OTHER): Payer: Self-pay

## 2020-12-26 ENCOUNTER — Observation Stay (HOSPITAL_BASED_OUTPATIENT_CLINIC_OR_DEPARTMENT_OTHER)
Admission: EM | Admit: 2020-12-26 | Discharge: 2020-12-28 | Disposition: A | Discharge status: Home / Self Care | Payer: Medicaid Other | Attending: Internal Medicine | Admitting: Internal Medicine

## 2020-12-26 DIAGNOSIS — Z87891 Personal history of nicotine dependence: Secondary | ICD-10-CM | POA: Insufficient documentation

## 2020-12-26 DIAGNOSIS — Z20822 Contact with and (suspected) exposure to covid-19: Secondary | ICD-10-CM | POA: Diagnosis not present

## 2020-12-26 DIAGNOSIS — Z85038 Personal history of other malignant neoplasm of large intestine: Secondary | ICD-10-CM | POA: Insufficient documentation

## 2020-12-26 DIAGNOSIS — Z79899 Other long term (current) drug therapy: Secondary | ICD-10-CM | POA: Diagnosis not present

## 2020-12-26 DIAGNOSIS — I10 Essential (primary) hypertension: Secondary | ICD-10-CM | POA: Diagnosis not present

## 2020-12-26 DIAGNOSIS — R079 Chest pain, unspecified: Secondary | ICD-10-CM | POA: Diagnosis not present

## 2020-12-26 DIAGNOSIS — R0602 Shortness of breath: Secondary | ICD-10-CM | POA: Insufficient documentation

## 2020-12-26 DIAGNOSIS — E039 Hypothyroidism, unspecified: Secondary | ICD-10-CM | POA: Diagnosis present

## 2020-12-26 DIAGNOSIS — Z7982 Long term (current) use of aspirin: Secondary | ICD-10-CM | POA: Insufficient documentation

## 2020-12-26 DIAGNOSIS — Z7984 Long term (current) use of oral hypoglycemic drugs: Secondary | ICD-10-CM | POA: Insufficient documentation

## 2020-12-26 DIAGNOSIS — E118 Type 2 diabetes mellitus with unspecified complications: Secondary | ICD-10-CM | POA: Insufficient documentation

## 2020-12-26 DIAGNOSIS — R0789 Other chest pain: Principal | ICD-10-CM | POA: Insufficient documentation

## 2020-12-26 DIAGNOSIS — C772 Secondary and unspecified malignant neoplasm of intra-abdominal lymph nodes: Secondary | ICD-10-CM | POA: Diagnosis present

## 2020-12-26 DIAGNOSIS — C187 Malignant neoplasm of sigmoid colon: Secondary | ICD-10-CM | POA: Diagnosis present

## 2020-12-26 DIAGNOSIS — E088 Diabetes mellitus due to underlying condition with unspecified complications: Secondary | ICD-10-CM | POA: Diagnosis present

## 2020-12-26 LAB — RESP PANEL BY RT-PCR (FLU A&B, COVID) ARPGX2
Influenza A by PCR: NEGATIVE
Influenza B by PCR: NEGATIVE
SARS Coronavirus 2 by RT PCR: NEGATIVE

## 2020-12-26 LAB — CBC WITH DIFFERENTIAL/PLATELET
Abs Immature Granulocytes: 0.05 10*3/uL (ref 0.00–0.07)
Basophils Absolute: 0 10*3/uL (ref 0.0–0.1)
Basophils Relative: 0 %
Eosinophils Absolute: 0.2 10*3/uL (ref 0.0–0.5)
Eosinophils Relative: 2 %
HCT: 39.4 % (ref 36.0–46.0)
Hemoglobin: 12.8 g/dL (ref 12.0–15.0)
Immature Granulocytes: 1 %
Lymphocytes Relative: 39 %
Lymphs Abs: 3.6 10*3/uL (ref 0.7–4.0)
MCH: 32.1 pg (ref 26.0–34.0)
MCHC: 32.5 g/dL (ref 30.0–36.0)
MCV: 98.7 fL (ref 80.0–100.0)
Monocytes Absolute: 0.9 10*3/uL (ref 0.1–1.0)
Monocytes Relative: 10 %
Neutro Abs: 4.3 10*3/uL (ref 1.7–7.7)
Neutrophils Relative %: 48 %
Platelets: 210 10*3/uL (ref 150–400)
RBC: 3.99 MIL/uL (ref 3.87–5.11)
RDW: 12.5 % (ref 11.5–15.5)
WBC: 9 10*3/uL (ref 4.0–10.5)
nRBC: 0 % (ref 0.0–0.2)

## 2020-12-26 LAB — TROPONIN I (HIGH SENSITIVITY)
Troponin I (High Sensitivity): 4 ng/L (ref <18)
Troponin I (High Sensitivity): 5 ng/L (ref <18)

## 2020-12-26 LAB — COMPREHENSIVE METABOLIC PANEL
ALT: 23 U/L (ref 0–44)
AST: 31 U/L (ref 15–41)
Albumin: 4 g/dL (ref 3.5–5.0)
Alkaline Phosphatase: 40 U/L (ref 38–126)
Anion gap: 11 (ref 5–15)
BUN: 10 mg/dL (ref 8–23)
CO2: 23 mmol/L (ref 22–32)
Calcium: 8.6 mg/dL — ABNORMAL LOW (ref 8.9–10.3)
Chloride: 104 mmol/L (ref 98–111)
Creatinine, Ser: 0.55 mg/dL (ref 0.44–1.00)
GFR, Estimated: >60 mL/min (ref >60)
Glucose, Bld: 83 mg/dL (ref 70–99)
Potassium: 3.7 mmol/L (ref 3.5–5.1)
Sodium: 138 mmol/L (ref 135–145)
Total Bilirubin: 0.3 mg/dL (ref 0.3–1.2)
Total Protein: 7.6 g/dL (ref 6.5–8.1)

## 2020-12-26 LAB — BRAIN NATRIURETIC PEPTIDE: B Natriuretic Peptide: 116.7 pg/mL — ABNORMAL HIGH (ref 0.0–100.0)

## 2020-12-26 LAB — D-DIMER, QUANTITATIVE: D-Dimer, Quant: <0.27 ug/mL-FEU (ref 0.00–0.50)

## 2020-12-26 MED ORDER — ASPIRIN 81 MG PO CHEW
324.0000 mg | CHEWABLE_TABLET | Freq: Once | ORAL | Status: AC
  Administered 2020-12-26: 324 mg via ORAL
  Filled 2020-12-26: qty 4

## 2020-12-26 NOTE — ED Triage Notes (Signed)
Per niece/interpreter pt c/o CP, SOB x 2 hours-chest congestion-denies cough/fever-NAD-steady gait to tx area

## 2020-12-26 NOTE — H&P (Addendum)
History and Physical    Raguel Kosloski JME:[id number] DOB: [birth year] DOA: 12/26/2020  PCP: [name], MD Patient coming from: [facility]  Chief Complaint: Chest pain  HPI: Bailey Walker is a 75 y.o. 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  High-sensitivity troponin negative x2.  EKG without acute ischemic changes.  D-dimer negative.  No significant elevation of BNP.  Covid and influenza PCR negative.  Chest x-ray showing no active disease. Patient was given aspirin 324 mg.   Patient speaks Aramaic.  Language interpretation done by her niece at bedside (per patient preference).  Niece states she was at work yesterday and in the afternoon around 3:30 PM patient called her to let her know that she was having chest pressure, dizziness, and shortness of breath.  Niece then went home and took her to the emergency room to be evaluated.  States she has been complaining of chest pain on and off since 2013 when she quit smoking.  She was seen by her cardiologist a few years ago and told that her heart was normal.  She usually experiences chest pain with exertion but yesterday it was at rest.  Review of Systems:  All systems reviewed and apart from history of presenting illness, are negative.  Past Medical History:  Diagnosis Date  . 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 ([place]) 04/26/2020  . Diabetes mellitus without complication ([place])   . Goals of care, counseling/discussion 04/26/2020  . Hypertension   . SVT (supraventricular tachycardia) ([place])   . Thyroid disease     Past Surgical History:  Procedure Laterality Date  . APPENDECTOMY    . BIOPSY  04/18/2020   Procedure: BIOPSY;  Surgeon: Lavena Bullion, DO;  Location: [place]  ENDOSCOPY;  Service: Gastroenterology;;  . BIOPSY  04/19/2020   Procedure: BIOPSY;  Surgeon: Lavena Bullion, DO;  Location: [place];  Service: Gastroenterology;;  . BREAST SURGERY     Fatty tissue on biopsy  . ESOPHAGOGASTRODUODENOSCOPY (EGD) WITH PROPOFOL N/A 04/18/2020   Procedure: ESOPHAGOGASTRODUODENOSCOPY (EGD) WITH PROPOFOL;  Surgeon: Lavena Bullion, DO;  Location: [place];  Service: Gastroenterology;  Laterality: N/A;  . EYE SURGERY Bilateral    april and march 2019 for cataracts.   . fatty gland     left wrist , right breast  . FLEXIBLE SIGMOIDOSCOPY N/A 04/18/2020   Procedure: FLEXIBLE SIGMOIDOSCOPY;  Surgeon: Lavena Bullion, DO;  Location: [place];  Service: Gastroenterology;  Laterality: N/A;  . FLEXIBLE SIGMOIDOSCOPY N/A 04/19/2020   Procedure: FLEXIBLE SIGMOIDOSCOPY;  Surgeon: Lavena Bullion, DO;  Location: [place];  Service: Gastroenterology;  Laterality: N/A;  . INCISION AND DRAINAGE OF WOUND N/A 05/01/2020   Procedure: ABDOMINAL  WOUND EXPLORATION; IRRIGATION AND DEBRIDEMENT WOUND;  Surgeon: Rolm Bookbinder, MD;  Location: [place];  Service: General;  Laterality: N/A;  . IR IMAGING GUIDED PORT INSERTION  06/13/2020  . KNEE SURGERY     left knee  . LAPAROTOMY N/A 04/20/2020   Procedure: SIGMOID COLECTOMY AND COLOSTOMY;  Surgeon: Coralie Keens, MD;  Location: [place];  Service: General;  Laterality: N/A;  . SUBMUCOSAL TATTOO INJECTION  04/19/2020   Procedure: SUBMUCOSAL TATTOO INJECTION;  Surgeon: Lavena Bullion, DO;  Location: [place];  Service: Gastroenterology;;     reports that she quit smoking  about 7 years ago. She has never used smokeless tobacco. She reports that she does not drink alcohol and does not use drugs.  Allergies  Allergen Reactions  . Nitroglycerin Nausea And Vomiting  . Ampicillin Other (See Comments)    Per allergy test    Family History  Problem Relation Age of Onset  . Breast cancer Cousin        mat  and pat sides  . Cancer Sister        cancer everywhere  . Colon cancer Neg Hx   . Esophageal cancer Neg Hx   . Rectal cancer Neg Hx     Prior to Admission medications   Medication Sig Start Date End Date Taking? Authorizing Provider  alendronate (FOSAMAX) 70 MG tablet Take 70 mg by mouth every Sunday.  Patient not taking: Reported on 11/15/2020 03/01/20   [provider]  aspirin EC 81 MG tablet Take 81 mg by mouth daily. Swallow whole.    [provider]  denosumab (PROLIA) 60 MG/ML SOSY injection Inject 60 mg into the skin every 6 (six) months.  Patient not taking: Reported on 11/15/2020    [provider]  dexamethasone (DECADRON) 4 MG tablet Take 2 tablets (8 mg total) by mouth daily. Start the day after chemotherapy for 2 days. Take with food. Patient not taking: Reported on 11/15/2020 10/16/20   Volanda Napoleon, MD  DEXILANT 30 MG capsule TAKE 1 CAPSULE BY MOUTH EVERY DAY Patient taking differently: Take 30 mg by mouth daily. 10/20/19   Tresa Garter, MD  docusate sodium (COLACE) 100 MG capsule Take 1 capsule (100 mg total) by mouth 2 (two) times daily. 05/02/20   Raiford Noble Latif, DO  feeding supplement, ENSURE ENLIVE, (ENSURE ENLIVE) LIQD Take 237 mLs by mouth 3 (three) times daily between meals. 05/02/20   Raiford Noble Latif, DO  gabapentin (NEURONTIN) 100 MG capsule Take 100 mg by mouth 3 (three) times daily.  Patient not taking: Reported on 11/15/2020 07/12/20   [provider]  glipiZIDE (GLUCOTROL) 5 MG tablet TAKE 1 TABLET BY MOUTH TWICE A DAY BEFORE MEALS Patient taking differently: Take 5 mg by mouth See admin instructions. Take one tablet (5 mg) by mouth twice daily - after lunch and at midnight 11/04/19   Dorena Dew, FNP  levothyroxine (SYNTHROID) 75 MCG tablet TAKE 1 TABLET BY MOUTH EVERY DAY Patient taking differently: Take 75 mcg by mouth daily before breakfast. 01/02/20   Dorena Dew, FNP  lidocaine-prilocaine (EMLA)  cream Apply to affected area once 06/11/20   Volanda Napoleon, MD  metFORMIN (GLUCOPHAGE) 500 MG tablet Take 0.5 tablets (250 mg total) by mouth 2 (two) times daily with a meal. Patient taking differently: Take 500 mg by mouth See admin instructions. Take one tablet (500 mg) by mouth twice daily - after lunch and at midnight 05/16/19   [name], FNP  methocarbamol (ROBAXIN) 500 MG tablet TAKE 1 TABLET BY MOUTH EVERY 12 HOURS AS NEEDED FOR MUSCLE SPASMS. 11/20/20   Volanda Napoleon, MD  metoprolol tartrate (LOPRESSOR) 25 MG tablet Take 1 tablet (25 mg total) by mouth 2 (two) times daily. 08/02/19   Tresa Garter, MD  Multiple Vitamin (MULTIVITAMIN WITH MINERALS) TABS tablet Take 1 tablet by mouth daily. 05/02/20   Raiford Noble Latif, DO  mupirocin ointment (BACTROBAN) 2 % Apply 1 application topically 2 (two) times daily. Patient not taking: Reported on 11/15/2020 08/15/20   Volanda Napoleon, MD  nitroGLYCERIN (NITROSTAT) 0.4 MG SL tablet Place 1 tablet (0.4 mg total) under the tongue every 5 (five) minutes as needed for chest pain. 05/16/19 04/16/20  [name], [facility]    [provider]  nystatin (MYCOSTATIN/NYSTOP) powder Apply topically. 07/11/20   [provider]  ondansetron (ZOFRAN) 4 MG tablet Take 1 tablet (4 mg total) by mouth every 6 (six) hours as needed for nausea. Patient not taking: Reported on 11/15/2020 05/02/20   Raiford Noble Latif, DO  ondansetron (ZOFRAN) 8 MG tablet Take 1 tablet (8 mg total) by mouth 2 (two) times daily as needed for refractory nausea / vomiting. Start on day 3 after chemotherapy. Patient not taking: Reported on 11/15/2020 06/11/20   Volanda Napoleon, MD  prednisoLONE acetate (PRED FORTE) 1 % ophthalmic suspension Place 1 drop into both eyes See admin instructions. Instill one drop into both eyes on Monday, Wednesday, Friday nights    [provider]  prochlorperazine (COMPAZINE) 10 MG  tablet Take 1 tablet (10 mg total) by mouth every 6 (six) hours as needed (Nausea or vomiting). Patient not taking: Reported on 11/15/2020 06/11/20   Volanda Napoleon, MD  traMADol (ULTRAM) 50 MG tablet Take 50 mg by mouth every 6 (six) hours as needed. 07/10/20   [provider]    Physical Exam: Vitals:   12/26/20 1745 12/26/20 1845 12/26/20 2233 12/26/20 2237  BP: (!) 141/72 (!) 142/67    Pulse: 86 77    Resp: 18 16 16    Temp:   98.6 F (37 C)   TempSrc:   Oral   SpO2: 95% 97%    Weight:    78.3 kg  Height:    5\' 1"  (1.549 m)    Physical Exam Constitutional:      General: She is not in acute distress. HENT:     Head: Normocephalic and atraumatic.  Eyes:     Extraocular Movements: Extraocular movements intact.     Conjunctiva/sclera: Conjunctivae normal.  Cardiovascular:     Rate and Rhythm: Normal rate and regular rhythm.     Pulses: Normal pulses.  Pulmonary:     Effort: Pulmonary effort is normal. No respiratory distress.     Breath sounds: Normal breath sounds. No wheezing or rales.  Abdominal:     General: Bowel sounds are normal. There is no distension.     Palpations: Abdomen is soft.     Tenderness: There is no abdominal tenderness.  Musculoskeletal:        General: No swelling or tenderness.     Cervical back: Normal range of motion and neck supple.  Skin:    General: Skin is warm and dry.  Neurological:     General: No focal deficit present.     Mental Status: She is alert and oriented to person, place, and time.     Labs on Admission: I have personally reviewed following labs and imaging studies  CBC: Recent Labs  Lab 12/26/20 1755  WBC 9.0  NEUTROABS 4.3  HGB 12.8  HCT 39.4  MCV 98.7  PLT 751   Basic Metabolic Panel: Recent Labs  Lab 12/26/20 1755  NA 138  K 3.7  CL 104  CO2 23  GLUCOSE 83  BUN 10  CREATININE 0.55  CALCIUM 8.6*   GFR: Estimated Creatinine Clearance: 56.7 mL/min (by C-G formula based on SCr of 0.55  mg/dL). Liver Function Tests: Recent Labs  Lab 12/26/20 1755  AST 31  ALT 23  ALKPHOS 40  BILITOT 0.3  PROT 7.6  ALBUMIN 4.0   No results for input(s): LIPASE, AMYLASE in the last 168 hours. No results for input(s): AMMONIA in the last 168 hours. Coagulation Profile: No results for input(s): INR, PROTIME in the last 168 hours. Cardiac Enzymes: No results for input(s): CKTOTAL, CKMB, CKMBINDEX, TROPONINI in the last 168 hours. BNP (last 3 results) No results for input(s): PROBNP in the last 8760 hours. HbA1C: No results for input(s): HGBA1C in the last 72 hours. CBG: No results for input(s): GLUCAP in the last 168 hours. Lipid Profile: No results for input(s): CHOL, HDL, LDLCALC, TRIG, CHOLHDL, LDLDIRECT in the last 72 hours. Thyroid Function Tests: No results for input(s): TSH, T4TOTAL, FREET4, T3FREE, THYROIDAB in the last 72 hours. Anemia Panel: No results for input(s): VITAMINB12, FOLATE, FERRITIN, TIBC, IRON, RETICCTPCT in the last 72 hours. Urine analysis:    Component Value Date/Time   COLORURINE YELLOW 08/29/2020 1001   APPEARANCEUR CLEAR 08/29/2020 1001   LABSPEC 1.020 08/29/2020 1001   PHURINE 5.5 08/29/2020 1001   GLUCOSEU 100 (A) 08/29/2020 1001   HGBUR NEGATIVE 08/29/2020 1001   BILIRUBINUR NEGATIVE 08/29/2020 1001   BILIRUBINUR neg 05/16/2019 1446   KETONESUR NEGATIVE 08/29/2020 1001   PROTEINUR NEGATIVE 08/29/2020 1001   UROBILINOGEN 0.2 05/16/2019 1446   UROBILINOGEN 0.2 11/09/2017 1559   NITRITE NEGATIVE 08/29/2020 1001   LEUKOCYTESUR NEGATIVE 08/29/2020 1001    Radiological Exams on Admission: DG Chest 1 View  Result Date: 12/26/2020 CLINICAL DATA:  Shortness of breath EXAM: CHEST  1 VIEW COMPARISON:  04/19/2019 FINDINGS: Right Port-A-Cath in place with the tip at the cavoatrial junction. No pneumothorax. Heart and mediastinal contours are within normal limits. No focal opacities or effusions. No acute bony abnormality. IMPRESSION: No active  disease. Electronically Signed   By: Rolm Baptise M.D.   On: 12/26/2020 18:46    EKG: Independently reviewed.  Sinus rhythm, no acute ischemic changes.  Assessment/Plan Principal Problem:   Chest pain Active Problems:   Hypothyroidism   Essential hypertension   Diabetes mellitus due to underlying condition with unspecified complications ([place])   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 ([facility])   Chest pain/ concern for possible unstable angina  High-sensitivity troponin negative x2 and EKG without acute ischemic changes. Nuclear stress test done in December 2018 was normal.  Chest x-ray showing no active disease.  PE less likely given negative D-dimer, no tachycardia or hypoxia.  Does have risk factors for CAD and history concerning for possible unstable angina.  Patient is still complaining of substernal chest pressure. -Cardiac monitoring.  She was given full dose aspirin in the ED.  Start IV heparin.  Try sublingual nitroglycerin, if still having chest pain, may need nitro drip if blood pressure is able to tolerate.  Will give IV fluid hydration.  Trend troponin.  Echocardiogram ordered to assess for wall motion abnormalities.  Discussed with on-call cardiologist.  Stage IIIb adenocarcinoma of the sigmoid colon Niece states patient finished chemotherapy in January this year and is currently on observation. -Outpatient oncology follow-up.  Non-insulin-dependent type II diabetes -Check A1c.  Sliding scale insulin sensitive ACHS.  Hypertension: Stable. Hypothyroidism -Resume home meds after pharmacy med rec is done.  DVT prophylaxis: Heparin Code Status: Patient wishes to be full code. Family Communication: No family available at this time. Disposition Plan: Status is: Observation  The patient remains OBS appropriate and will d/c before 2 midnights.  Dispo: The patient is  from: Home              Anticipated d/c is to: Home              Patient currently is  not medically stable to d/c.   Difficult to place patient No  Level of care: Level of care: Telemetry Cardiac   The medical decision making on this patient was of high complexity and the patient is at high risk for clinical deterioration, therefore this is a level 3 visit.  Shela Leff MD [facility]  If 7PM-7AM, please contact night-coverage [email or web address]  12/27/2020, 12:11 AM

## 2020-12-26 NOTE — Plan of Care (Signed)

## 2020-12-26 NOTE — H&P (Incomplete)
History and Physical    Bailey Walker WCB:[id number] DOB: July 28, 1946 DOA: 12/26/2020  PCP: [name], MD Patient coming from: [facility]  Chief Complaint: Chest pain  HPI: Bailey Walker is a 75 y.o. 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  High-sensitivity troponin negative x2.  EKG without acute ischemic changes.  D-dimer negative.  No significant elevation of BNP.  Covid and influenza PCR negative.  Chest x-ray showing no active disease. Patient was given aspirin 324 mg.  ED Course: ***  Review of Systems:  All systems reviewed and apart from history of presenting illness, are negative.  Past Medical History:  Diagnosis Date  . 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 ([place]) 04/26/2020  . Diabetes mellitus without complication ([place])   . Goals of care, counseling/discussion 04/26/2020  . Hypertension   . SVT (supraventricular tachycardia) ([place])   . Thyroid disease     Past Surgical History:  Procedure Laterality Date  . APPENDECTOMY    . BIOPSY  04/18/2020   Procedure: BIOPSY;  Surgeon: Lavena Bullion, DO;  Location: [place] [facility];  Service: Gastroenterology;;  . BIOPSY  04/19/2020   Procedure: BIOPSY;  Surgeon: Lavena Bullion, DO;  Location: [place];  Service: Gastroenterology;;  . BREAST SURGERY     Fatty tissue on biopsy  . ESOPHAGOGASTRODUODENOSCOPY (EGD) WITH PROPOFOL N/A 04/18/2020   Procedure: ESOPHAGOGASTRODUODENOSCOPY (EGD) WITH PROPOFOL;  Surgeon: Lavena Bullion, DO;  Location: [place];  Service: Gastroenterology;  Laterality: N/A;  . EYE SURGERY Bilateral    april and march 2019 for cataracts.   . fatty gland     left wrist , right breast  . FLEXIBLE SIGMOIDOSCOPY N/A 04/18/2020    Procedure: FLEXIBLE SIGMOIDOSCOPY;  Surgeon: Lavena Bullion, DO;  Location: [place];  Service: Gastroenterology;  Laterality: N/A;  . FLEXIBLE SIGMOIDOSCOPY N/A 04/19/2020   Procedure: FLEXIBLE SIGMOIDOSCOPY;  Surgeon: Lavena Bullion, DO;  Location: [place];  Service: Gastroenterology;  Laterality: N/A;  . INCISION AND DRAINAGE OF WOUND N/A 05/01/2020   Procedure: ABDOMINAL  WOUND EXPLORATION; IRRIGATION AND DEBRIDEMENT WOUND;  Surgeon: Rolm Bookbinder, MD;  Location: [place];  Service: General;  Laterality: N/A;  . IR IMAGING GUIDED PORT INSERTION  06/13/2020  . KNEE SURGERY     left knee  . LAPAROTOMY N/A 04/20/2020   Procedure: SIGMOID COLECTOMY AND COLOSTOMY;  Surgeon: Coralie Keens, MD;  Location: [place];  Service: General;  Laterality: N/A;  . SUBMUCOSAL TATTOO INJECTION  04/19/2020   Procedure: SUBMUCOSAL TATTOO INJECTION;  Surgeon: Lavena Bullion, DO;  Location: [place];  Service: Gastroenterology;;     reports that she quit smoking about 6 years ago. She has never used smokeless tobacco. She reports that she does not drink alcohol and does not use drugs.  Allergies  Allergen Reactions  . Nitroglycerin Nausea And Vomiting  . Ampicillin Other (See Comments)    Per allergy test    Family History  Problem Relation Age of Onset  . Breast cancer Cousin        mat and pat sides  . Cancer Sister        cancer everywhere  . Colon cancer Neg Hx   . Esophageal cancer Neg Hx   . Rectal cancer Neg Hx     Prior  to Admission medications   Medication Sig Start Date End Date Taking? Authorizing Provider  alendronate (FOSAMAX) 70 MG tablet Take 70 mg by mouth every Sunday.  Patient not taking: Reported on 11/15/2020 03/01/20   [provider]  aspirin EC 81 MG tablet Take 81 mg by mouth daily. Swallow whole.    [provider]  denosumab (PROLIA) 60 MG/ML SOSY injection Inject 60 mg into the skin every 6 (six) months.  Patient not taking:  Reported on 11/15/2020    [provider]  dexamethasone (DECADRON) 4 MG tablet Take 2 tablets (8 mg total) by mouth daily. Start the day after chemotherapy for 2 days. Take with food. Patient not taking: Reported on 11/15/2020 10/16/20   Volanda Napoleon, MD  DEXILANT 30 MG capsule TAKE 1 CAPSULE BY MOUTH EVERY DAY Patient taking differently: Take 30 mg by mouth daily. 10/20/19   Tresa Garter, MD  docusate sodium (COLACE) 100 MG capsule Take 1 capsule (100 mg total) by mouth 2 (two) times daily. 05/02/20   Raiford Noble Latif, DO  feeding supplement, ENSURE ENLIVE, (ENSURE ENLIVE) LIQD Take 237 mLs by mouth 3 (three) times daily between meals. 05/02/20   Raiford Noble Latif, DO  gabapentin (NEURONTIN) 100 MG capsule Take 100 mg by mouth 3 (three) times daily.  Patient not taking: Reported on 11/15/2020 07/12/20   [provider]  glipiZIDE (GLUCOTROL) 5 MG tablet TAKE 1 TABLET BY MOUTH TWICE A DAY BEFORE MEALS Patient taking differently: Take 5 mg by mouth See admin instructions. Take one tablet (5 mg) by mouth twice daily - after lunch and at midnight 11/04/19   Dorena Dew, FNP  levothyroxine (SYNTHROID) 75 MCG tablet TAKE 1 TABLET BY MOUTH EVERY DAY Patient taking differently: Take 75 mcg by mouth daily before breakfast. 01/02/20   Dorena Dew, FNP  lidocaine-prilocaine (EMLA) cream Apply to affected area once 06/11/20   Volanda Napoleon, MD  metFORMIN (GLUCOPHAGE) 500 MG tablet Take 0.5 tablets (250 mg total) by mouth 2 (two) times daily with a meal. Patient taking differently: Take 500 mg by mouth See admin instructions. Take one tablet (500 mg) by mouth twice daily - after lunch and at midnight 05/16/19   [name], FNP  methocarbamol (ROBAXIN) 500 MG tablet TAKE 1 TABLET BY MOUTH EVERY 12 HOURS AS NEEDED FOR MUSCLE SPASMS. 11/20/20   Volanda Napoleon, MD  metoprolol tartrate (LOPRESSOR) 25 MG tablet Take 1 tablet (25 mg total) by mouth 2 (two) times daily.  08/02/19   Tresa Garter, MD  Multiple Vitamin (MULTIVITAMIN WITH MINERALS) TABS tablet Take 1 tablet by mouth daily. 05/02/20   Raiford Noble Latif, DO  mupirocin ointment (BACTROBAN) 2 % Apply 1 application topically 2 (two) times daily. Patient not taking: Reported on 11/15/2020 08/15/20   Volanda Napoleon, MD  nitroGLYCERIN (NITROSTAT) 0.4 MG SL tablet Place 1 tablet (0.4 mg total) under the tongue every 5 (five) minutes as needed for chest pain. 05/16/19 04/16/20  [name], [facility]    [provider]  nystatin (MYCOSTATIN/NYSTOP) powder Apply topically. 07/11/20   [provider]  ondansetron (ZOFRAN) 4 MG tablet Take 1 tablet (4 mg total) by mouth every 6 (six) hours as needed for nausea. Patient not taking: Reported on 11/15/2020 05/02/20   Raiford Noble Latif, DO  ondansetron (ZOFRAN) 8 MG tablet Take 1 tablet (8 mg total) by mouth 2 (two) times daily as needed for refractory  nausea / vomiting. Start on day 3 after chemotherapy. Patient not taking: Reported on 11/15/2020 06/11/20   Volanda Napoleon, MD  prednisoLONE acetate (PRED FORTE) 1 % ophthalmic suspension Place 1 drop into both eyes See admin instructions. Instill one drop into both eyes on Monday, Wednesday, Friday nights    [provider]  prochlorperazine (COMPAZINE) 10 MG tablet Take 1 tablet (10 mg total) by mouth every 6 (six) hours as needed (Nausea or vomiting). Patient not taking: Reported on 11/15/2020 06/11/20   Volanda Napoleon, MD  traMADol (ULTRAM) 50 MG tablet Take 50 mg by mouth every 6 (six) hours as needed. 07/10/20   [provider]    Physical Exam: Vitals:   12/26/20 1745 12/26/20 1845 12/26/20 2233 12/26/20 2237  BP: (!) 141/72 (!) 142/67    Pulse: 86 77    Resp: 18 16 16    Temp:   98.6 F (37 C)   TempSrc:   Oral   SpO2: 95% 97%    Weight:    78.3 kg  Height:    5\' 1"  (1.549 m)    ***  Labs on Admission: I have  personally reviewed following labs and imaging studies  CBC: Recent Labs  Lab 12/26/20 1755  WBC 9.0  NEUTROABS 4.3  HGB 12.8  HCT 39.4  MCV 98.7  PLT 564   Basic Metabolic Panel: Recent Labs  Lab 12/26/20 1755  NA 138  K 3.7  CL 104  CO2 23  GLUCOSE 83  BUN 10  CREATININE 0.55  CALCIUM 8.6*   GFR: Estimated Creatinine Clearance: 56.7 mL/min (by C-G formula based on SCr of 0.55 mg/dL). Liver Function Tests: Recent Labs  Lab 12/26/20 1755  AST 31  ALT 23  ALKPHOS 40  BILITOT 0.3  PROT 7.6  ALBUMIN 4.0   No results for input(s): LIPASE, AMYLASE in the last 168 hours. No results for input(s): AMMONIA in the last 168 hours. Coagulation Profile: No results for input(s): INR, PROTIME in the last 168 hours. Cardiac Enzymes: No results for input(s): CKTOTAL, CKMB, CKMBINDEX, TROPONINI in the last 168 hours. BNP (last 3 results) No results for input(s): PROBNP in the last 8760 hours. HbA1C: No results for input(s): HGBA1C in the last 72 hours. CBG: No results for input(s): GLUCAP in the last 168 hours. Lipid Profile: No results for input(s): CHOL, HDL, LDLCALC, TRIG, CHOLHDL, LDLDIRECT in the last 72 hours. Thyroid Function Tests: No results for input(s): TSH, T4TOTAL, FREET4, T3FREE, THYROIDAB in the last 72 hours. Anemia Panel: No results for input(s): VITAMINB12, FOLATE, FERRITIN, TIBC, IRON, RETICCTPCT in the last 72 hours. Urine analysis:    Component Value Date/Time   COLORURINE YELLOW 08/29/2020 1001   APPEARANCEUR CLEAR 08/29/2020 1001   LABSPEC 1.020 08/29/2020 1001   PHURINE 5.5 08/29/2020 1001   GLUCOSEU 100 (A) 08/29/2020 1001   HGBUR NEGATIVE 08/29/2020 1001   BILIRUBINUR NEGATIVE 08/29/2020 1001   BILIRUBINUR neg 05/16/2019 1446   KETONESUR NEGATIVE 08/29/2020 1001   PROTEINUR NEGATIVE 08/29/2020 1001   UROBILINOGEN 0.2 05/16/2019 1446   UROBILINOGEN 0.2 11/09/2017 1559   NITRITE NEGATIVE 08/29/2020 1001   LEUKOCYTESUR NEGATIVE 08/29/2020  1001    Radiological Exams on Admission: DG Chest 1 View  Result Date: 12/26/2020 CLINICAL DATA:  Shortness of breath EXAM: CHEST  1 VIEW COMPARISON:  04/19/2019 FINDINGS: Right Port-A-Cath in place with the tip at the cavoatrial junction. No pneumothorax. Heart and mediastinal contours are within normal limits. No focal opacities or effusions.  No acute bony abnormality. IMPRESSION: No active disease. Electronically Signed   By: Rolm Baptise M.D.   On: 12/26/2020 18:46    EKG: Independently reviewed.  Sinus rhythm, no acute ischemic changes.  Assessment/Plan Active Problems:   Chest pain   Chest pain ACS less likely as high-sensitivity troponin negative x2 and EKG without acute ischemic changes. Nuclear stress test done in December 2018 was normal.  PE less likely given negative D-dimer.  Not tachycardic or hypoxic.  Currently*** -Cardiac monitoring.  Echocardiogram ordered to assess for wall motion abnormalities.  Stage IIIb adenocarcinoma of the sigmoid colon -Outpatient oncology follow-up.  Non-insulin-dependent type II diabetes, hypertension, hypothyroidism, obesity (BMI 32.61) presented to the ED with complaints of chest pain and shortness of breath.  High-sensitivity troponin negative x2.  EKG without acute ischemic changes.  D-dimer negative.  No significant elevation of BNP.  Covid and influenza PCR negative.  Chest x-ray showing no active disease. Patient was given aspirin 324 mg.   *** HIV screening*** The patient falls between the ages of 13-64 and should be screened for HIV, therefore HIV testing ordered.  DVT prophylaxis: ***  Code Status: *** Family Communication: ***  Disposition Plan: Status is: Observation  {Observation:23811}  Dispo: The patient is from: {From:23814}              Anticipated d/c is to: {To:23815}              Patient currently {Medically stable:23817}   Difficult to place patient {Yes/No:25151}       Consults called: *** Admission  status: *** Level of care: Level of care: Telemetry Cardiac The medical decision making on this patient was of high complexity and the patient is at high risk for clinical deterioration, therefore this is a level 3 visit.***  The medical decision making is of moderate complexity, therefore this is a level 2 visit.***  Shela Leff MD [facility]  If 7PM-7AM, please contact night-coverage [email or web address]  12/26/2020, 11:47 PM

## 2020-12-26 NOTE — ED Provider Notes (Signed)
[facility] EMERGENCY DEPARTMENT Provider Note   CSN: [id number] Arrival date & time: 12/26/20  1636     History No chief complaint on file.   Macala Kwanza Cancelliere is a 75 y.o. female.  HPI     75 year old female with history of cancer of the sigmoid colon with metastases to intra-abdominal lymph node currently on FOLFOX, diabetes, hypertension, SVT, thyroid disease, who presents with concern for chest pain or shortness of breath.  Speaks Ar[place]ic from [place] and daughter is translating per their preference Began 2-3 hours ago, pressure middle of chest, associated shortness of breath, lightheadedness, middle of chest without radiation, no nausea or vomiting Has had diaphoresis No cough, no fever, no leg pain or swelling, sometimes feels like it is but it is not Cant lay flat at baseline, chest pressure, worsens with exertion  Has had similar pain without work up Scheduled to see Cardiology for presurgery eval but does not see Cardiologist now  No long trips, hx of blood clots, recent surgeries  No known heart hx, did have stress test in 2018  No fam hx of CAD Used to smoke quit in 2013, no other etoh or drugs    Past Medical History:  Diagnosis Date  . 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 ([place]) 04/26/2020  . Diabetes mellitus without complication ([place])   . Goals of care, counseling/discussion 04/26/2020  . Hypertension   . SVT (supraventricular tachycardia) ([place])   . Thyroid disease     Patient Active Problem List   Diagnosis Date Noted  . 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 ([place]) 04/26/2020  . Goals of care, counseling/discussion 04/26/2020  . Gastritis and gastroduodenitis   . Grade II internal hemorrhoids   . Abdominal distension   . Nausea and vomiting in adult   . Abnormal CT scan, gastrointestinal tract   . Colon obstruction ([place]) 04/16/2020  . UTI (urinary tract infection) 04/16/2020  .  Dyspnea on exertion 01/27/2020  . Iron deficiency anemia 01/26/2020  . Vitamin B12 deficiency anemia 01/26/2020  . Pseudophakia of both eyes 05/18/2019  . 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 Fuchs' corneal dystrophy 09/20/2018  . 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 Diabetes mellitus due to underlying condition with unspecified complications ([place]) 50/35/4656  . Ex-smoker 08/26/2017  . Atypical chest pain 08/26/2017  . Essential hypertension 01/11/2017  . Hypothyroidism 01/08/2017  . SVT (supraventricular tachycardia) ([place]) 01/08/2017    Past Surgical History:  Procedure Laterality Date  . APPENDECTOMY    . BIOPSY  04/18/2020   Procedure: BIOPSY;  Surgeon: Lavena Bullion, DO;  Location: [facility] [facility];  Service: Gastroenterology;;  . BIOPSY  04/19/2020   Procedure: BIOPSY;  Surgeon: Lavena Bullion, DO;  Location: [place];  Service: Gastroenterology;;  . BREAST SURGERY     Fatty tissue on biopsy  . ESOPHAGOGASTRODUODENOSCOPY (EGD) WITH PROPOFOL N/A 04/18/2020   Procedure: ESOPHAGOGASTRODUODENOSCOPY (EGD) WITH PROPOFOL;  Surgeon: Lavena Bullion, DO;  Location: [place];  Service: Gastroenterology;  Laterality: N/A;  . EYE SURGERY Bilateral    april and march 2019 for cataracts.   . fatty gland     left wrist , right breast  . FLEXIBLE SIGMOIDOSCOPY N/A 04/18/2020   Procedure: FLEXIBLE SIGMOIDOSCOPY;  Surgeon: Lavena Bullion, DO;  Location: [place];  Service: Gastroenterology;  Laterality: N/A;  . FLEXIBLE SIGMOIDOSCOPY N/A 04/19/2020  Procedure: FLEXIBLE SIGMOIDOSCOPY;  Surgeon: Lavena Bullion, DO;  Location: [place] [facility] [facility];  Service: Gastroenterology;  Laterality: N/A;  . INCISION AND DRAINAGE OF WOUND N/A 05/01/2020   Procedure: ABDOMINAL  WOUND EXPLORATION; IRRIGATION AND DEBRIDEMENT WOUND;  Surgeon: Rolm Bookbinder, MD;  Location: [place];  Service: General;  Laterality: N/A;  . IR IMAGING GUIDED PORT INSERTION  06/13/2020  . KNEE SURGERY     left knee  . LAPAROTOMY N/A 04/20/2020   Procedure: SIGMOID COLECTOMY AND COLOSTOMY;  Surgeon: Coralie Keens, MD;  Location: [place];  Service: General;  Laterality: N/A;  . SUBMUCOSAL TATTOO INJECTION  04/19/2020   Procedure: SUBMUCOSAL TATTOO INJECTION;  Surgeon: Lavena Bullion, DO;  Location: [place] [facility];  Service: Gastroenterology;;     OB History   No obstetric history on file.     Family History  Problem Relation Age of Onset  . Breast cancer Cousin        mat and pat sides  . Cancer Sister        cancer everywhere  . Colon cancer Neg Hx   . Esophageal cancer Neg Hx   . Rectal cancer Neg Hx     Social History   Tobacco Use  . Smoking status: Former Smoker    Quit date: 12/29/2013    Years since quitting: 6.9  . Smokeless tobacco: Never Used  Vaping Use  . Vaping Use: Never used  Substance Use Topics  . Alcohol use: No  . Drug use: No    Home Medications Prior to Admission medications   Medication Sig Start Date End Date Taking? Authorizing Provider  alendronate (FOSAMAX) 70 MG tablet Take 70 mg by mouth every Sunday.  Patient not taking: Reported on 11/15/2020 03/01/20   [provider]  aspirin EC 81 MG tablet Take 81 mg by mouth daily. Swallow whole.    [provider]  denosumab (PROLIA) 60 MG/ML SOSY injection Inject 60 mg into the skin every 6 (six) months.  Patient not taking: Reported on 11/15/2020    [provider]  dexamethasone (DECADRON) 4 MG tablet Take 2 tablets (8 mg total) by mouth daily. Start the day after chemotherapy for 2 days. Take with food. Patient not taking: Reported on 11/15/2020 10/16/20   Volanda Napoleon, MD  DEXILANT 30 MG capsule TAKE 1 CAPSULE BY MOUTH EVERY DAY Patient taking differently: Take 30 mg by mouth daily. 10/20/19   Tresa Garter, MD  docusate sodium (COLACE) 100 MG capsule Take  1 capsule (100 mg total) by mouth 2 (two) times daily. 05/02/20   Raiford Noble Latif, DO  feeding supplement, ENSURE ENLIVE, (ENSURE ENLIVE) LIQD Take 237 mLs by mouth 3 (three) times daily between meals. 05/02/20   Raiford Noble Latif, DO  gabapentin (NEURONTIN) 100 MG capsule Take 100 mg by mouth 3 (three) times daily.  Patient not taking: Reported on 11/15/2020 07/12/20   [provider]  glipiZIDE (GLUCOTROL) 5 MG tablet TAKE 1 TABLET BY MOUTH TWICE A DAY BEFORE MEALS Patient taking differently: Take 5 mg by mouth See admin instructions. Take one tablet (5 mg) by mouth twice daily - after lunch and at midnight 11/04/19   Dorena Dew, FNP  levothyroxine (SYNTHROID) 75 MCG tablet TAKE 1 TABLET BY MOUTH EVERY DAY Patient taking differently: Take 75 mcg by mouth daily before breakfast. 01/02/20   Dorena Dew, FNP  lidocaine-prilocaine (EMLA) cream Apply to affected area once 06/11/20   Marin Olp, Rudell Cobb, MD  metFORMIN (GLUCOPHAGE) 500 MG tablet Take 0.5 tablets (250 mg total) by mouth 2 (two) times daily with a meal. Patient taking differently: Take 500 mg by mouth See admin instructions. Take one tablet (500 mg) by mouth twice daily - after lunch and at midnight 05/16/19   [name], FNP  methocarbamol (ROBAXIN) 500 MG tablet TAKE 1 TABLET BY MOUTH EVERY 12 HOURS AS NEEDED FOR MUSCLE SPASMS. 11/20/20   Volanda Napoleon, MD  metoprolol tartrate (LOPRESSOR) 25 MG tablet Take 1 tablet (25 mg total) by mouth 2 (two) times daily. 08/02/19   Tresa Garter, MD  Multiple Vitamin (MULTIVITAMIN WITH MINERALS) TABS tablet Take 1 tablet by mouth daily. 05/02/20   Raiford Noble Latif, DO  mupirocin ointment (BACTROBAN) 2 % Apply 1 application topically 2 (two) times daily. Patient not taking: Reported on 11/15/2020 08/15/20   Volanda Napoleon, MD  nitroGLYCERIN (NITROSTAT) 0.4 MG SL tablet Place 1 tablet (0.4 mg total) under the tongue every 5 (five) minutes as needed for chest pain.  05/16/19 04/16/20  [name], [facility]    [provider]  nystatin (MYCOSTATIN/NYSTOP) powder Apply topically. 07/11/20   [provider]  ondansetron (ZOFRAN) 4 MG tablet Take 1 tablet (4 mg total) by mouth every 6 (six) hours as needed for nausea. Patient not taking: Reported on 11/15/2020 05/02/20   Raiford Noble Latif, DO  ondansetron (ZOFRAN) 8 MG tablet Take 1 tablet (8 mg total) by mouth 2 (two) times daily as needed for refractory nausea / vomiting. Start on day 3 after chemotherapy. Patient not taking: Reported on 11/15/2020 06/11/20   Volanda Napoleon, MD  prednisoLONE acetate (PRED FORTE) 1 % ophthalmic suspension Place 1 drop into both eyes See admin instructions. Instill one drop into both eyes on Monday, Wednesday, Friday nights    [provider]  prochlorperazine (COMPAZINE) 10 MG tablet Take 1 tablet (10 mg total) by mouth every 6 (six) hours as needed (Nausea or vomiting). Patient not taking: Reported on 11/15/2020 06/11/20   Volanda Napoleon, MD  traMADol (ULTRAM) 50 MG tablet Take 50 mg by mouth every 6 (six) hours as needed. 07/10/20   [provider]    Allergies    Nitroglycerin and Ampicillin  Review of Systems   Review of Systems  Constitutional: Negative for fever.  HENT: Negative for sore throat.   Eyes: Negative for visual disturbance.  Respiratory: Positive for shortness of breath. Negative for cough.   Cardiovascular: Positive for chest pain.  Gastrointestinal: Negative for abdominal pain.  Genitourinary: Negative for difficulty urinating.  Musculoskeletal: Negative for back pain and neck pain.  Skin: Negative for rash.  Neurological: Negative for syncope and headaches.    Physical Exam Updated Vital Signs There were no vitals taken for this visit.  Physical Exam Vitals and nursing note reviewed.  Constitutional:      General: She is not in acute distress.    Appearance: She  is well-developed. She is not diaphoretic.  HENT:     Head: Normocephalic and atraumatic.  Eyes:     Conjunctiva/sclera: Conjunctivae normal.  Cardiovascular:     Rate and Rhythm: Normal rate and regular rhythm.     Heart sounds: Normal heart sounds. No murmur heard. No friction rub. No gallop.   Pulmonary:     Effort: Pulmonary effort is normal. No respiratory distress.     Breath sounds: Normal breath sounds. No wheezing or rales.  Abdominal:  General: There is no distension.     Palpations: Abdomen is soft.     Tenderness: There is no abdominal tenderness. There is no guarding.  Musculoskeletal:        General: No tenderness.     Cervical back: Normal range of motion.  Skin:    General: Skin is warm and dry.     Findings: No erythema or rash.  Neurological:     Mental Status: She is alert and oriented to person, place, and time.     ED Results / Procedures / Treatments   Labs (all labs ordered are listed, but only abnormal results are displayed) Labs Reviewed - No data to display  EKG None  Radiology No results found.  Procedures Procedures   Medications Ordered in ED Medications - No data to display  ED Course  I have reviewed the triage vital signs and the nursing notes.  Pertinent labs & imaging results that were available during my care of the patient were reviewed by me and considered in my medical decision making (see chart for details).    MDM Rules/Calculators/A&P                          75 year old female who speaks Aramaic with history of cancer of the sigmoid colon with metastases to intra-abdominal lymph node currently on FOLFOX, diabetes, hypertension, SVT, thyroid disease, who presents with concern for chest pain or shortness of breath. Differential diagnosis for chest pain includes pulmonary embolus, dissection, pneumothorax, pneumonia, ACS, myocarditis, pericarditis.  EKG was done and evaluate by me and showed no acute ST changes and no  signs of pericarditis. Chest x-ray was done and evaluated by me and radiology and showed no sign of pneumonia or pneumothorax. Patient with negative ddimer and have low suspicion for PE.  History and physical exam not consistent with aortic dissection.   HEART score is 6, concerning history. Admitted for further chest pain evaluation.  Final Clinical Impression(s) / ED Diagnoses Final diagnoses:  None    Rx / DC Orders ED Discharge Orders    None       Gareth Morgan, MD 12/27/20 [phone]

## 2020-12-27 ENCOUNTER — Encounter (HOSPITAL_COMMUNITY): Payer: Self-pay | Admitting: Family Medicine

## 2020-12-27 ENCOUNTER — Observation Stay (HOSPITAL_BASED_OUTPATIENT_CLINIC_OR_DEPARTMENT_OTHER): Payer: Medicaid Other

## 2020-12-27 DIAGNOSIS — R079 Chest pain, unspecified: Secondary | ICD-10-CM

## 2020-12-27 DIAGNOSIS — I361 Nonrheumatic tricuspid (valve) insufficiency: Secondary | ICD-10-CM | POA: Diagnosis not present

## 2020-12-27 LAB — ECHOCARDIOGRAM COMPLETE
AR max vel: 2.16 cm2
AV Area VTI: 2.01 cm2
AV Area mean vel: 1.97 cm2
AV Mean grad: 4 mmHg
AV Peak grad: 7.2 mmHg
Ao pk vel: 1.34 m/s
Area-P 1/2: 2.73 cm2
Height: 61 in
MV VTI: 3.06 cm2
S' Lateral: 4 cm
Weight: 2761.6 oz

## 2020-12-27 LAB — GLUCOSE, CAPILLARY
Glucose-Capillary: 88 mg/dL (ref 70–99)
Glucose-Capillary: 147 mg/dL — ABNORMAL HIGH (ref 70–99)
Glucose-Capillary: 120 mg/dL — ABNORMAL HIGH (ref 70–99)
Glucose-Capillary: 153 mg/dL — ABNORMAL HIGH (ref 70–99)
Glucose-Capillary: 191 mg/dL — ABNORMAL HIGH (ref 70–99)

## 2020-12-27 LAB — HEPARIN LEVEL (UNFRACTIONATED)
Heparin Unfractionated: 0.14 IU/mL — ABNORMAL LOW (ref 0.30–0.70)
Heparin Unfractionated: 0.32 IU/mL (ref 0.30–0.70)

## 2020-12-27 LAB — LIPID PANEL
Cholesterol: 207 mg/dL — ABNORMAL HIGH (ref 0–200)
HDL: 50 mg/dL (ref >40)
LDL Cholesterol: 109 mg/dL — ABNORMAL HIGH (ref 0–99)
Total CHOL/HDL Ratio: 4.1 RATIO
Triglycerides: 240 mg/dL — ABNORMAL HIGH (ref <150)
VLDL: 48 mg/dL — ABNORMAL HIGH (ref 0–40)

## 2020-12-27 LAB — HEMOGLOBIN A1C
Hgb A1c MFr Bld: 6.9 % — ABNORMAL HIGH (ref 4.8–5.6)
Mean Plasma Glucose: 151.33 mg/dL

## 2020-12-27 LAB — TROPONIN I (HIGH SENSITIVITY)
Troponin I (High Sensitivity): 6 ng/L (ref <18)
Troponin I (High Sensitivity): 7 ng/L (ref <18)
Troponin I (High Sensitivity): 6 ng/L (ref <18)

## 2020-12-27 MED ORDER — METOPROLOL TARTRATE 25 MG PO TABS
25.0000 mg | ORAL_TABLET | Freq: Two times a day (BID) | ORAL | Status: DC
  Administered 2020-12-27 – 2020-12-28 (×3): 25 mg via ORAL
  Filled 2020-12-27 (×3): qty 1

## 2020-12-27 MED ORDER — SODIUM CHLORIDE 0.9 % IV BOLUS
1000.0000 mL | Freq: Once | INTRAVENOUS | Status: AC
  Administered 2020-12-27: 1000 mL via INTRAVENOUS

## 2020-12-27 MED ORDER — CHLORHEXIDINE GLUCONATE CLOTH 2 % EX PADS
6.0000 | MEDICATED_PAD | Freq: Every day | CUTANEOUS | Status: DC
  Administered 2020-12-28: 6 via TOPICAL

## 2020-12-27 MED ORDER — LIDOCAINE-PRILOCAINE 2.5-2.5 % EX CREA
TOPICAL_CREAM | Freq: Once | CUTANEOUS | Status: AC
  Administered 2020-12-27: 1 via TOPICAL
  Filled 2020-12-27: qty 5

## 2020-12-27 MED ORDER — INSULIN ASPART 100 UNIT/ML ~~LOC~~ SOLN
0.0000 [IU] | Freq: Three times a day (TID) | SUBCUTANEOUS | Status: DC
  Administered 2020-12-27: 1 [IU] via SUBCUTANEOUS
  Administered 2020-12-27: 2 [IU] via SUBCUTANEOUS
  Administered 2020-12-28 (×2): 1 [IU] via SUBCUTANEOUS

## 2020-12-27 MED ORDER — METOPROLOL TARTRATE 100 MG PO TABS: 100.0000 mg | ORAL_TABLET | Freq: Once | ORAL | Status: DC

## 2020-12-27 MED ORDER — PREDNISOLONE ACETATE 1 % OP SUSP
1.0000 [drp] | OPHTHALMIC | Status: DC
  Filled 2020-12-27: qty 5

## 2020-12-27 MED ORDER — PANTOPRAZOLE SODIUM 40 MG PO TBEC
40.0000 mg | DELAYED_RELEASE_TABLET | Freq: Every day | ORAL | Status: DC
  Administered 2020-12-27 – 2020-12-28 (×2): 40 mg via ORAL
  Filled 2020-12-27 (×2): qty 1

## 2020-12-27 MED ORDER — ENOXAPARIN SODIUM 40 MG/0.4ML ~~LOC~~ SOLN: 40.0000 mg | SUBCUTANEOUS | Status: DC

## 2020-12-27 MED ORDER — METOPROLOL TARTRATE 100 MG PO TABS
100.0000 mg | ORAL_TABLET | Freq: Once | ORAL | Status: AC
  Administered 2020-12-28: 100 mg via ORAL
  Filled 2020-12-27: qty 1

## 2020-12-27 MED ORDER — ONDANSETRON HCL 4 MG/2ML IJ SOLN: 4.0000 mg | Freq: Four times a day (QID) | INTRAMUSCULAR | Status: DC | PRN

## 2020-12-27 MED ORDER — INSULIN ASPART 100 UNIT/ML ~~LOC~~ SOLN: 0.0000 [IU] | Freq: Every day | SUBCUTANEOUS | Status: DC

## 2020-12-27 MED ORDER — LEVOTHYROXINE SODIUM 75 MCG PO TABS
75.0000 ug | ORAL_TABLET | Freq: Every day | ORAL | Status: DC
  Administered 2020-12-28: 75 ug via ORAL
  Filled 2020-12-27: qty 1

## 2020-12-27 MED ORDER — NITROGLYCERIN 0.4 MG SL SUBL: 0.4000 mg | SUBLINGUAL_TABLET | SUBLINGUAL | Status: DC | PRN

## 2020-12-27 MED ORDER — ENOXAPARIN SODIUM 40 MG/0.4ML ~~LOC~~ SOLN
40.0000 mg | SUBCUTANEOUS | Status: DC
  Administered 2020-12-27: 40 mg via SUBCUTANEOUS
  Filled 2020-12-27: qty 0.4

## 2020-12-27 MED ORDER — ACETAMINOPHEN 325 MG PO TABS: 650.0000 mg | ORAL_TABLET | ORAL | Status: DC | PRN

## 2020-12-27 MED ORDER — HEPARIN BOLUS VIA INFUSION
2000.0000 [IU] | Freq: Once | INTRAVENOUS | Status: AC
  Administered 2020-12-27: 2000 [IU] via INTRAVENOUS
  Filled 2020-12-27: qty 2000

## 2020-12-27 MED ORDER — HEPARIN (PORCINE) 25000 UT/250ML-% IV SOLN
1000.0000 [IU]/h | INTRAVENOUS | Status: DC
  Administered 2020-12-27: 800 [IU]/h via INTRAVENOUS
  Filled 2020-12-27: qty 250

## 2020-12-27 MED ORDER — ATORVASTATIN CALCIUM 10 MG PO TABS
20.0000 mg | ORAL_TABLET | Freq: Every day | ORAL | Status: DC
  Administered 2020-12-27 – 2020-12-28 (×2): 20 mg via ORAL
  Filled 2020-12-27 (×2): qty 2

## 2020-12-27 MED ORDER — SODIUM CHLORIDE 0.9% FLUSH
10.0000 mL | INTRAVENOUS | Status: DC | PRN
  Administered 2020-12-28: 10 mL

## 2020-12-27 MED ORDER — HEPARIN BOLUS VIA INFUSION
4000.0000 [IU] | Freq: Once | INTRAVENOUS | Status: AC
  Administered 2020-12-27: 4000 [IU] via INTRAVENOUS
  Filled 2020-12-27: qty 4000

## 2020-12-27 NOTE — Progress Notes (Signed)
[facility] for Heparin  Indication: chest pain/ACS  Allergies  Allergen Reactions  . Nitroglycerin Nausea And Vomiting  . Ampicillin Other (See Comments)    Per allergy test    Patient Measurements: Height: 5\' 1"  (154.9 cm) Weight: 78.3 kg (172 lb 9.6 oz) IBW/kg (Calculated) : 47.8  Vital Signs: Temp: 98.2 F (36.8 C) (04/07 0907) Temp Source: Oral (04/07 0907) BP: 144/66 (04/07 0907) Pulse Rate: 83 (04/07 0907)  Labs: Recent Labs    12/26/20 1755 12/26/20 2240 12/27/20 0130 12/27/20 0619 12/27/20 1020  HGB 12.8  --   --   --   --   HCT 39.4  --   --   --   --   PLT 210  --   --   --   --   HEPARINUNFRC  --   --   --   --  0.14*  CREATININE 0.55  --   --   --   --   TROPONINIHS 4 5 6 7   --     Estimated Creatinine Clearance: 56.7 mL/min (by C-G formula based on SCr of 0.55 mg/dL).   Medical History: Past Medical History:  Diagnosis Date  . 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 Diabetes mellitus without complication ([place])   . Goals of care, counseling/discussion 04/26/2020  . Hypertension   . SVT (supraventricular tachycardia) ([place])   . Thyroid disease      Assessment: 75 y/o F on heparin for chest pain. No anticoagulants noted PTA. Heparin level = 0.14, hg= 12.8  Goal of Therapy:  Heparin level 0.3-0.7 units/ml Monitor platelets by anticoagulation protocol: Yes   Plan:  -Heparin bolus 2000 units then increase infusion to 1000 units/hr -Heparin level in 8 hours and daily wth CBC daily  Hildred Laser, PharmD Clinical Pharmacist **Pharmacist phone directory can now be found on [email or web address] ([email or web address]).  Listed under [place].

## 2020-12-27 NOTE — Progress Notes (Signed)
  Echocardiogram 2D Echocardiogram has been performed.  Bailey Walker 12/27/2020, 3:46 PM

## 2020-12-27 NOTE — Progress Notes (Signed)
VAST consulted to obtain 18 g IV access in patient's [facility] for cardiac CTA. Patient has a power implanted port in chest. Contacted Cardiology PA to request use of port instead of utilizing pt's veins. PA contacted CT before approving access of port. VAST to access and assess port tonight so access will be available when needed for CT on 4/8.  Spoke with pt's nurse Sula Soda; requested she notify VAST when pt's family member who speaks English is back in room to determine if EMLA is used with port access for patient.  Sula Soda, RN contacted VAST and verbalized that patient needs EMLA ordered. She will place EMLA and tegaderm over port site and notify VAST when complete. VAST will respond after EMLA has been in place for 1 hour as indicated for full effect.

## 2020-12-27 NOTE — Progress Notes (Signed)
PROGRESS NOTE  Bailey Walker NKN:[id number] DOB: [birth year] DOA: 12/26/2020 PCP: [name], MD  HPI/Recap of past 24 hours:  Bailey Walker is a 75 y.o. 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  High-sensitivity troponin negative x2.  EKG without acute ischemic changes.  D-dimer negative.  No significant elevation of BNP.  Covid and influenza PCR negative.  Chest x-ray showing no active disease. Patient was given aspirin 324 mg.   Patient speaks Aramaic.  Language interpretation done by her niece at bedside (per patient preference).  Niece states she was at work yesterday and in the afternoon around 3:30 PM patient called her to let her know that she was having chest pressure, dizziness, and shortness of breath.  Niece then went home and took her to the emergency room to be evaluated.  States she has been complaining of chest pain on and off since 2013 when she quit smoking.  She was seen by her cardiologist a few years ago and told that her heart was normal.  She usually experiences chest pain with exertion but yesterday it was at rest.  12/27/20: Patient was seen and examined with her niece at her bedside.  She denies having any chest pain shortness of breath or palpitations at the time of this visit.  States she feels better today.  Seen by cardiology, will stop heparin drip for now and plan for coronary CTA in the morning.  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 Troponin negative, no evidence of acute ischemia on 12 EKG Seen by cardiology, IV heparin stopped Plan for coronary CTA in the morning. LDL 109, start Lipitor 20 mg daily. At the time of this visit she denies any anginal symptoms. Continue to monitor on telemetry.  Hyperlipidemia LDL 109 Lipitor started 20 mg daily.  Stage IIIb adenocarcinoma of the sigmoid colon She will need to follow-up with her medical oncologist outpatient. Niece states patient finished chemotherapy in January this year and is currently on observation.  Type 2 diabetes, well controlled Hemoglobin A1c 6.9 on 12/27/2020 Hold off oral hypoglycemics. Continue insulin sliding scale.  Essential hypertension BP stable Continue home p.o. Lopressor 25 mg twice daily. Continue to monitor vital signs.  Hypothyroidism Continue home levothyroxine.  GERD Continue home PPI.   Code Status: Full code.  Family Communication: Niece at bedside.  Disposition Plan: Likely will discharge to home on 12/28/2020 or when cardiology signs off.   Consultants:  Cardiology.  Procedures:  None.  Antimicrobials:  None.  DVT prophylaxis: Subcu enoxaparin  Status is: Observation    Dispo:  Patient From: Home  Planned Disposition: Home  Medically stable for discharge: No       Objective: Vitals:   12/26/20 2233 12/26/20 2237 12/27/20 0907 12/27/20 1232  BP:   (!) 144/66 134/69  Pulse:   83 74  Resp: 16  15 16   Temp: 98.6 F (37 C)  98.2 F (36.8 C) 98.6 F (37 C)  TempSrc: Oral  Oral Oral  SpO2:   97%  95%  Weight:  78.3 kg    Height:  5\' 1"  (1.549 m)      Intake/Output Summary (Last 24 hours) at 12/27/2020 1447 Last data filed at 12/27/2020 1100 Gross per 24 hour  Intake 1188 ml  Output 700 ml  Net 488 ml   Filed Weights   12/26/20 2237  Weight: 78.3 kg    Exam:  . General: 75 y.o. year-old female well developed well nourished in no acute  distress.  Alert and oriented x3. . Cardiovascular: Regular rate and rhythm with no rubs or gallops.  No thyromegaly or JVD noted.   Marland Kitchen Respiratory: Clear to auscultation with no wheezes or rales. Good inspiratory effort. . Abdomen: Soft nontender nondistended with normal bowel sounds x4 quadrants. . Musculoskeletal: No lower extremity edema. 2/4 pulses in all 4 extremities. . Skin: No ulcerative lesions noted or rashes . Psychiatry: Mood is appropriate for condition and setting   Data Reviewed: CBC: Recent Labs  Lab 12/26/20 1755  WBC 9.0  NEUTROABS 4.3  HGB 12.8  HCT 39.4  MCV 98.7  PLT 419   Basic Metabolic Panel: Recent Labs  Lab 12/26/20 1755  NA 138  K 3.7  CL 104  CO2 23  GLUCOSE 83  BUN 10  CREATININE 0.55  CALCIUM 8.6*   GFR: Estimated Creatinine Clearance: 56.7 mL/min (by C-G formula based on SCr of 0.55 mg/dL). Liver Function Tests: Recent Labs  Lab 12/26/20 1755  AST 31  ALT 23  ALKPHOS 40  BILITOT 0.3  PROT 7.6  ALBUMIN 4.0   No results for input(s): LIPASE, AMYLASE in the last 168 hours. No results for input(s): AMMONIA in the last 168 hours. Coagulation Profile: No results for input(s): INR, PROTIME in the last 168 hours. Cardiac Enzymes: No results for input(s): CKTOTAL, CKMB, CKMBINDEX, TROPONINI in the last 168 hours. BNP (last 3 results) No results for input(s): PROBNP in the last 8760 hours. HbA1C: Recent Labs    12/27/20 0130  HGBA1C 6.9*   CBG: Recent Labs  Lab 12/27/20 0109 12/27/20 0836 12/27/20 1137  GLUCAP 88 147* 120*   Lipid Profile: Recent Labs    12/27/20 1020  CHOL 207*  HDL 50  LDLCALC 109*  TRIG 240*  CHOLHDL 4.1   Thyroid Function Tests: No results for input(s): TSH, T4TOTAL, FREET4, T3FREE, THYROIDAB in the last 72 hours. Anemia Panel: No results for input(s): VITAMINB12, FOLATE, FERRITIN, TIBC, IRON, RETICCTPCT in the last 72 hours. Urine analysis:    Component Value Date/Time   COLORURINE YELLOW  08/29/2020 1001   APPEARANCEUR CLEAR 08/29/2020 1001   LABSPEC 1.020 08/29/2020 1001   PHURINE 5.5 08/29/2020 1001   GLUCOSEU 100 (A) 08/29/2020 1001   HGBUR NEGATIVE 08/29/2020 1001   BILIRUBINUR NEGATIVE 08/29/2020 1001   BILIRUBINUR neg 05/16/2019 1446   KETONESUR NEGATIVE 08/29/2020 1001   PROTEINUR NEGATIVE 08/29/2020 1001   UROBILINOGEN 0.2 05/16/2019 1446   UROBILINOGEN 0.2 11/09/2017 1559   NITRITE NEGATIVE 08/29/2020 1001   LEUKOCYTESUR NEGATIVE 08/29/2020 1001   Sepsis Labs: @LABRCNTIP (procalcitonin:4,lacticidven:4)  ) Recent Results (from the past 240 hour(s))  Resp Panel by RT-PCR (Flu A&B, Covid) Nasopharyngeal Swab     Status: None   Collection Time: 12/26/20  6:28 PM   Specimen: Nasopharyngeal Swab; Nasopharyngeal(NP) swabs in vial transport medium  Result Value Ref Range Status   SARS Coronavirus 2 by RT PCR NEGATIVE NEGATIVE Final    Comment: (NOTE) SARS-CoV-2 target nucleic acids are NOT DETECTED.  The SARS-CoV-2 RNA is  generally detectable in upper respiratory specimens during the acute phase of infection. The lowest concentration of SARS-CoV-2 viral copies this assay can detect is 138 copies/mL. A negative result does not preclude SARS-Cov-2 infection and should not be used as the sole basis for treatment or other patient management decisions. A negative result may occur with  improper specimen collection/handling, submission of specimen other than nasopharyngeal swab, presence of viral mutation(s) within the areas targeted by this assay, and inadequate number of viral copies(<138 copies/mL). A negative result must be combined with clinical observations, patient history, and epidemiological information. The expected result is Negative.  Fact Sheet for Patients:  EntrepreneurPulse.com.au  Fact Sheet for Healthcare Providers:  IncredibleEmployment.be  This test is no t yet approved or cleared by the [place] FDA  and  has been authorized for detection and/or diagnosis of SARS-CoV-2 by FDA under an Emergency Use Authorization (EUA). This EUA will remain  in effect (meaning this test can be used) for the duration of the COVID-19 declaration under Section 564(b)(1) of the Act, 21 U.S.C.section 360bbb-3(b)(1), unless the authorization is terminated  or revoked sooner.       Influenza A by PCR NEGATIVE NEGATIVE Final   Influenza B by PCR NEGATIVE NEGATIVE Final    Comment: (NOTE) The Xpert Xpress SARS-CoV-2/FLU/RSV plus assay is intended as an aid in the diagnosis of influenza from Nasopharyngeal swab specimens and should not be used as a sole basis for treatment. Nasal washings and aspirates are unacceptable for Xpert Xpress SARS-CoV-2/FLU/RSV testing.  Fact Sheet for Patients: EntrepreneurPulse.com.au  Fact Sheet for Healthcare Providers: IncredibleEmployment.be  This test is not yet approved or cleared by the [place] FDA and has been authorized for detection and/or diagnosis of SARS-CoV-2 by FDA under an Emergency Use Authorization (EUA). This EUA will remain in effect (meaning this test can be used) for the duration of the COVID-19 declaration under Section 564(b)(1) of the Act, 21 U.S.C. section 360bbb-3(b)(1), unless the authorization is terminated or revoked.  Performed at [facility], [address] [place], [place] [address]       Studies: DG Chest 1 View  Result Date: 12/26/2020 CLINICAL DATA:  Shortness of breath EXAM: CHEST  1 VIEW COMPARISON:  04/19/2019 FINDINGS: Right Port-A-Cath in place with the tip at the cavoatrial junction. No pneumothorax. Heart and mediastinal contours are within normal limits. No focal opacities or effusions. No acute bony abnormality. IMPRESSION: No active disease. Electronically Signed   By: Rolm Baptise M.D.   On: 12/26/2020 18:46    Scheduled Meds: . insulin aspart  0-5 Units Subcutaneous  QHS  . insulin aspart  0-9 Units Subcutaneous TID WC  . [START ON 12/28/2020] levothyroxine  75 mcg Oral QAC breakfast  . [START ON 12/28/2020] metoprolol tartrate  100 mg Oral Once  . metoprolol tartrate  25 mg Oral BID  . pantoprazole  40 mg Oral Daily  . [START ON 12/28/2020] prednisoLONE acetate  1 drop Both Eyes Once per day on Mon Wed Fri    Continuous Infusions:   LOS: 0 days     Kayleen Memos, MD [facility] Pager [phone]  If 7PM-7AM, please contact night-coverage [email or web address] Password [facility] 12/27/2020, 2:47 PM

## 2020-12-27 NOTE — Progress Notes (Signed)
ANTICOAGULATION CONSULT NOTE - Initial Consult  Pharmacy Consult for Heparin  Indication: chest pain/ACS  Allergies  Allergen Reactions  . Nitroglycerin Nausea And Vomiting  . Ampicillin Other (See Comments)    Per allergy test    Patient Measurements: Height: 5\' 1"  (154.9 cm) Weight: 78.3 kg (172 lb 9.6 oz) IBW/kg (Calculated) : 47.8  Vital Signs: Temp: 98.6 F (37 C) (04/06 2233) Temp Source: Oral (04/06 2233) BP: 142/67 (04/06 1845) Pulse Rate: 77 (04/06 1845)  Labs: Recent Labs    12/26/20 1755 12/26/20 2240 12/27/20 0130  HGB 12.8  --   --   HCT 39.4  --   --   PLT 210  --   --   CREATININE 0.55  --   --   TROPONINIHS 4 5 6     Estimated Creatinine Clearance: 56.7 mL/min (by C-G formula based on SCr of 0.55 mg/dL).   Medical History: Past Medical History:  Diagnosis Date  . 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 Diabetes mellitus without complication ([place])   . Goals of care, counseling/discussion 04/26/2020  . Hypertension   . SVT (supraventricular tachycardia) ([place])   . Thyroid disease      Assessment: 75 y/o F to start heparin for chest pain. Troponin is negative. CBC/renal function good. PTA meds reviewed.   Goal of Therapy:  Heparin level 0.3-0.7 units/ml Monitor platelets by anticoagulation protocol: Yes   Plan:  Heparin 4000 units BOLUS Start heparin drip at 800 units/hr 1100 Heparin level Daily CBC/Heparin level Monitor for bleeding  Narda Bonds, PharmD, BCPS Clinical Pharmacist Phone: [phone]

## 2020-12-27 NOTE — Consult Note (Addendum)
Cardiology Consultation:   Patient ID: Bailey Walker MRN: [id number]; DOB: 1946/02/16  Admit date: 12/26/2020 Date of Consult: 12/27/2020  PCP:  [name], [facility]  Cardiologist:  Quay Burow, MD 07/12/2019 Advanced Practice Provider:  No care team member to display Electrophysiologist:  None   Patient Profile:   Bailey Walker is a 75 y.o. 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  She was evaluated by Dr Gwenlyn Found in 2020 for chest pain, referred from Dr Annie Main to consider cardiac cath. Her echo was normal, sx atypical and Dr Gwenlyn Found recommended a cardiac CT, never done.  History of Present Illness:   Bailey Walker speaks Aramaic. Her niece is here and acts as a Optometrist.  The chest pain now is the same pain she was having in 2020. It happens every 2-3 weeks.  The pain is a pressure. It is associated w/ SOB, no N&V or diaphoresis. 10/10 at its worst.  She came to the hospital yesterday because of the severity of the episode. In the ER, she was given ASA 324 mg, that seemed to help the pain. It resolved after the ASA, has not returned.  The pain is not changed by deep inspiration. The pain started early afternoon yesterday, started at rest.   When she walks, she will get SOB, and also get some pressure. Using an inhaler makes it better. She no longer has an inhaler, so could not try that yesterday.   No recent wheezing. No respiratory issues now except DOE.   Past Medical History:  Diagnosis Date  . 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 ([place]) 04/26/2020  . Diabetes mellitus without complication ([place])   . Goals of care, counseling/discussion 04/26/2020  . Hypertension   . SVT (supraventricular tachycardia) ([place])   .  Thyroid disease     Past Surgical History:  Procedure Laterality Date  . APPENDECTOMY    . BIOPSY  04/18/2020   Procedure: BIOPSY;  Surgeon: Lavena Bullion, DO;  Location: [facility];  Service: Gastroenterology;;  . BIOPSY  04/19/2020   Procedure: BIOPSY;  Surgeon: Lavena Bullion, DO;  Location: [place];  Service: Gastroenterology;;  . BREAST SURGERY     Fatty tissue on biopsy  . ESOPHAGOGASTRODUODENOSCOPY (EGD) WITH PROPOFOL N/A 04/18/2020   Procedure: ESOPHAGOGASTRODUODENOSCOPY (EGD) WITH PROPOFOL;  Surgeon: Lavena Bullion, DO;  Location: [place];  Service: Gastroenterology;  Laterality: N/A;  . EYE SURGERY Bilateral    april and march 2019 for cataracts.   . fatty gland     left wrist , right breast  . FLEXIBLE SIGMOIDOSCOPY N/A 04/18/2020   Procedure: FLEXIBLE SIGMOIDOSCOPY;  Surgeon: Lavena Bullion, DO;  Location: [place];  Service: Gastroenterology;  Laterality: N/A;  . FLEXIBLE SIGMOIDOSCOPY N/A 04/19/2020   Procedure: FLEXIBLE SIGMOIDOSCOPY;  Surgeon: Lavena Bullion, DO;  Location: [place];  Service: Gastroenterology;  Laterality: N/A;  . INCISION AND DRAINAGE OF WOUND N/A 05/01/2020   Procedure: ABDOMINAL  WOUND EXPLORATION; IRRIGATION AND DEBRIDEMENT WOUND;  Surgeon: Rolm Bookbinder, MD;  Location: [place];  Service: General;  Laterality: N/A;  . IR IMAGING GUIDED PORT INSERTION  06/13/2020  . KNEE SURGERY     left knee  . LAPAROTOMY N/A 04/20/2020  Procedure: SIGMOID COLECTOMY AND COLOSTOMY;  Surgeon: Coralie Keens, MD;  Location: [place];  Service: General;  Laterality: N/A;  . SUBMUCOSAL TATTOO INJECTION  04/19/2020   Procedure: SUBMUCOSAL TATTOO INJECTION;  Surgeon: Lavena Bullion, DO;  Location: [facility];  Service: Gastroenterology;;     Home Medications:  Prior to Admission medications   Medication Sig Start Date End Date Taking? Authorizing Provider  alendronate (FOSAMAX) 70 MG tablet Take 70 mg by mouth every Sunday.   Patient not taking: Reported on 11/15/2020 03/01/20   [provider]  aspirin EC 81 MG tablet Take 81 mg by mouth daily. Swallow whole.    [provider]  denosumab (PROLIA) 60 MG/ML SOSY injection Inject 60 mg into the skin every 6 (six) months.  Patient not taking: Reported on 11/15/2020    [provider]  dexamethasone (DECADRON) 4 MG tablet Take 2 tablets (8 mg total) by mouth daily. Start the day after chemotherapy for 2 days. Take with food. Patient not taking: Reported on 11/15/2020 10/16/20   Volanda Napoleon, MD  DEXILANT 30 MG capsule TAKE 1 CAPSULE BY MOUTH EVERY DAY Patient taking differently: Take 30 mg by mouth daily. 10/20/19   Tresa Garter, MD  docusate sodium (COLACE) 100 MG capsule Take 1 capsule (100 mg total) by mouth 2 (two) times daily. 05/02/20   Raiford Noble Latif, DO  feeding supplement, ENSURE ENLIVE, (ENSURE ENLIVE) LIQD Take 237 mLs by mouth 3 (three) times daily between meals. 05/02/20   Raiford Noble Latif, DO  gabapentin (NEURONTIN) 100 MG capsule Take 100 mg by mouth 3 (three) times daily.  Patient not taking: Reported on 11/15/2020 07/12/20   [provider]  glipiZIDE (GLUCOTROL) 5 MG tablet TAKE 1 TABLET BY MOUTH TWICE A DAY BEFORE MEALS Patient taking differently: Take 5 mg by mouth See admin instructions. Take one tablet (5 mg) by mouth twice daily - after lunch and at midnight 11/04/19   Dorena Dew, FNP  levothyroxine (SYNTHROID) 75 MCG tablet TAKE 1 TABLET BY MOUTH EVERY DAY Patient taking differently: Take 75 mcg by mouth daily before breakfast. 01/02/20   Dorena Dew, FNP  lidocaine-prilocaine (EMLA) cream Apply to affected area once 06/11/20   Volanda Napoleon, MD  metFORMIN (GLUCOPHAGE) 500 MG tablet Take 0.5 tablets (250 mg total) by mouth 2 (two) times daily with a meal. Patient taking differently: Take 500 mg by mouth See admin instructions. Take one tablet (500 mg) by mouth twice daily - after lunch  and at midnight 05/16/19   [name], FNP  methocarbamol (ROBAXIN) 500 MG tablet TAKE 1 TABLET BY MOUTH EVERY 12 HOURS AS NEEDED FOR MUSCLE SPASMS. 11/20/20   Volanda Napoleon, MD  metoprolol tartrate (LOPRESSOR) 25 MG tablet Take 1 tablet (25 mg total) by mouth 2 (two) times daily. 08/02/19   Tresa Garter, MD  Multiple Vitamin (MULTIVITAMIN WITH MINERALS) TABS tablet Take 1 tablet by mouth daily. 05/02/20   Raiford Noble Latif, DO  mupirocin ointment (BACTROBAN) 2 % Apply 1 application topically 2 (two) times daily. Patient not taking: Reported on 11/15/2020 08/15/20   Volanda Napoleon, MD  nitroGLYCERIN (NITROSTAT) 0.4 MG SL tablet Place 1 tablet (0.4 mg total) under the tongue every 5 (five) minutes as needed for chest pain. 05/16/19 04/16/20  [name], [id number]    [provider]  nystatin (MYCOSTATIN/NYSTOP) powder Apply topically. 07/11/20   [provider]  ondansetron [facility])  4 MG tablet Take 1 tablet (4 mg total) by mouth every 6 (six) hours as needed for nausea. Patient not taking: Reported on 11/15/2020 05/02/20   Raiford Noble Latif, DO  ondansetron (ZOFRAN) 8 MG tablet Take 1 tablet (8 mg total) by mouth 2 (two) times daily as needed for refractory nausea / vomiting. Start on day 3 after chemotherapy. Patient not taking: Reported on 11/15/2020 06/11/20   Volanda Napoleon, MD  prednisoLONE acetate (PRED FORTE) 1 % ophthalmic suspension Place 1 drop into both eyes See admin instructions. Instill one drop into both eyes on Monday, Wednesday, Friday nights    [provider]  prochlorperazine (COMPAZINE) 10 MG tablet Take 1 tablet (10 mg total) by mouth every 6 (six) hours as needed (Nausea or vomiting). Patient not taking: Reported on 11/15/2020 06/11/20   Volanda Napoleon, MD  traMADol (ULTRAM) 50 MG tablet Take 50 mg by mouth every 6 (six) hours as needed. 07/10/20   [provider]    Inpatient  Medications: Scheduled Meds: . insulin aspart  0-5 Units Subcutaneous QHS  . insulin aspart  0-9 Units Subcutaneous TID WC  . [START ON 12/28/2020] levothyroxine  75 mcg Oral QAC breakfast  . metoprolol tartrate  25 mg Oral BID  . pantoprazole  40 mg Oral Daily  . [START ON 12/28/2020] prednisoLONE acetate  1 drop Both Eyes Once per day on Mon Wed Fri   Continuous Infusions: . heparin 1,000 Units/hr (12/27/20 1205)   PRN Meds: acetaminophen, nitroGLYCERIN, ondansetron (ZOFRAN) IV  Allergies:    Allergies  Allergen Reactions  . Nitroglycerin Nausea And Vomiting  . Ampicillin Other (See Comments)    Per allergy test    Social History:   Social History   Socioeconomic History  . Marital status: Single    Spouse name: Not on file  . Number of children: 0  . Years of education: Not on file  . Highest education level: Not on file  Occupational History  . Occupation: retired  Tobacco Use  . Smoking status: Former Smoker    Quit date: 12/29/2013    Years since quitting: 7.0  . Smokeless tobacco: Never Used  Vaping Use  . Vaping Use: Never used  Substance and Sexual Activity  . Alcohol use: No  . Drug use: No  . Sexual activity: Not on file  Other Topics Concern  . Not on file  Social History Narrative  . Not on file   Social Determinants of Health   Financial Resource Strain: Not on file  Food Insecurity: Not on file  Transportation Needs: Not on file  Physical Activity: Not on file  Stress: Not on file  Social Connections: Not on file  Intimate Partner Violence: Not on file    Family History:   Family History  Problem Relation Age of Onset  . Breast cancer Cousin        mat and pat sides  . Cancer Sister        cancer everywhere  . Heart disease Brother   . Colon cancer Neg Hx   . Esophageal cancer Neg Hx   . Rectal cancer Neg Hx    Family Status  Relation Name Status  . Cousin  Deceased  . Sister  Deceased  . Mother  Deceased  . Father  Deceased  .  Brother  Alive  . Neg Hx  (Not Specified)    ROS:  Please see the history of present illness.  All other ROS  reviewed and negative.     Physical Exam/Data:   Vitals:   12/26/20 2233 12/26/20 2237 12/27/20 0907 12/27/20 1232  BP:   (!) 144/66 134/69  Pulse:   83 74  Resp: 16  15 16   Temp: 98.6 F (37 C)  98.2 F (36.8 C) 98.6 F (37 C)  TempSrc: Oral  Oral Oral  SpO2:   97% 95%  Weight:  78.3 kg    Height:  5\' 1"  (1.549 m)      Intake/Output Summary (Last 24 hours) at 12/27/2020 1416 Last data filed at 12/27/2020 1100 Gross per 24 hour  Intake 1188 ml  Output 700 ml  Net 488 ml   Last 3 Weights 12/26/2020 11/15/2020 10/22/2020  Weight (lbs) 172 lb 9.6 oz 170 lb 165 lb  Weight (kg) 78.291 kg 77.111 kg 74.844 kg     Body mass index is 32.61 kg/m.  General:  Well nourished, well developed, in no acute distress HEENT: normal Lymph: no adenopathy Neck: no JVD seen Endocrine:  No thryomegaly Vascular: No carotid bruits; 4/4 extremity pulses 2+  Cardiac:  normal S1, S2; RRR; no murmur  Lungs:  clear to auscultation bilaterally, no wheezing, rhonchi or rales, +chest wall tenderness, but that does not reproduce her admitting sx Abd: soft, nontender, no hepatomegaly  Ext: no edema Musculoskeletal:  No deformities, BUE and BLE strength normal and equal Skin: warm and dry  Neuro:  CNs 2-12 intact, no focal abnormalities noted Psych:  Normal affect   EKG:  The EKG was personally reviewed and demonstrates:  04/06 ECG is SR, HR 84, no acute change Telemetry:  Telemetry was personally reviewed and demonstrates:  SR  Relevant CV Studies:  ECHO: 12/27/2020 PENDING  ECHO: 07/08/2019 [facility]  EF 60-65%, mild concentric LVH, other chambers normal in size and function Normal thoracic aorta and aortic arch, no peric effusion, normal valve anatomy  Laboratory Data:  High Sensitivity Troponin:   Recent Labs  Lab 12/26/20 1755 12/26/20 2240 12/27/20 0130 12/27/20 0619  12/27/20 1020  TROPONINIHS 4 5 6 7 6      Chemistry Recent Labs  Lab 12/26/20 1755  NA 138  K 3.7  CL 104  CO2 23  GLUCOSE 83  BUN 10  CREATININE 0.55  CALCIUM 8.6*  GFRNONAA >60  ANIONGAP 11    Recent Labs  Lab 12/26/20 1755  PROT 7.6  ALBUMIN 4.0  AST 31  ALT 23  ALKPHOS 40  BILITOT 0.3   Hematology Recent Labs  Lab 12/26/20 1755  WBC 9.0  RBC 3.99  HGB 12.8  HCT 39.4  MCV 98.7  MCH 32.1  MCHC 32.5  RDW 12.5  PLT 210   BNP Recent Labs  Lab 12/26/20 1755  BNP 116.7*    DDimer  Recent Labs  Lab 12/26/20 1755  DDIMER <0.27     Radiology/Studies:  DG Chest 1 View  Result Date: 12/26/2020 CLINICAL DATA:  Shortness of breath EXAM: CHEST  1 VIEW COMPARISON:  04/19/2019 FINDINGS: Right Port-A-Cath in place with the tip at the cavoatrial junction. No pneumothorax. Heart and mediastinal contours are within normal limits. No focal opacities or effusions. No acute bony abnormality. IMPRESSION: No active disease. Electronically Signed   By: Rolm Baptise M.D.   On: 12/26/2020 18:46     Assessment and Plan:   1. Chest pain - ez net MI, ECG not acute,  NO afib - w/ mult CRFs, definitive test indicated - will get cardiac CT in  am   2. HLD - LDL now 107 - w/ DM, would benefit from statin - w/ some calcium on coronary vessels on CT, goal LDL should be 70 - will start Lipitor 20 mg qd   Risk Assessment/Risk Scores:     HEAR Score (for undifferentiated chest pain):             For questions or updates, please contact [place] Please consult [email or web address] for contact info under    Signed, Rosaria Ferries, PA-C  12/27/2020 2:16 PM

## 2020-12-27 NOTE — Plan of Care (Signed)

## 2020-12-28 ENCOUNTER — Observation Stay (HOSPITAL_COMMUNITY): Payer: Medicaid Other

## 2020-12-28 DIAGNOSIS — I700 Atherosclerosis of aorta: Secondary | ICD-10-CM | POA: Diagnosis not present

## 2020-12-28 DIAGNOSIS — R079 Chest pain, unspecified: Secondary | ICD-10-CM | POA: Diagnosis not present

## 2020-12-28 LAB — BASIC METABOLIC PANEL
Anion gap: 9 (ref 5–15)
BUN: 11 mg/dL (ref 8–23)
CO2: 23 mmol/L (ref 22–32)
Calcium: 8.2 mg/dL — ABNORMAL LOW (ref 8.9–10.3)
Chloride: 106 mmol/L (ref 98–111)
Creatinine, Ser: 0.6 mg/dL (ref 0.44–1.00)
GFR, Estimated: >60 mL/min (ref >60)
Glucose, Bld: 119 mg/dL — ABNORMAL HIGH (ref 70–99)
Potassium: 3.8 mmol/L (ref 3.5–5.1)
Sodium: 138 mmol/L (ref 135–145)

## 2020-12-28 LAB — MAGNESIUM: Magnesium: 2.1 mg/dL (ref 1.7–2.4)

## 2020-12-28 LAB — PHOSPHORUS: Phosphorus: 2.9 mg/dL (ref 2.5–4.6)

## 2020-12-28 LAB — GLUCOSE, CAPILLARY
Glucose-Capillary: 124 mg/dL — ABNORMAL HIGH (ref 70–99)
Glucose-Capillary: 124 mg/dL — ABNORMAL HIGH (ref 70–99)

## 2020-12-28 MED ORDER — IOHEXOL 350 MG/ML SOLN
100.0000 mL | Freq: Once | INTRAVENOUS | Status: AC | PRN
  Administered 2020-12-28: 100 mL via INTRAVENOUS

## 2020-12-28 MED ORDER — NITROGLYCERIN 0.4 MG SL SUBL
SUBLINGUAL_TABLET | SUBLINGUAL | Status: AC
  Filled 2020-12-28: qty 2

## 2020-12-28 MED ORDER — METOPROLOL TARTRATE 50 MG PO TABS
50.0000 mg | ORAL_TABLET | Freq: Once | ORAL | Status: DC
  Filled 2020-12-28: qty 1

## 2020-12-28 MED ORDER — IVABRADINE HCL 5 MG PO TABS
10.0000 mg | ORAL_TABLET | Freq: Once | ORAL | Status: AC
  Administered 2020-12-28: 10 mg via ORAL
  Filled 2020-12-28: qty 2

## 2020-12-28 MED ORDER — HEPARIN SOD (PORK) LOCK FLUSH 100 UNIT/ML IV SOLN
500.0000 [IU] | INTRAVENOUS | Status: AC | PRN
  Administered 2020-12-28: 500 [IU]

## 2020-12-28 MED ORDER — ATORVASTATIN CALCIUM 10 MG PO TABS: 20.0000 mg | ORAL_TABLET | Freq: Every day | ORAL | 0 refills | Status: DC

## 2020-12-28 NOTE — Discharge Summary (Signed)
Discharge Summary  Bailey Walker [facility]:[id number] DOB: 1945-12-23  PCP: Bailey Cowman, MD  Admit date: 12/26/2020 Discharge date: 12/28/2020  Time spent: 35 minutes.  Recommendations for Outpatient Follow-up:  1. Follow-up with cardiology in 1 to 2 weeks. 2. Follow-up with your medical oncologist in 1 to 2 weeks. 3. Follow-up with your primary care provider in 1 to 2 weeks. 4. Take your medications as prescribed.  Discharge Diagnoses:  Active Hospital Problems   Diagnosis Date Noted  . Chest pain 12/26/2020  . 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 Diabetes mellitus due to underlying condition with unspecified complications ([place]) 66/29/4765  . Essential hypertension 01/11/2017  . Hypothyroidism 01/08/2017    Resolved Hospital Problems  No resolved problems to display.    Discharge Condition: Stable.  Diet recommendation: Resume previous diet.  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 High-sensitivity troponin negative x2. EKG without acute ischemic changes. D-dimer negative. No significant elevation of BNP. Covid and influenza PCR negative. Chest x-ray showing no active disease. Patient was given aspirin 324 mg.   Patient speaks Aramaic.Language interpretation done by her niece at bedside (per patient preference). Niece states she was at work yesterday and in the afternoon around 3:30 PM patient called her to let her know that she was having chest pressure, dizziness, and shortness of  breath. Niece then went home and took her to the emergency room to be evaluated. States she has been complaining of chest pain on and off since 2013 when she quit smoking. She was seen by her cardiologist a few years ago and told that her heart was normal. She usually experiences chest pain with exertion but yesterday it was at rest.  12/28/20:  Patient was seen with her niece at her bedside.  She has no new complaints.  She denies having any chest pain, dyspnea, or palpitations at the time of this visit.   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 Troponin negative, no evidence of acute ischemia on 12 lead EKG Seen by cardiology, IV heparin stopped on 12/27/2020 Coronary CTA done on 12/28/2020 revealed per cardiology Dr. Audie Walker: Mild coronary artery disease, with recommendation for aspirin and statin. LDL 109,  continue Lipitor 20 mg daily, started in the hospital. Denies any anginal symptoms at the time of this visit. Follow-up with cardiology, keep appointment.  Hyperlipidemia LDL 109 Continue Lipitor 20 mg daily.  Stage IIIb adenocarcinoma of the sigmoid colon She will need to follow-up with her medical oncologist outpatient. [place] patient finished chemotherapy in January this year and is currently on observation. Follow-up with medical oncology in 1 to 2 weeks.  Type 2 diabetes, well controlled Hemoglobin A1c 6.9 on 12/27/2020 Resume home regimen.  Essential hypertension BP stable Continue home  p.o. Lopressor 25 mg twice daily.  Hypothyroidism Continue home levothyroxine.  GERD Mild wall thickening on the visualized distal esophagus  suspicious for esophagitis seen on CTA coronaries done on 12/28/2020. Continue home PPI. Follow-up with your PCP in 1 to 2 weeks.   Code Status: Full code.    Consultants:  Cardiology.  Procedures:  None.  Antimicrobials:  None.   Discharge Exam: BP 112/61 (BP Location: Left Arm)   Pulse 64   Temp 98.5 F (36.9 C) (Oral)   Resp 16   Ht 5\' 1"  (1.549 m)   Wt 78.4 kg   SpO2 98%   BMI 32.67 kg/m  . General: 75 y.o. year-old female well developed well nourished in no acute distress.  Alert and oriented x3. . Cardiovascular: Regular rate and rhythm with no rubs or gallops.  No thyromegaly or JVD noted.   Marland Kitchen Respiratory: Clear to auscultation with no wheezes or rales. Good inspiratory effort. . Abdomen: Soft nontender nondistended with normal bowel sounds x4 quadrants. . Musculoskeletal: No lower extremity edema.  . Skin: No ulcerative lesions noted or rashes. . Psychiatry: Mood is appropriate for condition and setting  Discharge Instructions You were cared for by a hospitalist during your hospital stay. If you have any questions about your discharge medications or the care you received while you were in the hospital after you are discharged, you can call the unit and asked to speak with the hospitalist on call if the hospitalist that took care of you is not available. Once you are discharged, your primary care physician will handle any further medical issues. Please note that NO REFILLS for any discharge medications will be authorized once you are discharged, as it is imperative that you return to your primary care physician (or establish a relationship with a primary care physician if you do not have one) for your aftercare needs so that they can reassess your need for medications and monitor your lab values.   Allergies as of 12/28/2020      Reactions   Nitroglycerin Nausea And Vomiting   Ampicillin Other (See Comments)   Per allergy test      Medication List     STOP taking these medications   alendronate 70 MG tablet Commonly known as: FOSAMAX   dexamethasone 4 MG tablet Commonly known as: DECADRON   gabapentin 100 MG capsule Commonly known as: NEURONTIN   mupirocin ointment 2 % Commonly known as: BACTROBAN   ondansetron 4 MG tablet Commonly known as: ZOFRAN   ondansetron 8 MG tablet Commonly known as: Zofran   prochlorperazine 10 MG tablet Commonly known as: COMPAZINE     TAKE these medications   aspirin EC 81 MG tablet Take 81 mg by mouth daily. Swallow whole.   atorvastatin 10 MG tablet Commonly known as: LIPITOR Take 2 tablets (20 mg total) by mouth daily.   denosumab 60 MG/ML Sosy injection Commonly known as: PROLIA Inject 60 mg into the skin every 6 (six) months.   Dexilant 30 MG capsule Generic drug: Dexlansoprazole TAKE 1 CAPSULE BY MOUTH EVERY DAY What changed: how much to take   docusate sodium 100 MG capsule Commonly known as: COLACE Take 1 capsule (100 mg total) by mouth 2 (two) times daily.   feeding supplement Liqd Take 237 mLs by mouth 3 (three) times daily between meals.   glipiZIDE 5 MG tablet Commonly known as: GLUCOTROL TAKE 1 TABLET BY MOUTH TWICE A DAY BEFORE MEALS What changed: See the new instructions.  levothyroxine 75 MCG tablet Commonly known as: SYNTHROID TAKE 1 TABLET BY MOUTH EVERY DAY What changed: when to take this   lidocaine-prilocaine cream Commonly known as: EMLA Apply to affected area once   metFORMIN 500 MG tablet Commonly known as: GLUCOPHAGE Take 0.5 tablets (250 mg total) by mouth 2 (two) times daily with a meal. What changed:   how much to take  when to take this  additional instructions   methocarbamol 500 MG tablet Commonly known as: ROBAXIN TAKE 1 TABLET BY MOUTH EVERY 12 HOURS AS NEEDED FOR MUSCLE SPASMS.   metoprolol tartrate 25 MG tablet Commonly known as: LOPRESSOR Take 1 tablet (25 mg total) by mouth 2 (two) times daily.   multivitamin with  minerals Tabs tablet Take 1 tablet by mouth daily.   nitroGLYCERIN 0.4 MG SL tablet Commonly known as: NITROSTAT Place 1 tablet (0.4 mg total) under the tongue every 5 (five) minutes as needed for chest pain.   NON [facility] Creams-#11   nystatin powder Commonly known as: MYCOSTATIN/NYSTOP Apply topically.   prednisoLONE acetate 1 % ophthalmic suspension Commonly known as: PRED FORTE Place 1 drop into both eyes See admin instructions. Instill one drop into both eyes on Monday, Wednesday, Friday nights   traMADol 50 MG tablet Commonly known as: ULTRAM Take 50 mg by mouth every 6 (six) hours as needed.      Allergies  Allergen Reactions  . Nitroglycerin Nausea And Vomiting  . Ampicillin Other (See Comments)    Per allergy test    Follow-up Information    Bailey Cowman, MD. Call in 1 day(s).   Specialty: Family Medicine Why: Please call for a post hospital follow-up appointment. Contact information: [place] [address] [phone]        Lorretta Harp, MD .   Specialties: Cardiology, Radiology Contact information: [address] [place] [address] [phone]                The results of significant diagnostics from this hospitalization (including imaging, microbiology, ancillary and laboratory) are listed below for reference.    Significant Diagnostic Studies: DG Chest 1 View  Result Date: 12/26/2020 CLINICAL DATA:  Shortness of breath EXAM: CHEST  1 VIEW COMPARISON:  04/19/2019 FINDINGS: Right Port-A-Cath in place with the tip at the cavoatrial junction. No pneumothorax. Heart and mediastinal contours are within normal limits. No focal opacities or effusions. No acute bony abnormality. IMPRESSION: No active disease. Electronically Signed   By: Rolm Baptise M.D.   On: 12/26/2020 18:46   CT CORONARY MORPH W/CTA COR W/SCORE W/CA W/CM &/OR WO/CM  Addendum Date: 12/28/2020   ADDENDUM REPORT:  12/28/2020 15:01 EXAM: OVER-READ INTERPRETATION  CT CHEST The following report is an over-read performed by radiologist Dr. Eben Burow [facility] on 12/28/2020. This over-read does not include interpretation of cardiac or coronary anatomy or pathology. The coronary calcium score/coronary CTA interpretation by the cardiologist is attached. COMPARISON:  Chest CT on 11/13/2020 FINDINGS: The visualized portions of the lower lung fields show no suspicious nodules, masses, or infiltrates. No pleural fluid seen. Small hiatal hernia is again noted. Mild wall thickening of the visualized distal esophagus is suspicious for esophagitis. IMPRESSION: Small hiatal hernia. Mild wall thickening of the visualized distal esophagus, suspicious for esophagitis. Electronically Signed   By: Marlaine Hind M.D.   On: 12/28/2020 15:01   Result Date: 12/28/2020 CLINICAL DATA:  75 Year old Caucasian Female EXAM: Cardiac/Coronary  CTA TECHNIQUE:  The patient was scanned on a [employer]. FINDINGS: A 100 kV prospective scan was triggered in the descending thoracic aorta at 111 HU's. Axial non-contrast 3 mm slices were carried out through the heart. The data set was analyzed on a dedicated work station and scored using the [place]. Gantry rotation speed was 250 msecs and collimation was .6 mm. No beta blockade and 0.8 mg of sl NTG was given. The 3D data set was reconstructed in 5% intervals of the 67-82 % of the R-R cycle. Diastolic phases were analyzed on a dedicated work station using MPR, MIP and VRT modes. The patient received 100 cc of contrast. Aorta: Normal size. Descending aortic atherosclerosis noted. No dissection. Aortic Valve:  Tri-leaflet.  No calcifications. Coronary Arteries:  Normal coronary origin.  Co-dominance. Coronary Calcium Score: Left main: 13 Left anterior descending artery: 0 Left circumflex artery: 207 Right coronary artery: 57 Total: 277 Percentile: 76th for age, sex, and race matched  control. RCA is a co-dominant artery that gives rise to PDA and PLA. There is a minimal non-obstructive (1-24%) calcified plaque in the proximal vessel. There is a minimal non-obstructive (1-24%) calcified plaque in the mid vessel. There appears to be a breathing/stair step artifact in the mid RCA. Left main is a large artery that gives rise to LAD and LCX arteries. There is a less than 10% small calcification in the mid vessel. LAD is a large vessel that gives rise to one small D1 vessel. There is a minimal non-obstructive (1-24%) soft plaque in the proximal vessel. LCX is a co-dominant artery that gives rise to one large OM1 branch the bifurcates to a large OM2. There is a mild non-obstructive (25-49%) calcified plaque in the proximal vessel. There is a mild non-obstructive (25-49%) calcified plaque in the mid vessel. There is a mild non-obstructive (25-49%) calcified plaque in the distal vessel. 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 Normal left atrial appendage without a thrombus. Pulmonary artery is at the upper limit of normal - 29 mm. Extra-cardiac findings: See attached radiology report for non-cardiac structures. IMPRESSION: 1. Coronary calcium score of 277. This was 76th percentile for age, sex, and race matched control. 2. Normal coronary origin with co-dominance. 3. Descending aortic atherosclerosis noted. 4. CAD-RADS 2. Mild non-obstructive CAD (25-49%). Consider non-atherosclerotic causes of chest pain. Consider preventive therapy and risk factor modification. Additionally there appears to be a breathing/stair step artifact in the mid RCA. 5. 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 6. Pulmonary artery is at the upper limit of normal - 29 mm. RECOMMENDATIONS: Coronary artery calcium (CAC) score is a strong predictor of incident coronary heart disease (CHD) and provides  predictive information beyond traditional risk factors. CAC scoring is reasonable to use in the decision to withhold, postpone, or initiate statin therapy in intermediate-risk or selected borderline-risk asymptomatic adults (age 17-75 years and LDL-C ?70 to <190 mg/dL) who do not have diabetes or established atherosclerotic cardiovascular disease (ASCVD).* In intermediate-risk (10-year ASCVD risk ?7.5% to <20%) adults or selected borderline-risk (10-year ASCVD risk ?5% to <7.5%) adults in whom a CAC score is measured for the purpose of making a treatment decision the following recommendations have been made: If CAC = 0, it is reasonable to withhold statin therapy and reassess in 5 to 10 years, as long as higher risk conditions are absent (diabetes mellitus, family history of premature CHD in  first degree relatives (males <55 years; females <65 years), cigarette smoking, LDL ?190 mg/dL or other independent risk factors). If CAC is 1 to 99, it is reasonable to initiate statin therapy for patients ?77 years of age. If CAC is ?100 or ?75th percentile, it is reasonable to initiate statin therapy at any age. Cardiology referral should be considered for patients with CAC scores ?400 or ?75th percentile. *2018 AHA/ACC/AACVPR/AAPA/ABC/ACPM/ADA/AGS/APhA/ASPC/NLA/PCNA Guideline on the Management of Blood Cholesterol: A Report of the American College of Cardiology/American Heart Association Task Force on Clinical Practice Guidelines. J Am Coll Cardiol. 2019;73(24):3168-3209. Rudean Haskell, MD Electronically Signed: By: Rudean Haskell MD On: 12/28/2020 13:30   ECHOCARDIOGRAM COMPLETE  Result Date: 12/27/2020    ECHOCARDIOGRAM REPORT   Patient Name:   EMERLYN MEHLHOFF [facility] Date of Exam: 12/27/2020 Medical Rec #:  [id number]            Height:       61.0 in Accession #:    [id number]           Weight:       172.6 lb Date of Birth:  [birth year]            BSA:          1.774 m Patient Age:    5 years             BP:            134/69 mmHg Patient Gender: F                    HR:           74 bpm. Exam Location:  Inpatient Procedure: 2D Echo, Cardiac Doppler and Color Doppler Indications:    Chest pain  History:        Patient has prior history of Echocardiogram examinations, most                 recent 09/09/2017. Risk Factors:Hypertension, Diabetes and                 Dyslipidemia. GERD.  Sonographer:    Clayton Lefort RDCS (AE) Referring Phys: [id number] [facility]  Sonographer Comments: Suboptimal subcostal window. IMPRESSIONS  1. Left ventricular ejection fraction, by estimation, is 55 to 60%. The left ventricle has normal function. The left ventricle has no regional wall motion abnormalities. Left ventricular diastolic parameters were normal.  2. Right ventricular systolic function is normal. The right ventricular size is normal.  3. The mitral valve is normal in structure. Trivial mitral valve regurgitation. No evidence of mitral stenosis.  4. The aortic valve is tricuspid. There is moderate calcification of the aortic valve. Aortic valve regurgitation is not visualized. Mild to moderate aortic valve sclerosis/calcification is present, without any evidence of aortic stenosis.  5. The inferior vena cava is normal in size with greater than 50% respiratory variability, suggesting right atrial pressure of 3 mmHg. FINDINGS  Left Ventricle: Left ventricular ejection fraction, by estimation, is 55 to 60%. The left ventricle has normal function. The left ventricle has no regional wall motion abnormalities. The left ventricular internal cavity size was normal in size. There is  no left ventricular hypertrophy. Left ventricular diastolic parameters were normal. Right Ventricle: The right ventricular size is normal. No increase in right ventricular wall thickness. Right ventricular systolic function is normal. Left Atrium: Left atrial size was normal in size. Right Atrium: Right atrial size was normal in size. Pericardium: There is no  evidence of pericardial effusion. Mitral Valve: The mitral valve is normal in structure. Trivial mitral valve regurgitation. No evidence of mitral valve stenosis. MV peak gradient, 2.4 mmHg. The mean mitral valve gradient is 1.0 mmHg. Tricuspid Valve: The tricuspid valve is normal in structure. Tricuspid valve regurgitation is mild . No evidence of tricuspid stenosis. Aortic Valve: The aortic valve is tricuspid. There is moderate calcification of the aortic valve. Aortic valve regurgitation is not visualized. Mild to moderate aortic valve sclerosis/calcification is present, without any evidence of aortic stenosis. Aortic valve mean gradient measures 4.0 mmHg. Aortic valve peak gradient measures 7.2 mmHg. Aortic valve area, by VTI measures 2.01 cm. Pulmonic Valve: The pulmonic valve was normal in structure. Pulmonic valve regurgitation is not visualized. No evidence of pulmonic stenosis. Aorta: The aortic root is normal in size and structure. Venous: The inferior vena cava is normal in size with greater than 50% respiratory variability, suggesting right atrial pressure of 3 mmHg. IAS/Shunts: The interatrial septum was not well visualized.  LEFT VENTRICLE PLAX 2D LVIDd:         4.80 cm  Diastology LVIDs:         4.00 cm  LV e' medial:    6.20 cm/s LV PW:         1.00 cm  LV E/e' medial:  12.2 LV IVS:        0.80 cm  LV e' lateral:   8.05 cm/s LVOT diam:     1.90 cm  LV E/e' lateral: 9.4 LV SV:         58 LV SV Index:   33 LVOT Area:     2.84 cm  RIGHT VENTRICLE RV Basal diam:  3.10 cm RV S prime:     10.90 cm/s TAPSE (M-mode): 1.9 cm LEFT ATRIUM             Index       RIGHT ATRIUM           Index LA diam:        3.20 cm 1.80 cm/m  RA Area:     12.70 cm LA Vol (A2C):   44.3 ml 24.97 ml/m RA Volume:   33.90 ml  19.11 ml/m LA Vol (A4C):   55.3 ml 31.17 ml/m LA Biplane Vol: 54.5 ml 30.72 ml/m  AORTIC VALVE AV Area (Vmax):    2.16 cm AV Area (Vmean):   1.97 cm AV Area (VTI):     2.01 cm AV Vmax:            134.00 cm/s AV Vmean:          92.200 cm/s AV VTI:            0.290 m AV Peak Grad:      7.2 mmHg AV Mean Grad:      4.0 mmHg LVOT Vmax:         102.00 cm/s LVOT Vmean:        64.000 cm/s LVOT VTI:          0.206 m LVOT/AV VTI ratio: 0.71  AORTA Ao Root diam: 3.00 cm Ao Asc diam:  3.00 cm MITRAL VALVE               TRICUSPID VALVE MV Area (PHT): 2.73 cm    TR Peak grad:   20.8 mmHg MV Area VTI:   3.06 cm    TR Vmax:        228.00 cm/s MV Peak grad:  2.4  mmHg MV Mean grad:  1.0 mmHg    SHUNTS MV Vmax:       0.77 m/s    Systemic VTI:  0.21 m MV Vmean:      55.6 cm/s   Systemic Diam: 1.90 cm MV Decel Time: 278 msec MV E velocity: 75.40 cm/s MV A velocity: 67.70 cm/s MV E/A ratio:  1.11 Jenkins Rouge MD Electronically signed by Jenkins Rouge MD Signature Date/Time: 12/27/2020/4:19:51 PM    Final     Microbiology: Recent Results (from the past 240 hour(s))  Resp Panel by RT-PCR (Flu A&B, Covid) Nasopharyngeal Swab     Status: None   Collection Time: 12/26/20  6:28 PM   Specimen: Nasopharyngeal Swab; Nasopharyngeal(NP) swabs in vial transport medium  Result Value Ref Range Status   SARS Coronavirus 2 by RT PCR NEGATIVE NEGATIVE Final    Comment: (NOTE) SARS-CoV-2 target nucleic acids are NOT DETECTED.  The SARS-CoV-2 RNA is generally detectable in upper respiratory specimens during the acute phase of infection. The lowest concentration of SARS-CoV-2 viral copies this assay can detect is 138 copies/mL. A negative result does not preclude SARS-Cov-2 infection and should not be used as the sole basis for treatment or other patient management decisions. A negative result may occur with  improper specimen collection/handling, submission of specimen other than nasopharyngeal swab, presence of viral mutation(s) within the areas targeted by this assay, and inadequate number of viral copies(<138 copies/mL). A negative result must be combined with clinical observations, patient history, and  epidemiological information. The expected result is Negative.  Fact Sheet for Patients:  EntrepreneurPulse.com.au  Fact Sheet for Healthcare Providers:  IncredibleEmployment.be  This test is no t yet approved or cleared by the [place] FDA and  has been authorized for detection and/or diagnosis of SARS-CoV-2 by FDA under an Emergency Use Authorization (EUA). This EUA will remain  in effect (meaning this test can be used) for the duration of the COVID-19 declaration under Section 564(b)(1) of the Act, 21 U.S.C.section 360bbb-3(b)(1), unless the authorization is terminated  or revoked sooner.       Influenza A by PCR NEGATIVE NEGATIVE Final   Influenza B by PCR NEGATIVE NEGATIVE Final    Comment: (NOTE) The Xpert Xpress SARS-CoV-2/FLU/RSV plus assay is intended as an aid in the diagnosis of influenza from Nasopharyngeal swab specimens and should not be used as a sole basis for treatment. Nasal washings and aspirates are unacceptable for Xpert Xpress SARS-CoV-2/FLU/RSV testing.  Fact Sheet for Patients: EntrepreneurPulse.com.au  Fact Sheet for Healthcare Providers: IncredibleEmployment.be  This test is not yet approved or cleared by the [place] FDA and has been authorized for detection and/or diagnosis of SARS-CoV-2 by FDA under an Emergency Use Authorization (EUA). This EUA will remain in effect (meaning this test can be used) for the duration of the COVID-19 declaration under Section 564(b)(1) of the Act, 21 U.S.C. section 360bbb-3(b)(1), unless the authorization is terminated or revoked.  Performed at [facility], [address] [place], [place] [address]      Labs: Basic Metabolic Panel: Recent Labs  Lab 12/26/20 1755 12/28/20 0508  NA 138 138  K 3.7 3.8  CL 104 106  CO2 23 23  GLUCOSE 83 119*  BUN 10 11  CREATININE 0.55 0.60  CALCIUM 8.6* 8.2*  MG  --  2.1   PHOS  --  2.9   Liver Function Tests: Recent Labs  Lab 12/26/20 1755  AST 31  ALT 23  ALKPHOS  40  BILITOT 0.3  PROT 7.6  ALBUMIN 4.0   No results for input(s): LIPASE, AMYLASE in the last 168 hours. No results for input(s): AMMONIA in the last 168 hours. CBC: Recent Labs  Lab 12/26/20 1755  WBC 9.0  NEUTROABS 4.3  HGB 12.8  HCT 39.4  MCV 98.7  PLT 210   Cardiac Enzymes: No results for input(s): CKTOTAL, CKMB, CKMBINDEX, TROPONINI in the last 168 hours. BNP: BNP (last 3 results) Recent Labs    12/26/20 1755  BNP 116.7*    ProBNP (last 3 results) No results for input(s): PROBNP in the last 8760 hours.  CBG: Recent Labs  Lab 12/27/20 1137 12/27/20 1732 12/27/20 2149 12/28/20 0817 12/28/20 1229  GLUCAP 120* 153* 191* 124* 124*       Signed:  Kayleen Memos, MD [facility] 12/28/2020, 3:38 PM

## 2020-12-28 NOTE — Discharge Instructions (Signed)

## 2020-12-28 NOTE — Plan of Care (Signed)

## 2020-12-28 NOTE — Plan of Care (Signed)
Problem: Education: Goal: Knowledge of General Education information will improve Description: Including pain rating scale, medication(s)/side effects and non-pharmacologic comfort measures 12/28/2020 1406 by Camillia Herter, RN Outcome: Adequate for Discharge 12/28/2020 0717 by Camillia Herter, RN Outcome: Progressing   Problem: Health Behavior/Discharge Planning: Goal: Ability to manage health-related needs will improve 12/28/2020 1406 by Camillia Herter, RN Outcome: Adequate for Discharge 12/28/2020 0717 by Camillia Herter, RN Outcome: Progressing   Problem: Clinical Measurements: Goal: Ability to maintain clinical measurements within normal limits will improve 12/28/2020 1406 by Camillia Herter, RN Outcome: Adequate for Discharge 12/28/2020 0717 by Camillia Herter, RN Outcome: Progressing Goal: Will remain free from infection 12/28/2020 1406 by Camillia Herter, RN Outcome: Adequate for Discharge 12/28/2020 0717 by Camillia Herter, RN Outcome: Progressing Goal: Diagnostic test results will improve 12/28/2020 1406 by Camillia Herter, RN Outcome: Adequate for Discharge 12/28/2020 0717 by Camillia Herter, RN Outcome: Progressing Goal: Respiratory complications will improve 12/28/2020 1406 by Camillia Herter, RN Outcome: Adequate for Discharge 12/28/2020 0717 by Camillia Herter, RN Outcome: Progressing Goal: Cardiovascular complication will be avoided 12/28/2020 1406 by Camillia Herter, RN Outcome: Adequate for Discharge 12/28/2020 0717 by Camillia Herter, RN Outcome: Progressing   Problem: Activity: Goal: Risk for activity intolerance will decrease 12/28/2020 1406 by Camillia Herter, RN Outcome: Adequate for Discharge 12/28/2020 0717 by Camillia Herter, RN Outcome: Progressing   Problem: Nutrition: Goal: Adequate nutrition will be maintained 12/28/2020 1406 by Camillia Herter, RN Outcome: Adequate for Discharge 12/28/2020 0717 by Camillia Herter, RN Outcome: Progressing   Problem: Coping: Goal: Level of anxiety  will decrease 12/28/2020 1406 by Camillia Herter, RN Outcome: Adequate for Discharge 12/28/2020 0717 by Camillia Herter, RN Outcome: Progressing   Problem: Elimination: Goal: Will not experience complications related to bowel motility 12/28/2020 1406 by Camillia Herter, RN Outcome: Adequate for Discharge 12/28/2020 0717 by Camillia Herter, RN Outcome: Progressing Goal: Will not experience complications related to urinary retention 12/28/2020 1406 by Camillia Herter, RN Outcome: Adequate for Discharge 12/28/2020 0717 by Camillia Herter, RN Outcome: Progressing   Problem: Pain Managment: Goal: General experience of comfort will improve 12/28/2020 1406 by Camillia Herter, RN Outcome: Adequate for Discharge 12/28/2020 0717 by Camillia Herter, RN Outcome: Progressing   Problem: Safety: Goal: Ability to remain free from injury will improve 12/28/2020 1406 by Camillia Herter, RN Outcome: Adequate for Discharge 12/28/2020 0717 by Camillia Herter, RN Outcome: Progressing   Problem: Skin Integrity: Goal: Risk for impaired skin integrity will decrease 12/28/2020 1406 by Camillia Herter, RN Outcome: Adequate for Discharge 12/28/2020 0717 by Camillia Herter, RN Outcome: Progressing   Problem: Education: Goal: Understanding of cardiac disease, CV risk reduction, and recovery process will improve 12/28/2020 1406 by Camillia Herter, RN Outcome: Adequate for Discharge 12/28/2020 0717 by Camillia Herter, RN Outcome: Progressing Goal: Individualized Educational Video(s) 12/28/2020 1406 by Camillia Herter, RN Outcome: Adequate for Discharge 12/28/2020 0717 by Camillia Herter, RN Outcome: Progressing   Problem: Activity: Goal: Ability to tolerate increased activity will improve 12/28/2020 1406 by Camillia Herter, RN Outcome: Adequate for Discharge 12/28/2020 0717 by Camillia Herter, RN Outcome: Progressing   Problem: Cardiac: Goal: Ability to achieve and maintain adequate cardiovascular perfusion will improve 12/28/2020 1406 by  Camillia Herter, RN Outcome: Adequate for Discharge 12/28/2020 0717 by Camillia Herter, RN Outcome: Progressing   Problem: Health Behavior/Discharge  Planning: Goal: Ability to safely manage health-related needs after discharge will improve 12/28/2020 1406 by Camillia Herter, RN Outcome: Adequate for Discharge 12/28/2020 0717 by Camillia Herter, RN Outcome: Progressing

## 2020-12-28 NOTE — Progress Notes (Signed)
Cardiology Progress Note  Patient ID: Bailey Walker MRN: [id number] DOB: Apr 17, 1946 Date of Encounter: 12/28/2020  Primary Cardiologist: Quay Burow, MD  Subjective   Chief Complaint: None.  HPI: No further chest pain.  Heart rate 68 and regular.  Plans for coronary CTA today.  ROS:  All other ROS reviewed and negative. Pertinent positives noted in the HPI.     Inpatient Medications  Scheduled Meds: . atorvastatin  20 mg Oral Daily  . Chlorhexidine Gluconate Cloth  6 each Topical Daily  . enoxaparin (LOVENOX) injection  40 mg Subcutaneous Q24H  . insulin aspart  0-5 Units Subcutaneous QHS  . insulin aspart  0-9 Units Subcutaneous TID WC  . levothyroxine  75 mcg Oral QAC breakfast  . metoprolol tartrate  25 mg Oral BID  . metoprolol tartrate  50 mg Oral Once  . pantoprazole  40 mg Oral Daily  . prednisoLONE acetate  1 drop Both Eyes Once per day on Mon Wed Fri   Continuous Infusions:  PRN Meds: acetaminophen, nitroGLYCERIN, ondansetron (ZOFRAN) IV, sodium chloride flush   Vital Signs   Vitals:   12/27/20 1232 12/27/20 2024 12/28/20 0510 12/28/20 0713  BP: 134/69 116/90 (!) 137/95 129/75  Pulse: 74 90  81  Resp: 16 18 19    Temp: 98.6 F (37 C) 98.6 F (37 C) 98.2 F (36.8 C)   TempSrc: Oral Oral Oral   SpO2: 95% 97%  97%  Weight:   78.4 kg   Height:        Intake/Output Summary (Last 24 hours) at 12/28/2020 1019 Last data filed at 12/27/2020 2000 Gross per 24 hour  Intake 360 ml  Output 100 ml  Net 260 ml   Last 3 Weights 12/28/2020 12/26/2020 11/15/2020  Weight (lbs) 172 lb 14.4 oz 172 lb 9.6 oz 170 lb  Weight (kg) 78.427 kg 78.291 kg 77.111 kg      Telemetry  Overnight telemetry shows sinus rhythm heart rate in the high 60s, which I personally reviewed.    Physical Exam   Vitals:   12/27/20 1232 12/27/20 2024 12/28/20 0510 12/28/20 0713  BP: 134/69 116/90 (!) 137/95 129/75  Pulse: 74 90  81  Resp: 16 18 19    Temp: 98.6 F (37 C) 98.6 F (37  C) 98.2 F (36.8 C)   TempSrc: Oral Oral Oral   SpO2: 95% 97%  97%  Weight:   78.4 kg   Height:         Intake/Output Summary (Last 24 hours) at 12/28/2020 1019 Last data filed at 12/27/2020 2000 Gross per 24 hour  Intake 360 ml  Output 100 ml  Net 260 ml    Last 3 Weights 12/28/2020 12/26/2020 11/15/2020  Weight (lbs) 172 lb 14.4 oz 172 lb 9.6 oz 170 lb  Weight (kg) 78.427 kg 78.291 kg 77.111 kg    Body mass index is 32.67 kg/m.  General: Well nourished, well developed, in no acute distress Head: Atraumatic, normal size  Eyes: PEERLA, EOMI  Neck: Supple, no JVD Endocrine: No thryomegaly Cardiac: Normal S1, S2; RRR; no murmurs, rubs, or gallops Lungs: Clear to auscultation bilaterally, no wheezing, rhonchi or rales  Abd: Soft, nontender, no hepatomegaly  Ext: No edema, pulses 2+ Musculoskeletal: No deformities, BUE and BLE strength normal and equal Skin: Warm and dry, no rashes   Neuro: Alert and oriented to person, place, time, and situation, CNII-XII grossly intact, no focal deficits  Psych: Normal mood and affect   Labs  High  Sensitivity Troponin:   Recent Labs  Lab 12/26/20 1755 12/26/20 2240 12/27/20 0130 12/27/20 0619 12/27/20 1020  TROPONINIHS 4 5 6 7 6      Cardiac EnzymesNo results for input(s): TROPONINI in the last 168 hours. No results for input(s): TROPIPOC in the last 168 hours.  Chemistry Recent Labs  Lab 12/26/20 1755 12/28/20 0508  NA 138 138  K 3.7 3.8  CL 104 106  CO2 23 23  GLUCOSE 83 119*  BUN 10 11  CREATININE 0.55 0.60  CALCIUM 8.6* 8.2*  PROT 7.6  --   ALBUMIN 4.0  --   AST 31  --   ALT 23  --   ALKPHOS 40  --   BILITOT 0.3  --   GFRNONAA >60 >60  ANIONGAP 11 9    Hematology Recent Labs  Lab 12/26/20 1755  WBC 9.0  RBC 3.99  HGB 12.8  HCT 39.4  MCV 98.7  MCH 32.1  MCHC 32.5  RDW 12.5  PLT 210   BNP Recent Labs  Lab 12/26/20 1755  BNP 116.7*    DDimer  Recent Labs  Lab 12/26/20 1755  DDIMER <0.27      Radiology  DG Chest 1 View  Result Date: 12/26/2020 CLINICAL DATA:  Shortness of breath EXAM: CHEST  1 VIEW COMPARISON:  04/19/2019 FINDINGS: Right Port-A-Cath in place with the tip at the cavoatrial junction. No pneumothorax. Heart and mediastinal contours are within normal limits. No focal opacities or effusions. No acute bony abnormality. IMPRESSION: No active disease. Electronically Signed   By: Rolm Baptise M.D.   On: 12/26/2020 18:46   ECHOCARDIOGRAM COMPLETE  Result Date: 12/27/2020    ECHOCARDIOGRAM REPORT   Patient Name:   Bailey Walker [facility] Date of Exam: 12/27/2020 Medical Rec #:  [id number]            Height:       61.0 in Accession #:    [id number]           Weight:       172.6 lb Date of Birth:  1946-09-20            BSA:          1.774 m Patient Age:    75 years             BP:           134/69 mmHg Patient Gender: F                    HR:           74 bpm. Exam Location:  Inpatient Procedure: 2D Echo, Cardiac Doppler and Color Doppler Indications:    Chest pain  History:        Patient has prior history of Echocardiogram examinations, most                 recent 09/09/2017. Risk Factors:Hypertension, Diabetes and                 Dyslipidemia. GERD.  Sonographer:    Clayton Lefort RDCS (AE) Referring Phys: [id number] [facility]  Sonographer Comments: Suboptimal subcostal window. IMPRESSIONS  1. Left ventricular ejection fraction, by estimation, is 55 to 60%. The left ventricle has normal function. The left ventricle has no regional wall motion abnormalities. Left ventricular diastolic parameters were normal.  2. Right ventricular systolic function is normal. The right ventricular size is normal.  3. The mitral valve is normal in structure.  Trivial mitral valve regurgitation. No evidence of mitral stenosis.  4. The aortic valve is tricuspid. There is moderate calcification of the aortic valve. Aortic valve regurgitation is not visualized. Mild to moderate aortic valve sclerosis/calcification is  present, without any evidence of aortic stenosis.  5. The inferior vena cava is normal in size with greater than 50% respiratory variability, suggesting right atrial pressure of 3 mmHg. FINDINGS  Left Ventricle: Left ventricular ejection fraction, by estimation, is 55 to 60%. The left ventricle has normal function. The left ventricle has no regional wall motion abnormalities. The left ventricular internal cavity size was normal in size. There is  no left ventricular hypertrophy. Left ventricular diastolic parameters were normal. Right Ventricle: The right ventricular size is normal. No increase in right ventricular wall thickness. Right ventricular systolic function is normal. Left Atrium: Left atrial size was normal in size. Right Atrium: Right atrial size was normal in size. Pericardium: There is no evidence of pericardial effusion. Mitral Valve: The mitral valve is normal in structure. Trivial mitral valve regurgitation. No evidence of mitral valve stenosis. MV peak gradient, 2.4 mmHg. The mean mitral valve gradient is 1.0 mmHg. Tricuspid Valve: The tricuspid valve is normal in structure. Tricuspid valve regurgitation is mild . No evidence of tricuspid stenosis. Aortic Valve: The aortic valve is tricuspid. There is moderate calcification of the aortic valve. Aortic valve regurgitation is not visualized. Mild to moderate aortic valve sclerosis/calcification is present, without any evidence of aortic stenosis. Aortic valve mean gradient measures 4.0 mmHg. Aortic valve peak gradient measures 7.2 mmHg. Aortic valve area, by VTI measures 2.01 cm. Pulmonic Valve: The pulmonic valve was normal in structure. Pulmonic valve regurgitation is not visualized. No evidence of pulmonic stenosis. Aorta: The aortic root is normal in size and structure. Venous: The inferior vena cava is normal in size with greater than 50% respiratory variability, suggesting right atrial pressure of 3 mmHg. IAS/Shunts: The interatrial septum was  not well visualized.  LEFT VENTRICLE PLAX 2D LVIDd:         4.80 cm  Diastology LVIDs:         4.00 cm  LV e' medial:    6.20 cm/s LV PW:         1.00 cm  LV E/e' medial:  12.2 LV IVS:        0.80 cm  LV e' lateral:   8.05 cm/s LVOT diam:     1.90 cm  LV E/e' lateral: 9.4 LV SV:         58 LV SV Index:   33 LVOT Area:     2.84 cm  RIGHT VENTRICLE RV Basal diam:  3.10 cm RV S prime:     10.90 cm/s TAPSE (M-mode): 1.9 cm LEFT ATRIUM             Index       RIGHT ATRIUM           Index LA diam:        3.20 cm 1.80 cm/m  RA Area:     12.70 cm LA Vol (A2C):   44.3 ml 24.97 ml/m RA Volume:   33.90 ml  19.11 ml/m LA Vol (A4C):   55.3 ml 31.17 ml/m LA Biplane Vol: 54.5 ml 30.72 ml/m  AORTIC VALVE AV Area (Vmax):    2.16 cm AV Area (Vmean):   1.97 cm AV Area (VTI):     2.01 cm AV Vmax:  134.00 cm/s AV Vmean:          92.200 cm/s AV VTI:            0.290 m AV Peak Grad:      7.2 mmHg AV Mean Grad:      4.0 mmHg LVOT Vmax:         102.00 cm/s LVOT Vmean:        64.000 cm/s LVOT VTI:          0.206 m LVOT/AV VTI ratio: 0.71  AORTA Ao Root diam: 3.00 cm Ao Asc diam:  3.00 cm MITRAL VALVE               TRICUSPID VALVE MV Area (PHT): 2.73 cm    TR Peak grad:   20.8 mmHg MV Area VTI:   3.06 cm    TR Vmax:        228.00 cm/s MV Peak grad:  2.4 mmHg MV Mean grad:  1.0 mmHg    SHUNTS MV Vmax:       0.77 m/s    Systemic VTI:  0.21 m MV Vmean:      55.6 cm/s   Systemic Diam: 1.90 cm MV Decel Time: 278 msec MV E velocity: 75.40 cm/s MV A velocity: 67.70 cm/s MV E/A ratio:  1.11 Jenkins Rouge MD Electronically signed by Jenkins Rouge MD Signature Date/Time: 12/27/2020/4:19:51 PM    Final     Cardiac Studies  TTE 12/27/2020 1. Left ventricular ejection fraction, by estimation, is 55 to 60%. The  left ventricle has normal function. The left ventricle has no regional  wall motion abnormalities. Left ventricular diastolic parameters were  normal.  2. Right ventricular systolic function is normal. The right  ventricular  size is normal.  3. The mitral valve is normal in structure. Trivial mitral valve  regurgitation. No evidence of mitral stenosis.  4. The aortic valve is tricuspid. There is moderate calcification of the  aortic valve. Aortic valve regurgitation is not visualized. Mild to  moderate aortic valve sclerosis/calcification is present, without any  evidence of aortic stenosis.  5. The inferior vena cava is normal in size with greater than 50%  respiratory variability, suggesting right atrial pressure of 3 mmHg.   Patient Profile  Davanna Summerlyn Fickel is a 75 y.o. female with diabetes, hypertension, tobacco abuse, stage IIIb colon cancer who was admitted on 12/27/2020 with persistent chest pain.  Troponins negative.  EKG without ischemic changes.  Echo with normal wall motion.  Assessment & Plan   1.  Atypical chest pain -Intermittent atypical chest pain for several months.  Occurred more persistently the day prior to admission.  Troponins negative.  Echo with no regional wall motion normalities.  EKG without ischemic changes.  This is not an acute coronary syndrome. -Chest pain has resolved. -Plans for coronary CTA today.  Heart rate appears appropriate for decent scan. -Anticipate discharge pending results.  For questions or updates, please contact [place] Please consult [email or web address] for contact info under   Time Spent with Patient: I have spent a total of 25 minutes with patient reviewing hospital notes, telemetry, EKGs, labs and examining the patient as well as establishing an assessment and plan that was discussed with the patient.  > 50% of time was spent in direct patient care.    Signed, Addison Naegeli. Audie Box, MD, [place]  12/28/2020 10:19 AM

## 2020-12-28 NOTE — Plan of Care (Signed)

## 2021-01-01 NOTE — Progress Notes (Signed)
Cardiology Clinic Note   Patient Name: Bailey Walker Date of Encounter: 01/03/2021  Primary Care Provider:  [name], MD Primary Cardiologist:  Quay Burow, MD  Patient [place] 75 year old female presents the clinic today for follow-up evaluation of her hypertension, chest pain, and preoperative cardiac evaluation.  Past Medical History    Past Medical History:  Diagnosis Date  . 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 Diabetes mellitus without complication ([place])   . Goals of care, counseling/discussion 04/26/2020  . Hypertension   . SVT (supraventricular tachycardia) ([place])   . Thyroid disease    Past Surgical History:  Procedure Laterality Date  . APPENDECTOMY    . BIOPSY  04/18/2020   Procedure: BIOPSY;  Surgeon: Lavena Bullion, DO;  Location: [facility];  Service: Gastroenterology;;  . BIOPSY  04/19/2020   Procedure: BIOPSY;  Surgeon: Lavena Bullion, DO;  Location: [facility];  Service: Gastroenterology;;  . BREAST SURGERY     Fatty tissue on biopsy  . ESOPHAGOGASTRODUODENOSCOPY (EGD) WITH PROPOFOL N/A 04/18/2020   Procedure: ESOPHAGOGASTRODUODENOSCOPY (EGD) WITH PROPOFOL;  Surgeon: Lavena Bullion, DO;  Location: [facility];  Service: Gastroenterology;  Laterality: N/A;  . EYE SURGERY Bilateral    april and march 2019 for cataracts.   . fatty gland     left wrist , right breast  . FLEXIBLE SIGMOIDOSCOPY N/A 04/18/2020   Procedure: FLEXIBLE SIGMOIDOSCOPY;  Surgeon: Lavena Bullion, DO;  Location: [facility];  Service: Gastroenterology;  Laterality: N/A;  . FLEXIBLE SIGMOIDOSCOPY N/A 04/19/2020   Procedure: FLEXIBLE SIGMOIDOSCOPY;  Surgeon: Lavena Bullion, DO;  Location: [facility];  Service: Gastroenterology;  Laterality: N/A;  . INCISION AND DRAINAGE OF WOUND N/A 05/01/2020   Procedure: ABDOMINAL  WOUND EXPLORATION; IRRIGATION AND DEBRIDEMENT WOUND;  Surgeon: Rolm Bookbinder, MD;  Location: [facility];  Service: General;  Laterality: N/A;  . IR IMAGING GUIDED PORT INSERTION  06/13/2020  . KNEE SURGERY     left knee  . LAPAROTOMY N/A 04/20/2020   Procedure: SIGMOID COLECTOMY AND COLOSTOMY;  Surgeon: Coralie Keens, MD;  Location: [facility];  Service: General;  Laterality: N/A;  . SUBMUCOSAL TATTOO INJECTION  04/19/2020   Procedure: SUBMUCOSAL TATTOO INJECTION;  Surgeon: Lavena Bullion, DO;  Location: [facility];  Service: Gastroenterology;;    Allergies  Allergies  Allergen Reactions  . Nitroglycerin Nausea And Vomiting  . Ampicillin Other (See Comments)    Per allergy test    History of Present Illness    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  She presented to the emergency department on 12/26/2020 and was discharged on 12/28/2020.  She had an EKG that was negative for acute changes.  Her high-sensitivity troponins were negative x2.  Her D-dimer was negative.  She had no significant elevation in her BNP.  Both her flu and Covid swabs were negative.  CXR showed no active disease.  She speaks Aramaic.  She stated that she was at work previous day and in the afternoon around 330 she started noticing chest pressure, dizziness, shortness of breath.  Her niece went home and took her to the emergency room.  Her niece reported that she has been complaining of chest pain on and off since 2013 when she quit smoking.  She reported that she had been seen by  cardiologist a few years prior and was told that her heart was normal.  She reported that she typically notices chest pain with exertion however on the day of the event that was at rest.  On 12/28/2020 her niece was at bedside.  The patient stated that she had no new complaints.  She denied chest pain, dyspnea, palpitations.  It was felt that she may have unstable angina.  On admission  she was started on heparin gtt.  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  A less than 10% small calcification was also noted in the LAD and circumflex arteries.  Management with aspirin and statin were recommended.  Her niece reported that she had finished her chemotherapy in January 2022 and is currently on observation for her adenocarcinoma of the sigmoid.  She follows up in the clinic today for reevaluation of her chest discomfort and preoperative cardiac evaluation.  She states she feels well.  She does notice some reflux type symptoms and describes some fullness in her throat.  She reports she is taking Dexilant for quite some time.  We reviewed her coronary CTA.  She remains fairly physically active doing housework.  We discussed the importance of heart healthy diet.  I will have her stop her Dexilant, start Protonix, increase her physical activity as tolerated, do her physical therapy exercises for her back 2-3 times per week, and have her follow-up in 6 months.  Today she denies chest pain, shortness of breath, lower extremity edema, fatigue, palpitations, melena, hematuria, hemoptysis, diaphoresis, weakness, presyncope, syncope, orthopnea, and PND.   Home Medications    Prior to Admission medications   Medication Sig Start Date End Date Taking? Authorizing Provider  aspirin EC 81 MG tablet Take 81 mg by mouth daily. Swallow whole.    [provider]  atorvastatin (LIPITOR) 10 MG tablet Take 2 tablets (20 mg total) by mouth daily. 12/28/20 03/28/21  Kayleen Memos, DO  denosumab (PROLIA) 60 MG/ML SOSY injection Inject 60 mg into the skin every 6 (six) months.  Patient not taking: Reported on 11/15/2020    [provider]  DEXILANT 30 MG  capsule TAKE 1 CAPSULE BY MOUTH EVERY DAY Patient taking differently: Take 30 mg by mouth daily. 10/20/19   Tresa Garter, MD  docusate sodium (COLACE) 100 MG capsule Take 1 capsule (100 mg total) by mouth 2 (two) times daily. 05/02/20   Raiford Noble Latif, DO  feeding supplement, ENSURE ENLIVE, (ENSURE ENLIVE) LIQD Take 237 mLs by mouth 3 (three) times daily between meals. 05/02/20   Sheikh, Omair Latif, DO  glipiZIDE (GLUCOTROL) 5 MG tablet TAKE 1 TABLET BY MOUTH TWICE A DAY BEFORE MEALS Patient taking differently: Take 5 mg by mouth See admin instructions. Take one tablet (5 mg) by mouth twice daily - after lunch and at midnight 11/04/19   Dorena Dew, FNP  levothyroxine (SYNTHROID) 75 MCG tablet TAKE 1 TABLET BY MOUTH EVERY DAY Patient taking differently: Take 75 mcg by mouth daily before breakfast. 01/02/20   Dorena Dew, FNP  lidocaine-prilocaine (EMLA) cream Apply to affected area once 06/11/20   Volanda Napoleon, MD  metFORMIN (GLUCOPHAGE) 500 MG tablet Take 0.5 tablets (250 mg total) by mouth 2 (two) times daily with a meal. Patient taking differently: Take 500  mg by mouth See admin instructions. Take one tablet (500 mg) by mouth twice daily - after lunch and at midnight 05/16/19   [name], FNP  methocarbamol (ROBAXIN) 500 MG tablet TAKE 1 TABLET BY MOUTH EVERY 12 HOURS AS NEEDED FOR MUSCLE SPASMS. 11/20/20   Volanda Napoleon, MD  metoprolol tartrate (LOPRESSOR) 25 MG tablet Take 1 tablet (25 mg total) by mouth 2 (two) times daily. 08/02/19   Tresa Garter, MD  Multiple Vitamin (MULTIVITAMIN WITH MINERALS) TABS tablet Take 1 tablet by mouth daily. 05/02/20   Raiford Noble Latif, DO  nitroGLYCERIN (NITROSTAT) 0.4 MG SL tablet Place 1 tablet (0.4 mg total) under the tongue every 5 (five) minutes as needed for chest pain. 05/16/19 04/16/20  [name], [facility]    [provider]  nystatin (MYCOSTATIN/NYSTOP) powder  Apply topically. 07/11/20   [provider]  prednisoLONE acetate (PRED FORTE) 1 % ophthalmic suspension Place 1 drop into both eyes See admin instructions. Instill one drop into both eyes on Monday, Wednesday, Friday nights    [provider]  traMADol (ULTRAM) 50 MG tablet Take 50 mg by mouth every 6 (six) hours as needed. 07/10/20   [provider]    Family History    Family History  Problem Relation Age of Onset  . Breast cancer Cousin        mat and pat sides  . Cancer Sister        cancer everywhere  . Heart disease Brother   . Colon cancer Neg Hx   . Esophageal cancer Neg Hx   . Rectal cancer Neg Hx    She indicated that her mother is deceased. She indicated that her father is deceased. She indicated that her sister is deceased. She indicated that her brother is alive. She indicated that her cousin is deceased. She indicated that the status of her neg hx is unknown.  Social History    Social History   Socioeconomic History  . Marital status: Single    Spouse name: Not on file  . Number of children: 0  . Years of education: Not on file  . Highest education level: Not on file  Occupational History  . Occupation: retired  Tobacco Use  . Smoking status: Former Smoker    Quit date: 12/29/2013    Years since quitting: 7.0  . Smokeless tobacco: Never Used  Vaping Use  . Vaping Use: Never used  Substance and Sexual Activity  . Alcohol use: No  . Drug use: No  . Sexual activity: Not on file  Other Topics Concern  . Not on file  Social History Narrative  . Not on file   Social Determinants of Health   Financial Resource Strain: Not on file  Food Insecurity: Not on file  Transportation Needs: Not on file  Physical Activity: Not on file  Stress: Not on file  Social Connections: Not on file  Intimate Partner Violence: Not on file     Review of Systems    General:  No chills, fever, night sweats or weight changes.  Cardiovascular:  No  chest pain, dyspnea on exertion, edema, orthopnea, palpitations, paroxysmal nocturnal dyspnea. Dermatological: No rash, lesions/masses Respiratory: No cough, dyspnea Urologic: No hematuria, dysuria Abdominal:   No nausea, vomiting, diarrhea, bright red blood per rectum, melena, or hematemesis Neurologic:  No visual changes, wkns, changes in mental status. All other systems reviewed and are otherwise negative except as noted above.  Physical Exam    VS:  BP 120/72   Ht 5\' 1"  (1.549 m)   Wt 174 lb 9.6 oz (79.2 kg)   BMI 32.99 kg/m  , BMI Body mass index is 32.99 kg/m. GEN: Well nourished, well developed, in no acute distress. HEENT: normal. Neck: Supple, no JVD, carotid bruits, or masses. Cardiac: RRR, no murmurs, rubs, or gallops. No clubbing, cyanosis, edema.  Radials/DP/PT 2+ and equal bilaterally.  Respiratory:  Respirations regular and unlabored, clear to auscultation bilaterally. GI: Soft, nontender, nondistended, BS + x 4. MS: no deformity or atrophy. Skin: warm and dry, no rash. Neuro:  Strength and sensation are intact. Psych: Normal affect.  Accessory Clinical Findings    Recent Labs: 04/25/2020: TSH 15.312 12/26/2020: ALT 23; B Natriuretic Peptide 116.7; Hemoglobin 12.8; Platelets 210 12/28/2020: BUN 11; Creatinine, Ser 0.60; Magnesium 2.1; Potassium 3.8; Sodium 138   Recent Lipid Panel    Component Value Date/Time   CHOL 207 (H) 12/27/2020 1020   TRIG 240 (H) 12/27/2020 1020   HDL 50 12/27/2020 1020   CHOLHDL 4.1 12/27/2020 1020   VLDL 48 (H) 12/27/2020 1020   LDLCALC 109 (H) 12/27/2020 1020    ECG personally reviewed by me today-none today.  Echocardiogram 12/28/2010 IMPRESSIONS    1. Left ventricular ejection fraction, by estimation, is 55 to 60%. The  left ventricle has normal function. The left ventricle has no regional  wall motion abnormalities. Left ventricular diastolic parameters were  normal.  2. Right ventricular systolic function is normal. The  right ventricular  size is normal.  3. The mitral valve is normal in structure. Trivial mitral valve  regurgitation. No evidence of mitral stenosis.  4. The aortic valve is tricuspid. There is moderate calcification of the  aortic valve. Aortic valve regurgitation is not visualized. Mild to  moderate aortic valve sclerosis/calcification is present, without any  evidence of aortic stenosis.  5. The inferior vena cava is normal in size with greater than 50%  respiratory variability, suggesting right atrial pressure of 3 mmHg.  Coronary CTA 12/28/2020  FINDINGS: A 100 kV prospective scan was triggered in the descending thoracic aorta at 111 HU's. Axial non-contrast 3 mm slices were carried out through the heart. The data set was analyzed on a dedicated work station and scored using the Tillar. Gantry rotation speed was 250 msecs and collimation was .6 mm. No beta blockade and 0.8 mg of sl NTG was given. The 3D data set was reconstructed in 5% intervals of the 67-82 % of the R-R cycle. Diastolic phases were analyzed on a dedicated work station using MPR, MIP and VRT modes. The patient received 100 cc of contrast.  Aorta: Normal size. Descending aortic atherosclerosis noted. No dissection.  Aortic Valve:  Tri-leaflet.  No calcifications.  Coronary Arteries:  Normal coronary origin.  Co-dominance.  Coronary Calcium Score:  Left main: 13  Left anterior descending artery: 0  Left circumflex artery: 207  Right coronary artery: 57  Total: 277  Percentile: 76th for age, sex, and race matched control.  RCA is a co-dominant artery that gives rise to PDA and PLA. There is a minimal non-obstructive (1-24%) calcified plaque in the proximal vessel. There is a minimal non-obstructive (1-24%) calcified plaque in the mid vessel. There appears to be a breathing/stair step artifact in the mid RCA.  Left main is a large artery that gives rise to LAD and LCX  arteries. There is a less than 10% small calcification in the mid vessel.  LAD is a large vessel that gives rise to one small D1 vessel. There is a minimal non-obstructive (1-24%) soft plaque in the proximal vessel.  LCX is a co-dominant artery that gives rise to one large OM1 branch the bifurcates to a large OM2. There is a mild non-obstructive (25-49%) calcified plaque in the proximal vessel. There is a mild non-obstructive (25-49%) calcified plaque in the mid vessel. There is a mild non-obstructive (25-49%) calcified plaque in the distal vessel.  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  Normal left atrial appendage without a thrombus.  Pulmonary artery is at the upper limit of normal - 29 mm.  Extra-cardiac findings: See attached radiology report for non-cardiac structures.  IMPRESSION: 1. Coronary calcium score of 277. This was 76th percentile for age, sex, and race matched control.  2. Normal coronary origin with co-dominance.  3. Descending aortic atherosclerosis noted.  4. CAD-RADS 2. Mild non-obstructive CAD (25-49%). Consider non-atherosclerotic causes of chest pain. Consider preventive therapy and risk factor modification. Additionally there appears to be a breathing/stair step artifact in the mid RCA.  5. 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  6. Pulmonary artery is at the upper limit of normal - 29 mm.   Assessment & Plan   1.  Coronary artery disease-no chest pain today.  Underwent coronary CTA 12/28/20 which showed nonobstructive coronary disease.  ED work-up unremarkable.  Details above.  Medical management was recommended Continue atorvastatin, aspirin, metoprolol Heart healthy low-sodium diet-salty 6 given Increase physical activity as tolerated  Essential hypertension-BP today 120/72 .  Well-controlled  at home. Continue metoprolol Heart healthy low-sodium diet-salty 6 given Increase physical activity as tolerated  Stage IIIb adenocarcinoma-sigmoid.  Has ostomy.  Finished chemotherapy January 2022. Follows with oncology  Hyperlipidemia-12/27/2020: Cholesterol 207; HDL 50; LDL Cholesterol 109; Triglycerides 240; VLDL 48 Continue atorvastatin Heart healthy low-sodium high-fiber diet Increase physical activity as tolerated  GERD-is taking Dexilant for quite some time.  Reports reflux type symptoms. Stop Dexilant Start Protonix GERD diet Weight loss  Preoperative cardiac evaluation-ostomy reversal, [place] surgery, Dr. Heriberto Antigua     Primary Cardiologist: Quay Burow, MD  Chart reviewed as part of pre-operative protocol coverage. Given past medical history and time since last visit, based on ACC/AHA guidelines, Chealsea Koraeel Exantus would be at acceptable risk for the planned procedure without further cardiovascular testing.   Her RCRI is a class II risk, 0.9% risk of major cardiac event.  She is able to complete greater than 4 METS of physical activity.  Patient was advised that if she develops new symptoms prior to surgery to contact our office to arrange a follow-up appointment.  She verbalized understanding.  I will route this recommendation to the requesting party via Epic fax function and remove from pre-op pool.  Please call with questions.   Disposition: Follow-up with Dr. Gwenlyn Found in 6 months.   Jossie Ng. Krystian Ferrentino NP-C    01/03/2021, 8:17 AM [place] [address] Office [phone] Fax [phone]  Notice: This dictation was prepared with Dragon dictation along with smaller phrase technology. Any transcriptional errors that result from this process are unintentional and may not be corrected upon review.  I spent 14 minutes examining this patient, reviewing medications, and using patient centered shared  decision making involving her cardiac care.  Prior to her visit I spent greater than  20 minutes reviewing her past medical history,  medications, and prior cardiac tests.

## 2021-01-03 ENCOUNTER — Encounter: Payer: Self-pay | Admitting: General Practice

## 2021-01-03 ENCOUNTER — Other Ambulatory Visit: Payer: Self-pay

## 2021-01-03 ENCOUNTER — Ambulatory Visit: Payer: Medicaid Other | Admitting: General Practice

## 2021-01-03 VITALS — BP 120/72 | HR 84 | Ht 61.0 in | Wt 174.6 lb

## 2021-01-03 DIAGNOSIS — I2510 Atherosclerotic heart disease of native coronary artery without angina pectoris: Secondary | ICD-10-CM | POA: Diagnosis not present

## 2021-01-03 DIAGNOSIS — Z01810 Encounter for preprocedural cardiovascular examination: Secondary | ICD-10-CM | POA: Diagnosis not present

## 2021-01-03 DIAGNOSIS — C801 Malignant (primary) neoplasm, unspecified: Secondary | ICD-10-CM | POA: Diagnosis not present

## 2021-01-03 DIAGNOSIS — E782 Mixed hyperlipidemia: Secondary | ICD-10-CM

## 2021-01-03 DIAGNOSIS — I10 Essential (primary) hypertension: Secondary | ICD-10-CM

## 2021-01-03 MED ORDER — PANTOPRAZOLE SODIUM 20 MG PO TBEC: 20.0000 mg | DELAYED_RELEASE_TABLET | Freq: Every day | ORAL | 13 refills | Status: DC

## 2021-01-03 NOTE — Patient Instructions (Signed)
Medication Instructions:  STOP DEXILANT   START PANTOPRAZOLE 20MG  DAILY *If you need a refill on your cardiac medications before your next appointment, please call your pharmacy*  Special Instructions PLEASE READ AND FOLLOW GERD DIET-ATTACHED  PLEASE READ AND FOLLOW SALTY 6-ATTACHED-1,800mg  daily  PLEASE INCREASE PHYSICAL ACTIVITY AS TOLERATED-DO PHYSICAL THERAPY EXERCISES 2-3 TIMES A WEEK  Follow-Up: Your next appointment:  6 month(s) In Person with Quay Burow, MD OR IF UNAVAILABLE JESSE CLEAVER, FNP-C   Please call our office 2 months in advance to schedule this appointment   At [facility], you and your health needs are our priority.  As part of our continuing mission to provide you with exceptional heart care, we have created designated Provider Care Teams.  These Care Teams include your primary Cardiologist (physician) and Advanced Practice Providers (APPs -  Physician Assistants and Nurse Practitioners) who all work together to provide you with the care you need, when you need it.            6 SALTY THINGS TO AVOID     1,800MG  DAILY     Food Choices for Gastroesophageal Reflux Disease, Adult When you have gastroesophageal reflux disease (GERD), the foods you eat and your eating habits are very important. Choosing the right foods can help ease your discomfort. Think about working with a food expert (dietitian) to help you make good choices. What are tips for following this plan? Reading food labels  Look for foods that are low in saturated fat. Foods that may help with your symptoms include: ? Foods that have less than 5% of daily value (DV) of fat. ? Foods that have 0 grams of trans fat. Cooking  Do not fry your food.  Cook your food by baking, steaming, grilling, or broiling. These are all methods that do not need a lot of fat for cooking.  To add flavor, try to use herbs that are low in spice and acidity. Meal planning  Choose healthy foods that are low in  fat, such as: ? Fruits and vegetables. ? Whole grains. ? Low-fat dairy products. ? Lean meats, fish, and poultry.  Eat small meals often instead of eating 3 large meals each day. Eat your meals slowly in a place where you are relaxed. Avoid bending over or lying down until 2-3 hours after eating.  Limit high-fat foods such as fatty meats or fried foods.  Limit your intake of fatty foods, such as oils, butter, and shortening.  Avoid the following as told by your doctor: ? Foods that cause symptoms. These may be different for different people. Keep a food diary to keep track of foods that cause symptoms. ? Alcohol. ? Drinking a lot of liquid with meals. ? Eating meals during the 2-3 hours before bed.   Lifestyle  Stay at a healthy weight. Ask your doctor what weight is healthy for you. If you need to lose weight, work with your doctor to do so safely.  Exercise for at least 30 minutes on 5 or more days each week, or as told by your doctor.  Wear loose-fitting clothes.  Do not smoke or use any products that contain nicotine or tobacco. If you need help quitting, ask your doctor.  Sleep with the head of your bed higher than your feet. Use a wedge under the mattress or blocks under the bed frame to raise the head of the bed.  Chew sugar-free gum after meals. What foods should eat? Eat a healthy, well-balanced diet of  fruits, vegetables, whole grains, low-fat dairy products, lean meats, fish, and poultry. Each person is different. Foods that may cause symptoms in one person may not cause any symptoms in another person. Work with your doctor to find foods that are safe for you. The items listed above may not be a complete list of what you can eat and drink. Contact a food expert for more options.   What foods should I avoid? Limiting some of these foods may help in managing the symptoms of GERD. Everyone is different. Talk with a food expert or your doctor to help you find the exact foods  to avoid, if any. Fruits Any fruits prepared with added fat. Any fruits that cause symptoms. For some people, this may include citrus fruits, such as oranges, grapefruit, pineapple, and lemons. Vegetables Deep-fried vegetables. [place] fries. Any vegetables prepared with added fat. Any vegetables that cause symptoms. For some people, this may include tomatoes and tomato products, chili peppers, onions and garlic, and horseradish. Grains Pastries or quick breads with added fat. Meats and other proteins High-fat meats, such as fatty beef or pork, hot dogs, ribs, ham, sausage, salami, and bacon. Fried meat or protein, including fried fish and fried chicken. Nuts and nut butters, in large amounts. Dairy Whole milk and chocolate milk. Sour cream. Cream. Ice cream. Cream cheese. Milkshakes. Fats and oils Butter. Margarine. Shortening. Ghee. Beverages Coffee and tea, with or without caffeine. Carbonated beverages. Sodas. Energy drinks. Fruit juice made with acidic fruits, such as orange or grapefruit. Tomato juice. Alcoholic drinks. Sweets and desserts Chocolate and cocoa. Donuts. Seasonings and condiments Pepper. Peppermint and spearmint. Added salt. Any condiments, herbs, or seasonings that cause symptoms. For some people, this may include curry, hot sauce, or vinegar-based salad dressings. The items listed above may not be a complete list of what you should not eat and drink. Contact a food expert for more options. Questions to ask your doctor Diet and lifestyle changes are often the first steps that are taken to manage symptoms of GERD. If diet and lifestyle changes do not help, talk with your doctor about taking medicines. Where to find more information  International Foundation for Gastrointestinal Disorders: aboutgerd.org Summary  When you have GERD, food and lifestyle choices are very important in easing your symptoms.  Eat small meals often instead of 3 large meals a day. Eat your meals  slowly and in a place where you are relaxed.  Avoid bending over or lying down until 2-3 hours after eating.  Limit high-fat foods such as fatty meats or fried foods. This information is not intended to replace advice given to you by your health care provider. Make sure you discuss any questions you have with your health care provider. Document Revised: 03/19/2020 Document Reviewed: 03/19/2020 Elsevier Patient Education  [place].

## 2021-01-14 ENCOUNTER — Encounter: Payer: Self-pay | Admitting: Hematology & Oncology

## 2021-01-14 ENCOUNTER — Other Ambulatory Visit: Payer: Self-pay

## 2021-01-14 ENCOUNTER — Inpatient Hospital Stay (HOSPITAL_BASED_OUTPATIENT_CLINIC_OR_DEPARTMENT_OTHER): Payer: Medicaid Other | Admitting: Hematology & Oncology

## 2021-01-14 ENCOUNTER — Other Ambulatory Visit: Payer: Self-pay | Admitting: *Deleted

## 2021-01-14 ENCOUNTER — Inpatient Hospital Stay: Payer: Medicaid Other

## 2021-01-14 ENCOUNTER — Inpatient Hospital Stay: Payer: Medicaid Other | Attending: Hematology and Oncology

## 2021-01-14 VITALS — BP 135/73 | HR 85 | Temp 98.2°F | Resp 16 | Wt 179.0 lb

## 2021-01-14 DIAGNOSIS — Z95828 Presence of other vascular implants and grafts: Secondary | ICD-10-CM

## 2021-01-14 DIAGNOSIS — Z933 Colostomy status: Secondary | ICD-10-CM | POA: Insufficient documentation

## 2021-01-14 DIAGNOSIS — C772 Secondary and unspecified malignant neoplasm of intra-abdominal lymph nodes: Secondary | ICD-10-CM

## 2021-01-14 DIAGNOSIS — R109 Unspecified abdominal pain: Secondary | ICD-10-CM | POA: Insufficient documentation

## 2021-01-14 DIAGNOSIS — Z7982 Long term (current) use of aspirin: Secondary | ICD-10-CM | POA: Diagnosis not present

## 2021-01-14 DIAGNOSIS — Z79899 Other long term (current) drug therapy: Secondary | ICD-10-CM | POA: Insufficient documentation

## 2021-01-14 DIAGNOSIS — D510 Vitamin B12 deficiency anemia due to intrinsic factor deficiency: Secondary | ICD-10-CM | POA: Insufficient documentation

## 2021-01-14 DIAGNOSIS — C187 Malignant neoplasm of sigmoid colon: Secondary | ICD-10-CM | POA: Diagnosis not present

## 2021-01-14 LAB — CMP (CANCER CENTER ONLY)
ALT: 19 U/L (ref 0–44)
AST: 22 U/L (ref 15–41)
Albumin: 4 g/dL (ref 3.5–5.0)
Alkaline Phosphatase: 37 U/L — ABNORMAL LOW (ref 38–126)
Anion gap: 11 (ref 5–15)
BUN: 19 mg/dL (ref 8–23)
CO2: 26 mmol/L (ref 22–32)
Calcium: 9.6 mg/dL (ref 8.9–10.3)
Chloride: 101 mmol/L (ref 98–111)
Creatinine: 0.71 mg/dL (ref 0.44–1.00)
GFR, Estimated: >60 mL/min (ref >60)
Glucose, Bld: 216 mg/dL — ABNORMAL HIGH (ref 70–99)
Potassium: 4 mmol/L (ref 3.5–5.1)
Sodium: 138 mmol/L (ref 135–145)
Total Bilirubin: 0.3 mg/dL (ref 0.3–1.2)
Total Protein: 7 g/dL (ref 6.5–8.1)

## 2021-01-14 LAB — CBC WITH DIFFERENTIAL (CANCER CENTER ONLY)
Abs Immature Granulocytes: 0.06 10*3/uL (ref 0.00–0.07)
Basophils Absolute: 0.1 10*3/uL (ref 0.0–0.1)
Basophils Relative: 1 %
Eosinophils Absolute: 0.2 10*3/uL (ref 0.0–0.5)
Eosinophils Relative: 3 %
HCT: 35.8 % — ABNORMAL LOW (ref 36.0–46.0)
Hemoglobin: 11.7 g/dL — ABNORMAL LOW (ref 12.0–15.0)
Immature Granulocytes: 1 %
Lymphocytes Relative: 26 %
Lymphs Abs: 2 10*3/uL (ref 0.7–4.0)
MCH: 32.1 pg (ref 26.0–34.0)
MCHC: 32.7 g/dL (ref 30.0–36.0)
MCV: 98.1 fL (ref 80.0–100.0)
Monocytes Absolute: 0.7 10*3/uL (ref 0.1–1.0)
Monocytes Relative: 9 %
Neutro Abs: 4.8 10*3/uL (ref 1.7–7.7)
Neutrophils Relative %: 60 %
Platelet Count: 230 10*3/uL (ref 150–400)
RBC: 3.65 MIL/uL — ABNORMAL LOW (ref 3.87–5.11)
RDW: 12.4 % (ref 11.5–15.5)
WBC Count: 7.8 10*3/uL (ref 4.0–10.5)
nRBC: 0 % (ref 0.0–0.2)

## 2021-01-14 LAB — CEA (IN HOUSE-CHCC): CEA (CHCC-In House): 2.81 ng/mL (ref 0.00–5.00)

## 2021-01-14 MED ORDER — SODIUM CHLORIDE 0.9% FLUSH
10.0000 mL | Freq: Once | INTRAVENOUS | Status: DC
  Filled 2021-01-14: qty 10

## 2021-01-14 MED ORDER — SODIUM CHLORIDE 0.9% FLUSH
10.0000 mL | Freq: Once | INTRAVENOUS | Status: AC
  Administered 2021-01-14: 10 mL via INTRAVENOUS
  Filled 2021-01-14: qty 10

## 2021-01-14 MED ORDER — HEPARIN SOD (PORK) LOCK FLUSH 100 UNIT/ML IV SOLN
500.0000 [IU] | Freq: Once | INTRAVENOUS | Status: DC
  Filled 2021-01-14: qty 5

## 2021-01-14 MED ORDER — HEPARIN SOD (PORK) LOCK FLUSH 100 UNIT/ML IV SOLN
500.0000 [IU] | Freq: Once | INTRAVENOUS | Status: AC
  Administered 2021-01-14: 500 [IU] via INTRAVENOUS
  Filled 2021-01-14: qty 5

## 2021-01-14 NOTE — Progress Notes (Signed)
Pt. Is accompanied by daughter who interprets for her.

## 2021-01-14 NOTE — Patient Instructions (Signed)
Tunneled Central Venous Catheter Flushing Guide  It is important to flush your tunneled central venous catheter each time you use it, both before and after you use it. Flushing your catheter will help prevent it from clogging. What are the risks? Risks may include:  Infection.  Air getting into the catheter and bloodstream. Supplies needed:  A clean pair of gloves.  A disinfecting wipe. Use an alcohol wipe, chlorhexidine wipe, or iodine wipe as told by your health care provider.  A 10 mL syringe that has been prefilled with saline solution.  An empty 10 mL syringe, if a substance called heparin was injected into your catheter. How to flush your catheter When you flush your catheter, make sure you follow any specific instructions from your health care provider or the manufacturer. These are general guidelines. Flushing your catheter before use If there is heparin in your catheter: 1. Wash your hands with soap and water. 2. Put on gloves. 3. Scrub the injection cap for a minimum of 15 seconds with a disinfecting wipe. 4. Unclamp the catheter. 5. Attach the empty syringe to the injection cap. 6. Pull the syringe plunger back and withdraw 10 mL of blood. 7. Place the syringe into an appropriate waste container. 8. Scrub the injection cap for 15 seconds with a disinfecting wipe. 9. Attach the prefilled syringe to the injection cap. 10. Flush the catheter by pushing the plunger forward until all the liquid from the syringe is in the catheter. 11. Remove the syringe from the injection cap. 12. Clamp the catheter. If there is no heparin in your catheter: 1. Wash your hands with soap and water. 2. Put on gloves. 3. Scrub the injection cap for 15 seconds with a disinfecting wipe. 4. Unclamp the catheter. 5. Attach the prefilled syringe to the injection cap. 6. Flush the catheter by pushing the plunger forward until 5 mL of the liquid from the syringe is in the catheter. 7. Pull back on  the syringe until you see blood in the catheter. 8. If you have been asked to collect any blood, follow your health care provider's instructions. Otherwise, flush the catheter with the rest of the solution from the syringe. 9. Remove the syringe from the injection cap. 10. Clamp the catheter.   Flushing your catheter after use 1. Wash your hands with soap and water. 2. Put on gloves. 3. Scrub the injection cap for 15 seconds with a disinfecting wipe. 4. Unclamp the catheter. 5. Attach the prefilled syringe to the injection cap. 6. Flush the catheter by pushing the plunger forward until all of the liquid from the syringe is in the catheter. 7. Remove the syringe from the injection cap. 8. Clamp the catheter. Problems and solutions  If blood cannot be completely cleared from the injection cap, you may need to have the injection cap replaced.  If the catheter is difficult to flush, use the pulsing method. The pulsing method involves pushing only a few milliliters of solution into the catheter at a time and pausing between pushes.  If you do not see blood in the catheter when you pull back on the syringe, change your body position, such as by raising your arms above your head. Take a deep breath and cough. Then, pull back on the syringe. If you still do not see blood, flush the catheter with a small amount of solution. Then, change positions again and take a breath or cough. Pull back on the syringe again. If you still do not   see blood, finish flushing the catheter and contact your health care provider. Do not use your catheter until your health care provider says it is okay. General tips  Have someone help you flush your catheter, if possible.  Do not force fluid through your catheter.  Do not use a syringe that is larger or smaller than 10 mL. Using a smaller syringe can make the catheter burst.  Do not use your catheter without flushing it first if it has heparin in it. Contact a health  care provider if:  You cannot see any blood in the catheter when you flush it before using it.  Your catheter is difficult to flush. Get help right away if:  You cannot flush the catheter.  The catheter leaks when you flush it or when there is fluid in it.  There are cracks or breaks in the catheter. Summary  It is important to flush your tunneled central venous catheter each time you use it, both before and after you use it.  Scrub the injection cap for 15 seconds with a disinfecting wipe before and after you flush it.  When you flush your catheter, make sure you follow any specific instructions from your health care provider or the manufacturer.  Get help right away if you cannot flush the catheter. This information is not intended to replace advice given to you by your health care provider. Make sure you discuss any questions you have with your health care provider. Document Revised: 11/17/2019 Document Reviewed: 11/24/2018 Elsevier Patient Education  2021 Elsevier Inc.  

## 2021-01-14 NOTE — Progress Notes (Signed)
Hematology and Oncology Follow Up Visit  Bailey Walker [id number] May 21, 1946 75 y.o. 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  She she was in the hospital in early April.  She was having some chest pain.  Did not look like the work-up was remarkable.  She had a CT of the chest.  She had a actual CT coronary study bone done.  Again I will think cardiology found anything that was significant.  She is yet to have the colostomy reversal.  She is having more abdominal pain.  I suspect this probably is from having the colostomy.  I am not sure why the colostomy is going to be reversed.  She otherwise seems to be doing okay.  She is eating okay.  She is going to the bathroom.  She is having no bleeding.  There is no fever.  She has had no cough or shortness of breath.  She has had no rashes.  There has been no leg swelling.  Her appetite has been pretty good.  Overall, I would say performance status is ECOG 1.   Medications:  Allergies as of 01/14/2021      Reactions   Nitroglycerin Nausea And Vomiting   Ampicillin Other (See Comments)   Per allergy test      Medication List       Accurate as of January 14, 2021 11:38 AM. If you have any questions, ask your nurse or doctor.        aspirin EC 81 MG tablet Take 81 mg by mouth daily. Swallow whole.   atorvastatin 10 MG tablet Commonly known as: LIPITOR Take 2 tablets (20 mg total) by mouth daily.   denosumab 60 MG/ML Sosy injection Commonly known as: PROLIA Inject 60 mg into the skin every 6 (six) months.   docusate sodium 100 MG capsule Commonly known as: COLACE Take 1 capsule (100 mg total) by mouth 2 (two) times daily.    feeding supplement Liqd Take 237 mLs by mouth 3 (three) times daily between meals.   glipiZIDE 5 MG tablet Commonly known as: GLUCOTROL TAKE 1 TABLET BY MOUTH TWICE A DAY BEFORE MEALS What changed: See the new instructions.   levothyroxine 75 MCG tablet Commonly known as: SYNTHROID TAKE 1 TABLET BY MOUTH EVERY DAY What changed: when to take this   lidocaine-prilocaine cream Commonly known as: EMLA Apply to affected area once   metFORMIN 500 MG tablet Commonly known as: GLUCOPHAGE Take 0.5 tablets (250 mg total) by mouth 2 (two) times daily with a meal. What changed:   how much to take  when to take this  additional instructions   methocarbamol 500 MG tablet Commonly known as: ROBAXIN TAKE 1 TABLET BY MOUTH EVERY 12 HOURS AS NEEDED FOR MUSCLE SPASMS.   metoprolol tartrate 25 MG tablet Commonly known as: LOPRESSOR Take 1 tablet (25 mg total) by mouth 2 (two) times daily.   multivitamin with minerals Tabs tablet Take 1 tablet by mouth daily.   nitroGLYCERIN 0.4 MG SL tablet Commonly known as: NITROSTAT Place 1 tablet (0.4 mg total) under the tongue every 5 (five) minutes as needed for chest pain.   NON FORMULARY [employer]  nystatin powder Commonly known as: MYCOSTATIN/NYSTOP Apply topically.   pantoprazole 20 MG tablet Commonly known as: Protonix Take 1 tablet (20 mg total) by mouth daily.   prednisoLONE acetate 1 % ophthalmic suspension Commonly known as: PRED FORTE Place 1 drop into both eyes See admin instructions. Instill one drop into both eyes on Monday, Wednesday, Friday nights   traMADol 50 MG tablet Commonly known as: ULTRAM Take 50 mg by mouth every 6 (six) hours as needed.       Allergies:  Allergies  Allergen Reactions  . Nitroglycerin Nausea And Vomiting  . Ampicillin Other (See Comments)    Per allergy test    Past Medical History, Surgical history, Social history, and Family History were reviewed and  updated.  Review of Systems: Review of Systems  HENT: Negative.   Eyes: Negative.   Respiratory: Positive for shortness of breath.   Cardiovascular: Negative.   Gastrointestinal: Positive for abdominal pain.  Genitourinary: Negative.   Musculoskeletal: Positive for back pain.  Skin: Negative.   Neurological: Negative.   Endo/Heme/Allergies: Negative.   Psychiatric/Behavioral: Negative.      Physical Exam:  weight is 179 lb (81.2 kg). Her oral temperature is 98.2 F (36.8 C). Her blood pressure is 135/73 and her pulse is 85. Her respiration is 16 and oxygen saturation is 97%.   Wt Readings from Last 3 Encounters:  01/14/21 179 lb (81.2 kg)  01/03/21 174 lb 9.6 oz (79.2 kg)  12/28/20 172 lb 14.4 oz (78.4 kg)    Physical Exam Vitals reviewed.  HENT:     Head: Normocephalic and atraumatic.  Eyes:     Pupils: Pupils are equal, round, and reactive to light.  Cardiovascular:     Rate and Rhythm: Normal rate and regular rhythm.     Heart sounds: Normal heart sounds.  Pulmonary:     Effort: Pulmonary effort is normal.     Breath sounds: Normal breath sounds.  Abdominal:     General: Bowel sounds are normal.     Palpations: Abdomen is soft.     Comments: Her abdomen is slightly distended.  There is no obvious fluid wave.  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  She still has a dressing over the abdominal laparotomy scar.  She does not have any obvious erythema or warmth.  There is no exudate coming from the scars.  She has decent bowel sounds.  There is no palpable liver or spleen tip.  Musculoskeletal:        General: No tenderness or deformity. Normal range of motion.     Cervical back: Normal range of motion.  Lymphadenopathy:     Cervical: No cervical adenopathy.  Skin:    General: Skin is warm and dry.     Findings: No erythema or rash.  Neurological:     Mental Status: She is alert and oriented to person, place, and time.  Psychiatric:        Behavior:  Behavior normal.        Thought Content: Thought content normal.        Judgment: Judgment normal.      Lab Results  Component Value Date   WBC 7.8 01/14/2021   HGB 11.7 (L) 01/14/2021   HCT 35.8 (L) 01/14/2021   MCV 98.1 01/14/2021   PLT 230 01/14/2021   Lab Results  Component Value Date   FERRITIN 96 07/04/2020   IRON 55 07/04/2020   TIBC 335 07/04/2020   UIBC 280 07/04/2020  IRONPCTSAT 17 (L) 07/04/2020   Lab Results  Component Value Date   RETICCTPCT 1.9 01/05/2020   RBC 3.65 (L) 01/14/2021   No results found for: KPAFRELGTCHN, LAMBDASER, KAPLAMBRATIO No results found for: IGGSERUM, IGA, IGMSERUM No results found for: Odetta Pink, SPEI   Chemistry      Component Value Date/Time   NA 138 01/14/2021 0954   NA 139 07/12/2019 1110   K 4.0 01/14/2021 0954   CL 101 01/14/2021 0954   CO2 26 01/14/2021 0954   BUN 19 01/14/2021 0954   BUN 15 07/12/2019 1110   CREATININE 0.71 01/14/2021 0954   CREATININE 0.74 01/08/2017 1100      Component Value Date/Time   CALCIUM 9.6 01/14/2021 0954   ALKPHOS 37 (L) 01/14/2021 0954   AST 22 01/14/2021 0954   ALT 19 01/14/2021 0954   BILITOT 0.3 01/14/2021 0954       Impression and Plan: Ms. 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   I really hope that the colostomy can be reversed.  It sounds like this might be in June.  I am sure the surgeons will set this follow-up.  From my point of view, there is really not much that we need to do.  I just hope that she is feeling little bit better when we see her back after having the colostomy reversed..  I do not see that we have to do any scans on her probably until the Fall.     Volanda Napoleon, MD 4/25/202211:38 AM

## 2021-01-15 ENCOUNTER — Telehealth: Payer: Self-pay

## 2021-01-15 NOTE — Telephone Encounter (Signed)
pts niece aware of f/u appts for July   Bailey Walker

## 2021-02-04 ENCOUNTER — Encounter: Payer: Self-pay | Admitting: Nurse Practitioner

## 2021-02-04 ENCOUNTER — Ambulatory Visit: Payer: Medicaid Other | Admitting: Nurse Practitioner

## 2021-02-04 ENCOUNTER — Other Ambulatory Visit: Payer: Self-pay

## 2021-02-04 VITALS — BP 130/76 | HR 104 | Ht 58.27 in | Wt 182.1 lb

## 2021-02-04 DIAGNOSIS — K219 Gastro-esophageal reflux disease without esophagitis: Secondary | ICD-10-CM | POA: Diagnosis not present

## 2021-02-04 DIAGNOSIS — Z85038 Personal history of other malignant neoplasm of large intestine: Secondary | ICD-10-CM

## 2021-02-04 MED ORDER — NA SULFATE-K SULFATE-MG SULF 17.5-3.13-1.6 GM/177ML PO SOLN: 1.0000 | Freq: Once | ORAL | 0 refills | Status: AC

## 2021-02-04 NOTE — Progress Notes (Signed)
ASSESSMENT AND PLAN    # 75 yo female with Stage IIIB ( [id number]) sigmoid colon adenocarcinoma diagnosed by [place] in July 2021. She is s/p sigmoid colectomy / end ileostomy followed by chemotherapy (completed late January 2022). General Surgery ( [facility]) planning for colostomy reversal but requesting colonoscopy as patient has never had one. Only a flexible sigmoidoscopy was done at time of colon cancer diagnosis due to constipation related to the obstructing colon esion.  --Patient will be scheduled for colonoscopy ( via colostomy) with Dr. Havery Moros. I discussed case with him today and no need for enema, he will irrigate / lavage rectal pouch. The risks and benefits of colonoscopy with possible polypectomy / biopsies were discussed and the patient agrees to proceed.   # Generalized lower abdominal pain since colon resection.  Pain exacerbated by bending/twisting but muscle relaxers provides no relief.  Etiology of pain unclear . Follow-up CT scan 11/13/2020 after completion of chemotherapy demonstrates an enlarging parastomal hernia containing multiple loops of mid to distal small bowel without evidence of incarceration or obstruction.  The colon does not appear to be involved so hopefully findings will not interfere with colonoscopy  # Chronic GERD with heartburn.  Possible esophagitis noted on coronary CT in April 2022 but she had a normal appearing esophagus on EGD in July 2021.   --Patient previously treated with Dexilant.  In April Cardiology changed her to Protonix but patient hasn't yet switched.  # Chronic gastritis, non H.pylori related. No intestinal metaplasia or dysplasia  # Pernicious anemia followed by Hematology  HISTORY OF PRESENT ILLNESS     Chief Complaint : discuss having a colonoscopy   Bailey Walker is a 75 y.o. female with a past medical history significant for GERD,  HTN, hypothyroidism, DM, pernicious anemia,  Stage IIIB ( [id number]) sigmoid colon adenocarcinoma s/p  sigmoid colectomy / end ileostomy and chemotherapy completed late January 2022,  appendectomy. See additional PMH below.   Patient is from [place], [place] speak [place]. Her niece is here to translate. She is known to Dr. Havery Moros ( seen in office in 2018 for evaluation of chest pain). At that time she was concurrently undergoing workup by Cardiology which turned out to be unrevealing  INTERVAL HISTORY:   Patient now referred by [facility], Dr Johney Maine for a colonoscopy.   Bailey Walker hospitalized late July 2021 for vomiting and weight loss.  CT scan of the abdomen and pelvis was concerning for sigmoid mass .  Inpatient EGD and flexible sigmoidoscopy ( Dr. Bryan Lemma) showed a normal esophagus and gastritis. Two gastric polyps were biopsied. On lower endoscopiy there was  solid stool throughout the colon but a fungating, completely obstructing  mass was found in the sigmoid colon. Gastric biopsies compatible with chronic gastritis with erosions, no H. pylori .  No intestinal metaplasia nor dysplasia.  Gastric polyps were hyperplastic.    COLON, SIGMOID, BIOPSY:  - Adenocarcinoma with mucinous features  On 04/20/2020 patient underwent a sigmoid colectomy and end colostomy by Dr. Ninfa Linden.  She went back to the OR on 05/01/2020 for treatment of wound dehiscence and an abdominal wall abscess .   COLON, SIGMOID, RESECTION:  - Invasive adenocarcinoma with extracellular mucin, moderately differentiated, spanning 4.9 cm.  - Tumor involves serosa.  - Resection margins are negative.  - Metastatic carcinoma in one of fifteen lymph nodes (1/15).  - Satellite nodule (x1).  - Calcified serosal nodule.  - See oncology table  Patient completed chemotherapy with Dr.  Ennever  in late January 2022. She is scheduled to see [facility] mid June regarding colostomy reversal  Chronic chest pain; Bailey Walker has chronic intermittent chest pain.  She tells me the pain is generally related to exertion.  She also has mild  shortness of breath with exertion. She was hospitalized in April 2022. No ischemic changes on EKG, high sensitivty troponins were negative. D-dimer negative. She was evaluated by Cardiology and ruled out for ACS. Coronary CTA showed mild CAD. ASA and statin recommended. Patient has since had outpatient cardiac follow up for pre-op cardiac clearance and felt to be an acceptable surgical risk.  .  . .   Patient has chronic GERD. Cardiology recently changed her from [place] to protonix for breakthrough heartburn but patient hasn't yet made the change.  She sometimes finds it hard to differentiate heartburn from chest pain  Patient complains of generalized lower abdominal pain present since surgery colon resection. Pain not meal related. .  The pain is exacerbated by bending/twisting. She has been prescribed miuscle relaxers but they do not help.  She reports good colostomy output.  No blood in stool  Data Reviewed:   12/28/20 Echo ventricular ejection fraction, by estimation, is 55 to 60%. The left ventricle has normal function. The left ventricle has no regional wall motion abnormalities. Left ventricular diastolic parameters were normal. 2. Right ventricular systolic function is normal. The right ventricular size is normal. 3. The mitral valve is normal in structure. Trivial mitral valve regurgitation. No evidence of mitral stenosis. 4. The aortic valve is tricuspid. There is moderate calcification of the aortic valve. Aortic valve regurgitation is not visualized. Mild to moderate aortic valve sclerosis/calcification is present, without any evidence of aortic stenosis. 5. The inferior vena cava is normal in size with greater than 50% respiratory variability, suggesting right atrial pressure of 3 mmHg.  01/15/21 labs Liver chemistries normal.  Hgb 11.7  Past Medical History:  Diagnosis Date  . 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 ([place]) 04/26/2020  . Diabetes mellitus  without complication ([place])   . Goals of care, counseling/discussion 04/26/2020  . Hypertension   . SVT (supraventricular tachycardia) ([place])   . Thyroid disease      Past Surgical History:  Procedure Laterality Date  . APPENDECTOMY    . BIOPSY  04/18/2020   Procedure: BIOPSY;  Surgeon: Lavena Bullion, DO;  Location: [facility];  Service: Gastroenterology;;  . BIOPSY  04/19/2020   Procedure: BIOPSY;  Surgeon: Lavena Bullion, DO;  Location: [facility];  Service: Gastroenterology;;  . BREAST SURGERY     Fatty tissue on biopsy  . ESOPHAGOGASTRODUODENOSCOPY (EGD) WITH PROPOFOL N/A 04/18/2020   Procedure: ESOPHAGOGASTRODUODENOSCOPY (EGD) WITH PROPOFOL;  Surgeon: Lavena Bullion, DO;  Location: [facility];  Service: Gastroenterology;  Laterality: N/A;  . EYE SURGERY Bilateral    april and march 2019 for cataracts.   . fatty gland     left wrist , right breast  . FLEXIBLE SIGMOIDOSCOPY N/A 04/18/2020   Procedure: FLEXIBLE SIGMOIDOSCOPY;  Surgeon: Lavena Bullion, DO;  Location: [facility];  Service: Gastroenterology;  Laterality: N/A;  . FLEXIBLE SIGMOIDOSCOPY N/A 04/19/2020   Procedure: FLEXIBLE SIGMOIDOSCOPY;  Surgeon: Lavena Bullion, DO;  Location: [facility];  Service: Gastroenterology;  Laterality: N/A;  . INCISION AND DRAINAGE OF WOUND N/A 05/01/2020   Procedure: ABDOMINAL  WOUND EXPLORATION; IRRIGATION AND DEBRIDEMENT WOUND;  Surgeon: Rolm Bookbinder, MD;  Location: [facility];  Service: General;  Laterality: N/A;  .  IR IMAGING GUIDED PORT INSERTION  06/13/2020  . KNEE SURGERY     left knee  . LAPAROTOMY N/A 04/20/2020   Procedure: SIGMOID COLECTOMY AND COLOSTOMY;  Surgeon: Coralie Keens, MD;  Location: [facility];  Service: General;  Laterality: N/A;  . SUBMUCOSAL TATTOO INJECTION  04/19/2020   Procedure: SUBMUCOSAL TATTOO INJECTION;  Surgeon: Lavena Bullion, DO;  Location: [facility];  Service: Gastroenterology;;   Family History  Problem Relation Age of  Onset  . Breast cancer Cousin        mat and pat sides  . Cancer Sister        cancer everywhere  . Heart disease Brother   . Colon cancer Neg Hx   . Esophageal cancer Neg Hx   . Rectal cancer Neg Hx    Social History   Tobacco Use  . Smoking status: Former Smoker    Quit date: 12/29/2013    Years since quitting: 7.1  . Smokeless tobacco: Never Used  Vaping Use  . Vaping Use: Never used  Substance Use Topics  . Alcohol use: No  . Drug use: No   Current Outpatient Medications  Medication Sig Dispense Refill  . aspirin EC 81 MG tablet Take 81 mg by mouth daily. Swallow whole.    Marland Kitchen atorvastatin (LIPITOR) 10 MG tablet Take 2 tablets (20 mg total) by mouth daily. 180 tablet 0  . feeding supplement, ENSURE ENLIVE, (ENSURE ENLIVE) LIQD Take 237 mLs by mouth 3 (three) times daily between meals. 237 mL 12  . glipiZIDE (GLUCOTROL) 5 MG tablet TAKE 1 TABLET BY MOUTH TWICE A DAY BEFORE MEALS (Patient taking differently: Take 5 mg by mouth See admin instructions. Take one tablet (5 mg) by mouth twice daily - after lunch and at midnight) 60 tablet 3  . levothyroxine (SYNTHROID) 75 MCG tablet TAKE 1 TABLET BY MOUTH EVERY DAY (Patient taking differently: Take 75 mcg by mouth daily before breakfast.) 30 tablet 0  . lidocaine-prilocaine (EMLA) cream Apply to affected area once 30 g 3  . metFORMIN (GLUCOPHAGE) 500 MG tablet Take 0.5 tablets (250 mg total) by mouth 2 (two) times daily with a meal. (Patient taking differently: Take 500 mg by mouth See admin instructions. Take one tablet (500 mg) by mouth twice daily - after lunch and at midnight) 90 tablet 3  . methocarbamol (ROBAXIN) 500 MG tablet TAKE 1 TABLET BY MOUTH EVERY 12 HOURS AS NEEDED FOR MUSCLE SPASMS. 60 tablet 2  . metoprolol tartrate (LOPRESSOR) 25 MG tablet Take 1 tablet (25 mg total) by mouth 2 (two) times daily. 180 tablet 3  . Multiple Vitamin (MULTIVITAMIN WITH MINERALS) TABS tablet Take 1 tablet by mouth daily. 30 tablet 0  . NON  FORMULARY [facility] Creams-#11    . prednisoLONE acetate (PRED FORTE) 1 % ophthalmic suspension Place 1 drop into both eyes See admin instructions. Instill one drop into both eyes on Monday, Wednesday, Friday nights    . pantoprazole (PROTONIX) 20 MG tablet Take 1 tablet (20 mg total) by mouth daily. (Patient not taking: Reported on 02/04/2021) 30 tablet 13   No current facility-administered medications for this visit.   Facility-Administered Medications Ordered in Other Visits  Medication Dose Route Frequency Provider Last Rate Last Admin  . 0.9 %  sodium chloride infusion   Intravenous Once Volanda Napoleon, MD       Allergies  Allergen Reactions  . Nitroglycerin Nausea And Vomiting  . Ampicillin Other (See Comments)    Per  allergy test     Review of Systems:  All systems reviewed and negative except where noted in HPI.    PHYSICAL EXAM :    Wt Readings from Last 3 Encounters:  02/04/21 182 lb 2 oz (82.6 kg)  01/14/21 179 lb (81.2 kg)  01/03/21 174 lb 9.6 oz (79.2 kg)    BP 130/76 (BP Location: Left Arm, Patient Position: Sitting, Cuff Size: Normal)   Pulse (!) 104   Ht 4' 10.27" (1.48 m) Comment: height measured without shoes  Wt 182 lb 2 oz (82.6 kg)   BMI 37.71 kg/m  Constitutional:  Pleasant female in no acute distress. Psychiatric: Normal mood and affect. Behavior is normal. EENT: Pupils normal.  Conjunctivae are normal. No scleral icterus. Neck supple.  Cardiovascular: Normal rate, regular rhythm. No edema Pulmonary/chest: Effort normal and breath sounds normal. No wheezing, rales or rhonchi. Abdominal: limited examination as patient uncomfortable getting on exam table.  Abdomen is soft, seems mildly distended.  Mild generalized lower abdominal tenderness. Bowel sounds active throughout.  Neurological: Alert and oriented. Skin: Skin is warm and dry. No rashes noted.  Tye Savoy, NP  02/04/2021, 2:25 PM  Cc:  Referring Provider Michael Boston,  MD

## 2021-02-04 NOTE — Patient Instructions (Signed)
If you are age 77 or older, your body mass index should be between 23-30. Your Body mass index is 37.71 kg/m. If this is out of the aforementioned range listed, please consider follow up with your Primary Care Provider.  If you are age 84 or younger, your body mass index should be between 19-25. Your Body mass index is 37.71 kg/m. If this is out of the aformentioned range listed, please consider follow up with your Primary Care Provider.   You have been scheduled for a colonoscopy. Please follow written instructions given to you at your visit today.  Please pick up your prep supplies at the pharmacy within the next 1-3 days. If you use inhalers (even only as needed), please bring them with you on the day of your procedure.  Due to recent changes in healthcare laws, you may see the results of your imaging and laboratory studies on MyChart before your provider has had a chance to review them.  We understand that in some cases there may be results that are confusing or concerning to you. Not all laboratory results come back in the same time frame and the provider may be waiting for multiple results in order to interpret others.  Please give [place] 48 hours in order for your provider to thoroughly review all the results before contacting the office for clarification of your results.

## 2021-02-04 NOTE — Progress Notes (Incomplete)
ASSESSMENT AND PLAN    Colon Bailey --two day bowel prep even though she has a colostomy Peristomal hernia HISTORY OF PRESENT ILLNESS     Chief Complaint : discuss having a colonoscopy   Bailey Walker is a 75 y.o. 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 See additional PMH below.   Patient is known to Bailey Walker ( seen in office in 2018 for evaluation of chest pain). She was concurrently undergoing workup by Cardiology. . new to the practice, referred by [facility], Bailey Walker for colonoscopy. She is from [place], [place] speak [place]. Her niece is here to translate.   Patient hospitalized late July 2021 for vomiting and weight loss.  CT scan of the abdomen and pelvis was concerning for sigmoid mass .  Inpatient EGD showed a normal esophagus, gastritis, 2 gastric polyps were biopsied. Lower endoscopy aborted due to excessive amount of stool in the colon.  Gastric biopsies compatible with chronic gastritis with erosions, no Bailey Walker .  No intestinal metaplasia nor dysplasia.  Gastric polyps were hyperplastic.  Lower endoscopy reattempted the following day. There was still solid stool throughout the colon but a fungating, completely obstructing  mass was found in the sigmoid colon.    COLON, SIGMOID, BIOPSY:  - Adenocarcinoma with mucinous features  On 04/20/2020 patient underwent a sigmoid colectomy and end colostomy by Bailey. Ninfa Walker.  She went back to the OR on 05/01/2020 for treatment of wound dehiscence and abdominal wall abscess .   COLON, SIGMOID, RESECTION:  - Invasive adenocarcinoma with extracellular mucin, moderately differentiated, spanning 4.9 cm.  - Tumor involves serosa.  - Resection margins are negative.  - Metastatic carcinoma in one of fifteen lymph nodes  (1/15).  - Satellite nodule (x1).  - Calcified serosal nodule.  - See oncology table  Patient underwent chemotherapy with Bailey. Marin Olp., finishing up in late January 2022. She is scheduled to see [facility] mid June regarding colostomy reversal  Bailey Walker has chronic intermittent chest pain (we saw her in the office in 2018 for it). She was hospitalized in April for chest pain and SOB. No ischemic changes in EKG, high sensitivty troponins were negative. D-dimer negative. She was evaluated by Cardiology, ACS ruled out.  Coronary CTA showed mild CAD, ASA and statin recommended. Chest pain resolved. Patient has since had outpatient cardiac visit for pre-op cardiac evaluation and felt to be an acceptable surgical risk.  .  . . Patient has chronic chest pain, mainly with exertional. She also has chronic SOB with mid exertional. She had a cardiac appointment in [place] April for clearance for [place] colostomy reversal. Cardiology recently changed her from Bailey Walker to Bailey Walker for breakthrough heartburn but she hasn't yet made the change.   Patient has generalized lower bdominal pain since surgery. Pain not meal related. . She has been prescribed miuscle relazers but they didn't help. Pain worse with bending / twisting  Data Reviewed:  01/15/21 Liver chemistries normal.  Hgb 11.7  PREVIOUS EVALUATIONS:       Past Medical History:  Diagnosis Date  . Bailey of sigmoid colon metastatic to intra-abdominal lymph node ([place]) 04/26/2020  . Diabetes mellitus without complication ([place])   . Goals of care, counseling/discussion 04/26/2020  . Hypertension   . SVT (supraventricular tachycardia) ([place])   . Thyroid disease  Past Surgical History:  Procedure Laterality Date  . APPENDECTOMY    . BIOPSY  04/18/2020   Procedure: BIOPSY;  Surgeon: Bailey Bullion, DO;  Location: [facility];  Service: Gastroenterology;;  . BIOPSY  04/19/2020   Procedure: BIOPSY;  Surgeon: Bailey Bullion, DO;  Location: [facility];  Service: Gastroenterology;;  . BREAST SURGERY     Fatty tissue on biopsy  . ESOPHAGOGASTRODUODENOSCOPY (EGD) WITH PROPOFOL N/A 04/18/2020   Procedure: ESOPHAGOGASTRODUODENOSCOPY (EGD) WITH PROPOFOL;  Surgeon: Bailey Bullion, DO;  Location: [facility];  Service: Gastroenterology;  Laterality: N/A;  . EYE SURGERY Bilateral    april and march 2019 for cataracts.   . fatty gland     left wrist , right breast  . FLEXIBLE SIGMOIDOSCOPY N/A 04/18/2020   Procedure: FLEXIBLE SIGMOIDOSCOPY;  Surgeon: Bailey Bullion, DO;  Location: [facility];  Service: Gastroenterology;  Laterality: N/A;  . FLEXIBLE SIGMOIDOSCOPY N/A 04/19/2020   Procedure: FLEXIBLE SIGMOIDOSCOPY;  Surgeon: Bailey Bullion, DO;  Location: [facility];  Service: Gastroenterology;  Laterality: N/A;  . INCISION AND DRAINAGE OF WOUND N/A 05/01/2020   Procedure: ABDOMINAL  WOUND EXPLORATION; IRRIGATION AND DEBRIDEMENT WOUND;  Surgeon: Bailey Bookbinder, MD;  Location: [facility];  Service: General;  Laterality: N/A;  . IR IMAGING GUIDED PORT INSERTION  06/13/2020  . KNEE SURGERY     left knee  . LAPAROTOMY N/A 04/20/2020   Procedure: SIGMOID COLECTOMY AND COLOSTOMY;  Surgeon: Bailey Keens, MD;  Location: [facility];  Service: General;  Laterality: N/A;  . SUBMUCOSAL TATTOO INJECTION  04/19/2020   Procedure: SUBMUCOSAL TATTOO INJECTION;  Surgeon: Bailey Bullion, DO;  Location: [facility];  Service: Gastroenterology;;   Family History  Problem Relation Age of Onset  . Breast Bailey Cousin        mat and pat sides  . Bailey Sister        Bailey everywhere  . Heart disease Brother   . Colon Bailey Neg Hx   . Esophageal Bailey Neg Hx   . Rectal Bailey Neg Hx    Social History   Tobacco Use  . Smoking status: Former Smoker    Quit date: 12/29/2013    Years since quitting: 7.1  . Smokeless tobacco: Never Used  Vaping Use  . Vaping Use: Never used  Substance Use Topics  . Alcohol use: No  . Drug use: No    Current Outpatient Medications  Medication Sig Dispense Refill  . aspirin EC 81 MG tablet Take 81 mg by mouth daily. Swallow whole.    Marland Kitchen atorvastatin (LIPITOR) 10 MG tablet Take 2 tablets (20 mg total) by mouth daily. 180 tablet 0  . feeding supplement, ENSURE ENLIVE, (ENSURE ENLIVE) LIQD Take 237 mLs by mouth 3 (three) times daily between meals. 237 mL 12  . glipiZIDE (GLUCOTROL) 5 MG tablet TAKE 1 TABLET BY MOUTH TWICE A DAY BEFORE MEALS (Patient taking differently: Take 5 mg by mouth See admin instructions. Take one tablet (5 mg) by mouth twice daily - after lunch and at midnight) 60 tablet 3  . levothyroxine (SYNTHROID) 75 MCG tablet TAKE 1 TABLET BY MOUTH EVERY DAY (Patient taking differently: Take 75 mcg by mouth daily before breakfast.) 30 tablet 0  . lidocaine-prilocaine (EMLA) cream Apply to affected area once 30 g 3  . metFORMIN (GLUCOPHAGE) 500 MG tablet Take 0.5 tablets (250 mg total) by mouth 2 (two) times daily with a meal. (Patient taking differently: Take 500 mg by mouth See admin  instructions. Take one tablet (500 mg) by mouth twice daily - after lunch and at midnight) 90 tablet 3  . methocarbamol (ROBAXIN) 500 MG tablet TAKE 1 TABLET BY MOUTH EVERY 12 HOURS AS NEEDED FOR MUSCLE SPASMS. 60 tablet 2  . metoprolol tartrate (LOPRESSOR) 25 MG tablet Take 1 tablet (25 mg total) by mouth 2 (two) times daily. 180 tablet 3  . Multiple Vitamin (MULTIVITAMIN WITH MINERALS) TABS tablet Take 1 tablet by mouth daily. 30 tablet 0  . NON FORMULARY [facility] C[facility]-#11    . prednisoLONE acetate (PRED FORTE) 1 % ophthalmic suspension Place 1 drop into both eyes See admin instructions. Instill one drop into both eyes on Monday, Wednesday, Friday nights    . pantoprazole (Bailey Walker) 20 MG tablet Take 1 tablet (20 mg total) by mouth daily. (Patient not taking: Reported on 02/04/2021) 30 tablet 13   No current facility-administered medications for this visit.   Facility-Administered  Medications Ordered in Other Visits  Medication Dose Route Frequency Provider Last Rate Last Admin  . 0.9 %  sodium chloride infusion   Intravenous Once Volanda Napoleon, MD       Allergies  Allergen Reactions  . Nitroglycerin Nausea And Vomiting  . Ampicillin Other (See Comments)    Per allergy test     Review of Systems: Positive for ***.  All other systems reviewed and negative except where noted in HPI.    PHYSICAL EXAM :    Wt Readings from Last 3 Encounters:  02/04/21 182 lb 2 oz (82.6 kg)  01/14/21 179 lb (81.2 kg)  01/03/21 174 lb 9.6 oz (79.2 kg)    BP 130/76 (BP Location: Left Arm, Patient Position: Sitting, Cuff Size: Normal)   Pulse (!) 104   Ht 4' 10.27" (1.48 m) Comment: height measured without shoes  Wt 182 lb 2 oz (82.6 kg)   BMI 37.71 kg/m  Constitutional:  Pleasant ***female in no acute distress. Psychiatric: Normal mood and affect. Behavior is normal. EENT: Pupils normal.  Conjunctivae are normal. No scleral icterus. Neck supple.  Cardiovascular: Normal rate, regular rhythm. No edema Pulmonary/chest: Effort normal and breath sounds normal. No wheezing, rales or rhonchi. Abdominal: Soft, nondistended, nontender. Bowel sounds active throughout. There are no masses palpable. No hepatomegaly. Neurological: Alert and oriented to person place and time. Skin: Skin is warm and dry. No rashes noted.  Tye Savoy, NP  02/04/2021, 2:25 PM  Cc:  Referring Provider [name], MD

## 2021-02-05 ENCOUNTER — Encounter: Payer: Self-pay | Admitting: Nurse Practitioner

## 2021-02-05 NOTE — Progress Notes (Signed)
Agree with assessment and plan as outlined.  

## 2021-02-11 ENCOUNTER — Other Ambulatory Visit: Payer: Self-pay | Admitting: Family Medicine

## 2021-02-11 DIAGNOSIS — Z1231 Encounter for screening mammogram for malignant neoplasm of breast: Secondary | ICD-10-CM

## 2021-02-13 ENCOUNTER — Inpatient Hospital Stay: Payer: Medicaid Other

## 2021-02-13 ENCOUNTER — Inpatient Hospital Stay: Payer: Medicaid Other | Attending: Hematology and Oncology

## 2021-02-13 ENCOUNTER — Ambulatory Visit (HOSPITAL_BASED_OUTPATIENT_CLINIC_OR_DEPARTMENT_OTHER)
Admission: RE | Admit: 2021-02-13 | Discharge: 2021-02-13 | Disposition: A | Discharge status: Home / Self Care | Payer: Medicaid Other | Source: Ambulatory Visit | Attending: Hematology & Oncology | Admitting: Hematology & Oncology

## 2021-02-13 ENCOUNTER — Other Ambulatory Visit: Payer: Self-pay

## 2021-02-13 ENCOUNTER — Telehealth: Payer: Self-pay

## 2021-02-13 DIAGNOSIS — Z7982 Long term (current) use of aspirin: Secondary | ICD-10-CM | POA: Insufficient documentation

## 2021-02-13 DIAGNOSIS — C187 Malignant neoplasm of sigmoid colon: Secondary | ICD-10-CM

## 2021-02-13 DIAGNOSIS — Z85038 Personal history of other malignant neoplasm of large intestine: Secondary | ICD-10-CM | POA: Insufficient documentation

## 2021-02-13 DIAGNOSIS — D510 Vitamin B12 deficiency anemia due to intrinsic factor deficiency: Secondary | ICD-10-CM | POA: Insufficient documentation

## 2021-02-13 DIAGNOSIS — C772 Secondary and unspecified malignant neoplasm of intra-abdominal lymph nodes: Secondary | ICD-10-CM

## 2021-02-13 DIAGNOSIS — Z79899 Other long term (current) drug therapy: Secondary | ICD-10-CM | POA: Diagnosis not present

## 2021-02-13 DIAGNOSIS — Z95828 Presence of other vascular implants and grafts: Secondary | ICD-10-CM

## 2021-02-13 LAB — CBC WITH DIFFERENTIAL (CANCER CENTER ONLY)
Abs Immature Granulocytes: 0.06 10*3/uL (ref 0.00–0.07)
Basophils Absolute: 0.1 10*3/uL (ref 0.0–0.1)
Basophils Relative: 1 %
Eosinophils Absolute: 0.2 10*3/uL (ref 0.0–0.5)
Eosinophils Relative: 2 %
HCT: 36.4 % (ref 36.0–46.0)
Hemoglobin: 12 g/dL (ref 12.0–15.0)
Immature Granulocytes: 1 %
Lymphocytes Relative: 25 %
Lymphs Abs: 2.4 10*3/uL (ref 0.7–4.0)
MCH: 31.4 pg (ref 26.0–34.0)
MCHC: 33 g/dL (ref 30.0–36.0)
MCV: 95.3 fL (ref 80.0–100.0)
Monocytes Absolute: 0.8 10*3/uL (ref 0.1–1.0)
Monocytes Relative: 8 %
Neutro Abs: 6.1 10*3/uL (ref 1.7–7.7)
Neutrophils Relative %: 63 %
Platelet Count: 231 10*3/uL (ref 150–400)
RBC: 3.82 MIL/uL — ABNORMAL LOW (ref 3.87–5.11)
RDW: 12.4 % (ref 11.5–15.5)
WBC Count: 9.4 10*3/uL (ref 4.0–10.5)
nRBC: 0 % (ref 0.0–0.2)

## 2021-02-13 LAB — CMP (CANCER CENTER ONLY)
ALT: 25 U/L (ref 0–44)
AST: 25 U/L (ref 15–41)
Albumin: 4.2 g/dL (ref 3.5–5.0)
Alkaline Phosphatase: 40 U/L (ref 38–126)
Anion gap: 10 (ref 5–15)
BUN: 17 mg/dL (ref 8–23)
CO2: 26 mmol/L (ref 22–32)
Calcium: 9.9 mg/dL (ref 8.9–10.3)
Chloride: 99 mmol/L (ref 98–111)
Creatinine: 0.67 mg/dL (ref 0.44–1.00)
GFR, Estimated: >60 mL/min (ref >60)
Glucose, Bld: 192 mg/dL — ABNORMAL HIGH (ref 70–99)
Potassium: 3.8 mmol/L (ref 3.5–5.1)
Sodium: 135 mmol/L (ref 135–145)
Total Bilirubin: 0.4 mg/dL (ref 0.3–1.2)
Total Protein: 7.2 g/dL (ref 6.5–8.1)

## 2021-02-13 MED ORDER — SODIUM CHLORIDE 0.9% FLUSH
10.0000 mL | Freq: Once | INTRAVENOUS | Status: AC
  Administered 2021-02-13: 10 mL via INTRAVENOUS
  Filled 2021-02-13: qty 10

## 2021-02-13 MED ORDER — HEPARIN SOD (PORK) LOCK FLUSH 100 UNIT/ML IV SOLN
500.0000 [IU] | Freq: Once | INTRAVENOUS | Status: AC
  Administered 2021-02-13: 500 [IU] via INTRAVENOUS
  Filled 2021-02-13: qty 5

## 2021-02-13 NOTE — Patient Instructions (Signed)
Tunneled Central Venous Catheter Flushing Guide  It is important to flush your tunneled central venous catheter each time you use it, both before and after you use it. Flushing your catheter will help prevent it from clogging. What are the risks? Risks may include:  Infection.  Air getting into the catheter and bloodstream. Supplies needed:  A clean pair of gloves.  A disinfecting wipe. Use an alcohol wipe, chlorhexidine wipe, or iodine wipe as told by your health care provider.  A 10 mL syringe that has been prefilled with saline solution.  An empty 10 mL syringe, if a substance called heparin was injected into your catheter. How to flush your catheter When you flush your catheter, make sure you follow any specific instructions from your health care provider or the manufacturer. These are general guidelines. Flushing your catheter before use If there is heparin in your catheter: 1. Wash your hands with soap and water. 2. Put on gloves. 3. Scrub the injection cap for a minimum of 15 seconds with a disinfecting wipe. 4. Unclamp the catheter. 5. Attach the empty syringe to the injection cap. 6. Pull the syringe plunger back and withdraw 10 mL of blood. 7. Place the syringe into an appropriate waste container. 8. Scrub the injection cap for 15 seconds with a disinfecting wipe. 9. Attach the prefilled syringe to the injection cap. 10. Flush the catheter by pushing the plunger forward until all the liquid from the syringe is in the catheter. 11. Remove the syringe from the injection cap. 12. Clamp the catheter. If there is no heparin in your catheter: 1. Wash your hands with soap and water. 2. Put on gloves. 3. Scrub the injection cap for 15 seconds with a disinfecting wipe. 4. Unclamp the catheter. 5. Attach the prefilled syringe to the injection cap. 6. Flush the catheter by pushing the plunger forward until 5 mL of the liquid from the syringe is in the catheter. 7. Pull back on  the syringe until you see blood in the catheter. 8. If you have been asked to collect any blood, follow your health care provider's instructions. Otherwise, flush the catheter with the rest of the solution from the syringe. 9. Remove the syringe from the injection cap. 10. Clamp the catheter.   Flushing your catheter after use 1. Wash your hands with soap and water. 2. Put on gloves. 3. Scrub the injection cap for 15 seconds with a disinfecting wipe. 4. Unclamp the catheter. 5. Attach the prefilled syringe to the injection cap. 6. Flush the catheter by pushing the plunger forward until all of the liquid from the syringe is in the catheter. 7. Remove the syringe from the injection cap. 8. Clamp the catheter. Problems and solutions  If blood cannot be completely cleared from the injection cap, you may need to have the injection cap replaced.  If the catheter is difficult to flush, use the pulsing method. The pulsing method involves pushing only a few milliliters of solution into the catheter at a time and pausing between pushes.  If you do not see blood in the catheter when you pull back on the syringe, change your body position, such as by raising your arms above your head. Take a deep breath and cough. Then, pull back on the syringe. If you still do not see blood, flush the catheter with a small amount of solution. Then, change positions again and take a breath or cough. Pull back on the syringe again. If you still do not   see blood, finish flushing the catheter and contact your health care provider. Do not use your catheter until your health care provider says it is okay. General tips  Have someone help you flush your catheter, if possible.  Do not force fluid through your catheter.  Do not use a syringe that is larger or smaller than 10 mL. Using a smaller syringe can make the catheter burst.  Do not use your catheter without flushing it first if it has heparin in it. Contact a health  care provider if:  You cannot see any blood in the catheter when you flush it before using it.  Your catheter is difficult to flush. Get help right away if:  You cannot flush the catheter.  The catheter leaks when you flush it or when there is fluid in it.  There are cracks or breaks in the catheter. Summary  It is important to flush your tunneled central venous catheter each time you use it, both before and after you use it.  Scrub the injection cap for 15 seconds with a disinfecting wipe before and after you flush it.  When you flush your catheter, make sure you follow any specific instructions from your health care provider or the manufacturer.  Get help right away if you cannot flush the catheter. This information is not intended to replace advice given to you by your health care provider. Make sure you discuss any questions you have with your health care provider. Document Revised: 11/17/2019 Document Reviewed: 11/24/2018 Elsevier Patient Education  2021 Elsevier Inc.  

## 2021-02-13 NOTE — Telephone Encounter (Signed)
Received VM from pt's niece [name] reporting pt is having rt breast pain so severe it is making her short of breath. [name] states the pain is the cause of the SOB and does not believe she independently has trouble breathing. The pain has been constant for a couple days and no position change or intervention seems to change it. denies cough or fever. Attempted to schedule a mammogram but was given an appt for 7/20.   Per Dr Marin Olp, pt scheduled for lab/CXR today.  [name] aware and expects a call with results. dph

## 2021-02-14 ENCOUNTER — Ambulatory Visit (HOSPITAL_BASED_OUTPATIENT_CLINIC_OR_DEPARTMENT_OTHER)
Admission: RE | Admit: 2021-02-14 | Discharge: 2021-02-14 | Disposition: A | Discharge status: Home / Self Care | Payer: Medicaid Other | Source: Ambulatory Visit | Attending: Family Medicine | Admitting: Family Medicine

## 2021-02-14 ENCOUNTER — Other Ambulatory Visit (HOSPITAL_BASED_OUTPATIENT_CLINIC_OR_DEPARTMENT_OTHER): Payer: Self-pay | Admitting: Family Medicine

## 2021-02-14 ENCOUNTER — Other Ambulatory Visit: Payer: Self-pay

## 2021-02-14 ENCOUNTER — Encounter (HOSPITAL_BASED_OUTPATIENT_CLINIC_OR_DEPARTMENT_OTHER): Payer: Self-pay

## 2021-02-14 DIAGNOSIS — C187 Malignant neoplasm of sigmoid colon: Secondary | ICD-10-CM

## 2021-02-14 DIAGNOSIS — Z1231 Encounter for screening mammogram for malignant neoplasm of breast: Secondary | ICD-10-CM | POA: Diagnosis present

## 2021-02-14 MED ORDER — METHOCARBAMOL 500 MG PO TABS: ORAL_TABLET | ORAL | 2 refills | Status: DC

## 2021-02-26 ENCOUNTER — Encounter: Payer: Self-pay | Admitting: Hematology and Oncology

## 2021-02-26 ENCOUNTER — Encounter: Payer: Self-pay | Admitting: Hematology & Oncology

## 2021-03-01 ENCOUNTER — Ambulatory Visit (AMBULATORY_SURGERY_CENTER): Payer: Medicaid Other | Admitting: Gastroenterology

## 2021-03-01 ENCOUNTER — Other Ambulatory Visit: Payer: Self-pay

## 2021-03-01 ENCOUNTER — Encounter: Payer: Self-pay | Admitting: Gastroenterology

## 2021-03-01 VITALS — BP 132/63 | HR 84 | Temp 96.8°F | Resp 14 | Ht <= 58 in | Wt 182.0 lb

## 2021-03-01 DIAGNOSIS — Z85038 Personal history of other malignant neoplasm of large intestine: Secondary | ICD-10-CM | POA: Diagnosis not present

## 2021-03-01 DIAGNOSIS — K635 Polyp of colon: Secondary | ICD-10-CM

## 2021-03-01 DIAGNOSIS — D120 Benign neoplasm of cecum: Secondary | ICD-10-CM

## 2021-03-01 DIAGNOSIS — D125 Benign neoplasm of sigmoid colon: Secondary | ICD-10-CM

## 2021-03-01 MED ORDER — SODIUM CHLORIDE 0.9 % IV SOLN: 500.0000 mL | Freq: Once | INTRAVENOUS | Status: DC

## 2021-03-01 NOTE — Progress Notes (Signed)
A and O x3. Report to RN. Tolerated MAC anesthesia well. 

## 2021-03-01 NOTE — Patient Instructions (Signed)
Please read handouts provided. Continue present medications. Await pathology results. No ibuprofen, naproxen, or other non-steriodal anti-inflammatory drugs for 2 weeks.   YOU HAD AN ENDOSCOPIC PROCEDURE TODAY AT [facility]:   Refer to the procedure report that was given to you for any specific questions about what was found during the examination.  If the procedure report does not answer your questions, please call your gastroenterologist to clarify.  If you requested that your care partner not be given the details of your procedure findings, then the procedure report has been included in a sealed envelope for you to review at your convenience later.  YOU SHOULD EXPECT: Some feelings of bloating in the abdomen. Passage of more gas than usual.  Walking can help get rid of the air that was put into your GI tract during the procedure and reduce the bloating. If you had a lower endoscopy (such as a colonoscopy or flexible sigmoidoscopy) you may notice spotting of blood in your stool or on the toilet paper. If you underwent a bowel prep for your procedure, you may not have a normal bowel movement for a few days.  Please Note:  You might notice some irritation and congestion in your nose or some drainage.  This is from the oxygen used during your procedure.  There is no need for concern and it should clear up in a day or so.  SYMPTOMS TO REPORT IMMEDIATELY:  Following lower endoscopy (colonoscopy or flexible sigmoidoscopy):  Excessive amounts of blood in the stool  Significant tenderness or worsening of abdominal pains  Swelling of the abdomen that is new, acute  Fever of 100F or higher   For urgent or emergent issues, a gastroenterologist can be reached at any hour by calling [phone]. Do not use MyChart messaging for urgent concerns.    DIET:  We do recommend a small meal at first, but then you may proceed to your regular diet.  Drink plenty of fluids but you should  avoid alcoholic beverages for 24 hours.  ACTIVITY:  You should plan to take it easy for the rest of today and you should NOT DRIVE or use heavy machinery until tomorrow (because of the sedation medicines used during the test).    FOLLOW UP: Our staff will call the number listed on your records 48-72 hours following your procedure to check on you and address any questions or concerns that you may have regarding the information given to you following your procedure. If we do not reach you, we will leave a message.  We will attempt to reach you two times.  During this call, we will ask if you have developed any symptoms of COVID 19. If you develop any symptoms (ie: fever, flu-like symptoms, shortness of breath, cough etc.) before then, please call [phone].  If you test positive for Covid 19 in the 2 weeks post procedure, please call and report this information to [place].    If any biopsies were taken you will be contacted by phone or by letter within the next 1-3 weeks.  Please call us at [phone] if you have not heard about the biopsies in 3 weeks.    SIGNATURES/CONFIDENTIALITY: You and/or your care partner have signed paperwork which will be entered into your electronic medical record.  These signatures attest to the fact that that the information above on your After Visit Summary has been reviewed and is understood.  Full responsibility of the confidentiality of this discharge information lies with  you and/or your care-partner.

## 2021-03-01 NOTE — Progress Notes (Signed)
Vitals by [place].  Admitted pt with translating by her niece [name].

## 2021-03-01 NOTE — Progress Notes (Signed)
Called to room to assist during endoscopic procedure.  Patient ID and intended procedure confirmed with present staff. Received instructions for my participation in the procedure from the performing physician.  

## 2021-03-01 NOTE — Op Note (Signed)
[facility] Patient Name: Bailey Walker Procedure Date: 03/01/2021 3:19 PM MRN: [id number] Endoscopist: Remo Lipps P. Havery Moros , MD Age: 75 Referring MD:  Date of Birth: [birth year] Gender: Female Account #: [id number] Procedure:                Colonoscopy Indications:              High risk colon cancer surveillance: Personal                            history of colon cancer - sigmoid adenocarcinoma                            stage IIIb, s/p sigmoid resection with sigmoid                            colostomy, s/p chemotherapy. Awaiting reversal of                            colostomy Medicines:                Monitored Anesthesia Care Procedure:                Pre-Anesthesia Assessment:                           - Prior to the procedure, a History and Physical                            was performed, and patient medications and                            allergies were reviewed. The patient's tolerance of                            previous anesthesia was also reviewed. The risks                            and benefits of the procedure and the sedation                            options and risks were discussed with the patient.                            All questions were answered, and informed consent                            was obtained. Prior Anticoagulants: The patient has                            taken no previous anticoagulant or antiplatelet                            agents. ASA Grade Assessment: III - A patient with  severe systemic disease. After reviewing the risks                            and benefits, the patient was deemed in                            satisfactory condition to undergo the procedure.                           After obtaining informed consent, the colonoscope                            was passed under direct vision. Throughout the                            procedure, the patient's blood pressure, pulse, and                             oxygen saturations were monitored continuously. The                            Olympus PCF-[id number] (#[id number]) Colonoscope was                            introduced through the sigmoid colostomy and                            advanced to the the cecum, identified by                            appendiceal orifice and ileocecal valve. The                            colonoscopy was performed without difficulty. The                            patient tolerated the procedure well. The quality                            of the bowel preparation was adequate. The                            ileocecal valve, the appendiceal orifice and the                            rectum were photographed. Scope In: 3:47:32 PM Scope Out: 4:08:59 PM Scope Withdrawal Time: 0 hours 13 minutes 59 seconds  Total Procedure Duration: 0 hours 21 minutes 27 seconds  Findings:                 The perianal and digital rectal examinations were                            normal.  Residual hard mucous / exudate was found in the                            rectum which could not be cleared. Very limited                            views of rectal pouch obtained.                           A 4 mm polyp was found in the cecum. The polyp was                            sessile. The polyp was removed with a cold snare.                            Resection and retrieval were complete.                           A 5 mm polyp was found in the sigmoid colon. The                            polyp was pedunculated with a long stalk. The polyp                            was removed with a hot snare. Resection and                            retrieval were complete.                           The exam was otherwise without abnormality. Complications:            No immediate complications. Estimated blood loss:                            Minimal. Estimated Blood Loss:     Estimated blood loss was  minimal. Impression:               - Residual mucous / exudate in the rectal pouch                            which could not be cleared.                           - One 4 mm polyp in the cecum, removed with a cold                            snare. Resected and retrieved.                           - One 5 mm polyp in the sigmoid colon, removed with  a hot snare. Resected and retrieved.                           - The examination was otherwise normal.                           Okay to proceed with reversal of colostomy, no high                            risk pathology appreciated on this exam. Recommendation:           - Patient has a contact number available for                            emergencies. The signs and symptoms of potential                            delayed complications were discussed with the                            patient. Return to normal activities tomorrow.                            Written discharge instructions were provided to the                            patient.                           - Resume previous diet.                           - Continue present medications.                           - Await pathology results.                           - No ibuprofen, naproxen, or other non-steroidal                            anti-inflammatory drugs for 2 weeks after polyp                            removal. Remo Lipps P. Dejia Ebron, MD 03/01/2021 4:17:56 PM This report has been signed electronically.

## 2021-03-05 ENCOUNTER — Ambulatory Visit: Payer: Self-pay | Admitting: Surgery

## 2021-03-05 ENCOUNTER — Encounter: Payer: Self-pay | Admitting: Surgery

## 2021-03-05 ENCOUNTER — Telehealth: Payer: Self-pay

## 2021-03-05 DIAGNOSIS — Z789 Other specified health status: Secondary | ICD-10-CM | POA: Insufficient documentation

## 2021-03-05 NOTE — H&P (Signed)
[facility] Appointment: 03/05/2021 11:30 AM Location: [facility] Patient #: [id number] DOB: [birth year] Single / Language: Cleophus Molt / Race: Refused to Report/Unreported Female  History of Present Illness Adin Hector MD; 03/05/2021 12:22 PM) The patient is a 75 year old female who presents with colonic obstruction. 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  Did 8 cycles of FOLFOX chemotherapy started September 2021.  Finished February 2022.  Patient returns for follow-up.  Her niece again is with her and acts as interpreter.  [place].  Speaks primarily [place] but can speak Arabic and [place] as well.  She is done cardiac clearance.  Ejection fraction 55-60 percent.  Had colonoscopy y Dr. Havery Moros.  last week.  1 small colon polyp.  No other major suspicious lesions noted except for small adenomatous polyp.  She again wishes to consider colostomy takedown.  When I saw her 3 months ago, she is quite deconditioned just finished chemotherapy.  Her medical oncologist is hoping to be aggressive but I wished to be a little more gradual since she still had an open wound with probable small bowel with a large incisional hernia.  She actually was admitted in April with chest pain.  No major cardiac or pulmonary issues but given calcium score was recommended for Lipitor and aspirin.  Patient is eating better.  Energy level is little bit better but still moves slowly.  She is using a foot exercise petal machine a little more.  She's been having a lot of knee pain.  Initially was hesitant to see orthopedics about a but wishes to reconsider.  MRI planned.  She is debating about flying back to [place] to see family this summer.  The skin has dried up over her midline incision but not flakes off and will not  fully healed just yet.  Dry gauze over it   PRIOR NOTE 11/2020: Elderly woman that required urgent resection for colon obstruction July 2021.  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  Did 8 cycles of FOLFOX chemotherapy started September 2021.  Finished February 2022.  She comes today with her niece as  an interpreter.  Her medical oncologist, Dr. Marin Olp, wants to get her colostomy down as soon as possible.  Referral to me  No personal nor family history of inflammatory bowel disease, irritable bowel syndrome, allergy such as Celiac Sprue, dietary/dairy problems, colitis, ulcers nor gastritis.  No recent sick contacts/gastroenteritis.  No travel outside the country.  No changes in diet.  No dysphagia to solids or liquids.  No significant heartburn or reflux.  No melena, hematemesis, coffee ground emesis.  No evidence of prior gastric/peptic ulceration. ` ###########################################  Pathology: StageIIIB ([id number]) adenocarcinoma of the sigmoid colon -- 1/16 positive lymph nodes/ pMMR ` ###########################################`   SIGMOID COLECTOMY AND COLOSTOMY  Procedure Note  Rya Koraeel Kulish 04/20/2020   Pre-op Diagnosis: colon obstruction    Post-op Diagnosis: same  Procedure(s): SIGMOID COLECTOMY AND END COLOSTOMY  Surgeon(s): Coralie Keens, MD  Anesthesia: General  Staff: Circulator: Rometta Emery, RN Physician Assistant: Jill Alexanders, PA-C Scrub Person: Rolan Bucco Circulator Assistant: Gleason, Chloe, RN  Estimated Blood Loss: less than 100 mL              Specimens: sent to path  Indications: This is a 75 year old female who presents with  an obstructing mass in her distal sigmoid colon.  She had undergone endoscopy which showed that the colon was completely obstructed.  She was tattooed just distal to the mass.  Her CEA level was elevated preoperatively.  There was no Pritesh Sobecki metastatic disease in the  rest of the abdominal cavity including liver.  The tumor was growing through the sidewall of the colon and into the retroperitoneum.  Procedure: The patient was brought to operating identifies correct patient.  She is placed upon the operating table general anesthesia was induced.  Her abdomen was then prepped and draped in usual sterile fashion.  I created midline incision with a scalpel below the umbilicus.  I carried this down through the subtenons tissue to the fascia with the cautery.  I then opened the fascia and the peritoneum the entire length of the incision.  Upon entering the abdomen there was mild amount of ascites.  I could easily palpate the large mass in the sigmoid colon.  I extended my incision above the umbilicus.  I evaluated the right colon hepatic flexure and found no evidence of tumor or obstruction there.  The liver was normal to palpation.  I said no Deshia Vanderhoof metastatic implants.  The mass itself was in the sigmoid colon and fixed to the lateral peritoneum.  It felt like the mass was coming through the bowel wall.  There was no evidence of perforation.  I mobilized the descending colon along the white line of [place] with the cautery.  I was able to stay around the mass and dissected out the retroperitoneum.  I visualized the left ureter which appeared to be left intact.  I was able to finally get the entire mass and colon away from the retroperitoneum and elevated.  I then transected the proximal rectum with the contour stapler.  I then took down the mesentery with the LigaSure.  I then identified descending colon proximal to the mass and transected it with a GIA-75 stapler.  I then took down the rest of the mesentery with the LigaSure device.  The mass was then sent to pathology for evaluation.  I achieved hemostasis in the retroperitoneum with the cautery.  I irrigated the abdomen with normal saline.  Again hemostasis appeared to be achieved.  I placed a 2-0 Prolene suture at the center of  the suture line of the rectal stump.  We then mobilized the descending colon further.  I made a separate elliptical incision the patient's left mid abdomen with the cautery.  I carried this down to the fascia which was then opened in a cruciate fashion.  The underlying muscle fibers were retracted and the peritoneum was opened.  We then pulled the descending colon out of this incision as and end colostomy.  We then irrigated the abdomen further with normal saline.  Hemostasis appeared to be achieved.  The patient's midline fascia was then closed with running #1 looped PDS suture.  I anesthetized the fascia circumferentially with Exparel.  Irrigated the incision with saline.  We then closed the incision with staples.  I then remove the staple line at the ostomy site with the cautery.  I then matured the ostomy circumferentially with 3-0 Vicryl sutures.  The ostomy appeared perfused.  An occlusive bandage was placed over the midline incision and an ostomy appliance was applied.  The patient tolerated the procedure well.  All the counts were correct at the end of the procedure.  The patient was then extubated in the operating room and  taken in a stable condition to the recovery room.         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 Mupirocin  (2% Ointment, External) Active. Aspirin Low Dose  (81MG  Tablet DR, Oral) Active. Dexilant  (30MG  Capsule DR, Oral) Active. Colace  (Oral) Specific strength unknown - Active. Ensure Enlive  (Oral) Active. Robaxin  (500MG  Tablet, Oral) Active. Multiple Minerals-Vitamins  (Oral) Active. Aspirin  (81MG  Tablet, Oral) Active. Synthroid  (Oral) Specific strength unknown - Active. glipiZIDE  (Oral) Specific strength unknown - Active. Metoprolol Succinate ER  (25MG  Tablet ER 24HR, Oral) Active. metFORMIN HCl  (Oral) Specific strength unknown - Active. Medications Reconciled   Social History Adin Hector, MD; 03/05/2021 11:42 AM) Alcohol use  Occasional alcohol use. [place]born in [place] Speaks Arabic only. Niece bilingual & very involved in care  Caffeine use  Tea. No drug use  Tobacco use  Former smoker.  Family History Adin Hector, MD; 03/05/2021 11:42 AM) Cancer  Sister. Diabetes Mellitus  Sister. Heart Disease  Brother. Hypertension  Brother.  Pregnancy / Birth History Adin Hector, MD; 03/05/2021 11:42 AM) Age at menarche  51 years. Age of menopause  42-55 Gravida  0 Para  0  Other Problems Adin Hector, MD; 03/05/2021 11:42 AM) Anxiety Disorder  Back Pain  Chest pain  Diabetes Mellitus  Gastric Ulcer  Hemorrhoids  High blood pressure  Thyroid Disease  Transfusion history      Physical Exam Adin Hector MD; 03/05/2021 12:06 PM)  General Mental Status - Alert. General Appearance - Not in acute distress, Not Sickly. Orientation - Oriented X3. Hydration - Well hydrated. Voice - Normal. Note:   Pleasant.  Tired but not toxic nor sickly.  Moves very slowly with one-person assistance get up to the examination table.  Does not want totally supine  Integumentary Global Assessment Upon inspection and palpation of skin surfaces of the - Axillae: non-tender, no inflammation or ulceration, no drainage. and Distribution of scalp and body hair is normal. General Characteristics Temperature - normal warmth is noted.  Head and Neck Head - normocephalic, atraumatic with no lesions or palpable masses. Face Global Assessment - atraumatic, no absence of expression. Neck Global Assessment - no abnormal movements, no bruit auscultated on the right, no bruit auscultated on the left, no decreased range of motion, non-tender. Trachea - midline. Thyroid Gland Characteristics - non-tender.  Eye Eyeball - Left - Extraocular movements intact, No Nystagmus - Left. Eyeball - Right - Extraocular movements intact, No Nystagmus - Right. Cornea - Left - No Hazy - Left.  Cornea - Right - No Hazy - Right. Sclera/Conjunctiva - Left - No scleral icterus, No Discharge - Left. Sclera/Conjunctiva - Right - No scleral icterus, No Discharge - Right. Pupil - Left - Direct reaction to light normal. Pupil - Right - Direct reaction to light normal.  ENMT Ears Pinna - Left - no drainage observed, no generalized tenderness observed. Pinna - Right - no drainage observed, no generalized tenderness observed. Nose and Sinuses External Inspection of the Nose - no destructive lesion observed. Inspection of the nares - Left - quiet respiration. Inspection of the nares - Right - quiet respiration. Mouth and Throat Lips - Upper Lip - no fissures observed, no pallor noted. Lower Lip - no fissures observed, no pallor noted. Nasopharynx - no discharge present. Oral Cavity/Oropharynx - Tongue - no dryness observed. Oral Mucosa - no cyanosis observed. Hypopharynx - no evidence of airway distress observed.  Chest and Lung  Exam Inspection Movements - Normal and Symmetrical. Accessory muscles - No use of accessory muscles in breathing. Palpation Palpation of the chest reveals - Non-tender. Auscultation Breath sounds - Normal and Clear.  Cardiovascular Auscultation Rhythm - Regular. Murmurs & Other Heart Sounds - Auscultation of the heart reveals - No Murmurs and No Systolic Clicks.  Abdomen Inspection Inspection of the abdomen reveals - No Visible peristalsis and No Abnormal pulsations. Umbilicus - No Bleeding, No Urine drainage. Palpation/Percussion Palpation and Percussion of the abdomen reveal - Soft, Non Tender, No Rebound tenderness, No Rigidity (guarding) and No Cutaneous hyperesthesia. Note:  Abdomen obese with a panniculus but soft.  Thinned out periumbilical region 7x5 cm mostly closed down with moderate flaky skin. Rather dry scab skin. This correlates with small bowel on CT scan.  Left lateral colostomy with some swelling possible parastomal hernia versus just within her panniculus. But no erythema and decent sealing around the colostomy bag.  Ostomy pink  Female Genitourinary Sexual Maturity Tanner 5 - Adult hair pattern. Note:  No vaginal bleeding nor discharge  Peripheral Vascular Upper Extremity Inspection - Left - No Cyanotic nailbeds - Left, Not Ischemic. Inspection - Right - No Cyanotic nailbeds - Right, Not Ischemic.  Neurologic Neurologic evaluation reveals  - normal attention span and ability to concentrate, able to name objects and repeat phrases. Appropriate fund of knowledge , normal sensation and normal coordination. Mental Status Affect - not angry, not paranoid. Cranial Nerves - Normal Bilaterally. Gait - Normal.  Neuropsychiatric Mental status exam performed with findings of - able to articulate well with normal speech/language, rate, volume and coherence, thought content normal with ability to perform basic computations and apply abstract reasoning and no evidence of  hallucinations, delusions, obsessions or homicidal/suicidal ideation.  Musculoskeletal Global Assessment Spine, Ribs and Pelvis - no instability, subluxation or laxity. Right Upper Extremity - no instability, subluxation or laxity.  Lymphatic Head & Neck General Head & Neck Lymphatics: Bilateral - Description - No Localized lymphadenopathy. Axillary General Axillary Region: Bilateral - Description - No Localized lymphadenopathy. Femoral & Inguinal Generalized Femoral & Inguinal Lymphatics: Left - Description - No Localized lymphadenopathy. Right - Description - No Localized lymphadenopathy.    Assessment & Plan Adin Hector MD; 03/05/2021 12:23 PM)  Fatima Blank HERNIA, WITHOUT OBSTRUCTION OR GANGRENE (K43.2) Story: Midline incisional hernia with fascial dehiscence due to postop abscess and deconditioned state and chemotherapy. Impression: At some point she would benefit from surgery. I'll await 6 months off the chemotherapy before considering this. Make sure the skin is closed over and can separate. Ideally  would do robotic lysing adhesions down in the pelvis and around the colostomy before making an incision around the small bowel loops to excise skin.  Most likely will need some type of Phasix mesh as a bridge. Probable component separation. Possible pannicular resection as well. High risk of recurrence like to be dealt with in the future with a separate permanent mesh repair. She is at risk with her deconditioned state, recent chemotherapy, obesity. I need more time for the skin to heal and pull off the small bowel before proceeding with hernia repair. Hopefully in 3 more months. We will see  Current Plans Follow up with [place] in the office in 2 months.   Call us sooner as needed.  CCS Consent - Hernia Repair - Ventral/Incisional/Umbilical (Teddy Pena): discussed with patient and provided information.  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 She is able to pouch rotted and is nonobstructive from a. Most likely will be primary repair. High risk of recurrence  Current Plans The anatomy & physiology of the digestive tract was discussed.  The pathophysiology was discussed.  Possibility of remaining with an ostomy permanently was discussed.  I offered ostomy takedown.  Laparoscopic & open techniques were discussed.  Risks such as bleeding, infection, abscess, leak, reoperation, possible re-ostomy, injury to other organs, hernia, heart attack, death, and other risks were discussed.   I noted a good likelihood this will help address the problem.  Goals of post-operative recovery were discussed as well.  We will work to minimize complications.  Questions were answered.  The patient expresses understanding & wishes to proceed with surgery.  Pt Education - [place] (Sade Mehlhoff): discussed with patient and provided information.  HISTORY OF COLON CANCER [phone]) Story: Obstructing sigmoid cancer status post [facility] resection July 2021  Completion of chemotherapy January 2022. Impression: At some point, she would benefit from colostomy takedown. We are now 5 months since completion of chemotherapy. Given her deconditioned state, would err towards another 3 months to allow her skin to recover. Need to make sure that it is coming off the small bowel well. Give a chance for her to have better rehabilitation as well.  I will tentatively plan for surgery in the fall. Robotic lysis adhesions with colostomy takedown most likely of open abdominal wall reconstruction with component separation and Phasix mesh for her hernia. Probable panniculectomy as well. This would be a prolonged case. This would give a chance to try and address all issues.  She has had cardiac clearance  She's working to exercise more. I strongly encouraged her to do that.  She is wanting to fly back to [place] to see family this  summer. That is reasonable.  She wanted to reconsider getting her knees evaluated by [facility] orthopedics. I think they are going to start with an MRI and go from there. As long as there is no major joint replacement planned, I do not think that affects my surgical planning. Should they 1 something more elaborate, need to stagger interventions so that she is not getting colostomy takedown read the time of a prosthesis placement  Again a long discussion with the patient and her niece who again acted as interpreter. Patient prefers to speak in [place] but is also fluent in [place] and [place]. Just non-English.  Current Plans Consider follow up colonoscopy by your gastroenterologist.   Since you had a colorectal cancer resected by surgery, you should strongly consider getting a colonoscopy  by your gastroenterologict in one year after the surgery that removed your cancer.  Call your gastroenterologist for advice  Pt Education - [facility] Colorectal Cancer ([place]): discussed with patient and provided information.  BMI 30.0-30.9,ADULT (Z68.30)   DYSPNEA ON EXERTION (R06.00) Impression: Given her fair performance status, ask cardiology to reevaluate. She had an acceptable echocardiogram a few years ago that was before her emergency surgery and chemotherapy.  Seen by cardiology and cleared with decent ejection fraction. Performance status a little more with some calcifications. On aspirin and statin.  Current Plans I recommended obtaining preoperative cardiac clearance.  I am concerned about the health of the patient and the ability to tolerate the operation.  Therefore, we will request clearance by cardiology to better assess operative risk & see if a reevaluation, further workup, etc is needed.  Also recommendations on how medications such as for anticoagulation and blood pressure should be managed/held/restarted after surgery.  NON-ENGLISH SPEAKING PATIENT (Z78.9) Impression: [place] woman that speaks  Aramaic > Arabic/French. Discussion made the presence of her niece who acted as Astronomer.   PREOP COLON - ENCOUNTER FOR PREOPERATIVE EXAMINATION FOR GENERAL SURGICAL PROCEDURE (Z01.818)  Current Plans You are being scheduled for surgery - Our schedulers will call you.   You should hear from our office's scheduling department within 5 working days about the location, date, and time of surgery.  We try to make accommodations for patient's preferences in scheduling surgery, but sometimes the OR schedule or the surgeon's schedule prevents [place] from making those accommodations.  If you have not heard from our office [phone] in 5 working days, call the office and ask for your surgeon's nurse.  If you have other questions about your diagnosis, plan, or surgery, call the office and ask for your surgeon's nurse.  Written instructions provided Pt Education - [facility] Colon Bowel Prep 2018 ERAS/Miralax/Antibiotics Pt Education - Pamphlet Given - Laparoscopic Colorectal Surgery: discussed with patient and provided information. Pt Education - [facility] Colectomy post-op instructions: discussed with patient and provided information. Started metroNIDAZOLE 500 MG Oral Tablet, 2 (two) Tablet three times daily, #6, 1 day starting 03/05/2021, No Refill. Local Order: Pharmacist Notes: Pharmacy Instructions: Take 2 tablets at 2pm, 3pm, and 10pm the day prior to your colon operation. Started Neomycin Sulfate 500 MG Oral Tablet, 2 (two) Tablet SEE NOTE, #6, 03/05/2021, No Refill. Local Order: Pharmacist Notes: TAKE TWO TABLETS AT 2 PM, 3 PM, AND 10 PM THE DAY PRIOR TO SURGERY

## 2021-03-05 NOTE — Telephone Encounter (Signed)
  Follow up Call-  Call back number 03/01/2021  Post procedure Call Back phone  # [phone]- niece [name]  Permission to leave phone message Yes  Some recent data might be hidden     Patient questions:  Do you have a fever, pain , or abdominal swelling? No. Pain Score  0 *  Have you tolerated food without any problems? Yes.    Have you been able to return to your normal activities? Yes.    Do you have any questions about your discharge instructions: Diet   No. Medications  No. Follow up visit  No.  Do you have questions or concerns about your Care? No.  Actions: * If pain score is 4 or above: No action needed, pain <4.  Have you developed a fever since your procedure? no  2.   Have you had an respiratory symptoms (SOB or cough) since your procedure? no  3.   Have you tested positive for COVID 19 since your procedure no  4.   Have you had any family members/close contacts diagnosed with the COVID 19 since your procedure?  no   If yes to any of these questions please route to Joylene John, RN and Joella Prince, RN

## 2021-03-26 ENCOUNTER — Ambulatory Visit: Payer: Medicaid Other | Admitting: Podiatry

## 2021-04-01 ENCOUNTER — Other Ambulatory Visit: Payer: Medicaid Other

## 2021-04-01 ENCOUNTER — Ambulatory Visit: Payer: Medicaid Other | Admitting: Hematology & Oncology

## 2021-04-10 ENCOUNTER — Ambulatory Visit: Payer: Medicaid Other

## 2021-05-03 ENCOUNTER — Other Ambulatory Visit: Payer: Self-pay | Admitting: Urology

## 2021-05-28 ENCOUNTER — Ambulatory Visit: Payer: Self-pay | Admitting: Surgery

## 2021-05-28 MED ORDER — DEXTROSE 5 % IV SOLN: 900.0000 mg | INTRAVENOUS | Status: DC

## 2021-05-28 MED ORDER — GENTAMICIN SULFATE 40 MG/ML IJ SOLN
5.0000 mg/kg | INTRAMUSCULAR | Status: DC
Start: 2021-05-28 — End: 2022-09-07

## 2021-05-31 ENCOUNTER — Emergency Department (HOSPITAL_BASED_OUTPATIENT_CLINIC_OR_DEPARTMENT_OTHER)
Admission: EM | Admit: 2021-05-31 | Discharge: 2021-05-31 | Disposition: A | Discharge status: Home / Self Care | Payer: Medicaid Other | Attending: Emergency Medicine | Admitting: Emergency Medicine

## 2021-05-31 ENCOUNTER — Emergency Department (HOSPITAL_BASED_OUTPATIENT_CLINIC_OR_DEPARTMENT_OTHER): Payer: Medicaid Other

## 2021-05-31 ENCOUNTER — Other Ambulatory Visit: Payer: Self-pay

## 2021-05-31 ENCOUNTER — Encounter (HOSPITAL_BASED_OUTPATIENT_CLINIC_OR_DEPARTMENT_OTHER): Payer: Self-pay

## 2021-05-31 ENCOUNTER — Telehealth: Payer: Self-pay | Admitting: *Deleted

## 2021-05-31 DIAGNOSIS — Z7982 Long term (current) use of aspirin: Secondary | ICD-10-CM | POA: Insufficient documentation

## 2021-05-31 DIAGNOSIS — N83202 Unspecified ovarian cyst, left side: Secondary | ICD-10-CM | POA: Insufficient documentation

## 2021-05-31 DIAGNOSIS — Z79899 Other long term (current) drug therapy: Secondary | ICD-10-CM | POA: Diagnosis not present

## 2021-05-31 DIAGNOSIS — Z85038 Personal history of other malignant neoplasm of large intestine: Secondary | ICD-10-CM | POA: Diagnosis not present

## 2021-05-31 DIAGNOSIS — K219 Gastro-esophageal reflux disease without esophagitis: Secondary | ICD-10-CM | POA: Insufficient documentation

## 2021-05-31 DIAGNOSIS — N838 Other noninflammatory disorders of ovary, fallopian tube and broad ligament: Secondary | ICD-10-CM

## 2021-05-31 DIAGNOSIS — E039 Hypothyroidism, unspecified: Secondary | ICD-10-CM | POA: Diagnosis not present

## 2021-05-31 DIAGNOSIS — E1169 Type 2 diabetes mellitus with other specified complication: Secondary | ICD-10-CM | POA: Diagnosis not present

## 2021-05-31 DIAGNOSIS — Z7984 Long term (current) use of oral hypoglycemic drugs: Secondary | ICD-10-CM | POA: Diagnosis not present

## 2021-05-31 DIAGNOSIS — R1031 Right lower quadrant pain: Secondary | ICD-10-CM | POA: Diagnosis present

## 2021-05-31 DIAGNOSIS — Z87891 Personal history of nicotine dependence: Secondary | ICD-10-CM | POA: Insufficient documentation

## 2021-05-31 DIAGNOSIS — I10 Essential (primary) hypertension: Secondary | ICD-10-CM | POA: Diagnosis not present

## 2021-05-31 DIAGNOSIS — R1900 Intra-abdominal and pelvic swelling, mass and lump, unspecified site: Secondary | ICD-10-CM

## 2021-05-31 LAB — CBC
HCT: 41.5 % (ref 36.0–46.0)
Hemoglobin: 13.6 g/dL (ref 12.0–15.0)
MCH: 30.1 pg (ref 26.0–34.0)
MCHC: 32.8 g/dL (ref 30.0–36.0)
MCV: 91.8 fL (ref 80.0–100.0)
Platelets: 286 10*3/uL (ref 150–400)
RBC: 4.52 MIL/uL (ref 3.87–5.11)
RDW: 13.2 % (ref 11.5–15.5)
WBC: 10.5 10*3/uL (ref 4.0–10.5)
nRBC: 0 % (ref 0.0–0.2)

## 2021-05-31 LAB — URINALYSIS, MICROSCOPIC (REFLEX)

## 2021-05-31 LAB — URINALYSIS, ROUTINE W REFLEX MICROSCOPIC
Bilirubin Urine: NEGATIVE
Glucose, UA: NEGATIVE mg/dL
Hgb urine dipstick: NEGATIVE
Ketones, ur: NEGATIVE mg/dL
Nitrite: NEGATIVE
Protein, ur: NEGATIVE mg/dL
Specific Gravity, Urine: 1.02 (ref 1.005–1.030)
pH: 5 (ref 5.0–8.0)

## 2021-05-31 LAB — COMPREHENSIVE METABOLIC PANEL
ALT: 22 U/L (ref 0–44)
AST: 26 U/L (ref 15–41)
Albumin: 4.4 g/dL (ref 3.5–5.0)
Alkaline Phosphatase: 50 U/L (ref 38–126)
Anion gap: 10 (ref 5–15)
BUN: 15 mg/dL (ref 8–23)
CO2: 25 mmol/L (ref 22–32)
Calcium: 9.4 mg/dL (ref 8.9–10.3)
Chloride: 102 mmol/L (ref 98–111)
Creatinine, Ser: 0.54 mg/dL (ref 0.44–1.00)
GFR, Estimated: >60 mL/min (ref >60)
Glucose, Bld: 137 mg/dL — ABNORMAL HIGH (ref 70–99)
Potassium: 4.2 mmol/L (ref 3.5–5.1)
Sodium: 137 mmol/L (ref 135–145)
Total Bilirubin: 0.4 mg/dL (ref 0.3–1.2)
Total Protein: 8.3 g/dL — ABNORMAL HIGH (ref 6.5–8.1)

## 2021-05-31 LAB — LIPASE, BLOOD: Lipase: 17 U/L (ref 11–51)

## 2021-05-31 MED ORDER — HYDROCODONE-ACETAMINOPHEN 5-325 MG PO TABS
1.0000 | ORAL_TABLET | Freq: Once | ORAL | Status: AC
Start: 2021-05-31 — End: 2021-05-31
  Administered 2021-05-31: 1 via ORAL
  Filled 2021-05-31: qty 1

## 2021-05-31 MED ORDER — IOHEXOL 350 MG/ML SOLN
100.0000 mL | Freq: Once | INTRAVENOUS | Status: AC | PRN
  Administered 2021-05-31: 80 mL via INTRAVENOUS

## 2021-05-31 MED ORDER — HEPARIN SOD (PORK) LOCK FLUSH 100 UNIT/ML IV SOLN
500.0000 [IU] | Freq: Once | INTRAVENOUS | Status: AC
  Administered 2021-05-31: 500 [IU]
  Filled 2021-05-31: qty 5

## 2021-05-31 MED ORDER — CEPHALEXIN 500 MG PO CAPS: 500.0000 mg | ORAL_CAPSULE | Freq: Two times a day (BID) | ORAL | 0 refills | Status: DC

## 2021-05-31 MED ORDER — HYDROCODONE-ACETAMINOPHEN 5-325 MG PO TABS: 1.0000 | ORAL_TABLET | ORAL | 0 refills | Status: DC | PRN

## 2021-05-31 NOTE — Discharge Instructions (Addendum)
Contact Dr. Johney Maine to let him know about the ovarian mass prior to your upcoming surgery.  Contact Dr. Marin Olp on Monday to follow-up with him.  Return to emergency room if you have any worsening symptoms.

## 2021-05-31 NOTE — ED Triage Notes (Signed)
Per pt through family member interpreting-pt c/o right side abd pain started 4am-nausea-denies v/d-NAD-slow gait

## 2021-05-31 NOTE — ED Notes (Signed)
Patient transported to CT 

## 2021-05-31 NOTE — Progress Notes (Addendum)
COVID swab appointment: 06/18/21  COVID Vaccine Completed: no Date COVID Vaccine completed: Has received booster: COVID vaccine manufacturer: [place]   Date of COVID positive in last 90 days: [place]  PCP - [name], MD Cardiologist -  Quay Burow, MD  Cardiac clearance by Marvis Moeller 01/03/21 Epic.  Chest x-ray - 02/13/21 Epic EKG - 12/26/20 Epic Stress Test - 09/09/17 ECHO - 12/27/20 Epic Cardiac Cath - N/a Pacemaker/ICD device last checked: N/a Spinal Cord Stimulator: N/a  Sleep Study - N/a CPAP -   Fasting Blood Sugar - doesn't check at home. Has a glucose machine from sister that she doesn't use. Not instructed to check blood sugars from doctor per family member. Checks Blood Sugar __ times a day  Blood Thinner Instructions: Aspirin Instructions: ASA 81, no instructions given Last Dose:  Activity level: Can go up a flight of stairs and perform activities of daily living without stopping and without symptoms of chest pain or shortness of breath. SOB with exertion, not new       Anesthesia review: SVT, HTN, DM, CP PAT BS 257  Patient denies shortness of breath, fever, cough and chest pain at PAT appointment   Patient verbalized understanding of instructions that were given to them at the PAT appointment. Patient was also instructed that they will need to review over the PAT instructions again at home before surgery.

## 2021-05-31 NOTE — ED Notes (Signed)
Pt up to Restroom.

## 2021-05-31 NOTE — ED Notes (Addendum)
First contact with patient. Patient arrived via triage from home with husband with complaints of right lower abdominal pain x last night. Pt has a known ostomy on her left side and denies pain on left side. Pt is A&OX 4. Respirations even/unlabored. Patient changed into gown and placed on and call light within reach. Patient updated on plan of care. Pt prefers husband interpret for her - speaks Aramaic. Will continue to monitor patient.   1623: Patients power port accessed at this time. Power Port card visualized by this Therapist, sports. Flushes without incident. Ct made aware.   1708: Report given to Legrand Como RN for continuation of care. No acute distress noted upon this RN's departure of patient.

## 2021-05-31 NOTE — ED Provider Notes (Signed)
[facility] Provider Note   CSN: [id number] Arrival date & time: 05/31/21  1022     History Chief Complaint  Patient presents with   Abdominal Pain    Bailey Walker is a 75 y.o. female.  Patient is a 75 year old female with a history of diabetes, hypertension, hyperlipidemia and prior colon cancer status postresection with a current colostomy.  She also has an incisional hernia per chart review.  She is scheduled to have repair of the hernia and colostomy takedown later this month.  She completed a course of chemotherapy about 7 to 8 months ago.  She presents with abdominal pain that started at 4 AM this morning.  Its sharp pain that is been constant in her right mid and lower abdomen.  Its otherwise nonradiating.  She has associated nausea but no vomiting.  No urinary symptoms.  No visible hematuria.  No change in her colostomy output.  No fevers.  Patient speaks Aramaic.  She declines interpreter.  She has a family member here who helps translate.      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, [place].  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 ([place]) 04/26/2020   Goals of care, counseling/discussion 04/26/2020   Gastritis and gastroduodenitis    Grade II internal hemorrhoids    Abdominal distension    Nausea and vomiting in adult    Abnormal CT scan, gastrointestinal tract    Colon obstruction ([place])  04/16/2020   UTI (urinary tract infection) 04/16/2020   Dyspnea on exertion 01/27/2020   Iron deficiency anemia 01/26/2020   Vitamin B12 deficiency anemia 01/26/2020   Pseudophakia of both eyes 05/18/2019   History of Descemet membrane endothelial keratoplasty (DMEK) 10/31/2018   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   Fuchs' corneal dystrophy 09/20/2018   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   Diabetes mellitus due to underlying condition with unspecified complications ([place]) [id number]   Ex-smoker 08/26/2017   Atypical chest pain 08/26/2017   Essential hypertension 01/11/2017   Hypothyroidism 01/08/2017   SVT (supraventricular tachycardia) ([place]) 01/08/2017    Past Surgical History:  Procedure Laterality Date   APPENDECTOMY     BIOPSY  04/18/2020   Procedure: BIOPSY;  Surgeon: Lavena Bullion, DO;  Location: [facility];  Service: Gastroenterology;;   BIOPSY  04/19/2020   Procedure: BIOPSY;  Surgeon: Lavena Bullion, DO;  Location: [facility];  Service: Gastroenterology;;   BREAST SURGERY     Fatty tissue on biopsy   ESOPHAGOGASTRODUODENOSCOPY (EGD) WITH PROPOFOL N/A 04/18/2020   Procedure: ESOPHAGOGASTRODUODENOSCOPY (EGD) WITH PROPOFOL;  Surgeon: Lavena Bullion, DO;  Location: [facility];  Service: Gastroenterology;  Laterality: N/A;   EYE SURGERY Bilateral    april and march 2019 for cataracts.    fatty gland     left wrist , right breast   FLEXIBLE SIGMOIDOSCOPY N/A 04/18/2020   Procedure: FLEXIBLE SIGMOIDOSCOPY;  Surgeon: Gerrit Heck  V, DO;  Location: [facility];  Service: Gastroenterology;  Laterality: N/A;   FLEXIBLE SIGMOIDOSCOPY N/A 04/19/2020   Procedure: FLEXIBLE SIGMOIDOSCOPY;  Surgeon: Lavena Bullion, DO;  Location: [facility];  Service: Gastroenterology;  Laterality: N/A;   INCISION AND DRAINAGE OF WOUND N/A 05/01/2020   Procedure: ABDOMINAL  WOUND EXPLORATION; IRRIGATION AND DEBRIDEMENT WOUND;  Surgeon:  Rolm Bookbinder, MD;  Location: [facility];  Service: General;  Laterality: N/A;   IR IMAGING GUIDED PORT INSERTION  06/13/2020   KNEE SURGERY     left knee   LAPAROTOMY N/A 04/20/2020   Procedure: SIGMOID COLECTOMY AND COLOSTOMY;  Surgeon: Coralie Keens, MD;  Location: [facility];  Service: General;  Laterality: N/A;   SUBMUCOSAL TATTOO INJECTION  04/19/2020   Procedure: SUBMUCOSAL TATTOO INJECTION;  Surgeon: Lavena Bullion, DO;  Location: [facility];  Service: Gastroenterology;;     OB History   No obstetric history on file.     Family History  Problem Relation Age of Onset   Breast cancer Cousin        mat and pat sides   Cancer Sister        cancer everywhere   Heart disease Brother    Colon cancer Neg Hx    Esophageal cancer Neg Hx    Rectal cancer Neg Hx     Social History   Tobacco Use   Smoking status: Former    Types: Cigarettes    Quit date: 12/29/2013    Years since quitting: 7.4   Smokeless tobacco: Never  Vaping Use   Vaping Use: Never used  Substance Use Topics   Alcohol use: No   Drug use: No    Home Medications Prior to Admission medications   Medication Sig Start Date End Date Taking? Authorizing Provider  HYDROcodone-acetaminophen (NORCO/VICODIN) 5-325 MG tablet Take 1 tablet by mouth every 4 (four) hours as needed. 05/31/21  Yes Malvin Johns, MD  acetaminophen (TYLENOL) 500 MG tablet Take 500 mg by mouth in the morning and at bedtime.    [provider]  alendronate (FOSAMAX) 70 MG tablet Take 70 mg by mouth every Sunday. 02/22/21   [provider]  aspirin EC 81 MG tablet Take 81 mg by mouth daily. Swallow whole.    [provider]  atorvastatin (LIPITOR) 10 MG tablet Take 2 tablets (20 mg total) by mouth daily. 12/28/20 05/30/21  Kayleen Memos, DO  feeding supplement, ENSURE ENLIVE, (ENSURE ENLIVE) LIQD Take 237 mLs by mouth 3 (three) times daily between meals. Patient taking differently: Take 237 mLs by mouth in the morning  and at bedtime. 05/02/20   Sheikh, Omair Latif, DO  glipiZIDE (GLUCOTROL) 5 MG tablet TAKE 1 TABLET BY MOUTH TWICE A DAY BEFORE MEALS 11/04/19   Dorena Dew, FNP  levothyroxine (SYNTHROID) 75 MCG tablet TAKE 1 TABLET BY MOUTH EVERY DAY Patient taking differently: Take 75 mcg by mouth daily before breakfast. 01/02/20   Dorena Dew, FNP  lidocaine-prilocaine (EMLA) cream Apply to affected area once Patient not taking: Reported on 05/30/2021 06/11/20   Volanda Napoleon, MD  metFORMIN (GLUCOPHAGE) 500 MG tablet Take 0.5 tablets (250 mg total) by mouth 2 (two) times daily with a meal. Patient taking differently: Take 500 mg by mouth 2 (two) times daily with a meal. 05/16/19   [name], FNP  methocarbamol (ROBAXIN) 500 MG tablet TAKE 1 TABLET BY MOUTH EVERY 12 HOURS AS NEEDED FOR MUSCLE SPASMS. 02/14/21   Volanda Napoleon, MD  metoprolol tartrate (LOPRESSOR) 25 MG tablet Take 1 tablet (25 mg total) by mouth 2 (two) times daily. 08/02/19   Tresa Garter, MD  Multiple Vitamin (MULTIVITAMIN WITH MINERALS) TABS tablet Take 1 tablet by mouth daily. Patient not taking: Reported on 05/30/2021 05/02/20   Raiford Noble Latif, DO  OVER THE COUNTER MEDICATION Take 5 mLs by mouth at bedtime. Mucosolve - used for cough (pt got from [place])    [provider]  pantoprazole (PROTONIX) 20 MG tablet Take 1 tablet (20 mg total) by mouth daily. 01/03/21   Deberah Pelton, NP  prednisoLONE acetate (PRED FORTE) 1 % ophthalmic suspension Place 1 drop into both eyes every Monday.    [provider]    Allergies    Nitroglycerin and Ampicillin  Review of Systems   Review of Systems  Constitutional:  Negative for chills, diaphoresis, fatigue and fever.  HENT:  Negative for congestion, rhinorrhea and sneezing.   Eyes: Negative.   Respiratory:  Negative for cough, chest tightness and shortness of breath.   Cardiovascular:  Negative for chest pain and leg swelling.  Gastrointestinal:   Positive for abdominal pain and nausea. Negative for blood in stool, diarrhea and vomiting.  Genitourinary:  Negative for difficulty urinating, flank pain, frequency and hematuria.  Musculoskeletal:  Negative for arthralgias and back pain.  Skin:  Negative for rash.  Neurological:  Negative for dizziness, speech difficulty, weakness, numbness and headaches.   Physical Exam Updated Vital Signs BP (!) 129/104 (BP Location: Left Arm)   Pulse 84   Temp 98.2 F (36.8 C) (Oral)   Resp 18   SpO2 99%   Physical Exam Constitutional:      Appearance: She is well-developed.  HENT:     Head: Normocephalic and atraumatic.  Eyes:     Pupils: Pupils are equal, round, and reactive to light.  Cardiovascular:     Rate and Rhythm: Normal rate and regular rhythm.     Heart sounds: Normal heart sounds.  Pulmonary:     Effort: Pulmonary effort is normal. No respiratory distress.     Breath sounds: Normal breath sounds. No wheezing or rales.  Chest:     Chest wall: No tenderness.  Abdominal:     General: Bowel sounds are normal.     Palpations: Abdomen is soft.     Tenderness: There is abdominal tenderness in the right upper quadrant and right lower quadrant. There is no guarding or rebound.     Comments: Patient has tenderness to her right mid and lower abdomen.  She has a left-sided colostomy with what feels like a peristomal hernia.  She has a large midline scar with some scabbing in the central area.  There is no bleeding or signs of infection.  No tenderness to these areas.  Musculoskeletal:        General: Normal range of motion.     Cervical back: Normal range of motion and neck supple.  Lymphadenopathy:     Cervical: No cervical adenopathy.  Skin:    General: Skin is warm and dry.     Findings: No rash.  Neurological:     Mental Status: She is alert and oriented to person, place, and time.    ED Results / Procedures / Treatments   Labs (all labs ordered are listed, but only abnormal  results are displayed) Labs Reviewed  COMPREHENSIVE METABOLIC PANEL - Abnormal; Notable for the following components:      Result Value   Glucose,  Bld 137 (*)    Total Protein 8.3 (*)    All other components within normal limits  URINALYSIS, ROUTINE W REFLEX MICROSCOPIC - Abnormal; Notable for the following components:   Leukocytes,Ua MODERATE (*)    All other components within normal limits  URINALYSIS, MICROSCOPIC (REFLEX) - Abnormal; Notable for the following components:   Bacteria, UA FEW (*)    All other components within normal limits  URINE CULTURE  LIPASE, BLOOD  CBC    EKG None  Radiology CT Abdomen Pelvis W Contrast  Result Date: 05/31/2021 CLINICAL DATA:  Right lower quadrant abdominal pain, suspect appendicitis. History of colorectal cancer status post resection and chemotherapy. EXAM: CT ABDOMEN AND PELVIS WITH CONTRAST TECHNIQUE: Multidetector CT imaging of the abdomen and pelvis was performed using the standard protocol following bolus administration of intravenous contrast. CONTRAST:  67m OMNIPAQUE IOHEXOL 350 MG/ML SOLN COMPARISON:  CT chest abdomen and pelvis 11/13/2020. FINDINGS: Lower chest: No acute abnormality. Hepatobiliary: The liver is enlarged, unchanged. There is fatty infiltration of the liver, also unchanged. 8 mm enhancing lesion in the right lobe image 1/35 appears unchanged from the prior examination. This is not seen on delayed imaging and is favored as a flash fill hemangioma. No new focal liver lesions are identified. The gallbladder is within normal limits. There is no biliary ductal dilatation. Pancreas: Unremarkable. No pancreatic ductal dilatation or surrounding inflammatory changes. Punctate calcifications in the region the pancreatic tail appear unchanged. Spleen: Normal in size without focal abnormality. Adrenals/Urinary Tract: Adrenal glands are unremarkable. Kidneys are normal, without renal calculi, focal lesion, or hydronephrosis. Bladder is  unremarkable. Stomach/Bowel: Patient is status post sigmoidectomy with left lower quadrant colostomy, unchanged from the prior examination. There is no evidence for bowel obstruction. Parastomal hernia containing nondilated bowel appears unchanged. A small hiatal hernia is again noted. Stomach is decompressed and within normal limits. Small bowel loops are within normal limits. The appendix is not visualized. Vascular/Lymphatic: Aortic atherosclerosis. No enlarged abdominal or pelvic lymph nodes. Reproductive: There is new heterogeneous lobulated mass in the left adnexa abutting the left ovary and uterus. There is some areas of enhancement. Conglomerate mass measures approximately 4.2 x 6.4 by 3.1 cm. This mass abuts remaining distal sigmoid colon with loss of fat plane as well as adjacent small bowel loops. Other: There is no ascites or free air. There is stable bulging of the anterior abdominal wall. Musculoskeletal: Multilevel degenerative changes affect the spine. No acute fractures are seen. No focal osseous lesions are identified. IMPRESSION: 1. New lobulated mass in the left adnexa abutting the uterus and left ovary. Findings are suspicious for metastatic disease, although primary ovarian neoplasm cannot be excluded. Differential diagnosis would also include tubo-ovarian abscess with pyosalpinx. This mass abuts multiple small bowel loops and adjacent distal sigmoid colon. 2. Stable left lower quadrant colostomy with peristomal hernia. No bowel obstruction. Electronically Signed   By: ARonney AstersM.D.   On: 05/31/2021 17:27    Procedures Procedures   Medications Ordered in ED Medications  iohexol (OMNIPAQUE) 350 MG/ML injection 100 mL (80 mLs Intravenous Contrast Given 05/31/21 1636)    ED Course  I have reviewed the triage vital signs and the nursing notes.  Pertinent labs & imaging results that were available during my care of the patient were reviewed by me and considered in my medical decision  making (see chart for details).    MDM Rules/Calculators/A&P  Patient presents with right-sided abdominal pain.  She had a CT scan which shows no acute abnormality in the right side of the abdomen.  On the left side there is an ovarian mass present.  She does not have any tenderness to this area.  However there was a question as to whether this could be a tubo-ovarian abscess.  I spoke to the radiologist who read the scan and she feels that it is most likely a neoplastic lesion.  She did not see any inflammatory changes around the area.  I was going to go ahead and get a pelvic ultrasound to better differentiate.  Patient was declining an internal ultrasound.  I had a long discussion with her regarding this with the Arabic interpreter.  She initially agreed to it but then refused it.  Images were obtained through the abdomen but they were insufficient.  I did advise her that while this is likely a neoplastic lesion, I cannot completely exclude an abscess.  She understands this through an interpreter explanation and does not want any further imaging.  I have a low suspicion that it is an abscess given that she has no tenderness, fever, elevated white count or other suggestions of an infectious process in this area.  The right side abdominal pain is unclear.  It could be musculoskeletal in nature.  Her labs are nonconcerning.  There was not any other signs of gallbladder disease.  Her urine was equivocal for infection.  She is not currently having any urinary symptoms.  It was sent for culture.  I will start her on antibiotics.  She was given a prescription for a few tablets of Vicodin.  She is scheduled to have surgery by Dr. Johney Maine to reverse the colostomy later this month.  I advised her and her family member to contact Dr. Clyda Greener office on Monday to let them know about the new mass and also to follow-up with Dr. Marin Olp.  Return precautions were given. Final Clinical Impression(s)  / ED Diagnoses Final diagnoses:  Right lower quadrant abdominal pain  Ovarian mass, left    Rx / DC Orders ED Discharge Orders          Ordered    HYDROcodone-acetaminophen (NORCO/VICODIN) 5-325 MG tablet  Every 4 hours PRN        05/31/21 1941             Malvin Johns, MD 05/31/21 1951

## 2021-05-31 NOTE — Patient Instructions (Addendum)
DUE TO COVID-19 ONLY ONE VISITOR IS ALLOWED TO COME WITH YOU AND STAY IN THE WAITING ROOM ONLY DURING PRE OP AND PROCEDURE.   **NO VISITORS ARE ALLOWED IN THE SHORT STAY AREA OR RECOVERY ROOM!!**  IF YOU WILL BE ADMITTED INTO THE HOSPITAL YOU ARE ALLOWED ONLY TWO SUPPORT PEOPLE DURING VISITATION HOURS ONLY (10AM -8PM)   The support person(s) may change daily. The support person(s) must pass our screening, gel in and out, and wear a mask at all times, including in the patient's room. Patients must also wear a mask when staff or their support person are in the room.  No visitors under the age of 50. Any visitor under the age of 79 must be accompanied by an adult.    COVID SWAB TESTING MUST BE COMPLETED ON:  06/18/21 **MUST PRESENT COMPLETED FORM AT TESTING SITE**    [facility] [place] (backside of the building) No appointment needed. Open 8am-3pm. You are not required to quarantine, however you are required to wear a well-fitted mask when you are out and around people not in your household.  Hand Hygiene often Do NOT share personal items Notify your provider if you are in close contact with someone who has COVID or you develop fever 100.4 or greater, new onset of sneezing, cough, sore throat, shortness of breath or body aches.   Your procedure is scheduled on: 03/20/21   Report to [facility] Main  Entrance   Report to Short Stay at 5:15 AM   [facility])   Call this number if you have problems the morning of surgery [phone]   Do not eat food :After Midnight.   May have liquids until 4:30 AM day of surgery  CLEAR LIQUID DIET  Foods Allowed                                                                     Foods Excluded  Water, Black Coffee and tea (no milk or creamer)          liquids that you cannot  Plain Jell-O in any flavor  (No red)                                   see through such as: Fruit ices (not with fruit pulp)                                             milk, soups, orange juice              Iced Popsicles (No red)                                               All solid food                                   Apple  juices Sports drinks like Gatorade (No red) Lightly seasoned clear broth or consume(fat free) Sugar   Drink 2 G2 drinks the night before surgery by 10pm.  Complete one G2 drink the morning of surgery 3 hours prior to scheduled surgery at 4:30.     The day of surgery:  Drink ONE (1) Pre-Surgery G2 by 4:30 am the morning of surgery. Drink in one sitting. Do not sip.  This drink was given to you during your hospital  pre-op appointment visit. Nothing else to drink after completing the  Pre-Surgery G2.          If you have questions, please contact your surgeon's office.     Oral Hygiene is also important to reduce your risk of infection.                                    Remember - BRUSH YOUR TEETH THE MORNING OF SURGERY WITH YOUR REGULAR TOOTHPASTE   Take these medicines the morning of surgery with A SIP OF WATER: Lipitor, Synthroid, Metoprolol Tartate, Pantoprazole.  DO NOT TAKE ANY ORAL DIABETIC MEDICATIONS DAY OF YOUR SURGERY  How to Manage Your Diabetes Before and After Surgery  Why is it important to control my blood sugar before and after surgery? Improving blood sugar levels before and after surgery helps healing and can limit problems. A way of improving blood sugar control is eating a healthy diet by:  Eating less sugar and carbohydrates  Increasing activity/exercise  Talking with your doctor about reaching your blood sugar goals High blood sugars (greater than 180 mg/dL) can raise your risk of infections and slow your recovery, so you will need to focus on controlling your diabetes during the weeks before surgery. Make sure that the doctor who takes care of your diabetes knows about your planned surgery including the date and location.  How do I manage my blood sugar before surgery? Check  your blood sugar at least 4 times a day, starting 2 days before surgery, to make sure that the level is not too high or low. Check your blood sugar the morning of your surgery when you wake up and every 2 hours until you get to the Short Stay unit. If your blood sugar is less than 70 mg/dL, you will need to treat for low blood sugar: Do not take insulin. Treat a low blood sugar (less than 70 mg/dL) with  cup of clear juice (cranberry or apple), 4 glucose tablets, OR glucose gel. Recheck blood sugar in 15 minutes after treatment (to make sure it is greater than 70 mg/dL). If your blood sugar is not greater than 70 mg/dL on recheck, call [phone] for further instructions. Report your blood sugar to the short stay nurse when you get to Short Stay.  If you are admitted to the hospital after surgery: Your blood sugar will be checked by the staff and you will probably be given insulin after surgery (instead of oral diabetes medicines) to make sure you have good blood sugar levels. The goal for blood sugar control after surgery is 80-180 mg/dL.   WHAT DO I DO ABOUT MY DIABETES MEDICATION?  Do not take oral diabetes medicines (pills) the morning of surgery.  THE DAY BEFORE SURGERY, take morning dose of Glipizide, no afternoon or evening dose. Take Metformin as prescribed.       THE MORNING OF SURGERY, do not take Glipizide or Metformin.  Reviewed and Endorsed by [facility] Patient Education Committee, August 2015                               You may not have any metal on your body including hair pins, jewelry, and body piercing             Do not wear make-up, lotions, powders, perfumes, or deodorant  Do not wear nail polish including gel and S&S, artificial/acrylic nails, or any other type of covering on natural nails including finger and toenails. If you have artificial nails, gel coating, etc. that needs to be removed by a nail salon please have this removed prior to surgery or surgery  may need to be canceled/ delayed if the surgeon/ anesthesia feels like they are unable to be safely monitored.   Do not shave  48 hours prior to surgery.    Do not bring valuables to the hospital. [place].   Please call your primary care doctor and ask for instructions regarding Aspirin before surgery.   Bring small overnight bag day of surgery.  Please read over the following fact sheets you were given: IF YOU HAVE QUESTIONS ABOUT YOUR PRE OP INSTRUCTIONS PLEASE CALL [facility] - Preparing for Surgery Before surgery, you can play an important role.  Because skin is not sterile, your skin needs to be as free of germs as possible.  You can reduce the number of germs on your skin by washing with CHG (chlorahexidine gluconate) soap before surgery.  CHG is an antiseptic cleaner which kills germs and bonds with the skin to continue killing germs even after washing. Please DO NOT use if you have an allergy to CHG or antibacterial soaps.  If your skin becomes reddened/irritated stop using the CHG and inform your nurse when you arrive at Short Stay. Do not shave (including legs and underarms) for at least 48 hours prior to the first CHG shower.  You may shave your face/neck.  Please follow these instructions carefully:  1.  Shower with CHG Soap the night before surgery and the  morning of surgery.  2.  If you choose to wash your hair, wash your hair first as usual with your normal  shampoo.  3.  After you shampoo, rinse your hair and body thoroughly to remove the shampoo.                             4.  Use CHG as you would any other liquid soap.  You can apply chg directly to the skin and wash.  Gently with a scrungie or clean washcloth.  5.  Apply the CHG Soap to your body ONLY FROM THE NECK DOWN.   Do   not use on face/ open                           Wound or open sores. Avoid contact with eyes, ears mouth and   genitals  (private parts).                       Wash face,  Genitals (private parts) with your normal soap.  6.  Wash thoroughly, paying special attention to the area where your    surgery  will be performed.  7.  Thoroughly rinse your body with warm water from the neck down.  8.  DO NOT shower/wash with your normal soap after using and rinsing off the CHG Soap.                9.  Pat yourself dry with a clean towel.            10.  Wear clean pajamas.            11.  Place clean sheets on your bed the night of your first shower and do not  sleep with pets. Day of Surgery : Do not apply any lotions/deodorants the morning of surgery.  Please wear clean clothes to the hospital/surgery center.  FAILURE TO FOLLOW THESE INSTRUCTIONS MAY RESULT IN THE CANCELLATION OF YOUR SURGERY  PATIENT SIGNATURE_________________________________  NURSE SIGNATURE__________________________________  ________________________________________________________________________   Adam Phenix  An incentive spirometer is a tool that can help keep your lungs clear and active. This tool measures how well you are filling your lungs with each breath. Taking long deep breaths may help reverse or decrease the chance of developing breathing (pulmonary) problems (especially infection) following: A long period of time when you are unable to move or be active. BEFORE THE PROCEDURE  If the spirometer includes an indicator to show your best effort, your nurse or respiratory therapist will set it to a desired goal. If possible, sit up straight or lean slightly forward. Try not to slouch. Hold the incentive spirometer in an upright position. INSTRUCTIONS FOR USE  Sit on the edge of your bed if possible, or sit up as far as you can in bed or on a chair. Hold the incentive spirometer in an upright position. Breathe out normally. Place the mouthpiece in your mouth and seal your lips tightly around it. Breathe in slowly and  as deeply as possible, raising the piston or the ball toward the top of the column. Hold your breath for 3-5 seconds or for as long as possible. Allow the piston or ball to fall to the bottom of the column. Remove the mouthpiece from your mouth and breathe out normally. Rest for a few seconds and repeat Steps 1 through 7 at least 10 times every 1-2 hours when you are awake. Take your time and take a few normal breaths between deep breaths. The spirometer may include an indicator to show your best effort. Use the indicator as a goal to work toward during each repetition. After each set of 10 deep breaths, practice coughing to be sure your lungs are clear. If you have an incision (the cut made at the time of surgery), support your incision when coughing by placing a pillow or rolled up towels firmly against it. Once you are able to get out of bed, walk around indoors and cough well. You may stop using the incentive spirometer when instructed by your caregiver.  RISKS AND COMPLICATIONS Take your time so you do not get dizzy or light-headed. If you are in pain, you may need to take or ask for pain medication before doing incentive spirometry. It is harder to take a deep breath if you are having pain. AFTER USE Rest and breathe slowly and easily. It can be helpful to keep track of a log of your progress. Your caregiver can provide you with a simple table to help with this. If  you are using the spirometer at home, follow these instructions: [place] IF:  You are having difficultly using the spirometer. You have trouble using the spirometer as often as instructed. Your pain medication is not giving enough relief while using the spirometer. You develop fever of 100.5 F (38.1 C) or higher. SEEK IMMEDIATE MEDICAL CARE IF:  You cough up bloody sputum that had not been present before. You develop fever of 102 F (38.9 C) or greater. You develop worsening pain at or near the incision site. MAKE  SURE YOU:  Understand these instructions. Will watch your condition. Will get help right away if you are not doing well or get worse. Document Released: 01/19/2007 Document Revised: 12/01/2011 Document Reviewed: 03/22/2007 [facility] Patient Information 2014 [place], [place].   ________________________________________________________________________

## 2021-05-31 NOTE — Telephone Encounter (Signed)
Call received from patient's niece, [name] stating that pt is SOB and is experiencing sharp pain to her right side times three days.  Dr. Marin Olp notified and order received for pt to go to the [facility] ER now.  Mary instructed to take pt to the [facility] ER now per order of Dr. Marin Olp.  [name] verbalized an understanding to take pt to the [facility] ER now.

## 2021-05-31 NOTE — ED Notes (Signed)
Spoke w/patient by way of translator and explained exam.  Patient refused transvaginal but is ok w/transabdominal.  Informed Dr. Tamera Punt who will speak with patient

## 2021-06-01 LAB — URINE CULTURE: Culture: NO GROWTH

## 2021-06-03 ENCOUNTER — Encounter (HOSPITAL_COMMUNITY)
Admission: RE | Admit: 2021-06-03 | Discharge: 2021-06-03 | Disposition: A | Discharge status: Home / Self Care | Payer: Medicaid Other | Source: Ambulatory Visit | Attending: Surgery | Admitting: Surgery

## 2021-06-03 ENCOUNTER — Other Ambulatory Visit: Payer: Self-pay

## 2021-06-03 ENCOUNTER — Encounter (HOSPITAL_COMMUNITY): Payer: Self-pay

## 2021-06-03 ENCOUNTER — Other Ambulatory Visit (HOSPITAL_COMMUNITY): Payer: Self-pay

## 2021-06-03 DIAGNOSIS — Z01812 Encounter for preprocedural laboratory examination: Secondary | ICD-10-CM | POA: Insufficient documentation

## 2021-06-03 LAB — CBC
HCT: 39.9 % (ref 36.0–46.0)
Hemoglobin: 12.6 g/dL (ref 12.0–15.0)
MCH: 29.7 pg (ref 26.0–34.0)
MCHC: 31.6 g/dL (ref 30.0–36.0)
MCV: 94.1 fL (ref 80.0–100.0)
Platelets: 278 10*3/uL (ref 150–400)
RBC: 4.24 MIL/uL (ref 3.87–5.11)
RDW: 13.2 % (ref 11.5–15.5)
WBC: 9 10*3/uL (ref 4.0–10.5)
nRBC: 0 % (ref 0.0–0.2)

## 2021-06-03 LAB — GLUCOSE, CAPILLARY: Glucose-Capillary: 257 mg/dL — ABNORMAL HIGH (ref 70–99)

## 2021-06-03 LAB — BASIC METABOLIC PANEL
Anion gap: 10 (ref 5–15)
BUN: 17 mg/dL (ref 8–23)
CO2: 24 mmol/L (ref 22–32)
Calcium: 9.3 mg/dL (ref 8.9–10.3)
Chloride: 102 mmol/L (ref 98–111)
Creatinine, Ser: 0.7 mg/dL (ref 0.44–1.00)
GFR, Estimated: >60 mL/min (ref >60)
Glucose, Bld: 238 mg/dL — ABNORMAL HIGH (ref 70–99)
Potassium: 4.4 mmol/L (ref 3.5–5.1)
Sodium: 136 mmol/L (ref 135–145)

## 2021-06-03 LAB — HEMOGLOBIN A1C
Hgb A1c MFr Bld: 6.9 % — ABNORMAL HIGH (ref 4.8–5.6)
Mean Plasma Glucose: 151.33 mg/dL

## 2021-06-04 ENCOUNTER — Encounter: Payer: Self-pay | Admitting: *Deleted

## 2021-06-04 NOTE — Progress Notes (Signed)
Dr Marin Olp requests that I schedule this patient for follow up of possible recurrence. Called and spoke to patient's niece, [name]. She is aware of appointment time, date and location.   Oncology Nurse Navigator Documentation  Oncology Nurse Navigator Flowsheets 06/04/2021  Navigator Location [facility]  Navigator Encounter Type Telephone  Telephone Appt Confirmation/Clarification;Education  Patient Visit Type MedOnc  Treatment Phase Other  Barriers/Navigation Needs Coordination of Care;Education  Interventions Education;Coordination of Care  Acuity Level 2-Minimal Needs (1-2 Barriers Identified)  Coordination of Care Appts  Education Method Verbal  Support Groups/Services Friends and Family  Time Spent with Patient 78

## 2021-06-05 ENCOUNTER — Telehealth: Payer: Self-pay | Admitting: Oncology

## 2021-06-05 NOTE — Telephone Encounter (Signed)
Called [name] (niece) and scheduled new patient appointment with Dr. Berline Lopes on 06/07/21 at 11:15.  She verbalized understanding and agreement.
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
## 2021-06-07 ENCOUNTER — Encounter: Payer: Self-pay | Admitting: Gynecologic Oncology

## 2021-06-07 ENCOUNTER — Telehealth: Payer: Self-pay

## 2021-06-07 ENCOUNTER — Inpatient Hospital Stay: Payer: Medicaid Other | Attending: Gynecologic Oncology | Admitting: Gynecologic Oncology

## 2021-06-07 ENCOUNTER — Inpatient Hospital Stay: Payer: Medicaid Other

## 2021-06-07 ENCOUNTER — Other Ambulatory Visit: Payer: Self-pay

## 2021-06-07 VITALS — BP 110/75 | HR 84 | Temp 97.8°F | Resp 16 | Ht 62.0 in | Wt 172.0 lb

## 2021-06-07 DIAGNOSIS — D3912 Neoplasm of uncertain behavior of left ovary: Secondary | ICD-10-CM | POA: Diagnosis present

## 2021-06-07 DIAGNOSIS — K219 Gastro-esophageal reflux disease without esophagitis: Secondary | ICD-10-CM | POA: Diagnosis not present

## 2021-06-07 DIAGNOSIS — C772 Secondary and unspecified malignant neoplasm of intra-abdominal lymph nodes: Secondary | ICD-10-CM

## 2021-06-07 DIAGNOSIS — I10 Essential (primary) hypertension: Secondary | ICD-10-CM | POA: Diagnosis not present

## 2021-06-07 DIAGNOSIS — C187 Malignant neoplasm of sigmoid colon: Secondary | ICD-10-CM | POA: Insufficient documentation

## 2021-06-07 DIAGNOSIS — K435 Parastomal hernia without obstruction or  gangrene: Secondary | ICD-10-CM | POA: Insufficient documentation

## 2021-06-07 DIAGNOSIS — D510 Vitamin B12 deficiency anemia due to intrinsic factor deficiency: Secondary | ICD-10-CM | POA: Diagnosis not present

## 2021-06-07 DIAGNOSIS — R109 Unspecified abdominal pain: Secondary | ICD-10-CM | POA: Insufficient documentation

## 2021-06-07 DIAGNOSIS — Z79899 Other long term (current) drug therapy: Secondary | ICD-10-CM | POA: Diagnosis not present

## 2021-06-07 DIAGNOSIS — E785 Hyperlipidemia, unspecified: Secondary | ICD-10-CM | POA: Diagnosis not present

## 2021-06-07 DIAGNOSIS — N9489 Other specified conditions associated with female genital organs and menstrual cycle: Secondary | ICD-10-CM

## 2021-06-07 DIAGNOSIS — E119 Type 2 diabetes mellitus without complications: Secondary | ICD-10-CM | POA: Insufficient documentation

## 2021-06-07 DIAGNOSIS — I471 Supraventricular tachycardia: Secondary | ICD-10-CM | POA: Diagnosis not present

## 2021-06-07 LAB — CMP (CANCER CENTER ONLY)
ALT: 23 U/L (ref 0–44)
AST: 23 U/L (ref 15–41)
Albumin: 4.1 g/dL (ref 3.5–5.0)
Alkaline Phosphatase: 52 U/L (ref 38–126)
Anion gap: 12 (ref 5–15)
BUN: 12 mg/dL (ref 8–23)
CO2: 23 mmol/L (ref 22–32)
Calcium: 10 mg/dL (ref 8.9–10.3)
Chloride: 103 mmol/L (ref 98–111)
Creatinine: 0.74 mg/dL (ref 0.44–1.00)
GFR, Estimated: >60 mL/min (ref >60)
Glucose, Bld: 143 mg/dL — ABNORMAL HIGH (ref 70–99)
Potassium: 4.5 mmol/L (ref 3.5–5.1)
Sodium: 138 mmol/L (ref 135–145)
Total Bilirubin: 0.3 mg/dL (ref 0.3–1.2)
Total Protein: 7.8 g/dL (ref 6.5–8.1)

## 2021-06-07 LAB — CBC WITH DIFFERENTIAL (CANCER CENTER ONLY)
Abs Immature Granulocytes: 0.03 10*3/uL (ref 0.00–0.07)
Basophils Absolute: 0.1 10*3/uL (ref 0.0–0.1)
Basophils Relative: 1 %
Eosinophils Absolute: 0.2 10*3/uL (ref 0.0–0.5)
Eosinophils Relative: 2 %
HCT: 38.7 % (ref 36.0–46.0)
Hemoglobin: 12.7 g/dL (ref 12.0–15.0)
Immature Granulocytes: 0 %
Lymphocytes Relative: 26 %
Lymphs Abs: 2.4 10*3/uL (ref 0.7–4.0)
MCH: 29.8 pg (ref 26.0–34.0)
MCHC: 32.8 g/dL (ref 30.0–36.0)
MCV: 90.8 fL (ref 80.0–100.0)
Monocytes Absolute: 0.8 10*3/uL (ref 0.1–1.0)
Monocytes Relative: 9 %
Neutro Abs: 5.9 10*3/uL (ref 1.7–7.7)
Neutrophils Relative %: 62 %
Platelet Count: 294 10*3/uL (ref 150–400)
RBC: 4.26 MIL/uL (ref 3.87–5.11)
RDW: 13.2 % (ref 11.5–15.5)
WBC Count: 9.3 10*3/uL (ref 4.0–10.5)
nRBC: 0 % (ref 0.0–0.2)

## 2021-06-07 LAB — CEA (IN HOUSE-CHCC): CEA (CHCC-In House): 19.99 ng/mL — ABNORMAL HIGH (ref 0.00–5.00)

## 2021-06-07 LAB — LACTATE DEHYDROGENASE: LDH: 186 U/L (ref 98–192)

## 2021-06-07 NOTE — Telephone Encounter (Signed)
Spoke with patients niece [name], explained that Dr. Berline Lopes would like [facility] to have an MRI to better visualize things. MRI is scheduled for 06/15/21 at the [facility] [place] at 12[place]. [name] is currently working and will call Monday when she has more time to talk and get the details of the appointment. Provided her with office number of [phone].

## 2021-06-07 NOTE — Patient Instructions (Signed)
It was a pleasure meeting you today. We will get your blood work today and I will speak with Dr. Marin Olp after you see him next week. Once he and I have spoken, I will call your niece to discuss my recommendations for next steps.  There are two separate issues: a mass in your pelvis (unclear if this is recurrent cancer or a mass on your ovary) and a mass that I am see in your pelvic exam (I could not open the speculum enough to tell - this may be related to your cervix or your uterus).

## 2021-06-11 ENCOUNTER — Inpatient Hospital Stay: Payer: Medicaid Other

## 2021-06-11 ENCOUNTER — Encounter: Payer: Self-pay | Admitting: Hematology & Oncology

## 2021-06-11 ENCOUNTER — Inpatient Hospital Stay: Payer: Medicaid Other | Admitting: Hematology & Oncology

## 2021-06-11 ENCOUNTER — Other Ambulatory Visit: Payer: Self-pay

## 2021-06-11 VITALS — BP 144/73 | HR 107 | Temp 98.3°F | Resp 18 | Wt 177.0 lb

## 2021-06-11 VITALS — BP 144/73 | HR 107 | Temp 98.3°F | Resp 17 | Ht 62.0 in | Wt 177.0 lb

## 2021-06-11 DIAGNOSIS — C187 Malignant neoplasm of sigmoid colon: Secondary | ICD-10-CM | POA: Diagnosis not present

## 2021-06-11 DIAGNOSIS — C772 Secondary and unspecified malignant neoplasm of intra-abdominal lymph nodes: Secondary | ICD-10-CM

## 2021-06-11 DIAGNOSIS — R112 Nausea with vomiting, unspecified: Secondary | ICD-10-CM

## 2021-06-11 DIAGNOSIS — D509 Iron deficiency anemia, unspecified: Secondary | ICD-10-CM

## 2021-06-11 DIAGNOSIS — D3912 Neoplasm of uncertain behavior of left ovary: Secondary | ICD-10-CM | POA: Diagnosis not present

## 2021-06-11 LAB — CBC WITH DIFFERENTIAL (CANCER CENTER ONLY)
Abs Immature Granulocytes: 0.05 10*3/uL (ref 0.00–0.07)
Basophils Absolute: 0 10*3/uL (ref 0.0–0.1)
Basophils Relative: 1 %
Eosinophils Absolute: 0.1 10*3/uL (ref 0.0–0.5)
Eosinophils Relative: 2 %
HCT: 37.1 % (ref 36.0–46.0)
Hemoglobin: 12.1 g/dL (ref 12.0–15.0)
Immature Granulocytes: 1 %
Lymphocytes Relative: 25 %
Lymphs Abs: 2 10*3/uL (ref 0.7–4.0)
MCH: 30.1 pg (ref 26.0–34.0)
MCHC: 32.6 g/dL (ref 30.0–36.0)
MCV: 92.3 fL (ref 80.0–100.0)
Monocytes Absolute: 0.6 10*3/uL (ref 0.1–1.0)
Monocytes Relative: 7 %
Neutro Abs: 5.2 10*3/uL (ref 1.7–7.7)
Neutrophils Relative %: 64 %
Platelet Count: 252 10*3/uL (ref 150–400)
RBC: 4.02 MIL/uL (ref 3.87–5.11)
RDW: 13.1 % (ref 11.5–15.5)
WBC Count: 8 10*3/uL (ref 4.0–10.5)
nRBC: 0 % (ref 0.0–0.2)

## 2021-06-11 LAB — CMP (CANCER CENTER ONLY)
ALT: 18 U/L (ref 0–44)
AST: 17 U/L (ref 15–41)
Albumin: 4.1 g/dL (ref 3.5–5.0)
Alkaline Phosphatase: 43 U/L (ref 38–126)
Anion gap: 14 (ref 5–15)
BUN: 16 mg/dL (ref 8–23)
CO2: 24 mmol/L (ref 22–32)
Calcium: 9.2 mg/dL (ref 8.9–10.3)
Chloride: 99 mmol/L (ref 98–111)
Creatinine: 0.71 mg/dL (ref 0.44–1.00)
GFR, Estimated: >60 mL/min (ref >60)
Glucose, Bld: 242 mg/dL — ABNORMAL HIGH (ref 70–99)
Potassium: 3.6 mmol/L (ref 3.5–5.1)
Sodium: 137 mmol/L (ref 135–145)
Total Bilirubin: 0.4 mg/dL (ref 0.3–1.2)
Total Protein: 7.1 g/dL (ref 6.5–8.1)

## 2021-06-11 MED ORDER — SODIUM CHLORIDE 0.9 % IV SOLN
10.0000 mg | Freq: Once | INTRAVENOUS | Status: AC
  Administered 2021-06-11: 10 mg via INTRAVENOUS
  Filled 2021-06-11: qty 10

## 2021-06-11 MED ORDER — CYANOCOBALAMIN 1000 MCG/ML IJ SOLN
1000.0000 ug | Freq: Once | INTRAMUSCULAR | Status: AC
  Administered 2021-06-11: 1000 ug via INTRAMUSCULAR
  Filled 2021-06-11: qty 1

## 2021-06-11 MED ORDER — HYDROMORPHONE HCL 1 MG/ML IJ SOLN: 1.0000 mg | Freq: Once | INTRAMUSCULAR | Status: DC

## 2021-06-11 MED ORDER — SODIUM CHLORIDE 0.9 % IV SOLN: 40.0000 mg | Freq: Once | INTRAVENOUS | Status: DC

## 2021-06-11 MED ORDER — FAMOTIDINE 20 MG IN NS 100 ML IVPB
20.0000 mg | INTRAVENOUS | Status: AC
  Administered 2021-06-11 (×2): 20 mg via INTRAVENOUS
  Filled 2021-06-11: qty 100

## 2021-06-11 MED ORDER — SODIUM CHLORIDE 0.9 % IV SOLN: 8.0000 mg | Freq: Once | INTRAVENOUS | Status: DC

## 2021-06-11 MED ORDER — SODIUM CHLORIDE 0.9% FLUSH
10.0000 mL | Freq: Once | INTRAVENOUS | Status: AC
  Administered 2021-06-11: 10 mL via INTRAVENOUS

## 2021-06-11 MED ORDER — HYDROMORPHONE HCL 1 MG/ML IJ SOLN
1.0000 mg | Freq: Once | INTRAMUSCULAR | Status: AC
Start: 2021-06-11 — End: 2021-06-11
  Administered 2021-06-11: 1 mg via INTRAVENOUS
  Filled 2021-06-11: qty 1

## 2021-06-11 MED ORDER — HEPARIN SOD (PORK) LOCK FLUSH 100 UNIT/ML IV SOLN
500.0000 [IU] | Freq: Once | INTRAVENOUS | Status: AC
  Administered 2021-06-11: 500 [IU] via INTRAVENOUS

## 2021-06-11 MED ORDER — HYDROCODONE-ACETAMINOPHEN 7.5-325 MG/15ML PO SOLN: 5.0000 mL | Freq: Four times a day (QID) | ORAL | 0 refills | Status: DC | PRN

## 2021-06-11 MED ORDER — SODIUM CHLORIDE 0.9 % IV SOLN: INTRAVENOUS | Status: DC

## 2021-06-11 MED ORDER — ONDANSETRON HCL 4 MG/2ML IJ SOLN
8.0000 mg | Freq: Once | INTRAMUSCULAR | Status: AC
  Administered 2021-06-11: 8 mg via INTRAVENOUS
  Filled 2021-06-11: qty 4

## 2021-06-11 NOTE — Progress Notes (Signed)
Hematology and Oncology Follow Up Visit  Bailey Walker [id number] July 16, 1946 75 y.o. 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  Unfortunately, looks like he may have a problem.  I am suspect that she is going to end up having recurrent colon cancer.  She was having some abdominal pain recently.  She went to the emergency room.  She had a CT scan done.  The CT scan showed that there was a mass measuring 4.2 x 6.4 x 3.1 cm.  There is appear to be in the left adnexa.  It appeared to abut the distal sigmoid colon.  There is no evidence of any abnormalities elsewhere.  She then had a pelvic ultrasound.  Again this confirmed a left adnexal mass measuring 3.4 x 3.1 x 6.5 cm.  She was seen by Dr. Berline Lopes of gynecologic oncology.  Dr. Berline Lopes sent some tumor markers.  Unfortunately, the CEA level is not elevated.  Her last CEA level in April was normal now it is 20.   Again, have to worry that this is recurrence of her malignancy.  She completed her adjuvant chemotherapy in January.  To have a recurrence so soon is certainly quite disappointing.  She had a really hard time with initial surgery.  She has a colostomy.  She had poor healing of the laparotomy scar.  There is has healed but is still somewhat tenuous.  I think she is seeing the surgeon.  He is not sure if he can operate on this and resected out.  I think that a PET scan would be helpful.  We can see if there is any disease elsewhere and then I may help [place] decide as to whether or not we might be aggressive..  She is having abdominal pain.  She is not taking any kind of pain medication.  Her colostomy is working okay.  She  has some pain at the colostomy site.  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  She is urinating without difficulties.  She is having no problems with eating.  She is having no nausea or vomiting.  There is no bleeding.  There is no fever.  There is no leg swelling.  Currently, I would have to say that her performance status is ECOG 1.    Medications:  Allergies as of 06/11/2021       Reactions   Nitroglycerin Nausea And Vomiting   Ampicillin Other (See Comments)   Per allergy test        Medication List        Accurate as of June 11, 2021  4:13 PM. If you have any questions, ask your nurse or doctor.          acetaminophen 500 MG tablet Commonly known as: TYLENOL Take 500 mg by mouth in the morning and at bedtime.   alendronate 70 MG tablet Commonly known as: FOSAMAX Take 70 mg by mouth every Sunday.   aspirin EC 81 MG tablet Take 81 mg by mouth daily. Swallow whole.   atorvastatin 10 MG tablet Commonly known as: LIPITOR Take 2 tablets (20 mg total) by mouth daily.  cephALEXin 500 MG capsule Commonly known as: KEFLEX Take 1 capsule (500 mg total) by mouth 2 (two) times daily.   [employer] Gentle Laxative 5 MG EC tablet Generic drug: bisacodyl Take 5 mg by mouth daily as needed.   [employer] Purelax 17 GM/SCOOP powder Generic drug: polyethylene glycol powder Take 17 g by mouth daily as needed.   feeding supplement Liqd Take 237 mLs by mouth 3 (three) times daily between meals. What changed: when to take this   glipiZIDE 5 MG tablet Commonly known as: GLUCOTROL TAKE 1 TABLET BY MOUTH TWICE A DAY BEFORE MEALS   HYDROcodone-acetaminophen 5-325 MG tablet Commonly known as: NORCO/VICODIN Take 1 tablet by mouth every 4 (four) hours as needed.   levothyroxine 75 MCG tablet Commonly known as: SYNTHROID TAKE 1 TABLET BY MOUTH EVERY DAY What changed: when to take this   lidocaine-prilocaine cream Commonly known as: EMLA Apply to affected area  once   metFORMIN 500 MG tablet Commonly known as: GLUCOPHAGE Take 0.5 tablets (250 mg total) by mouth 2 (two) times daily with a meal. What changed: how much to take   methocarbamol 500 MG tablet Commonly known as: ROBAXIN TAKE 1 TABLET BY MOUTH EVERY 12 HOURS AS NEEDED FOR MUSCLE SPASMS.   metoprolol tartrate 25 MG tablet Commonly known as: LOPRESSOR Take 1 tablet (25 mg total) by mouth 2 (two) times daily.   multivitamin with minerals Tabs tablet Take 1 tablet by mouth daily.   OVER THE COUNTER MEDICATION Take 5 mLs by mouth at bedtime. Mucosolve - used for cough (pt got from [place])   pantoprazole 20 MG tablet Commonly known as: Protonix Take 1 tablet (20 mg total) by mouth daily.   prednisoLONE acetate 1 % ophthalmic suspension Commonly known as: PRED FORTE Place 1 drop into both eyes every Monday.        Allergies:  Allergies  Allergen Reactions   Nitroglycerin Nausea And Vomiting   Ampicillin Other (See Comments)    Per allergy test    Past Medical History, Surgical history, Social history, and Family History were reviewed and updated.  Review of Systems: Review of Systems  HENT: Negative.    Eyes: Negative.   Respiratory:  Positive for shortness of breath.   Cardiovascular: Negative.   Gastrointestinal:  Positive for abdominal pain.  Genitourinary: Negative.   Musculoskeletal:  Positive for back pain.  Skin: Negative.   Neurological: Negative.   Endo/Heme/Allergies: Negative.   Psychiatric/Behavioral: Negative.      Physical Exam:  weight is 177 lb (80.3 kg). Her oral temperature is 98.3 F (36.8 C). Her blood pressure is 144/73 (abnormal) and her pulse is 107 (abnormal). Her respiration is 18 and oxygen saturation is 97%.   Wt Readings from Last 3 Encounters:  06/11/21 177 lb (80.3 kg)  06/11/21 177 lb (80.3 kg)  06/07/21 172 lb (78 kg)    Physical Exam Vitals reviewed.  HENT:     Head: Normocephalic and atraumatic.  Eyes:     Pupils:  Pupils are equal, round, and reactive to light.  Cardiovascular:     Rate and Rhythm: Normal rate and regular rhythm.     Heart sounds: Normal heart sounds.  Pulmonary:     Effort: Pulmonary effort is normal.     Breath sounds: Normal breath sounds.  Abdominal:     General: Bowel sounds are normal.     Palpations: Abdomen is soft.     Comments: Her abdomen is slightly distended.  There is no  obvious fluid wave.  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  She still has a dressing over the abdominal laparotomy scar.  She does not have any obvious erythema or warmth.  There is no exudate coming from the scars.  She has decent bowel sounds.  There is no palpable liver or spleen tip.  Musculoskeletal:        General: No tenderness or deformity. Normal range of motion.     Cervical back: Normal range of motion.  Lymphadenopathy:     Cervical: No cervical adenopathy.  Skin:    General: Skin is warm and dry.     Findings: No erythema or rash.  Neurological:     Mental Status: She is alert and oriented to person, place, and time.  Psychiatric:        Behavior: Behavior normal.        Thought Content: Thought content normal.        Judgment: Judgment normal.     Lab Results  Component Value Date   WBC 8.0 06/11/2021   HGB 12.1 06/11/2021   HCT 37.1 06/11/2021   MCV 92.3 06/11/2021   PLT 252 06/11/2021   Lab Results  Component Value Date   FERRITIN 96 07/04/2020   IRON 55 07/04/2020   TIBC 335 07/04/2020   UIBC 280 07/04/2020   IRONPCTSAT 17 (L) 07/04/2020   Lab Results  Component Value Date   RETICCTPCT 1.9 01/05/2020   RBC 4.02 06/11/2021   No results found for: KPAFRELGTCHN, LAMBDASER, KAPLAMBRATIO No results found for: IGGSERUM, IGA, IGMSERUM No results found for: Odetta Pink, SPEI   Chemistry      Component Value Date/Time   NA 137 06/11/2021 0952   NA 139 07/12/2019 1110   K 3.6 06/11/2021 0952   CL 99  06/11/2021 0952   CO2 24 06/11/2021 0952   BUN 16 06/11/2021 0952   BUN 15 07/12/2019 1110   CREATININE 0.71 06/11/2021 0952   CREATININE 0.74 01/08/2017 1100      Component Value Date/Time   CALCIUM 9.2 06/11/2021 0952   ALKPHOS 43 06/11/2021 0952   AST 17 06/11/2021 0952   ALT 18 06/11/2021 0952   BILITOT 0.4 06/11/2021 0952       Impression and Plan: Bailey Walker is a very pleasant 75 yo Kurdish female with stage IIIB ([id number]) 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   I really hate that she has a mass in the pelvic area.  I have to believe that this is going to be colon cancer.  I guess the real issue is whether or not this can be resected out and "debulked" before we give her systemic therapy.  If she is not I be able to be debulked, and we got had to get a biopsy.  I really need to get material so we can send molecular markers.  I think this will be incredibly helpful.  I know this is going to be a "team effort."  I will have to get with general surgery.  I will have to get with gynecologic oncology.  We will see what the PET scan shows.  We will send in some pain medication for her.  We will see if some liquid pain medicine may help her out.  Again, I think a PET scan will be critical.  We will see what her tumor markers show that we run.  This is incredibly complex.  I think one of the issues is the poor healing that she had with her initial surgery.  This was very difficult for her.  We will plan to have her come back once we get the PET scan results.   Volanda Napoleon, MD 9/20/20224:13 PM

## 2021-06-11 NOTE — Addendum Note (Signed)
Addended by: Volanda Napoleon on: 06/11/2021 06:13 PM   Modules accepted: Orders

## 2021-06-11 NOTE — Patient Instructions (Addendum)

## 2021-06-12 ENCOUNTER — Telehealth: Payer: Self-pay

## 2021-06-12 LAB — CA 125: Cancer Antigen (CA) 125: 11 U/mL (ref 0.0–38.1)

## 2021-06-12 NOTE — Telephone Encounter (Signed)
No 06/11/21 LOS noted but chart note states "We will plan to have her come back once we get the PET scan results."  PET order noted for [facility]    Bailey Walker

## 2021-06-15 ENCOUNTER — Ambulatory Visit (HOSPITAL_BASED_OUTPATIENT_CLINIC_OR_DEPARTMENT_OTHER)
Admission: RE | Admit: 2021-06-15 | Discharge: 2021-06-15 | Disposition: A | Discharge status: Home / Self Care | Payer: Medicaid Other | Source: Ambulatory Visit | Attending: Gynecologic Oncology | Admitting: Gynecologic Oncology

## 2021-06-15 ENCOUNTER — Other Ambulatory Visit: Payer: Self-pay

## 2021-06-15 DIAGNOSIS — N9489 Other specified conditions associated with female genital organs and menstrual cycle: Secondary | ICD-10-CM | POA: Insufficient documentation

## 2021-06-15 MED ORDER — GADOBUTROL 1 MMOL/ML IV SOLN
8.0000 mL | Freq: Once | INTRAVENOUS | Status: AC | PRN
  Administered 2021-06-15: 8 mL via INTRAVENOUS

## 2021-06-18 ENCOUNTER — Other Ambulatory Visit (HOSPITAL_COMMUNITY): Payer: Self-pay

## 2021-06-20 ENCOUNTER — Encounter (HOSPITAL_COMMUNITY): Admission: RE | Payer: Self-pay | Source: Home / Self Care

## 2021-06-20 ENCOUNTER — Inpatient Hospital Stay (HOSPITAL_COMMUNITY): Admission: RE | Admit: 2021-06-20 | Payer: Medicaid Other | Source: Home / Self Care | Admitting: Surgery

## 2021-06-20 ENCOUNTER — Telehealth: Payer: Self-pay

## 2021-06-20 SURGERY — CLOSURE, COLOSTOMY, ROBOT-ASSISTED
Anesthesia: General

## 2021-06-20 NOTE — Telephone Encounter (Signed)
Spoke with patients niece [name] regarding Mri results. Per Dr. Berline Lopes we will wait for PET scan results and make a plan from there.  She verbalized understanding and will call with any questions.

## 2021-06-20 NOTE — Telephone Encounter (Signed)
Attempted to speak with Basista with the assistance of interpreter Bridget Hartshorn [phone]). Unable to reach patient.

## 2021-06-21 ENCOUNTER — Other Ambulatory Visit (HOSPITAL_COMMUNITY): Payer: Self-pay

## 2021-06-24 ENCOUNTER — Other Ambulatory Visit: Payer: Self-pay

## 2021-06-24 ENCOUNTER — Ambulatory Visit (HOSPITAL_COMMUNITY)
Admission: RE | Admit: 2021-06-24 | Discharge: 2021-06-24 | Disposition: A | Discharge status: Home / Self Care | Payer: Medicaid Other | Source: Ambulatory Visit | Attending: Hematology & Oncology | Admitting: Hematology & Oncology

## 2021-06-24 DIAGNOSIS — C187 Malignant neoplasm of sigmoid colon: Secondary | ICD-10-CM | POA: Insufficient documentation

## 2021-06-24 DIAGNOSIS — C772 Secondary and unspecified malignant neoplasm of intra-abdominal lymph nodes: Secondary | ICD-10-CM | POA: Insufficient documentation

## 2021-06-24 LAB — GLUCOSE, CAPILLARY: Glucose-Capillary: 158 mg/dL — ABNORMAL HIGH (ref 70–99)

## 2021-06-24 MED ORDER — FLUDEOXYGLUCOSE F - 18 (FDG) INJECTION
9.2000 | Freq: Once | INTRAVENOUS | Status: AC
  Administered 2021-06-24: 8.76 via INTRAVENOUS

## 2021-06-25 ENCOUNTER — Telehealth: Payer: Self-pay | Admitting: Gynecologic Oncology

## 2021-06-25 NOTE — Telephone Encounter (Signed)
I called and spoke with the patient's niece.  Discussed findings on PET scan and that I do not think that surgery is the next step.  I let her know that I will reach out to Dr. Marin Olp.  Given what appears to be new metastatic disease in her lungs, I suspect that he will recommend proceeding with chemotherapy.  Jeral Pinch MD Gynecologic Oncology

## 2021-06-27 ENCOUNTER — Other Ambulatory Visit: Payer: Self-pay | Admitting: Hematology & Oncology

## 2021-06-27 DIAGNOSIS — C187 Malignant neoplasm of sigmoid colon: Secondary | ICD-10-CM

## 2021-06-27 DIAGNOSIS — C772 Secondary and unspecified malignant neoplasm of intra-abdominal lymph nodes: Secondary | ICD-10-CM

## 2021-07-01 ENCOUNTER — Encounter (HOSPITAL_COMMUNITY): Payer: Self-pay | Admitting: Radiology

## 2021-07-01 NOTE — Progress Notes (Signed)
Bailey Walker Legal Sex  Female DOB  1945-10-18 SSN  [id number] Address  [address]  [place] [address] Phone  [phone] (Home) *Preferred*  [phone] (Mobile)    RE: CT Biopsy Received: Today Arne Cleveland, MD  [facility] [place]   CT core bx RLQ intraperit nodule  See PET Im 139 Se 4   [name]        Previous Messages   ----- Message -----  From: Garth Bigness D  Sent: 07/01/2021   9:43 AM EDT  To: Arne Cleveland, MD  Subject: FW: CT Biopsy                                   Still need review.  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, recurrent colon cancer -- needs biopsies for molecular markers   History:  NM PET, [place], MR, CT in computer   Provider:  Volanda Napoleon   Provider Contact:  [phone]    From: Arne Cleveland, MD  Sent: 06/26/2021  12:20 PM EDT  To: April H Pait  Subject: RE: request                                     IF you are asking the rads to pick from this list, you'll need to supply the list with each request since we cant remember all these   Also, there are no [place] codes listed   Thx  [name]    ----- Message -----  From: Sheldon Silvan, April H  Sent: 06/26/2021  12:09 PM EDT  To: Martin Majestic Mast, *  Subject: request                                         Hello,   In order to reduce denials, when you submit your review could you please state which exam below is the correct one, if approved to be done under CT Guidance.   CT guided aspiration N/S 10030  CT guided Needle placement [id number]  CT guided Bone Marrow biopsy and aspiration 96759  CT guided Lung Mass biopsy 32408  CT Guided Liver mass biopsy 47000  CT Guided Abdominal Mass Biopsy 49180 retroperitoneal  CT  Guided Drainage by Percutaneous Catheter 49405  CT Guided Retroperitoneal drainage by Percutaneous Catheter 49406  CT Guided Incision drain 10140  CT Guided Bone Biopsy Superficial 20220  CT Guided Bone Biopsy Deep 20225  CT Guided Liver tumor ablation [facility] 16384  CT Guided Ablate Liver Tumor (S) cryoablation 66599  CT guided Renal Tumor Ablation Unilateral 50593  CT Guided Cryoablation Lung Microwave 32998  CT Guided Bone Cryoablation 20983  CT guided Renal Biopsy 50200

## 2021-07-04 ENCOUNTER — Other Ambulatory Visit: Payer: Self-pay | Admitting: Radiology

## 2021-07-05 ENCOUNTER — Ambulatory Visit (HOSPITAL_COMMUNITY)
Admission: RE | Admit: 2021-07-05 | Discharge: 2021-07-05 | Disposition: A | Discharge status: Home / Self Care | Payer: Medicaid Other | Source: Ambulatory Visit | Attending: Hematology & Oncology | Admitting: Hematology & Oncology

## 2021-07-05 ENCOUNTER — Other Ambulatory Visit: Payer: Self-pay

## 2021-07-05 DIAGNOSIS — Z933 Colostomy status: Secondary | ICD-10-CM | POA: Diagnosis not present

## 2021-07-05 DIAGNOSIS — Z79890 Hormone replacement therapy (postmenopausal): Secondary | ICD-10-CM | POA: Insufficient documentation

## 2021-07-05 DIAGNOSIS — Z7984 Long term (current) use of oral hypoglycemic drugs: Secondary | ICD-10-CM | POA: Insufficient documentation

## 2021-07-05 DIAGNOSIS — Z87891 Personal history of nicotine dependence: Secondary | ICD-10-CM | POA: Insufficient documentation

## 2021-07-05 DIAGNOSIS — C772 Secondary and unspecified malignant neoplasm of intra-abdominal lymph nodes: Secondary | ICD-10-CM | POA: Diagnosis not present

## 2021-07-05 DIAGNOSIS — Z79899 Other long term (current) drug therapy: Secondary | ICD-10-CM | POA: Insufficient documentation

## 2021-07-05 DIAGNOSIS — C187 Malignant neoplasm of sigmoid colon: Secondary | ICD-10-CM

## 2021-07-05 DIAGNOSIS — Z7982 Long term (current) use of aspirin: Secondary | ICD-10-CM | POA: Insufficient documentation

## 2021-07-05 DIAGNOSIS — C786 Secondary malignant neoplasm of retroperitoneum and peritoneum: Secondary | ICD-10-CM | POA: Diagnosis not present

## 2021-07-05 DIAGNOSIS — Z7983 Long term (current) use of bisphosphonates: Secondary | ICD-10-CM | POA: Insufficient documentation

## 2021-07-05 DIAGNOSIS — Z85038 Personal history of other malignant neoplasm of large intestine: Secondary | ICD-10-CM | POA: Diagnosis not present

## 2021-07-05 DIAGNOSIS — R1900 Intra-abdominal and pelvic swelling, mass and lump, unspecified site: Secondary | ICD-10-CM | POA: Diagnosis present

## 2021-07-05 LAB — CBC
HCT: 37.6 % (ref 36.0–46.0)
Hemoglobin: 11.9 g/dL — ABNORMAL LOW (ref 12.0–15.0)
MCH: 29.4 pg (ref 26.0–34.0)
MCHC: 31.6 g/dL (ref 30.0–36.0)
MCV: 92.8 fL (ref 80.0–100.0)
Platelets: 249 10*3/uL (ref 150–400)
RBC: 4.05 MIL/uL (ref 3.87–5.11)
RDW: 13.2 % (ref 11.5–15.5)
WBC: 8.5 10*3/uL (ref 4.0–10.5)
nRBC: 0 % (ref 0.0–0.2)

## 2021-07-05 LAB — PROTIME-INR
INR: 1.1 (ref 0.8–1.2)
Prothrombin Time: 14.5 seconds (ref 11.4–15.2)

## 2021-07-05 LAB — APTT: aPTT: 151 seconds — ABNORMAL HIGH (ref 24–36)

## 2021-07-05 LAB — GLUCOSE, CAPILLARY: Glucose-Capillary: 156 mg/dL — ABNORMAL HIGH (ref 70–99)

## 2021-07-05 MED ORDER — FENTANYL CITRATE (PF) 100 MCG/2ML IJ SOLN
INTRAMUSCULAR | Status: DC | PRN
  Administered 2021-07-05: 25 ug via INTRAVENOUS
  Administered 2021-07-05: 50 ug via INTRAVENOUS

## 2021-07-05 MED ORDER — SODIUM CHLORIDE 0.9% FLUSH: 10.0000 mL | INTRAVENOUS | Status: DC | PRN

## 2021-07-05 MED ORDER — CHLORHEXIDINE GLUCONATE CLOTH 2 % EX PADS: 6.0000 | MEDICATED_PAD | Freq: Every day | CUTANEOUS | Status: DC

## 2021-07-05 MED ORDER — MIDAZOLAM HCL 2 MG/2ML IJ SOLN
INTRAMUSCULAR | Status: AC
  Filled 2021-07-05: qty 2

## 2021-07-05 MED ORDER — SODIUM CHLORIDE 0.9% FLUSH: 10.0000 mL | Freq: Two times a day (BID) | INTRAVENOUS | Status: DC

## 2021-07-05 MED ORDER — FENTANYL CITRATE (PF) 100 MCG/2ML IJ SOLN
INTRAMUSCULAR | Status: AC
  Filled 2021-07-05: qty 2

## 2021-07-05 MED ORDER — HEPARIN SOD (PORK) LOCK FLUSH 100 UNIT/ML IV SOLN
500.0000 [IU] | INTRAVENOUS | Status: AC | PRN
  Administered 2021-07-05: 500 [IU]
  Filled 2021-07-05: qty 5

## 2021-07-05 MED ORDER — MIDAZOLAM HCL 2 MG/2ML IJ SOLN
INTRAMUSCULAR | Status: DC | PRN
  Administered 2021-07-05: 1 mg via INTRAVENOUS
  Administered 2021-07-05: .5 mg via INTRAVENOUS

## 2021-07-05 MED ORDER — ONDANSETRON HCL 4 MG/2ML IJ SOLN
4.0000 mg | Freq: Once | INTRAMUSCULAR | Status: DC
  Filled 2021-07-05: qty 2

## 2021-07-05 MED ORDER — LIDOCAINE-EPINEPHRINE 1 %-1:100000 IJ SOLN
INTRAMUSCULAR | Status: AC
  Filled 2021-07-05: qty 1

## 2021-07-05 MED ORDER — SODIUM CHLORIDE 0.9 % IV SOLN
INTRAVENOUS | Status: DC
Start: 2021-07-05 — End: 2021-07-06

## 2021-07-05 NOTE — H&P (Signed)
Chief Complaint: Hypermetabolic intra abdominal masses. Request is for abdominal mass biopsy. Per Dr. Lucrezia Europe  recommend RLQ intraperitoneal nodule. Series 4 Image 139  Referring Physician(s): Ennever,Peter R  Supervising Physician: Sandi Mariscal  Patient Status: [facility] - Out-pt  History of Present Illness: Bailey Walker is a 75 y.o. female 75 y.o.  Aramaic speaking female outpatient.  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 PET scan drop 10.3.22 reads Hypermetabolic poorly marginated 7.5 cm left adnexal mass compatible with malignancy, probably a metastasis. Two hypermetabolic right peritoneal soft tissue metastases in the right lower omentum and anterior right pelvis Team is requesting biopsy for molecular markers.  Case reviewed by IR Attending Dr. Lucrezia Europe who recommends biopsy of RLQ intraperitoneal nodule. Series 4 Image 139.    Currently without any significant complaints. Patient reports some dizziness x 2 days. Niece at bedside state that the patient's blood pressure has been elevated. Patient alert and laying in bed, calm and comfortable. Denies any fevers, headache, chest pain, SOB, cough, abdominal pain, nausea, vomiting or bleeding. Return precautions and treatment recommendations and follow-up discussed with the patient who is agreeable with the plan.    Past Medical History:  Diagnosis Date   Arthritis    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 ([place]) 04/26/2020   Cataract    Diabetes mellitus without complication (HCC)    GERD (gastroesophageal reflux disease)    Goals of care, counseling/discussion 04/26/2020   Hyperlipidemia    Hypertension    SVT (supraventricular tachycardia) ([place])    Thyroid disease     Past Surgical History:  Procedure Laterality Date   APPENDECTOMY     BIOPSY  04/18/2020   Procedure: BIOPSY;  Surgeon: Lavena Bullion, DO;  Location: [place]  ENDOSCOPY;  Service: Gastroenterology;;   BIOPSY  04/19/2020   Procedure: BIOPSY;  Surgeon: Lavena Bullion, DO;  Location: [place] ENDOSCOPY;  Service: Gastroenterology;;   BREAST SURGERY     Fatty tissue on biopsy   ESOPHAGOGASTRODUODENOSCOPY (EGD) WITH PROPOFOL N/A 04/18/2020   Procedure: ESOPHAGOGASTRODUODENOSCOPY (EGD) WITH PROPOFOL;  Surgeon: Lavena Bullion, DO;  Location: [place];  Service: Gastroenterology;  Laterality: N/A;   EYE SURGERY Bilateral    april and march 2019 for cataracts.    fatty gland     left wrist , right breast   FLEXIBLE SIGMOIDOSCOPY N/A 04/18/2020   Procedure: FLEXIBLE SIGMOIDOSCOPY;  Surgeon: Lavena Bullion, DO;  Location: [place];  Service: Gastroenterology;  Laterality: N/A;   FLEXIBLE SIGMOIDOSCOPY N/A 04/19/2020   Procedure: FLEXIBLE SIGMOIDOSCOPY;  Surgeon: Lavena Bullion, DO;  Location: [place];  Service: Gastroenterology;  Laterality: N/A;   INCISION AND DRAINAGE OF WOUND N/A 05/01/2020   Procedure: ABDOMINAL  WOUND EXPLORATION; IRRIGATION AND DEBRIDEMENT WOUND;  Surgeon: Rolm Bookbinder, MD;  Location: [place];  Service: General;  Laterality: N/A;   IR IMAGING GUIDED PORT INSERTION  06/13/2020   KNEE SURGERY     left knee   LAPAROTOMY N/A 04/20/2020   Procedure: SIGMOID COLECTOMY AND COLOSTOMY;  Surgeon: Coralie Keens, MD;  Location: [place];  Service: General;  Laterality: N/A;   SUBMUCOSAL TATTOO INJECTION  04/19/2020   Procedure: SUBMUCOSAL TATTOO INJECTION;  Surgeon: Lavena Bullion, DO;  Location: [facility];  Service: Gastroenterology;;    Allergies: Nitroglycerin and Ampicillin  Medications: Prior to Admission medications   Medication Sig Start Date End Date Taking? Authorizing Provider  alendronate (FOSAMAX) 70 MG tablet Take  70 mg by mouth every Sunday. 02/22/21  Yes [provider]  aspirin EC 81 MG tablet Take 81 mg by mouth daily. Swallow whole.   Yes [provider]  atorvastatin (LIPITOR)  10 MG tablet Take 2 tablets (20 mg total) by mouth daily. 12/28/20 07/05/21 Yes Hall, Carole N, DO  glipiZIDE (GLUCOTROL) 5 MG tablet TAKE 1 TABLET BY MOUTH TWICE A DAY BEFORE MEALS 11/04/19  Yes Dorena Dew, FNP  HYDROcodone-acetaminophen (NORCO/VICODIN) 5-325 MG tablet Take 1 tablet by mouth every 4 (four) hours as needed. 05/31/21  Yes Malvin Johns, MD  levothyroxine (SYNTHROID) 75 MCG tablet TAKE 1 TABLET BY MOUTH EVERY DAY Patient taking differently: Take 75 mcg by mouth daily before breakfast. 01/02/20  Yes Dorena Dew, FNP  metFORMIN (GLUCOPHAGE) 500 MG tablet Take 0.5 tablets (250 mg total) by mouth 2 (two) times daily with a meal. Patient taking differently: Take 500 mg by mouth 2 (two) times daily with a meal. 05/16/19  Yes [name], FNP  metoprolol tartrate (LOPRESSOR) 25 MG tablet Take 1 tablet (25 mg total) by mouth 2 (two) times daily. 08/02/19  Yes Tresa Garter, MD  pantoprazole (PROTONIX) 20 MG tablet Take 1 tablet (20 mg total) by mouth daily. 01/03/21  Yes Cleaver, Jossie Ng, NP  acetaminophen (TYLENOL) 500 MG tablet Take 500 mg by mouth in the morning and at bedtime. Patient not taking: Reported on 06/11/2021    [provider]  cephALEXin (KEFLEX) 500 MG capsule Take 1 capsule (500 mg total) by mouth 2 (two) times daily. 05/31/21   Malvin Johns, MD  [employer] GENTLE LAXATIVE 5 MG EC tablet Take 5 mg by mouth daily as needed. Patient not taking: Reported on 06/11/2021 05/28/21   [provider]  [employer] PURELAX 17 GM/SCOOP powder Take 17 g by mouth daily as needed. Patient not taking: Reported on 06/11/2021 05/28/21   [provider]  feeding supplement, ENSURE ENLIVE, (ENSURE ENLIVE) LIQD Take 237 mLs by mouth 3 (three) times daily between meals. Patient taking differently: Take 237 mLs by mouth in the morning and at bedtime. 05/02/20   Raiford Noble Latif, DO  HYDROcodone-acetaminophen (HYCET) 7.5-325 mg/15 ml solution Take 5 mLs by mouth every 6  (six) hours as needed for moderate pain. 06/11/21   Volanda Napoleon, MD  lidocaine-prilocaine (EMLA) cream Apply to affected area once 06/11/20   Ennever, Rudell Cobb, MD  methocarbamol (ROBAXIN) 500 MG tablet TAKE 1 TABLET BY MOUTH EVERY 12 HOURS AS NEEDED FOR MUSCLE SPASMS. 02/14/21   Volanda Napoleon, MD  Multiple Vitamin (MULTIVITAMIN WITH MINERALS) TABS tablet Take 1 tablet by mouth daily. 05/02/20   Sheikh, Omair Latif, DO  OVER THE COUNTER MEDICATION Take 5 mLs by mouth at bedtime. Mucosolve - used for cough (pt got from [place])    [provider]  prednisoLONE acetate (PRED FORTE) 1 % ophthalmic suspension Place 1 drop into both eyes every Monday.    [provider]     Family History  Problem Relation Age of Onset   Breast cancer Cousin        mat and pat sides   Cancer Sister        cancer everywhere   Heart disease Brother    Colon cancer Neg Hx    Esophageal cancer Neg Hx    Rectal cancer Neg Hx     Social History   Socioeconomic History   Marital status: Single    Spouse name: Not on  file   Number of children: 0   Years of education: Not on file   Highest education level: Not on file  Occupational History   Occupation: retired  Tobacco Use   Smoking status: Former    Types: Cigarettes    Quit date: 12/29/2013    Years since quitting: 7.5   Smokeless tobacco: Never  Vaping Use   Vaping Use: Never used  Substance and Sexual Activity   Alcohol use: No   Drug use: No   Sexual activity: Not Currently  Other Topics Concern   Not on file  Social History Narrative      Non-English speaking patient- speaks Education officer, community, [place], [place].  Originally from [employer] broadcast [employer] Strain: Not on file  Food Insecurity: Not on file  Transportation Needs: Not on file  Physical Activity: Not on file  Stress: Not on file  Social Connections: Not on file     Review of Systems: A 12 point ROS discussed and pertinent  positives are indicated in the HPI above.  All other systems are negative.  Review of Systems  Constitutional:  Negative for fatigue and fever.  HENT:  Negative for congestion.   Respiratory:  Negative for cough and shortness of breath.   Gastrointestinal:  Negative for abdominal pain, diarrhea, nausea and vomiting.   Vital Signs: BP (!) 158/78 (BP Location: Right Arm)   Pulse 79   Temp 98 F (36.7 C) (Oral)   Ht 5\' 2"  (1.575 m)   Wt 160 lb (72.6 kg)   SpO2 99%   BMI 29.26 kg/m   Physical Exam Vitals and nursing note reviewed.  Constitutional:      Appearance: She is well-developed.  HENT:     Head: Normocephalic and atraumatic.  Eyes:     Conjunctiva/sclera: Conjunctivae normal.  Cardiovascular:     Rate and Rhythm: Normal rate and regular rhythm.     Heart sounds: Normal heart sounds.  Pulmonary:     Effort: Pulmonary effort is normal.     Breath sounds: Normal breath sounds.  Musculoskeletal:        General: Normal range of motion.     Cervical back: Normal range of motion.  Skin:    General: Skin is warm.  Neurological:     Mental Status: She is alert and oriented to person, place, and time.     Comments: Patient reports dizziness that she believes is related to her elevated blood pressure.    Imaging: MR Pelvis W Wo Contrast  Result Date: 06/17/2021 CLINICAL DATA:  Pelvic pain. Left adnexal mass. Personal history of colon carcinoma. EXAM: MRI PELVIS WITHOUT AND WITH CONTRAST TECHNIQUE: Multiplanar multisequence MR imaging of the pelvis was performed both before and after administration of intravenous contrast. CONTRAST:  80mL GADAVIST GADOBUTROL 1 MMOL/ML IV SOLN COMPARISON:  CT on 05/31/2021 FINDINGS: Lower Urinary Tract: No urinary bladder or urethral abnormality identified. Bowel: Previous sigmoid colon resection again seen, with left lower quadrant colostomy. A moderate parastomal hernia is seen containing multiple small bowel loops. No evidence of bowel  obstruction. Vascular/Lymphatic: Unremarkable. No pathologically enlarged pelvic lymph nodes identified. Reproductive: -- Uterus: Measures 8.1 by 3.5 by 3.9 cm (volume = 58 cm^3). Retroverted and tilted to right. No fibroids are identified. Endometrial thickness measures 10 mm, which is abnormal in a postmenopausal female. A mass is seen in the vagina which abuts the external cervical os, and contains several tiny internal cystic foci. This measures  3.4 x 1.2 cm, and is suspicious for a prolapsing endocervical polyp, with vaginal soft tissue mass considered less likely. -- Right ovary: Not visualized, however no adnexal mass identified. -- Left ovary: A complex bilobed mass is seen in the left adnexa, which shows heterogeneous T2 hypointensity and irregular peripheral contrast enhancement. There is a small tubular cystic portion seen along the superior and medial margin of this mass. This measures 6.0 x 3.6 cm on image 35/17. Other: No peritoneal thickening or abnormal free fluid. Musculoskeletal:  Unremarkable. : 6 cm complex bilobed left adnexal mass. Differential diagnosis includes primary ovarian neoplasm, metastatic disease, and atypical hydrosalpinx. Thickened endometrium measuring 10 mm. Endometrial carcinoma cannot be excluded in a postmenopausal female. Tissue sampling is recommended. 3.4 x 1.2 cm mass within vagina and external cervical os. This is suspicious for a prolapsing endocervical polyp, with primary vaginal neoplasm considered less likely. Recommend direct visualization. Electronically Signed   By: Marlaine Hind M.D.   On: 06/17/2021 13:03   NM PET Image Initial (PI) Skull Base To Thigh  Result Date: 06/24/2021 CLINICAL DATA:  Initial treatment strategy for stage IIIB sigmoid colon cancer status post partial colectomy 04/20/2020, completed chemotherapy 10/24/2020, with new left adnexal mass on CT. EXAM: NUCLEAR MEDICINE PET SKULL BASE TO THIGH TECHNIQUE: 8.8 mCi F-18 FDG was injected  intravenously. Full-ring PET imaging was performed from the skull base to thigh after the radiotracer. CT data was obtained and used for attenuation correction and anatomic localization. Fasting blood glucose: 158 mg/dl COMPARISON:  05/31/2021 CT abdomen/pelvis. 11/13/2020 CT chest, abdomen and pelvis. FINDINGS: Mediastinal blood pool activity: SUV max 3.9 Liver activity: SUV max NA NECK: No hypermetabolic lymph nodes in the neck. Incidental CT findings: Right internal jugular Port-A-Cath terminates at the cavoatrial junction. CHEST: No enlarged or hypermetabolic axillary, mediastinal or hilar lymph nodes. Hypermetabolic new solid 1.5 cm superior segment left lower lobe pulmonary nodule with max SUV 4.1 (series 8/image 26). No additional hypermetabolic pulmonary findings. Incidental CT findings: Previously visualized scattered right pulmonary nodules measuring up to 1.2 cm in the anterior apical right upper lobe demonstrate no significant FDG uptake and are unchanged, more likely benign. Coronary atherosclerosis. Atherosclerotic nonaneurysmal thoracic aorta. ABDOMEN/PELVIS: Poorly marginated hypermetabolic solid 2.7 cm right lower quadrant omental mass with max SUV 8.1 (series 4/image 139), increased from 2.1 cm on 05/31/2021 CT. Hypermetabolic 1.0 cm anterior right pelvic soft tissue nodule with max SUV 5.4 (series 4/image 151). Poorly marginated solid hypermetabolic 7.5 x 4.1 cm left adnexal mass with max SUV 8.7 (series 4/image 144), abutting the uterus, pelvic small bowel loops and the left upper margin of the Hartmann's pouch. No abnormal hypermetabolic activity within the liver, pancreas, adrenal glands, or spleen. No hypermetabolic lymph nodes in the abdomen or pelvis. Incidental CT findings: Small hiatal hernia. Atherosclerotic nonaneurysmal abdominal aorta. Mild diffuse hepatic steatosis. Postsurgical changes from sigmoid colectomy with end colostomy in the ventral left abdominal wall. Large parastomal  hernia containing multiple small bowel loops with no dilated or thick-walled small bowel loops. SKELETON: No focal hypermetabolic activity to suggest skeletal metastasis. Incidental CT findings: none IMPRESSION: 1. Hypermetabolic poorly marginated 7.5 cm left adnexal mass compatible with malignancy, probably a metastasis. 2. Two hypermetabolic right peritoneal soft tissue metastases in the right lower omentum and anterior right pelvis. 3. New hypermetabolic 1.5 cm solid pulmonary metastasis in the superior segment left lower lobe. 4. Chronic findings include: Aortic Atherosclerosis (ICD10-I70.0). Small hiatal hernia. Mild diffuse hepatic steatosis. 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 Electronically Signed   By: Ilona Sorrel M.D.   On: 06/24/2021 12:38    Labs:  CBC: Recent Labs    05/31/21 1335 06/03/21 0906 06/07/21 1212 06/11/21 0952  WBC 10.5 9.0 9.3 8.0  HGB 13.6 12.6 12.7 12.1  HCT 41.5 39.9 38.7 37.1  PLT 286 278 294 252    COAGS: No results for input(s): INR, APTT in the last 8760 hours.  BMP: Recent Labs    05/31/21 1335 06/03/21 0906 06/07/21 1212 06/11/21 0952  NA 137 136 138 137  K 4.2 4.4 4.5 3.6  CL 102 102 103 99  CO2 25 24 23 24   GLUCOSE 137* 238* 143* 242*  BUN 15 17 12 16   CALCIUM 9.4 9.3 10.0 9.2  CREATININE 0.54 0.70 0.74 0.71  GFRNONAA >60 >60 >60 >60    LIVER FUNCTION TESTS: Recent Labs    02/13/21 1214 05/31/21 1335 06/07/21 1212 06/11/21 0952  BILITOT 0.4 0.4 0.3 0.4  AST 25 26 23 17   ALT 25 22 23 18   ALKPHOS 40 50 52 43  PROT 7.2 8.3* 7.8 7.1  ALBUMIN 4.2 4.4 4.1 4.1     Assessment and Plan:  75 y.o.  Aramaic speaking female outpatient.  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 PET scan drop 10.3.22 reads Hypermetabolic poorly marginated 7.5 cm left adnexal mass compatible with malignancy, probably a metastasis. Two  hypermetabolic right peritoneal soft tissue metastases in the right lower omentum and anterior right pelvis. Team is requesting biopsy for molecular markers.  Case reviewed by IR Attending Dr. Lucrezia Europe who recommends biopsy of RLQ intraperitoneal nodule. Series 4 Image 139.    All labs and medications are within acceptable parameters. Allergies include ampicillin. Patient has been NPO since midnight.  Risks and benefits of intra abdominal abscess was discussed with the patient and/or patient's family including, but not limited to bleeding, infection, damage to adjacent structures or low yield requiring additional tests.  All of the questions were answered and there is agreement to proceed.  Consent signed and in chart.   Thank you for this interesting consult.  I greatly enjoyed meeting Dayle Aslyn Cottman and look forward to participating in their care.  A copy of this report was sent to the requesting provider on this date.  Electronically Signed: Jacqualine Mau, NP 07/05/2021, 10:16 AM   I spent a total of  30 Minutes   in face to face in clinical consultation, greater than 50% of which was counseling/coordinating care for RLQ abdominal mass biopsy

## 2021-07-05 NOTE — Procedures (Signed)
Interventional Radiology Procedure Note  Procedure: US guided biopsy of FDG avid mesenteric nodule, RLQ.    Complications: None  EBL: None  Recommendations: - Bedrest 1 hours.   - Routine wound care - Follow up pathology - Advance diet   Signed,  Corrie Mckusick, DO

## 2021-07-08 LAB — SURGICAL PATHOLOGY

## 2021-07-10 ENCOUNTER — Other Ambulatory Visit: Payer: Self-pay | Admitting: Hematology & Oncology

## 2021-07-10 DIAGNOSIS — C187 Malignant neoplasm of sigmoid colon: Secondary | ICD-10-CM

## 2021-07-11 ENCOUNTER — Encounter: Payer: Self-pay | Admitting: Hematology & Oncology

## 2021-07-11 ENCOUNTER — Encounter: Payer: Self-pay | Admitting: Hematology and Oncology

## 2021-07-15 ENCOUNTER — Telehealth: Payer: Self-pay

## 2021-07-15 NOTE — Telephone Encounter (Signed)
Bailey Walker, patients niece called asking for biopsy results. States she was supposed to get a call last week but hasnt heard anything. Discussed with Gillian Shields as MD is on PAL. Called and informed Bailey Walker the biopsy did confirm metastatic disease as Dr.Ennever has discussed with her. Informed her we would get them in to see Dr.Ennever next week when he returns to discuss. She agreed and was appreciative of call.

## 2021-07-29 ENCOUNTER — Inpatient Hospital Stay: Payer: Medicaid Other

## 2021-07-29 ENCOUNTER — Encounter: Payer: Self-pay | Admitting: Hematology & Oncology

## 2021-07-29 ENCOUNTER — Other Ambulatory Visit: Payer: Self-pay

## 2021-07-29 ENCOUNTER — Inpatient Hospital Stay: Payer: Medicaid Other | Admitting: Hematology & Oncology

## 2021-07-29 ENCOUNTER — Other Ambulatory Visit: Payer: Self-pay | Admitting: *Deleted

## 2021-07-29 ENCOUNTER — Encounter: Payer: Self-pay | Admitting: *Deleted

## 2021-07-29 ENCOUNTER — Inpatient Hospital Stay: Payer: Medicaid Other | Attending: Gynecologic Oncology

## 2021-07-29 VITALS — BP 133/52 | HR 77 | Temp 98.3°F | Resp 18 | Wt 177.0 lb

## 2021-07-29 DIAGNOSIS — Z5111 Encounter for antineoplastic chemotherapy: Secondary | ICD-10-CM | POA: Diagnosis present

## 2021-07-29 DIAGNOSIS — C187 Malignant neoplasm of sigmoid colon: Secondary | ICD-10-CM | POA: Diagnosis present

## 2021-07-29 DIAGNOSIS — C7800 Secondary malignant neoplasm of unspecified lung: Secondary | ICD-10-CM | POA: Insufficient documentation

## 2021-07-29 DIAGNOSIS — C772 Secondary and unspecified malignant neoplasm of intra-abdominal lymph nodes: Secondary | ICD-10-CM

## 2021-07-29 DIAGNOSIS — C786 Secondary malignant neoplasm of retroperitoneum and peritoneum: Secondary | ICD-10-CM | POA: Insufficient documentation

## 2021-07-29 DIAGNOSIS — Z95828 Presence of other vascular implants and grafts: Secondary | ICD-10-CM

## 2021-07-29 DIAGNOSIS — D510 Vitamin B12 deficiency anemia due to intrinsic factor deficiency: Secondary | ICD-10-CM | POA: Insufficient documentation

## 2021-07-29 DIAGNOSIS — D509 Iron deficiency anemia, unspecified: Secondary | ICD-10-CM

## 2021-07-29 LAB — COMPREHENSIVE METABOLIC PANEL
ALT: 13 U/L (ref 0–44)
AST: 14 U/L — ABNORMAL LOW (ref 15–41)
Albumin: 3.9 g/dL (ref 3.5–5.0)
Alkaline Phosphatase: 37 U/L — ABNORMAL LOW (ref 38–126)
Anion gap: 9 (ref 5–15)
BUN: 13 mg/dL (ref 8–23)
CO2: 25 mmol/L (ref 22–32)
Calcium: 9.3 mg/dL (ref 8.9–10.3)
Chloride: 103 mmol/L (ref 98–111)
Creatinine, Ser: 0.59 mg/dL (ref 0.44–1.00)
GFR, Estimated: >60 mL/min (ref >60)
Glucose, Bld: 217 mg/dL — ABNORMAL HIGH (ref 70–99)
Potassium: 3.6 mmol/L (ref 3.5–5.1)
Sodium: 137 mmol/L (ref 135–145)
Total Bilirubin: 0.3 mg/dL (ref 0.3–1.2)
Total Protein: 6.7 g/dL (ref 6.5–8.1)

## 2021-07-29 LAB — CBC WITH DIFFERENTIAL (CANCER CENTER ONLY)
Abs Immature Granulocytes: 0.03 10*3/uL (ref 0.00–0.07)
Basophils Absolute: 0.1 10*3/uL (ref 0.0–0.1)
Basophils Relative: 1 %
Eosinophils Absolute: 0.2 10*3/uL (ref 0.0–0.5)
Eosinophils Relative: 3 %
HCT: 35.7 % — ABNORMAL LOW (ref 36.0–46.0)
Hemoglobin: 11.3 g/dL — ABNORMAL LOW (ref 12.0–15.0)
Immature Granulocytes: 0 %
Lymphocytes Relative: 23 %
Lymphs Abs: 1.9 10*3/uL (ref 0.7–4.0)
MCH: 29.3 pg (ref 26.0–34.0)
MCHC: 31.7 g/dL (ref 30.0–36.0)
MCV: 92.5 fL (ref 80.0–100.0)
Monocytes Absolute: 0.7 10*3/uL (ref 0.1–1.0)
Monocytes Relative: 8 %
Neutro Abs: 5.5 10*3/uL (ref 1.7–7.7)
Neutrophils Relative %: 65 %
Platelet Count: 259 10*3/uL (ref 150–400)
RBC: 3.86 MIL/uL — ABNORMAL LOW (ref 3.87–5.11)
RDW: 12.9 % (ref 11.5–15.5)
WBC Count: 8.4 10*3/uL (ref 4.0–10.5)
nRBC: 0 % (ref 0.0–0.2)

## 2021-07-29 LAB — LACTATE DEHYDROGENASE: LDH: 137 U/L (ref 98–192)

## 2021-07-29 MED ORDER — MORPHINE SULFATE ER 15 MG PO TBCR: 15.0000 mg | EXTENDED_RELEASE_TABLET | Freq: Two times a day (BID) | ORAL | 0 refills | Status: DC

## 2021-07-29 MED ORDER — HEPARIN SOD (PORK) LOCK FLUSH 100 UNIT/ML IV SOLN
500.0000 [IU] | Freq: Once | INTRAVENOUS | Status: AC
  Administered 2021-07-29: 500 [IU] via INTRAVENOUS

## 2021-07-29 MED ORDER — SODIUM CHLORIDE 0.9% FLUSH
10.0000 mL | Freq: Once | INTRAVENOUS | Status: AC
  Administered 2021-07-29: 10 mL via INTRAVENOUS

## 2021-07-29 MED ORDER — MORPHINE SULFATE 15 MG PO TABS: 15.0000 mg | ORAL_TABLET | ORAL | 0 refills | Status: DC | PRN

## 2021-07-29 NOTE — Patient Instructions (Signed)

## 2021-07-29 NOTE — Progress Notes (Unsigned)
[id number] PA authorized #[id number] 07/29/21-01/25/22

## 2021-07-29 NOTE — Progress Notes (Signed)
Hematology and Oncology Follow Up Visit  Bailey Walker [id number] 1946/02/11 75 y.o. 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  Unfortunately, we deftly have recurrent disease.  She had a PET scan done on 06/24/2021.  This did show a hypermetabolic left adnexal mass that measures 7.5 cm.  She had 2 hypermetabolic right peritoneal soft tissue metastasis.  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  We did get a biopsy.  This was of the abdominal mass.  This was done on 07/05/2021.  The pathology report ([id number]) showed metastatic adenocarcinoma with mucinous features.  We do not have the molecular analysis back yet.  She is having a lot of pain.  We are going to have to see about getting on some long-acting pain medication.  We will trial some MS Contin and MS IR.  Maybe, the 2 of these will be able to help with the discomfort.  I think that FOLFIRI would be a reasonable way to treat her.  She initially had FOLFOX.  I cannot use Avastin.  She still has a abdominal wound that has not fully healed up.  I would worry that utilizing Avastin we would cause this wound to open back up.  Probably need to see if she is K-ras mutated.  If not, then we might consider Vectibix.  She is having no problems with cough or shortness of breath.  She has a colostomy.,  Sure the colostomy will ever be reversed.  She has had no fever.  She is eating okay.  She is having no nausea or vomiting.  Overall, I would say performance status is probably ECOG 1.   Medications:  Allergies as of 07/29/2021       Reactions   Nitroglycerin Nausea And  Vomiting   Ampicillin Other (See Comments)   Per allergy test        Medication List        Accurate as of July 29, 2021 12:30 PM. If you have any questions, ask your nurse or doctor.          STOP taking these medications    lidocaine-prilocaine cream Commonly known as: EMLA Stopped by: Volanda Napoleon, MD       TAKE these medications    acetaminophen 500 MG tablet Commonly known as: TYLENOL Take 500 mg by mouth in the morning and at bedtime.   alendronate 70 MG tablet Commonly known as: FOSAMAX Take 70 mg by mouth every Sunday.   aspirin EC 81 MG tablet Take 81 mg by mouth daily. Swallow whole.   atorvastatin 10 MG tablet Commonly known as: LIPITOR Take 2 tablets (20 mg total) by mouth daily.   cephALEXin 500 MG capsule Commonly known as: KEFLEX Take 1 capsule (500 mg total) by mouth 2 (two) times daily.   [employer] Gentle Laxative 5 MG EC tablet Generic drug: bisacodyl Take 5 mg by mouth daily as needed.   [employer] Purelax 17 GM/SCOOP powder Generic drug: polyethylene glycol powder Take 17 g by mouth daily as needed.   feeding supplement Liqd Take 237 mLs by mouth 3 (three) times  daily between meals. What changed: when to take this   glipiZIDE 5 MG tablet Commonly known as: GLUCOTROL TAKE 1 TABLET BY MOUTH TWICE A DAY BEFORE MEALS   HYDROcodone-acetaminophen 5-325 MG tablet Commonly known as: NORCO/VICODIN Take 1 tablet by mouth every 4 (four) hours as needed.   HYDROcodone-acetaminophen 7.5-325 mg/15 ml solution Commonly known as: HYCET Take 5 mLs by mouth every 6 (six) hours as needed for moderate pain.   levothyroxine 75 MCG tablet Commonly known as: SYNTHROID TAKE 1 TABLET BY MOUTH EVERY DAY What changed: when to take this   metFORMIN 500 MG tablet Commonly known as: GLUCOPHAGE Take 0.5 tablets (250 mg total) by mouth 2 (two) times daily with a meal. What changed: how much to take   methocarbamol 500 MG tablet Commonly known as:  ROBAXIN TAKE 1 TABLET BY MOUTH EVERY 12 HOURS AS NEEDED FOR MUSCLE SPASMS.   metoprolol tartrate 25 MG tablet Commonly known as: LOPRESSOR Take 1 tablet (25 mg total) by mouth 2 (two) times daily.   multivitamin with minerals Tabs tablet Take 1 tablet by mouth daily.   OVER THE COUNTER MEDICATION Take 5 mLs by mouth at bedtime. Mucosolve - used for cough (pt got from [place])   pantoprazole 20 MG tablet Commonly known as: Protonix Take 1 tablet (20 mg total) by mouth daily.   prednisoLONE acetate 1 % ophthalmic suspension Commonly known as: PRED FORTE Place 1 drop into both eyes every Monday.        Allergies:  Allergies  Allergen Reactions   Nitroglycerin Nausea And Vomiting   Ampicillin Other (See Comments)    Per allergy test    Past Medical History, Surgical history, Social history, and Family History were reviewed and updated.  Review of Systems: Review of Systems  HENT: Negative.    Eyes: Negative.   Respiratory:  Positive for shortness of breath.   Cardiovascular: Negative.   Gastrointestinal:  Positive for abdominal pain.  Genitourinary: Negative.   Musculoskeletal:  Positive for back pain.  Skin: Negative.   Neurological: Negative.   Endo/Heme/Allergies: Negative.   Psychiatric/Behavioral: Negative.      Physical Exam:  weight is 177 lb (80.3 kg). Her oral temperature is 98.3 F (36.8 C). Her blood pressure is 133/52 (abnormal) and her pulse is 77. Her respiration is 18 and oxygen saturation is 100%.   Wt Readings from Last 3 Encounters:  07/29/21 177 lb (80.3 kg)  07/05/21 160 lb (72.6 kg)  06/11/21 177 lb (80.3 kg)    Physical Exam Vitals reviewed.  HENT:     Head: Normocephalic and atraumatic.  Eyes:     Pupils: Pupils are equal, round, and reactive to light.  Cardiovascular:     Rate and Rhythm: Normal rate and regular rhythm.     Heart sounds: Normal heart sounds.  Pulmonary:     Effort: Pulmonary effort is normal.     Breath  sounds: Normal breath sounds.  Abdominal:     General: Bowel sounds are normal.     Palpations: Abdomen is soft.     Comments: Her abdomen is slightly distended.  There is no obvious fluid wave.  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  She still has a dressing over the abdominal laparotomy scar.  She does not have any obvious erythema or warmth.  There is no exudate coming from the scars.  She has decent bowel sounds.  There is no palpable liver or spleen tip.  Musculoskeletal:  General: No tenderness or deformity. Normal range of motion.     Cervical back: Normal range of motion.  Lymphadenopathy:     Cervical: No cervical adenopathy.  Skin:    General: Skin is warm and dry.     Findings: No erythema or rash.  Neurological:     Mental Status: She is alert and oriented to person, place, and time.  Psychiatric:        Behavior: Behavior normal.        Thought Content: Thought content normal.        Judgment: Judgment normal.     Lab Results  Component Value Date   WBC 8.4 07/29/2021   HGB 11.3 (L) 07/29/2021   HCT 35.7 (L) 07/29/2021   MCV 92.5 07/29/2021   PLT 259 07/29/2021   Lab Results  Component Value Date   FERRITIN 96 07/04/2020   IRON 55 07/04/2020   TIBC 335 07/04/2020   UIBC 280 07/04/2020   IRONPCTSAT 17 (L) 07/04/2020   Lab Results  Component Value Date   RETICCTPCT 1.9 01/05/2020   RBC 3.86 (L) 07/29/2021   No results found for: KPAFRELGTCHN, LAMBDASER, KAPLAMBRATIO No results found for: IGGSERUM, IGA, IGMSERUM No results found for: Odetta Pink, SPEI   Chemistry      Component Value Date/Time   NA 137 07/29/2021 1040   NA 139 07/12/2019 1110   K 3.6 07/29/2021 1040   CL 103 07/29/2021 1040   CO2 25 07/29/2021 1040   BUN 13 07/29/2021 1040   BUN 15 07/12/2019 1110   CREATININE 0.59 07/29/2021 1040   CREATININE 0.71 06/11/2021 0952   CREATININE 0.74 01/08/2017 1100      Component  Value Date/Time   CALCIUM 9.3 07/29/2021 1040   ALKPHOS 37 (L) 07/29/2021 1040   AST 14 (L) 07/29/2021 1040   AST 17 06/11/2021 0952   ALT 13 07/29/2021 1040   ALT 18 06/11/2021 0952   BILITOT 0.3 07/29/2021 1040   BILITOT 0.4 06/11/2021 0952       Impression and Plan: Ms. 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   Again, I just hate that she has recurrence.  This is certainly ominous given that we completed her adjuvant chemotherapy back in January.  The fact that she is recurred so quickly is somewhat concerning.  Again, we will use FOLFIRI.  I cannot use Avastin because of this abdominal wound that is not fully healed.  We will have to see what the molecular analysis shows.  Hopefully I will have that back in a week or so.  I want try to get treatment started quickly.  She is having quite a bit of pain.  I will do 4 cycles of treatment and then we will repeat the scans.  Hopefully, we will find that she is responding.  I think her pain should improve and this should tell us if she is responding.  Again this is quite complicated.  I just feel bad for Ms. Meland.  She has tried incredibly hard.  She is done everything we have asked her to do.  She has great support from her family.    Volanda Napoleon, MD 11/7/202212:30 PM

## 2021-07-30 LAB — CEA (IN HOUSE-CHCC): CEA (CHCC-In House): 17.56 ng/mL — ABNORMAL HIGH (ref 0.00–5.00)

## 2021-07-30 LAB — IRON AND TIBC
Iron: 48 ug/dL (ref 41–142)
Saturation Ratios: 16 % — ABNORMAL LOW (ref 21–57)
TIBC: 290 ug/dL (ref 236–444)
UIBC: 243 ug/dL (ref 120–384)

## 2021-07-30 LAB — FERRITIN: Ferritin: 77 ng/mL (ref 11–307)

## 2021-07-31 ENCOUNTER — Other Ambulatory Visit: Payer: Medicaid Other

## 2021-07-31 ENCOUNTER — Inpatient Hospital Stay: Payer: Medicaid Other

## 2021-07-31 ENCOUNTER — Other Ambulatory Visit: Payer: Self-pay

## 2021-07-31 ENCOUNTER — Telehealth: Payer: Self-pay

## 2021-07-31 ENCOUNTER — Other Ambulatory Visit: Payer: Self-pay | Admitting: Family

## 2021-07-31 VITALS — BP 175/74 | HR 77 | Temp 98.0°F | Resp 16

## 2021-07-31 DIAGNOSIS — C772 Secondary and unspecified malignant neoplasm of intra-abdominal lymph nodes: Secondary | ICD-10-CM

## 2021-07-31 DIAGNOSIS — Z5111 Encounter for antineoplastic chemotherapy: Secondary | ICD-10-CM | POA: Diagnosis not present

## 2021-07-31 DIAGNOSIS — D509 Iron deficiency anemia, unspecified: Secondary | ICD-10-CM

## 2021-07-31 DIAGNOSIS — C187 Malignant neoplasm of sigmoid colon: Secondary | ICD-10-CM

## 2021-07-31 MED ORDER — ATROPINE SULFATE 1 MG/ML IV SOLN
0.5000 mg | Freq: Once | INTRAVENOUS | Status: AC | PRN
  Administered 2021-07-31: 0.5 mg via INTRAVENOUS
  Filled 2021-07-31: qty 1

## 2021-07-31 MED ORDER — FLUOROURACIL CHEMO INJECTION 2.5 GM/50ML
400.0000 mg/m2 | Freq: Once | INTRAVENOUS | Status: AC
  Administered 2021-07-31: 750 mg via INTRAVENOUS
  Filled 2021-07-31: qty 15

## 2021-07-31 MED ORDER — SODIUM CHLORIDE 0.9 % IV SOLN
10.0000 mg | Freq: Once | INTRAVENOUS | Status: AC
  Administered 2021-07-31: 10 mg via INTRAVENOUS
  Filled 2021-07-31: qty 10

## 2021-07-31 MED ORDER — SODIUM CHLORIDE 0.9 % IV SOLN
144.0000 mg/m2 | Freq: Once | INTRAVENOUS | Status: AC
  Administered 2021-07-31: 260 mg via INTRAVENOUS
  Filled 2021-07-31: qty 5

## 2021-07-31 MED ORDER — DEXAMETHASONE 4 MG PO TABS: 8.0000 mg | ORAL_TABLET | Freq: Every day | ORAL | 5 refills | Status: DC

## 2021-07-31 MED ORDER — PROCHLORPERAZINE MALEATE 10 MG PO TABS: 10.0000 mg | ORAL_TABLET | Freq: Four times a day (QID) | ORAL | 1 refills | Status: AC | PRN

## 2021-07-31 MED ORDER — PALONOSETRON HCL INJECTION 0.25 MG/5ML
0.2500 mg | Freq: Once | INTRAVENOUS | Status: AC
  Administered 2021-07-31: 0.25 mg via INTRAVENOUS
  Filled 2021-07-31: qty 5

## 2021-07-31 MED ORDER — ONDANSETRON HCL 8 MG PO TABS: 8.0000 mg | ORAL_TABLET | Freq: Two times a day (BID) | ORAL | 1 refills | Status: DC | PRN

## 2021-07-31 MED ORDER — SODIUM CHLORIDE 0.9 % IV SOLN
510.0000 mg | Freq: Once | INTRAVENOUS | Status: AC
  Administered 2021-07-31: 510 mg via INTRAVENOUS
  Filled 2021-07-31: qty 17

## 2021-07-31 MED ORDER — SODIUM CHLORIDE 0.9 % IV SOLN: Freq: Once | INTRAVENOUS | Status: AC

## 2021-07-31 MED ORDER — SODIUM CHLORIDE 0.9 % IV SOLN
1920.0000 mg/m2 | INTRAVENOUS | Status: DC
  Administered 2021-07-31: 3600 mg via INTRAVENOUS
  Filled 2021-07-31: qty 72

## 2021-07-31 MED ORDER — LOPERAMIDE HCL 2 MG PO CAPS: 4.0000 mg | ORAL_CAPSULE | ORAL | 2 refills | Status: DC | PRN

## 2021-07-31 MED ORDER — SODIUM CHLORIDE 0.9 % IV SOLN
400.0000 mg/m2 | Freq: Once | INTRAVENOUS | Status: AC
  Administered 2021-07-31: 748 mg via INTRAVENOUS
  Filled 2021-07-31: qty 37.4

## 2021-07-31 NOTE — Telephone Encounter (Signed)
-----   Message from Volanda Napoleon, MD sent at 07/30/2021  8:43 PM EST ----- Please make sure that she gets IV iron with her chemo on Wednesday!!  Laurey Arrow

## 2021-07-31 NOTE — Patient Instructions (Signed)
[name]  Discharge Instructions: Thank you for choosing [facility] to provide your oncology and hematology care.   If you have a lab appointment with the [facility], please go directly to the [facility] and check in at the registration area.  Wear comfortable clothing and clothing appropriate for easy access to any Portacath or PICC line.   We strive to give you quality time with your provider. You may need to reschedule your appointment if you arrive late (15 or more minutes).  Arriving late affects you and other patients whose appointments are after yours.  Also, if you miss three or more appointments without notifying the office, you may be dismissed from the clinic at the provider's discretion.      For prescription refill requests, have your pharmacy contact our office and allow 72 hours for refills to be completed.    Today you received the following chemotherapy and/or immunotherapy agents FOLFIRI      To help prevent nausea and vomiting after your treatment, we encourage you to take your nausea medication as directed.  BELOW ARE SYMPTOMS THAT SHOULD BE REPORTED IMMEDIATELY: *FEVER GREATER THAN 100.4 F (38 C) OR HIGHER *CHILLS OR SWEATING *NAUSEA AND VOMITING THAT IS NOT CONTROLLED WITH YOUR NAUSEA MEDICATION *UNUSUAL SHORTNESS OF BREATH *UNUSUAL BRUISING OR BLEEDING *URINARY PROBLEMS (pain or burning when urinating, or frequent urination) *BOWEL PROBLEMS (unusual diarrhea, constipation, pain near the anus) TENDERNESS IN MOUTH AND THROAT WITH OR WITHOUT PRESENCE OF ULCERS (sore throat, sores in mouth, or a toothache) UNUSUAL RASH, SWELLING OR PAIN  UNUSUAL VAGINAL DISCHARGE OR ITCHING   Items with * indicate a potential emergency and should be followed up as soon as possible or go to the Emergency Department if any problems should occur.  Please show the CHEMOTHERAPY ALERT CARD or IMMUNOTHERAPY ALERT CARD at check-in to the  Emergency Department and triage nurse. Should you have questions after your visit or need to cancel or reschedule your appointment, please contact [facility]  [phone] and follow the prompts.  Office hours are 8:00 a.m. to 4:30 p.m. Monday - Friday. Please note that voicemails left after 4:00 p.m. may not be returned until the following business day.  We are closed weekends and major holidays. You have access to a nurse at all times for urgent questions. Please call the main number to the clinic [phone] and follow the prompts.  For any non-urgent questions, you may also contact your provider using MyChart. We now offer e-Visits for anyone 51 and older to request care online for non-urgent symptoms. For details visit [email or web address].   Also download the MyChart app! Go to the app store, search "MyChart", open the app, select , and log in with your MyChart username and password.  Due to Covid, a mask is required upon entering the hospital/clinic. If you do not have a mask, one will be given to you upon arrival. For doctor visits, patients may have 1 support person aged 66 or older with them. For treatment visits, patients cannot have anyone with them due to current Covid guidelines and our immunocompromised population.

## 2021-07-31 NOTE — Telephone Encounter (Signed)
Advised pt via in office.

## 2021-08-02 ENCOUNTER — Other Ambulatory Visit: Payer: Self-pay

## 2021-08-02 ENCOUNTER — Inpatient Hospital Stay: Payer: Medicaid Other

## 2021-08-02 VITALS — BP 143/68 | HR 70 | Temp 98.0°F | Resp 16

## 2021-08-02 DIAGNOSIS — C772 Secondary and unspecified malignant neoplasm of intra-abdominal lymph nodes: Secondary | ICD-10-CM

## 2021-08-02 DIAGNOSIS — C187 Malignant neoplasm of sigmoid colon: Secondary | ICD-10-CM

## 2021-08-02 DIAGNOSIS — Z5111 Encounter for antineoplastic chemotherapy: Secondary | ICD-10-CM | POA: Diagnosis not present

## 2021-08-02 DIAGNOSIS — E039 Hypothyroidism, unspecified: Secondary | ICD-10-CM

## 2021-08-02 MED ORDER — SODIUM CHLORIDE 0.9% FLUSH
10.0000 mL | INTRAVENOUS | Status: DC | PRN
  Administered 2021-08-02: 10 mL

## 2021-08-02 MED ORDER — HEPARIN SOD (PORK) LOCK FLUSH 100 UNIT/ML IV SOLN
500.0000 [IU] | Freq: Once | INTRAVENOUS | Status: AC | PRN
  Administered 2021-08-02: 500 [IU]

## 2021-08-02 NOTE — Patient Instructions (Signed)
Fluorouracil, 5-FU injection What is this medication? FLUOROURACIL, 5-FU (flure oh YOOR a sil) is a chemotherapy drug. It slows the growth of cancer cells. This medicine is used to treat many types of cancer like breast cancer, colon or rectal cancer, pancreatic cancer, and stomach cancer. This medicine may be used for other purposes; ask your health care provider or pharmacist if you have questions. COMMON BRAND NAME(S): Adrucil What should I tell my care team before I take this medication? They need to know if you have any of these conditions: blood disorders dihydropyrimidine dehydrogenase (DPD) deficiency infection (especially a virus infection such as chickenpox, cold sores, or herpes) kidney disease liver disease malnourished, poor nutrition recent or ongoing radiation therapy an unusual or allergic reaction to fluorouracil, other chemotherapy, other medicines, foods, dyes, or preservatives pregnant or trying to get pregnant breast-feeding How should I use this medication? This drug is given as an infusion or injection into a vein. It is administered in a hospital or clinic by a specially trained health care professional. Talk to your pediatrician regarding the use of this medicine in children. Special care may be needed. Overdosage: If you think you have taken too much of this medicine contact a poison control center or emergency room at once. NOTE: This medicine is only for you. Do not share this medicine with others. What if I miss a dose? It is important not to miss your dose. Call your doctor or health care professional if you are unable to keep an appointment. What may interact with this medication? Do not take this medicine with any of the following medications: live virus vaccines This medicine may also interact with the following medications: medicines that treat or prevent blood clots like warfarin, enoxaparin, and dalteparin This list may not describe all possible  interactions. Give your health care provider a list of all the medicines, herbs, non-prescription drugs, or dietary supplements you use. Also tell them if you smoke, drink alcohol, or use illegal drugs. Some items may interact with your medicine. What should I watch for while using this medication? Visit your doctor for checks on your progress. This drug may make you feel generally unwell. This is not uncommon, as chemotherapy can affect healthy cells as well as cancer cells. Report any side effects. Continue your course of treatment even though you feel ill unless your doctor tells you to stop. In some cases, you may be given additional medicines to help with side effects. Follow all directions for their use. Call your doctor or health care professional for advice if you get a fever, chills or sore throat, or other symptoms of a cold or flu. Do not treat yourself. This drug decreases your body's ability to fight infections. Try to avoid being around people who are sick. This medicine may increase your risk to bruise or bleed. Call your doctor or health care professional if you notice any unusual bleeding. Be careful brushing and flossing your teeth or using a toothpick because you may get an infection or bleed more easily. If you have any dental work done, tell your dentist you are receiving this medicine. Avoid taking products that contain aspirin, acetaminophen, ibuprofen, naproxen, or ketoprofen unless instructed by your doctor. These medicines may hide a fever. Do not become pregnant while taking this medicine. Women should inform their doctor if they wish to become pregnant or think they might be pregnant. There is a potential for serious side effects to an unborn child. Talk to your health care   professional or pharmacist for more information. Do not breast-feed an infant while taking this medicine. Men should inform their doctor if they wish to father a child. This medicine may lower sperm  counts. Do not treat diarrhea with over the counter products. Contact your doctor if you have diarrhea that lasts more than 2 days or if it is severe and watery. This medicine can make you more sensitive to the sun. Keep out of the sun. If you cannot avoid being in the sun, wear protective clothing and use sunscreen. Do not use sun lamps or tanning beds/booths. What side effects may I notice from receiving this medication? Side effects that you should report to your doctor or health care professional as soon as possible: allergic reactions like skin rash, itching or hives, swelling of the face, lips, or tongue low blood counts - this medicine may decrease the number of white blood cells, red blood cells and platelets. You may be at increased risk for infections and bleeding. signs of infection - fever or chills, cough, sore throat, pain or difficulty passing urine signs of decreased platelets or bleeding - bruising, pinpoint red spots on the skin, black, tarry stools, blood in the urine signs of decreased red blood cells - unusually weak or tired, fainting spells, lightheadedness breathing problems changes in vision chest pain mouth sores nausea and vomiting pain, swelling, redness at site where injected pain, tingling, numbness in the hands or feet redness, swelling, or sores on hands or feet stomach pain unusual bleeding Side effects that usually do not require medical attention (report to your doctor or health care professional if they continue or are bothersome): changes in finger or toe nails diarrhea dry or itchy skin hair loss headache loss of appetite sensitivity of eyes to the light stomach upset unusually teary eyes This list may not describe all possible side effects. Call your doctor for medical advice about side effects. You may report side effects to FDA at 1-800-FDA-1088. Where should I keep my medication? This drug is given in a hospital or clinic and will not be  stored at home. NOTE: This sheet is a summary. It may not cover all possible information. If you have questions about this medicine, talk to your doctor, pharmacist, or health care provider.  2022 Elsevier/Gold Standard (2021-05-28 00:00:00)  

## 2021-08-21 ENCOUNTER — Inpatient Hospital Stay: Payer: Medicaid Other

## 2021-08-21 ENCOUNTER — Inpatient Hospital Stay (HOSPITAL_BASED_OUTPATIENT_CLINIC_OR_DEPARTMENT_OTHER): Payer: Medicaid Other | Admitting: Hematology & Oncology

## 2021-08-21 ENCOUNTER — Encounter: Payer: Self-pay | Admitting: *Deleted

## 2021-08-21 ENCOUNTER — Encounter: Payer: Self-pay | Admitting: Hematology & Oncology

## 2021-08-21 ENCOUNTER — Other Ambulatory Visit: Payer: Self-pay

## 2021-08-21 VITALS — BP 141/66 | HR 102 | Temp 98.2°F | Resp 20 | Wt 173.0 lb

## 2021-08-21 DIAGNOSIS — Z5111 Encounter for antineoplastic chemotherapy: Secondary | ICD-10-CM | POA: Diagnosis not present

## 2021-08-21 DIAGNOSIS — C187 Malignant neoplasm of sigmoid colon: Secondary | ICD-10-CM

## 2021-08-21 DIAGNOSIS — C772 Secondary and unspecified malignant neoplasm of intra-abdominal lymph nodes: Secondary | ICD-10-CM

## 2021-08-21 LAB — CMP (CANCER CENTER ONLY)
ALT: 19 U/L (ref 0–44)
AST: 17 U/L (ref 15–41)
Albumin: 4.3 g/dL (ref 3.5–5.0)
Alkaline Phosphatase: 50 U/L (ref 38–126)
Anion gap: 11 (ref 5–15)
BUN: 15 mg/dL (ref 8–23)
CO2: 25 mmol/L (ref 22–32)
Calcium: 9.9 mg/dL (ref 8.9–10.3)
Chloride: 103 mmol/L (ref 98–111)
Creatinine: 0.71 mg/dL (ref 0.44–1.00)
GFR, Estimated: >60 mL/min (ref >60)
Glucose, Bld: 167 mg/dL — ABNORMAL HIGH (ref 70–99)
Potassium: 4 mmol/L (ref 3.5–5.1)
Sodium: 139 mmol/L (ref 135–145)
Total Bilirubin: 0.4 mg/dL (ref 0.3–1.2)
Total Protein: 7.4 g/dL (ref 6.5–8.1)

## 2021-08-21 LAB — CBC WITH DIFFERENTIAL (CANCER CENTER ONLY)
Abs Immature Granulocytes: 0.05 10*3/uL (ref 0.00–0.07)
Basophils Absolute: 0.1 10*3/uL (ref 0.0–0.1)
Basophils Relative: 1 %
Eosinophils Absolute: 0.2 10*3/uL (ref 0.0–0.5)
Eosinophils Relative: 3 %
HCT: 37.8 % (ref 36.0–46.0)
Hemoglobin: 12.1 g/dL (ref 12.0–15.0)
Immature Granulocytes: 1 %
Lymphocytes Relative: 35 %
Lymphs Abs: 2 10*3/uL (ref 0.7–4.0)
MCH: 29.6 pg (ref 26.0–34.0)
MCHC: 32 g/dL (ref 30.0–36.0)
MCV: 92.4 fL (ref 80.0–100.0)
Monocytes Absolute: 0.9 10*3/uL (ref 0.1–1.0)
Monocytes Relative: 16 %
Neutro Abs: 2.5 10*3/uL (ref 1.7–7.7)
Neutrophils Relative %: 44 %
Platelet Count: 316 10*3/uL (ref 150–400)
RBC: 4.09 MIL/uL (ref 3.87–5.11)
RDW: 13.6 % (ref 11.5–15.5)
WBC Count: 5.6 10*3/uL (ref 4.0–10.5)
nRBC: 0 % (ref 0.0–0.2)

## 2021-08-21 LAB — CEA (IN HOUSE-CHCC): CEA (CHCC-In House): 17.48 ng/mL — ABNORMAL HIGH (ref 0.00–5.00)

## 2021-08-21 MED ORDER — KETOROLAC TROMETHAMINE 15 MG/ML IJ SOLN
30.0000 mg | Freq: Once | INTRAMUSCULAR | Status: AC
  Administered 2021-08-21: 30 mg via INTRAVENOUS
  Filled 2021-08-21: qty 2

## 2021-08-21 MED ORDER — PALONOSETRON HCL INJECTION 0.25 MG/5ML
0.2500 mg | Freq: Once | INTRAVENOUS | Status: AC
  Administered 2021-08-21: 0.25 mg via INTRAVENOUS
  Filled 2021-08-21: qty 5

## 2021-08-21 MED ORDER — ATROPINE SULFATE 1 MG/ML IV SOLN
0.5000 mg | Freq: Once | INTRAVENOUS | Status: AC | PRN
  Administered 2021-08-21: 0.5 mg via INTRAVENOUS
  Filled 2021-08-21: qty 1

## 2021-08-21 MED ORDER — SODIUM CHLORIDE 0.9 % IV SOLN
10.0000 mg | Freq: Once | INTRAVENOUS | Status: AC
  Administered 2021-08-21: 10 mg via INTRAVENOUS
  Filled 2021-08-21: qty 10

## 2021-08-21 MED ORDER — FLUOROURACIL CHEMO INJECTION 2.5 GM/50ML
400.0000 mg/m2 | Freq: Once | INTRAVENOUS | Status: AC
  Administered 2021-08-21: 750 mg via INTRAVENOUS
  Filled 2021-08-21: qty 15

## 2021-08-21 MED ORDER — SODIUM CHLORIDE 0.9 % IV SOLN
1920.0000 mg/m2 | INTRAVENOUS | Status: DC
  Administered 2021-08-21: 3600 mg via INTRAVENOUS
  Filled 2021-08-21: qty 72

## 2021-08-21 MED ORDER — SODIUM CHLORIDE 0.9 % IV SOLN: Freq: Once | INTRAVENOUS | Status: AC

## 2021-08-21 MED ORDER — SODIUM CHLORIDE 0.9 % IV SOLN
400.0000 mg/m2 | Freq: Once | INTRAVENOUS | Status: AC
  Administered 2021-08-21: 748 mg via INTRAVENOUS
  Filled 2021-08-21: qty 37.4

## 2021-08-21 MED ORDER — SODIUM CHLORIDE 0.9 % IV SOLN
144.0000 mg/m2 | Freq: Once | INTRAVENOUS | Status: AC
  Administered 2021-08-21: 260 mg via INTRAVENOUS
  Filled 2021-08-21: qty 5

## 2021-08-21 NOTE — Addendum Note (Signed)
Addended by: Volanda Napoleon on: 08/21/2021 09:38 AM   Modules accepted: Orders

## 2021-08-21 NOTE — Progress Notes (Unsigned)
[facility] notified via email that Dr. Marin Olp would like [facility] to be done on pathology results from 07/05/21.

## 2021-08-21 NOTE — Patient Instructions (Signed)

## 2021-08-21 NOTE — Patient Instructions (Signed)
[name]  Discharge Instructions: Thank you for choosing [facility] to provide your oncology and hematology care.   If you have a lab appointment with the [facility], please go directly to the [facility] and check in at the registration area.  Wear comfortable clothing and clothing appropriate for easy access to any Portacath or PICC line.   We strive to give you quality time with your provider. You may need to reschedule your appointment if you arrive late (15 or more minutes).  Arriving late affects you and other patients whose appointments are after yours.  Also, if you miss three or more appointments without notifying the office, you may be dismissed from the clinic at the provider's discretion.      For prescription refill requests, have your pharmacy contact our office and allow 72 hours for refills to be completed.    Today you received the following chemotherapy and/or immunotherapy agents FOLFIRI      To help prevent nausea and vomiting after your treatment, we encourage you to take your nausea medication as directed.  BELOW ARE SYMPTOMS THAT SHOULD BE REPORTED IMMEDIATELY: *FEVER GREATER THAN 100.4 F (38 C) OR HIGHER *CHILLS OR SWEATING *NAUSEA AND VOMITING THAT IS NOT CONTROLLED WITH YOUR NAUSEA MEDICATION *UNUSUAL SHORTNESS OF BREATH *UNUSUAL BRUISING OR BLEEDING *URINARY PROBLEMS (pain or burning when urinating, or frequent urination) *BOWEL PROBLEMS (unusual diarrhea, constipation, pain near the anus) TENDERNESS IN MOUTH AND THROAT WITH OR WITHOUT PRESENCE OF ULCERS (sore throat, sores in mouth, or a toothache) UNUSUAL RASH, SWELLING OR PAIN  UNUSUAL VAGINAL DISCHARGE OR ITCHING   Items with * indicate a potential emergency and should be followed up as soon as possible or go to the Emergency Department if any problems should occur.  Please show the CHEMOTHERAPY ALERT CARD or IMMUNOTHERAPY ALERT CARD at check-in to the  Emergency Department and triage nurse. Should you have questions after your visit or need to cancel or reschedule your appointment, please contact [facility]  [phone] and follow the prompts.  Office hours are 8:00 a.m. to 4:30 p.m. Monday - Friday. Please note that voicemails left after 4:00 p.m. may not be returned until the following business day.  We are closed weekends and major holidays. You have access to a nurse at all times for urgent questions. Please call the main number to the clinic [phone] and follow the prompts.  For any non-urgent questions, you may also contact your provider using MyChart. We now offer e-Visits for anyone 51 and older to request care online for non-urgent symptoms. For details visit [email or web address].   Also download the MyChart app! Go to the app store, search "MyChart", open the app, select , and log in with your MyChart username and password.  Due to Covid, a mask is required upon entering the hospital/clinic. If you do not have a mask, one will be given to you upon arrival. For doctor visits, patients may have 1 support person aged 66 or older with them. For treatment visits, patients cannot have anyone with them due to current Covid guidelines and our immunocompromised population.

## 2021-08-21 NOTE — Progress Notes (Signed)
Hematology and Oncology Follow Up Visit  Bailey Walker [id number] 10/04/1945 75 y.o. 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  So far, she is doing well.  She had her first cycle of chemotherapy.  She did okay with this.  She has had no problems with nausea or vomiting.  She had a good Thanksgiving.  She has been eating okay.  She has a colostomy.  This seems to be working okay.  She has had no bleeding.  There is been no problems with urine.  She has had no leg swelling.  She does have some chronic back issues.  Currently, I would say performance status is ECOG 1.    Medications:  Allergies as of 08/21/2021       Reactions   Nitroglycerin Nausea And Vomiting   Ampicillin Other (See Comments)   Per allergy test        Medication List        Accurate as of August 21, 2021  9:05 AM. If you have any questions, ask your nurse or doctor.          STOP taking these medications    cephALEXin 500 MG capsule Commonly known as: KEFLEX Stopped by: Volanda Napoleon, MD       TAKE these medications    acetaminophen 500 MG tablet Commonly known as: TYLENOL Take 500 mg by mouth in the morning and at bedtime.   alendronate 70 MG tablet Commonly known as: FOSAMAX Take 70 mg by mouth every Sunday.   aspirin EC 81 MG tablet Take 81 mg by mouth daily. Swallow whole.   atorvastatin 10 MG tablet Commonly known as: LIPITOR Take 2 tablets (20 mg total) by mouth daily.   [employer] Gentle Laxative 5 MG EC tablet Generic drug: bisacodyl Take 5 mg by mouth daily as needed.   [employer]S Purelax 17 GM/SCOOP  powder Generic drug: polyethylene glycol powder Take 17 g by mouth daily as needed.   dexamethasone 4 MG tablet Commonly known as: DECADRON Take 2 tablets (8 mg total) by mouth daily. Start the day after chemo for 2 days.   feeding supplement Liqd Take 237 mLs by mouth 3 (three) times daily between meals.   glipiZIDE 5 MG tablet Commonly known as: GLUCOTROL TAKE 1 TABLET BY MOUTH TWICE A DAY BEFORE MEALS   levothyroxine 75 MCG tablet Commonly known as: SYNTHROID TAKE 1 TABLET BY MOUTH EVERY DAY What changed: when to take this   loperamide 2 MG capsule Commonly known as: IMODIUM Take 2 capsules (4 mg total) by mouth as needed. Take 2 at diarrhea onset , then 1 every 2hr until 12hrs with no BM. May take 2 every 4hrs at night. If diarrhea recurs repeat.   metFORMIN 500 MG tablet Commonly known as: GLUCOPHAGE Take 0.5 tablets (250 mg total) by mouth 2 (two) times daily with a meal. What changed: how much to take   methocarbamol 500 MG tablet Commonly known as: ROBAXIN TAKE 1 TABLET BY MOUTH EVERY 12 HOURS AS NEEDED FOR MUSCLE SPASMS.   metoprolol tartrate 25 MG tablet Commonly known  as: LOPRESSOR Take 1 tablet (25 mg total) by mouth 2 (two) times daily.   morphine 15 MG 12 hr tablet Commonly known as: MS CONTIN Take 1 tablet (15 mg total) by mouth every 12 (twelve) hours.   morphine 15 MG tablet Commonly known as: MSIR Take 1 tablet (15 mg total) by mouth every 4 (four) hours as needed for severe pain.   multivitamin with minerals Tabs tablet Take 1 tablet by mouth daily.   ondansetron 8 MG tablet Commonly known as: ZOFRAN Take 1 tablet (8 mg total) by mouth 2 (two) times daily as needed for refractory nausea / vomiting. Start on day 3 after chemotherapy.   OVER THE COUNTER MEDICATION Take 5 mLs by mouth at bedtime. Mucosolve - used for cough (pt got from [place])   pantoprazole 20 MG tablet Commonly known as: Protonix Take 1 tablet (20 mg total) by mouth daily.    prednisoLONE acetate 1 % ophthalmic suspension Commonly known as: PRED FORTE Place 1 drop into both eyes every Monday.   prochlorperazine 10 MG tablet Commonly known as: COMPAZINE Take 1 tablet (10 mg total) by mouth every 6 (six) hours as needed (NAUSEA).   Vitamin D (Ergocalciferol) 1.25 MG (50000 UNIT) Caps capsule Commonly known as: DRISDOL Take 50,000 Units by mouth once a week.        Allergies:  Allergies  Allergen Reactions   Nitroglycerin Nausea And Vomiting   Ampicillin Other (See Comments)    Per allergy test    Past Medical History, Surgical history, Social history, and Family History were reviewed and updated.  Review of Systems: Review of Systems  HENT: Negative.    Eyes: Negative.   Respiratory:  Positive for shortness of breath.   Cardiovascular: Negative.   Gastrointestinal:  Positive for abdominal pain.  Genitourinary: Negative.   Musculoskeletal:  Positive for back pain.  Skin: Negative.   Neurological: Negative.   Endo/Heme/Allergies: Negative.   Psychiatric/Behavioral: Negative.      Physical Exam:  weight is 173 lb (78.5 kg). Her oral temperature is 98.2 F (36.8 C). Her blood pressure is 141/66 (abnormal) and her pulse is 102 (abnormal). Her respiration is 20 and oxygen saturation is 100%.   Wt Readings from Last 3 Encounters:  08/21/21 173 lb (78.5 kg)  07/29/21 177 lb (80.3 kg)  07/05/21 160 lb (72.6 kg)    Physical Exam Vitals reviewed.  HENT:     Head: Normocephalic and atraumatic.  Eyes:     Pupils: Pupils are equal, round, and reactive to light.  Cardiovascular:     Rate and Rhythm: Normal rate and regular rhythm.     Heart sounds: Normal heart sounds.  Pulmonary:     Effort: Pulmonary effort is normal.     Breath sounds: Normal breath sounds.  Abdominal:     General: Bowel sounds are normal.     Palpations: Abdomen is soft.     Comments: Her abdomen is slightly distended.  There is no obvious fluid wave.  The colostomy  is in the left lower quadrant.  She still has a dressing over the abdominal laparotomy scar.  She does not have any obvious erythema or warmth.  There is no exudate coming from the scars.  She has decent bowel sounds.  There is no palpable liver or spleen tip.  Musculoskeletal:        General: No tenderness or deformity. Normal range of motion.     Cervical back: Normal range of motion.  Lymphadenopathy:  Cervical: No cervical adenopathy.  Skin:    General: Skin is warm and dry.     Findings: No erythema or rash.  Neurological:     Mental Status: She is alert and oriented to person, place, and time.  Psychiatric:        Behavior: Behavior normal.        Thought Content: Thought content normal.        Judgment: Judgment normal.     Lab Results  Component Value Date   WBC 5.6 08/21/2021   HGB 12.1 08/21/2021   HCT 37.8 08/21/2021   MCV 92.4 08/21/2021   PLT 316 08/21/2021   Lab Results  Component Value Date   FERRITIN 77 07/29/2021   IRON 48 07/29/2021   TIBC 290 07/29/2021   UIBC 243 07/29/2021   IRONPCTSAT 16 (L) 07/29/2021   Lab Results  Component Value Date   RETICCTPCT 1.9 01/05/2020   RBC 4.09 08/21/2021   No results found for: KPAFRELGTCHN, LAMBDASER, KAPLAMBRATIO No results found for: IGGSERUM, IGA, IGMSERUM No results found for: Odetta Pink, SPEI   Chemistry      Component Value Date/Time   NA 137 07/29/2021 1040   NA 139 07/12/2019 1110   K 3.6 07/29/2021 1040   CL 103 07/29/2021 1040   CO2 25 07/29/2021 1040   BUN 13 07/29/2021 1040   BUN 15 07/12/2019 1110   CREATININE 0.59 07/29/2021 1040   CREATININE 0.71 06/11/2021 0952   CREATININE 0.74 01/08/2017 1100      Component Value Date/Time   CALCIUM 9.3 07/29/2021 1040   ALKPHOS 37 (L) 07/29/2021 1040   AST 14 (L) 07/29/2021 1040   AST 17 06/11/2021 0952   ALT 13 07/29/2021 1040   ALT 18 06/11/2021 0952   BILITOT 0.3 07/29/2021 1040    BILITOT 0.4 06/11/2021 0952       Impression and Plan: Ms. 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   She has recurrent disease.  We are treating with FOLFIRI.  Hopefully, this is working.  We will see what her tumor marker is.  We need to see what the molecular studies show.  I am not sure why they are taking so long to get back.  This will be her second cycle of treatment.  After 4 cycles, we will then rescan her.     Volanda Napoleon, MD 11/30/20229:05 AM

## 2021-08-23 ENCOUNTER — Other Ambulatory Visit: Payer: Self-pay

## 2021-08-23 ENCOUNTER — Other Ambulatory Visit: Payer: Self-pay | Admitting: *Deleted

## 2021-08-23 ENCOUNTER — Inpatient Hospital Stay: Payer: Medicaid Other | Attending: Gynecologic Oncology

## 2021-08-23 ENCOUNTER — Encounter: Payer: Self-pay | Admitting: *Deleted

## 2021-08-23 VITALS — BP 145/76 | HR 74 | Temp 98.0°F | Resp 19

## 2021-08-23 DIAGNOSIS — D510 Vitamin B12 deficiency anemia due to intrinsic factor deficiency: Secondary | ICD-10-CM | POA: Diagnosis not present

## 2021-08-23 DIAGNOSIS — C772 Secondary and unspecified malignant neoplasm of intra-abdominal lymph nodes: Secondary | ICD-10-CM

## 2021-08-23 DIAGNOSIS — C187 Malignant neoplasm of sigmoid colon: Secondary | ICD-10-CM

## 2021-08-23 DIAGNOSIS — Z5111 Encounter for antineoplastic chemotherapy: Secondary | ICD-10-CM | POA: Insufficient documentation

## 2021-08-23 MED ORDER — TRAMADOL HCL 50 MG PO TABS: 50.0000 mg | ORAL_TABLET | Freq: Four times a day (QID) | ORAL | 3 refills | Status: DC | PRN

## 2021-08-23 MED ORDER — HEPARIN SOD (PORK) LOCK FLUSH 100 UNIT/ML IV SOLN
500.0000 [IU] | Freq: Once | INTRAVENOUS | Status: AC | PRN
  Administered 2021-08-23: 500 [IU]

## 2021-08-23 MED ORDER — SODIUM CHLORIDE 0.9% FLUSH
10.0000 mL | INTRAVENOUS | Status: DC | PRN
  Administered 2021-08-23: 10 mL

## 2021-08-23 NOTE — Patient Instructions (Signed)
Fluorouracil, 5-FU injection What is this medication? FLUOROURACIL, 5-FU (flure oh YOOR a sil) is a chemotherapy drug. It slows the growth of cancer cells. This medicine is used to treat many types of cancer like breast cancer, colon or rectal cancer, pancreatic cancer, and stomach cancer. This medicine may be used for other purposes; ask your health care provider or pharmacist if you have questions. COMMON BRAND NAME(S): Adrucil What should I tell my care team before I take this medication? They need to know if you have any of these conditions: blood disorders dihydropyrimidine dehydrogenase (DPD) deficiency infection (especially a virus infection such as chickenpox, cold sores, or herpes) kidney disease liver disease malnourished, poor nutrition recent or ongoing radiation therapy an unusual or allergic reaction to fluorouracil, other chemotherapy, other medicines, foods, dyes, or preservatives pregnant or trying to get pregnant breast-feeding How should I use this medication? This drug is given as an infusion or injection into a vein. It is administered in a hospital or clinic by a specially trained health care professional. Talk to your pediatrician regarding the use of this medicine in children. Special care may be needed. Overdosage: If you think you have taken too much of this medicine contact a poison control center or emergency room at once. NOTE: This medicine is only for you. Do not share this medicine with others. What if I miss a dose? It is important not to miss your dose. Call your doctor or health care professional if you are unable to keep an appointment. What may interact with this medication? Do not take this medicine with any of the following medications: live virus vaccines This medicine may also interact with the following medications: medicines that treat or prevent blood clots like warfarin, enoxaparin, and dalteparin This list may not describe all possible  interactions. Give your health care provider a list of all the medicines, herbs, non-prescription drugs, or dietary supplements you use. Also tell them if you smoke, drink alcohol, or use illegal drugs. Some items may interact with your medicine. What should I watch for while using this medication? Visit your doctor for checks on your progress. This drug may make you feel generally unwell. This is not uncommon, as chemotherapy can affect healthy cells as well as cancer cells. Report any side effects. Continue your course of treatment even though you feel ill unless your doctor tells you to stop. In some cases, you may be given additional medicines to help with side effects. Follow all directions for their use. Call your doctor or health care professional for advice if you get a fever, chills or sore throat, or other symptoms of a cold or flu. Do not treat yourself. This drug decreases your body's ability to fight infections. Try to avoid being around people who are sick. This medicine may increase your risk to bruise or bleed. Call your doctor or health care professional if you notice any unusual bleeding. Be careful brushing and flossing your teeth or using a toothpick because you may get an infection or bleed more easily. If you have any dental work done, tell your dentist you are receiving this medicine. Avoid taking products that contain aspirin, acetaminophen, ibuprofen, naproxen, or ketoprofen unless instructed by your doctor. These medicines may hide a fever. Do not become pregnant while taking this medicine. Women should inform their doctor if they wish to become pregnant or think they might be pregnant. There is a potential for serious side effects to an unborn child. Talk to your health care   professional or pharmacist for more information. Do not breast-feed an infant while taking this medicine. Men should inform their doctor if they wish to father a child. This medicine may lower sperm  counts. Do not treat diarrhea with over the counter products. Contact your doctor if you have diarrhea that lasts more than 2 days or if it is severe and watery. This medicine can make you more sensitive to the sun. Keep out of the sun. If you cannot avoid being in the sun, wear protective clothing and use sunscreen. Do not use sun lamps or tanning beds/booths. What side effects may I notice from receiving this medication? Side effects that you should report to your doctor or health care professional as soon as possible: allergic reactions like skin rash, itching or hives, swelling of the face, lips, or tongue low blood counts - this medicine may decrease the number of white blood cells, red blood cells and platelets. You may be at increased risk for infections and bleeding. signs of infection - fever or chills, cough, sore throat, pain or difficulty passing urine signs of decreased platelets or bleeding - bruising, pinpoint red spots on the skin, black, tarry stools, blood in the urine signs of decreased red blood cells - unusually weak or tired, fainting spells, lightheadedness breathing problems changes in vision chest pain mouth sores nausea and vomiting pain, swelling, redness at site where injected pain, tingling, numbness in the hands or feet redness, swelling, or sores on hands or feet stomach pain unusual bleeding Side effects that usually do not require medical attention (report to your doctor or health care professional if they continue or are bothersome): changes in finger or toe nails diarrhea dry or itchy skin hair loss headache loss of appetite sensitivity of eyes to the light stomach upset unusually teary eyes This list may not describe all possible side effects. Call your doctor for medical advice about side effects. You may report side effects to FDA at 1-800-FDA-1088. Where should I keep my medication? This drug is given in a hospital or clinic and will not be  stored at home. NOTE: This sheet is a summary. It may not cover all possible information. If you have questions about this medicine, talk to your doctor, pharmacist, or health care provider.  2022 Elsevier/Gold Standard (2021-05-28 00:00:00)  

## 2021-09-02 ENCOUNTER — Encounter: Payer: Self-pay | Admitting: Hematology & Oncology

## 2021-09-02 ENCOUNTER — Other Ambulatory Visit: Payer: Self-pay

## 2021-09-02 ENCOUNTER — Inpatient Hospital Stay: Payer: Medicaid Other

## 2021-09-02 ENCOUNTER — Encounter (HOSPITAL_COMMUNITY): Payer: Self-pay

## 2021-09-02 ENCOUNTER — Inpatient Hospital Stay (HOSPITAL_BASED_OUTPATIENT_CLINIC_OR_DEPARTMENT_OTHER): Payer: Medicaid Other | Admitting: Hematology & Oncology

## 2021-09-02 VITALS — BP 160/65 | HR 103 | Temp 97.5°F | Resp 18 | Wt 171.1 lb

## 2021-09-02 DIAGNOSIS — C772 Secondary and unspecified malignant neoplasm of intra-abdominal lymph nodes: Secondary | ICD-10-CM | POA: Diagnosis not present

## 2021-09-02 DIAGNOSIS — Z5111 Encounter for antineoplastic chemotherapy: Secondary | ICD-10-CM | POA: Diagnosis not present

## 2021-09-02 DIAGNOSIS — C187 Malignant neoplasm of sigmoid colon: Secondary | ICD-10-CM

## 2021-09-02 LAB — IRON AND TIBC
Iron: 64 ug/dL (ref 41–142)
Saturation Ratios: 22 % (ref 21–57)
TIBC: 291 ug/dL (ref 236–444)
UIBC: 227 ug/dL (ref 120–384)

## 2021-09-02 LAB — CBC WITH DIFFERENTIAL (CANCER CENTER ONLY)
Abs Immature Granulocytes: 0.02 10*3/uL (ref 0.00–0.07)
Basophils Absolute: 0 10*3/uL (ref 0.0–0.1)
Basophils Relative: 0 %
Eosinophils Absolute: 0.1 10*3/uL (ref 0.0–0.5)
Eosinophils Relative: 2 %
HCT: 34.9 % — ABNORMAL LOW (ref 36.0–46.0)
Hemoglobin: 11.4 g/dL — ABNORMAL LOW (ref 12.0–15.0)
Immature Granulocytes: 0 %
Lymphocytes Relative: 24 %
Lymphs Abs: 1.6 10*3/uL (ref 0.7–4.0)
MCH: 29.9 pg (ref 26.0–34.0)
MCHC: 32.7 g/dL (ref 30.0–36.0)
MCV: 91.6 fL (ref 80.0–100.0)
Monocytes Absolute: 0.6 10*3/uL (ref 0.1–1.0)
Monocytes Relative: 9 %
Neutro Abs: 4.3 10*3/uL (ref 1.7–7.7)
Neutrophils Relative %: 65 %
Platelet Count: 173 10*3/uL (ref 150–400)
RBC: 3.81 MIL/uL — ABNORMAL LOW (ref 3.87–5.11)
RDW: 13.8 % (ref 11.5–15.5)
WBC Count: 6.6 10*3/uL (ref 4.0–10.5)
nRBC: 0 % (ref 0.0–0.2)

## 2021-09-02 LAB — CMP (CANCER CENTER ONLY)
ALT: 23 U/L (ref 0–44)
AST: 19 U/L (ref 15–41)
Albumin: 4 g/dL (ref 3.5–5.0)
Alkaline Phosphatase: 47 U/L (ref 38–126)
Anion gap: 12 (ref 5–15)
BUN: 14 mg/dL (ref 8–23)
CO2: 22 mmol/L (ref 22–32)
Calcium: 9.4 mg/dL (ref 8.9–10.3)
Chloride: 103 mmol/L (ref 98–111)
Creatinine: 0.75 mg/dL (ref 0.44–1.00)
GFR, Estimated: >60 mL/min (ref >60)
Glucose, Bld: 164 mg/dL — ABNORMAL HIGH (ref 70–99)
Potassium: 3.5 mmol/L (ref 3.5–5.1)
Sodium: 137 mmol/L (ref 135–145)
Total Bilirubin: 0.4 mg/dL (ref 0.3–1.2)
Total Protein: 6.6 g/dL (ref 6.5–8.1)

## 2021-09-02 LAB — CEA (IN HOUSE-CHCC): CEA (CHCC-In House): 18.52 ng/mL — ABNORMAL HIGH (ref 0.00–5.00)

## 2021-09-02 LAB — FERRITIN: Ferritin: 377 ng/mL — ABNORMAL HIGH (ref 11–307)

## 2021-09-02 LAB — LACTATE DEHYDROGENASE: LDH: 130 U/L (ref 98–192)

## 2021-09-02 MED ORDER — SODIUM CHLORIDE 0.9 % IV SOLN
Freq: Once | INTRAVENOUS | Status: AC
Start: 2021-09-02 — End: 2021-09-02

## 2021-09-02 MED ORDER — PALONOSETRON HCL INJECTION 0.25 MG/5ML
0.2500 mg | Freq: Once | INTRAVENOUS | Status: AC
  Administered 2021-09-02: 0.25 mg via INTRAVENOUS

## 2021-09-02 MED ORDER — SODIUM CHLORIDE 0.9 % IV SOLN
400.0000 mg/m2 | Freq: Once | INTRAVENOUS | Status: AC
  Administered 2021-09-02: 748 mg via INTRAVENOUS
  Filled 2021-09-02: qty 37.4

## 2021-09-02 MED ORDER — ATROPINE SULFATE 1 MG/ML IV SOLN
0.5000 mg | Freq: Once | INTRAVENOUS | Status: AC | PRN
  Administered 2021-09-02: 0.5 mg via INTRAVENOUS

## 2021-09-02 MED ORDER — FLUOROURACIL CHEMO INJECTION 2.5 GM/50ML
400.0000 mg/m2 | Freq: Once | INTRAVENOUS | Status: AC
  Administered 2021-09-02: 750 mg via INTRAVENOUS
  Filled 2021-09-02: qty 15

## 2021-09-02 MED ORDER — SODIUM CHLORIDE 0.9 % IV SOLN
10.0000 mg | Freq: Once | INTRAVENOUS | Status: AC
  Administered 2021-09-02: 10 mg via INTRAVENOUS
  Filled 2021-09-02: qty 10

## 2021-09-02 MED ORDER — SODIUM CHLORIDE 0.9 % IV SOLN
144.0000 mg/m2 | Freq: Once | INTRAVENOUS | Status: AC
  Administered 2021-09-02: 260 mg via INTRAVENOUS
  Filled 2021-09-02: qty 5

## 2021-09-02 MED ORDER — SODIUM CHLORIDE 0.9 % IV SOLN: Freq: Once | INTRAVENOUS | Status: AC

## 2021-09-02 MED ORDER — SODIUM CHLORIDE 0.9 % IV SOLN
1920.0000 mg/m2 | INTRAVENOUS | Status: DC
  Administered 2021-09-02: 3600 mg via INTRAVENOUS
  Filled 2021-09-02: qty 72

## 2021-09-02 NOTE — Patient Instructions (Signed)
[facility]  Discharge Instructions: Thank you for choosing [facility] to provide your oncology and hematology care.   If you have a lab appointment with the [facility], please go directly to the [facility] and check in at the registration area.  Wear comfortable clothing and clothing appropriate for easy access to any Portacath or PICC line.   We strive to give you quality time with your provider. You may need to reschedule your appointment if you arrive late (15 or more minutes).  Arriving late affects you and other patients whose appointments are after yours.  Also, if you miss three or more appointments without notifying the office, you may be dismissed from the clinic at the provider's discretion.      For prescription refill requests, have your pharmacy contact our office and allow 72 hours for refills to be completed.    Today you received the following chemotherapy and/or immunotherapy agents 5-fu, leucovorin,cpt-11    To help prevent nausea and vomiting after your treatment, we encourage you to take your nausea medication as directed.  BELOW ARE SYMPTOMS THAT SHOULD BE REPORTED IMMEDIATELY: *FEVER GREATER THAN 100.4 F (38 C) OR HIGHER *CHILLS OR SWEATING *NAUSEA AND VOMITING THAT IS NOT CONTROLLED WITH YOUR NAUSEA MEDICATION *UNUSUAL SHORTNESS OF BREATH *UNUSUAL BRUISING OR BLEEDING *URINARY PROBLEMS (pain or burning when urinating, or frequent urination) *BOWEL PROBLEMS (unusual diarrhea, constipation, pain near the anus) TENDERNESS IN MOUTH AND THROAT WITH OR WITHOUT PRESENCE OF ULCERS (sore throat, sores in mouth, or a toothache) UNUSUAL RASH, SWELLING OR PAIN  UNUSUAL VAGINAL DISCHARGE OR ITCHING   Items with * indicate a potential emergency and should be followed up as soon as possible or go to the Emergency Department if any problems should occur.  Please show the CHEMOTHERAPY ALERT CARD or IMMUNOTHERAPY ALERT CARD at  check-in to the Emergency Department and triage nurse. Should you have questions after your visit or need to cancel or reschedule your appointment, please contact [facility]  [phone] and follow the prompts.  Office hours are 8:00 a.m. to 4:30 p.m. Monday - Friday. Please note that voicemails left after 4:00 p.m. may not be returned until the following business day.  We are closed weekends and major holidays. You have access to a nurse at all times for urgent questions. Please call the main number to the clinic [phone] and follow the prompts.  For any non-urgent questions, you may also contact your provider using MyChart. We now offer e-Visits for anyone 3 and older to request care online for non-urgent symptoms. For details visit [email or web address].   Also download the MyChart app! Go to the app store, search "MyChart", open the app, select [facility], and log in with your MyChart username and password.  Due to Covid, a mask is required upon entering the hospital/clinic. If you do not have a mask, one will be given to you upon arrival. For doctor visits, patients may have 1 support person aged 63 or older with them. For treatment visits, patients cannot have anyone with them due to current Covid guidelines and our immunocompromised population.

## 2021-09-02 NOTE — Progress Notes (Signed)
Hematology and Oncology Follow Up Visit  Bailey Walker [id number] September 18, 1946 75 y.o. 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  She has had 2 cycles of chemotherapy.  Unfortunately, her CEA level really has not come down like I thought it would.  His CEA level today was 18.  She is eating okay.  She is having no nausea or vomiting.  She is having no problems with bleeding.  Colostomy seems to be working okay..  She is having some back discomfort.  I think this might be some arthritic issues.  She is having no cough or shortness of breath.  There is no fever.  She has had no leg swelling.  She has had no urinary difficulty.  Overall, I would say performance status is probably ECOG 1.  Iece.      Medications:  Allergies as of 09/02/2021       Reactions   Nitroglycerin Nausea And Vomiting   Ampicillin Other (See Comments)   Per allergy test        Medication List        Accurate as of September 02, 2021  9:11 AM. If you have any questions, ask your nurse or doctor.          acetaminophen 500 MG tablet Commonly known as: TYLENOL Take 500 mg by mouth in the morning and at bedtime.   alendronate 70 MG tablet Commonly known as: FOSAMAX Take 70 mg by mouth every Sunday.   aspirin EC 81 MG tablet Take 81 mg by mouth daily. Swallow whole.   atorvastatin 10 MG tablet Commonly known as: LIPITOR Take 2 tablets (20 mg total) by mouth daily.   CVS Gentle Laxative 5 MG EC tablet Generic drug: bisacodyl Take 5 mg by mouth daily as needed.   CVS Purelax 17 GM/SCOOP powder Generic drug: polyethylene  glycol powder Take 17 g by mouth daily as needed.   dexamethasone 4 MG tablet Commonly known as: DECADRON Take 2 tablets (8 mg total) by mouth daily. Start the day after chemo for 2 days.   feeding supplement Liqd Take 237 mLs by mouth 3 (three) times daily between meals.   glipiZIDE 5 MG tablet Commonly known as: GLUCOTROL TAKE 1 TABLET BY MOUTH TWICE A DAY BEFORE MEALS   levothyroxine 75 MCG tablet Commonly known as: SYNTHROID TAKE 1 TABLET BY MOUTH EVERY DAY What changed: when to take this   loperamide 2 MG capsule Commonly known as: IMODIUM Take 2 capsules (4 mg total) by mouth as needed. Take 2 at diarrhea onset , then 1 every 2hr until 12hrs with no BM. May take 2 every 4hrs at night. If diarrhea recurs repeat.   metFORMIN 500 MG tablet Commonly known as: GLUCOPHAGE Take 0.5 tablets (250 mg total) by mouth 2 (two) times daily with a meal. What changed: how much to take   methocarbamol 500 MG tablet Commonly known as: ROBAXIN TAKE 1 TABLET BY MOUTH EVERY 12 HOURS AS NEEDED FOR MUSCLE SPASMS.   metoprolol tartrate 25 MG tablet Commonly known as: LOPRESSOR Take 1 tablet (25 mg total) by  mouth 2 (two) times daily.   morphine 15 MG 12 hr tablet Commonly known as: MS CONTIN Take 1 tablet (15 mg total) by mouth every 12 (twelve) hours.   morphine 15 MG tablet Commonly known as: MSIR Take 1 tablet (15 mg total) by mouth every 4 (four) hours as needed for severe pain.   multivitamin with minerals Tabs tablet Take 1 tablet by mouth daily.   ondansetron 8 MG tablet Commonly known as: ZOFRAN Take 1 tablet (8 mg total) by mouth 2 (two) times daily as needed for refractory nausea / vomiting. Start on day 3 after chemotherapy.   OVER THE COUNTER MEDICATION Take 5 mLs by mouth at bedtime. Mucosolve - used for cough (pt got from [place])   pantoprazole 20 MG tablet Commonly known as: Protonix Take 1 tablet (20 mg total) by mouth daily.   prednisoLONE acetate 1 %  ophthalmic suspension Commonly known as: PRED FORTE Place 1 drop into both eyes every Monday.   prochlorperazine 10 MG tablet Commonly known as: COMPAZINE Take 1 tablet (10 mg total) by mouth every 6 (six) hours as needed (NAUSEA).   traMADol 50 MG tablet Commonly known as: ULTRAM Take 1 tablet (50 mg total) by mouth every 6 (six) hours as needed.   Vitamin D (Ergocalciferol) 1.25 MG (50000 UNIT) Caps capsule Commonly known as: DRISDOL Take 50,000 Units by mouth once a week.        Allergies:  Allergies  Allergen Reactions   Nitroglycerin Nausea And Vomiting   Ampicillin Other (See Comments)    Per allergy test    Past Medical History, Surgical history, Social history, and Family History were reviewed and updated.  Review of Systems: Review of Systems  HENT: Negative.    Eyes: Negative.   Respiratory:  Positive for shortness of breath.   Cardiovascular: Negative.   Gastrointestinal:  Positive for abdominal pain.  Genitourinary: Negative.   Musculoskeletal:  Positive for back pain.  Skin: Negative.   Neurological: Negative.   Endo/Heme/Allergies: Negative.   Psychiatric/Behavioral: Negative.      Physical Exam:  weight is 171 lb 1.3 oz (77.6 kg). Her oral temperature is 97.5 F (36.4 C) (abnormal). Her blood pressure is 160/65 (abnormal) and her pulse is 103 (abnormal). Her respiration is 18 and oxygen saturation is 99%.   Wt Readings from Last 3 Encounters:  09/02/21 171 lb 1.3 oz (77.6 kg)  08/21/21 173 lb (78.5 kg)  07/29/21 177 lb (80.3 kg)    Physical Exam Vitals reviewed.  HENT:     Head: Normocephalic and atraumatic.  Eyes:     Pupils: Pupils are equal, round, and reactive to light.  Cardiovascular:     Rate and Rhythm: Normal rate and regular rhythm.     Heart sounds: Normal heart sounds.  Pulmonary:     Effort: Pulmonary effort is normal.     Breath sounds: Normal breath sounds.  Abdominal:     General: Bowel sounds are normal.      Palpations: Abdomen is soft.     Comments: Her abdomen is slightly distended.  There is no obvious fluid wave.  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  She still has a dressing over the abdominal laparotomy scar.  She does not have any obvious erythema or warmth.  There is no exudate coming from the scars.  She has decent bowel sounds.  There is no palpable liver or spleen tip.  Musculoskeletal:        General:  No tenderness or deformity. Normal range of motion.     Cervical back: Normal range of motion.  Lymphadenopathy:     Cervical: No cervical adenopathy.  Skin:    General: Skin is warm and dry.     Findings: No erythema or rash.  Neurological:     Mental Status: She is alert and oriented to person, place, and time.  Psychiatric:        Behavior: Behavior normal.        Thought Content: Thought content normal.        Judgment: Judgment normal.     Lab Results  Component Value Date   WBC 6.6 09/02/2021   HGB 11.4 (L) 09/02/2021   HCT 34.9 (L) 09/02/2021   MCV 91.6 09/02/2021   PLT 173 09/02/2021   Lab Results  Component Value Date   FERRITIN 77 07/29/2021   IRON 48 07/29/2021   TIBC 290 07/29/2021   UIBC 243 07/29/2021   IRONPCTSAT 16 (L) 07/29/2021   Lab Results  Component Value Date   RETICCTPCT 1.9 01/05/2020   RBC 3.81 (L) 09/02/2021   No results found for: KPAFRELGTCHN, LAMBDASER, KAPLAMBRATIO No results found for: IGGSERUM, IGA, IGMSERUM No results found for: Odetta Pink, SPEI   Chemistry      Component Value Date/Time   NA 137 09/02/2021 0822   NA 139 07/12/2019 1110   K 3.5 09/02/2021 0822   CL 103 09/02/2021 0822   CO2 22 09/02/2021 0822   BUN 14 09/02/2021 0822   BUN 15 07/12/2019 1110   CREATININE 0.75 09/02/2021 0822   CREATININE 0.74 01/08/2017 1100      Component Value Date/Time   CALCIUM 9.4 09/02/2021 0822   ALKPHOS 47 09/02/2021 0822   AST 19 09/02/2021 0822   ALT 23 09/02/2021  0822   BILITOT 0.4 09/02/2021 1027       Impression and Plan: Bailey Walker is a very pleasant 75 yo Kurdish female with stage IIIB ([id number]) 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   She has recurrent disease.  We are treating with FOLFIRI.  It is hard to say how well this is doing right now.  I really would have thought that we would be seeing a decrease in the CEA level.  I still want to wait after her fourth cycle of treatment before we do our scans.  We will proceed with her third cycle today.    Volanda Napoleon, MD 12/12/20229:11 AM

## 2021-09-02 NOTE — Patient Instructions (Signed)

## 2021-09-04 ENCOUNTER — Inpatient Hospital Stay: Payer: Medicaid Other

## 2021-09-04 ENCOUNTER — Other Ambulatory Visit: Payer: Self-pay

## 2021-09-04 VITALS — BP 145/61 | HR 79 | Temp 95.0°F | Resp 18

## 2021-09-04 DIAGNOSIS — Z5111 Encounter for antineoplastic chemotherapy: Secondary | ICD-10-CM | POA: Diagnosis not present

## 2021-09-04 DIAGNOSIS — C772 Secondary and unspecified malignant neoplasm of intra-abdominal lymph nodes: Secondary | ICD-10-CM

## 2021-09-04 MED ORDER — HEPARIN SOD (PORK) LOCK FLUSH 100 UNIT/ML IV SOLN
500.0000 [IU] | Freq: Once | INTRAVENOUS | Status: AC | PRN
  Administered 2021-09-04: 11:00:00 500 [IU]

## 2021-09-04 MED ORDER — SODIUM CHLORIDE 0.9% FLUSH
10.0000 mL | INTRAVENOUS | Status: DC | PRN
  Administered 2021-09-04: 11:00:00 10 mL

## 2021-09-04 NOTE — Patient Instructions (Signed)
[facility]  Discharge Instructions: Thank you for choosing [facility] to provide your oncology and hematology care.   If you have a lab appointment with the [facility], please go directly to the [facility] and check in at the registration area.  Wear comfortable clothing and clothing appropriate for easy access to any Portacath or PICC line.   We strive to give you quality time with your provider. You may need to reschedule your appointment if you arrive late (15 or more minutes).  Arriving late affects you and other patients whose appointments are after yours.  Also, if you miss three or more appointments without notifying the office, you may be dismissed from the clinic at the providers discretion.      For prescription refill requests, have your pharmacy contact our office and allow 72 hours for refills to be completed.    Today you received the following chemotherapy and/or immunotherapy agents port flush    To help prevent nausea and vomiting after your treatment, we encourage you to take your nausea medication as directed.  BELOW ARE SYMPTOMS THAT SHOULD BE REPORTED IMMEDIATELY: *FEVER GREATER THAN 100.4 F (38 C) OR HIGHER *CHILLS OR SWEATING *NAUSEA AND VOMITING THAT IS NOT CONTROLLED WITH YOUR NAUSEA MEDICATION *UNUSUAL SHORTNESS OF BREATH *UNUSUAL BRUISING OR BLEEDING *URINARY PROBLEMS (pain or burning when urinating, or frequent urination) *BOWEL PROBLEMS (unusual diarrhea, constipation, pain near the anus) TENDERNESS IN MOUTH AND THROAT WITH OR WITHOUT PRESENCE OF ULCERS (sore throat, sores in mouth, or a toothache) UNUSUAL RASH, SWELLING OR PAIN  UNUSUAL VAGINAL DISCHARGE OR ITCHING   Items with * indicate a potential emergency and should be followed up as soon as possible or go to the Emergency Department if any problems should occur.  Please show the CHEMOTHERAPY ALERT CARD or IMMUNOTHERAPY ALERT CARD at check-in to the  Emergency Department and triage nurse. Should you have questions after your visit or need to cancel or reschedule your appointment, please contact [facility]  [phone] and follow the prompts.  Office hours are 8:00 a.m. to 4:30 p.m. Monday - Friday. Please note that voicemails left after 4:00 p.m. may not be returned until the following business day.  We are closed weekends and major holidays. You have access to a nurse at all times for urgent questions. Please call the main number to the clinic [phone] and follow the prompts.  For any non-urgent questions, you may also contact your provider using MyChart. We now offer e-Visits for anyone 77 and older to request care online for non-urgent symptoms. For details visit [email or web address].   Also download the MyChart app! Go to the app store, search "MyChart", open the app, select [facility], and log in with your MyChart username and password.  Due to Covid, a mask is required upon entering the hospital/clinic. If you do not have a mask, one will be given to you upon arrival. For doctor visits, patients may have 1 support person aged 77 or older with them. For treatment visits, patients cannot have anyone with them due to current Covid guidelines and our immunocompromised population.

## 2021-09-13 ENCOUNTER — Other Ambulatory Visit: Payer: Medicaid Other

## 2021-09-17 ENCOUNTER — Inpatient Hospital Stay: Payer: Medicaid Other

## 2021-09-17 ENCOUNTER — Inpatient Hospital Stay: Payer: Medicaid Other | Admitting: Hematology & Oncology

## 2021-09-19 ENCOUNTER — Inpatient Hospital Stay: Payer: Medicaid Other

## 2021-09-20 ENCOUNTER — Other Ambulatory Visit: Payer: Self-pay

## 2021-09-20 ENCOUNTER — Encounter: Payer: Self-pay | Admitting: Hematology & Oncology

## 2021-09-20 ENCOUNTER — Inpatient Hospital Stay: Payer: Medicaid Other

## 2021-09-20 ENCOUNTER — Ambulatory Visit: Payer: Medicaid Other

## 2021-09-20 ENCOUNTER — Inpatient Hospital Stay (HOSPITAL_BASED_OUTPATIENT_CLINIC_OR_DEPARTMENT_OTHER): Payer: Medicaid Other | Admitting: Hematology & Oncology

## 2021-09-20 VITALS — BP 135/57 | HR 86 | Temp 98.5°F | Resp 17 | Ht 62.0 in | Wt 173.0 lb

## 2021-09-20 DIAGNOSIS — C187 Malignant neoplasm of sigmoid colon: Secondary | ICD-10-CM | POA: Diagnosis not present

## 2021-09-20 DIAGNOSIS — Z5111 Encounter for antineoplastic chemotherapy: Secondary | ICD-10-CM | POA: Diagnosis not present

## 2021-09-20 DIAGNOSIS — C772 Secondary and unspecified malignant neoplasm of intra-abdominal lymph nodes: Secondary | ICD-10-CM

## 2021-09-20 DIAGNOSIS — Z95828 Presence of other vascular implants and grafts: Secondary | ICD-10-CM

## 2021-09-20 LAB — IRON AND IRON BINDING CAPACITY (CC-WL,HP ONLY)
Iron: 61 ug/dL (ref 28–170)
Saturation Ratios: 19 % (ref 10.4–31.8)
TIBC: 329 ug/dL (ref 250–450)
UIBC: 268 ug/dL (ref 148–442)

## 2021-09-20 LAB — CMP (CANCER CENTER ONLY)
ALT: 21 U/L (ref 0–44)
AST: 18 U/L (ref 15–41)
Albumin: 3.7 g/dL (ref 3.5–5.0)
Alkaline Phosphatase: 48 U/L (ref 38–126)
Anion gap: 12 (ref 5–15)
BUN: 10 mg/dL (ref 8–23)
CO2: 24 mmol/L (ref 22–32)
Calcium: 9.2 mg/dL (ref 8.9–10.3)
Chloride: 102 mmol/L (ref 98–111)
Creatinine: 0.68 mg/dL (ref 0.44–1.00)
GFR, Estimated: >60 mL/min (ref >60)
Glucose, Bld: 137 mg/dL — ABNORMAL HIGH (ref 70–99)
Potassium: 3.6 mmol/L (ref 3.5–5.1)
Sodium: 138 mmol/L (ref 135–145)
Total Bilirubin: 0.4 mg/dL (ref 0.3–1.2)
Total Protein: 6.4 g/dL — ABNORMAL LOW (ref 6.5–8.1)

## 2021-09-20 LAB — CBC WITH DIFFERENTIAL (CANCER CENTER ONLY)
Abs Immature Granulocytes: 0.09 10*3/uL — ABNORMAL HIGH (ref 0.00–0.07)
Basophils Absolute: 0.1 10*3/uL (ref 0.0–0.1)
Basophils Relative: 1 %
Eosinophils Absolute: 0.2 10*3/uL (ref 0.0–0.5)
Eosinophils Relative: 5 %
HCT: 32.2 % — ABNORMAL LOW (ref 36.0–46.0)
Hemoglobin: 10.3 g/dL — ABNORMAL LOW (ref 12.0–15.0)
Immature Granulocytes: 2 %
Lymphocytes Relative: 32 %
Lymphs Abs: 1.5 10*3/uL (ref 0.7–4.0)
MCH: 29.9 pg (ref 26.0–34.0)
MCHC: 32 g/dL (ref 30.0–36.0)
MCV: 93.3 fL (ref 80.0–100.0)
Monocytes Absolute: 0.9 10*3/uL (ref 0.1–1.0)
Monocytes Relative: 20 %
Neutro Abs: 1.8 10*3/uL (ref 1.7–7.7)
Neutrophils Relative %: 40 %
Platelet Count: 280 10*3/uL (ref 150–400)
RBC: 3.45 MIL/uL — ABNORMAL LOW (ref 3.87–5.11)
RDW: 15.5 % (ref 11.5–15.5)
WBC Count: 4.6 10*3/uL (ref 4.0–10.5)
nRBC: 0 % (ref 0.0–0.2)

## 2021-09-20 LAB — CEA (IN HOUSE-CHCC): CEA (CHCC-In House): 10.85 ng/mL — ABNORMAL HIGH (ref 0.00–5.00)

## 2021-09-20 LAB — FERRITIN: Ferritin: 306 ng/mL (ref 11–307)

## 2021-09-20 MED ORDER — SODIUM CHLORIDE 0.9% FLUSH
10.0000 mL | Freq: Once | INTRAVENOUS | Status: AC
  Administered 2021-09-20: 09:00:00 10 mL via INTRAVENOUS

## 2021-09-20 MED ORDER — HEPARIN SOD (PORK) LOCK FLUSH 100 UNIT/ML IV SOLN
500.0000 [IU] | Freq: Once | INTRAVENOUS | Status: AC
  Administered 2021-09-20: 09:00:00 500 [IU] via INTRAVENOUS

## 2021-09-20 NOTE — Patient Instructions (Signed)

## 2021-09-20 NOTE — Progress Notes (Signed)
Hematology and Oncology Follow Up Visit  Bailey Walker [id number] 1945-11-21 75 y.o. 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  She is doing fairly well.  She is got have a nice New Year's Eve.  They apparently are going to a party.  Her CEA just does not come down like I thought it would.  Her last CEA was holding steady at 18.5.  She has had no abdominal pain.  She has had some back discomfort.  She has had no cough.  She has had no leg swelling.  Her colostomy is working pretty well.  She has had no bleeding.  She has had no fever.  She still has abdominal wound.  This is dressed.  This seems to be healing up.  Overall, I would say her performance status is probably ECOG 1.     Medications:  Allergies as of 09/20/2021       Reactions   Nitroglycerin Nausea And Vomiting   Ampicillin Other (See Comments)   Per allergy test        Medication List        Accurate as of September 20, 2021  8:57 AM. If you have any questions, ask your nurse or doctor.          acetaminophen 500 MG tablet Commonly known as: TYLENOL Take 500 mg by mouth in the morning and at bedtime.   alendronate 70 MG tablet Commonly known as: FOSAMAX Take 70 mg by mouth every Sunday.   aspirin EC 81 MG tablet Take 81 mg by mouth daily. Swallow whole.   atorvastatin 10 MG tablet Commonly known as: LIPITOR Take 2 tablets (20 mg total) by mouth daily.   [employer] Gentle Laxative 5 MG EC tablet Generic drug: bisacodyl Take 5 mg by mouth daily as needed.   [employer]S Purelax 17 GM/SCOOP powder Generic drug: polyethylene glycol  powder Take 17 g by mouth daily as needed.   dexamethasone 4 MG tablet Commonly known as: DECADRON Take 2 tablets (8 mg total) by mouth daily. Start the day after chemo for 2 days.   feeding supplement Liqd Take 237 mLs by mouth 3 (three) times daily between meals.   glipiZIDE 5 MG tablet Commonly known as: GLUCOTROL TAKE 1 TABLET BY MOUTH TWICE A DAY BEFORE MEALS   levothyroxine 75 MCG tablet Commonly known as: SYNTHROID TAKE 1 TABLET BY MOUTH EVERY DAY What changed: when to take this   loperamide 2 MG capsule Commonly known as: IMODIUM Take 2 capsules (4 mg total) by mouth as needed. Take 2 at diarrhea onset , then 1 every 2hr until 12hrs with no BM. May take 2 every 4hrs at night. If diarrhea recurs repeat.   metFORMIN 500 MG tablet Commonly known as: GLUCOPHAGE Take 0.5 tablets (250 mg total) by mouth 2 (two) times daily with a meal. What changed: how much to take   methocarbamol 500 MG tablet Commonly known as: ROBAXIN TAKE 1 TABLET BY MOUTH EVERY 12 HOURS AS NEEDED FOR MUSCLE SPASMS.   metoprolol tartrate 25 MG tablet Commonly known as: LOPRESSOR Take 1 tablet (  25 mg total) by mouth 2 (two) times daily.   morphine 15 MG 12 hr tablet Commonly known as: MS CONTIN Take 1 tablet (15 mg total) by mouth every 12 (twelve) hours.   morphine 15 MG tablet Commonly known as: MSIR Take 1 tablet (15 mg total) by mouth every 4 (four) hours as needed for severe pain.   multivitamin with minerals Tabs tablet Take 1 tablet by mouth daily.   ondansetron 8 MG tablet Commonly known as: ZOFRAN Take 1 tablet (8 mg total) by mouth 2 (two) times daily as needed for refractory nausea / vomiting. Start on day 3 after chemotherapy.   OVER THE COUNTER MEDICATION Take 5 mLs by mouth at bedtime. Mucosolve - used for cough (pt got from [place])   pantoprazole 20 MG tablet Commonly known as: Protonix Take 1 tablet (20 mg total) by mouth daily.   prednisoLONE acetate 1 % ophthalmic  suspension Commonly known as: PRED FORTE Place 1 drop into both eyes every Monday.   prochlorperazine 10 MG tablet Commonly known as: COMPAZINE Take 1 tablet (10 mg total) by mouth every 6 (six) hours as needed (NAUSEA).   traMADol 50 MG tablet Commonly known as: ULTRAM Take 1 tablet (50 mg total) by mouth every 6 (six) hours as needed.   Vitamin D (Ergocalciferol) 1.25 MG (50000 UNIT) Caps capsule Commonly known as: DRISDOL Take 50,000 Units by mouth once a week.        Allergies:  Allergies  Allergen Reactions   Nitroglycerin Nausea And Vomiting   Ampicillin Other (See Comments)    Per allergy test    Past Medical History, Surgical history, Social history, and Family History were reviewed and updated.  Review of Systems: Review of Systems  HENT: Negative.    Eyes: Negative.   Respiratory:  Positive for shortness of breath.   Cardiovascular: Negative.   Gastrointestinal:  Positive for abdominal pain.  Genitourinary: Negative.   Musculoskeletal:  Positive for back pain.  Skin: Negative.   Neurological: Negative.   Endo/Heme/Allergies: Negative.   Psychiatric/Behavioral: Negative.      Physical Exam:  vitals were not taken for this visit.   Wt Readings from Last 3 Encounters:  09/20/21 173 lb 0.6 oz (78.5 kg)  09/02/21 171 lb 1.3 oz (77.6 kg)  08/21/21 173 lb (78.5 kg)    Physical Exam Vitals reviewed.  HENT:     Head: Normocephalic and atraumatic.  Eyes:     Pupils: Pupils are equal, round, and reactive to light.  Cardiovascular:     Rate and Rhythm: Normal rate and regular rhythm.     Heart sounds: Normal heart sounds.  Pulmonary:     Effort: Pulmonary effort is normal.     Breath sounds: Normal breath sounds.  Abdominal:     General: Bowel sounds are normal.     Palpations: Abdomen is soft.     Comments: Her abdomen is slightly distended.  There is no obvious fluid wave.  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  She still has a dressing over  the abdominal laparotomy scar.  She does not have any obvious erythema or warmth.  There is no exudate coming from the scars.  She has decent bowel sounds.  There is no palpable liver or spleen tip.  Musculoskeletal:        General: No tenderness or deformity. Normal range of motion.     Cervical back: Normal range of motion.  Lymphadenopathy:     Cervical:  No cervical adenopathy.  Skin:    General: Skin is warm and dry.     Findings: No erythema or rash.  Neurological:     Mental Status: She is alert and oriented to person, place, and time.  Psychiatric:        Behavior: Behavior normal.        Thought Content: Thought content normal.        Judgment: Judgment normal.     Lab Results  Component Value Date   WBC 4.6 09/20/2021   HGB 10.3 (L) 09/20/2021   HCT 32.2 (L) 09/20/2021   MCV 93.3 09/20/2021   PLT 280 09/20/2021   Lab Results  Component Value Date   FERRITIN 377 (H) 09/02/2021   IRON 64 09/02/2021   TIBC 291 09/02/2021   UIBC 227 09/02/2021   IRONPCTSAT 22 09/02/2021   Lab Results  Component Value Date   RETICCTPCT 1.9 01/05/2020   RBC 3.45 (L) 09/20/2021   No results found for: KPAFRELGTCHN, LAMBDASER, KAPLAMBRATIO No results found for: IGGSERUM, IGA, IGMSERUM No results found for: Odetta Pink, SPEI   Chemistry      Component Value Date/Time   NA 137 09/02/2021 0822   NA 139 07/12/2019 1110   K 3.5 09/02/2021 0822   CL 103 09/02/2021 0822   CO2 22 09/02/2021 0822   BUN 14 09/02/2021 0822   BUN 15 07/12/2019 1110   CREATININE 0.75 09/02/2021 0822   CREATININE 0.74 01/08/2017 1100      Component Value Date/Time   CALCIUM 9.4 09/02/2021 0822   ALKPHOS 47 09/02/2021 0822   AST 19 09/02/2021 0822   ALT 23 09/02/2021 0822   BILITOT 0.4 09/02/2021 0037       Impression and Plan: Ms. 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   She has recurrent disease.  We are treating with FOLFIRI.  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  I am a little worried about having to use Avastin because of this abdominal wound.  I do not want to see this become a problem then opened up.  We will have to treat her next week.  That will be her fourth cycle of treatment.  We will then plan for a follow-up scan and see how everything looks.  I just want her quality of life to be good.  I am glad that she is got have a nice New Year's.    Volanda Napoleon, MD 12/30/20228:57 AM

## 2021-09-25 ENCOUNTER — Inpatient Hospital Stay: Payer: Medicaid Other | Attending: Gynecologic Oncology

## 2021-09-25 ENCOUNTER — Other Ambulatory Visit: Payer: Self-pay

## 2021-09-25 VITALS — BP 123/75 | HR 89 | Temp 98.0°F | Resp 17

## 2021-09-25 DIAGNOSIS — Z5111 Encounter for antineoplastic chemotherapy: Secondary | ICD-10-CM | POA: Insufficient documentation

## 2021-09-25 DIAGNOSIS — D510 Vitamin B12 deficiency anemia due to intrinsic factor deficiency: Secondary | ICD-10-CM | POA: Insufficient documentation

## 2021-09-25 DIAGNOSIS — C187 Malignant neoplasm of sigmoid colon: Secondary | ICD-10-CM | POA: Diagnosis present

## 2021-09-25 MED ORDER — PALONOSETRON HCL INJECTION 0.25 MG/5ML
0.2500 mg | Freq: Once | INTRAVENOUS | Status: AC
  Administered 2021-09-25: 0.25 mg via INTRAVENOUS
  Filled 2021-09-25: qty 5

## 2021-09-25 MED ORDER — SODIUM CHLORIDE 0.9 % IV SOLN: Freq: Once | INTRAVENOUS | Status: AC

## 2021-09-25 MED ORDER — SODIUM CHLORIDE 0.9 % IV SOLN
10.0000 mg | Freq: Once | INTRAVENOUS | Status: AC
  Administered 2021-09-25: 10 mg via INTRAVENOUS
  Filled 2021-09-25: qty 10

## 2021-09-25 MED ORDER — SODIUM CHLORIDE 0.9 % IV SOLN
144.0000 mg/m2 | Freq: Once | INTRAVENOUS | Status: AC
  Administered 2021-09-25: 260 mg via INTRAVENOUS
  Filled 2021-09-25: qty 5

## 2021-09-25 MED ORDER — ATROPINE SULFATE 1 MG/ML IV SOLN
0.5000 mg | Freq: Once | INTRAVENOUS | Status: AC | PRN
  Administered 2021-09-25: 0.5 mg via INTRAVENOUS
  Filled 2021-09-25: qty 1

## 2021-09-25 MED ORDER — SODIUM CHLORIDE 0.9 % IV SOLN
1920.0000 mg/m2 | INTRAVENOUS | Status: DC
  Administered 2021-09-25: 3600 mg via INTRAVENOUS
  Filled 2021-09-25: qty 72

## 2021-09-25 MED ORDER — SODIUM CHLORIDE 0.9 % IV SOLN
400.0000 mg/m2 | Freq: Once | INTRAVENOUS | Status: AC
  Administered 2021-09-25: 748 mg via INTRAVENOUS
  Filled 2021-09-25: qty 37.4

## 2021-09-25 MED ORDER — FLUOROURACIL CHEMO INJECTION 2.5 GM/50ML
400.0000 mg/m2 | Freq: Once | INTRAVENOUS | Status: AC
  Administered 2021-09-25: 750 mg via INTRAVENOUS
  Filled 2021-09-25: qty 15

## 2021-09-25 NOTE — Patient Instructions (Signed)
[facility]  Discharge Instructions: Thank you for choosing [facility] to provide your oncology and hematology care.   If you have a lab appointment with the [facility], please go directly to the [facility] and check in at the registration area.  Wear comfortable clothing and clothing appropriate for easy access to any Portacath or PICC line.   We strive to give you quality time with your provider. You may need to reschedule your appointment if you arrive late (15 or more minutes).  Arriving late affects you and other patients whose appointments are after yours.  Also, if you miss three or more appointments without notifying the office, you may be dismissed from the clinic at the providers discretion.      For prescription refill requests, have your pharmacy contact our office and allow 72 hours for refills to be completed.    Today you received the following chemotherapy and/or immunotherapy agents 5-fu, leucovorin, and irinotecan   To help prevent nausea and vomiting after your treatment, we encourage you to take your nausea medication as directed.  BELOW ARE SYMPTOMS THAT SHOULD BE REPORTED IMMEDIATELY: *FEVER GREATER THAN 100.4 F (38 C) OR HIGHER *CHILLS OR SWEATING *NAUSEA AND VOMITING THAT IS NOT CONTROLLED WITH YOUR NAUSEA MEDICATION *UNUSUAL SHORTNESS OF BREATH *UNUSUAL BRUISING OR BLEEDING *URINARY PROBLEMS (pain or burning when urinating, or frequent urination) *BOWEL PROBLEMS (unusual diarrhea, constipation, pain near the anus) TENDERNESS IN MOUTH AND THROAT WITH OR WITHOUT PRESENCE OF ULCERS (sore throat, sores in mouth, or a toothache) UNUSUAL RASH, SWELLING OR PAIN  UNUSUAL VAGINAL DISCHARGE OR ITCHING   Items with * indicate a potential emergency and should be followed up as soon as possible or go to the Emergency Department if any problems should occur.  Please show the CHEMOTHERAPY ALERT CARD or IMMUNOTHERAPY ALERT CARD  at check-in to the Emergency Department and triage nurse. Should you have questions after your visit or need to cancel or reschedule your appointment, please contact [facility]  [phone] and follow the prompts.  Office hours are 8:00 a.m. to 4:30 p.m. Monday - Friday. Please note that voicemails left after 4:00 p.m. may not be returned until the following business day.  We are closed weekends and major holidays. You have access to a nurse at all times for urgent questions. Please call the main number to the clinic [phone] and follow the prompts.  For any non-urgent questions, you may also contact your provider using MyChart. We now offer e-Visits for anyone 19 and older to request care online for non-urgent symptoms. For details visit [email or web address].   Also download the MyChart app! Go to the app store, search "MyChart", open the app, select [facility], and log in with your MyChart username and password.  Due to Covid, a mask is required upon entering the hospital/clinic. If you do not have a mask, one will be given to you upon arrival. For doctor visits, patients may have 1 support person aged 55 or older with them. For treatment visits, patients cannot have anyone with them due to current Covid guidelines and our immunocompromised population.

## 2021-09-27 ENCOUNTER — Other Ambulatory Visit: Payer: Self-pay

## 2021-09-27 ENCOUNTER — Inpatient Hospital Stay: Payer: Medicaid Other

## 2021-09-27 VITALS — BP 116/50 | HR 90 | Temp 98.0°F | Resp 17

## 2021-09-27 DIAGNOSIS — Z5111 Encounter for antineoplastic chemotherapy: Secondary | ICD-10-CM | POA: Diagnosis not present

## 2021-09-27 DIAGNOSIS — C187 Malignant neoplasm of sigmoid colon: Secondary | ICD-10-CM

## 2021-09-27 DIAGNOSIS — C772 Secondary and unspecified malignant neoplasm of intra-abdominal lymph nodes: Secondary | ICD-10-CM

## 2021-09-27 MED ORDER — HEPARIN SOD (PORK) LOCK FLUSH 100 UNIT/ML IV SOLN
500.0000 [IU] | Freq: Once | INTRAVENOUS | Status: AC | PRN
  Administered 2021-09-27: 500 [IU]

## 2021-09-27 MED ORDER — SODIUM CHLORIDE 0.9% FLUSH
10.0000 mL | INTRAVENOUS | Status: DC | PRN
  Administered 2021-09-27: 10 mL

## 2021-09-27 NOTE — Patient Instructions (Signed)

## 2021-10-10 ENCOUNTER — Encounter (HOSPITAL_BASED_OUTPATIENT_CLINIC_OR_DEPARTMENT_OTHER): Payer: Self-pay

## 2021-10-10 ENCOUNTER — Ambulatory Visit (HOSPITAL_BASED_OUTPATIENT_CLINIC_OR_DEPARTMENT_OTHER)
Admission: RE | Admit: 2021-10-10 | Discharge: 2021-10-10 | Disposition: A | Discharge status: Home / Self Care | Payer: Medicaid Other | Source: Ambulatory Visit | Attending: Hematology & Oncology | Admitting: Hematology & Oncology

## 2021-10-10 ENCOUNTER — Other Ambulatory Visit: Payer: Self-pay

## 2021-10-10 DIAGNOSIS — C772 Secondary and unspecified malignant neoplasm of intra-abdominal lymph nodes: Secondary | ICD-10-CM | POA: Diagnosis present

## 2021-10-10 DIAGNOSIS — C187 Malignant neoplasm of sigmoid colon: Secondary | ICD-10-CM | POA: Insufficient documentation

## 2021-10-10 MED ORDER — IOHEXOL 300 MG/ML  SOLN
100.0000 mL | Freq: Once | INTRAMUSCULAR | Status: AC | PRN
  Administered 2021-10-10: 100 mL via INTRAVENOUS

## 2021-10-11 ENCOUNTER — Other Ambulatory Visit: Payer: Self-pay | Admitting: Hematology & Oncology

## 2021-10-11 ENCOUNTER — Telehealth: Payer: Self-pay

## 2021-10-11 MED FILL — Fluorouracil IV Soln 5 GM/100ML (50 MG/ML): INTRAVENOUS | Qty: 72 | Status: AC

## 2021-10-11 MED FILL — Fluorouracil IV Soln 2.5 GM/50ML (50 MG/ML): INTRAVENOUS | Qty: 15 | Status: AC

## 2021-10-11 NOTE — Telephone Encounter (Signed)
Pt niece [name]) notified of pt CT results per order of Dr. Marin Olp. Mary aware that the CT scan showed that the CT scan looks a little better and showed noothing is growing. Pt niece verbalized understanding and appreciative of call. Next appointment reviewed with [name] who verbalized understanding and had no further questions.

## 2021-10-14 ENCOUNTER — Other Ambulatory Visit: Payer: Self-pay

## 2021-10-14 ENCOUNTER — Inpatient Hospital Stay: Payer: Medicaid Other

## 2021-10-14 ENCOUNTER — Inpatient Hospital Stay: Payer: Medicaid Other | Admitting: Hematology & Oncology

## 2021-10-14 ENCOUNTER — Encounter: Payer: Self-pay | Admitting: Hematology & Oncology

## 2021-10-14 ENCOUNTER — Telehealth: Payer: Self-pay

## 2021-10-14 ENCOUNTER — Other Ambulatory Visit: Payer: Self-pay | Admitting: *Deleted

## 2021-10-14 VITALS — BP 120/65 | HR 75 | Temp 98.0°F | Resp 18 | Wt 173.0 lb

## 2021-10-14 DIAGNOSIS — C187 Malignant neoplasm of sigmoid colon: Secondary | ICD-10-CM

## 2021-10-14 DIAGNOSIS — C772 Secondary and unspecified malignant neoplasm of intra-abdominal lymph nodes: Secondary | ICD-10-CM | POA: Diagnosis not present

## 2021-10-14 DIAGNOSIS — Z5111 Encounter for antineoplastic chemotherapy: Secondary | ICD-10-CM | POA: Diagnosis not present

## 2021-10-14 LAB — CMP (CANCER CENTER ONLY)
ALT: 22 U/L (ref 0–44)
AST: 19 U/L (ref 15–41)
Albumin: 4.2 g/dL (ref 3.5–5.0)
Alkaline Phosphatase: 47 U/L (ref 38–126)
Anion gap: 10 (ref 5–15)
BUN: 13 mg/dL (ref 8–23)
CO2: 26 mmol/L (ref 22–32)
Calcium: 9.9 mg/dL (ref 8.9–10.3)
Chloride: 103 mmol/L (ref 98–111)
Creatinine: 0.69 mg/dL (ref 0.44–1.00)
GFR, Estimated: >60 mL/min (ref >60)
Glucose, Bld: 155 mg/dL — ABNORMAL HIGH (ref 70–99)
Potassium: 3.9 mmol/L (ref 3.5–5.1)
Sodium: 139 mmol/L (ref 135–145)
Total Bilirubin: 0.4 mg/dL (ref 0.3–1.2)
Total Protein: 6.9 g/dL (ref 6.5–8.1)

## 2021-10-14 LAB — CBC WITH DIFFERENTIAL (CANCER CENTER ONLY)
Abs Immature Granulocytes: 0.02 10*3/uL (ref 0.00–0.07)
Basophils Absolute: 0 10*3/uL (ref 0.0–0.1)
Basophils Relative: 1 %
Eosinophils Absolute: 1.1 10*3/uL — ABNORMAL HIGH (ref 0.0–0.5)
Eosinophils Relative: 20 %
HCT: 34.9 % — ABNORMAL LOW (ref 36.0–46.0)
Hemoglobin: 11.1 g/dL — ABNORMAL LOW (ref 12.0–15.0)
Immature Granulocytes: 0 %
Lymphocytes Relative: 38 %
Lymphs Abs: 2.1 10*3/uL (ref 0.7–4.0)
MCH: 30.6 pg (ref 26.0–34.0)
MCHC: 31.8 g/dL (ref 30.0–36.0)
MCV: 96.1 fL (ref 80.0–100.0)
Monocytes Absolute: 0.7 10*3/uL (ref 0.1–1.0)
Monocytes Relative: 13 %
Neutro Abs: 1.5 10*3/uL — ABNORMAL LOW (ref 1.7–7.7)
Neutrophils Relative %: 28 %
Platelet Count: 282 10*3/uL (ref 150–400)
RBC: 3.63 MIL/uL — ABNORMAL LOW (ref 3.87–5.11)
RDW: 16.7 % — ABNORMAL HIGH (ref 11.5–15.5)
WBC Count: 5.4 10*3/uL (ref 4.0–10.5)
nRBC: 0 % (ref 0.0–0.2)

## 2021-10-14 LAB — CEA (IN HOUSE-CHCC): CEA (CHCC-In House): 7.75 ng/mL — ABNORMAL HIGH (ref 0.00–5.00)

## 2021-10-14 LAB — IRON AND IRON BINDING CAPACITY (CC-WL,HP ONLY)
Iron: 51 ug/dL (ref 28–170)
Saturation Ratios: 15 % (ref 10.4–31.8)
TIBC: 349 ug/dL (ref 250–450)
UIBC: 298 ug/dL (ref 148–442)

## 2021-10-14 LAB — FERRITIN: Ferritin: 208 ng/mL (ref 11–307)

## 2021-10-14 LAB — LACTATE DEHYDROGENASE: LDH: 139 U/L (ref 98–192)

## 2021-10-14 MED ORDER — SODIUM CHLORIDE 0.9 % IV SOLN
10.0000 mg | Freq: Once | INTRAVENOUS | Status: AC
  Administered 2021-10-14: 10 mg via INTRAVENOUS
  Filled 2021-10-14: qty 10

## 2021-10-14 MED ORDER — FLUOROURACIL CHEMO INJECTION 2.5 GM/50ML
400.0000 mg/m2 | Freq: Once | INTRAVENOUS | Status: AC
  Administered 2021-10-14: 750 mg via INTRAVENOUS
  Filled 2021-10-14: qty 15

## 2021-10-14 MED ORDER — TRAMADOL HCL 50 MG PO TABS: 50.0000 mg | ORAL_TABLET | Freq: Four times a day (QID) | ORAL | 3 refills | Status: AC | PRN

## 2021-10-14 MED ORDER — SODIUM CHLORIDE 0.9 % IV SOLN: Freq: Once | INTRAVENOUS | Status: AC

## 2021-10-14 MED ORDER — PALONOSETRON HCL INJECTION 0.25 MG/5ML
0.2500 mg | Freq: Once | INTRAVENOUS | Status: AC
  Administered 2021-10-14: 0.25 mg via INTRAVENOUS
  Filled 2021-10-14: qty 5

## 2021-10-14 MED ORDER — SODIUM CHLORIDE 0.9 % IV SOLN
144.0000 mg/m2 | Freq: Once | INTRAVENOUS | Status: AC
  Administered 2021-10-14: 260 mg via INTRAVENOUS
  Filled 2021-10-14: qty 5

## 2021-10-14 MED ORDER — SILVER SULFADIAZINE 1 % EX CREA: 1.0000 "application " | TOPICAL_CREAM | Freq: Every day | CUTANEOUS | 2 refills | Status: AC

## 2021-10-14 MED ORDER — TRAMADOL HCL 50 MG PO TABS: 50.0000 mg | ORAL_TABLET | Freq: Four times a day (QID) | ORAL | 3 refills | Status: DC | PRN

## 2021-10-14 MED ORDER — SODIUM CHLORIDE 0.9 % IV SOLN
1920.0000 mg/m2 | INTRAVENOUS | Status: DC
  Administered 2021-10-14: 3600 mg via INTRAVENOUS
  Filled 2021-10-14: qty 72

## 2021-10-14 MED ORDER — ATROPINE SULFATE 1 MG/ML IV SOLN
0.5000 mg | Freq: Once | INTRAVENOUS | Status: AC | PRN
  Administered 2021-10-14: 0.5 mg via INTRAVENOUS
  Filled 2021-10-14: qty 1

## 2021-10-14 MED ORDER — SODIUM CHLORIDE 0.9 % IV SOLN
400.0000 mg/m2 | Freq: Once | INTRAVENOUS | Status: AC
  Administered 2021-10-14: 748 mg via INTRAVENOUS
  Filled 2021-10-14: qty 37.4

## 2021-10-14 NOTE — Telephone Encounter (Signed)
-----   Message from Volanda Napoleon, MD sent at 10/14/2021 12:43 PM EST ----- Call - the iron is low.  Please give her a dose of IV iron!!  Laurey Arrow

## 2021-10-14 NOTE — Telephone Encounter (Signed)
Called and informed [name] (ok per DPR) of lab results, she verbalized understanding and denies any questions or concerns at this time. Message sent to scheduling to set up IV Iron for Feraheme.

## 2021-10-14 NOTE — Patient Instructions (Signed)

## 2021-10-14 NOTE — Progress Notes (Signed)
Hematology and Oncology Follow Up Visit  Bailey Walker [id number] 08/07/1946 76 y.o. 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  Overall, Bailey Walker is going pretty well.  She has little bit of excoriation at the laparotomy site.  This is taking quite a while to heal up.  I will give her some Silvadene cream to apply to see if this may help..  She had a CT scan done on January 19.  This showed that she was responding.  She had decrease in a peritoneal nodule.  Her left adnexal mass was about the same size.  She has stable pulmonary nodules.  Her last CEA was down to 10.9.  She has a colostomy.  This is working fairly well.  She is having no problems with cough or shortness of breath.  She is having no issues with nausea or vomiting.  She is eating okay.  She does not have any leg swelling..  She is having a lot of back pain.  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  Overall, her performance status is ECOG 1.       Medications:  Allergies as of 10/14/2021       Reactions   Nitroglycerin Nausea And Vomiting   Ampicillin Other (See Comments)   Per allergy test        Medication List        Accurate as of October 14, 2021  9:54 AM. If you have any questions, ask your nurse or doctor.          acetaminophen 500 MG tablet Commonly known as: TYLENOL Take 500 mg by mouth in the morning and at bedtime.   alendronate 70 MG tablet Commonly known as: FOSAMAX Take 70 mg by mouth every Sunday.   aspirin EC 81 MG tablet Take 81 mg by mouth daily. Swallow whole.   atorvastatin 10 MG tablet Commonly known as: LIPITOR Take 2  tablets (20 mg total) by mouth daily.   CVS Gentle Laxative 5 MG EC tablet Generic drug: bisacodyl Take 5 mg by mouth daily as needed.   CVS Purelax 17 GM/SCOOP powder Generic drug: polyethylene glycol powder Take 17 g by mouth daily as needed.   dexamethasone 4 MG tablet Commonly known as: DECADRON Take 2 tablets (8 mg total) by mouth daily. Start the day after chemo for 2 days.   feeding supplement Liqd Take 237 mLs by mouth 3 (three) times daily between meals.   glipiZIDE 5 MG tablet Commonly known as: GLUCOTROL TAKE 1 TABLET BY MOUTH TWICE A DAY BEFORE MEALS   levothyroxine 75 MCG tablet Commonly known as: SYNTHROID TAKE 1 TABLET BY MOUTH EVERY DAY What changed: when to take this   loperamide 2 MG capsule Commonly known as: IMODIUM Take 2 capsules (4 mg total) by mouth as needed. Take 2 at diarrhea onset , then 1 every 2hr until 12hrs with no BM. May take 2 every 4hrs at night. If diarrhea recurs repeat.   metFORMIN 500 MG tablet Commonly known as: GLUCOPHAGE Take 0.5 tablets (250 mg total) by mouth 2 (two) times daily with  a meal. What changed: how much to take   methocarbamol 500 MG tablet Commonly known as: ROBAXIN TAKE 1 TABLET BY MOUTH EVERY 12 HOURS AS NEEDED FOR MUSCLE SPASMS.   metoprolol tartrate 25 MG tablet Commonly known as: LOPRESSOR Take 1 tablet (25 mg total) by mouth 2 (two) times daily.   morphine 15 MG 12 hr tablet Commonly known as: MS CONTIN Take 1 tablet (15 mg total) by mouth every 12 (twelve) hours.   morphine 15 MG tablet Commonly known as: MSIR Take 1 tablet (15 mg total) by mouth every 4 (four) hours as needed for severe pain.   multivitamin with minerals Tabs tablet Take 1 tablet by mouth daily.   ondansetron 8 MG tablet Commonly known as: ZOFRAN Take 1 tablet (8 mg total) by mouth 2 (two) times daily as needed for refractory nausea / vomiting. Start on day 3 after chemotherapy.   OVER THE COUNTER MEDICATION Take 5 mLs by mouth  at bedtime. Mucosolve - used for cough (pt got from [place])   pantoprazole 20 MG tablet Commonly known as: Protonix Take 1 tablet (20 mg total) by mouth daily.   prednisoLONE acetate 1 % ophthalmic suspension Commonly known as: PRED FORTE Place 1 drop into both eyes every Monday.   prochlorperazine 10 MG tablet Commonly known as: COMPAZINE Take 1 tablet (10 mg total) by mouth every 6 (six) hours as needed (NAUSEA).   silver sulfADIAZINE 1 % cream Commonly known as: Silvadene Apply 1 application topically daily. Started by: Volanda Napoleon, MD   Vitamin D (Ergocalciferol) 1.25 MG (50000 UNIT) Caps capsule Commonly known as: DRISDOL Take 50,000 Units by mouth once a week.        Allergies:  Allergies  Allergen Reactions   Nitroglycerin Nausea And Vomiting   Ampicillin Other (See Comments)    Per allergy test    Past Medical History, Surgical history, Social history, and Family History were reviewed and updated.  Review of Systems: Review of Systems  HENT: Negative.    Eyes: Negative.   Respiratory:  Positive for shortness of breath.   Cardiovascular: Negative.   Gastrointestinal:  Positive for abdominal pain.  Genitourinary: Negative.   Musculoskeletal:  Positive for back pain.  Skin: Negative.   Neurological: Negative.   Endo/Heme/Allergies: Negative.   Psychiatric/Behavioral: Negative.      Physical Exam:  weight is 173 lb (78.5 kg). Her oral temperature is 98 F (36.7 C). Her blood pressure is 120/65 and her pulse is 75. Her respiration is 18 and oxygen saturation is 100%.   Wt Readings from Last 3 Encounters:  10/14/21 173 lb (78.5 kg)  09/20/21 173 lb 0.6 oz (78.5 kg)  09/02/21 171 lb 1.3 oz (77.6 kg)    Physical Exam Vitals reviewed.  HENT:     Head: Normocephalic and atraumatic.  Eyes:     Pupils: Pupils are equal, round, and reactive to light.  Cardiovascular:     Rate and Rhythm: Normal rate and regular rhythm.     Heart sounds: Normal  heart sounds.  Pulmonary:     Effort: Pulmonary effort is normal.     Breath sounds: Normal breath sounds.  Abdominal:     General: Bowel sounds are normal.     Palpations: Abdomen is soft.     Comments: Her abdomen is slightly distended.  There is no obvious fluid wave.  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  She still has a dressing over the abdominal laparotomy scar.  She does not have any obvious erythema or warmth.  There is no exudate coming from the scars.  She has decent bowel sounds.  There is no palpable liver or spleen tip.  Musculoskeletal:        General: No tenderness or deformity. Normal range of motion.     Cervical back: Normal range of motion.  Lymphadenopathy:     Cervical: No cervical adenopathy.  Skin:    General: Skin is warm and dry.     Findings: No erythema or rash.  Neurological:     Mental Status: She is alert and oriented to person, place, and time.  Psychiatric:        Behavior: Behavior normal.        Thought Content: Thought content normal.        Judgment: Judgment normal.     Lab Results  Component Value Date   WBC 5.4 10/14/2021   HGB 11.1 (L) 10/14/2021   HCT 34.9 (L) 10/14/2021   MCV 96.1 10/14/2021   PLT 282 10/14/2021   Lab Results  Component Value Date   FERRITIN 306 09/20/2021   IRON 61 09/20/2021   TIBC 329 09/20/2021   UIBC 268 09/20/2021   IRONPCTSAT 19 09/20/2021   Lab Results  Component Value Date   RETICCTPCT 1.9 01/05/2020   RBC 3.63 (L) 10/14/2021   No results found for: KPAFRELGTCHN, LAMBDASER, KAPLAMBRATIO No results found for: IGGSERUM, IGA, IGMSERUM No results found for: Odetta Pink, SPEI   Chemistry      Component Value Date/Time   NA 139 10/14/2021 0855   NA 139 07/12/2019 1110   K 3.9 10/14/2021 0855   CL 103 10/14/2021 0855   CO2 26 10/14/2021 0855   BUN 13 10/14/2021 0855   BUN 15 07/12/2019 1110   CREATININE 0.69 10/14/2021 0855    CREATININE 0.74 01/08/2017 1100      Component Value Date/Time   CALCIUM 9.9 10/14/2021 0855   ALKPHOS 47 10/14/2021 0855   AST 19 10/14/2021 0855   ALT 22 10/14/2021 0855   BILITOT 0.4 10/14/2021 0855       Impression and Plan: Bailey Walker is a very pleasant 76 yo Kurdish female with stage IIIB ([id number]) 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   She has recurrent disease.  We are treating with FOLFIRI.  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  I am a little worried about having to use Avastin because of this abdominal wound.  We clearly are in a situation where Avastin really cannot be used because of this poorly healing abdominal wound.  At least, she is responding.  We will go ahead and give her cycle #5 today.  We will do a total of 8 cycles of FOLFIRI and then repeat another scan.    Volanda Napoleon, MD 1/23/20239:54 AM

## 2021-10-16 ENCOUNTER — Other Ambulatory Visit: Payer: Self-pay

## 2021-10-16 ENCOUNTER — Inpatient Hospital Stay: Payer: Medicaid Other

## 2021-10-16 ENCOUNTER — Other Ambulatory Visit: Payer: Self-pay | Admitting: General Practice

## 2021-10-16 ENCOUNTER — Other Ambulatory Visit: Payer: Self-pay | Admitting: Hematology & Oncology

## 2021-10-16 VITALS — BP 165/55 | HR 71 | Temp 98.0°F | Resp 18

## 2021-10-16 DIAGNOSIS — Z5111 Encounter for antineoplastic chemotherapy: Secondary | ICD-10-CM | POA: Diagnosis not present

## 2021-10-16 DIAGNOSIS — C187 Malignant neoplasm of sigmoid colon: Secondary | ICD-10-CM

## 2021-10-16 DIAGNOSIS — Z95828 Presence of other vascular implants and grafts: Secondary | ICD-10-CM

## 2021-10-16 IMAGING — CT CT BIOPSY
1 of 3 series · 15 of 32 positions shown, 19 images · non-contrast
Comparison: none

INDICATION: 75-year-old female referred for biopsy FDG avid nodule within the
right abdomen, concern for metastatic colon cancer

[Series 2: i-spiral 5.0 b40f · axial · 0.96mm/px · z∈[+670,+1038]mm · 15 of 115 slices shown, 19 images]
[im 5/115  soft-tissue]
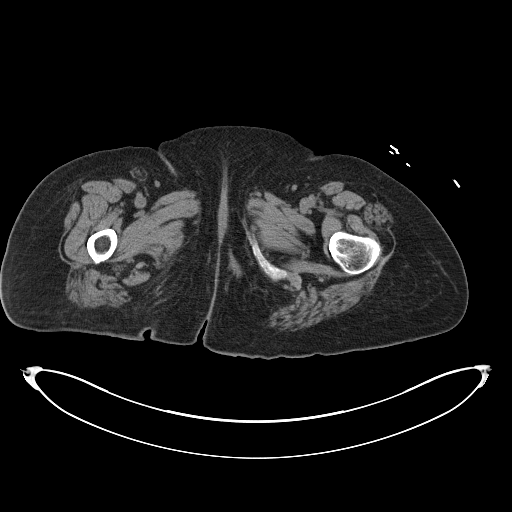
[im 5/115  bone]
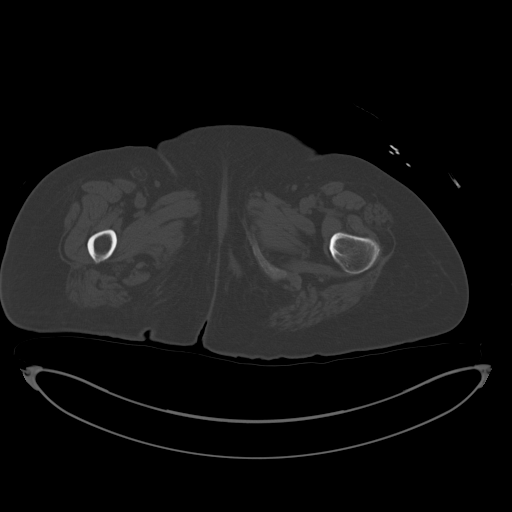
[im 15/115  soft-tissue]
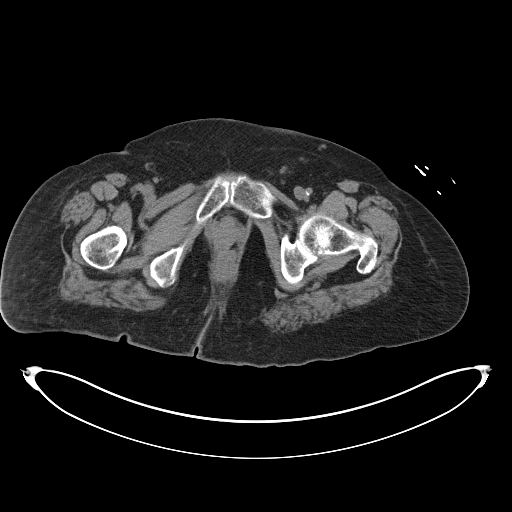
[im 25/115  soft-tissue]
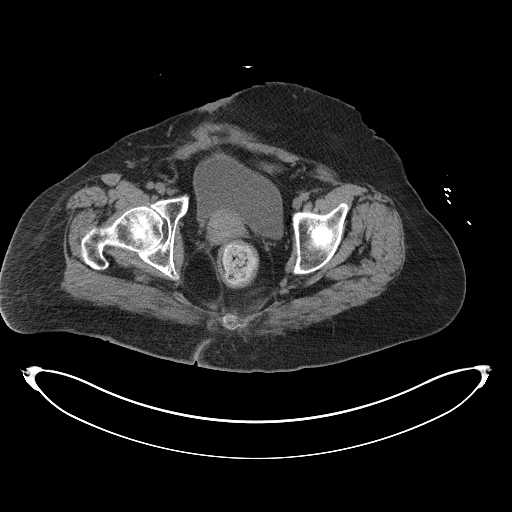
[im 30/115  soft-tissue]
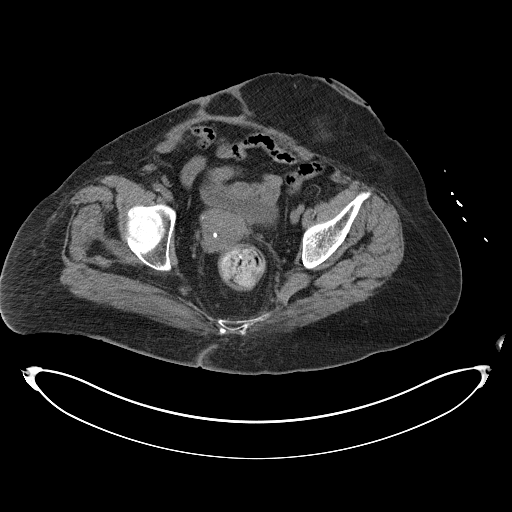
[im 40/115  soft-tissue]
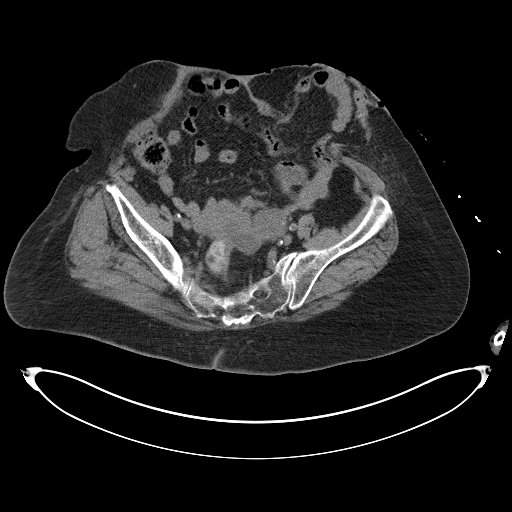
[im 50/115  soft-tissue]
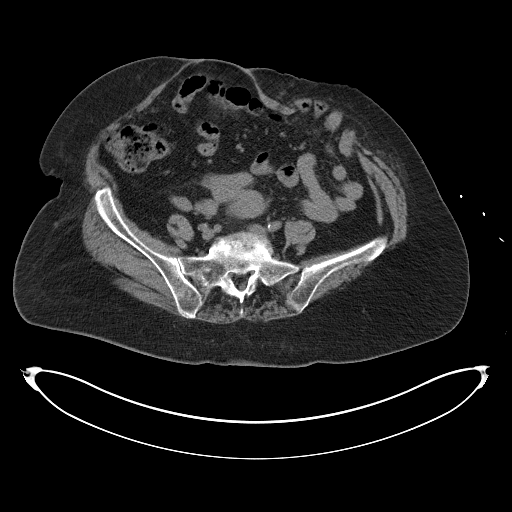
[im 60/115  soft-tissue]
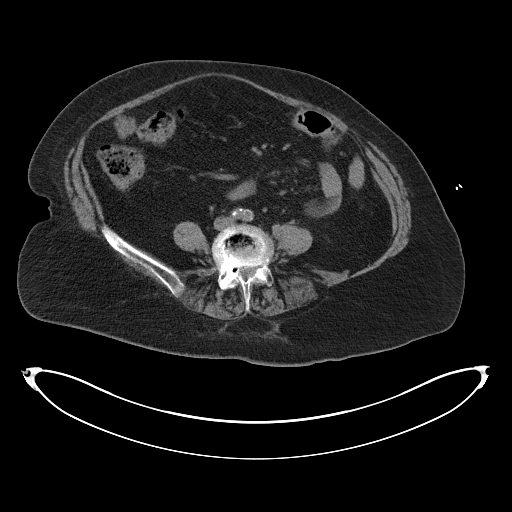
[im 65/115  soft-tissue]
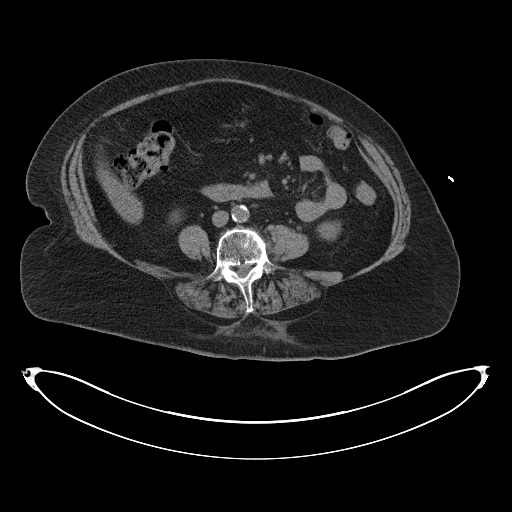
[im 75/115  soft-tissue]
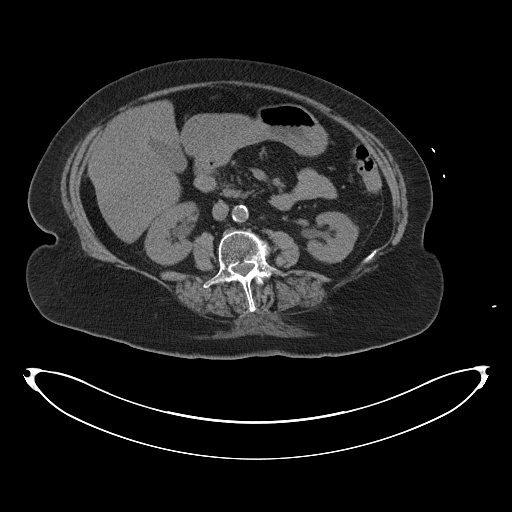
[im 75/115  bone]
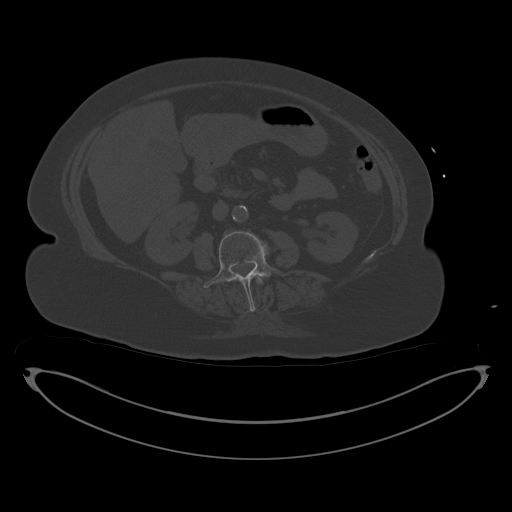
[im 85/115  soft-tissue]
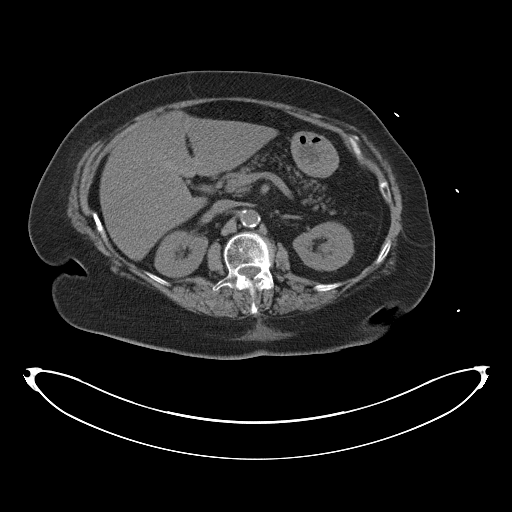
[im 90/115  soft-tissue]
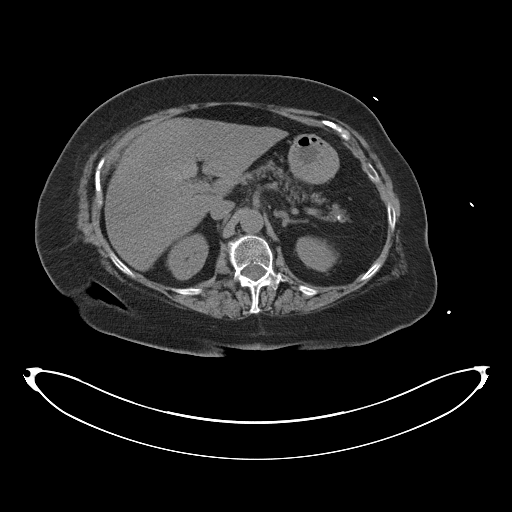
[im 95/115  lung]
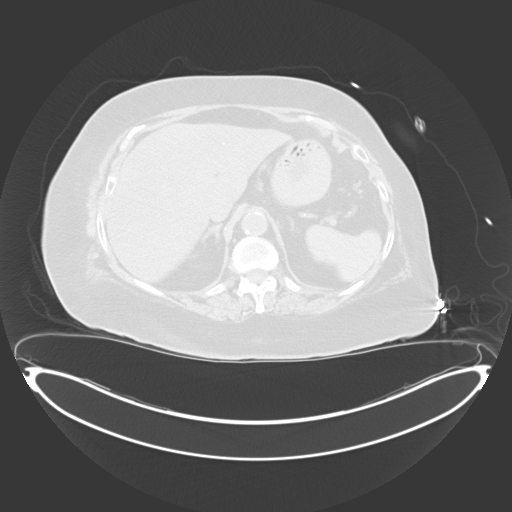
[im 100/115  soft-tissue]
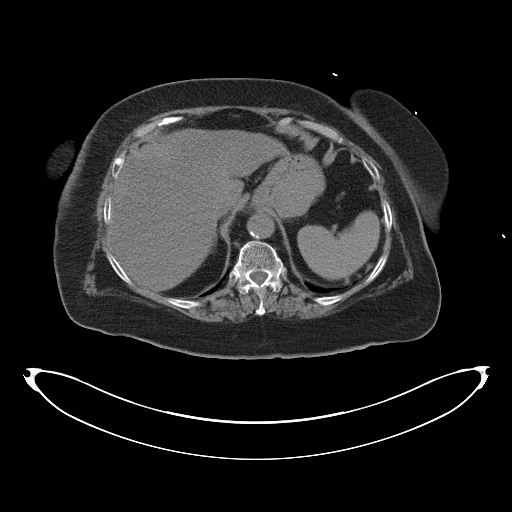
[im 100/115  lung]
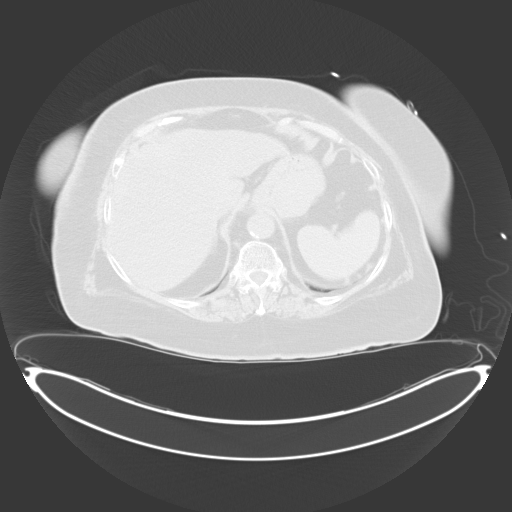
[im 105/115  lung]
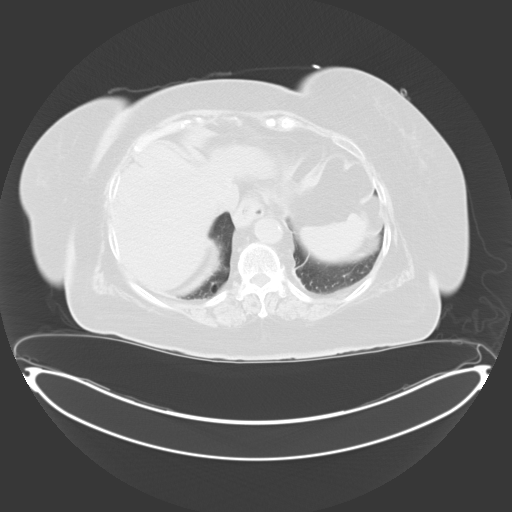
[im 110/115  soft-tissue]
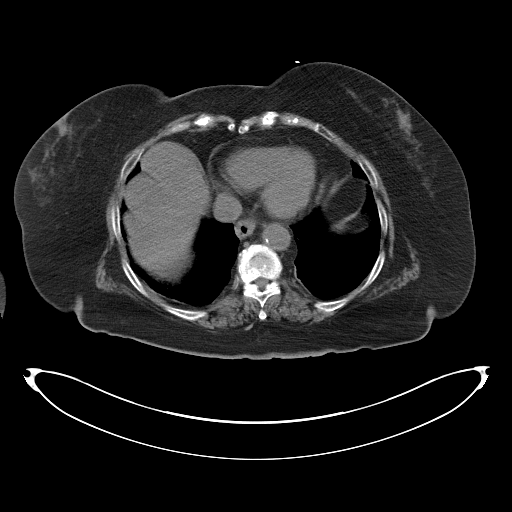
[im 110/115  lung]
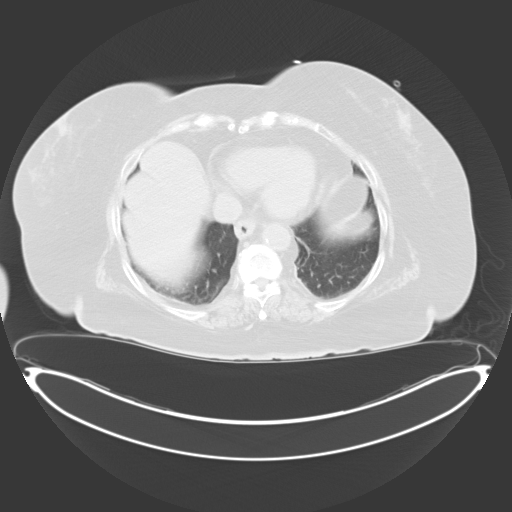

[15 of 32 positions shown; findings below may reference images not displayed]

EXAM:
CT BIOPSY

MEDICATIONS:
None.

ANESTHESIA/SEDATION:
Moderate (conscious) sedation was employed during this procedure. A
total of Versed 1.5 mg and Fentanyl 75 mcg was administered
intravenously.

Moderate Sedation Time: 14 minutes. The patient's level of
consciousness and vital signs were monitored continuously by
radiology nursing throughout the procedure under my direct
supervision.

FLUOROSCOPY TIME:  CT

COMPLICATIONS:
None

PROCEDURE:
Informed written consent was obtained from the patient after a
thorough discussion of the procedural risks, benefits and
alternatives. All questions were addressed. Maximal Sterile Barrier
Technique was utilized including caps, mask, sterile gowns, sterile
gloves, sterile drape, hand hygiene and skin antiseptic. A timeout
was performed prior to the initiation of the procedure.

Patient was positioned supine position on the CT gantry table. Scout
CT was acquired for planning purposes. The patient is prepped and
draped in the usual sterile fashion. 1% lidocaine was used for local
anesthesia. Using CT guidance, guide needle was advanced to the
nodule within the right abdomen, after generous infiltration with 1%
lidocaine. Once we confirmed needle tip position, multiple 18 gauge
core biopsy were acquired. Needle was removed and a final CT was
performed.

Patient tolerated the procedure well and remained hemodynamically
stable throughout.

No complications were encountered and no significant blood loss.
IMPRESSION: Status post CT-guided biopsy of peritoneal nodule of the right
abdomen.

## 2021-10-16 MED ORDER — HEPARIN SOD (PORK) LOCK FLUSH 100 UNIT/ML IV SOLN
500.0000 [IU] | Freq: Once | INTRAVENOUS | Status: AC
  Administered 2021-10-16: 12:00:00 500 [IU] via INTRAVENOUS

## 2021-10-16 MED ORDER — SODIUM CHLORIDE 0.9% FLUSH
10.0000 mL | Freq: Once | INTRAVENOUS | Status: AC
  Administered 2021-10-16: 12:00:00 10 mL via INTRAVENOUS

## 2021-10-16 NOTE — Patient Instructions (Signed)

## 2021-10-17 ENCOUNTER — Encounter: Payer: Self-pay | Admitting: Hematology & Oncology

## 2021-10-17 ENCOUNTER — Inpatient Hospital Stay: Payer: Medicaid Other

## 2021-10-17 ENCOUNTER — Encounter: Payer: Self-pay | Admitting: Hematology and Oncology

## 2021-10-17 VITALS — BP 109/51 | HR 77 | Temp 97.8°F | Resp 18

## 2021-10-17 DIAGNOSIS — Z5111 Encounter for antineoplastic chemotherapy: Secondary | ICD-10-CM | POA: Diagnosis not present

## 2021-10-17 DIAGNOSIS — D509 Iron deficiency anemia, unspecified: Secondary | ICD-10-CM

## 2021-10-17 MED ORDER — SODIUM CHLORIDE 0.9 % IV SOLN: Freq: Once | INTRAVENOUS | Status: AC

## 2021-10-17 MED ORDER — CYANOCOBALAMIN 1000 MCG/ML IJ SOLN
1000.0000 ug | Freq: Once | INTRAMUSCULAR | Status: AC
  Administered 2021-10-17: 1000 ug via INTRAMUSCULAR
  Filled 2021-10-17: qty 1

## 2021-10-17 MED ORDER — SODIUM CHLORIDE 0.9% FLUSH
10.0000 mL | Freq: Once | INTRAVENOUS | Status: AC | PRN
  Administered 2021-10-17: 10 mL

## 2021-10-17 MED ORDER — SODIUM CHLORIDE 0.9 % IV SOLN
510.0000 mg | Freq: Once | INTRAVENOUS | Status: AC
  Administered 2021-10-17: 510 mg via INTRAVENOUS
  Filled 2021-10-17: qty 17

## 2021-10-17 MED ORDER — HEPARIN SOD (PORK) LOCK FLUSH 100 UNIT/ML IV SOLN
500.0000 [IU] | Freq: Once | INTRAVENOUS | Status: AC | PRN
  Administered 2021-10-17: 500 [IU]

## 2021-10-17 NOTE — Patient Instructions (Addendum)
Vitamin B12 Deficiency Vitamin B12 deficiency occurs when the body does not have enough vitamin B12, which is an important vitamin. The body needs this vitamin: To make red blood cells. To make DNA. This is the genetic material inside cells. To help the nerves work properly so they can carry messages from the brain to the body. Vitamin B12 deficiency can cause various health problems, such as a low red blood cell count (anemia) or nerve damage. What are the causes? This condition may be caused by: Not eating enough foods that contain vitamin B12. Not having enough stomach acid and digestive fluids to properly absorb vitamin B12 from the food that you eat. Certain digestive system diseases that make it hard to absorb vitamin B12. These diseases include Crohn's disease, chronic pancreatitis, and cystic fibrosis. A condition in which the body does not make enough of a protein (intrinsic factor), resulting in too few red blood cells (pernicious anemia). Having a surgery in which part of the stomach or small intestine is removed. Taking certain medicines that make it hard for the body to absorb vitamin B12. These medicines include: Heartburn medicines (antacids and proton pump inhibitors). Certain antibiotic medicines. Some medicines that are used to treat diabetes, tuberculosis, gout, or high cholesterol. What increases the risk? The following factors may make you more likely to develop a B12 deficiency: Being older than age 33. Eating a vegetarian or vegan diet, especially while you are pregnant. Eating a poor diet while you are pregnant. Taking certain medicines. Having alcoholism. What are the signs or symptoms? In some cases, there are no symptoms of this condition. If the condition leads to anemia or nerve damage, various symptoms can occur, such as: Weakness. Fatigue. Loss of appetite. Weight loss. Numbness or tingling in your hands and feet. Redness and burning of the  tongue. Confusion or memory problems. Depression. Sensory problems, such as color blindness, ringing in the ears, or loss of taste. Diarrhea or constipation. Trouble walking. If anemia is severe, symptoms can include: Shortness of breath. Dizziness. Rapid heart rate (tachycardia). How is this diagnosed? This condition may be diagnosed with a blood test to measure the level of vitamin B12 in your blood. You may also have other tests, including: A group of tests that measure certain characteristics of blood cells (complete blood count, CBC). A blood test to measure intrinsic factor. A procedure where a thin tube with a camera on the end is used to look into your stomach or intestines (endoscopy). Other tests may be needed to discover the cause of B12 deficiency. How is this treated? Treatment for this condition depends on the cause. This condition may be treated by: Changing your eating and drinking habits, such as: Eating more foods that contain vitamin B12. Drinking less alcohol or no alcohol. Getting vitamin B12 injections. Taking vitamin B12 supplements. Your health care provider will tell you which dosage is best for you. Follow these instructions at home: Eating and drinking  Eat lots of healthy foods that contain vitamin B12, including: Meats and poultry. This includes beef, pork, chicken, [place], and organ meats, such as liver. Seafood. This includes clams, rainbow trout, salmon, tuna, and haddock. Eggs. Cereal and dairy products that are fortified. This means that vitamin B12 has been added to the food. Check the label on the package to see if the food is fortified. The items listed above may not be a complete list of recommended foods and beverages. Contact a dietitian for more information. General instructions Get any  injections that are prescribed by your health care provider. Take supplements only as told by your health care provider. Follow the directions carefully. Do  not drink alcohol if your health care provider tells you not to. In some cases, you may only be asked to limit alcohol use. Keep all follow-up visits as told by your health care provider. This is important. Contact a health care provider if: Your symptoms come back. Get help right away if you: Develop shortness of breath. Have a rapid heart rate. Have chest pain. Become dizzy or lose consciousness. Summary Vitamin B12 deficiency occurs when the body does not have enough vitamin B12. The main causes of vitamin B12 deficiency include dietary deficiency, digestive diseases, pernicious anemia, and having a surgery in which part of the stomach or small intestine is removed. In some cases, there are no symptoms of this condition. If the condition leads to anemia or nerve damage, various symptoms can occur, such as weakness, shortness of breath, and numbness. Treatment may include getting vitamin B12 injections or taking vitamin B12 supplements. Eat lots of healthy foods that contain vitamin B12. This information is not intended to replace advice given to you by your health care provider. Make sure you discuss any questions you have with your health care provider. Document Revised: 03/06/2021 Document Reviewed: 05/18/2018 Elsevier Patient Education  2022 [place]. Ferumoxytol Injection What is this medication? FERUMOXYTOL (FER ue MOX i tol) treats low levels of iron in your body (iron deficiency anemia). Iron is a mineral that plays an important role in making red blood cells, which carry oxygen from your lungs to the rest of your body. This medicine may be used for other purposes; ask your health care provider or pharmacist if you have questions. COMMON BRAND NAME(S): Feraheme What should I tell my care team before I take this medication? They need to know if you have any of these conditions: Anemia not caused by low iron levels High levels of iron in the blood Magnetic resonance imaging  (MRI) test scheduled An unusual or allergic reaction to iron, other medications, foods, dyes, or preservatives Pregnant or trying to get pregnant Breast-feeding How should I use this medication? This medication is for injection into a vein. It is given in a hospital or clinic setting. Talk to your care team the use of this medication in children. Special care may be needed. Overdosage: If you think you have taken too much of this medicine contact a poison control center or emergency room at once. NOTE: This medicine is only for you. Do not share this medicine with others. What if I miss a dose? It is important not to miss your dose. Call your care team if you are unable to keep an appointment. What may interact with this medication? Other iron products This list may not describe all possible interactions. Give your health care provider a list of all the medicines, herbs, non-prescription drugs, or dietary supplements you use. Also tell them if you smoke, drink alcohol, or use illegal drugs. Some items may interact with your medicine. What should I watch for while using this medication? Visit your care team regularly. Tell your care team if your symptoms do not start to get better or if they get worse. You may need blood work done while you are taking this medication. You may need to follow a special diet. Talk to your care team. Foods that contain iron include: whole grains/cereals, dried fruits, beans, or peas, leafy green vegetables, and organ  meats (liver, kidney). What side effects may I notice from receiving this medication? Side effects that you should report to your care team as soon as possible: Allergic reactions--skin rash, itching, hives, swelling of the face, lips, tongue, or throat Low blood pressure--dizziness, feeling faint or lightheaded, blurry vision Shortness of breath Side effects that usually do not require medical attention (report to your care team if they continue or are  bothersome): Flushing Headache Joint pain Muscle pain Nausea Pain, redness, or irritation at injection site This list may not describe all possible side effects. Call your doctor for medical advice about side effects. You may report side effects to FDA at 1-800-FDA-1088. Where should I keep my medication? This medication is given in a hospital or clinic and will not be stored at home. NOTE: This sheet is a summary. It may not cover all possible information. If you have questions about this medicine, talk to your doctor, pharmacist, or health care provider.  2022 Elsevier/Gold Standard (2021-02-01 00:00:00)

## 2021-10-22 ENCOUNTER — Other Ambulatory Visit: Payer: Self-pay | Admitting: Hematology & Oncology

## 2021-10-22 DIAGNOSIS — C772 Secondary and unspecified malignant neoplasm of intra-abdominal lymph nodes: Secondary | ICD-10-CM

## 2021-10-29 ENCOUNTER — Inpatient Hospital Stay: Payer: Medicaid Other

## 2021-10-29 ENCOUNTER — Encounter: Payer: Self-pay | Admitting: Hematology & Oncology

## 2021-10-29 ENCOUNTER — Inpatient Hospital Stay: Payer: Medicaid Other | Attending: Gynecologic Oncology

## 2021-10-29 ENCOUNTER — Inpatient Hospital Stay (HOSPITAL_BASED_OUTPATIENT_CLINIC_OR_DEPARTMENT_OTHER): Payer: Medicaid Other | Admitting: Hematology & Oncology

## 2021-10-29 ENCOUNTER — Other Ambulatory Visit: Payer: Self-pay

## 2021-10-29 VITALS — BP 164/67 | HR 84 | Temp 98.0°F | Resp 18 | Ht 59.0 in | Wt 174.2 lb

## 2021-10-29 VITALS — BP 143/67 | HR 74 | Temp 97.6°F | Resp 18

## 2021-10-29 DIAGNOSIS — D510 Vitamin B12 deficiency anemia due to intrinsic factor deficiency: Secondary | ICD-10-CM | POA: Diagnosis not present

## 2021-10-29 DIAGNOSIS — C187 Malignant neoplasm of sigmoid colon: Secondary | ICD-10-CM

## 2021-10-29 DIAGNOSIS — C772 Secondary and unspecified malignant neoplasm of intra-abdominal lymph nodes: Secondary | ICD-10-CM | POA: Diagnosis not present

## 2021-10-29 DIAGNOSIS — S31109A Unspecified open wound of abdominal wall, unspecified quadrant without penetration into peritoneal cavity, initial encounter: Secondary | ICD-10-CM

## 2021-10-29 DIAGNOSIS — Z5111 Encounter for antineoplastic chemotherapy: Secondary | ICD-10-CM | POA: Insufficient documentation

## 2021-10-29 LAB — CBC WITH DIFFERENTIAL (CANCER CENTER ONLY)
Abs Immature Granulocytes: 0.02 10*3/uL (ref 0.00–0.07)
Basophils Absolute: 0 10*3/uL (ref 0.0–0.1)
Basophils Relative: 0 %
Eosinophils Absolute: 0.1 10*3/uL (ref 0.0–0.5)
Eosinophils Relative: 3 %
HCT: 32.8 % — ABNORMAL LOW (ref 36.0–46.0)
Hemoglobin: 10.7 g/dL — ABNORMAL LOW (ref 12.0–15.0)
Immature Granulocytes: 0 %
Lymphocytes Relative: 29 %
Lymphs Abs: 1.6 10*3/uL (ref 0.7–4.0)
MCH: 31.3 pg (ref 26.0–34.0)
MCHC: 32.6 g/dL (ref 30.0–36.0)
MCV: 95.9 fL (ref 80.0–100.0)
Monocytes Absolute: 0.5 10*3/uL (ref 0.1–1.0)
Monocytes Relative: 10 %
Neutro Abs: 3.1 10*3/uL (ref 1.7–7.7)
Neutrophils Relative %: 58 %
Platelet Count: 156 10*3/uL (ref 150–400)
RBC: 3.42 MIL/uL — ABNORMAL LOW (ref 3.87–5.11)
RDW: 17.2 % — ABNORMAL HIGH (ref 11.5–15.5)
WBC Count: 5.3 10*3/uL (ref 4.0–10.5)
nRBC: 0 % (ref 0.0–0.2)

## 2021-10-29 LAB — CMP (CANCER CENTER ONLY)
ALT: 26 U/L (ref 0–44)
AST: 23 U/L (ref 15–41)
Albumin: 3.9 g/dL (ref 3.5–5.0)
Alkaline Phosphatase: 43 U/L (ref 38–126)
Anion gap: 13 (ref 5–15)
BUN: 12 mg/dL (ref 8–23)
CO2: 24 mmol/L (ref 22–32)
Calcium: 8.8 mg/dL — ABNORMAL LOW (ref 8.9–10.3)
Chloride: 103 mmol/L (ref 98–111)
Creatinine: 0.61 mg/dL (ref 0.44–1.00)
GFR, Estimated: >60 mL/min (ref >60)
Glucose, Bld: 136 mg/dL — ABNORMAL HIGH (ref 70–99)
Potassium: 2.8 mmol/L — ABNORMAL LOW (ref 3.5–5.1)
Sodium: 140 mmol/L (ref 135–145)
Total Bilirubin: 0.5 mg/dL (ref 0.3–1.2)
Total Protein: 6.4 g/dL — ABNORMAL LOW (ref 6.5–8.1)

## 2021-10-29 LAB — IRON AND IRON BINDING CAPACITY (CC-WL,HP ONLY)
Iron: 112 ug/dL (ref 28–170)
Saturation Ratios: 34 % — ABNORMAL HIGH (ref 10.4–31.8)
TIBC: 330 ug/dL (ref 250–450)
UIBC: 218 ug/dL (ref 148–442)

## 2021-10-29 LAB — CEA (IN HOUSE-CHCC): CEA (CHCC-In House): 8.12 ng/mL — ABNORMAL HIGH (ref 0.00–5.00)

## 2021-10-29 LAB — FERRITIN: Ferritin: 594 ng/mL — ABNORMAL HIGH (ref 11–307)

## 2021-10-29 LAB — LACTATE DEHYDROGENASE: LDH: 133 U/L (ref 98–192)

## 2021-10-29 MED ORDER — ATROPINE SULFATE 1 MG/ML IV SOLN
0.5000 mg | Freq: Once | INTRAVENOUS | Status: AC | PRN
  Administered 2021-10-29: 0.5 mg via INTRAVENOUS
  Filled 2021-10-29: qty 1

## 2021-10-29 MED ORDER — SODIUM CHLORIDE 0.9 % IV SOLN
400.0000 mg/m2 | Freq: Once | INTRAVENOUS | Status: AC
  Administered 2021-10-29: 748 mg via INTRAVENOUS
  Filled 2021-10-29: qty 37.4

## 2021-10-29 MED ORDER — SODIUM CHLORIDE 0.9 % IV SOLN: Freq: Once | INTRAVENOUS | Status: AC

## 2021-10-29 MED ORDER — POTASSIUM CHLORIDE CRYS ER 20 MEQ PO TBCR: 20.0000 meq | EXTENDED_RELEASE_TABLET | Freq: Two times a day (BID) | ORAL | 4 refills | Status: DC

## 2021-10-29 MED ORDER — SODIUM CHLORIDE 0.9 % IV SOLN
10.0000 mg | Freq: Once | INTRAVENOUS | Status: AC
  Administered 2021-10-29: 10 mg via INTRAVENOUS
  Filled 2021-10-29: qty 10

## 2021-10-29 MED ORDER — SODIUM CHLORIDE 0.9 % IV SOLN: Freq: Once | INTRAVENOUS | Status: DC

## 2021-10-29 MED ORDER — FLUOROURACIL CHEMO INJECTION 2.5 GM/50ML
400.0000 mg/m2 | Freq: Once | INTRAVENOUS | Status: AC
  Administered 2021-10-29: 750 mg via INTRAVENOUS
  Filled 2021-10-29: qty 15

## 2021-10-29 MED ORDER — POTASSIUM CHLORIDE CRYS ER 20 MEQ PO TBCR
40.0000 meq | EXTENDED_RELEASE_TABLET | Freq: Two times a day (BID) | ORAL | Status: DC
  Administered 2021-10-29: 40 meq via ORAL

## 2021-10-29 MED ORDER — POTASSIUM CHLORIDE CRYS ER 20 MEQ PO TBCR
40.0000 meq | EXTENDED_RELEASE_TABLET | Freq: Two times a day (BID) | ORAL | Status: AC
  Administered 2021-10-29: 40 meq via ORAL
  Filled 2021-10-29 (×2): qty 2

## 2021-10-29 MED ORDER — SODIUM CHLORIDE 0.9 % IV SOLN
144.0000 mg/m2 | Freq: Once | INTRAVENOUS | Status: AC
  Administered 2021-10-29: 260 mg via INTRAVENOUS
  Filled 2021-10-29: qty 5

## 2021-10-29 MED ORDER — SODIUM CHLORIDE 0.9 % IV SOLN
1920.0000 mg/m2 | INTRAVENOUS | Status: AC
  Administered 2021-10-29: 3600 mg via INTRAVENOUS
  Filled 2021-10-29: qty 72

## 2021-10-29 MED ORDER — PALONOSETRON HCL INJECTION 0.25 MG/5ML
0.2500 mg | Freq: Once | INTRAVENOUS | Status: AC
  Administered 2021-10-29: 0.25 mg via INTRAVENOUS
  Filled 2021-10-29: qty 5

## 2021-10-29 NOTE — Addendum Note (Signed)
Addended by: Burney Gauze R on: 10/29/2021 10:16 AM   Modules accepted: Orders

## 2021-10-29 NOTE — Progress Notes (Signed)
Hematology and Oncology Follow Up Visit  Bailey Walker [id number] [birth year] 76 y.o. 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  I hate the fact that this abdominal wound still is not healing.  We did take a culture today.  She has been putting Silvadene cream on this.  She seems to be tolerating her chemotherapy pretty well.  Her last CEA level was down to 7.8.  She has had no nausea or vomiting.  Her blood sugars are under good control.  She has had no issues with fever.  She has had no rashes.  She has had no leg swelling.  There is been no problems with cough or shortness of breath.  Overall, I would have said that her performance status is probably ECOG 1.         Medications:  Allergies as of 10/29/2021       Reactions   Nitroglycerin Nausea And Vomiting   Ampicillin Other (See Comments)   Per allergy test        Medication List        Accurate as of October 29, 2021  9:37 AM. If you have any questions, ask your nurse or doctor.          acetaminophen 500 MG tablet Commonly known as: TYLENOL Take 500 mg by mouth in the morning and at bedtime.   alendronate 70 MG tablet Commonly known as: FOSAMAX Take 70 mg by mouth every Sunday.   aspirin EC 81 MG tablet Take 81 mg by mouth daily. Swallow whole.   atorvastatin 10 MG tablet Commonly known as: LIPITOR Take 2 tablets (20 mg total) by mouth daily.   atorvastatin 10 MG tablet Commonly known as: LIPITOR Take by mouth.   [facility] Gentle Laxative 5 MG EC tablet Generic drug: bisacodyl Take 5 mg by mouth daily as needed.   [facility] Purelax 17 GM/SCOOP  powder Generic drug: polyethylene glycol powder Take 17 g by mouth daily as needed.   dexamethasone 4 MG tablet Commonly known as: DECADRON Take 2 tablets (8 mg total) by mouth daily. Start the day after chemo for 2 days.   feeding supplement Liqd Take 237 mLs by mouth 3 (three) times daily between meals.   glipiZIDE 5 MG tablet Commonly known as: GLUCOTROL TAKE 1 TABLET BY MOUTH TWICE A DAY BEFORE MEALS   levothyroxine 75 MCG tablet Commonly known as: SYNTHROID TAKE 1 TABLET BY MOUTH EVERY DAY What changed: when to take this   loperamide 2 MG capsule Commonly known as: IMODIUM TAKE 2 CAPSULES (4 MG TOTAL) BY MOUTH AS NEEDED. TAKE 2 AT DIARRHEA ONSET , THEN 1 EVERY 2HR UNTIL 12HRS WITH NO BM. MAY TAKE 2 EVERY 4HRS AT NIGHT. IF DIARRHEA RECURS REPEAT.   metFORMIN 500 MG tablet Commonly known as: GLUCOPHAGE Take 0.5 tablets (250 mg total) by mouth 2 (two) times daily with a meal. What changed: how much to take   methocarbamol 500 MG tablet Commonly known as: ROBAXIN TAKE 1 TABLET BY MOUTH EVERY 12 HOURS AS NEEDED FOR MUSCLE SPASMS.   metoprolol tartrate 25 MG tablet  Commonly known as: LOPRESSOR Take 1 tablet (25 mg total) by mouth 2 (two) times daily.   morphine 15 MG 12 hr tablet Commonly known as: MS CONTIN Take 1 tablet (15 mg total) by mouth every 12 (twelve) hours.   morphine 15 MG tablet Commonly known as: MSIR Take 1 tablet (15 mg total) by mouth every 4 (four) hours as needed for severe pain.   multivitamin with minerals Tabs tablet Take 1 tablet by mouth daily.   ondansetron 8 MG tablet Commonly known as: ZOFRAN TAKE 1 TABLET (8 MG TOTAL) BY MOUTH 2 (TWO) TIMES DAILY AS NEEDED FOR REFRACTORY NAUSEA / VOMITING. START ON DAY 3 AFTER CHEMOTHERAPY.   OVER THE COUNTER MEDICATION Take 5 mLs by mouth at bedtime. Mucosolve - used for cough (pt got from [place])   pantoprazole 20 MG tablet Commonly known as: PROTONIX TAKE 1 TABLET BY MOUTH EVERY DAY    prednisoLONE acetate 1 % ophthalmic suspension Commonly known as: PRED FORTE Place 1 drop into both eyes every Monday.   prednisoLONE acetate 1 % ophthalmic suspension Commonly known as: PRED FORTE Apply to eye.   prochlorperazine 10 MG tablet Commonly known as: COMPAZINE Take 1 tablet (10 mg total) by mouth every 6 (six) hours as needed (NAUSEA).   silver sulfADIAZINE 1 % cream Commonly known as: Silvadene Apply 1 application topically daily.   traMADol 50 MG tablet Commonly known as: ULTRAM Take 1 tablet (50 mg total) by mouth every 6 (six) hours as needed.   Vitamin D (Ergocalciferol) 1.25 MG (50000 UNIT) Caps capsule Commonly known as: DRISDOL Take 50,000 Units by mouth once a week.        Allergies:  Allergies  Allergen Reactions   Nitroglycerin Nausea And Vomiting   Ampicillin Other (See Comments)    Per allergy test    Past Medical History, Surgical history, Social history, and Family History were reviewed and updated.  Review of Systems: Review of Systems  HENT: Negative.    Eyes: Negative.   Respiratory:  Positive for shortness of breath.   Cardiovascular: Negative.   Gastrointestinal:  Positive for abdominal pain.  Genitourinary: Negative.   Musculoskeletal:  Positive for back pain.  Skin: Negative.   Neurological: Negative.   Endo/Heme/Allergies: Negative.   Psychiatric/Behavioral: Negative.      Physical Exam:  height is '4\' 11"'  (1.499 m) and weight is 174 lb 4 oz (79 kg). Her oral temperature is 98 F (36.7 C). Her blood pressure is 164/67 (abnormal) and her pulse is 84. Her respiration is 18 and oxygen saturation is 100%.   Wt Readings from Last 3 Encounters:  10/29/21 174 lb 4 oz (79 kg)  10/14/21 173 lb (78.5 kg)  09/20/21 173 lb 0.6 oz (78.5 kg)    Physical Exam Vitals reviewed.  HENT:     Head: Normocephalic and atraumatic.  Eyes:     Pupils: Pupils are equal, round, and reactive to light.  Cardiovascular:     Rate and Rhythm:  Normal rate and regular rhythm.     Heart sounds: Normal heart sounds.  Pulmonary:     Effort: Pulmonary effort is normal.     Breath sounds: Normal breath sounds.  Abdominal:     General: Bowel sounds are normal.     Palpations: Abdomen is soft.     Comments: Her abdomen is slightly distended.  There is no obvious fluid wave.  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  She still has a dressing over  the abdominal laparotomy scar.  She does not have any obvious erythema or warmth.  There is no exudate coming from the scars.  She has decent bowel sounds.  There is no palpable liver or spleen tip.  Musculoskeletal:        General: No tenderness or deformity. Normal range of motion.     Cervical back: Normal range of motion.  Lymphadenopathy:     Cervical: No cervical adenopathy.  Skin:    General: Skin is warm and dry.     Findings: No erythema or rash.  Neurological:     Mental Status: She is alert and oriented to person, place, and time.  Psychiatric:        Behavior: Behavior normal.        Thought Content: Thought content normal.        Judgment: Judgment normal.     Lab Results  Component Value Date   WBC 5.3 10/29/2021   HGB 10.7 (L) 10/29/2021   HCT 32.8 (L) 10/29/2021   MCV 95.9 10/29/2021   PLT 156 10/29/2021   Lab Results  Component Value Date   FERRITIN 208 10/14/2021   IRON 51 10/14/2021   TIBC 349 10/14/2021   UIBC 298 10/14/2021   IRONPCTSAT 15 10/14/2021   Lab Results  Component Value Date   RETICCTPCT 1.9 01/05/2020   RBC 3.42 (L) 10/29/2021   No results found for: KPAFRELGTCHN, LAMBDASER, KAPLAMBRATIO No results found for: IGGSERUM, IGA, IGMSERUM No results found for: Odetta Pink, SPEI   Chemistry      Component Value Date/Time   NA 140 10/29/2021 0832   NA 139 07/12/2019 1110   K 2.8 (L) 10/29/2021 0832   CL 103 10/29/2021 0832   CO2 24 10/29/2021 0832   BUN 12 10/29/2021 0832   BUN 15  07/12/2019 1110   CREATININE 0.61 10/29/2021 0832   CREATININE 0.74 01/08/2017 1100      Component Value Date/Time   CALCIUM 8.8 (L) 10/29/2021 0832   ALKPHOS 43 10/29/2021 0832   AST 23 10/29/2021 0832   ALT 26 10/29/2021 0832   BILITOT 0.5 10/29/2021 0832       Impression and Plan: Ms. 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   She has recurrent disease.  We are treating with FOLFIRI.  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  We really cannot use Avastin because of the abdominal wound.  It is still not healed up well enough that a I feel confident using Avastin.  At least her CEA has been coming down.  This is certainly encouraging.  We will see what the culture shows.  I will plan to get her back in another 3 weeks.  Marland Kitchen    Volanda Napoleon, MD 2/7/20239:37 AM

## 2021-10-29 NOTE — Patient Instructions (Signed)
[facility]  Discharge Instructions: Thank you for choosing [facility] to provide your oncology and hematology care.   If you have a lab appointment with the [facility], please go directly to the [facility] and check in at the registration area.  Wear comfortable clothing and clothing appropriate for easy access to any Portacath or PICC line.   We strive to give you quality time with your provider. You may need to reschedule your appointment if you arrive late (15 or more minutes).  Arriving late affects you and other patients whose appointments are after yours.  Also, if you miss three or more appointments without notifying the office, you may be dismissed from the clinic at the providers discretion.      For prescription refill requests, have your pharmacy contact our office and allow 72 hours for refills to be completed.    Today you received the following chemotherapy and/or immunotherapy agents 5-fu, leucovorin, and irinotecan   To help prevent nausea and vomiting after your treatment, we encourage you to take your nausea medication as directed.  BELOW ARE SYMPTOMS THAT SHOULD BE REPORTED IMMEDIATELY: *FEVER GREATER THAN 100.4 F (38 C) OR HIGHER *CHILLS OR SWEATING *NAUSEA AND VOMITING THAT IS NOT CONTROLLED WITH YOUR NAUSEA MEDICATION *UNUSUAL SHORTNESS OF BREATH *UNUSUAL BRUISING OR BLEEDING *URINARY PROBLEMS (pain or burning when urinating, or frequent urination) *BOWEL PROBLEMS (unusual diarrhea, constipation, pain near the anus) TENDERNESS IN MOUTH AND THROAT WITH OR WITHOUT PRESENCE OF ULCERS (sore throat, sores in mouth, or a toothache) UNUSUAL RASH, SWELLING OR PAIN  UNUSUAL VAGINAL DISCHARGE OR ITCHING   Items with * indicate a potential emergency and should be followed up as soon as possible or go to the Emergency Department if any problems should occur.  Please show the CHEMOTHERAPY ALERT CARD or IMMUNOTHERAPY ALERT CARD  at check-in to the Emergency Department and triage nurse. Should you have questions after your visit or need to cancel or reschedule your appointment, please contact [facility]  [phone] and follow the prompts.  Office hours are 8:00 a.m. to 4:30 p.m. Monday - Friday. Please note that voicemails left after 4:00 p.m. may not be returned until the following business day.  We are closed weekends and major holidays. You have access to a nurse at all times for urgent questions. Please call the main number to the clinic [phone] and follow the prompts.  For any non-urgent questions, you may also contact your provider using MyChart. We now offer e-Visits for anyone 35 and older to request care online for non-urgent symptoms. For details visit [email or web address].   Also download the MyChart app! Go to the app store, search "MyChart", open the app, select [facility], and log in with your MyChart username and password.  Due to Covid, a mask is required upon entering the hospital/clinic. If you do not have a mask, one will be given to you upon arrival. For doctor visits, patients may have 1 support person aged 71 or older with them. For treatment visits, patients cannot have anyone with them due to current Covid guidelines and our immunocompromised population.

## 2021-10-29 NOTE — Patient Instructions (Signed)

## 2021-10-31 ENCOUNTER — Inpatient Hospital Stay: Payer: Medicaid Other

## 2021-10-31 ENCOUNTER — Telehealth: Payer: Self-pay

## 2021-10-31 ENCOUNTER — Other Ambulatory Visit: Payer: Self-pay

## 2021-10-31 VITALS — BP 136/63 | HR 71 | Temp 98.4°F

## 2021-10-31 DIAGNOSIS — C187 Malignant neoplasm of sigmoid colon: Secondary | ICD-10-CM

## 2021-10-31 DIAGNOSIS — Z5111 Encounter for antineoplastic chemotherapy: Secondary | ICD-10-CM | POA: Diagnosis not present

## 2021-10-31 DIAGNOSIS — C772 Secondary and unspecified malignant neoplasm of intra-abdominal lymph nodes: Secondary | ICD-10-CM

## 2021-10-31 LAB — AEROBIC CULTURE W GRAM STAIN (SUPERFICIAL SPECIMEN): Gram Stain: NONE SEEN

## 2021-10-31 MED ORDER — SODIUM CHLORIDE 0.9% FLUSH
10.0000 mL | INTRAVENOUS | Status: DC | PRN
  Administered 2021-10-31: 10 mL

## 2021-10-31 MED ORDER — HEPARIN SOD (PORK) LOCK FLUSH 100 UNIT/ML IV SOLN
500.0000 [IU] | Freq: Once | INTRAVENOUS | Status: AC | PRN
  Administered 2021-10-31: 500 [IU]

## 2021-10-31 MED ORDER — CIPROFLOXACIN HCL 500 MG PO TABS: 500.0000 mg | ORAL_TABLET | Freq: Two times a day (BID) | ORAL | 0 refills | Status: DC

## 2021-10-31 NOTE — Patient Instructions (Signed)
Fluorouracil, 5-FU injection What is this medication? FLUOROURACIL, 5-FU (flure oh YOOR a sil) is a chemotherapy drug. It slows the growth of cancer cells. This medicine is used to treat many types of cancer like breast cancer, colon or rectal cancer, pancreatic cancer, and stomach cancer. This medicine may be used for other purposes; ask your health care provider or pharmacist if you have questions. COMMON BRAND NAME(S): Adrucil What should I tell my care team before I take this medication? They need to know if you have any of these conditions: blood disorders dihydropyrimidine dehydrogenase (DPD) deficiency infection (especially a virus infection such as chickenpox, cold sores, or herpes) kidney disease liver disease malnourished, poor nutrition recent or ongoing radiation therapy an unusual or allergic reaction to fluorouracil, other chemotherapy, other medicines, foods, dyes, or preservatives pregnant or trying to get pregnant breast-feeding How should I use this medication? This drug is given as an infusion or injection into a vein. It is administered in a hospital or clinic by a specially trained health care professional. Talk to your pediatrician regarding the use of this medicine in children. Special care may be needed. Overdosage: If you think you have taken too much of this medicine contact a poison control center or emergency room at once. NOTE: This medicine is only for you. Do not share this medicine with others. What if I miss a dose? It is important not to miss your dose. Call your doctor or health care professional if you are unable to keep an appointment. What may interact with this medication? Do not take this medicine with any of the following medications: live virus vaccines This medicine may also interact with the following medications: medicines that treat or prevent blood clots like warfarin, enoxaparin, and dalteparin This list may not describe all possible  interactions. Give your health care provider a list of all the medicines, herbs, non-prescription drugs, or dietary supplements you use. Also tell them if you smoke, drink alcohol, or use illegal drugs. Some items may interact with your medicine. What should I watch for while using this medication? Visit your doctor for checks on your progress. This drug may make you feel generally unwell. This is not uncommon, as chemotherapy can affect healthy cells as well as cancer cells. Report any side effects. Continue your course of treatment even though you feel ill unless your doctor tells you to stop. In some cases, you may be given additional medicines to help with side effects. Follow all directions for their use. Call your doctor or health care professional for advice if you get a fever, chills or sore throat, or other symptoms of a cold or flu. Do not treat yourself. This drug decreases your body's ability to fight infections. Try to avoid being around people who are sick. This medicine may increase your risk to bruise or bleed. Call your doctor or health care professional if you notice any unusual bleeding. Be careful brushing and flossing your teeth or using a toothpick because you may get an infection or bleed more easily. If you have any dental work done, tell your dentist you are receiving this medicine. Avoid taking products that contain aspirin, acetaminophen, ibuprofen, naproxen, or ketoprofen unless instructed by your doctor. These medicines may hide a fever. Do not become pregnant while taking this medicine. Women should inform their doctor if they wish to become pregnant or think they might be pregnant. There is a potential for serious side effects to an unborn child. Talk to your health care   professional or pharmacist for more information. Do not breast-feed an infant while taking this medicine. Men should inform their doctor if they wish to father a child. This medicine may lower sperm  counts. Do not treat diarrhea with over the counter products. Contact your doctor if you have diarrhea that lasts more than 2 days or if it is severe and watery. This medicine can make you more sensitive to the sun. Keep out of the sun. If you cannot avoid being in the sun, wear protective clothing and use sunscreen. Do not use sun lamps or tanning beds/booths. What side effects may I notice from receiving this medication? Side effects that you should report to your doctor or health care professional as soon as possible: allergic reactions like skin rash, itching or hives, swelling of the face, lips, or tongue low blood counts - this medicine may decrease the number of white blood cells, red blood cells and platelets. You may be at increased risk for infections and bleeding. signs of infection - fever or chills, cough, sore throat, pain or difficulty passing urine signs of decreased platelets or bleeding - bruising, pinpoint red spots on the skin, black, tarry stools, blood in the urine signs of decreased red blood cells - unusually weak or tired, fainting spells, lightheadedness breathing problems changes in vision chest pain mouth sores nausea and vomiting pain, swelling, redness at site where injected pain, tingling, numbness in the hands or feet redness, swelling, or sores on hands or feet stomach pain unusual bleeding Side effects that usually do not require medical attention (report to your doctor or health care professional if they continue or are bothersome): changes in finger or toe nails diarrhea dry or itchy skin hair loss headache loss of appetite sensitivity of eyes to the light stomach upset unusually teary eyes This list may not describe all possible side effects. Call your doctor for medical advice about side effects. You may report side effects to FDA at 1-800-FDA-1088. Where should I keep my medication? This drug is given in a hospital or clinic and will not be  stored at home. NOTE: This sheet is a summary. It may not cover all possible information. If you have questions about this medicine, talk to your doctor, pharmacist, or health care provider.  2022 Elsevier/Gold Standard (2021-05-28 00:00:00)  

## 2021-10-31 NOTE — Telephone Encounter (Addendum)
Pt's niece [name] notified by phone of attached message. [facility] to [facility]  ----- Message from Volanda Napoleon, MD sent at 10/31/2021 12:17 PM EST ----- Call her niece -- there is Staph in the wound.  We need Cipro 500 mg po bid x 10 days.  Please call this in!!  Thanks!!  Laurey Arrow

## 2021-11-08 ENCOUNTER — Other Ambulatory Visit: Payer: Self-pay | Admitting: Hematology & Oncology

## 2021-11-12 ENCOUNTER — Other Ambulatory Visit: Payer: Self-pay | Admitting: Family Medicine

## 2021-11-19 ENCOUNTER — Telehealth: Payer: Self-pay

## 2021-11-19 ENCOUNTER — Inpatient Hospital Stay: Payer: Medicaid Other

## 2021-11-19 ENCOUNTER — Inpatient Hospital Stay (HOSPITAL_BASED_OUTPATIENT_CLINIC_OR_DEPARTMENT_OTHER): Payer: Medicaid Other | Admitting: Hematology & Oncology

## 2021-11-19 ENCOUNTER — Other Ambulatory Visit: Payer: Self-pay

## 2021-11-19 ENCOUNTER — Other Ambulatory Visit: Payer: Self-pay | Admitting: Hematology & Oncology

## 2021-11-19 ENCOUNTER — Encounter: Payer: Self-pay | Admitting: Hematology & Oncology

## 2021-11-19 VITALS — BP 147/65 | HR 84 | Temp 98.1°F | Resp 18 | Ht 59.0 in | Wt 164.5 lb

## 2021-11-19 DIAGNOSIS — C772 Secondary and unspecified malignant neoplasm of intra-abdominal lymph nodes: Secondary | ICD-10-CM

## 2021-11-19 DIAGNOSIS — S31109A Unspecified open wound of abdominal wall, unspecified quadrant without penetration into peritoneal cavity, initial encounter: Secondary | ICD-10-CM

## 2021-11-19 DIAGNOSIS — Z5111 Encounter for antineoplastic chemotherapy: Secondary | ICD-10-CM | POA: Diagnosis not present

## 2021-11-19 DIAGNOSIS — C187 Malignant neoplasm of sigmoid colon: Secondary | ICD-10-CM

## 2021-11-19 LAB — CMP (CANCER CENTER ONLY)
ALT: 17 U/L (ref 0–44)
AST: 20 U/L (ref 15–41)
Albumin: 3.7 g/dL (ref 3.5–5.0)
Alkaline Phosphatase: 42 U/L (ref 38–126)
Anion gap: 11 (ref 5–15)
BUN: 24 mg/dL — ABNORMAL HIGH (ref 8–23)
CO2: 30 mmol/L (ref 22–32)
Calcium: 9.5 mg/dL (ref 8.9–10.3)
Chloride: 98 mmol/L (ref 98–111)
Creatinine: 0.81 mg/dL (ref 0.44–1.00)
GFR, Estimated: >60 mL/min (ref >60)
Glucose, Bld: 136 mg/dL — ABNORMAL HIGH (ref 70–99)
Potassium: 2.6 mmol/L — CL (ref 3.5–5.1)
Sodium: 139 mmol/L (ref 135–145)
Total Bilirubin: 0.6 mg/dL (ref 0.3–1.2)
Total Protein: 6.4 g/dL — ABNORMAL LOW (ref 6.5–8.1)

## 2021-11-19 LAB — CBC WITH DIFFERENTIAL (CANCER CENTER ONLY)
Abs Immature Granulocytes: 0.01 10*3/uL (ref 0.00–0.07)
Basophils Absolute: 0 10*3/uL (ref 0.0–0.1)
Basophils Relative: 1 %
Eosinophils Absolute: 0.2 10*3/uL (ref 0.0–0.5)
Eosinophils Relative: 5 %
HCT: 33.5 % — ABNORMAL LOW (ref 36.0–46.0)
Hemoglobin: 11 g/dL — ABNORMAL LOW (ref 12.0–15.0)
Immature Granulocytes: 0 %
Lymphocytes Relative: 40 %
Lymphs Abs: 1.8 10*3/uL (ref 0.7–4.0)
MCH: 31.5 pg (ref 26.0–34.0)
MCHC: 32.8 g/dL (ref 30.0–36.0)
MCV: 96 fL (ref 80.0–100.0)
Monocytes Absolute: 0.8 10*3/uL (ref 0.1–1.0)
Monocytes Relative: 18 %
Neutro Abs: 1.7 10*3/uL (ref 1.7–7.7)
Neutrophils Relative %: 36 %
Platelet Count: 250 10*3/uL (ref 150–400)
RBC: 3.49 MIL/uL — ABNORMAL LOW (ref 3.87–5.11)
RDW: 15.7 % — ABNORMAL HIGH (ref 11.5–15.5)
WBC Count: 4.6 10*3/uL (ref 4.0–10.5)
nRBC: 0 % (ref 0.0–0.2)

## 2021-11-19 LAB — IRON AND IRON BINDING CAPACITY (CC-WL,HP ONLY)
Iron: 58 ug/dL (ref 28–170)
Saturation Ratios: 19 % (ref 10.4–31.8)
TIBC: 312 ug/dL (ref 250–450)
UIBC: 254 ug/dL (ref 148–442)

## 2021-11-19 LAB — CEA (IN HOUSE-CHCC): CEA (CHCC-In House): 7.08 ng/mL — ABNORMAL HIGH (ref 0.00–5.00)

## 2021-11-19 LAB — RETICULOCYTES
Immature Retic Fract: 30.6 % — ABNORMAL HIGH (ref 2.3–15.9)
RBC.: 3.49 MIL/uL — ABNORMAL LOW (ref 3.87–5.11)
Retic Count, Absolute: 107.8 10*3/uL (ref 19.0–186.0)
Retic Ct Pct: 3.1 % (ref 0.4–3.1)

## 2021-11-19 LAB — FERRITIN: Ferritin: 378 ng/mL — ABNORMAL HIGH (ref 11–307)

## 2021-11-19 MED ORDER — SODIUM CHLORIDE 0.9 % IV SOLN
10.0000 mg | Freq: Once | INTRAVENOUS | Status: AC
  Administered 2021-11-19: 10 mg via INTRAVENOUS
  Filled 2021-11-19: qty 10

## 2021-11-19 MED ORDER — SODIUM CHLORIDE 0.9 % IV SOLN: Freq: Once | INTRAVENOUS | Status: AC

## 2021-11-19 MED ORDER — PALONOSETRON HCL INJECTION 0.25 MG/5ML
0.2500 mg | Freq: Once | INTRAVENOUS | Status: AC
  Administered 2021-11-19: 0.25 mg via INTRAVENOUS
  Filled 2021-11-19: qty 5

## 2021-11-19 MED ORDER — FLUOROURACIL CHEMO INJECTION 2.5 GM/50ML
400.0000 mg/m2 | Freq: Once | INTRAVENOUS | Status: AC
  Administered 2021-11-19: 750 mg via INTRAVENOUS
  Filled 2021-11-19: qty 15

## 2021-11-19 MED ORDER — ATROPINE SULFATE 1 MG/ML IV SOLN
0.5000 mg | Freq: Once | INTRAVENOUS | Status: AC | PRN
  Administered 2021-11-19: 0.5 mg via INTRAVENOUS
  Filled 2021-11-19: qty 1

## 2021-11-19 MED ORDER — SODIUM CHLORIDE 0.9 % IV SOLN
144.0000 mg/m2 | Freq: Once | INTRAVENOUS | Status: AC
  Administered 2021-11-19: 260 mg via INTRAVENOUS
  Filled 2021-11-19: qty 5

## 2021-11-19 MED ORDER — POTASSIUM CHLORIDE CRYS ER 20 MEQ PO TBCR: 40.0000 meq | EXTENDED_RELEASE_TABLET | Freq: Two times a day (BID) | ORAL | Status: DC

## 2021-11-19 MED ORDER — SODIUM CHLORIDE 0.9 % IV SOLN
1920.0000 mg/m2 | INTRAVENOUS | Status: DC
  Administered 2021-11-19: 3600 mg via INTRAVENOUS
  Filled 2021-11-19: qty 72

## 2021-11-19 MED ORDER — SODIUM CHLORIDE 0.9 % IV SOLN
400.0000 mg/m2 | Freq: Once | INTRAVENOUS | Status: AC
  Administered 2021-11-19: 748 mg via INTRAVENOUS
  Filled 2021-11-19: qty 37.4

## 2021-11-19 NOTE — Telephone Encounter (Signed)
Critical potassium on 2.6 received from Richardson Landry in lab, MD aware.   Pt has not been taking oral potassium supplements at home.

## 2021-11-19 NOTE — Progress Notes (Signed)
Hematology and Oncology Follow Up Visit  Bailey Walker [id number] 1946-06-01 76 y.o. 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  Her niece is on the cell phone.  She still having some abdominal issues.  We had her on some Cipro for the possible abdominal wound infection.  We did cultures which were all negative.  She has she will see surgery next week.  Maybe they can provide any input with respect to the abdominal wound.  Her last CEA level was 8.12.  This is holding steady.  She has had no problems with the colostomy working.  There is no blood in the colostomy.  She is eating okay.  There is no nausea or vomiting.  She cannot take potassium at home.  As such, her potassium today is lower at 2.6.  Hopefully, we should be able to take the potassium at home.  She is trying to break the pills in half.  I realize that they are quite large.  She has had no cough or shortness of breath.  She has had no rashes.  There is been no leg swelling.  There is been no mouth sores.  Overall, I would say performance status is probably ECOG 1.     Medications:  Allergies as of 11/19/2021       Reactions   Nitroglycerin Nausea And Vomiting   Ampicillin Other (See Comments)   Per allergy test        Medication List        Accurate as of November 19, 2021  9:51 AM. If you have any questions, ask your nurse or doctor.          acetaminophen 500 MG tablet Commonly known as: TYLENOL Take 500 mg by mouth in the morning and at bedtime.   alendronate 70 MG tablet Commonly known as: FOSAMAX Take 70 mg by mouth every Sunday.    aspirin EC 81 MG tablet Take 81 mg by mouth daily. Swallow whole.   atorvastatin 10 MG tablet Commonly known as: LIPITOR Take 2 tablets (20 mg total) by mouth daily.   atorvastatin 10 MG tablet Commonly known as: LIPITOR Take by mouth.   ciprofloxacin 500 MG tablet Commonly known as: Cipro Take 1 tablet (500 mg total) by mouth 2 (two) times daily.   [employer] Gentle Laxative 5 MG EC tablet Generic drug: bisacodyl Take 5 mg by mouth daily as needed.   [employer]S Purelax 17 GM/SCOOP powder Generic drug: polyethylene glycol powder Take 17 g by mouth daily as needed.   dexamethasone 4 MG tablet Commonly known as: DECADRON Take 2 tablets (8 mg total) by mouth daily. Start the day after chemo for 2 days.   feeding supplement Liqd Take 237 mLs by mouth 3 (three) times daily between meals.   glipiZIDE 5 MG tablet Commonly known as: GLUCOTROL TAKE 1 TABLET BY MOUTH TWICE A DAY BEFORE MEALS   Klor-Con M20 20 MEQ tablet Generic drug: potassium chloride SA TAKE 1 TABLET BY MOUTH TWICE A DAY   levothyroxine 75 MCG tablet Commonly known as: SYNTHROID TAKE 1 TABLET BY MOUTH EVERY  DAY What changed: when to take this   loperamide 2 MG capsule Commonly known as: IMODIUM TAKE 2 CAPSULES (4 MG TOTAL) BY MOUTH AS NEEDED. TAKE 2 AT DIARRHEA ONSET , THEN 1 EVERY 2HR UNTIL 12HRS WITH NO BM. MAY TAKE 2 EVERY 4HRS AT NIGHT. IF DIARRHEA RECURS REPEAT.   metFORMIN 500 MG tablet Commonly known as: GLUCOPHAGE Take 0.5 tablets (250 mg total) by mouth 2 (two) times daily with a meal. What changed: how much to take   methocarbamol 500 MG tablet Commonly known as: ROBAXIN TAKE 1 TABLET BY MOUTH EVERY 12 HOURS AS NEEDED FOR MUSCLE SPASMS.   metoprolol tartrate 25 MG tablet Commonly known as: LOPRESSOR Take 1 tablet (25 mg total) by mouth 2 (two) times daily.   morphine 15 MG 12 hr tablet Commonly known as: MS CONTIN Take 1 tablet (15 mg total) by mouth every 12 (twelve) hours.   morphine 15 MG  tablet Commonly known as: MSIR Take 1 tablet (15 mg total) by mouth every 4 (four) hours as needed for severe pain.   multivitamin with minerals Tabs tablet Take 1 tablet by mouth daily.   ondansetron 8 MG tablet Commonly known as: ZOFRAN TAKE 1 TABLET (8 MG TOTAL) BY MOUTH 2 (TWO) TIMES DAILY AS NEEDED FOR REFRACTORY NAUSEA / VOMITING. START ON DAY 3 AFTER CHEMOTHERAPY.   OVER THE COUNTER MEDICATION Take 5 mLs by mouth at bedtime. Mucosolve - used for cough (pt got from [place])   pantoprazole 20 MG tablet Commonly known as: PROTONIX TAKE 1 TABLET BY MOUTH EVERY DAY   prednisoLONE acetate 1 % ophthalmic suspension Commonly known as: PRED FORTE Place 1 drop into both eyes every Monday.   prednisoLONE acetate 1 % ophthalmic suspension Commonly known as: PRED FORTE Apply to eye.   prochlorperazine 10 MG tablet Commonly known as: COMPAZINE Take 1 tablet (10 mg total) by mouth every 6 (six) hours as needed (NAUSEA).   silver sulfADIAZINE 1 % cream Commonly known as: Silvadene Apply 1 application topically daily.   Vitamin D (Ergocalciferol) 1.25 MG (50000 UNIT) Caps capsule Commonly known as: DRISDOL Take 50,000 Units by mouth once a week.        Allergies:  Allergies  Allergen Reactions   Nitroglycerin Nausea And Vomiting   Ampicillin Other (See Comments)    Per allergy test    Past Medical History, Surgical history, Social history, and Family History were reviewed and updated.  Review of Systems: Review of Systems  HENT: Negative.    Eyes: Negative.   Respiratory:  Positive for shortness of breath.   Cardiovascular: Negative.   Gastrointestinal:  Positive for abdominal pain.  Genitourinary: Negative.   Musculoskeletal:  Positive for back pain.  Skin: Negative.   Neurological: Negative.   Endo/Heme/Allergies: Negative.   Psychiatric/Behavioral: Negative.      Physical Exam:  height is _0  (1.499 m) and weight is 164 lb 8 oz (74.6 kg). Her oral  temperature is 98.1 F (36.7 C). Her blood pressure is 147/65 (abnormal) and her pulse is 84. Her respiration is 18 and oxygen saturation is 97%.   Wt Readings from Last 3 Encounters:  11/19/21 164 lb 8 oz (74.6 kg)  10/29/21 174 lb 4 oz (79 kg)  10/14/21 173 lb (78.5 kg)    Physical Exam Vitals reviewed.  HENT:     Head: Normocephalic and atraumatic.  Eyes:     Pupils: Pupils are equal, round, and reactive to light.  Cardiovascular:  Rate and Rhythm: Normal rate and regular rhythm.     Heart sounds: Normal heart sounds.  Pulmonary:     Effort: Pulmonary effort is normal.     Breath sounds: Normal breath sounds.  Abdominal:     General: Bowel sounds are normal.     Palpations: Abdomen is soft.     Comments: Her abdomen is slightly distended.  There is no obvious fluid wave.  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  She still has a dressing over the abdominal laparotomy scar.  She does not have any obvious erythema or warmth.  There is no exudate coming from the scars.  She has decent bowel sounds.  There is no palpable liver or spleen tip.  Musculoskeletal:        General: No tenderness or deformity. Normal range of motion.     Cervical back: Normal range of motion.  Lymphadenopathy:     Cervical: No cervical adenopathy.  Skin:    General: Skin is warm and dry.     Findings: No erythema or rash.  Neurological:     Mental Status: She is alert and oriented to person, place, and time.  Psychiatric:        Behavior: Behavior normal.        Thought Content: Thought content normal.        Judgment: Judgment normal.     Lab Results  Component Value Date   WBC 4.6 11/19/2021   HGB 11.0 (L) 11/19/2021   HCT 33.5 (L) 11/19/2021   MCV 96.0 11/19/2021   PLT 250 11/19/2021   Lab Results  Component Value Date   FERRITIN 594 (H) 10/29/2021   IRON 112 10/29/2021   TIBC 330 10/29/2021   UIBC 218 10/29/2021   IRONPCTSAT 34 (H) 10/29/2021   Lab Results  Component Value  Date   RETICCTPCT 3.1 11/19/2021   RBC 3.49 (L) 11/19/2021   RBC 3.49 (L) 11/19/2021   No results found for: KPAFRELGTCHN, LAMBDASER, KAPLAMBRATIO No results found for: IGGSERUM, IGA, IGMSERUM No results found for: Odetta Pink, SPEI   Chemistry      Component Value Date/Time   NA 139 11/19/2021 0911   NA 139 07/12/2019 1110   K 2.6 (LL) 11/19/2021 0911   CL 98 11/19/2021 0911   CO2 30 11/19/2021 0911   BUN 24 (H) 11/19/2021 0911   BUN 15 07/12/2019 1110   CREATININE 0.81 11/19/2021 0911   CREATININE 0.74 01/08/2017 1100      Component Value Date/Time   CALCIUM 9.5 11/19/2021 0911   ALKPHOS 42 11/19/2021 0911   AST 20 11/19/2021 0911   ALT 17 11/19/2021 0911   BILITOT 0.6 11/19/2021 0911       Impression and Plan: Ms. 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   She has recurrent disease.  We are treating with FOLFIRI.  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  We really cannot use Avastin because of the abdominal wound.  It is still not healed up well enough that a I feel confident using Avastin.  Will be interesting to see with his CEA level is.  We will go ahead with her seventh cycle of treatment.  Again, she sees surgery next week.  Maybe they can provide some help with respect to her abdominal wound.  We will  plan to get her back in another 3 weeks.  Typically, 3-week intervals is what works best for her and her blood counts.      Volanda Napoleon, MD 2/28/20239:51 AM

## 2021-11-21 ENCOUNTER — Other Ambulatory Visit: Payer: Self-pay

## 2021-11-21 ENCOUNTER — Inpatient Hospital Stay: Payer: Medicaid Other | Attending: Gynecologic Oncology

## 2021-11-21 VITALS — BP 134/52 | HR 81 | Temp 98.2°F | Resp 19

## 2021-11-21 DIAGNOSIS — D510 Vitamin B12 deficiency anemia due to intrinsic factor deficiency: Secondary | ICD-10-CM | POA: Diagnosis not present

## 2021-11-21 DIAGNOSIS — Z5111 Encounter for antineoplastic chemotherapy: Secondary | ICD-10-CM | POA: Diagnosis present

## 2021-11-21 DIAGNOSIS — C187 Malignant neoplasm of sigmoid colon: Secondary | ICD-10-CM | POA: Insufficient documentation

## 2021-11-21 MED ORDER — SODIUM CHLORIDE 0.9% FLUSH
10.0000 mL | INTRAVENOUS | Status: DC | PRN
  Administered 2021-11-21: 10 mL

## 2021-11-21 MED ORDER — HEPARIN SOD (PORK) LOCK FLUSH 100 UNIT/ML IV SOLN
500.0000 [IU] | Freq: Once | INTRAVENOUS | Status: AC | PRN
  Administered 2021-11-21: 500 [IU]

## 2021-12-09 ENCOUNTER — Other Ambulatory Visit: Payer: Self-pay | Admitting: *Deleted

## 2021-12-09 ENCOUNTER — Other Ambulatory Visit: Payer: Self-pay | Admitting: Hematology & Oncology

## 2021-12-09 DIAGNOSIS — C772 Secondary and unspecified malignant neoplasm of intra-abdominal lymph nodes: Secondary | ICD-10-CM

## 2021-12-10 ENCOUNTER — Inpatient Hospital Stay (HOSPITAL_BASED_OUTPATIENT_CLINIC_OR_DEPARTMENT_OTHER): Payer: Medicaid Other | Admitting: Hematology & Oncology

## 2021-12-10 ENCOUNTER — Inpatient Hospital Stay: Payer: Medicaid Other

## 2021-12-10 ENCOUNTER — Ambulatory Visit (HOSPITAL_BASED_OUTPATIENT_CLINIC_OR_DEPARTMENT_OTHER)
Admission: RE | Admit: 2021-12-10 | Discharge: 2021-12-10 | Disposition: A | Discharge status: Home / Self Care | Payer: Medicaid Other | Source: Ambulatory Visit | Attending: Hematology & Oncology | Admitting: Hematology & Oncology

## 2021-12-10 ENCOUNTER — Encounter: Payer: Self-pay | Admitting: Hematology & Oncology

## 2021-12-10 ENCOUNTER — Other Ambulatory Visit: Payer: Self-pay

## 2021-12-10 VITALS — BP 116/67 | HR 83 | Temp 98.0°F | Resp 18 | Wt 169.0 lb

## 2021-12-10 DIAGNOSIS — C187 Malignant neoplasm of sigmoid colon: Secondary | ICD-10-CM

## 2021-12-10 DIAGNOSIS — M79605 Pain in left leg: Secondary | ICD-10-CM

## 2021-12-10 DIAGNOSIS — C772 Secondary and unspecified malignant neoplasm of intra-abdominal lymph nodes: Secondary | ICD-10-CM | POA: Diagnosis not present

## 2021-12-10 LAB — COMPREHENSIVE METABOLIC PANEL
ALT: 31 U/L (ref 0–44)
AST: 25 U/L (ref 15–41)
Albumin: 4.2 g/dL (ref 3.5–5.0)
Alkaline Phosphatase: 51 U/L (ref 38–126)
Anion gap: 12 (ref 5–15)
BUN: 17 mg/dL (ref 8–23)
CO2: 22 mmol/L (ref 22–32)
Calcium: 9.6 mg/dL (ref 8.9–10.3)
Chloride: 104 mmol/L (ref 98–111)
Creatinine, Ser: 0.67 mg/dL (ref 0.44–1.00)
GFR, Estimated: >60 mL/min (ref >60)
Glucose, Bld: 149 mg/dL — ABNORMAL HIGH (ref 70–99)
Potassium: 4.4 mmol/L (ref 3.5–5.1)
Sodium: 138 mmol/L (ref 135–145)
Total Bilirubin: 0.4 mg/dL (ref 0.3–1.2)
Total Protein: 7.1 g/dL (ref 6.5–8.1)

## 2021-12-10 LAB — CBC WITH DIFFERENTIAL (CANCER CENTER ONLY)
Abs Immature Granulocytes: 0.02 10*3/uL (ref 0.00–0.07)
Basophils Absolute: 0.1 10*3/uL (ref 0.0–0.1)
Basophils Relative: 1 %
Eosinophils Absolute: 0.2 10*3/uL (ref 0.0–0.5)
Eosinophils Relative: 5 %
HCT: 35.5 % — ABNORMAL LOW (ref 36.0–46.0)
Hemoglobin: 11.6 g/dL — ABNORMAL LOW (ref 12.0–15.0)
Immature Granulocytes: 0 %
Lymphocytes Relative: 43 %
Lymphs Abs: 1.9 10*3/uL (ref 0.7–4.0)
MCH: 32.2 pg (ref 26.0–34.0)
MCHC: 32.7 g/dL (ref 30.0–36.0)
MCV: 98.6 fL (ref 80.0–100.0)
Monocytes Absolute: 0.7 10*3/uL (ref 0.1–1.0)
Monocytes Relative: 16 %
Neutro Abs: 1.6 10*3/uL — ABNORMAL LOW (ref 1.7–7.7)
Neutrophils Relative %: 35 %
Platelet Count: 235 10*3/uL (ref 150–400)
RBC: 3.6 MIL/uL — ABNORMAL LOW (ref 3.87–5.11)
RDW: 14.6 % (ref 11.5–15.5)
WBC Count: 4.5 10*3/uL (ref 4.0–10.5)
nRBC: 0 % (ref 0.0–0.2)

## 2021-12-10 LAB — CEA (IN HOUSE-CHCC): CEA (CHCC-In House): 13.12 ng/mL — ABNORMAL HIGH (ref 0.00–5.00)

## 2021-12-10 MED ORDER — PALONOSETRON HCL INJECTION 0.25 MG/5ML
0.2500 mg | Freq: Once | INTRAVENOUS | Status: AC
  Administered 2021-12-10: 0.25 mg via INTRAVENOUS
  Filled 2021-12-10: qty 5

## 2021-12-10 MED ORDER — FLUOROURACIL CHEMO INJECTION 2.5 GM/50ML
400.0000 mg/m2 | Freq: Once | INTRAVENOUS | Status: AC
  Administered 2021-12-10: 750 mg via INTRAVENOUS
  Filled 2021-12-10: qty 15

## 2021-12-10 MED ORDER — SODIUM CHLORIDE 0.9 % IV SOLN: Freq: Once | INTRAVENOUS | Status: AC

## 2021-12-10 MED ORDER — SODIUM CHLORIDE 0.9 % IV SOLN: Freq: Once | INTRAVENOUS | Status: DC

## 2021-12-10 MED ORDER — SODIUM CHLORIDE 0.9 % IV SOLN
10.0000 mg | Freq: Once | INTRAVENOUS | Status: AC
  Administered 2021-12-10: 10 mg via INTRAVENOUS
  Filled 2021-12-10: qty 10

## 2021-12-10 MED ORDER — HEPARIN SOD (PORK) LOCK FLUSH 100 UNIT/ML IV SOLN: 500.0000 [IU] | Freq: Once | INTRAVENOUS | Status: DC | PRN

## 2021-12-10 MED ORDER — ATROPINE SULFATE 1 MG/ML IV SOLN
0.5000 mg | Freq: Once | INTRAVENOUS | Status: AC | PRN
  Administered 2021-12-10: 0.5 mg via INTRAVENOUS
  Filled 2021-12-10: qty 1

## 2021-12-10 MED ORDER — SODIUM CHLORIDE 0.9% FLUSH: 10.0000 mL | INTRAVENOUS | Status: DC | PRN

## 2021-12-10 MED ORDER — SODIUM CHLORIDE 0.9 % IV SOLN
400.0000 mg/m2 | Freq: Once | INTRAVENOUS | Status: AC
  Administered 2021-12-10: 748 mg via INTRAVENOUS
  Filled 2021-12-10: qty 37.4

## 2021-12-10 MED ORDER — SODIUM CHLORIDE 0.9 % IV SOLN
144.0000 mg/m2 | Freq: Once | INTRAVENOUS | Status: AC
  Administered 2021-12-10: 260 mg via INTRAVENOUS
  Filled 2021-12-10: qty 8

## 2021-12-10 MED ORDER — SODIUM CHLORIDE 0.9 % IV SOLN
1920.0000 mg/m2 | INTRAVENOUS | Status: DC
  Administered 2021-12-10: 3600 mg via INTRAVENOUS
  Filled 2021-12-10: qty 72

## 2021-12-10 NOTE — Patient Instructions (Signed)
[facility]  Discharge Instructions: ?Thank you for choosing [facility] to provide your oncology and hematology care.  ? ?If you have a lab appointment with the [facility], please go directly to the [facility] and check in at the registration area. ? ?Wear comfortable clothing and clothing appropriate for easy access to any Portacath or PICC line.  ? ?We strive to give you quality time with your provider. You may need to reschedule your appointment if you arrive late (15 or more minutes).  Arriving late affects you and other patients whose appointments are after yours.  Also, if you miss three or more appointments without notifying the office, you may be dismissed from the clinic at the provider?s discretion.    ?  ?For prescription refill requests, have your pharmacy contact our office and allow 72 hours for refills to be completed.   ? ?Today you received the following chemotherapy and/or immunotherapy agents 5-fu, leucovorin, and irinotecan ?  ?To help prevent nausea and vomiting after your treatment, we encourage you to take your nausea medication as directed. ? ?BELOW ARE SYMPTOMS THAT SHOULD BE REPORTED IMMEDIATELY: ?*FEVER GREATER THAN 100.4 F (38 ?C) OR HIGHER ?*CHILLS OR SWEATING ?*NAUSEA AND VOMITING THAT IS NOT CONTROLLED WITH YOUR NAUSEA MEDICATION ?*UNUSUAL SHORTNESS OF BREATH ?*UNUSUAL BRUISING OR BLEEDING ?*URINARY PROBLEMS (pain or burning when urinating, or frequent urination) ?*BOWEL PROBLEMS (unusual diarrhea, constipation, pain near the anus) ?TENDERNESS IN MOUTH AND THROAT WITH OR WITHOUT PRESENCE OF ULCERS (sore throat, sores in mouth, or a toothache) ?UNUSUAL RASH, SWELLING OR PAIN  ?UNUSUAL VAGINAL DISCHARGE OR ITCHING  ? ?Items with * indicate a potential emergency and should be followed up as soon as possible or go to the Emergency Department if any problems should occur. ? ?Please show the CHEMOTHERAPY ALERT CARD or IMMUNOTHERAPY ALERT CARD  at check-in to the Emergency Department and triage nurse. ?Should you have questions after your visit or need to cancel or reschedule your appointment, please contact [facility]  [phone] and follow the prompts.  Office hours are 8:00 a.m. to 4:30 p.m. Monday - Friday. Please note that voicemails left after 4:00 p.m. may not be returned until the following business day.  We are closed weekends and major holidays. You have access to a nurse at all times for urgent questions. Please call the main number to the clinic [phone] and follow the prompts. ? ?For any non-urgent questions, you may also contact your provider using MyChart. We now offer e-Visits for anyone 31 and older to request care online for non-urgent symptoms. For details visit [email or web address]. ?  ?Also download the MyChart app! Go to the app store, search "MyChart", open the app, select [facility], and log in with your MyChart username and password. ? ?Due to Covid, a mask is required upon entering the hospital/clinic. If you do not have a mask, one will be given to you upon arrival. For doctor visits, patients may have 1 support person aged 39 or older with them. For treatment visits, patients cannot have anyone with them due to current Covid guidelines and our immunocompromised population.  ?

## 2021-12-10 NOTE — Progress Notes (Signed)
?Hematology and Oncology Follow Up Visit ? ?Bailey Walker ?[id number] ?05/16/1946 76 y.o. ?12/10/2021 ? ? 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  ?Current Therapy:     ?FOLFOX -- started on 06/11/2020 --  s/p cycle #8 - complete on 10/22/2020 ?Vitamin B12 1000 mcg IM every 3 months  ?FOLFIRI -- s/p cycle #7-- start on 07/31/2021 ?  ?Interim History:  [name] is here today for follow-up.  Her niece comes in with her.  They did the surgery.  Unfortunate, surgery really cannot help her out.  They said that in a year, they would operate on her if her cancer was in remission are under control. ? ?She still has a problem with the abdominal wound.  There is still some drainage from the abdominal wound.  The surgeon did not think that this needed any type of antibiotic. ? ?Her colostomy is still working okay. ? ?There is no issues with cough or shortness of breath.  She has had a little bit of pain behind the left lower leg.  There is no swelling.  However, we probably need to check her for a blood clot. ? ?Her last CEA level was down to 7.1. ? ?She is eating okay. ? ?There is no headache. ? ?She has not had any obvious bleeding. ? ?Overall, I would say performance status is ECOG 1.   ? ?Medications:  ?Allergies as of 12/10/2021   ? ?   Reactions  ? Nitroglycerin Nausea And Vomiting  ? Ampicillin Other (See Comments)  ? Per allergy test  ? ?  ? ?  ?Medication List  ?  ? ?  ? Accurate as of December 10, 2021  9:58 AM. If you have any questions, ask your nurse or doctor.  ?  ?  ? ?  ? ?acetaminophen 500 MG tablet ?Commonly known as: TYLENOL ?Take 500 mg by mouth in the morning and at bedtime. ?  ?alendronate 70 MG tablet ?Commonly known as: FOSAMAX ?Take 70 mg by mouth every Sunday. ?  ?aspirin EC 81 MG tablet ?Take 81 mg by mouth daily. Swallow whole. ?  ?atorvastatin 10 MG tablet ?Commonly known as:  LIPITOR ?Take 2 tablets (20 mg total) by mouth daily. ?  ?atorvastatin 10 MG tablet ?Commonly known as: LIPITOR ?Take by mouth. ?  ?ciprofloxacin 500 MG tablet ?Commonly known as: Cipro ?Take 1 tablet (500 mg total) by mouth 2 (two) times daily. ?  ?CVS Gentle Laxative 5 MG EC tablet ?Generic drug: bisacodyl ?Take 5 mg by mouth daily as needed. ?  ?CVS Purelax 17 GM/SCOOP powder ?Generic drug: polyethylene glycol powder ?Take 17 g by mouth daily as needed. ?  ?dexamethasone 4 MG tablet ?Commonly known as: DECADRON ?Take 2 tablets (8 mg total) by mouth daily. Start the day after chemo for 2 days. ?  ?feeding supplement Liqd ?Take 237 mLs by mouth 3 (three) times daily between meals. ?  ?glipiZIDE 5 MG tablet ?Commonly known as: GLUCOTROL ?TAKE 1 TABLET BY MOUTH TWICE A DAY BEFORE MEALS ?  ?Klor-Con M20 20 MEQ tablet ?Generic drug: potassium chloride SA ?TAKE 1 TABLET BY MOUTH TWICE A DAY ?  ?levothyroxine 75 MCG tablet ?Commonly known as: SYNTHROID ?TAKE 1 TABLET BY MOUTH EVERY DAY ?What changed: when to take this ?  ?loperamide 2 MG capsule ?Commonly known as: IMODIUM ?TAKE 2 CAPSULES (4 MG TOTAL) BY MOUTH AS NEEDED. TAKE 2 AT  DIARRHEA ONSET , THEN 1 EVERY 2HR UNTIL 12HRS WITH NO BM. MAY TAKE 2 EVERY 4HRS AT NIGHT. IF DIARRHEA RECURS REPEAT. ?  ?metFORMIN 500 MG tablet ?Commonly known as: GLUCOPHAGE ?Take 0.5 tablets (250 mg total) by mouth 2 (two) times daily with a meal. ?What changed: how much to take ?  ?methocarbamol 500 MG tablet ?Commonly known as: ROBAXIN ?TAKE 1 TABLET BY MOUTH EVERY 12 HOURS AS NEEDED FOR MUSCLE SPASMS. ?  ?metoprolol tartrate 25 MG tablet ?Commonly known as: LOPRESSOR ?Take 1 tablet (25 mg total) by mouth 2 (two) times daily. ?  ?morphine 15 MG 12 hr tablet ?Commonly known as: MS CONTIN ?Take 1 tablet (15 mg total) by mouth every 12 (twelve) hours. ?  ?morphine 15 MG tablet ?Commonly known as: MSIR ?Take 1 tablet (15 mg total) by mouth every 4 (four) hours as needed for severe pain. ?   ?multivitamin with minerals Tabs tablet ?Take 1 tablet by mouth daily. ?  ?ondansetron 8 MG tablet ?Commonly known as: ZOFRAN ?TAKE 1 TABLET (8 MG TOTAL) BY MOUTH 2 (TWO) TIMES DAILY AS NEEDED FOR REFRACTORY NAUSEA / VOMITING. START ON DAY 3 AFTER CHEMOTHERAPY. ?  ?OVER THE COUNTER MEDICATION ?Take 5 mLs by mouth at bedtime. Mucosolve - used for cough (pt got from [place]) ?  ?pantoprazole 20 MG tablet ?Commonly known as: PROTONIX ?TAKE 1 TABLET BY MOUTH EVERY DAY ?  ?prednisoLONE acetate 1 % ophthalmic suspension ?Commonly known as: PRED FORTE ?Place 1 drop into both eyes every Monday. ?  ?prednisoLONE acetate 1 % ophthalmic suspension ?Commonly known as: PRED FORTE ?Apply to eye. ?  ?prochlorperazine 10 MG tablet ?Commonly known as: COMPAZINE ?Take 1 tablet (10 mg total) by mouth every 6 (six) hours as needed (NAUSEA). ?  ?silver sulfADIAZINE 1 % cream ?Commonly known as: Silvadene ?Apply 1 application topically daily. ?  ?Vitamin D (Ergocalciferol) 1.25 MG (50000 UNIT) Caps capsule ?Commonly known as: DRISDOL ?Take 50,000 Units by mouth once a week. ?  ? ?  ? ? ?Allergies:  ?Allergies  ?Allergen Reactions  ? Nitroglycerin Nausea And Vomiting  ? Ampicillin Other (See Comments)  ?  Per allergy test  ? ? ?Past Medical History, Surgical history, Social history, and Family History were reviewed and updated. ? ?Review of Systems: ?Review of Systems  ?HENT: Negative.    ?Eyes: Negative.   ?Respiratory:  Positive for shortness of breath.   ?Cardiovascular: Negative.   ?Gastrointestinal:  Positive for abdominal pain.  ?Genitourinary: Negative.   ?Musculoskeletal:  Positive for back pain.  ?Skin: Negative.   ?Neurological: Negative.   ?Endo/Heme/Allergies: Negative.   ?Psychiatric/Behavioral: Negative.    ? ? ?Physical Exam: ? weight is 169 lb (76.7 kg). Her oral temperature is 98 ?F (36.7 ?C). Her blood pressure is 116/67 and her pulse is 83. Her respiration is 18 and oxygen saturation is 100%.  ? ?Wt Readings from Last  3 Encounters:  ?12/10/21 169 lb (76.7 kg)  ?11/19/21 164 lb 8 oz (74.6 kg)  ?10/29/21 174 lb 4 oz (79 kg)  ? ? ?Physical Exam ?Vitals reviewed.  ?HENT:  ?   Head: Normocephalic and atraumatic.  ?Eyes:  ?   Pupils: Pupils are equal, round, and reactive to light.  ?Cardiovascular:  ?   Rate and Rhythm: Normal rate and regular rhythm.  ?   Heart sounds: Normal heart sounds.  ?Pulmonary:  ?   Effort: Pulmonary effort is normal.  ?   Breath sounds: Normal breath sounds.  ?Abdominal:  ?  General: Bowel sounds are normal.  ?   Palpations: Abdomen is soft.  ?   Comments: Her abdomen is slightly distended.  There is no obvious fluid wave.  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  She still has a dressing over the abdominal laparotomy scar.  She does not have any obvious erythema or warmth.  There is no exudate coming from the scars.  She has decent bowel sounds.  There is no palpable liver or spleen tip.  ?Musculoskeletal:     ?   General: No tenderness or deformity. Normal range of motion.  ?   Cervical back: Normal range of motion.  ?Lymphadenopathy:  ?   Cervical: No cervical adenopathy.  ?Skin: ?   General: Skin is warm and dry.  ?   Findings: No erythema or rash.  ?Neurological:  ?   Mental Status: She is alert and oriented to person, place, and time.  ?Psychiatric:     ?   Behavior: Behavior normal.     ?   Thought Content: Thought content normal.     ?   Judgment: Judgment normal.  ? ? ? ?Lab Results  ?Component Value Date  ? WBC 4.5 12/10/2021  ? HGB 11.6 (L) 12/10/2021  ? HCT 35.5 (L) 12/10/2021  ? MCV 98.6 12/10/2021  ? PLT 235 12/10/2021  ? ?Lab Results  ?Component Value Date  ? FERRITIN 378 (H) 11/19/2021  ? IRON 58 11/19/2021  ? TIBC 312 11/19/2021  ? UIBC 254 11/19/2021  ? IRONPCTSAT 19 11/19/2021  ? ?Lab Results  ?Component Value Date  ? RETICCTPCT 3.1 11/19/2021  ? RBC 3.60 (L) 12/10/2021  ? ?No results found for: KPAFRELGTCHN, LAMBDASER, KAPLAMBRATIO ?No results found for: IGGSERUM, IGA, IGMSERUM ?No  results found for: TOTALPROTELP, ALBUMINELP, A1GS, A2GS, BETS, BETA2SER, GAMS, MSPIKE, SPEI ?  Chemistry   ?   ?Component Value Date/Time  ? NA 138 12/10/2021 0915  ? NA 139 07/12/2019 1110  ? K 4.4 12/10/2021 0915

## 2021-12-10 NOTE — Patient Instructions (Signed)

## 2021-12-11 ENCOUNTER — Telehealth: Payer: Self-pay

## 2021-12-11 NOTE — Telephone Encounter (Signed)
-----   Message from Volanda Napoleon, MD sent at 12/11/2021  6:33 AM EDT ----- ?Call her niece -- No blood clot in the leg!!  Laurey Arrow ?

## 2021-12-11 NOTE — Telephone Encounter (Signed)
Called and informed patient's neice of results (ok per DPR). Neice verbalized understanding and denies any questions or concerns at this time.  ? ?

## 2021-12-12 ENCOUNTER — Other Ambulatory Visit: Payer: Self-pay

## 2021-12-12 ENCOUNTER — Inpatient Hospital Stay: Payer: Medicaid Other

## 2021-12-12 VITALS — BP 122/53 | HR 71 | Temp 98.3°F | Resp 18

## 2021-12-12 DIAGNOSIS — Z95828 Presence of other vascular implants and grafts: Secondary | ICD-10-CM

## 2021-12-12 DIAGNOSIS — C772 Secondary and unspecified malignant neoplasm of intra-abdominal lymph nodes: Secondary | ICD-10-CM

## 2021-12-12 DIAGNOSIS — Z5111 Encounter for antineoplastic chemotherapy: Secondary | ICD-10-CM | POA: Diagnosis not present

## 2021-12-12 MED ORDER — HEPARIN SOD (PORK) LOCK FLUSH 100 UNIT/ML IV SOLN: 500.0000 [IU] | Freq: Once | INTRAVENOUS | Status: DC | PRN

## 2021-12-12 MED ORDER — SODIUM CHLORIDE 0.9% FLUSH
10.0000 mL | INTRAVENOUS | Status: DC | PRN
  Administered 2021-12-12: 10 mL

## 2021-12-12 MED ORDER — SODIUM CHLORIDE 0.9% FLUSH: 10.0000 mL | Freq: Once | INTRAVENOUS | Status: DC

## 2021-12-12 MED ORDER — HEPARIN SOD (PORK) LOCK FLUSH 100 UNIT/ML IV SOLN
500.0000 [IU] | Freq: Once | INTRAVENOUS | Status: AC
  Administered 2021-12-12: 500 [IU] via INTRAVENOUS

## 2021-12-12 NOTE — Patient Instructions (Signed)

## 2021-12-13 ENCOUNTER — Telehealth (HOSPITAL_BASED_OUTPATIENT_CLINIC_OR_DEPARTMENT_OTHER): Payer: Self-pay

## 2021-12-16 ENCOUNTER — Other Ambulatory Visit: Payer: Self-pay | Admitting: *Deleted

## 2021-12-16 ENCOUNTER — Other Ambulatory Visit: Payer: Self-pay | Admitting: Hematology & Oncology

## 2021-12-16 MED ORDER — PANTOPRAZOLE SODIUM 20 MG PO TBEC: 20.0000 mg | DELAYED_RELEASE_TABLET | Freq: Every day | ORAL | 3 refills | Status: DC

## 2021-12-17 ENCOUNTER — Telehealth: Payer: Self-pay | Admitting: *Deleted

## 2021-12-17 NOTE — Telephone Encounter (Signed)
Niece- [name] called pt c/o of ears, neck, mouth sore, difficulty swallowing. Instructed sandra to take pt to walk in clinic for evaluation as this could be a sinus infection. Pt denied white patches in mouth, no discolored mucus or sputum.  [name] verbalized understanding. ?

## 2021-12-27 ENCOUNTER — Ambulatory Visit (HOSPITAL_BASED_OUTPATIENT_CLINIC_OR_DEPARTMENT_OTHER)
Admission: RE | Admit: 2021-12-27 | Discharge: 2021-12-27 | Disposition: A | Discharge status: Home / Self Care | Payer: Medicaid Other | Source: Ambulatory Visit | Attending: Hematology & Oncology | Admitting: Hematology & Oncology

## 2021-12-27 ENCOUNTER — Ambulatory Visit (HOSPITAL_BASED_OUTPATIENT_CLINIC_OR_DEPARTMENT_OTHER): Payer: Medicaid Other

## 2021-12-27 DIAGNOSIS — C187 Malignant neoplasm of sigmoid colon: Secondary | ICD-10-CM | POA: Diagnosis present

## 2021-12-27 DIAGNOSIS — C772 Secondary and unspecified malignant neoplasm of intra-abdominal lymph nodes: Secondary | ICD-10-CM | POA: Insufficient documentation

## 2021-12-27 MED ORDER — IOHEXOL 300 MG/ML  SOLN
100.0000 mL | Freq: Once | INTRAMUSCULAR | Status: AC | PRN
  Administered 2021-12-27: 100 mL via INTRAVENOUS

## 2021-12-30 ENCOUNTER — Encounter: Payer: Self-pay | Admitting: *Deleted

## 2021-12-31 ENCOUNTER — Other Ambulatory Visit: Payer: Self-pay

## 2021-12-31 ENCOUNTER — Inpatient Hospital Stay: Payer: Medicaid Other

## 2021-12-31 ENCOUNTER — Inpatient Hospital Stay: Payer: Medicaid Other | Admitting: Hematology & Oncology

## 2021-12-31 ENCOUNTER — Encounter: Payer: Self-pay | Admitting: Hematology & Oncology

## 2021-12-31 ENCOUNTER — Telehealth: Payer: Self-pay | Admitting: *Deleted

## 2021-12-31 ENCOUNTER — Inpatient Hospital Stay: Payer: Medicaid Other | Attending: Gynecologic Oncology

## 2021-12-31 VITALS — BP 127/63 | HR 85 | Temp 97.9°F | Resp 18 | Ht 59.0 in | Wt 169.0 lb

## 2021-12-31 DIAGNOSIS — C187 Malignant neoplasm of sigmoid colon: Secondary | ICD-10-CM

## 2021-12-31 DIAGNOSIS — Z5111 Encounter for antineoplastic chemotherapy: Secondary | ICD-10-CM | POA: Insufficient documentation

## 2021-12-31 DIAGNOSIS — D510 Vitamin B12 deficiency anemia due to intrinsic factor deficiency: Secondary | ICD-10-CM | POA: Insufficient documentation

## 2021-12-31 DIAGNOSIS — C772 Secondary and unspecified malignant neoplasm of intra-abdominal lymph nodes: Secondary | ICD-10-CM | POA: Diagnosis not present

## 2021-12-31 DIAGNOSIS — Z95828 Presence of other vascular implants and grafts: Secondary | ICD-10-CM

## 2021-12-31 DIAGNOSIS — S31109A Unspecified open wound of abdominal wall, unspecified quadrant without penetration into peritoneal cavity, initial encounter: Secondary | ICD-10-CM

## 2021-12-31 LAB — CBC WITH DIFFERENTIAL (CANCER CENTER ONLY)
Abs Immature Granulocytes: 0.01 10*3/uL (ref 0.00–0.07)
Basophils Absolute: 0.1 10*3/uL (ref 0.0–0.1)
Basophils Relative: 2 %
Eosinophils Absolute: 0.2 10*3/uL (ref 0.0–0.5)
Eosinophils Relative: 6 %
HCT: 34.2 % — ABNORMAL LOW (ref 36.0–46.0)
Hemoglobin: 11.1 g/dL — ABNORMAL LOW (ref 12.0–15.0)
Immature Granulocytes: 0 %
Lymphocytes Relative: 52 %
Lymphs Abs: 1.7 10*3/uL (ref 0.7–4.0)
MCH: 32 pg (ref 26.0–34.0)
MCHC: 32.5 g/dL (ref 30.0–36.0)
MCV: 98.6 fL (ref 80.0–100.0)
Monocytes Absolute: 0.5 10*3/uL (ref 0.1–1.0)
Monocytes Relative: 17 %
Neutro Abs: 0.7 10*3/uL — ABNORMAL LOW (ref 1.7–7.7)
Neutrophils Relative %: 23 %
Platelet Count: 260 10*3/uL (ref 150–400)
RBC: 3.47 MIL/uL — ABNORMAL LOW (ref 3.87–5.11)
RDW: 14.9 % (ref 11.5–15.5)
WBC Count: 3.1 10*3/uL — ABNORMAL LOW (ref 4.0–10.5)
nRBC: 0 % (ref 0.0–0.2)

## 2021-12-31 LAB — CMP (CANCER CENTER ONLY)
ALT: 18 U/L (ref 0–44)
AST: 18 U/L (ref 15–41)
Albumin: 4 g/dL (ref 3.5–5.0)
Alkaline Phosphatase: 37 U/L — ABNORMAL LOW (ref 38–126)
Anion gap: 9 (ref 5–15)
BUN: 20 mg/dL (ref 8–23)
CO2: 26 mmol/L (ref 22–32)
Calcium: 9.3 mg/dL (ref 8.9–10.3)
Chloride: 103 mmol/L (ref 98–111)
Creatinine: 0.67 mg/dL (ref 0.44–1.00)
GFR, Estimated: >60 mL/min (ref >60)
Glucose, Bld: 140 mg/dL — ABNORMAL HIGH (ref 70–99)
Potassium: 3.4 mmol/L — ABNORMAL LOW (ref 3.5–5.1)
Sodium: 138 mmol/L (ref 135–145)
Total Bilirubin: 0.4 mg/dL (ref 0.3–1.2)
Total Protein: 6.2 g/dL — ABNORMAL LOW (ref 6.5–8.1)

## 2021-12-31 LAB — LACTATE DEHYDROGENASE: LDH: 135 U/L (ref 98–192)

## 2021-12-31 LAB — IRON AND IRON BINDING CAPACITY (CC-WL,HP ONLY)
Iron: 84 ug/dL (ref 28–170)
Saturation Ratios: 28 % (ref 10.4–31.8)
TIBC: 300 ug/dL (ref 250–450)
UIBC: 216 ug/dL (ref 148–442)

## 2021-12-31 LAB — FERRITIN: Ferritin: 285 ng/mL (ref 11–307)

## 2021-12-31 LAB — CEA (IN HOUSE-CHCC): CEA (CHCC-In House): 11.68 ng/mL — ABNORMAL HIGH (ref 0.00–5.00)

## 2021-12-31 MED ORDER — HEPARIN SOD (PORK) LOCK FLUSH 100 UNIT/ML IV SOLN
500.0000 [IU] | Freq: Once | INTRAVENOUS | Status: AC
  Administered 2021-12-31: 500 [IU] via INTRAVENOUS

## 2021-12-31 MED ORDER — SODIUM CHLORIDE 0.9% FLUSH
10.0000 mL | Freq: Once | INTRAVENOUS | Status: AC
  Administered 2021-12-31: 10 mL via INTRAVENOUS

## 2021-12-31 NOTE — Telephone Encounter (Signed)
Faxed referral to [place] [facility] to ostomy clinic [phone] ?

## 2021-12-31 NOTE — Progress Notes (Signed)
?Hematology and Oncology Follow Up Visit ? ?Bailey Walker ?[id number] ?08-10-1946 76 y.o. ?12/31/2021 ? ? 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  ?Current Therapy:     ?FOLFOX -- started on 06/11/2020 --  s/p cycle #8 - complete on 10/22/2020 ?Vitamin B12 1000 mcg IM every 3 months  ?FOLFIRI -- s/p cycle #8-- start on 07/31/2021 ?  ?Interim History:  Bailey Walker is here today for follow-up.  She is doing pretty well.  She did have a very nice Easter holiday. ? ?We did do a CT scan on her.  This was done on 12/27/2021.  The CT scan showed everything to be relatively stable.  There may have been a decrease in a couple lymph nodes.  The radiologist did mention some "subtle nodularity" of the mesentery involving the herniated small bowel loop.  This raises the question of worsening peritoneal disease. ? ?She is having problems with her ostomy.  We may need to see about getting her to see an ostomy nurse to try to help out. ? ?Her last CEA level was up a little bit at 12.3. ? ?She has had no nausea or vomiting.  She has had no bleeding.  Patient still has an abdominal wound that is very slow to heal. ? ?She has had no leg swelling.  She has had no fever.  She has had no rashes. ? ?We did do a Doppler of her left leg.  This was done on 12/10/2021.  There is no evidence of thromboembolic disease. ? ?Overall, I would say her performance status is probably ECOG 1.   ? ?Medications:  ?Allergies as of 12/31/2021   ? ?   Reactions  ? Nitroglycerin Nausea And Vomiting  ? Ampicillin Other (See Comments)  ? Per allergy test  ? ?  ? ?  ?Medication List  ?  ? ?  ? Accurate as of December 31, 2021  8:53 AM. If you have any questions, ask your nurse or doctor.  ?  ?  ? ?  ? ?acetaminophen 500 MG tablet ?Commonly known as: TYLENOL ?Take 500 mg by mouth in the morning and at bedtime. ?  ?alendronate 70 MG tablet ?Commonly known  as: FOSAMAX ?Take 70 mg by mouth every Sunday. ?  ?aspirin EC 81 MG tablet ?Take 81 mg by mouth daily. Swallow whole. ?  ?atorvastatin 10 MG tablet ?Commonly known as: LIPITOR ?Take by mouth. ?  ?ciprofloxacin 500 MG tablet ?Commonly known as: Cipro ?Take 1 tablet (500 mg total) by mouth 2 (two) times daily. ?  ?[employer]S Gentle Laxative 5 MG EC tablet ?Generic drug: bisacodyl ?Take 5 mg by mouth daily as needed. ?  ?CVS Purelax 17 GM/SCOOP powder ?Generic drug: polyethylene glycol powder ?Take 17 g by mouth daily as needed. ?  ?dexamethasone 4 MG tablet ?Commonly known as: DECADRON ?Take 2 tablets (8 mg total) by mouth daily. Start the day after chemo for 2 days. ?  ?feeding supplement Liqd ?Take 237 mLs by mouth 3 (three) times daily between meals. ?  ?glipiZIDE 5 MG tablet ?Commonly known as: GLUCOTROL ?TAKE 1 TABLET BY MOUTH TWICE A DAY BEFORE MEALS ?  ?Klor-Con M20 20 MEQ tablet ?Generic drug: potassium chloride SA ?TAKE 1 TABLET BY MOUTH TWICE A DAY ?  ?levothyroxine 75 MCG tablet ?Commonly known as: SYNTHROID ?TAKE 1 TABLET BY MOUTH EVERY DAY ?What changed: when to take this ?  ?loperamide  2 MG capsule ?Commonly known as: IMODIUM ?TAKE 2 CAPSULES (4 MG TOTAL) BY MOUTH AS NEEDED. TAKE 2 AT DIARRHEA ONSET , THEN 1 EVERY 2HR UNTIL 12HRS WITH NO BM. MAY TAKE 2 EVERY 4HRS AT NIGHT. IF DIARRHEA RECURS REPEAT. ?  ?metFORMIN 500 MG tablet ?Commonly known as: GLUCOPHAGE ?Take 0.5 tablets (250 mg total) by mouth 2 (two) times daily with a meal. ?What changed: how much to take ?  ?methocarbamol 500 MG tablet ?Commonly known as: ROBAXIN ?TAKE 1 TABLET BY MOUTH EVERY 12 HOURS AS NEEDED FOR MUSCLE SPASMS. ?  ?metoprolol tartrate 25 MG tablet ?Commonly known as: LOPRESSOR ?Take 1 tablet (25 mg total) by mouth 2 (two) times daily. ?  ?morphine 15 MG 12 hr tablet ?Commonly known as: MS CONTIN ?Take 1 tablet (15 mg total) by mouth every 12 (twelve) hours. ?  ?morphine 15 MG tablet ?Commonly known as: MSIR ?Take 1 tablet (15 mg  total) by mouth every 4 (four) hours as needed for severe pain. ?  ?multivitamin with minerals Tabs tablet ?Take 1 tablet by mouth daily. ?  ?ondansetron 8 MG tablet ?Commonly known as: ZOFRAN ?TAKE 1 TABLET (8 MG TOTAL) BY MOUTH 2 (TWO) TIMES DAILY AS NEEDED FOR REFRACTORY NAUSEA / VOMITING. START ON DAY 3 AFTER CHEMOTHERAPY. ?  ?OVER THE COUNTER MEDICATION ?Take 5 mLs by mouth at bedtime. Mucosolve - used for cough (pt got from [place]) ?  ?pantoprazole 20 MG tablet ?Commonly known as: PROTONIX ?Take 1 tablet (20 mg total) by mouth daily. ?  ?prednisoLONE acetate 1 % ophthalmic suspension ?Commonly known as: PRED FORTE ?Place 1 drop into both eyes every Monday. ?  ?prochlorperazine 10 MG tablet ?Commonly known as: COMPAZINE ?Take 1 tablet (10 mg total) by mouth every 6 (six) hours as needed (NAUSEA). ?  ?silver sulfADIAZINE 1 % cream ?Commonly known as: Silvadene ?Apply 1 application topically daily. ?  ?Vitamin D (Ergocalciferol) 1.25 MG (50000 UNIT) Caps capsule ?Commonly known as: DRISDOL ?Take 50,000 Units by mouth once a week. ?  ? ?  ? ? ?Allergies:  ?Allergies  ?Allergen Reactions  ? Nitroglycerin Nausea And Vomiting  ? Ampicillin Other (See Comments)  ?  Per allergy test  ? ? ?Past Medical History, Surgical history, Social history, and Family History were reviewed and updated. ? ?Review of Systems: ?Review of Systems  ?HENT: Negative.    ?Eyes: Negative.   ?Respiratory:  Positive for shortness of breath.   ?Cardiovascular: Negative.   ?Gastrointestinal:  Positive for abdominal pain.  ?Genitourinary: Negative.   ?Musculoskeletal:  Positive for back pain.  ?Skin: Negative.   ?Neurological: Negative.   ?Endo/Heme/Allergies: Negative.   ?Psychiatric/Behavioral: Negative.    ? ? ?Physical Exam: ? height is '4\' 11"'$  (1.499 m) and weight is 169 lb (76.7 kg). Her oral temperature is 97.9 ?F (36.6 ?C). Her blood pressure is 127/63 and her pulse is 85. Her respiration is 18 and oxygen saturation is 98%.  ? ?Wt  Readings from Last 3 Encounters:  ?12/31/21 169 lb (76.7 kg)  ?12/10/21 169 lb (76.7 kg)  ?11/19/21 164 lb 8 oz (74.6 kg)  ? ? ?Physical Exam ?Vitals reviewed.  ?HENT:  ?   Head: Normocephalic and atraumatic.  ?Eyes:  ?   Pupils: Pupils are equal, round, and reactive to light.  ?Cardiovascular:  ?   Rate and Rhythm: Normal rate and regular rhythm.  ?   Heart sounds: Normal heart sounds.  ?Pulmonary:  ?   Effort: Pulmonary effort is normal.  ?  Breath sounds: Normal breath sounds.  ?Abdominal:  ?   General: Bowel sounds are normal.  ?   Palpations: Abdomen is soft.  ?   Comments: Her abdomen is slightly distended.  There is no obvious fluid wave.  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  She still has a dressing over the abdominal laparotomy scar.  She does not have any obvious erythema or warmth.  There is no exudate coming from the scars.  She has decent bowel sounds.  There is no palpable liver or spleen tip.  ?Musculoskeletal:     ?   General: No tenderness or deformity. Normal range of motion.  ?   Cervical back: Normal range of motion.  ?Lymphadenopathy:  ?   Cervical: No cervical adenopathy.  ?Skin: ?   General: Skin is warm and dry.  ?   Findings: No erythema or rash.  ?Neurological:  ?   Mental Status: She is alert and oriented to person, place, and time.  ?Psychiatric:     ?   Behavior: Behavior normal.     ?   Thought Content: Thought content normal.     ?   Judgment: Judgment normal.  ? ? ? ?Lab Results  ?Component Value Date  ? WBC 3.1 (L) 12/31/2021  ? HGB 11.1 (L) 12/31/2021  ? HCT 34.2 (L) 12/31/2021  ? MCV 98.6 12/31/2021  ? PLT 260 12/31/2021  ? ?Lab Results  ?Component Value Date  ? FERRITIN 378 (H) 11/19/2021  ? IRON 58 11/19/2021  ? TIBC 312 11/19/2021  ? UIBC 254 11/19/2021  ? IRONPCTSAT 19 11/19/2021  ? ?Lab Results  ?Component Value Date  ? RETICCTPCT 3.1 11/19/2021  ? RBC 3.47 (L) 12/31/2021  ? ?No results found for: KPAFRELGTCHN, LAMBDASER, KAPLAMBRATIO ?No results found for: IGGSERUM,  IGA, IGMSERUM ?No results found for: TOTALPROTELP, ALBUMINELP, A1GS, A2GS, BETS, BETA2SER, GAMS, MSPIKE, SPEI ?  Chemistry   ?   ?Component Value Date/Time  ? NA 138 12/10/2021 0915  ? NA 139 07/12/2019 1110  ? K

## 2021-12-31 NOTE — Patient Instructions (Signed)

## 2021-12-31 NOTE — Addendum Note (Signed)
Addended by: Shelda Altes on: 12/31/2021 09:29 AM ? ? Modules accepted: Orders ? ?

## 2022-01-02 ENCOUNTER — Other Ambulatory Visit: Payer: Self-pay | Admitting: Hematology & Oncology

## 2022-01-06 ENCOUNTER — Ambulatory Visit: Payer: Medicaid Other

## 2022-01-06 ENCOUNTER — Other Ambulatory Visit: Payer: Medicaid Other

## 2022-01-07 ENCOUNTER — Inpatient Hospital Stay: Payer: Medicaid Other

## 2022-01-07 DIAGNOSIS — I10 Essential (primary) hypertension: Secondary | ICD-10-CM

## 2022-01-07 DIAGNOSIS — H1812 Bullous keratopathy, left eye: Secondary | ICD-10-CM

## 2022-01-07 DIAGNOSIS — D509 Iron deficiency anemia, unspecified: Secondary | ICD-10-CM

## 2022-01-07 DIAGNOSIS — N3000 Acute cystitis without hematuria: Secondary | ICD-10-CM

## 2022-01-07 DIAGNOSIS — H18453 Nodular corneal degeneration, bilateral: Secondary | ICD-10-CM

## 2022-01-07 DIAGNOSIS — K56609 Unspecified intestinal obstruction, unspecified as to partial versus complete obstruction: Secondary | ICD-10-CM

## 2022-01-07 DIAGNOSIS — R0789 Other chest pain: Secondary | ICD-10-CM

## 2022-01-07 DIAGNOSIS — R112 Nausea with vomiting, unspecified: Secondary | ICD-10-CM

## 2022-01-07 DIAGNOSIS — Z947 Corneal transplant status: Secondary | ICD-10-CM

## 2022-01-07 DIAGNOSIS — R933 Abnormal findings on diagnostic imaging of other parts of digestive tract: Secondary | ICD-10-CM

## 2022-01-07 DIAGNOSIS — R079 Chest pain, unspecified: Secondary | ICD-10-CM

## 2022-01-07 DIAGNOSIS — I471 Supraventricular tachycardia, unspecified: Secondary | ICD-10-CM

## 2022-01-07 DIAGNOSIS — E088 Diabetes mellitus due to underlying condition with unspecified complications: Secondary | ICD-10-CM

## 2022-01-07 DIAGNOSIS — Z961 Presence of intraocular lens: Secondary | ICD-10-CM

## 2022-01-07 DIAGNOSIS — K641 Second degree hemorrhoids: Secondary | ICD-10-CM

## 2022-01-07 DIAGNOSIS — R140 Abdominal distension (gaseous): Secondary | ICD-10-CM

## 2022-01-07 DIAGNOSIS — K2970 Gastritis, unspecified, without bleeding: Secondary | ICD-10-CM

## 2022-01-07 DIAGNOSIS — R0609 Other forms of dyspnea: Secondary | ICD-10-CM

## 2022-01-07 DIAGNOSIS — Z5111 Encounter for antineoplastic chemotherapy: Secondary | ICD-10-CM | POA: Diagnosis not present

## 2022-01-07 DIAGNOSIS — Z87891 Personal history of nicotine dependence: Secondary | ICD-10-CM

## 2022-01-07 DIAGNOSIS — Z789 Other specified health status: Secondary | ICD-10-CM

## 2022-01-07 DIAGNOSIS — C772 Secondary and unspecified malignant neoplasm of intra-abdominal lymph nodes: Secondary | ICD-10-CM

## 2022-01-07 DIAGNOSIS — Z7189 Other specified counseling: Secondary | ICD-10-CM

## 2022-01-07 DIAGNOSIS — E038 Other specified hypothyroidism: Secondary | ICD-10-CM

## 2022-01-07 DIAGNOSIS — D519 Vitamin B12 deficiency anemia, unspecified: Secondary | ICD-10-CM

## 2022-01-07 LAB — CMP (CANCER CENTER ONLY)
ALT: 32 U/L (ref 0–44)
AST: 34 U/L (ref 15–41)
Albumin: 3.8 g/dL (ref 3.5–5.0)
Alkaline Phosphatase: 44 U/L (ref 38–126)
Anion gap: 8 (ref 5–15)
BUN: 17 mg/dL (ref 8–23)
CO2: 22 mmol/L (ref 22–32)
Calcium: 9 mg/dL (ref 8.9–10.3)
Chloride: 106 mmol/L (ref 98–111)
Creatinine: 0.66 mg/dL (ref 0.44–1.00)
GFR, Estimated: >60 mL/min (ref >60)
Glucose, Bld: 165 mg/dL — ABNORMAL HIGH (ref 70–99)
Potassium: 4.2 mmol/L (ref 3.5–5.1)
Sodium: 136 mmol/L (ref 135–145)
Total Bilirubin: 0.4 mg/dL (ref 0.3–1.2)
Total Protein: 7.1 g/dL (ref 6.5–8.1)

## 2022-01-07 LAB — CBC WITH DIFFERENTIAL (CANCER CENTER ONLY)
Abs Immature Granulocytes: 0.07 10*3/uL (ref 0.00–0.07)
Basophils Absolute: 0.1 10*3/uL (ref 0.0–0.1)
Basophils Relative: 1 %
Eosinophils Absolute: 0.1 10*3/uL (ref 0.0–0.5)
Eosinophils Relative: 1 %
HCT: 34.3 % — ABNORMAL LOW (ref 36.0–46.0)
Hemoglobin: 11.2 g/dL — ABNORMAL LOW (ref 12.0–15.0)
Immature Granulocytes: 1 %
Lymphocytes Relative: 23 %
Lymphs Abs: 1.5 10*3/uL (ref 0.7–4.0)
MCH: 32.3 pg (ref 26.0–34.0)
MCHC: 32.7 g/dL (ref 30.0–36.0)
MCV: 98.8 fL (ref 80.0–100.0)
Monocytes Absolute: 0.9 10*3/uL (ref 0.1–1.0)
Monocytes Relative: 14 %
Neutro Abs: 3.9 10*3/uL (ref 1.7–7.7)
Neutrophils Relative %: 60 %
Platelet Count: 206 10*3/uL (ref 150–400)
RBC: 3.47 MIL/uL — ABNORMAL LOW (ref 3.87–5.11)
RDW: 14.5 % (ref 11.5–15.5)
WBC Count: 6.6 10*3/uL (ref 4.0–10.5)
nRBC: 0 % (ref 0.0–0.2)

## 2022-01-07 MED ORDER — SODIUM CHLORIDE 0.9 % IV SOLN: Freq: Once | INTRAVENOUS | Status: AC

## 2022-01-07 MED ORDER — PALONOSETRON HCL INJECTION 0.25 MG/5ML
0.2500 mg | Freq: Once | INTRAVENOUS | Status: AC
  Administered 2022-01-07: 0.25 mg via INTRAVENOUS
  Filled 2022-01-07: qty 5

## 2022-01-07 MED ORDER — SODIUM CHLORIDE 0.9 % IV SOLN
400.0000 mg/m2 | Freq: Once | INTRAVENOUS | Status: AC
  Administered 2022-01-07: 748 mg via INTRAVENOUS
  Filled 2022-01-07: qty 37.4

## 2022-01-07 MED ORDER — SODIUM CHLORIDE 0.9 % IV SOLN
144.0000 mg/m2 | Freq: Once | INTRAVENOUS | Status: AC
  Administered 2022-01-07: 260 mg via INTRAVENOUS
  Filled 2022-01-07: qty 5

## 2022-01-07 MED ORDER — SODIUM CHLORIDE 0.9 % IV SOLN
1920.0000 mg/m2 | INTRAVENOUS | Status: DC
  Administered 2022-01-07: 3600 mg via INTRAVENOUS
  Filled 2022-01-07: qty 72

## 2022-01-07 MED ORDER — SODIUM CHLORIDE 0.9 % IV SOLN
10.0000 mg | Freq: Once | INTRAVENOUS | Status: AC
  Administered 2022-01-07: 10 mg via INTRAVENOUS
  Filled 2022-01-07: qty 10

## 2022-01-07 MED ORDER — FLUOROURACIL CHEMO INJECTION 2.5 GM/50ML
400.0000 mg/m2 | Freq: Once | INTRAVENOUS | Status: AC
  Administered 2022-01-07: 750 mg via INTRAVENOUS
  Filled 2022-01-07: qty 15

## 2022-01-07 MED ORDER — ATROPINE SULFATE 1 MG/ML IV SOLN
0.5000 mg | Freq: Once | INTRAVENOUS | Status: AC | PRN
  Administered 2022-01-07: 0.5 mg via INTRAVENOUS
  Filled 2022-01-07: qty 1

## 2022-01-07 NOTE — Patient Instructions (Signed)
[facility] [place]  Discharge Instructions: ?Thank you for choosing [facility] to provide your oncology and hematology care.  ? ?If you have a lab appointment with the [facility], please go directly to the [facility] and check in at the registration area. ? ?Wear comfortable clothing and clothing appropriate for easy access to any Portacath or PICC line.  ? ?We strive to give you quality time with your provider. You may need to reschedule your appointment if you arrive late (15 or more minutes).  Arriving late affects you and other patients whose appointments are after yours.  Also, if you miss three or more appointments without notifying the office, you may be dismissed from the clinic at the provider?s discretion.    ?  ?For prescription refill requests, have your pharmacy contact our office and allow 72 hours for refills to be completed.   ? ?Today you received the following chemotherapy and/or immunotherapy agents 5-fu, leucovorin, cpt-11  ?  ?To help prevent nausea and vomiting after your treatment, we encourage you to take your nausea medication as directed. ? ?BELOW ARE SYMPTOMS THAT SHOULD BE REPORTED IMMEDIATELY: ?*FEVER GREATER THAN 100.4 F (38 ?C) OR HIGHER ?*CHILLS OR SWEATING ?*NAUSEA AND VOMITING THAT IS NOT CONTROLLED WITH YOUR NAUSEA MEDICATION ?*UNUSUAL SHORTNESS OF BREATH ?*UNUSUAL BRUISING OR BLEEDING ?*URINARY PROBLEMS (pain or burning when urinating, or frequent urination) ?*BOWEL PROBLEMS (unusual diarrhea, constipation, pain near the anus) ?TENDERNESS IN MOUTH AND THROAT WITH OR WITHOUT PRESENCE OF ULCERS (sore throat, sores in mouth, or a toothache) ?UNUSUAL RASH, SWELLING OR PAIN  ?UNUSUAL VAGINAL DISCHARGE OR ITCHING  ? ?Items with * indicate a potential emergency and should be followed up as soon as possible or go to the Emergency Department if any problems should occur. ? ?Please show the CHEMOTHERAPY ALERT CARD or IMMUNOTHERAPY ALERT CARD at  check-in to the Emergency Department and triage nurse. ?Should you have questions after your visit or need to cancel or reschedule your appointment, please contact [facility]  [phone] and follow the prompts.  Office hours are 8:00 a.m. to 4:30 p.m. Monday - Friday. Please note that voicemails left after 4:00 p.m. may not be returned until the following business day.  We are closed weekends and major holidays. You have access to a nurse at all times for urgent questions. Please call the main number to the clinic [phone] and follow the prompts. ? ?For any non-urgent questions, you may also contact your provider using MyChart. We now offer e-Visits for anyone 20 and older to request care online for non-urgent symptoms. For details visit [email or web address]. ?  ?Also download the MyChart app! Go to the app store, search "MyChart", open the app, select [facility], and log in with your MyChart username and password. ? ?Due to Covid, a mask is required upon entering the hospital/clinic. If you do not have a mask, one will be given to you upon arrival. For doctor visits, patients may have 1 support person aged 58 or older with them. For treatment visits, patients cannot have anyone with them due to current Covid guidelines and our immunocompromised population.  ?

## 2022-01-08 ENCOUNTER — Other Ambulatory Visit: Payer: Self-pay | Admitting: *Deleted

## 2022-01-08 DIAGNOSIS — C772 Secondary and unspecified malignant neoplasm of intra-abdominal lymph nodes: Secondary | ICD-10-CM

## 2022-01-08 MED ORDER — DEXAMETHASONE 4 MG PO TABS: 8.0000 mg | ORAL_TABLET | Freq: Every day | ORAL | 5 refills | Status: DC

## 2022-01-09 ENCOUNTER — Inpatient Hospital Stay: Payer: Medicaid Other

## 2022-01-09 VITALS — BP 137/59 | HR 78 | Temp 97.3°F | Resp 16

## 2022-01-09 DIAGNOSIS — C772 Secondary and unspecified malignant neoplasm of intra-abdominal lymph nodes: Secondary | ICD-10-CM

## 2022-01-09 DIAGNOSIS — Z5111 Encounter for antineoplastic chemotherapy: Secondary | ICD-10-CM | POA: Diagnosis not present

## 2022-01-09 MED ORDER — HEPARIN SOD (PORK) LOCK FLUSH 100 UNIT/ML IV SOLN: 500.0000 [IU] | Freq: Once | INTRAVENOUS | Status: DC

## 2022-01-09 MED ORDER — HEPARIN SOD (PORK) LOCK FLUSH 100 UNIT/ML IV SOLN
500.0000 [IU] | Freq: Once | INTRAVENOUS | Status: AC | PRN
  Administered 2022-01-09: 500 [IU]

## 2022-01-09 MED ORDER — SODIUM CHLORIDE 0.9% FLUSH: 10.0000 mL | Freq: Once | INTRAVENOUS | Status: DC

## 2022-01-09 MED ORDER — SODIUM CHLORIDE 0.9% FLUSH
10.0000 mL | INTRAVENOUS | Status: DC | PRN
  Administered 2022-01-09: 10 mL

## 2022-01-10 ENCOUNTER — Other Ambulatory Visit: Payer: Self-pay | Admitting: *Deleted

## 2022-01-10 ENCOUNTER — Encounter (HOSPITAL_COMMUNITY)
Admission: RE | Admit: 2022-01-10 | Discharge: 2022-01-10 | Disposition: A | Discharge status: Home / Self Care | Payer: Medicaid Other | Source: Ambulatory Visit | Attending: Nurse Practitioner | Admitting: Nurse Practitioner

## 2022-01-10 DIAGNOSIS — K435 Parastomal hernia without obstruction or  gangrene: Secondary | ICD-10-CM | POA: Diagnosis not present

## 2022-01-10 DIAGNOSIS — K5900 Constipation, unspecified: Secondary | ICD-10-CM | POA: Insufficient documentation

## 2022-01-10 DIAGNOSIS — Z933 Colostomy status: Secondary | ICD-10-CM | POA: Diagnosis present

## 2022-01-10 DIAGNOSIS — C772 Secondary and unspecified malignant neoplasm of intra-abdominal lymph nodes: Secondary | ICD-10-CM

## 2022-01-10 DIAGNOSIS — K9400 Colostomy complication, unspecified: Secondary | ICD-10-CM | POA: Diagnosis not present

## 2022-01-10 DIAGNOSIS — K432 Incisional hernia without obstruction or gangrene: Secondary | ICD-10-CM | POA: Diagnosis not present

## 2022-01-10 DIAGNOSIS — K9409 Other complications of colostomy: Secondary | ICD-10-CM | POA: Insufficient documentation

## 2022-01-10 DIAGNOSIS — Y833 Surgical operation with formation of external stoma as the cause of abnormal reaction of the patient, or of later complication, without mention of misadventure at the time of the procedure: Secondary | ICD-10-CM | POA: Diagnosis not present

## 2022-01-10 DIAGNOSIS — L24B1 Irritant contact dermatitis related to digestive stoma or fistula: Secondary | ICD-10-CM | POA: Diagnosis not present

## 2022-01-10 MED ORDER — TRAMADOL HCL 50 MG PO TABS: 50.0000 mg | ORAL_TABLET | Freq: Four times a day (QID) | ORAL | 3 refills | Status: DC | PRN

## 2022-01-10 NOTE — Discharge Instructions (Signed)
Switch to Mio Flip and barrier ring ?Email me to update how they work. ?I will update orders with medline if needed.  ?

## 2022-01-10 NOTE — Progress Notes (Signed)
[facility]  ? 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  She performs ostomy care and states pouch is now leaking frequently.  She has taped perimeter of pouch with nonmedical tape.   ?HPI:  ?Cancer sigmoid colon with end colostomy  Rounded abdomen and midline abdominal incision, nonhealing.  ?ROS  ?Review of Systems  ?Constitutional: Negative.   ?Gastrointestinal:  Positive for abdominal distention and constipation.  ?     LLQ colostomy ?Parastomal hernia ?Midline abdominal wound  ?Skin:  Positive for rash.  ?     Medical adhesive related skin injury to pouching perimeter.  Erythema only, skin intact  ?All other systems reviewed and are negative. ?Vital signs:  ?BP 135/71 (BP Location: Right Arm)   Pulse 75   Temp 97.6 ?F (36.4 ?C) (Oral)   Resp 18   SpO2 99%  ?Exam:  ?Physical Exam ?Vitals reviewed.  ?Constitutional:   ?   Appearance: She is obese.  ?Abdominal:  ?   Palpations: Abdomen is soft.  ?   Hernia: A hernia is present.  ?   Comments: Sluggish bowel sounds, hard stool  ?Skin: ?   General: Skin is warm and dry.  ?Neurological:  ?   Mental Status: She is alert.  ?Psychiatric:     ?   Mood and Affect: Mood normal.  ?  ?Stoma type/location:  LLQ colostomy, very slightly budded with large parastomal hernia present ?Stomal assessment/size:  1 5/8" pink and moist  producing thick balls of stool.  Patient has been taking Miralax PRN.  I have recommended she take this daily to avoid becoming constipated.  She can always skip a couple of days if stool becomes too loose.   ?Peristomal assessment:  Skin is intact.  Slight erythema at periphery of pouching area from adhesive used to reinforce pouch.  I am going to switch to barrier strips instead of tape. This should adhere and be more gentle to skin.  ?Large parastomal hernia present, rounded area.  Will use Mio flip pouch designed for rounded abdomen.  I apply one today and send home with 2 more.  Niece is shown how to apply.  ?Treatment  options for stomal/peristomal skin: skin is intact, hernia present.  We will start with Mio flip, she does not want to wear an ostomy belt, but may consider a binder if this pouch is not effective.  ?Output: Thick brown balls of stool.  I explore the stoma digitally and remove a few balls and some soft stool exits after that.  I do not think she needs irrigation today.  She is to begin Miralax daily.  ?Ostomy pouching: 1pc. Coloplast Mio flip with barrier ring ?Education provided:  See above  pouch change performed and samples sent home to try at next change.  Will switch to this and order through Medline if they like it.  Niece will email me.  ? ?  ?Impression/dx  ?Colostomy complication ?Parastomal hernia ?Discussion  ?Rationale for pouch change due to hernia, leaking ?Email regarding new pouch ?Plan  ?Email me Monday.  I will update orders with Medline ? ? ? ?Visit time: 55 minutes.  ? ?Domenic Moras FNP-BC ? ?  ?

## 2022-01-17 ENCOUNTER — Other Ambulatory Visit: Payer: Self-pay | Admitting: Hematology & Oncology

## 2022-01-26 ENCOUNTER — Other Ambulatory Visit: Payer: Self-pay | Admitting: Hematology & Oncology

## 2022-01-26 DIAGNOSIS — C772 Secondary and unspecified malignant neoplasm of intra-abdominal lymph nodes: Secondary | ICD-10-CM

## 2022-01-27 ENCOUNTER — Encounter: Payer: Self-pay | Admitting: Hematology and Oncology

## 2022-01-27 ENCOUNTER — Encounter: Payer: Self-pay | Admitting: Hematology & Oncology

## 2022-02-03 ENCOUNTER — Other Ambulatory Visit: Payer: Self-pay | Admitting: Hematology & Oncology

## 2022-02-03 ENCOUNTER — Inpatient Hospital Stay (HOSPITAL_BASED_OUTPATIENT_CLINIC_OR_DEPARTMENT_OTHER): Payer: Medicaid Other | Admitting: Hematology & Oncology

## 2022-02-03 ENCOUNTER — Other Ambulatory Visit: Payer: Self-pay

## 2022-02-03 ENCOUNTER — Inpatient Hospital Stay: Payer: Medicaid Other | Attending: Gynecologic Oncology

## 2022-02-03 ENCOUNTER — Encounter: Payer: Self-pay | Admitting: Hematology & Oncology

## 2022-02-03 ENCOUNTER — Inpatient Hospital Stay: Payer: Medicaid Other

## 2022-02-03 VITALS — BP 112/70 | HR 76 | Temp 97.9°F | Resp 18 | Wt 169.0 lb

## 2022-02-03 DIAGNOSIS — C187 Malignant neoplasm of sigmoid colon: Secondary | ICD-10-CM

## 2022-02-03 DIAGNOSIS — Z5189 Encounter for other specified aftercare: Secondary | ICD-10-CM | POA: Insufficient documentation

## 2022-02-03 DIAGNOSIS — K2970 Gastritis, unspecified, without bleeding: Secondary | ICD-10-CM

## 2022-02-03 DIAGNOSIS — C772 Secondary and unspecified malignant neoplasm of intra-abdominal lymph nodes: Secondary | ICD-10-CM

## 2022-02-03 DIAGNOSIS — Z5111 Encounter for antineoplastic chemotherapy: Secondary | ICD-10-CM | POA: Insufficient documentation

## 2022-02-03 DIAGNOSIS — D500 Iron deficiency anemia secondary to blood loss (chronic): Secondary | ICD-10-CM | POA: Diagnosis not present

## 2022-02-03 LAB — CBC WITH DIFFERENTIAL (CANCER CENTER ONLY)
Abs Immature Granulocytes: 0.12 10*3/uL — ABNORMAL HIGH (ref 0.00–0.07)
Basophils Absolute: 0.1 10*3/uL (ref 0.0–0.1)
Basophils Relative: 1 %
Eosinophils Absolute: 0.3 10*3/uL (ref 0.0–0.5)
Eosinophils Relative: 4 %
HCT: 34.7 % — ABNORMAL LOW (ref 36.0–46.0)
Hemoglobin: 11.1 g/dL — ABNORMAL LOW (ref 12.0–15.0)
Immature Granulocytes: 2 %
Lymphocytes Relative: 28 %
Lymphs Abs: 1.9 10*3/uL (ref 0.7–4.0)
MCH: 31.6 pg (ref 26.0–34.0)
MCHC: 32 g/dL (ref 30.0–36.0)
MCV: 98.9 fL (ref 80.0–100.0)
Monocytes Absolute: 0.9 10*3/uL (ref 0.1–1.0)
Monocytes Relative: 13 %
Neutro Abs: 3.5 10*3/uL (ref 1.7–7.7)
Neutrophils Relative %: 52 %
Platelet Count: 215 10*3/uL (ref 150–400)
RBC: 3.51 MIL/uL — ABNORMAL LOW (ref 3.87–5.11)
RDW: 14.4 % (ref 11.5–15.5)
WBC Count: 6.8 10*3/uL (ref 4.0–10.5)
nRBC: 0 % (ref 0.0–0.2)

## 2022-02-03 LAB — CMP (CANCER CENTER ONLY)
ALT: 26 U/L (ref 0–44)
AST: 23 U/L (ref 15–41)
Albumin: 4.2 g/dL (ref 3.5–5.0)
Alkaline Phosphatase: 43 U/L (ref 38–126)
Anion gap: 10 (ref 5–15)
BUN: 13 mg/dL (ref 8–23)
CO2: 24 mmol/L (ref 22–32)
Calcium: 9.1 mg/dL (ref 8.9–10.3)
Chloride: 104 mmol/L (ref 98–111)
Creatinine: 0.68 mg/dL (ref 0.44–1.00)
GFR, Estimated: >60 mL/min (ref >60)
Glucose, Bld: 162 mg/dL — ABNORMAL HIGH (ref 70–99)
Potassium: 3.9 mmol/L (ref 3.5–5.1)
Sodium: 138 mmol/L (ref 135–145)
Total Bilirubin: 0.4 mg/dL (ref 0.3–1.2)
Total Protein: 6.4 g/dL — ABNORMAL LOW (ref 6.5–8.1)

## 2022-02-03 LAB — CEA (IN HOUSE-CHCC): CEA (CHCC-In House): 11.81 ng/mL — ABNORMAL HIGH (ref 0.00–5.00)

## 2022-02-03 MED ORDER — SODIUM CHLORIDE 0.9 % IV SOLN
144.0000 mg/m2 | Freq: Once | INTRAVENOUS | Status: AC
  Administered 2022-02-03: 260 mg via INTRAVENOUS
  Filled 2022-02-03: qty 5

## 2022-02-03 MED ORDER — SODIUM CHLORIDE 0.9 % IV SOLN: Freq: Once | INTRAVENOUS | Status: DC

## 2022-02-03 MED ORDER — SODIUM CHLORIDE 0.9% FLUSH: 10.0000 mL | INTRAVENOUS | Status: DC | PRN

## 2022-02-03 MED ORDER — SODIUM CHLORIDE 0.9 % IV SOLN: Freq: Once | INTRAVENOUS | Status: AC

## 2022-02-03 MED ORDER — HEPARIN SOD (PORK) LOCK FLUSH 100 UNIT/ML IV SOLN: 500.0000 [IU] | Freq: Once | INTRAVENOUS | Status: DC | PRN

## 2022-02-03 MED ORDER — SODIUM CHLORIDE 0.9 % IV SOLN
1920.0000 mg/m2 | INTRAVENOUS | Status: DC
  Administered 2022-02-03: 3600 mg via INTRAVENOUS
  Filled 2022-02-03: qty 72

## 2022-02-03 MED ORDER — FLUOROURACIL CHEMO INJECTION 2.5 GM/50ML
400.0000 mg/m2 | Freq: Once | INTRAVENOUS | Status: AC
  Administered 2022-02-03: 750 mg via INTRAVENOUS
  Filled 2022-02-03: qty 15

## 2022-02-03 MED ORDER — PANTOPRAZOLE SODIUM 20 MG PO TBEC: 20.0000 mg | DELAYED_RELEASE_TABLET | Freq: Every day | ORAL | 3 refills | Status: DC

## 2022-02-03 MED ORDER — SODIUM CHLORIDE 0.9 % IV SOLN
400.0000 mg/m2 | Freq: Once | INTRAVENOUS | Status: AC
  Administered 2022-02-03: 748 mg via INTRAVENOUS
  Filled 2022-02-03: qty 37.4

## 2022-02-03 MED ORDER — PALONOSETRON HCL INJECTION 0.25 MG/5ML
0.2500 mg | Freq: Once | INTRAVENOUS | Status: AC
  Administered 2022-02-03: 0.25 mg via INTRAVENOUS
  Filled 2022-02-03: qty 5

## 2022-02-03 MED ORDER — ATROPINE SULFATE 1 MG/ML IV SOLN
0.5000 mg | Freq: Once | INTRAVENOUS | Status: AC | PRN
  Administered 2022-02-03: 0.5 mg via INTRAVENOUS
  Filled 2022-02-03: qty 1

## 2022-02-03 MED ORDER — SODIUM CHLORIDE 0.9 % IV SOLN
10.0000 mg | Freq: Once | INTRAVENOUS | Status: AC
  Administered 2022-02-03: 10 mg via INTRAVENOUS
  Filled 2022-02-03: qty 10

## 2022-02-03 NOTE — Progress Notes (Signed)
?Hematology and Oncology Follow Up Visit ? ?Bailey Walker ?[id number] ?[birth year] 76 y.o. ?02/03/2022 ? ? 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  ?Current Therapy:     ?FOLFOX -- started on 06/11/2020 --  s/p cycle #8 - complete on 10/22/2020 ?Vitamin B12 1000 mcg IM every 3 months  ?FOLFIRI -- s/p cycle #9 -- start on 07/31/2021 ?Neulasta 6 mg sq post chemo -- start on 02/06/2022 ?  ?Interim History:  Bailey Walker is here today for follow-up.  It has been a few weeks since her last saw her.  She been having problems with leukopenia.  I think would have to start her on Neulasta.  I think this would be the best way to try to keep her on schedule since she is doing well..  She has a new ostomy bag.  This is helped a little bit with the abdominal pain. ? ?Her last CEA level was still dropping.  It was 11.2. ? ?She has had no cough or shortness of breath.  She has had no bleeding.  The ostomy is working well.  She is having no issues with her urine. ? ?She is still has his lower back discomfort which is chronic. ? ?There is no problems with leg swelling.  She has had no rashes. ? ?Overall, I would have to say that her performance status is probably ECOG 1.   ? ?Medications:  ?Allergies as of 02/03/2022   ? ?   Reactions  ? Nitroglycerin Nausea And Vomiting  ? Ampicillin Other (See Comments)  ? Per allergy test  ? ?  ? ?  ?Medication List  ?  ? ?  ? Accurate as of Feb 03, 2022 10:24 AM. If you have any questions, ask your nurse or doctor.  ?  ?  ? ?  ? ?acetaminophen 500 MG tablet ?Commonly known as: TYLENOL ?Take 500 mg by mouth in the morning and at bedtime. ?  ?alendronate 70 MG tablet ?Commonly known as: FOSAMAX ?Take 70 mg by mouth every Sunday. ?  ?aspirin EC 81 MG tablet ?Take 81 mg by mouth daily. Swallow whole. ?  ?atorvastatin 10 MG tablet ?Commonly known as: LIPITOR ?Take by mouth. ?  ?ciprofloxacin  500 MG tablet ?Commonly known as: Cipro ?Take 1 tablet (500 mg total) by mouth 2 (two) times daily. ?  ?CVS Gentle Laxative 5 MG EC tablet ?Generic drug: bisacodyl ?Take 5 mg by mouth daily as needed. ?  ?CVS Purelax 17 GM/SCOOP powder ?Generic drug: polyethylene glycol powder ?Take 17 g by mouth daily as needed. ?  ?dexamethasone 4 MG tablet ?Commonly known as: DECADRON ?Take 2 tablets (8 mg total) by mouth daily. Start the day after chemo for 2 days. ?  ?feeding supplement Liqd ?Take 237 mLs by mouth 3 (three) times daily between meals. ?  ?glipiZIDE 5 MG tablet ?Commonly known as: GLUCOTROL ?TAKE 1 TABLET BY MOUTH TWICE A DAY BEFORE MEALS ?  ?Klor-Con M20 20 MEQ tablet ?Generic drug: potassium chloride SA ?TAKE 1 TABLET BY MOUTH TWICE A DAY ?  ?levothyroxine 75 MCG tablet ?Commonly known as: SYNTHROID ?TAKE 1 TABLET BY MOUTH EVERY DAY ?What changed: when to take this ?  ?loperamide 2 MG capsule ?Commonly known as: IMODIUM ?TAKE 2 CAPSULES (4 MG TOTAL) BY MOUTH AS NEEDED. TAKE 2 AT DIARRHEA ONSET , THEN 1 EVERY 2HR UNTIL 12HRS WITH NO BM. MAY TAKE 2 EVERY 4HRS  AT NIGHT. IF DIARRHEA RECURS REPEAT. ?  ?metFORMIN 500 MG tablet ?Commonly known as: GLUCOPHAGE ?Take 0.5 tablets (250 mg total) by mouth 2 (two) times daily with a meal. ?What changed: how much to take ?  ?methocarbamol 500 MG tablet ?Commonly known as: ROBAXIN ?TAKE 1 TABLET BY MOUTH EVERY 12 HOURS AS NEEDED FOR MUSCLE SPASMS. ?  ?metoprolol tartrate 25 MG tablet ?Commonly known as: LOPRESSOR ?Take 1 tablet (25 mg total) by mouth 2 (two) times daily. ?  ?morphine 15 MG 12 hr tablet ?Commonly known as: MS CONTIN ?Take 1 tablet (15 mg total) by mouth every 12 (twelve) hours. ?  ?morphine 15 MG tablet ?Commonly known as: MSIR ?Take 1 tablet (15 mg total) by mouth every 4 (four) hours as needed for severe pain. ?  ?multivitamin with minerals Tabs tablet ?Take 1 tablet by mouth daily. ?  ?ondansetron 8 MG tablet ?Commonly known as: ZOFRAN ?TAKE 1 TABLET (8 MG  TOTAL) BY MOUTH 2 (TWO) TIMES DAILY AS NEEDED FOR REFRACTORY NAUSEA / VOMITING. START ON DAY 3 AFTER CHEMOTHERAPY. ?  ?OVER THE COUNTER MEDICATION ?Take 5 mLs by mouth at bedtime. Mucosolve - used for cough (pt got from [place]) ?  ?pantoprazole 20 MG tablet ?Commonly known as: PROTONIX ?Take 1 tablet (20 mg total) by mouth daily. ?  ?prednisoLONE acetate 1 % ophthalmic suspension ?Commonly known as: PRED FORTE ?Place 1 drop into both eyes every Monday. ?  ?prochlorperazine 10 MG tablet ?Commonly known as: COMPAZINE ?Take 1 tablet (10 mg total) by mouth every 6 (six) hours as needed (NAUSEA). ?  ?silver sulfADIAZINE 1 % cream ?Commonly known as: Silvadene ?Apply 1 application topically daily. ?  ?traMADol 50 MG tablet ?Commonly known as: ULTRAM ?Take 1 tablet (50 mg total) by mouth every 6 (six) hours as needed. ?  ?Vitamin D (Ergocalciferol) 1.25 MG (50000 UNIT) Caps capsule ?Commonly known as: DRISDOL ?Take 50,000 Units by mouth once a week. ?  ? ?  ? ? ?Allergies:  ?Allergies  ?Allergen Reactions  ? Nitroglycerin Nausea And Vomiting  ? Ampicillin Other (See Comments)  ?  Per allergy test  ? ? ?Past Medical History, Surgical history, Social history, and Family History were reviewed and updated. ? ?Review of Systems: ?Review of Systems  ?HENT: Negative.    ?Eyes: Negative.   ?Respiratory:  Positive for shortness of breath.   ?Cardiovascular: Negative.   ?Gastrointestinal:  Positive for abdominal pain.  ?Genitourinary: Negative.   ?Musculoskeletal:  Positive for back pain.  ?Skin: Negative.   ?Neurological: Negative.   ?Endo/Heme/Allergies: Negative.   ?Psychiatric/Behavioral: Negative.    ? ? ?Physical Exam: ? weight is 169 lb (76.7 kg). Her oral temperature is 97.9 ?F (36.6 ?C). Her blood pressure is 112/70 and her pulse is 76. Her respiration is 18 and oxygen saturation is 99%.  ? ?Wt Readings from Last 3 Encounters:  ?02/03/22 169 lb (76.7 kg)  ?01/07/22 167 lb 12 oz (76.1 kg)  ?12/31/21 169 lb (76.7 kg)   ? ? ?Physical Exam ?Vitals reviewed.  ?HENT:  ?   Head: Normocephalic and atraumatic.  ?Eyes:  ?   Pupils: Pupils are equal, round, and reactive to light.  ?Cardiovascular:  ?   Rate and Rhythm: Normal rate and regular rhythm.  ?   Heart sounds: Normal heart sounds.  ?Pulmonary:  ?   Effort: Pulmonary effort is normal.  ?   Breath sounds: Normal breath sounds.  ?Abdominal:  ?   General: Bowel sounds are normal.  ?  Palpations: Abdomen is soft.  ?   Comments: Her abdomen is slightly distended.  There is no obvious fluid wave.  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  She still has a dressing over the abdominal laparotomy scar.  She does not have any obvious erythema or warmth.  There is no exudate coming from the scars.  She has decent bowel sounds.  There is no palpable liver or spleen tip.  ?Musculoskeletal:     ?   General: No tenderness or deformity. Normal range of motion.  ?   Cervical back: Normal range of motion.  ?Lymphadenopathy:  ?   Cervical: No cervical adenopathy.  ?Skin: ?   General: Skin is warm and dry.  ?   Findings: No erythema or rash.  ?Neurological:  ?   Mental Status: She is alert and oriented to person, place, and time.  ?Psychiatric:     ?   Behavior: Behavior normal.     ?   Thought Content: Thought content normal.     ?   Judgment: Judgment normal.  ? ? ? ?Lab Results  ?Component Value Date  ? WBC 6.8 02/03/2022  ? HGB 11.1 (L) 02/03/2022  ? HCT 34.7 (L) 02/03/2022  ? MCV 98.9 02/03/2022  ? PLT 215 02/03/2022  ? ?Lab Results  ?Component Value Date  ? FERRITIN 285 12/31/2021  ? IRON 84 12/31/2021  ? TIBC 300 12/31/2021  ? UIBC 216 12/31/2021  ? IRONPCTSAT 28 12/31/2021  ? ?Lab Results  ?Component Value Date  ? RETICCTPCT 3.1 11/19/2021  ? RBC 3.51 (L) 02/03/2022  ? ?No results found for: KPAFRELGTCHN, LAMBDASER, KAPLAMBRATIO ?No results found for: IGGSERUM, IGA, IGMSERUM ?No results found for: TOTALPROTELP, ALBUMINELP, A1GS, A2GS, BETS, BETA2SER, GAMS, MSPIKE, SPEI ?  Chemistry   ?    ?Component Value Date/Time  ? NA 138 02/03/2022 0915  ? NA 139 07/12/2019 1110  ? K 3.9 02/03/2022 0915  ? CL 104 02/03/2022 0915  ? CO2 24 02/03/2022 0915  ? BUN 13 02/03/2022 0915  ? BUN 15 07/12/2019 1110

## 2022-02-03 NOTE — Patient Instructions (Signed)
[facility] [place]  Discharge Instructions: ?Thank you for choosing [facility] to provide your oncology and hematology care.  ? ?If you have a lab appointment with the [facility], please go directly to the [facility] and check in at the registration area. ? ?Wear comfortable clothing and clothing appropriate for easy access to any Portacath or PICC line.  ? ?We strive to give you quality time with your provider. You may need to reschedule your appointment if you arrive late (15 or more minutes).  Arriving late affects you and other patients whose appointments are after yours.  Also, if you miss three or more appointments without notifying the office, you may be dismissed from the clinic at the provider?s discretion.    ?  ?For prescription refill requests, have your pharmacy contact our office and allow 72 hours for refills to be completed.   ? ?Today you received the following chemotherapy and/or immunotherapy agents irinotecan, leucovorin, fluorouracil.    ?  ?To help prevent nausea and vomiting after your treatment, we encourage you to take your nausea medication as directed. ? ?BELOW ARE SYMPTOMS THAT SHOULD BE REPORTED IMMEDIATELY: ?*FEVER GREATER THAN 100.4 F (38 ?C) OR HIGHER ?*CHILLS OR SWEATING ?*NAUSEA AND VOMITING THAT IS NOT CONTROLLED WITH YOUR NAUSEA MEDICATION ?*UNUSUAL SHORTNESS OF BREATH ?*UNUSUAL BRUISING OR BLEEDING ?*URINARY PROBLEMS (pain or burning when urinating, or frequent urination) ?*BOWEL PROBLEMS (unusual diarrhea, constipation, pain near the anus) ?TENDERNESS IN MOUTH AND THROAT WITH OR WITHOUT PRESENCE OF ULCERS (sore throat, sores in mouth, or a toothache) ?UNUSUAL RASH, SWELLING OR PAIN  ?UNUSUAL VAGINAL DISCHARGE OR ITCHING  ? ?Items with * indicate a potential emergency and should be followed up as soon as possible or go to the Emergency Department if any problems should occur. ? ?Please show the CHEMOTHERAPY ALERT CARD or IMMUNOTHERAPY  ALERT CARD at check-in to the Emergency Department and triage nurse. ?Should you have questions after your visit or need to cancel or reschedule your appointment, please contact [facility]  [phone] and follow the prompts.  Office hours are 8:00 a.m. to 4:30 p.m. Monday - Friday. Please note that voicemails left after 4:00 p.m. may not be returned until the following business day.  We are closed weekends and major holidays. You have access to a nurse at all times for urgent questions. Please call the main number to the clinic [phone] and follow the prompts. ? ?For any non-urgent questions, you may also contact your provider using MyChart. We now offer e-Visits for anyone 36 and older to request care online for non-urgent symptoms. For details visit [email or web address]. ?  ?Also download the MyChart app! Go to the app store, search "MyChart", open the app, select [facility], and log in with your MyChart username and password. ? ?Due to Covid, a mask is required upon entering the hospital/clinic. If you do not have a mask, one will be given to you upon arrival. For doctor visits, patients may have 1 support person aged 46 or older with them. For treatment visits, patients cannot have anyone with them due to current Covid guidelines and our immunocompromised population.  ?

## 2022-02-03 NOTE — Patient Instructions (Signed)

## 2022-02-05 ENCOUNTER — Inpatient Hospital Stay: Payer: Medicaid Other

## 2022-02-05 ENCOUNTER — Telehealth: Payer: Self-pay | Admitting: *Deleted

## 2022-02-05 DIAGNOSIS — Z5111 Encounter for antineoplastic chemotherapy: Secondary | ICD-10-CM | POA: Diagnosis not present

## 2022-02-05 NOTE — Telephone Encounter (Signed)
Per 02/03/22 los - gave upcoming appointments - confirmed ?

## 2022-02-06 ENCOUNTER — Inpatient Hospital Stay: Payer: Medicaid Other

## 2022-02-06 VITALS — BP 152/71 | HR 72 | Temp 98.2°F | Resp 18

## 2022-02-06 DIAGNOSIS — Z5111 Encounter for antineoplastic chemotherapy: Secondary | ICD-10-CM | POA: Diagnosis not present

## 2022-02-06 DIAGNOSIS — D509 Iron deficiency anemia, unspecified: Secondary | ICD-10-CM

## 2022-02-06 MED ORDER — PEGFILGRASTIM-CBQV 6 MG/0.6ML ~~LOC~~ SOSY
6.0000 mg | PREFILLED_SYRINGE | Freq: Once | SUBCUTANEOUS | Status: AC
  Administered 2022-02-06: 6 mg via SUBCUTANEOUS
  Filled 2022-02-06: qty 0.6

## 2022-02-06 NOTE — Patient Instructions (Signed)
[facility] [place]  Discharge Instructions: Thank you for choosing [facility] to provide your oncology and hematology care.   If you have a lab appointment with the [facility], please go directly to the [facility] and check in at the registration area.  Wear comfortable clothing and clothing appropriate for easy access to any Portacath or PICC line.   We strive to give you quality time with your provider. You may need to reschedule your appointment if you arrive late (15 or more minutes).  Arriving late affects you and other patients whose appointments are after yours.  Also, if you miss three or more appointments without notifying the office, you may be dismissed from the clinic at the provider's discretion.      For prescription refill requests, have your pharmacy contact our office and allow 72 hours for refills to be completed.    Today you received the following chemotherapy and/or immunotherapy agents Udenyca.     To help prevent nausea and vomiting after your treatment, we encourage you to take your nausea medication as directed.  BELOW ARE SYMPTOMS THAT SHOULD BE REPORTED IMMEDIATELY: *FEVER GREATER THAN 100.4 F (38 C) OR HIGHER *CHILLS OR SWEATING *NAUSEA AND VOMITING THAT IS NOT CONTROLLED WITH YOUR NAUSEA MEDICATION *UNUSUAL SHORTNESS OF BREATH *UNUSUAL BRUISING OR BLEEDING *URINARY PROBLEMS (pain or burning when urinating, or frequent urination) *BOWEL PROBLEMS (unusual diarrhea, constipation, pain near the anus) TENDERNESS IN MOUTH AND THROAT WITH OR WITHOUT PRESENCE OF ULCERS (sore throat, sores in mouth, or a toothache) UNUSUAL RASH, SWELLING OR PAIN  UNUSUAL VAGINAL DISCHARGE OR ITCHING   Items with * indicate a potential emergency and should be followed up as soon as possible or go to the Emergency Department if any problems should occur.  Please show the CHEMOTHERAPY ALERT CARD or IMMUNOTHERAPY ALERT CARD at check-in to the  Emergency Department and triage nurse. Should you have questions after your visit or need to cancel or reschedule your appointment, please contact [facility]  [phone] and follow the prompts.  Office hours are 8:00 a.m. to 4:30 p.m. Monday - Friday. Please note that voicemails left after 4:00 p.m. may not be returned until the following business day.  We are closed weekends and major holidays. You have access to a nurse at all times for urgent questions. Please call the main number to the clinic [phone] and follow the prompts.  For any non-urgent questions, you may also contact your provider using MyChart. We now offer e-Visits for anyone 76 and older to request care online for non-urgent symptoms. For details visit [email or web address].   Also download the MyChart app! Go to the app store, search "MyChart", open the app, select [facility], and log in with your MyChart username and password.  Due to Covid, a mask is required upon entering the hospital/clinic. If you do not have a mask, one will be given to you upon arrival. For doctor visits, patients may have 1 support person aged 51 or older with them. For treatment visits, patients cannot have anyone with them due to current Covid guidelines and our immunocompromised population.

## 2022-02-07 ENCOUNTER — Other Ambulatory Visit: Payer: Self-pay | Admitting: *Deleted

## 2022-02-07 DIAGNOSIS — C187 Malignant neoplasm of sigmoid colon: Secondary | ICD-10-CM

## 2022-02-07 MED ORDER — TRAMADOL HCL 50 MG PO TABS: 50.0000 mg | ORAL_TABLET | Freq: Four times a day (QID) | ORAL | 3 refills | Status: DC | PRN

## 2022-02-10 ENCOUNTER — Other Ambulatory Visit: Payer: Self-pay

## 2022-02-10 ENCOUNTER — Other Ambulatory Visit: Payer: Self-pay | Admitting: Hematology & Oncology

## 2022-02-10 DIAGNOSIS — C772 Secondary and unspecified malignant neoplasm of intra-abdominal lymph nodes: Secondary | ICD-10-CM

## 2022-02-10 DIAGNOSIS — K2970 Gastritis, unspecified, without bleeding: Secondary | ICD-10-CM

## 2022-02-10 MED ORDER — PANTOPRAZOLE SODIUM 20 MG PO TBEC: 20.0000 mg | DELAYED_RELEASE_TABLET | Freq: Every day | ORAL | 3 refills | Status: DC

## 2022-02-18 ENCOUNTER — Other Ambulatory Visit: Payer: Self-pay | Admitting: Hematology & Oncology

## 2022-02-24 ENCOUNTER — Encounter: Payer: Self-pay | Admitting: Hematology & Oncology

## 2022-02-24 ENCOUNTER — Inpatient Hospital Stay: Payer: Medicaid Other

## 2022-02-24 ENCOUNTER — Inpatient Hospital Stay: Payer: Medicaid Other | Attending: Gynecologic Oncology

## 2022-02-24 ENCOUNTER — Inpatient Hospital Stay (HOSPITAL_BASED_OUTPATIENT_CLINIC_OR_DEPARTMENT_OTHER): Payer: Medicaid Other | Admitting: Hematology & Oncology

## 2022-02-24 VITALS — BP 117/52 | HR 80 | Temp 98.1°F | Resp 18 | Wt 168.0 lb

## 2022-02-24 VITALS — BP 117/52 | HR 80 | Temp 98.2°F | Resp 18 | Wt 168.1 lb

## 2022-02-24 DIAGNOSIS — D510 Vitamin B12 deficiency anemia due to intrinsic factor deficiency: Secondary | ICD-10-CM | POA: Insufficient documentation

## 2022-02-24 DIAGNOSIS — D500 Iron deficiency anemia secondary to blood loss (chronic): Secondary | ICD-10-CM

## 2022-02-24 DIAGNOSIS — Z5111 Encounter for antineoplastic chemotherapy: Secondary | ICD-10-CM | POA: Insufficient documentation

## 2022-02-24 DIAGNOSIS — Z95828 Presence of other vascular implants and grafts: Secondary | ICD-10-CM

## 2022-02-24 DIAGNOSIS — C772 Secondary and unspecified malignant neoplasm of intra-abdominal lymph nodes: Secondary | ICD-10-CM

## 2022-02-24 DIAGNOSIS — C187 Malignant neoplasm of sigmoid colon: Secondary | ICD-10-CM

## 2022-02-24 DIAGNOSIS — Z5189 Encounter for other specified aftercare: Secondary | ICD-10-CM | POA: Insufficient documentation

## 2022-02-24 LAB — FERRITIN: Ferritin: 382 ng/mL — ABNORMAL HIGH (ref 11–307)

## 2022-02-24 LAB — CMP (CANCER CENTER ONLY)
ALT: 32 U/L (ref 0–44)
AST: 25 U/L (ref 15–41)
Albumin: 4.2 g/dL (ref 3.5–5.0)
Alkaline Phosphatase: 63 U/L (ref 38–126)
Anion gap: 11 (ref 5–15)
BUN: 17 mg/dL (ref 8–23)
CO2: 23 mmol/L (ref 22–32)
Calcium: 9.4 mg/dL (ref 8.9–10.3)
Chloride: 102 mmol/L (ref 98–111)
Creatinine: 0.73 mg/dL (ref 0.44–1.00)
GFR, Estimated: >60 mL/min (ref >60)
Glucose, Bld: 202 mg/dL — ABNORMAL HIGH (ref 70–99)
Potassium: 3.9 mmol/L (ref 3.5–5.1)
Sodium: 136 mmol/L (ref 135–145)
Total Bilirubin: 0.4 mg/dL (ref 0.3–1.2)
Total Protein: 6.7 g/dL (ref 6.5–8.1)

## 2022-02-24 LAB — CEA (IN HOUSE-CHCC): CEA (CHCC-In House): 12.24 ng/mL — ABNORMAL HIGH (ref 0.00–5.00)

## 2022-02-24 LAB — CBC WITH DIFFERENTIAL (CANCER CENTER ONLY)
Abs Immature Granulocytes: 0.11 10*3/uL — ABNORMAL HIGH (ref 0.00–0.07)
Basophils Absolute: 0.1 10*3/uL (ref 0.0–0.1)
Basophils Relative: 1 %
Eosinophils Absolute: 0.1 10*3/uL (ref 0.0–0.5)
Eosinophils Relative: 1 %
HCT: 34.6 % — ABNORMAL LOW (ref 36.0–46.0)
Hemoglobin: 11.1 g/dL — ABNORMAL LOW (ref 12.0–15.0)
Immature Granulocytes: 1 %
Lymphocytes Relative: 21 %
Lymphs Abs: 2.2 10*3/uL (ref 0.7–4.0)
MCH: 31.8 pg (ref 26.0–34.0)
MCHC: 32.1 g/dL (ref 30.0–36.0)
MCV: 99.1 fL (ref 80.0–100.0)
Monocytes Absolute: 0.9 10*3/uL (ref 0.1–1.0)
Monocytes Relative: 8 %
Neutro Abs: 7.1 10*3/uL (ref 1.7–7.7)
Neutrophils Relative %: 68 %
Platelet Count: 228 10*3/uL (ref 150–400)
RBC: 3.49 MIL/uL — ABNORMAL LOW (ref 3.87–5.11)
RDW: 15.1 % (ref 11.5–15.5)
WBC Count: 10.4 10*3/uL (ref 4.0–10.5)
nRBC: 0 % (ref 0.0–0.2)

## 2022-02-24 LAB — IRON AND IRON BINDING CAPACITY (CC-WL,HP ONLY)
Iron: 61 ug/dL (ref 28–170)
Saturation Ratios: 18 % (ref 10.4–31.8)
TIBC: 332 ug/dL (ref 250–450)
UIBC: 271 ug/dL (ref 148–442)

## 2022-02-24 LAB — LACTATE DEHYDROGENASE: LDH: 145 U/L (ref 98–192)

## 2022-02-24 MED ORDER — SODIUM CHLORIDE 0.9 % IV SOLN: Freq: Once | INTRAVENOUS | Status: DC

## 2022-02-24 MED ORDER — ATROPINE SULFATE 1 MG/ML IV SOLN
0.5000 mg | Freq: Once | INTRAVENOUS | Status: AC | PRN
  Administered 2022-02-24: 0.5 mg via INTRAVENOUS
  Filled 2022-02-24: qty 1

## 2022-02-24 MED ORDER — SODIUM CHLORIDE 0.9 % IV SOLN
1920.0000 mg/m2 | INTRAVENOUS | Status: DC
  Administered 2022-02-24: 3600 mg via INTRAVENOUS
  Filled 2022-02-24: qty 72

## 2022-02-24 MED ORDER — SODIUM CHLORIDE 0.9 % IV SOLN
400.0000 mg/m2 | Freq: Once | INTRAVENOUS | Status: AC
  Administered 2022-02-24: 748 mg via INTRAVENOUS
  Filled 2022-02-24: qty 37.4

## 2022-02-24 MED ORDER — SODIUM CHLORIDE 0.9 % IV SOLN
10.0000 mg | Freq: Once | INTRAVENOUS | Status: AC
  Administered 2022-02-24: 10 mg via INTRAVENOUS
  Filled 2022-02-24: qty 10

## 2022-02-24 MED ORDER — SODIUM CHLORIDE 0.9 % IV SOLN
144.0000 mg/m2 | Freq: Once | INTRAVENOUS | Status: AC
  Administered 2022-02-24: 260 mg via INTRAVENOUS
  Filled 2022-02-24: qty 5

## 2022-02-24 MED ORDER — FLUOROURACIL CHEMO INJECTION 2.5 GM/50ML
400.0000 mg/m2 | Freq: Once | INTRAVENOUS | Status: AC
  Administered 2022-02-24: 750 mg via INTRAVENOUS
  Filled 2022-02-24: qty 15

## 2022-02-24 MED ORDER — SODIUM CHLORIDE 0.9% FLUSH
10.0000 mL | Freq: Once | INTRAVENOUS | Status: AC
  Administered 2022-02-24: 10 mL via INTRAVENOUS

## 2022-02-24 MED ORDER — SODIUM CHLORIDE 0.9 % IV SOLN: Freq: Once | INTRAVENOUS | Status: AC

## 2022-02-24 MED ORDER — PALONOSETRON HCL INJECTION 0.25 MG/5ML
INTRAVENOUS | Status: AC
  Filled 2022-02-24: qty 5

## 2022-02-24 MED ORDER — PALONOSETRON HCL INJECTION 0.25 MG/5ML
0.2500 mg | Freq: Once | INTRAVENOUS | Status: AC
  Administered 2022-02-24: 0.25 mg via INTRAVENOUS

## 2022-02-24 NOTE — Progress Notes (Signed)
Hematology and Oncology Follow Up Visit  Bailey Walker [id number] 04-05-1946 76 y.o. 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  She is still having problems with her colostomy.  She did have some issues trying to get colostomy bags.  She took a picture of the skin around the colostomy.  The skin does look quite irritated.  From what her niece says, there was not much that surgery wants to do for her.  I am not sure what this means.  I would have to think that surgery would definitely be willing to try to help out.  Her last CEA level was 11.8.  This is holding steady.  She is having some pain at the colostomy site.  She has had no bleeding.  She has had no urinary issues.  She has had no fever.  She has had no cough or shortness of breath.  There has been no leg swelling.  Overall, I would have to say that her performance status is probably ECOG 1.    Medications:  Allergies as of 02/24/2022       Reactions   Nitroglycerin Nausea And Vomiting   Ampicillin Other (See Comments)   Per allergy test        Medication List        Accurate as of February 24, 2022 12:27 PM. If you have any questions, ask your nurse or doctor.          STOP taking these medications    acetaminophen 500 MG tablet Commonly known as: TYLENOL Stopped by: Volanda Napoleon, MD   ciprofloxacin 500 MG tablet Commonly known as: Cipro Stopped by: Volanda Napoleon, MD   morphine 15 MG 12 hr tablet Commonly known as: MS CONTIN Stopped by: Volanda Napoleon, MD   morphine 15 MG  tablet Commonly known as: MSIR Stopped by: Volanda Napoleon, MD   OVER THE COUNTER MEDICATION Stopped by: Volanda Napoleon, MD       TAKE these medications    alendronate 70 MG tablet Commonly known as: FOSAMAX Take 70 mg by mouth every Sunday.   aspirin EC 81 MG tablet Take 81 mg by mouth daily. Swallow whole.   atorvastatin 10 MG tablet Commonly known as: LIPITOR Take by mouth daily.   [employer] Gentle Laxative 5 MG EC tablet Generic drug: bisacodyl Take 5 mg by mouth daily as needed.   [employer]S Purelax 17 GM/SCOOP powder Generic drug: polyethylene glycol powder Take 17 g by mouth daily as needed.   dexamethasone 4 MG tablet Commonly known as: DECADRON Take 2 tablets (8 mg total) by mouth daily. Start the day after chemo for 2 days.   feeding supplement Liqd Take 237 mLs by mouth 3 (three) times daily between meals.   glipiZIDE 5 MG tablet Commonly known as: GLUCOTROL TAKE 1 TABLET BY MOUTH TWICE A DAY BEFORE MEALS   Klor-Con M20 20 MEQ tablet Generic drug: potassium chloride SA TAKE 1 TABLET BY MOUTH TWICE A DAY   levothyroxine 75 MCG tablet Commonly known as:  SYNTHROID TAKE 1 TABLET BY MOUTH EVERY DAY What changed: when to take this   loperamide 2 MG capsule Commonly known as: IMODIUM TAKE 2 CAPSULES (4 MG TOTAL) BY MOUTH AS NEEDED. TAKE 2 AT DIARRHEA ONSET , THEN 1 EVERY 2HR UNTIL 12HRS WITH NO BM. MAY TAKE 2 EVERY 4HRS AT NIGHT. IF DIARRHEA RECURS REPEAT.   metFORMIN 500 MG tablet Commonly known as: GLUCOPHAGE Take 0.5 tablets (250 mg total) by mouth 2 (two) times daily with a meal. What changed: how much to take   methocarbamol 500 MG tablet Commonly known as: ROBAXIN TAKE 1 TABLET BY MOUTH EVERY 12 HOURS AS NEEDED FOR MUSCLE SPASMS.   metoprolol tartrate 25 MG tablet Commonly known as: LOPRESSOR Take 1 tablet (25 mg total) by mouth 2 (two) times daily.   multivitamin with minerals Tabs tablet Take 1 tablet by mouth daily.   ondansetron 8 MG  tablet Commonly known as: ZOFRAN TAKE 1 TABLET (8 MG TOTAL) BY MOUTH 2 (TWO) TIMES DAILY AS NEEDED FOR REFRACTORY NAUSEA / VOMITING. START ON DAY 3 AFTER CHEMOTHERAPY.   pantoprazole 20 MG tablet Commonly known as: PROTONIX Take 1 tablet (20 mg total) by mouth daily.   prednisoLONE acetate 1 % ophthalmic suspension Commonly known as: PRED FORTE Place 1 drop into both eyes every Monday.   prochlorperazine 10 MG tablet Commonly known as: COMPAZINE Take 1 tablet (10 mg total) by mouth every 6 (six) hours as needed (NAUSEA).   silver sulfADIAZINE 1 % cream Commonly known as: Silvadene Apply 1 application topically daily.   traMADol 50 MG tablet Commonly known as: ULTRAM Take 1 tablet (50 mg total) by mouth every 6 (six) hours as needed.   Vitamin D (Ergocalciferol) 1.25 MG (50000 UNIT) Caps capsule Commonly known as: DRISDOL Take 50,000 Units by mouth once a week.        Allergies:  Allergies  Allergen Reactions   Nitroglycerin Nausea And Vomiting   Ampicillin Other (See Comments)    Per allergy test    Past Medical History, Surgical history, Social history, and Family History were reviewed and updated.  Review of Systems: Review of Systems  HENT: Negative.    Eyes: Negative.   Respiratory:  Positive for shortness of breath.   Cardiovascular: Negative.   Gastrointestinal:  Positive for abdominal pain.  Genitourinary: Negative.   Musculoskeletal:  Positive for back pain.  Skin: Negative.   Neurological: Negative.   Endo/Heme/Allergies: Negative.   Psychiatric/Behavioral: Negative.      Physical Exam:  weight is 168 lb 1.3 oz (76.2 kg). Her oral temperature is 98.2 F (36.8 C). Her blood pressure is 117/52 (abnormal) and her pulse is 80. Her respiration is 18 and oxygen saturation is 98%.   Wt Readings from Last 3 Encounters:  02/24/22 168 lb 1.3 oz (76.2 kg)  02/24/22 168 lb (76.2 kg)  02/03/22 169 lb (76.7 kg)    Physical Exam Vitals reviewed.  HENT:      Head: Normocephalic and atraumatic.  Eyes:     Pupils: Pupils are equal, round, and reactive to light.  Cardiovascular:     Rate and Rhythm: Normal rate and regular rhythm.     Heart sounds: Normal heart sounds.  Pulmonary:     Effort: Pulmonary effort is normal.     Breath sounds: Normal breath sounds.  Abdominal:     General: Bowel sounds are normal.     Palpations: Abdomen is soft.     Comments: Her abdomen is slightly  distended.  There is no obvious fluid wave.  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  She still has a dressing over the abdominal laparotomy scar.  She does not have any obvious erythema or warmth.  There is no exudate coming from the scars.  She has decent bowel sounds.  There is no palpable liver or spleen tip.  Musculoskeletal:        General: No tenderness or deformity. Normal range of motion.     Cervical back: Normal range of motion.  Lymphadenopathy:     Cervical: No cervical adenopathy.  Skin:    General: Skin is warm and dry.     Findings: No erythema or rash.  Neurological:     Mental Status: She is alert and oriented to person, place, and time.  Psychiatric:        Behavior: Behavior normal.        Thought Content: Thought content normal.        Judgment: Judgment normal.     Lab Results  Component Value Date   WBC 10.4 02/24/2022   HGB 11.1 (L) 02/24/2022   HCT 34.6 (L) 02/24/2022   MCV 99.1 02/24/2022   PLT 228 02/24/2022   Lab Results  Component Value Date   FERRITIN 285 12/31/2021   IRON 84 12/31/2021   TIBC 300 12/31/2021   UIBC 216 12/31/2021   IRONPCTSAT 28 12/31/2021   Lab Results  Component Value Date   RETICCTPCT 3.1 11/19/2021   RBC 3.49 (L) 02/24/2022   No results found for: KPAFRELGTCHN, LAMBDASER, KAPLAMBRATIO No results found for: IGGSERUM, IGA, IGMSERUM No results found for: Odetta Pink, SPEI   Chemistry      Component Value Date/Time   NA 136  02/24/2022 1005   NA 139 07/12/2019 1110   K 3.9 02/24/2022 1005   CL 102 02/24/2022 1005   CO2 23 02/24/2022 1005   BUN 17 02/24/2022 1005   BUN 15 07/12/2019 1110   CREATININE 0.73 02/24/2022 1005   CREATININE 0.74 01/08/2017 1100      Component Value Date/Time   CALCIUM 9.4 02/24/2022 1005   ALKPHOS 63 02/24/2022 1005   AST 25 02/24/2022 1005   ALT 32 02/24/2022 1005   BILITOT 0.4 02/24/2022 1005       Impression and Plan: Bailey Walker is a very pleasant 76 yo Kurdish female with 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   She has recurrent disease.  We are treating with FOLFIRI.  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  We really cannot use Avastin because of the abdominal wound.  It is still not healed up well enough that a I feel confident using Avastin.  I just hate the fact that she is having issues with the colostomy.  Hopefully, this will not be an issue for her.  I forgot to mention that she is on Neulasta now.  This is definitely helping her white blood cell count.  She probably is due for another set of scans in July.  We will plan to get her back in 3 weeks.  I think the 3-week protocol interval is probably best for her.     Volanda Napoleon, MD 6/5/202312:27 PM

## 2022-02-24 NOTE — Patient Instructions (Signed)
[facility] [place]  Discharge Instructions: Thank you for choosing [facility] to provide your oncology and hematology care.   If you have a lab appointment with the [facility], please go directly to the [facility] and check in at the registration area.  Wear comfortable clothing and clothing appropriate for easy access to any Portacath or PICC line.   We strive to give you quality time with your provider. You may need to reschedule your appointment if you arrive late (15 or more minutes).  Arriving late affects you and other patients whose appointments are after yours.  Also, if you miss three or more appointments without notifying the office, you may be dismissed from the clinic at the provider's discretion.      For prescription refill requests, have your pharmacy contact our office and allow 72 hours for refills to be completed.    Today you received the following chemotherapy and/or immunotherapy agents 5-fu, leucovorin, and irinotecan   To help prevent nausea and vomiting after your treatment, we encourage you to take your nausea medication as directed.  BELOW ARE SYMPTOMS THAT SHOULD BE REPORTED IMMEDIATELY: *FEVER GREATER THAN 100.4 F (38 C) OR HIGHER *CHILLS OR SWEATING *NAUSEA AND VOMITING THAT IS NOT CONTROLLED WITH YOUR NAUSEA MEDICATION *UNUSUAL SHORTNESS OF BREATH *UNUSUAL BRUISING OR BLEEDING *URINARY PROBLEMS (pain or burning when urinating, or frequent urination) *BOWEL PROBLEMS (unusual diarrhea, constipation, pain near the anus) TENDERNESS IN MOUTH AND THROAT WITH OR WITHOUT PRESENCE OF ULCERS (sore throat, sores in mouth, or a toothache) UNUSUAL RASH, SWELLING OR PAIN  UNUSUAL VAGINAL DISCHARGE OR ITCHING   Items with * indicate a potential emergency and should be followed up as soon as possible or go to the Emergency Department if any problems should occur.  Please show the CHEMOTHERAPY ALERT CARD or IMMUNOTHERAPY ALERT CARD  at check-in to the Emergency Department and triage nurse. Should you have questions after your visit or need to cancel or reschedule your appointment, please contact [facility]  [phone] and follow the prompts.  Office hours are 8:00 a.m. to 4:30 p.m. Monday - Friday. Please note that voicemails left after 4:00 p.m. may not be returned until the following business day.  We are closed weekends and major holidays. You have access to a nurse at all times for urgent questions. Please call the main number to the clinic [phone] and follow the prompts.  For any non-urgent questions, you may also contact your provider using MyChart. We now offer e-Visits for anyone 31 and older to request care online for non-urgent symptoms. For details visit [email or web address].   Also download the MyChart app! Go to the app store, search "MyChart", open the app, select , and log in with your MyChart username and password.  Due to Covid, a mask is required upon entering the hospital/clinic. If you do not have a mask, one will be given to you upon arrival. For doctor visits, patients may have 1 support person aged 18 or older with them. For treatment visits, patients cannot have anyone with them due to current Covid guidelines and our immunocompromised population.

## 2022-02-26 ENCOUNTER — Inpatient Hospital Stay: Payer: Medicaid Other

## 2022-02-26 VITALS — BP 132/67 | HR 82 | Temp 98.3°F | Resp 18

## 2022-02-26 DIAGNOSIS — Z5111 Encounter for antineoplastic chemotherapy: Secondary | ICD-10-CM | POA: Diagnosis not present

## 2022-02-26 DIAGNOSIS — D509 Iron deficiency anemia, unspecified: Secondary | ICD-10-CM

## 2022-02-26 DIAGNOSIS — C187 Malignant neoplasm of sigmoid colon: Secondary | ICD-10-CM

## 2022-02-26 MED ORDER — HEPARIN SOD (PORK) LOCK FLUSH 100 UNIT/ML IV SOLN
500.0000 [IU] | Freq: Once | INTRAVENOUS | Status: AC | PRN
  Administered 2022-02-26: 500 [IU]

## 2022-02-26 MED ORDER — PEGFILGRASTIM-CBQV 6 MG/0.6ML ~~LOC~~ SOSY
6.0000 mg | PREFILLED_SYRINGE | Freq: Once | SUBCUTANEOUS | Status: AC
  Administered 2022-02-26: 6 mg via SUBCUTANEOUS
  Filled 2022-02-26: qty 0.6

## 2022-02-26 MED ORDER — SODIUM CHLORIDE 0.9% FLUSH
10.0000 mL | INTRAVENOUS | Status: DC | PRN
  Administered 2022-02-26: 10 mL

## 2022-02-26 NOTE — Patient Instructions (Signed)

## 2022-03-02 ENCOUNTER — Other Ambulatory Visit: Payer: Self-pay | Admitting: Hematology & Oncology

## 2022-03-03 ENCOUNTER — Encounter: Payer: Self-pay | Admitting: Hematology & Oncology

## 2022-03-03 ENCOUNTER — Encounter: Payer: Self-pay | Admitting: Hematology and Oncology

## 2022-03-03 ENCOUNTER — Other Ambulatory Visit: Payer: Self-pay | Admitting: Hematology and Oncology

## 2022-03-06 ENCOUNTER — Other Ambulatory Visit: Payer: Self-pay | Admitting: *Deleted

## 2022-03-06 ENCOUNTER — Telehealth: Payer: Self-pay | Admitting: *Deleted

## 2022-03-06 DIAGNOSIS — D509 Iron deficiency anemia, unspecified: Secondary | ICD-10-CM

## 2022-03-06 DIAGNOSIS — C772 Secondary and unspecified malignant neoplasm of intra-abdominal lymph nodes: Secondary | ICD-10-CM

## 2022-03-06 DIAGNOSIS — D500 Iron deficiency anemia secondary to blood loss (chronic): Secondary | ICD-10-CM

## 2022-03-06 MED ORDER — TRAMADOL HCL 50 MG PO TABS: 50.0000 mg | ORAL_TABLET | Freq: Four times a day (QID) | ORAL | 3 refills | Status: DC | PRN

## 2022-03-06 MED ORDER — DEXAMETHASONE 4 MG PO TABS: 8.0000 mg | ORAL_TABLET | Freq: Every day | ORAL | 5 refills | Status: DC

## 2022-03-06 NOTE — Telephone Encounter (Signed)
Message received from patient's niece [name] requesting a referral to be sent to Dr. Morton Stall at [facility] for a second opinion.  Referral sent per order of Dr. Marin Olp.

## 2022-03-10 ENCOUNTER — Other Ambulatory Visit: Payer: Self-pay | Admitting: Hematology & Oncology

## 2022-03-17 ENCOUNTER — Inpatient Hospital Stay: Payer: Medicaid Other

## 2022-03-17 ENCOUNTER — Inpatient Hospital Stay (HOSPITAL_BASED_OUTPATIENT_CLINIC_OR_DEPARTMENT_OTHER): Payer: Medicaid Other | Admitting: Hematology & Oncology

## 2022-03-17 ENCOUNTER — Encounter: Payer: Self-pay | Admitting: Hematology & Oncology

## 2022-03-17 ENCOUNTER — Encounter: Payer: Self-pay | Admitting: Hematology and Oncology

## 2022-03-17 ENCOUNTER — Other Ambulatory Visit: Payer: Self-pay

## 2022-03-17 ENCOUNTER — Other Ambulatory Visit (HOSPITAL_BASED_OUTPATIENT_CLINIC_OR_DEPARTMENT_OTHER): Payer: Self-pay

## 2022-03-17 ENCOUNTER — Other Ambulatory Visit: Payer: Self-pay | Admitting: *Deleted

## 2022-03-17 VITALS — BP 141/64 | HR 80 | Temp 98.0°F | Resp 18 | Wt 170.0 lb

## 2022-03-17 DIAGNOSIS — C187 Malignant neoplasm of sigmoid colon: Secondary | ICD-10-CM

## 2022-03-17 DIAGNOSIS — C772 Secondary and unspecified malignant neoplasm of intra-abdominal lymph nodes: Secondary | ICD-10-CM

## 2022-03-17 DIAGNOSIS — Z5111 Encounter for antineoplastic chemotherapy: Secondary | ICD-10-CM | POA: Diagnosis not present

## 2022-03-17 LAB — CMP (CANCER CENTER ONLY)
ALT: 26 U/L (ref 0–44)
AST: 21 U/L (ref 15–41)
Albumin: 4.1 g/dL (ref 3.5–5.0)
Alkaline Phosphatase: 60 U/L (ref 38–126)
Anion gap: 10 (ref 5–15)
BUN: 12 mg/dL (ref 8–23)
CO2: 24 mmol/L (ref 22–32)
Calcium: 9.1 mg/dL (ref 8.9–10.3)
Chloride: 105 mmol/L (ref 98–111)
Creatinine: 0.57 mg/dL (ref 0.44–1.00)
GFR, Estimated: >60 mL/min (ref >60)
Glucose, Bld: 128 mg/dL — ABNORMAL HIGH (ref 70–99)
Potassium: 4 mmol/L (ref 3.5–5.1)
Sodium: 139 mmol/L (ref 135–145)
Total Bilirubin: 0.3 mg/dL (ref 0.3–1.2)
Total Protein: 6.6 g/dL (ref 6.5–8.1)

## 2022-03-17 LAB — CBC WITH DIFFERENTIAL (CANCER CENTER ONLY)
Abs Immature Granulocytes: 0.33 10*3/uL — ABNORMAL HIGH (ref 0.00–0.07)
Basophils Absolute: 0.1 10*3/uL (ref 0.0–0.1)
Basophils Relative: 1 %
Eosinophils Absolute: 0.3 10*3/uL (ref 0.0–0.5)
Eosinophils Relative: 3 %
HCT: 35.2 % — ABNORMAL LOW (ref 36.0–46.0)
Hemoglobin: 11.1 g/dL — ABNORMAL LOW (ref 12.0–15.0)
Immature Granulocytes: 3 %
Lymphocytes Relative: 20 %
Lymphs Abs: 2.4 10*3/uL (ref 0.7–4.0)
MCH: 31.9 pg (ref 26.0–34.0)
MCHC: 31.5 g/dL (ref 30.0–36.0)
MCV: 101.1 fL — ABNORMAL HIGH (ref 80.0–100.0)
Monocytes Absolute: 1.3 10*3/uL — ABNORMAL HIGH (ref 0.1–1.0)
Monocytes Relative: 11 %
Neutro Abs: 7.3 10*3/uL (ref 1.7–7.7)
Neutrophils Relative %: 62 %
Platelet Count: 190 10*3/uL (ref 150–400)
RBC: 3.48 MIL/uL — ABNORMAL LOW (ref 3.87–5.11)
RDW: 15.3 % (ref 11.5–15.5)
WBC Count: 11.7 10*3/uL — ABNORMAL HIGH (ref 4.0–10.5)
nRBC: 0 % (ref 0.0–0.2)

## 2022-03-17 LAB — RETICULOCYTES
Immature Retic Fract: 26.3 % — ABNORMAL HIGH (ref 2.3–15.9)
RBC.: 3.47 MIL/uL — ABNORMAL LOW (ref 3.87–5.11)
Retic Count, Absolute: 93.7 10*3/uL (ref 19.0–186.0)
Retic Ct Pct: 2.7 % (ref 0.4–3.1)

## 2022-03-17 LAB — CEA (IN HOUSE-CHCC): CEA (CHCC-In House): 12.05 ng/mL — ABNORMAL HIGH (ref 0.00–5.00)

## 2022-03-17 LAB — IRON AND IRON BINDING CAPACITY (CC-WL,HP ONLY)
Iron: 49 ug/dL (ref 28–170)
Saturation Ratios: 16 % (ref 10.4–31.8)
TIBC: 316 ug/dL (ref 250–450)
UIBC: 267 ug/dL (ref 148–442)

## 2022-03-17 LAB — FERRITIN: Ferritin: 288 ng/mL (ref 11–307)

## 2022-03-17 LAB — LACTATE DEHYDROGENASE: LDH: 151 U/L (ref 98–192)

## 2022-03-17 MED ORDER — SODIUM CHLORIDE 0.9 % IV SOLN
1920.0000 mg/m2 | INTRAVENOUS | Status: DC
  Administered 2022-03-17: 3600 mg via INTRAVENOUS
  Filled 2022-03-17: qty 72

## 2022-03-17 MED ORDER — HYDROCODONE-ACETAMINOPHEN 5-325 MG PO TABS: 1.0000 | ORAL_TABLET | Freq: Four times a day (QID) | ORAL | 0 refills | Status: DC | PRN

## 2022-03-17 MED ORDER — HYDROMORPHONE HCL 1 MG/ML IJ SOLN: 1.0000 mg | Freq: Once | INTRAMUSCULAR | Status: DC

## 2022-03-17 MED ORDER — ATROPINE SULFATE 1 MG/ML IV SOLN
0.5000 mg | Freq: Once | INTRAVENOUS | Status: AC | PRN
  Administered 2022-03-17: 0.5 mg via INTRAVENOUS
  Filled 2022-03-17: qty 1

## 2022-03-17 MED ORDER — SODIUM CHLORIDE 0.9 % IV SOLN
10.0000 mg | Freq: Once | INTRAVENOUS | Status: AC
  Administered 2022-03-17: 10 mg via INTRAVENOUS
  Filled 2022-03-17: qty 10

## 2022-03-17 MED ORDER — HYDROCODONE-ACETAMINOPHEN 5-325 MG PO TABS
1.0000 | ORAL_TABLET | Freq: Four times a day (QID) | ORAL | 0 refills | Status: DC | PRN
  Filled 2022-03-17: qty 60, 15d supply, fill #0

## 2022-03-17 MED ORDER — HYDROMORPHONE HCL 1 MG/ML IJ SOLN
1.0000 mg | Freq: Once | INTRAMUSCULAR | Status: AC
  Administered 2022-03-17: 1 mg via INTRAVENOUS
  Filled 2022-03-17: qty 1

## 2022-03-17 MED ORDER — SODIUM CHLORIDE 0.9 % IV SOLN
400.0000 mg/m2 | Freq: Once | INTRAVENOUS | Status: AC
  Administered 2022-03-17: 748 mg via INTRAVENOUS
  Filled 2022-03-17: qty 25

## 2022-03-17 MED ORDER — SODIUM CHLORIDE 0.9% FLUSH: 10.0000 mL | INTRAVENOUS | Status: DC | PRN

## 2022-03-17 MED ORDER — PALONOSETRON HCL INJECTION 0.25 MG/5ML
0.2500 mg | Freq: Once | INTRAVENOUS | Status: AC
  Administered 2022-03-17: 0.25 mg via INTRAVENOUS
  Filled 2022-03-17: qty 5

## 2022-03-17 MED ORDER — FLUOROURACIL CHEMO INJECTION 2.5 GM/50ML
400.0000 mg/m2 | Freq: Once | INTRAVENOUS | Status: AC
  Administered 2022-03-17: 750 mg via INTRAVENOUS
  Filled 2022-03-17: qty 15

## 2022-03-17 MED ORDER — HEPARIN SOD (PORK) LOCK FLUSH 100 UNIT/ML IV SOLN: 500.0000 [IU] | Freq: Once | INTRAVENOUS | Status: DC | PRN

## 2022-03-17 MED ORDER — SODIUM CHLORIDE 0.9 % IV SOLN
144.0000 mg/m2 | Freq: Once | INTRAVENOUS | Status: AC
  Administered 2022-03-17: 260 mg via INTRAVENOUS
  Filled 2022-03-17: qty 5

## 2022-03-17 MED ORDER — SODIUM CHLORIDE 0.9 % IV SOLN: Freq: Once | INTRAVENOUS | Status: AC

## 2022-03-18 ENCOUNTER — Telehealth: Payer: Self-pay | Admitting: *Deleted

## 2022-03-19 ENCOUNTER — Inpatient Hospital Stay: Payer: Medicaid Other

## 2022-03-19 VITALS — BP 134/85 | HR 82 | Temp 98.3°F | Resp 18

## 2022-03-19 DIAGNOSIS — C772 Secondary and unspecified malignant neoplasm of intra-abdominal lymph nodes: Secondary | ICD-10-CM

## 2022-03-19 DIAGNOSIS — D509 Iron deficiency anemia, unspecified: Secondary | ICD-10-CM

## 2022-03-19 DIAGNOSIS — Z5111 Encounter for antineoplastic chemotherapy: Secondary | ICD-10-CM | POA: Diagnosis not present

## 2022-03-19 MED ORDER — CYANOCOBALAMIN 1000 MCG/ML IJ SOLN
1000.0000 ug | Freq: Once | INTRAMUSCULAR | Status: AC
  Administered 2022-03-19: 1000 ug via INTRAMUSCULAR
  Filled 2022-03-19: qty 1

## 2022-03-19 MED ORDER — HEPARIN SOD (PORK) LOCK FLUSH 100 UNIT/ML IV SOLN: 500.0000 [IU] | Freq: Once | INTRAVENOUS | Status: DC | PRN

## 2022-03-19 MED ORDER — HEPARIN SOD (PORK) LOCK FLUSH 100 UNIT/ML IV SOLN
500.0000 [IU] | Freq: Once | INTRAVENOUS | Status: AC
  Administered 2022-03-19: 500 [IU] via INTRAVENOUS

## 2022-03-19 MED ORDER — SODIUM CHLORIDE 0.9% FLUSH
10.0000 mL | Freq: Once | INTRAVENOUS | Status: AC
  Administered 2022-03-19: 10 mL via INTRAVENOUS

## 2022-03-19 MED ORDER — PEGFILGRASTIM-CBQV 6 MG/0.6ML ~~LOC~~ SOSY
6.0000 mg | PREFILLED_SYRINGE | Freq: Once | SUBCUTANEOUS | Status: AC
  Administered 2022-03-19: 6 mg via SUBCUTANEOUS
  Filled 2022-03-19: qty 0.6

## 2022-03-19 MED ORDER — SODIUM CHLORIDE 0.9% FLUSH: 10.0000 mL | INTRAVENOUS | Status: DC | PRN

## 2022-03-19 NOTE — Patient Instructions (Signed)
Pegfilgrastim Injection What is this medication? PEGFILGRASTIM (PEG fil gra stim) lowers the risk of infection in people who are receiving chemotherapy. It works by helping your body make more white blood cells, which protects your body from infection. It may also be used to help people who have been exposed to high doses of radiation. This medicine may be used for other purposes; ask your health care provider or pharmacist if you have questions. COMMON BRAND NAME(S): Fulphila, Fylnetra, Neulasta, Nyvepria, Stimufend, UDENYCA, Ziextenzo What should I tell my care team before I take this medication? They need to know if you have any of these conditions: Kidney disease Latex allergy Ongoing radiation therapy Sickle cell disease Skin reactions to acrylic adhesives (On-Body Injector only) An unusual or allergic reaction to pegfilgrastim, filgrastim, other medications, foods, dyes, or preservatives Pregnant or trying to get pregnant Breast-feeding How should I use this medication? This medication is for injection under the skin. If you get this medication at home, you will be taught how to prepare and give the pre-filled syringe or how to use the On-body Injector. Refer to the patient Instructions for Use for detailed instructions. Use exactly as directed. Tell your care team immediately if you suspect that the On-body Injector may not have performed as intended or if you suspect the use of the On-body Injector resulted in a missed or partial dose. It is important that you put your used needles and syringes in a special sharps container. Do not put them in a trash can. If you do not have a sharps container, call your pharmacist or care team to get one. Talk to your care team about the use of this medication in children. While this medication may be prescribed for selected conditions, precautions do apply. Overdosage: If you think you have taken too much of this medicine contact a poison control center  or emergency room at once. NOTE: This medicine is only for you. Do not share this medicine with others. What if I miss a dose? It is important not to miss your dose. Call your care team if you miss your dose. If you miss a dose due to an On-body Injector failure or leakage, a new dose should be administered as soon as possible using a single prefilled syringe for manual use. What may interact with this medication? Interactions have not been studied. This list may not describe all possible interactions. Give your health care provider a list of all the medicines, herbs, non-prescription drugs, or dietary supplements you use. Also tell them if you smoke, drink alcohol, or use illegal drugs. Some items may interact with your medicine. What should I watch for while using this medication? Your condition will be monitored carefully while you are receiving this medication. You may need blood work done while you are taking this medication. Talk to your care team about your risk of cancer. You may be more at risk for certain types of cancer if you take this medication. If you are going to need a MRI, CT scan, or other procedure, tell your care team that you are using this medication (On-Body Injector only). What side effects may I notice from receiving this medication? Side effects that you should report to your care team as soon as possible: Allergic reactions--skin rash, itching, hives, swelling of the face, lips, tongue, or throat Capillary leak syndrome--stomach or muscle pain, unusual weakness or fatigue, feeling faint or lightheaded, decrease in the amount of urine, swelling of the ankles, hands, or feet, trouble   breathing High white blood cell level--fever, fatigue, trouble breathing, night sweats, change in vision, weight loss Inflammation of the aorta--fever, fatigue, back, chest, or stomach pain, severe headache Kidney injury (glomerulonephritis)--decrease in the amount of urine, red or dark brown  urine, foamy or bubbly urine, swelling of the ankles, hands, or feet Shortness of breath or trouble breathing Spleen injury--pain in upper left stomach or shoulder Unusual bruising or bleeding Side effects that usually do not require medical attention (report to your care team if they continue or are bothersome): Bone pain Pain in the hands or feet This list may not describe all possible side effects. Call your doctor for medical advice about side effects. You may report side effects to FDA at 1-800-FDA-1088. Where should I keep my medication? Keep out of the reach of children. If you are using this medication at home, you will be instructed on how to store it. Throw away any unused medication after the expiration date on the label. NOTE: This sheet is a summary. It may not cover all possible information. If you have questions about this medicine, talk to your doctor, pharmacist, or health care provider.  2023 Elsevier/Gold Standard (2021-08-09 00:00:00) Vitamin B12 Deficiency Vitamin B12 deficiency occurs when the body does not have enough of this important vitamin. The body needs this vitamin: To make red blood cells. To make DNA. This is the genetic material inside cells. To help the nerves work properly so they can carry messages from the brain to the body. Vitamin B12 deficiency can cause health problems, such as not having enough red blood cells in the blood (anemia). This can lead to nerve damage if untreated. What are the causes? This condition may be caused by: Not eating enough foods that contain vitamin B12. Not having enough stomach acid and digestive fluids to properly absorb vitamin B12 from the food that you eat. Having certain diseases that make it hard to absorb vitamin B12. These diseases include Crohn's disease, chronic pancreatitis, and cystic fibrosis. An autoimmune disorder in which the body does not make enough of a protein (intrinsic factor) within the stomach,  resulting in not enough absorption of vitamin B12. Having a surgery in which part of the stomach or small intestine is removed. Taking certain medicines that make it hard for the body to absorb vitamin B12. These include: Heartburn medicines, such as antacids and proton pump inhibitors. Some medicines that are used to treat diabetes. What increases the risk? The following factors may make you more likely to develop a vitamin B12 deficiency: Being an older adult. Eating a vegetarian or vegan diet that does not include any foods that come from animals. Eating a poor diet while you are pregnant. Taking certain medicines. Having alcoholism. What are the signs or symptoms? In some cases, there are no symptoms of this condition. If the condition leads to anemia or nerve damage, various symptoms may occur, such as: Weakness. Tiredness (fatigue). Loss of appetite. Numbness or tingling in your hands and feet. Redness and burning of the tongue. Depression, confusion, or memory problems. Trouble walking. If anemia is severe, symptoms can include: Shortness of breath. Dizziness. Rapid heart rate. How is this diagnosed? This condition may be diagnosed with a blood test to measure the level of vitamin B12 in your blood. You may also have other tests, including: A group of tests that measure certain characteristics of blood cells (complete blood count, CBC). A blood test to measure intrinsic factor. A procedure where a thin tube with  a camera on the end is used to look into your stomach or intestines (endoscopy). Other tests may be needed to discover the cause of the deficiency. How is this treated? Treatment for this condition depends on the cause. This condition may be treated by: Changing your eating and drinking habits, such as: Eating more foods that contain vitamin B12. Drinking less alcohol or no alcohol. Getting vitamin B12 injections. Taking vitamin B12 supplements by mouth (orally).  Your health care provider will tell you which dose is best for you. Follow these instructions at home: Eating and drinking  Include foods in your diet that come from animals and contain a lot of vitamin B12. These include: Meats and poultry. This includes beef, pork, chicken, [place], and organ meats, such as liver. Seafood. This includes clams, rainbow trout, salmon, tuna, and haddock. Eggs. Dairy foods such as milk, yogurt, and cheese. Eat foods that have vitamin B12 added to them (are fortified), such as ready-to-eat breakfast cereals. Check the label on the package to see if a food is fortified. The items listed above may not be a complete list of foods and beverages you can eat and drink. Contact a dietitian for more information. Alcohol use Do not drink alcohol if: Your health care provider tells you not to drink. You are pregnant, may be pregnant, or are planning to become pregnant. If you drink alcohol: Limit how much you have to: 0-1 drink a day for women. 0-2 drinks a day for men. Know how much alcohol is in your drink. In the [place] one drink equals one 12 oz bottle of beer (355 mL), one 5 oz glass of wine (148 mL), or one 1 oz glass of hard liquor (44 mL). General instructions Get vitamin B12 injections if told to by your health care provider. Take supplements only as told by your health care provider. Follow the directions carefully. Keep all follow-up visits. This is important. Contact a health care provider if: Your symptoms come back. Your symptoms get worse or do not improve with treatment. Get help right away: You develop shortness of breath. You have a rapid heart rate. You have chest pain. You become dizzy or you faint. These symptoms may be an emergency. Get help right away. Call 911. Do not wait to see if the symptoms will go away. Do not drive yourself to the hospital. Summary Vitamin B12 deficiency occurs when the body does not have enough of this important  vitamin. Common causes include not eating enough foods that contain vitamin B12, not being able to absorb vitamin B12 from the food that you eat, having a surgery in which part of the stomach or small intestine is removed, or taking certain medicines. Eat foods that have vitamin B12 in them. Treatment may include making a change in the way you eat and drink, getting vitamin B12 injections, or taking vitamin B12 supplements. This information is not intended to replace advice given to you by your health care provider. Make sure you discuss any questions you have with your health care provider. Document Revised: 05/03/2021 Document Reviewed: 05/03/2021 Elsevier Patient Education  [place] Insertion, Care After The following information offers guidance on how to care for yourself after your procedure. Your health care provider may also give you more specific instructions. If you have problems or questions, contact your health care provider. What can I expect after the procedure? After the procedure, it is common to have: Discomfort at the port insertion site. Bruising  on the skin over the port. This should improve over 3-4 days. Follow these instructions at home: [facility] care After your port is placed, you will get a manufacturer's information card. The card has information about your port. Keep this card with you at all times. Take care of the port as told by your health care provider. Ask your health care provider if you or a family member can get training for taking care of the port at home. A home health care nurse will be be available to help care for the port. Make sure to remember what type of port you have. Incision care     Follow instructions from your health care provider about how to take care of your port insertion site. Make sure you: Wash your hands with soap and water for at least 20 seconds before and after you change your bandage (dressing). If soap and  water are not available, use hand sanitizer. Change your dressing as told by your health care provider. Leave stitches (sutures), skin glue, or adhesive strips in place. These skin closures may need to stay in place for 2 weeks or longer. If adhesive strip edges start to loosen and curl up, you may trim the loose edges. Do not remove adhesive strips completely unless your health care provider tells you to do that. Check your port insertion site every day for signs of infection. Check for: Redness, swelling, or pain. Fluid or blood. Warmth. Pus or a bad smell. Activity Return to your normal activities as told by your health care provider. Ask your health care provider what activities are safe for you. You may have to avoid lifting. Ask your health care provider how much you can safely lift. General instructions Take over-the-counter and prescription medicines only as told by your health care provider. Do not take baths, swim, or use a hot tub until your health care provider approves. Ask your health care provider if you may take showers. You may only be allowed to take sponge baths. If you were given a sedative during the procedure, it can affect you for several hours. Do not drive or operate machinery until your health care provider says that it is safe. Wear a medical alert bracelet in case of an emergency. This will tell any health care providers that you have a port. Keep all follow-up visits. This is important. Contact a health care provider if: You cannot flush your port with saline as directed, or you cannot draw blood from the port. You have a fever or chills. You have redness, swelling, or pain around your port insertion site. You have fluid or blood coming from your port insertion site. Your port insertion site feels warm to the touch. You have pus or a bad smell coming from the port insertion site. Get help right away if: You have chest pain or shortness of breath. You have  bleeding from your port that you cannot control. These symptoms may be an emergency. Get help right away. Call 911. Do not wait to see if the symptoms will go away. Do not drive yourself to the hospital. Summary Take care of the port as told by your health care provider. Keep the manufacturer's information card with you at all times. Change your dressing as told by your health care provider. Contact a health care provider if you have a fever or chills or if you have redness, swelling, or pain around your port insertion site. Keep all follow-up visits. This information is not intended  to replace advice given to you by your health care provider. Make sure you discuss any questions you have with your health care provider. Document Revised: 03/12/2021 Document Reviewed: 03/12/2021 Elsevier Patient Education  [place].

## 2022-04-04 ENCOUNTER — Ambulatory Visit (HOSPITAL_BASED_OUTPATIENT_CLINIC_OR_DEPARTMENT_OTHER): Payer: Medicaid Other

## 2022-04-05 ENCOUNTER — Encounter (HOSPITAL_BASED_OUTPATIENT_CLINIC_OR_DEPARTMENT_OTHER): Payer: Self-pay

## 2022-04-05 ENCOUNTER — Ambulatory Visit (HOSPITAL_BASED_OUTPATIENT_CLINIC_OR_DEPARTMENT_OTHER)
Admission: RE | Admit: 2022-04-05 | Discharge: 2022-04-05 | Disposition: A | Discharge status: Home / Self Care | Payer: Medicaid Other | Source: Ambulatory Visit | Attending: Hematology & Oncology | Admitting: Hematology & Oncology

## 2022-04-05 DIAGNOSIS — C772 Secondary and unspecified malignant neoplasm of intra-abdominal lymph nodes: Secondary | ICD-10-CM | POA: Insufficient documentation

## 2022-04-05 DIAGNOSIS — C187 Malignant neoplasm of sigmoid colon: Secondary | ICD-10-CM | POA: Diagnosis present

## 2022-04-05 MED ORDER — IOHEXOL 300 MG/ML  SOLN
100.0000 mL | Freq: Once | INTRAMUSCULAR | Status: AC | PRN
Start: 2022-04-05 — End: 2022-04-05
  Administered 2022-04-05: 100 mL via INTRAVENOUS

## 2022-04-07 ENCOUNTER — Inpatient Hospital Stay: Payer: Medicaid Other

## 2022-04-07 ENCOUNTER — Inpatient Hospital Stay (HOSPITAL_BASED_OUTPATIENT_CLINIC_OR_DEPARTMENT_OTHER): Payer: Medicaid Other | Admitting: Hematology & Oncology

## 2022-04-07 ENCOUNTER — Telehealth: Payer: Self-pay | Admitting: *Deleted

## 2022-04-07 ENCOUNTER — Encounter: Payer: Self-pay | Admitting: *Deleted

## 2022-04-07 ENCOUNTER — Encounter: Payer: Self-pay | Admitting: Hematology & Oncology

## 2022-04-07 ENCOUNTER — Other Ambulatory Visit: Payer: Self-pay

## 2022-04-07 ENCOUNTER — Inpatient Hospital Stay: Payer: Medicaid Other | Attending: Gynecologic Oncology

## 2022-04-07 VITALS — BP 145/71 | HR 71 | Temp 97.6°F | Resp 17 | Wt 170.0 lb

## 2022-04-07 DIAGNOSIS — D510 Vitamin B12 deficiency anemia due to intrinsic factor deficiency: Secondary | ICD-10-CM | POA: Insufficient documentation

## 2022-04-07 DIAGNOSIS — Z5189 Encounter for other specified aftercare: Secondary | ICD-10-CM | POA: Diagnosis not present

## 2022-04-07 DIAGNOSIS — Z5111 Encounter for antineoplastic chemotherapy: Secondary | ICD-10-CM | POA: Insufficient documentation

## 2022-04-07 DIAGNOSIS — C187 Malignant neoplasm of sigmoid colon: Secondary | ICD-10-CM | POA: Insufficient documentation

## 2022-04-07 DIAGNOSIS — C772 Secondary and unspecified malignant neoplasm of intra-abdominal lymph nodes: Secondary | ICD-10-CM

## 2022-04-07 LAB — CBC WITH DIFFERENTIAL (CANCER CENTER ONLY)
Abs Immature Granulocytes: 0.33 10*3/uL — ABNORMAL HIGH (ref 0.00–0.07)
Basophils Absolute: 0.1 10*3/uL (ref 0.0–0.1)
Basophils Relative: 1 %
Eosinophils Absolute: 0.5 10*3/uL (ref 0.0–0.5)
Eosinophils Relative: 3 %
HCT: 34 % — ABNORMAL LOW (ref 36.0–46.0)
Hemoglobin: 11 g/dL — ABNORMAL LOW (ref 12.0–15.0)
Immature Granulocytes: 2 %
Lymphocytes Relative: 15 %
Lymphs Abs: 2.2 10*3/uL (ref 0.7–4.0)
MCH: 32.5 pg (ref 26.0–34.0)
MCHC: 32.4 g/dL (ref 30.0–36.0)
MCV: 100.6 fL — ABNORMAL HIGH (ref 80.0–100.0)
Monocytes Absolute: 1.4 10*3/uL — ABNORMAL HIGH (ref 0.1–1.0)
Monocytes Relative: 9 %
Neutro Abs: 10.4 10*3/uL — ABNORMAL HIGH (ref 1.7–7.7)
Neutrophils Relative %: 70 %
Platelet Count: 159 10*3/uL (ref 150–400)
RBC: 3.38 MIL/uL — ABNORMAL LOW (ref 3.87–5.11)
RDW: 14.6 % (ref 11.5–15.5)
WBC Count: 14.8 10*3/uL — ABNORMAL HIGH (ref 4.0–10.5)
nRBC: 0 % (ref 0.0–0.2)

## 2022-04-07 LAB — CMP (CANCER CENTER ONLY)
ALT: 20 U/L (ref 0–44)
AST: 17 U/L (ref 15–41)
Albumin: 4 g/dL (ref 3.5–5.0)
Alkaline Phosphatase: 66 U/L (ref 38–126)
Anion gap: 9 (ref 5–15)
BUN: 15 mg/dL (ref 8–23)
CO2: 27 mmol/L (ref 22–32)
Calcium: 8.9 mg/dL (ref 8.9–10.3)
Chloride: 102 mmol/L (ref 98–111)
Creatinine: 0.59 mg/dL (ref 0.44–1.00)
GFR, Estimated: >60 mL/min (ref >60)
Glucose, Bld: 145 mg/dL — ABNORMAL HIGH (ref 70–99)
Potassium: 3.6 mmol/L (ref 3.5–5.1)
Sodium: 138 mmol/L (ref 135–145)
Total Bilirubin: 0.4 mg/dL (ref 0.3–1.2)
Total Protein: 6.4 g/dL — ABNORMAL LOW (ref 6.5–8.1)

## 2022-04-07 LAB — CEA (IN HOUSE-CHCC): CEA (CHCC-In House): 11.07 ng/mL — ABNORMAL HIGH (ref 0.00–5.00)

## 2022-04-07 LAB — LACTATE DEHYDROGENASE: LDH: 160 U/L (ref 98–192)

## 2022-04-07 MED ORDER — SODIUM CHLORIDE 0.9 % IV SOLN: Freq: Once | INTRAVENOUS | Status: AC

## 2022-04-07 MED ORDER — SODIUM CHLORIDE 0.9 % IV SOLN: Freq: Once | INTRAVENOUS | Status: DC

## 2022-04-07 MED ORDER — SODIUM CHLORIDE 0.9 % IV SOLN
144.0000 mg/m2 | Freq: Once | INTRAVENOUS | Status: AC
  Administered 2022-04-07: 260 mg via INTRAVENOUS
  Filled 2022-04-07: qty 5

## 2022-04-07 MED ORDER — ATROPINE SULFATE 1 MG/ML IV SOLN
0.5000 mg | Freq: Once | INTRAVENOUS | Status: AC | PRN
  Administered 2022-04-07: 0.5 mg via INTRAVENOUS
  Filled 2022-04-07: qty 1

## 2022-04-07 MED ORDER — HEPARIN SOD (PORK) LOCK FLUSH 100 UNIT/ML IV SOLN: 500.0000 [IU] | Freq: Once | INTRAVENOUS | Status: DC | PRN

## 2022-04-07 MED ORDER — PALONOSETRON HCL INJECTION 0.25 MG/5ML
0.2500 mg | Freq: Once | INTRAVENOUS | Status: AC
  Administered 2022-04-07: 0.25 mg via INTRAVENOUS
  Filled 2022-04-07: qty 5

## 2022-04-07 MED ORDER — SODIUM CHLORIDE 0.9% FLUSH: 10.0000 mL | INTRAVENOUS | Status: DC | PRN

## 2022-04-07 MED ORDER — FLUOROURACIL CHEMO INJECTION 2.5 GM/50ML
400.0000 mg/m2 | Freq: Once | INTRAVENOUS | Status: AC
  Administered 2022-04-07: 750 mg via INTRAVENOUS
  Filled 2022-04-07: qty 15

## 2022-04-07 MED ORDER — SODIUM CHLORIDE 0.9 % IV SOLN
1920.0000 mg/m2 | INTRAVENOUS | Status: DC
  Administered 2022-04-07: 3600 mg via INTRAVENOUS
  Filled 2022-04-07: qty 72

## 2022-04-07 MED ORDER — SODIUM CHLORIDE 0.9 % IV SOLN
400.0000 mg/m2 | Freq: Once | INTRAVENOUS | Status: AC
  Administered 2022-04-07: 748 mg via INTRAVENOUS
  Filled 2022-04-07: qty 37.4

## 2022-04-07 MED ORDER — SODIUM CHLORIDE 0.9 % IV SOLN
10.0000 mg | Freq: Once | INTRAVENOUS | Status: AC
  Administered 2022-04-07: 10 mg via INTRAVENOUS
  Filled 2022-04-07: qty 10

## 2022-04-07 NOTE — Patient Instructions (Signed)

## 2022-04-07 NOTE — Progress Notes (Signed)
Hematology and Oncology Follow Up Visit  Bailey Walker [id number] [birth year] 76 y.o. 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  Unfortunately, she comes by herself.  I did talk to her niece over the phone.  She did see Dr. Morton Stall of colorectal surgery at [facility].  He said that there is not much that he could do with respect to her malignancy or with her colostomy.  Her last CEA level was stable at 12.1.  She had a CT scan done over the weekend.  Unfortunately, this is still not read.  She is still planning on going over to [place] in a couple weeks.  She will be gone for a month or so.  I think we will going to have to try to get her on Xeloda while she is gone.  I think this would be the best way of trying to help her out while she is on vacation seeing her family.  She has had no cough or shortness of breath.  She has had no leg swelling.  She has had no rashes.  There has been no fever.  Patient says there has been some slight bleeding from the rectal area.  Overall, I would say performance status is probably ECOG 1.     Medications:  Allergies as of 04/07/2022       Reactions   Nitroglycerin Nausea And Vomiting   Ampicillin Other (See Comments)   Per allergy test Per allergy test Per allergy test        Medication List        Accurate as of April 07, 2022  9:28 AM. If you have any questions, ask your nurse or doctor.          alendronate 70 MG tablet Commonly known as: FOSAMAX Take 70 mg by mouth every Sunday.   aspirin EC 81 MG tablet Take 81 mg  by mouth daily. Swallow whole.   atorvastatin 10 MG tablet Commonly known as: LIPITOR Take by mouth daily.   [employer] Gentle Laxative 5 MG EC tablet Generic drug: bisacodyl Take 5 mg by mouth daily as needed.   [employer] Purelax 17 GM/SCOOP powder Generic drug: polyethylene glycol powder Take 17 g by mouth daily as needed.   dexamethasone 4 MG tablet Commonly known as: DECADRON Take 2 tablets (8 mg total) by mouth daily. Start the day after chemo for 2 days.   feeding supplement Liqd Take 237 mLs by mouth 3 (three) times daily between meals.   glipiZIDE 5 MG tablet Commonly known as: GLUCOTROL TAKE 1 TABLET BY MOUTH TWICE A DAY BEFORE MEALS   HYDROcodone-acetaminophen 5-325 MG tablet Commonly known as: NORCO/VICODIN Take 1 tablet by mouth every 6 (six) hours as needed for moderate pain.   Klor-Con M20 20 MEQ tablet Generic drug: potassium chloride SA TAKE 1 TABLET BY MOUTH TWICE A DAY   levothyroxine 75 MCG tablet Commonly known as: SYNTHROID TAKE 1 TABLET BY MOUTH EVERY DAY What changed: when to take this   loperamide 2 MG capsule Commonly known as: IMODIUM  TAKE 2 CAPSULES (4 MG TOTAL) BY MOUTH AS NEEDED. TAKE 2 AT DIARRHEA ONSET , THEN 1 EVERY 2HR UNTIL 12HRS WITH NO BM. MAY TAKE 2 EVERY 4HRS AT NIGHT. IF DIARRHEA RECURS REPEAT.   metFORMIN 500 MG tablet Commonly known as: GLUCOPHAGE Take 0.5 tablets (250 mg total) by mouth 2 (two) times daily with a meal. What changed: how much to take   methocarbamol 500 MG tablet Commonly known as: ROBAXIN TAKE 1 TABLET BY MOUTH EVERY 12 HOURS AS NEEDED FOR MUSCLE SPASMS.   metoprolol tartrate 25 MG tablet Commonly known as: LOPRESSOR Take 1 tablet (25 mg total) by mouth 2 (two) times daily.   multivitamin with minerals Tabs tablet Take 1 tablet by mouth daily.   ondansetron 8 MG tablet Commonly known as: ZOFRAN TAKE 1 TABLET (8 MG TOTAL) BY MOUTH 2 (TWO) TIMES DAILY AS NEEDED FOR REFRACTORY NAUSEA / VOMITING. START ON DAY 3  AFTER CHEMOTHERAPY.   pantoprazole 20 MG tablet Commonly known as: PROTONIX Take 1 tablet (20 mg total) by mouth daily.   prednisoLONE acetate 1 % ophthalmic suspension Commonly known as: PRED FORTE Place 1 drop into both eyes every Monday.   prochlorperazine 10 MG tablet Commonly known as: COMPAZINE Take 1 tablet (10 mg total) by mouth every 6 (six) hours as needed (NAUSEA).   silver sulfADIAZINE 1 % cream Commonly known as: Silvadene Apply 1 application topically daily.   traMADol 50 MG tablet Commonly known as: ULTRAM Take 1 tablet (50 mg total) by mouth every 6 (six) hours as needed.   Vitamin D (Ergocalciferol) 1.25 MG (50000 UNIT) Caps capsule Commonly known as: DRISDOL Take 50,000 Units by mouth once a week.        Allergies:  Allergies  Allergen Reactions   Nitroglycerin Nausea And Vomiting   Ampicillin Other (See Comments)    Per allergy test Per allergy test Per allergy test    Past Medical History, Surgical history, Social history, and Family History were reviewed and updated.  Review of Systems: Review of Systems  HENT: Negative.    Eyes: Negative.   Respiratory:  Positive for shortness of breath.   Cardiovascular: Negative.   Gastrointestinal:  Positive for abdominal pain.  Genitourinary: Negative.   Musculoskeletal:  Positive for back pain.  Skin: Negative.   Neurological: Negative.   Endo/Heme/Allergies: Negative.   Psychiatric/Behavioral: Negative.       Physical Exam:  weight is 170 lb (77.1 kg). Her oral temperature is 97.6 F (36.4 C). Her blood pressure is 145/71 (abnormal) and her pulse is 71. Her respiration is 17 and oxygen saturation is 100%.   Wt Readings from Last 3 Encounters:  04/07/22 170 lb (77.1 kg)  03/17/22 170 lb (77.1 kg)  02/24/22 168 lb 1.3 oz (76.2 kg)    Physical Exam Vitals reviewed.  HENT:     Head: Normocephalic and atraumatic.  Eyes:     Pupils: Pupils are equal, round, and reactive to light.   Cardiovascular:     Rate and Rhythm: Normal rate and regular rhythm.     Heart sounds: Normal heart sounds.  Pulmonary:     Effort: Pulmonary effort is normal.     Breath sounds: Normal breath sounds.  Abdominal:     General: Bowel sounds are normal.     Palpations: Abdomen is soft.     Comments: Her abdomen is slightly distended.  There is no obvious fluid wave.  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  She  still has a dressing over the abdominal laparotomy scar.  She does not have any obvious erythema or warmth.  There is no exudate coming from the scars.  She has decent bowel sounds.  There is no palpable liver or spleen tip.  Musculoskeletal:        General: No tenderness or deformity. Normal range of motion.     Cervical back: Normal range of motion.  Lymphadenopathy:     Cervical: No cervical adenopathy.  Skin:    General: Skin is warm and dry.     Findings: No erythema or rash.  Neurological:     Mental Status: She is alert and oriented to person, place, and time.  Psychiatric:        Behavior: Behavior normal.        Thought Content: Thought content normal.        Judgment: Judgment normal.     Lab Results  Component Value Date   WBC 14.8 (H) 04/07/2022   HGB 11.0 (L) 04/07/2022   HCT 34.0 (L) 04/07/2022   MCV 100.6 (H) 04/07/2022   PLT 159 04/07/2022   Lab Results  Component Value Date   FERRITIN 288 03/17/2022   IRON 49 03/17/2022   TIBC 316 03/17/2022   UIBC 267 03/17/2022   IRONPCTSAT 16 03/17/2022   Lab Results  Component Value Date   RETICCTPCT 2.7 03/17/2022   RBC 3.38 (L) 04/07/2022   No results found for: "KPAFRELGTCHN", "LAMBDASER", "KAPLAMBRATIO" No results found for: "IGGSERUM", "IGA", "IGMSERUM" No results found for: "TOTALPROTELP", "ALBUMINELP", "A1GS", "A2GS", "BETS", "BETA2SER", "GAMS", "MSPIKE", "SPEI"   Chemistry      Component Value Date/Time   NA 138 04/07/2022 0830   NA 139 07/12/2019 1110   K 3.6 04/07/2022 0830   CL 102  04/07/2022 0830   CO2 27 04/07/2022 0830   BUN 15 04/07/2022 0830   BUN 15 07/12/2019 1110   CREATININE 0.59 04/07/2022 0830   CREATININE 0.74 01/08/2017 1100      Component Value Date/Time   CALCIUM 8.9 04/07/2022 0830   ALKPHOS 66 04/07/2022 0830   AST 17 04/07/2022 0830   ALT 20 04/07/2022 0830   BILITOT 0.4 04/07/2022 0830       Impression and Plan: Bailey Walker is a very pleasant 76 yo [place] female with stage IIIB ([id number]) 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   She has recurrent disease.  We are treating with FOLFIRI.  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   It really would be nice if we had the CT scan results back.  I do think can e probably to make a change and put her on Xeloda while she is gone.  I think she needs some form of therapy while she is gone.  She is planning on going for a month or 2.  Once we get the CAT scan results back, we will have to try to get her back and talk to her about changing treatment on her.  Again, I think Xeloda would be a good way of trying to help her out.  We will go ahead with her treatment today.  I will likely try to get her back in a couple weeks.  I think that [place] still [place] at that time.  We really cannot use Avastin because of the abdominal wound.     Volanda Napoleon, MD 7/17/20239:28 AM

## 2022-04-07 NOTE — Telephone Encounter (Signed)
Per 04/07/22 los - gave upcoming appointments - confirmed - gave printed calendar

## 2022-04-07 NOTE — Progress Notes (Signed)
Labs reviewed by MD, " ok to treat despsite counts"

## 2022-04-09 ENCOUNTER — Inpatient Hospital Stay: Payer: Medicaid Other

## 2022-04-09 VITALS — BP 135/61 | HR 84 | Temp 98.3°F | Resp 18

## 2022-04-09 DIAGNOSIS — Z5111 Encounter for antineoplastic chemotherapy: Secondary | ICD-10-CM | POA: Diagnosis not present

## 2022-04-09 DIAGNOSIS — Z95828 Presence of other vascular implants and grafts: Secondary | ICD-10-CM

## 2022-04-09 DIAGNOSIS — D509 Iron deficiency anemia, unspecified: Secondary | ICD-10-CM

## 2022-04-09 IMAGING — CT CT CHEST-ABD-PELV W/ CM
2 of 5 series · 13 of 36 positions shown, 15 images · IV contrast (agent unspecified)
Comparison: October 10, 2021.

CLINICAL DATA: A 75-year-old female presents for evaluation of
colon cancer, history of metastatic colon cancer.

* Tracking Code: BO *
EXAM:
CT CHEST, ABDOMEN, AND PELVIS WITH CONTRAST
TECHNIQUE: Multidetector CT imaging of the chest, abdomen and pelvis was
performed following the standard protocol during bolus
administration of intravenous contrast.

[Series 4: cap with 2 · axial · 0.83mm/px · z∈[-575,-110]mm · 10 of 115 slices shown, 12 images]
[im 11/115  mediastinal]
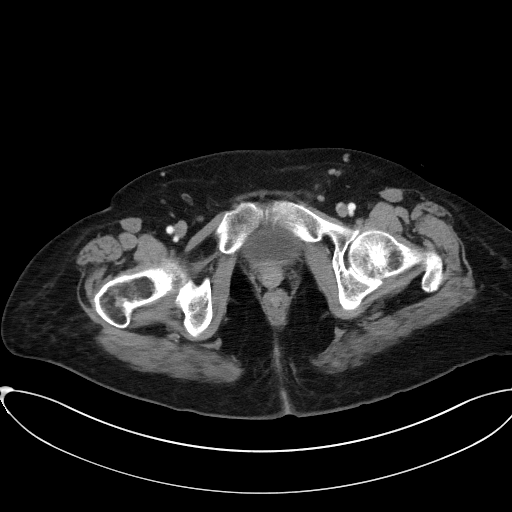
[im 11/115  bone]
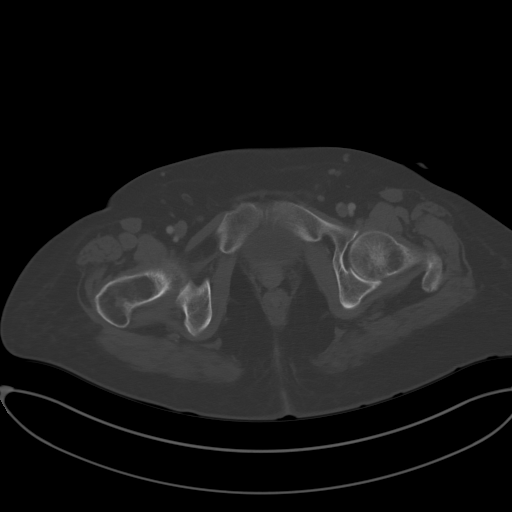
[im 21/115  mediastinal]
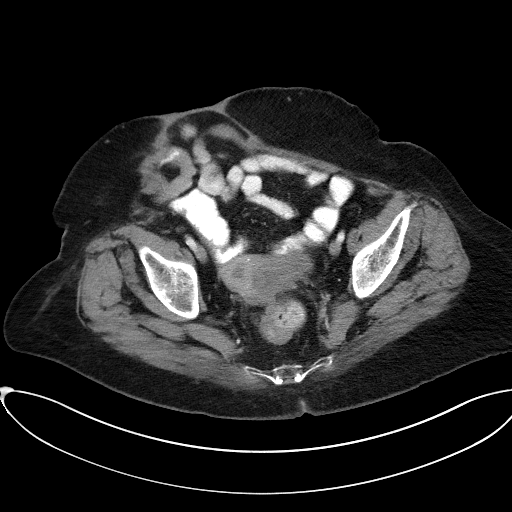
[im 32/115  mediastinal]
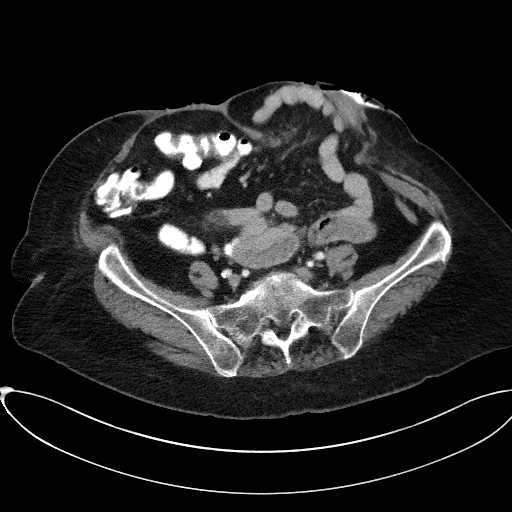
[im 42/115  mediastinal]
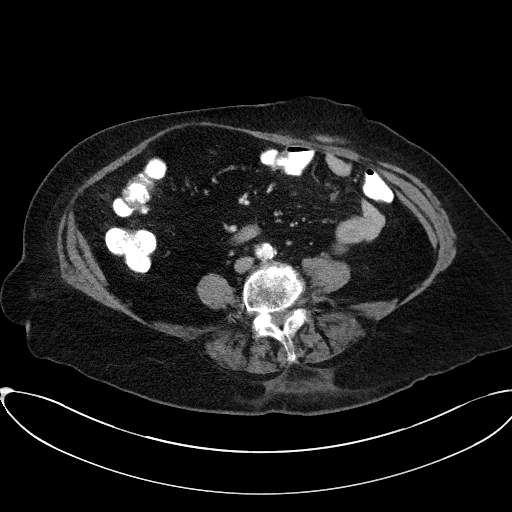
[im 52/115  mediastinal]
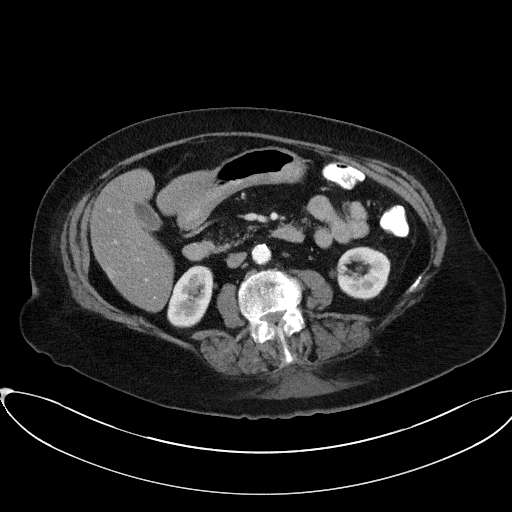
[im 63/115  mediastinal]
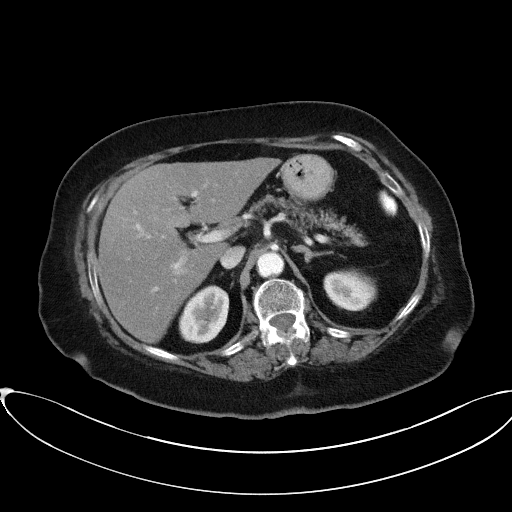
[im 73/115  mediastinal]
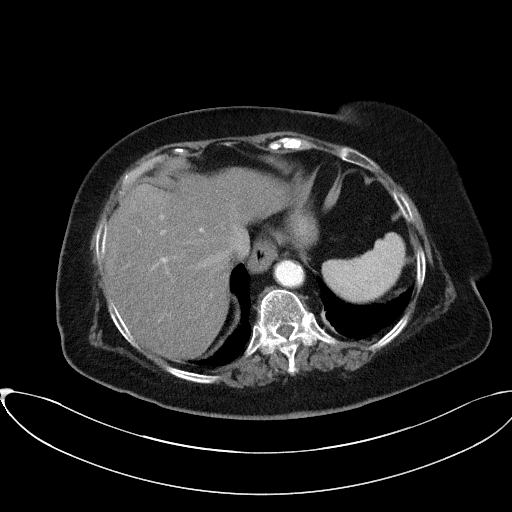
[im 83/115  mediastinal]
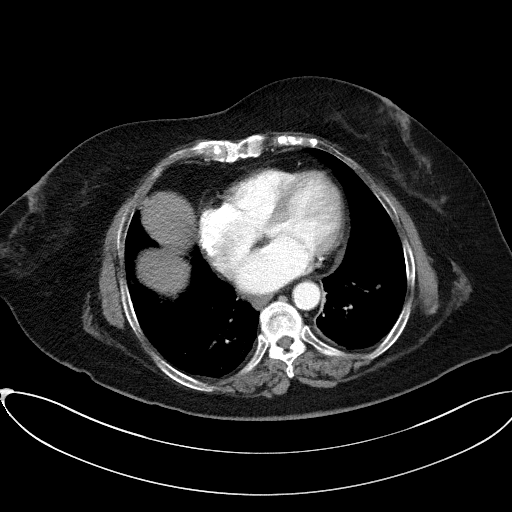
[im 94/115  mediastinal]
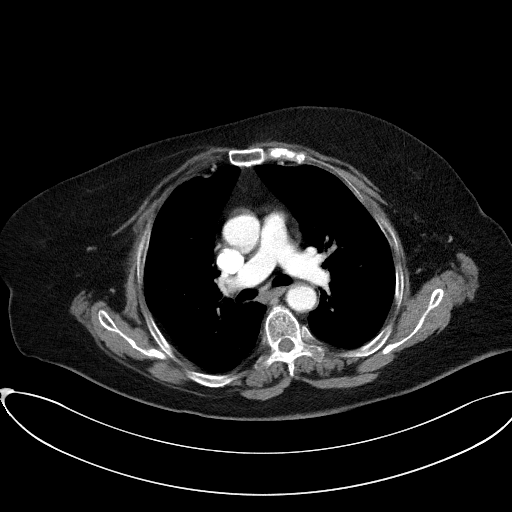
[im 94/115  bone]
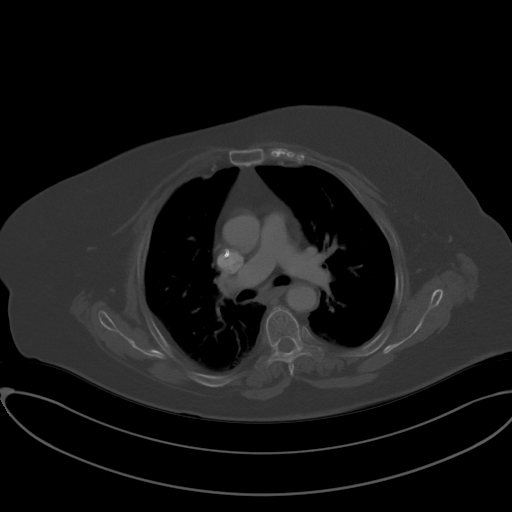
[im 104/115  mediastinal]
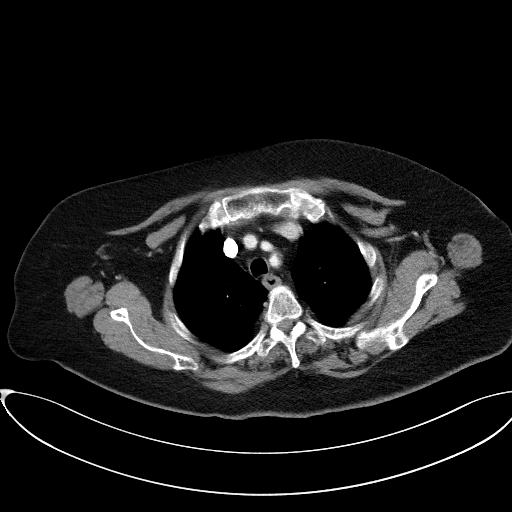

[Series 6: coronals · coronal · 0.90mm/px · 3 of 150 slices shown]
[im 30/150  mediastinal]
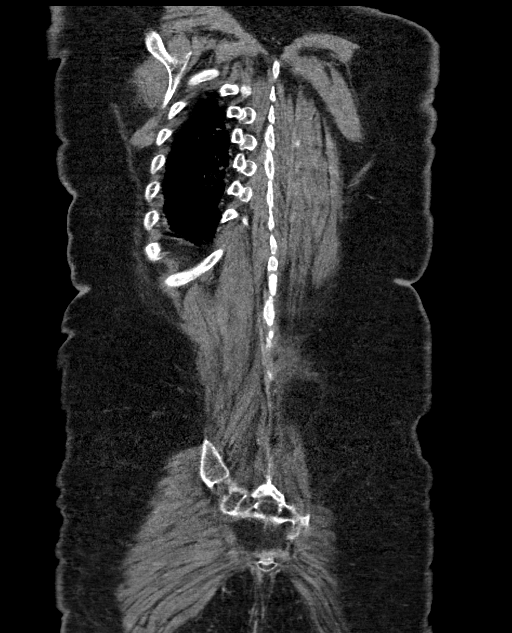
[im 60/150  mediastinal]
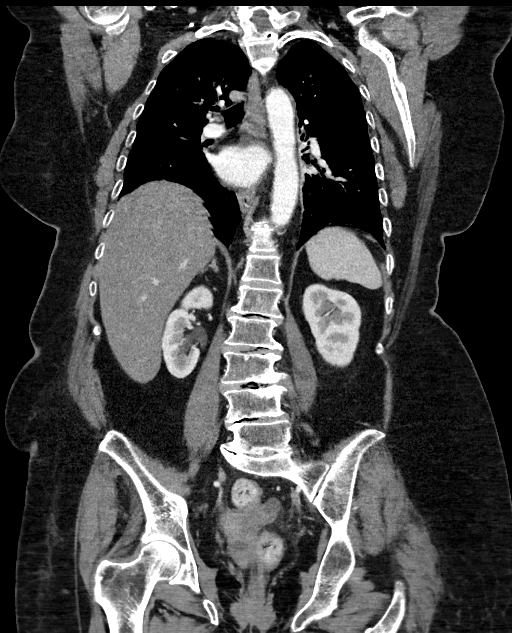
[im 90/150  mediastinal]
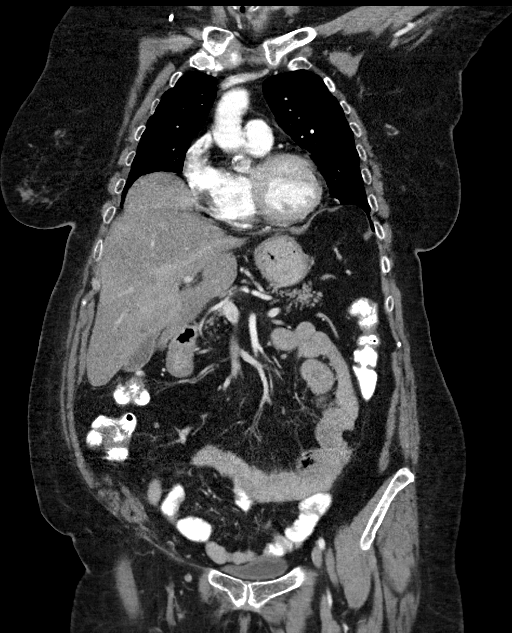

[13 of 36 positions shown; findings below may reference images not displayed]

RADIATION DOSE REDUCTION: This exam was performed according to the
departmental dose-optimization program which includes automated
exposure control, adjustment of the mA and/or kV according to
patient size and/or use of iterative reconstruction technique.

CONTRAST:  100mL OMNIPAQUE IOHEXOL 300 MG/ML  SOLN
FINDINGS: CT CHEST FINDINGS

Cardiovascular: Calcified and noncalcified aortic atherosclerotic
plaque. No aneurysmal dilation. Heart size is top normal without
pericardial effusion. Central pulmonary vasculature is normal
caliber.

Mediastinum/Nodes: RIGHT-sided Port-A-Cath terminates at the caval
to atrial junction. No adenopathy in the chest. Esophagus grossly
normal.

Lungs/Pleura: LEFT lower lobe pulmonary nodule (image 61/5) 14 mm,
previously 13 mm.

RIGHT upper lobe pulmonary nodule (image [DATE]) 18 mm previously 14
mm. No new pulmonary nodule. Basilar atelectasis. Airways are
patent. No signs of pleural effusion.

Musculoskeletal: See below for full musculoskeletal details.

CT ABDOMEN PELVIS FINDINGS

Hepatobiliary: Stable 6-7 mm hypervascular focus along the posterior
margin of the RIGHT hemiliver. No new hepatic lesion. Marked hepatic
steatosis limiting assessment for hypovascular hepatic lesions. No
pericholecystic stranding. No biliary duct dilation.

Pancreas: Pancreatic atrophy without signs of inflammation as
before.

Spleen: Normal.

Adrenals/Urinary Tract:

Adrenal glands are unremarkable. Symmetric renal enhancement. No
sign of hydronephrosis. No suspicious renal lesion or perinephric
stranding.

Urinary bladder is grossly unremarkable.

Stomach/Bowel: Small hiatal hernia. No acute gastrointestinal
process. Stable appearance of parastomal herniation of small bowel
about the LEFT lower quadrant colostomy.

Vascular/Lymphatic:

Aortic atherosclerosis. No sign of aneurysm. Smooth contour of the
IVC. There is no gastrohepatic or hepatoduodenal ligament
lymphadenopathy. No retroperitoneal or mesenteric lymphadenopathy.

No pelvic sidewall lymphadenopathy.

Reproductive: LEFT adnexal mass contiguous with the uterus showing
mixed attenuation when measured in a similar fashion at 6.2 as
compared to 6.3 cm greatest axial dimension, inseparable from the
anterior margin of the rectal pouch in close proximity to small
bowel loops in the LEFT lower quadrant.

Other: Signs of peritoneal disease (image 77/4) 2.1 cm nodule in the
RIGHT upper quadrant previously 2.4 cm.

Ventral abdominal wall with rectus diastasis and postoperative
changes similar to previous imaging. Small nodule in the peritoneum
on image 90/4) 8 mm within 1 mm of previous measurement.

Mesenteric thickening and nodularity is developing within the small
bowel mesentery of involved bowel loops herniated into the LEFT
lower quadrant ostomy (image 88/4) on image 88/5 a 14 mm area of
nodularity is present, subtle nodularity in this area on the
previous exam

Musculoskeletal: Osteopenia. Spinal degenerative changes. No acute
or destructive bone process.
IMPRESSION: 1. Largely stable disease in the chest, abdomen and pelvis with
slight decrease in 1 of the pulmonary nodules and in the RIGHT upper
quadrant peritoneal metastasis.
2. Subtle nodularity and or mild thickening of the mesentery
involving herniated small bowel loops in the LEFT lower quadrant
does raise the question however of developing and or worsening
peritoneal disease and warrants attention on follow-up.
3. Marked hepatic steatosis.
4. Aortic atherosclerosis.

Aortic Atherosclerosis ([id number]).

## 2022-04-09 MED ORDER — PEGFILGRASTIM-CBQV 6 MG/0.6ML ~~LOC~~ SOSY
6.0000 mg | PREFILLED_SYRINGE | Freq: Once | SUBCUTANEOUS | Status: AC
  Administered 2022-04-09: 6 mg via SUBCUTANEOUS
  Filled 2022-04-09: qty 0.6

## 2022-04-09 MED ORDER — SODIUM CHLORIDE 0.9% FLUSH
10.0000 mL | Freq: Once | INTRAVENOUS | Status: AC
  Administered 2022-04-09: 10 mL via INTRAVENOUS

## 2022-04-09 MED ORDER — HEPARIN SOD (PORK) LOCK FLUSH 100 UNIT/ML IV SOLN
500.0000 [IU] | Freq: Once | INTRAVENOUS | Status: AC
  Administered 2022-04-09: 500 [IU] via INTRAVENOUS

## 2022-04-09 NOTE — Patient Instructions (Signed)
Pegfilgrastim Injection What is this medication? PEGFILGRASTIM (PEG fil gra stim) lowers the risk of infection in people who are receiving chemotherapy. It works by helping your body make more white blood cells, which protects your body from infection. It may also be used to help people who have been exposed to high doses of radiation. This medicine may be used for other purposes; ask your health care provider or pharmacist if you have questions. COMMON BRAND NAME(S): Fulphila, Fylnetra, Neulasta, Nyvepria, Stimufend, UDENYCA, Ziextenzo What should I tell my care team before I take this medication? They need to know if you have any of these conditions: Kidney disease Latex allergy Ongoing radiation therapy Sickle cell disease Skin reactions to acrylic adhesives (On-Body Injector only) An unusual or allergic reaction to pegfilgrastim, filgrastim, other medications, foods, dyes, or preservatives Pregnant or trying to get pregnant Breast-feeding How should I use this medication? This medication is for injection under the skin. If you get this medication at home, you will be taught how to prepare and give the pre-filled syringe or how to use the On-body Injector. Refer to the patient Instructions for Use for detailed instructions. Use exactly as directed. Tell your care team immediately if you suspect that the On-body Injector may not have performed as intended or if you suspect the use of the On-body Injector resulted in a missed or partial dose. It is important that you put your used needles and syringes in a special sharps container. Do not put them in a trash can. If you do not have a sharps container, call your pharmacist or care team to get one. Talk to your care team about the use of this medication in children. While this medication may be prescribed for selected conditions, precautions do apply. Overdosage: If you think you have taken too much of this medicine contact a poison control center  or emergency room at once. NOTE: This medicine is only for you. Do not share this medicine with others. What if I miss a dose? It is important not to miss your dose. Call your care team if you miss your dose. If you miss a dose due to an On-body Injector failure or leakage, a new dose should be administered as soon as possible using a single prefilled syringe for manual use. What may interact with this medication? Interactions have not been studied. This list may not describe all possible interactions. Give your health care provider a list of all the medicines, herbs, non-prescription drugs, or dietary supplements you use. Also tell them if you smoke, drink alcohol, or use illegal drugs. Some items may interact with your medicine. What should I watch for while using this medication? Your condition will be monitored carefully while you are receiving this medication. You may need blood work done while you are taking this medication. Talk to your care team about your risk of cancer. You may be more at risk for certain types of cancer if you take this medication. If you are going to need a MRI, CT scan, or other procedure, tell your care team that you are using this medication (On-Body Injector only). What side effects may I notice from receiving this medication? Side effects that you should report to your care team as soon as possible: Allergic reactions--skin rash, itching, hives, swelling of the face, lips, tongue, or throat Capillary leak syndrome--stomach or muscle pain, unusual weakness or fatigue, feeling faint or lightheaded, decrease in the amount of urine, swelling of the ankles, hands, or feet, trouble   breathing High white blood cell level--fever, fatigue, trouble breathing, night sweats, change in vision, weight loss Inflammation of the aorta--fever, fatigue, back, chest, or stomach pain, severe headache Kidney injury (glomerulonephritis)--decrease in the amount of urine, red or dark brown  urine, foamy or bubbly urine, swelling of the ankles, hands, or feet Shortness of breath or trouble breathing Spleen injury--pain in upper left stomach or shoulder Unusual bruising or bleeding Side effects that usually do not require medical attention (report to your care team if they continue or are bothersome): Bone pain Pain in the hands or feet This list may not describe all possible side effects. Call your doctor for medical advice about side effects. You may report side effects to FDA at 1-800-FDA-1088. Where should I keep my medication? Keep out of the reach of children. If you are using this medication at home, you will be instructed on how to store it. Throw away any unused medication after the expiration date on the label. NOTE: This sheet is a summary. It may not cover all possible information. If you have questions about this medicine, talk to your doctor, pharmacist, or health care provider.  2023 Elsevier/Gold Standard (2021-08-09 00:00:00) Implanted Port Insertion, Care After The following information offers guidance on how to care for yourself after your procedure. Your health care provider may also give you more specific instructions. If you have problems or questions, contact your health care provider. What can I expect after the procedure? After the procedure, it is common to have: Discomfort at the port insertion site. Bruising on the skin over the port. This should improve over 3-4 days. Follow these instructions at home: [facility] care After your port is placed, you will get a manufacturer's information card. The card has information about your port. Keep this card with you at all times. Take care of the port as told by your health care provider. Ask your health care provider if you or a family member can get training for taking care of the port at home. A home health care nurse will be be available to help care for the port. Make sure to remember what type of port you  have. Incision care     Follow instructions from your health care provider about how to take care of your port insertion site. Make sure you: Wash your hands with soap and water for at least 20 seconds before and after you change your bandage (dressing). If soap and water are not available, use hand sanitizer. Change your dressing as told by your health care provider. Leave stitches (sutures), skin glue, or adhesive strips in place. These skin closures may need to stay in place for 2 weeks or longer. If adhesive strip edges start to loosen and curl up, you may trim the loose edges. Do not remove adhesive strips completely unless your health care provider tells you to do that. Check your port insertion site every day for signs of infection. Check for: Redness, swelling, or pain. Fluid or blood. Warmth. Pus or a bad smell. Activity Return to your normal activities as told by your health care provider. Ask your health care provider what activities are safe for you. You may have to avoid lifting. Ask your health care provider how much you can safely lift. General instructions Take over-the-counter and prescription medicines only as told by your health care provider. Do not take baths, swim, or use a hot tub until your health care provider approves. Ask your health care provider if you may  take showers. You may only be allowed to take sponge baths. If you were given a sedative during the procedure, it can affect you for several hours. Do not drive or operate machinery until your health care provider says that it is safe. Wear a medical alert bracelet in case of an emergency. This will tell any health care providers that you have a port. Keep all follow-up visits. This is important. Contact a health care provider if: You cannot flush your port with saline as directed, or you cannot draw blood from the port. You have a fever or chills. You have redness, swelling, or pain around your port insertion  site. You have fluid or blood coming from your port insertion site. Your port insertion site feels warm to the touch. You have pus or a bad smell coming from the port insertion site. Get help right away if: You have chest pain or shortness of breath. You have bleeding from your port that you cannot control. These symptoms may be an emergency. Get help right away. Call 911. Do not wait to see if the symptoms will go away. Do not drive yourself to the hospital. Summary Take care of the port as told by your health care provider. Keep the manufacturer's information card with you at all times. Change your dressing as told by your health care provider. Contact a health care provider if you have a fever or chills or if you have redness, swelling, or pain around your port insertion site. Keep all follow-up visits. This information is not intended to replace advice given to you by your health care provider. Make sure you discuss any questions you have with your health care provider. Document Revised: 03/12/2021 Document Reviewed: 03/12/2021 Elsevier Patient Education  [place].

## 2022-04-11 ENCOUNTER — Encounter: Payer: Self-pay | Admitting: Hematology & Oncology

## 2022-04-14 ENCOUNTER — Other Ambulatory Visit: Payer: Self-pay

## 2022-04-15 ENCOUNTER — Encounter (HOSPITAL_COMMUNITY): Payer: Self-pay | Admitting: Nurse Practitioner

## 2022-04-17 ENCOUNTER — Other Ambulatory Visit: Payer: Self-pay

## 2022-04-21 ENCOUNTER — Encounter: Payer: Self-pay | Admitting: *Deleted

## 2022-04-22 ENCOUNTER — Other Ambulatory Visit: Payer: Self-pay

## 2022-04-24 ENCOUNTER — Telehealth: Payer: Self-pay | Admitting: Oncology

## 2022-04-24 ENCOUNTER — Inpatient Hospital Stay: Payer: Medicaid Other | Admitting: Hematology & Oncology

## 2022-04-24 ENCOUNTER — Other Ambulatory Visit: Payer: Self-pay

## 2022-04-24 ENCOUNTER — Encounter: Payer: Self-pay | Admitting: Hematology & Oncology

## 2022-04-24 ENCOUNTER — Inpatient Hospital Stay: Payer: Medicaid Other

## 2022-04-24 ENCOUNTER — Telehealth: Payer: Self-pay | Admitting: *Deleted

## 2022-04-24 VITALS — BP 164/74 | HR 77 | Temp 97.7°F | Resp 16 | Wt 172.0 lb

## 2022-04-24 DIAGNOSIS — Z5111 Encounter for antineoplastic chemotherapy: Secondary | ICD-10-CM | POA: Insufficient documentation

## 2022-04-24 DIAGNOSIS — D510 Vitamin B12 deficiency anemia due to intrinsic factor deficiency: Secondary | ICD-10-CM | POA: Insufficient documentation

## 2022-04-24 DIAGNOSIS — C7802 Secondary malignant neoplasm of left lung: Secondary | ICD-10-CM | POA: Insufficient documentation

## 2022-04-24 DIAGNOSIS — Z87891 Personal history of nicotine dependence: Secondary | ICD-10-CM | POA: Insufficient documentation

## 2022-04-24 DIAGNOSIS — Z5189 Encounter for other specified aftercare: Secondary | ICD-10-CM | POA: Insufficient documentation

## 2022-04-24 DIAGNOSIS — C772 Secondary and unspecified malignant neoplasm of intra-abdominal lymph nodes: Secondary | ICD-10-CM

## 2022-04-24 DIAGNOSIS — D509 Iron deficiency anemia, unspecified: Secondary | ICD-10-CM

## 2022-04-24 DIAGNOSIS — R911 Solitary pulmonary nodule: Secondary | ICD-10-CM | POA: Insufficient documentation

## 2022-04-24 DIAGNOSIS — Z933 Colostomy status: Secondary | ICD-10-CM | POA: Diagnosis not present

## 2022-04-24 DIAGNOSIS — C187 Malignant neoplasm of sigmoid colon: Secondary | ICD-10-CM | POA: Insufficient documentation

## 2022-04-24 DIAGNOSIS — Z510 Encounter for antineoplastic radiation therapy: Secondary | ICD-10-CM | POA: Diagnosis not present

## 2022-04-24 LAB — CMP (CANCER CENTER ONLY)
ALT: 21 U/L (ref 0–44)
AST: 19 U/L (ref 15–41)
Albumin: 4 g/dL (ref 3.5–5.0)
Alkaline Phosphatase: 85 U/L (ref 38–126)
Anion gap: 11 (ref 5–15)
BUN: 7 mg/dL — ABNORMAL LOW (ref 8–23)
CO2: 25 mmol/L (ref 22–32)
Calcium: 8.9 mg/dL (ref 8.9–10.3)
Chloride: 104 mmol/L (ref 98–111)
Creatinine: 0.66 mg/dL (ref 0.44–1.00)
GFR, Estimated: >60 mL/min (ref >60)
Glucose, Bld: 148 mg/dL — ABNORMAL HIGH (ref 70–99)
Potassium: 3.5 mmol/L (ref 3.5–5.1)
Sodium: 140 mmol/L (ref 135–145)
Total Bilirubin: 0.4 mg/dL (ref 0.3–1.2)
Total Protein: 6 g/dL — ABNORMAL LOW (ref 6.5–8.1)

## 2022-04-24 LAB — CBC WITH DIFFERENTIAL (CANCER CENTER ONLY)
Abs Immature Granulocytes: 1.64 10*3/uL — ABNORMAL HIGH (ref 0.00–0.07)
Basophils Absolute: 0.1 10*3/uL (ref 0.0–0.1)
Basophils Relative: 1 %
Eosinophils Absolute: 0.7 10*3/uL — ABNORMAL HIGH (ref 0.0–0.5)
Eosinophils Relative: 4 %
HCT: 33.7 % — ABNORMAL LOW (ref 36.0–46.0)
Hemoglobin: 10.7 g/dL — ABNORMAL LOW (ref 12.0–15.0)
Immature Granulocytes: 9 %
Lymphocytes Relative: 13 %
Lymphs Abs: 2.5 10*3/uL (ref 0.7–4.0)
MCH: 31.7 pg (ref 26.0–34.0)
MCHC: 31.8 g/dL (ref 30.0–36.0)
MCV: 99.7 fL (ref 80.0–100.0)
Monocytes Absolute: 1.4 10*3/uL — ABNORMAL HIGH (ref 0.1–1.0)
Monocytes Relative: 7 %
Neutro Abs: 12.4 10*3/uL — ABNORMAL HIGH (ref 1.7–7.7)
Neutrophils Relative %: 66 %
Platelet Count: 178 10*3/uL (ref 150–400)
RBC: 3.38 MIL/uL — ABNORMAL LOW (ref 3.87–5.11)
RDW: 14.3 % (ref 11.5–15.5)
WBC Count: 18.7 10*3/uL — ABNORMAL HIGH (ref 4.0–10.5)
nRBC: 0.2 % (ref 0.0–0.2)

## 2022-04-24 LAB — FERRITIN: Ferritin: 271 ng/mL (ref 11–307)

## 2022-04-24 LAB — IRON AND IRON BINDING CAPACITY (CC-WL,HP ONLY)
Iron: 104 ug/dL (ref 28–170)
Saturation Ratios: 35 % — ABNORMAL HIGH (ref 10.4–31.8)
TIBC: 300 ug/dL (ref 250–450)
UIBC: 196 ug/dL (ref 148–442)

## 2022-04-24 LAB — CEA (IN HOUSE-CHCC): CEA (CHCC-In House): 11.98 ng/mL — ABNORMAL HIGH (ref 0.00–5.00)

## 2022-04-24 MED ORDER — SODIUM CHLORIDE 0.9% FLUSH
3.0000 mL | Freq: Once | INTRAVENOUS | Status: AC | PRN
  Administered 2022-04-24: 10 mL

## 2022-04-24 MED ORDER — HEPARIN SOD (PORK) LOCK FLUSH 100 UNIT/ML IV SOLN
250.0000 [IU] | Freq: Once | INTRAVENOUS | Status: AC | PRN
  Administered 2022-04-24: 500 [IU]

## 2022-04-24 NOTE — Telephone Encounter (Signed)
Per 04/24/22 los - gave upcoming appointments - confirmed

## 2022-04-24 NOTE — Telephone Encounter (Signed)
Called [name] (niece) to see when Bailey Walker will be going to [place].  She said they are not sure yet but probably the beginning or mid-September. Gave her my number to call back when they know for sure when she will be going.

## 2022-04-24 NOTE — Progress Notes (Signed)
Hematology and Oncology Follow Up Visit  Bailey Walker [id number] Jan 02, 1946 76 y.o. 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  She is still not sure when she is gone over to [place].  It sounds like she will be gone over some time and September.  We did do a CT scan on her.  This was done today.  The CT scan showed that she had relatively stable disease.  However, there was growth in a lung nodule in the left lower lung measuring 1.7 x 1.1 cm.  I think that we should try to see if we can treat this with radiosurgery.  I spoke with Dr. Sondra Come of Radiation Oncology to see if he would agree.  She still has the issues with her colostomy.  This still causes some discomfort.  Her last CEA level, which we have back today was holding steady at 11.8.  She has had no problems with leg swelling.  She had no problems with her appetite.  She is eating okay.  She is having no problems with cough or shortness of breath.  Again, she is not sure when she is to be handed over to [place].  When she goes, she will probably for a couple months.  Currently, I would say performance status is probably ECOG 1.    Medications:  Allergies as of 04/24/2022       Reactions   Nitroglycerin Nausea And Vomiting   Ampicillin Other (See Comments)   Per allergy test Per allergy test Per allergy test        Medication List        Accurate as of April 24, 2022  9:06 AM. If you have any questions, ask your nurse or doctor.          alendronate 70 MG tablet Commonly known as: FOSAMAX Take 70 mg by  mouth every Sunday.   aspirin EC 81 MG tablet Take 81 mg by mouth daily. Swallow whole.   atorvastatin 40 MG tablet Commonly known as: LIPITOR Take 1 tablet by mouth daily. What changed: Another medication with the same name was removed. Continue taking this medication, and follow the directions you see here. Changed by: Volanda Napoleon, MD   [employer] Gentle Laxative 5 MG EC tablet Generic drug: bisacodyl Take 5 mg by mouth daily as needed.   [employer] Purelax 17 GM/SCOOP powder Generic drug: polyethylene glycol powder Take 17 g by mouth daily as needed.   dexamethasone 4 MG tablet Commonly known as: DECADRON Take 2 tablets (8 mg total) by mouth daily. Start the day after chemo for 2 days.   feeding supplement Liqd Take 237 mLs by mouth 3 (three) times daily between meals.   glipiZIDE 5 MG tablet Commonly known as: GLUCOTROL TAKE 1 TABLET BY MOUTH TWICE A DAY BEFORE MEALS   HYDROcodone-acetaminophen 5-325 MG tablet Commonly known as: NORCO/VICODIN Take 1 tablet by mouth every 6 (six) hours as needed for moderate pain.   Klor-Con M20 20 MEQ tablet Generic drug: potassium chloride SA TAKE 1 TABLET BY  MOUTH TWICE A DAY   levothyroxine 75 MCG tablet Commonly known as: SYNTHROID TAKE 1 TABLET BY MOUTH EVERY DAY What changed: when to take this   loperamide 2 MG capsule Commonly known as: IMODIUM TAKE 2 CAPSULES (4 MG TOTAL) BY MOUTH AS NEEDED. TAKE 2 AT DIARRHEA ONSET , THEN 1 EVERY 2HR UNTIL 12HRS WITH NO BM. MAY TAKE 2 EVERY 4HRS AT NIGHT. IF DIARRHEA RECURS REPEAT.   metFORMIN 500 MG tablet Commonly known as: GLUCOPHAGE Take 0.5 tablets (250 mg total) by mouth 2 (two) times daily with a meal. What changed: how much to take   methocarbamol 500 MG tablet Commonly known as: ROBAXIN TAKE 1 TABLET BY MOUTH EVERY 12 HOURS AS NEEDED FOR MUSCLE SPASMS.   metoprolol tartrate 25 MG tablet Commonly known as: LOPRESSOR Take 1 tablet (25 mg total) by mouth 2 (two) times daily.    multivitamin with minerals Tabs tablet Take 1 tablet by mouth daily.   ondansetron 8 MG tablet Commonly known as: ZOFRAN TAKE 1 TABLET (8 MG TOTAL) BY MOUTH 2 (TWO) TIMES DAILY AS NEEDED FOR REFRACTORY NAUSEA / VOMITING. START ON DAY 3 AFTER CHEMOTHERAPY.   pantoprazole 20 MG tablet Commonly known as: PROTONIX Take 1 tablet by mouth daily.   pantoprazole 20 MG tablet Commonly known as: PROTONIX Take 1 tablet (20 mg total) by mouth daily.   prednisoLONE acetate 1 % ophthalmic suspension Commonly known as: PRED FORTE Place 1 drop into both eyes every Monday.   prochlorperazine 10 MG tablet Commonly known as: COMPAZINE Take 1 tablet (10 mg total) by mouth every 6 (six) hours as needed (NAUSEA).   silver sulfADIAZINE 1 % cream Commonly known as: Silvadene Apply 1 application topically daily.   traMADol 50 MG tablet Commonly known as: ULTRAM Take 1 tablet (50 mg total) by mouth every 6 (six) hours as needed.   Vitamin D (Ergocalciferol) 1.25 MG (50000 UNIT) Caps capsule Commonly known as: DRISDOL Take 50,000 Units by mouth once a week.        Allergies:  Allergies  Allergen Reactions   Nitroglycerin Nausea And Vomiting   Ampicillin Other (See Comments)    Per allergy test Per allergy test Per allergy test    Past Medical History, Surgical history, Social history, and Family History were reviewed and updated.  Review of Systems: Review of Systems  HENT: Negative.    Eyes: Negative.   Respiratory:  Positive for shortness of breath.   Cardiovascular: Negative.   Gastrointestinal:  Positive for abdominal pain.  Genitourinary: Negative.   Musculoskeletal:  Positive for back pain.  Skin: Negative.   Neurological: Negative.   Endo/Heme/Allergies: Negative.   Psychiatric/Behavioral: Negative.       Physical Exam:  weight is 172 lb (78 kg). Her oral temperature is 97.7 F (36.5 C). Her blood pressure is 164/74 (abnormal) and her pulse is 77. Her respiration is  16 and oxygen saturation is 98%.   Wt Readings from Last 3 Encounters:  04/24/22 172 lb (78 kg)  04/07/22 170 lb (77.1 kg)  03/17/22 170 lb (77.1 kg)    Physical Exam Vitals reviewed.  HENT:     Head: Normocephalic and atraumatic.  Eyes:     Pupils: Pupils are equal, round, and reactive to light.  Cardiovascular:     Rate and Rhythm: Normal rate and regular rhythm.     Heart sounds: Normal heart sounds.  Pulmonary:     Effort: Pulmonary effort is normal.     Breath  sounds: Normal breath sounds.  Abdominal:     General: Bowel sounds are normal.     Palpations: Abdomen is soft.     Comments: Her abdomen is slightly distended.  There is no obvious fluid wave.  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  She still has a dressing over the abdominal laparotomy scar.  She does not have any obvious erythema or warmth.  There is no exudate coming from the scars.  She has decent bowel sounds.  There is no palpable liver or spleen tip.  Musculoskeletal:        General: No tenderness or deformity. Normal range of motion.     Cervical back: Normal range of motion.  Lymphadenopathy:     Cervical: No cervical adenopathy.  Skin:    General: Skin is warm and dry.     Findings: No erythema or rash.  Neurological:     Mental Status: She is alert and oriented to person, place, and time.  Psychiatric:        Behavior: Behavior normal.        Thought Content: Thought content normal.        Judgment: Judgment normal.      Lab Results  Component Value Date   WBC 18.7 (H) 04/24/2022   HGB 10.7 (L) 04/24/2022   HCT 33.7 (L) 04/24/2022   MCV 99.7 04/24/2022   PLT 178 04/24/2022   Lab Results  Component Value Date   FERRITIN 288 03/17/2022   IRON 49 03/17/2022   TIBC 316 03/17/2022   UIBC 267 03/17/2022   IRONPCTSAT 16 03/17/2022   Lab Results  Component Value Date   RETICCTPCT 2.7 03/17/2022   RBC 3.38 (L) 04/24/2022   No results found for: "KPAFRELGTCHN", "LAMBDASER",  "KAPLAMBRATIO" No results found for: "IGGSERUM", "IGA", "IGMSERUM" No results found for: "TOTALPROTELP", "ALBUMINELP", "A1GS", "A2GS", "BETS", "BETA2SER", "GAMS", "MSPIKE", "SPEI"   Chemistry      Component Value Date/Time   NA 138 04/07/2022 0830   NA 139 07/12/2019 1110   K 3.6 04/07/2022 0830   CL 102 04/07/2022 0830   CO2 27 04/07/2022 0830   BUN 15 04/07/2022 0830   BUN 15 07/12/2019 1110   CREATININE 0.59 04/07/2022 0830   CREATININE 0.74 01/08/2017 1100      Component Value Date/Time   CALCIUM 8.9 04/07/2022 0830   ALKPHOS 66 04/07/2022 0830   AST 17 04/07/2022 0830   ALT 20 04/07/2022 0830   BILITOT 0.4 04/07/2022 0830       Impression and Plan: Bailey Walker is a very pleasant 76 yo [place] female with stage IIIB ([id number]) 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   She has recurrent disease.  We are treating with FOLFIRI.  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   I am happy about the CT scan.  I would think that her disease is holding pretty steady.  Again I think we could do radiosurgery for the lung nodule in the left lower lung, if this would help.  I do want to get her on Xeloda when she goes over to [place].  I do think she needs some kind of treatment while she is out of the country.  I think Xeloda would be a reasonable way to go.  We will treat her with FOLFIRI next week.  I will then plan to get her back last week of August.  At that time, I will then  talk to her about starting the Xeloda.  Hopefully, by then, we will know when she will be headed off to [place].    Volanda Napoleon, MD 8/3/20239:06 AM

## 2022-04-24 NOTE — Patient Instructions (Signed)

## 2022-04-25 ENCOUNTER — Other Ambulatory Visit: Payer: Self-pay

## 2022-04-29 ENCOUNTER — Encounter: Payer: Self-pay | Admitting: Hematology and Oncology

## 2022-04-29 ENCOUNTER — Encounter: Payer: Self-pay | Admitting: Hematology & Oncology

## 2022-04-30 ENCOUNTER — Inpatient Hospital Stay: Payer: Medicaid Other

## 2022-04-30 ENCOUNTER — Other Ambulatory Visit: Payer: Self-pay | Admitting: *Deleted

## 2022-04-30 VITALS — BP 138/70 | HR 80 | Temp 98.0°F | Resp 16

## 2022-04-30 DIAGNOSIS — Z510 Encounter for antineoplastic radiation therapy: Secondary | ICD-10-CM | POA: Diagnosis not present

## 2022-04-30 DIAGNOSIS — C772 Secondary and unspecified malignant neoplasm of intra-abdominal lymph nodes: Secondary | ICD-10-CM

## 2022-04-30 MED ORDER — TRAMADOL HCL 50 MG PO TABS: 50.0000 mg | ORAL_TABLET | Freq: Four times a day (QID) | ORAL | 3 refills | Status: DC | PRN

## 2022-04-30 MED ORDER — HEPARIN SOD (PORK) LOCK FLUSH 100 UNIT/ML IV SOLN: 500.0000 [IU] | Freq: Once | INTRAVENOUS | Status: DC | PRN

## 2022-04-30 MED ORDER — ATROPINE SULFATE 1 MG/ML IV SOLN
0.5000 mg | Freq: Once | INTRAVENOUS | Status: AC | PRN
  Administered 2022-04-30: 0.5 mg via INTRAVENOUS

## 2022-04-30 MED ORDER — SODIUM CHLORIDE 0.9 % IV SOLN: Freq: Once | INTRAVENOUS | Status: AC

## 2022-04-30 MED ORDER — FLUOROURACIL CHEMO INJECTION 2.5 GM/50ML
400.0000 mg/m2 | Freq: Once | INTRAVENOUS | Status: AC
  Administered 2022-04-30: 750 mg via INTRAVENOUS
  Filled 2022-04-30: qty 15

## 2022-04-30 MED ORDER — PALONOSETRON HCL INJECTION 0.25 MG/5ML
0.2500 mg | Freq: Once | INTRAVENOUS | Status: AC
  Administered 2022-04-30: 0.25 mg via INTRAVENOUS

## 2022-04-30 MED ORDER — SODIUM CHLORIDE 0.9 % IV SOLN
144.0000 mg/m2 | Freq: Once | INTRAVENOUS | Status: AC
  Administered 2022-04-30: 260 mg via INTRAVENOUS
  Filled 2022-04-30: qty 5

## 2022-04-30 MED ORDER — SODIUM CHLORIDE 0.9 % IV SOLN
1920.0000 mg/m2 | INTRAVENOUS | Status: DC
  Administered 2022-04-30: 3600 mg via INTRAVENOUS
  Filled 2022-04-30: qty 72

## 2022-04-30 MED ORDER — SODIUM CHLORIDE 0.9 % IV SOLN
400.0000 mg/m2 | Freq: Once | INTRAVENOUS | Status: AC
  Administered 2022-04-30: 748 mg via INTRAVENOUS
  Filled 2022-04-30: qty 25

## 2022-04-30 MED ORDER — SODIUM CHLORIDE 0.9 % IV SOLN
10.0000 mg | Freq: Once | INTRAVENOUS | Status: AC
  Administered 2022-04-30: 10 mg via INTRAVENOUS
  Filled 2022-04-30: qty 10

## 2022-04-30 MED ORDER — HYDROCODONE-ACETAMINOPHEN 5-325 MG PO TABS: 1.0000 | ORAL_TABLET | Freq: Four times a day (QID) | ORAL | 0 refills | Status: DC | PRN

## 2022-04-30 MED ORDER — SODIUM CHLORIDE 0.9% FLUSH: 10.0000 mL | INTRAVENOUS | Status: DC | PRN

## 2022-04-30 NOTE — Patient Instructions (Signed)
Ba[facility]  Discharge Instructions: Thank you for choosing [facility] to provide your oncology and hematology care.   If you have a lab appointment with the [facility], please go directly to the [facility] and check in at the registration area.  Wear comfortable clothing and clothing appropriate for easy access to any Portacath or PICC line.   We strive to give you quality time with your provider. You may need to reschedule your appointment if you arrive late (15 or more minutes).  Arriving late affects you and other patients whose appointments are after yours.  Also, if you miss three or more appointments without notifying the office, you may be dismissed from the clinic at the provider's discretion.      For prescription refill requests, have your pharmacy contact our office and allow 72 hours for refills to be completed.    Today you received the following chemotherapy and/or immunotherapy agents 5FU, :eucovorin, Irinotecan      To help prevent nausea and vomiting after your treatment, we encourage you to take your nausea medication as directed.  BELOW ARE SYMPTOMS THAT SHOULD BE REPORTED IMMEDIATELY: *FEVER GREATER THAN 100.4 F (38 C) OR HIGHER *CHILLS OR SWEATING *NAUSEA AND VOMITING THAT IS NOT CONTROLLED WITH YOUR NAUSEA MEDICATION *UNUSUAL SHORTNESS OF BREATH *UNUSUAL BRUISING OR BLEEDING *URINARY PROBLEMS (pain or burning when urinating, or frequent urination) *BOWEL PROBLEMS (unusual diarrhea, constipation, pain near the anus) TENDERNESS IN MOUTH AND THROAT WITH OR WITHOUT PRESENCE OF ULCERS (sore throat, sores in mouth, or a toothache) UNUSUAL RASH, SWELLING OR PAIN  UNUSUAL VAGINAL DISCHARGE OR ITCHING   Items with * indicate a potential emergency and should be followed up as soon as possible or go to the Emergency Department if any problems should occur.  Please show the CHEMOTHERAPY ALERT CARD or IMMUNOTHERAPY ALERT CARD at  check-in to the Emergency Department and triage nurse. Should you have questions after your visit or need to cancel or reschedule your appointment, please contact [facility]  [phone] and follow the prompts.  Office hours are 8:00 a.m. to 4:30 p.m. Monday - Friday. Please note that voicemails left after 4:00 p.m. may not be returned until the following business day.  We are closed weekends and major holidays. You have access to a nurse at all times for urgent questions. Please call the main number to the clinic [phone] and follow the prompts.  For any non-urgent questions, you may also contact your provider using MyChart. We now offer e-Visits for anyone 39 and older to request care online for non-urgent symptoms. For details visit [email or web address].   Also download the MyChart app! Go to the app store, search "MyChart", open the app, select [facility], and log in with your MyChart username and password.  Masks are optional in the cancer centers. If you would like for your care team to wear a mask while they are taking care of you, please let them know. You may have one support person who is at least 78 years old accompany you for your appointments.

## 2022-05-01 NOTE — Progress Notes (Addendum)
Thoracic Location of Tumor / Histology:  left lower lung nodule - adenocarcinoma of the sigmoid colon  Patient presented with an abdnormal CT scan.  Tobacco/Marijuana/Snuff/ETOH use: former smoker quit 10 years ago.  Past/Anticipated interventions by cardiothoracic surgery, if any: no  Past/Anticipated interventions by medical oncology, if any: FOLFIRI then Xeloda when she is in [place].  Signs/Symptoms Weight changes, if any: no Respiratory complaints, if any: occasional cough, shortness of breath with walking and with heat Hemoptysis, if any: no Pain issues, if any:  yes - in her abdomen from her colostomy - skin irritation.  Dr. Johney Maine manages her colostomy  SAFETY ISSUES: Prior radiation? no Pacemaker/ICD? no  Possible current pregnancy?no Is the patient on methotrexate? no  Current Complaints / other details:  She will be going to [place] on 05/17/22 and will be staying a month. Her niece is here with her.  BP (!) 130/57 (BP Location: Left Arm, Patient Position: Sitting)   Pulse 91   Temp 98.1 F (36.7 C) (Oral)   Resp 18   Ht '4\' 11"'$  (1.499 m)   Wt 166 lb 4 oz (75.4 kg)   SpO2 99%   BMI 33.58 kg/m

## 2022-05-02 ENCOUNTER — Inpatient Hospital Stay: Payer: Medicaid Other

## 2022-05-02 VITALS — BP 153/65 | HR 88 | Temp 98.6°F | Resp 16

## 2022-05-02 DIAGNOSIS — D509 Iron deficiency anemia, unspecified: Secondary | ICD-10-CM

## 2022-05-02 DIAGNOSIS — Z510 Encounter for antineoplastic radiation therapy: Secondary | ICD-10-CM | POA: Diagnosis not present

## 2022-05-02 DIAGNOSIS — C187 Malignant neoplasm of sigmoid colon: Secondary | ICD-10-CM

## 2022-05-02 MED ORDER — PEGFILGRASTIM-CBQV 6 MG/0.6ML ~~LOC~~ SOSY
PREFILLED_SYRINGE | SUBCUTANEOUS | Status: AC
  Filled 2022-05-02: qty 0.6

## 2022-05-02 MED ORDER — SODIUM CHLORIDE 0.9% FLUSH
10.0000 mL | INTRAVENOUS | Status: DC | PRN
  Administered 2022-05-02: 10 mL

## 2022-05-02 MED ORDER — PEGFILGRASTIM-CBQV 6 MG/0.6ML ~~LOC~~ SOSY
6.0000 mg | PREFILLED_SYRINGE | Freq: Once | SUBCUTANEOUS | Status: AC
  Administered 2022-05-02: 6 mg via SUBCUTANEOUS

## 2022-05-02 MED ORDER — HEPARIN SOD (PORK) LOCK FLUSH 100 UNIT/ML IV SOLN
500.0000 [IU] | Freq: Once | INTRAVENOUS | Status: AC | PRN
  Administered 2022-05-02: 500 [IU]

## 2022-05-02 NOTE — Patient Instructions (Signed)

## 2022-05-03 NOTE — Progress Notes (Signed)
Radiation Oncology         [phone] ________________________________  Initial Outpatient Consultation  Name: Bailey Walker MRN: [id number]  Date: 05/05/2022  DOB: 1946-06-17  NL:[id number], Raynelle Dick, MD  Volanda Napoleon, MD   REFERRING PHYSICIAN: Volanda Napoleon, MD  DIAGNOSIS: There were no encounter diagnoses.  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 female who is accompanied by ***. she is seen as a courtesy of Dr. Marin Olp for an opinion concerning radiation therapy as part of management for her recurrent metastatic colon cancer. The patient was initially diagnosed with moderately differentiated adenocarcinoma of the sigmoid colon in July of 2021: s/p open sigmoid colectomy, colostomy placement, and 8 cycles of chemotherapy consisting of FOLFOX from 06/11/2020 through 10/22/2020 under the care of Dr. Marin Olp.   In September of 2022, the patient developed severe abdominal/pelvic pain. CT scan of the abdomen and pelvis and pelvic US performed on 05/31/21 revealed an enlarging left adnexal mass abutting the uterus and left ovary concerning for metastatic disease.     PET scan on 06/24/21 showed the left adnexal mass as hypermetabolic, measuring 7.5 cm,  2 hypermetabolic right peritoneal soft tissue metastases, and a 1.5 cm solid LLL pulmonary metastasis.  Biopsy of the abdominal mass on 07/05/21 revealed metastatic adenocarcinoma with mucinous features.  Accordingly, the patient proceeded with further chemotherapy consisting of FOLFIRI on 07/31/21 per Dr. Marin Olp.   CT of the chest abdomen and pelvis on 10/10/21 showed a slight decrease in size of the RLQ peritoneal nodule. The additional areas of smaller peritoneal nodularity were seen to remain unchanged. CT also showed  stability of the lobulated mass of the left adnexa and bilateral solid pulmonary nodules (including the LLL nodule).   CT scan of the abdomen and pelvis on 12/27/21 showed relatively stable disease; demonstrated by a slight decrease in size of 1 of the pulmonary nodules and in the right upper quadrant. However, subtle nodularity was noted of the mesentery, involving the herniated small bowel loop, raising potential concern for worsening peritoneal disease.    The patient began to have problems with leukopenia from systemic therapy, and was accordingly started on Neulasta on 02/06/22 with improvement in her white counts.   Her most recent CT of the chest abdomen and pelvis on 04/05/22 showed a slight increase in size of a LLL pulmonary nodule. Otherwise, CT showed stable peritoneal metastatic disease, stability of various other pulmonary nodules, and no change to the left adnexal mass.   Accordingly, Dr. Marin Olp referred the patient to me for consideration of radiosurgery to the enlarging LLL nodule.   Of note: the patient has had numerous issues with her colostomy bag for quite some time now (mainly pain involving the stoma). She had met with gen surgery in the past who did not recommend surgical intervention. She recently met with [facility] general surgery who replaced her normal bag with a Designer, fashion/clothing.   The patient also intends on traveling to [place] to visit family in the coming months. Dr. Marin Olp would like to keep her on some form of treatment while she is abroad, likely consisting of Xeloda. The patient will return to Dr. Marin Olp at the end of this month to discuss the role of Xeloda.   PREVIOUS RADIATION THERAPY: No  PAST MEDICAL HISTORY:  Past Medical History:  Diagnosis Date   Arthritis    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 ([facility]) 04/26/2020   Cataract    Diabetes mellitus without complication (HCC)    GERD (gastroesophageal reflux  disease)    Goals of care, counseling/discussion 04/26/2020   Hyperlipidemia    Hypertension    SVT (supraventricular tachycardia) ([facility])    Thyroid disease     PAST SURGICAL HISTORY: Past Surgical History:  Procedure Laterality Date   APPENDECTOMY     BIOPSY  04/18/2020   Procedure: BIOPSY;  Surgeon: Lavena Bullion, DO;  Location: [facility];  Service: Gastroenterology;;   BIOPSY  04/19/2020   Procedure: BIOPSY;  Surgeon: Lavena Bullion, DO;  Location: [facility];  Service: Gastroenterology;;   BREAST SURGERY     Fatty tissue on biopsy   ESOPHAGOGASTRODUODENOSCOPY (EGD) WITH PROPOFOL N/A 04/18/2020   Procedure: ESOPHAGOGASTRODUODENOSCOPY (EGD) WITH PROPOFOL;  Surgeon: Lavena Bullion, DO;  Location: [facility];  Service: Gastroenterology;  Laterality: N/A;   EYE SURGERY Bilateral    april and march 2019 for cataracts.    fatty gland     left wrist , right breast   FLEXIBLE SIGMOIDOSCOPY N/A 04/18/2020   Procedure: FLEXIBLE SIGMOIDOSCOPY;  Surgeon: Lavena Bullion, DO;  Location: [facility];  Service: Gastroenterology;  Laterality: N/A;   FLEXIBLE SIGMOIDOSCOPY N/A 04/19/2020   Procedure: FLEXIBLE SIGMOIDOSCOPY;  Surgeon: Lavena Bullion, DO;  Location: [facility];  Service: Gastroenterology;  Laterality: N/A;   INCISION AND DRAINAGE OF WOUND N/A 05/01/2020   Procedure: ABDOMINAL  WOUND EXPLORATION; IRRIGATION AND DEBRIDEMENT WOUND;  Surgeon: Rolm Bookbinder, MD;  Location: [facility];  Service: General;  Laterality: N/A;   IR IMAGING GUIDED PORT INSERTION  06/13/2020   KNEE SURGERY     left knee   LAPAROTOMY N/A 04/20/2020   Procedure: SIGMOID COLECTOMY AND COLOSTOMY;  Surgeon: Coralie Keens, MD;  Location: [facility];  Service: General;  Laterality: N/A;   SUBMUCOSAL TATTOO INJECTION  04/19/2020   Procedure: SUBMUCOSAL TATTOO INJECTION;  Surgeon: Lavena Bullion, DO;  Location: [facility];  Service: Gastroenterology;;    FAMILY HISTORY:  Family History   Problem Relation Age of Onset   Breast cancer Cousin        mat and pat sides   Cancer Sister        cancer everywhere   Heart disease Brother    Colon cancer Neg Hx    Esophageal cancer Neg Hx    Rectal cancer Neg Hx     SOCIAL HISTORY:  Social History   Tobacco Use   Smoking status: Former    Types: Cigarettes    Quit date: 12/29/2013    Years since quitting: 8.3   Smokeless tobacco: Never  Vaping Use   Vaping Use: Never used  Substance Use Topics   Alcohol use: No   Drug use: No    ALLERGIES:  Allergies  Allergen Reactions   Nitroglycerin Nausea And Vomiting   Ampicillin Other (See Comments)    Per allergy test Per allergy test Per allergy test    MEDICATIONS:  Current Outpatient Medications  Medication Sig Dispense Refill   alendronate (FOSAMAX) 70 MG tablet Take 70 mg by mouth every Sunday.     aspirin EC 81 MG tablet Take 81 mg by mouth daily. Swallow whole.     atorvastatin (LIPITOR) 40 MG tablet Take 1 tablet by mouth daily.     [employer]S GENTLE LAXATIVE 5 MG EC  tablet Take 5 mg by mouth daily as needed. (Patient not taking: Reported on 02/24/2022)     [employer]S PURELAX 17 GM/SCOOP powder Take 17 g by mouth daily as needed. (Patient not taking: Reported on 02/24/2022)     dexamethasone (DECADRON) 4 MG tablet Take 2 tablets (8 mg total) by mouth daily. Start the day after chemo for 2 days. 8 tablet 5   feeding supplement, ENSURE ENLIVE, (ENSURE ENLIVE) LIQD Take 237 mLs by mouth 3 (three) times daily between meals. (Patient not taking: Reported on 02/24/2022) 237 mL 12   glipiZIDE (GLUCOTROL) 5 MG tablet TAKE 1 TABLET BY MOUTH TWICE A DAY BEFORE MEALS 60 tablet 3   HYDROcodone-acetaminophen (NORCO/VICODIN) 5-325 MG tablet Take 1 tablet by mouth every 6 (six) hours as needed for moderate pain. 60 tablet 0   KLOR-CON M20 20 MEQ tablet TAKE 1 TABLET BY MOUTH TWICE A DAY 30 tablet 4   levothyroxine (SYNTHROID) 75 MCG tablet TAKE 1 TABLET BY MOUTH EVERY DAY (Patient taking  differently: Take 75 mcg by mouth daily before breakfast.) 30 tablet 0   loperamide (IMODIUM) 2 MG capsule TAKE 2 CAPSULES (4 MG TOTAL) BY MOUTH AS NEEDED. TAKE 2 AT DIARRHEA ONSET , THEN 1 EVERY 2HR UNTIL 12HRS WITH NO BM. MAY TAKE 2 EVERY 4HRS AT NIGHT. IF DIARRHEA RECURS REPEAT. (Patient not taking: Reported on 02/24/2022) 90 capsule 2   metFORMIN (GLUCOPHAGE) 500 MG tablet Take 0.5 tablets (250 mg total) by mouth 2 (two) times daily with a meal. (Patient taking differently: Take 500 mg by mouth 2 (two) times daily with a meal.) 90 tablet 3   methocarbamol (ROBAXIN) 500 MG tablet TAKE 1 TABLET BY MOUTH EVERY 12 HOURS AS NEEDED FOR MUSCLE SPASMS. 60 tablet 2   metoprolol tartrate (LOPRESSOR) 25 MG tablet Take 1 tablet (25 mg total) by mouth 2 (two) times daily. 180 tablet 3   Multiple Vitamin (MULTIVITAMIN WITH MINERALS) TABS tablet Take 1 tablet by mouth daily. 30 tablet 0   ondansetron (ZOFRAN) 8 MG tablet TAKE 1 TABLET (8 MG TOTAL) BY MOUTH 2 (TWO) TIMES DAILY AS NEEDED FOR REFRACTORY NAUSEA / VOMITING. START ON DAY 3 AFTER CHEMOTHERAPY. (Patient not taking: Reported on 02/24/2022) 30 tablet 1   pantoprazole (PROTONIX) 20 MG tablet Take 1 tablet (20 mg total) by mouth daily. 30 tablet 3   pantoprazole (PROTONIX) 20 MG tablet Take 1 tablet by mouth daily.     prednisoLONE acetate (PRED FORTE) 1 % ophthalmic suspension Place 1 drop into both eyes every Monday.     prochlorperazine (COMPAZINE) 10 MG tablet Take 1 tablet (10 mg total) by mouth every 6 (six) hours as needed (NAUSEA). (Patient not taking: Reported on 02/24/2022) 30 tablet 1   silver sulfADIAZINE (SILVADENE) 1 % cream Apply 1 application topically daily. (Patient not taking: Reported on 02/24/2022) 50 g 2   traMADol (ULTRAM) 50 MG tablet Take 1 tablet (50 mg total) by mouth every 6 (six) hours as needed. 60 tablet 3   Vitamin D, Ergocalciferol, (DRISDOL) 1.25 MG (50000 UNIT) CAPS capsule Take 50,000 Units by mouth once a week. (Patient not  taking: Reported on 02/24/2022)     No current facility-administered medications for this encounter.   Facility-Administered Medications Ordered in Other Encounters  Medication Dose Route Frequency Provider Last Rate Last Admin   0.9 %  sodium chloride infusion   Intravenous Once Ennever, Rose Phi, MD       0.9 %  sodium chloride infusion  Intravenous Once Volanda Napoleon, MD       clindamycin (CLEOCIN) 900 mg in dextrose 5 % 50 mL IVPB  900 mg Intravenous 60 min Pre-Op Michael Boston, MD       And   gentamicin (GARAMYCIN) 5 mg/kg in dextrose 5 % 50 mL IVPB  5 mg/kg Intravenous 60 min Pre-Op Michael Boston, MD       pegfilgrastim-cbqv [facility]) 6 MG/0.6ML injection             REVIEW OF SYSTEMS:  A 10+ POINT REVIEW OF SYSTEMS WAS OBTAINED including neurology, dermatology, psychiatry, cardiac, respiratory, lymph, extremities, GI, GU, musculoskeletal, constitutional, reproductive, HEENT. ***   PHYSICAL EXAM:  vitals were not taken for this visit.   General: Alert and oriented, in no acute distress HEENT: Head is normocephalic. Extraocular movements are intact. Oropharynx is clear. Neck: Neck is supple, no palpable cervical or supraclavicular lymphadenopathy. Heart: Regular in rate and rhythm with no murmurs, rubs, or gallops. Chest: Clear to auscultation bilaterally, with no rhonchi, wheezes, or rales. Abdomen: Soft, nontender, nondistended, with no rigidity or guarding. Extremities: No cyanosis or edema. Lymphatics: see Neck Exam Skin: No concerning lesions. Musculoskeletal: symmetric strength and muscle tone throughout. Neurologic: Cranial nerves II through XII are grossly intact. No obvious focalities. Speech is fluent. Coordination is intact. Psychiatric: Judgment and insight are intact. Affect is appropriate. ***  ECOG = ***  0 - Asymptomatic (Fully active, able to carry on all predisease activities without restriction)  1 - Symptomatic but completely ambulatory (Restricted in  physically strenuous activity but ambulatory and able to carry out work of a light or sedentary nature. For example, light housework, office work)  2 - Symptomatic, <50% in bed during the day (Ambulatory and capable of all self care but unable to carry out any work activities. Up and about more than 50% of waking hours)  3 - Symptomatic, >50% in bed, but not bedbound (Capable of only limited self-care, confined to bed or chair 50% or more of waking hours)  4 - Bedbound (Completely disabled. Cannot carry on any self-care. Totally confined to bed or chair)  5 - Death   Eustace Pen MM, Creech RH, Tormey DC, et al. [phone]). "Toxicity and response criteria of the [facility]". [place] Oncol. 5 (6): 649-55  LABORATORY DATA:  Lab Results  Component Value Date   WBC 18.7 (H) 04/24/2022   HGB 10.7 (L) 04/24/2022   HCT 33.7 (L) 04/24/2022   MCV 99.7 04/24/2022   PLT 178 04/24/2022   NEUTROABS 12.4 (H) 04/24/2022   Lab Results  Component Value Date   NA 140 04/24/2022   K 3.5 04/24/2022   CL 104 04/24/2022   CO2 25 04/24/2022   GLUCOSE 148 (H) 04/24/2022   BUN 7 (L) 04/24/2022   CREATININE 0.66 04/24/2022   CALCIUM 8.9 04/24/2022      RADIOGRAPHY: CT CHEST ABDOMEN PELVIS W CONTRAST  Result Date: 04/07/2022 CLINICAL DATA:  Colon cancer; * Tracking Code: BO * EXAM: CT CHEST, ABDOMEN, AND PELVIS WITH CONTRAST TECHNIQUE: Multidetector CT imaging of the chest, abdomen and pelvis was performed following the standard protocol during bolus administration of intravenous contrast. RADIATION DOSE REDUCTION: This exam was performed according to the departmental dose-optimization program which includes automated exposure control, adjustment of the mA and/or kV according to patient size and/or use of iterative reconstruction technique. CONTRAST:  141mL OMNIPAQUE IOHEXOL 300 MG/ML  SOLN COMPARISON:  CT chest, abdomen and pelvis dated December 27, 2021 FINDINGS: CT CHEST FINDINGS  Cardiovascular: Normal heart size. No pericardial effusion. Moderate three-vessel coronary artery calcifications. Normal caliber thoracic aorta with atherosclerotic disease. Mediastinum/Nodes: Small hiatal hernia. Stable left calcified thyroid nodule. Stable well-circumscribed soft tissue nodule of the left breast located on series 2, image 34, likely a fibroadenoma. No pathologically enlarged lymph nodes seen in the chest. Lungs/Pleura: Central airways are patent. Solid nodule of the superior portion of the left lower lobe measuring 1.7 x 1.1 cm on series 3, image 56, previously 1.4 x 0.9 cm. Solid multilobulated nodule of the right upper lobe measures up to 1.8 cm on series 3 image 29, unchanged. Stable small solid nodule of the left upper lobe measuring 3 mm on series 3, image 65. Musculoskeletal: No chest wall mass or suspicious bone lesions identified. CT ABDOMEN PELVIS FINDINGS Hepatobiliary: Hepatic steatosis. Stable arterially enhancing lesion of the right lobe measuring 6 mm on series 2, image 69. No new liver lesions. Gallbladder is unremarkable. No biliary ductal dilation. Pancreas: Fatty atrophy of the pancreas. No pancreatic ductal dilation. Spleen: Normal in size without focal abnormality. Adrenals/Urinary Tract: Bilateral adrenal glands are unremarkable. Kidneys enhance symmetrically with no evidence of hydronephrosis or nephrolithiasis. Bladder is decompressed. Stomach/Bowel: Prior partial colectomy with left lower quadrant colostomy and moderate parastomal hernia containing nondilated loops of small bowel. No bowel wall thickening, inflammatory change, or evidence of obstruction. Vascular/Lymphatic: Aortic atherosclerosis. No enlarged abdominal or pelvic lymph nodes. Reproductive: Multiloculated cystic lesion of the left adnexa measuring approximally 5.7 x 3.2 cm, unchanged when compared to prior exam and remeasured in similar plane. Other: Stable peritoneal nodularity and mesenteric thickening.  Reference nodule of the right hemiabdomen measuring 1.9 x 1.6 cm on series 4, image 120, previously 1.8 x 1.8 cm, not significantly changed. No abdominopelvic ascites. Rectus diastasis. Musculoskeletal: No acute or significant osseous findings. IMPRESSION: 1. Nodule of the superior portion of the left lower lobe is slightly increased in size, other previously seen pulmonary nodules are stable. 2. Stable peritoneal metastatic disease. 3. Stable lobulated mass of the left adnexa. 4.  Aortic Atherosclerosis (ICD10-I70.0). Electronically Signed   By: Yetta Glassman M.D.   On: 04/07/2022 11:19      IMPRESSION: Stage IIIB ([id number]) metastatic adenocarcinoma of the sigmoid colon; adnexal mass, and metastatic disease to the lung and an intra-abdominal lymph node - s/p sigmoid colectomy and chemotherapy  ***  Today, I talked to the patient and family about the findings and work-up thus far.  We discussed the natural history of *** and general treatment, highlighting the role of radiotherapy in the management.  We discussed the available radiation techniques, and focused on the details of logistics and delivery.  We reviewed the anticipated acute and late sequelae associated with radiation in this setting.  The patient was encouraged to ask questions that I answered to the best of my ability. *** A patient consent form was discussed and signed.  We retained a copy for our records.  The patient would like to proceed with radiation and will be scheduled for CT simulation.  PLAN: ***    *** minutes of total time was spent for this patient encounter, including preparation, face-to-face counseling with the patient and coordination of care, physical exam, and documentation of the encounter.   ------------------------------------------------  Blair Promise, PhD, MD  This document serves as a record of services personally performed by Gery Pray, MD. It was created on his behalf by Roney Mans, a trained  medical scribe. The creation  of this record is based on the scribe's personal observations and the provider's statements to them. This document has been checked and approved by the attending provider.

## 2022-05-05 ENCOUNTER — Other Ambulatory Visit: Payer: Self-pay

## 2022-05-05 ENCOUNTER — Ambulatory Visit
Admission: RE | Admit: 2022-05-05 | Discharge: 2022-05-05 | Disposition: A | Discharge status: Home / Self Care | Payer: Medicaid Other | Source: Ambulatory Visit | Attending: Radiation Oncology | Admitting: Radiation Oncology

## 2022-05-05 ENCOUNTER — Encounter: Payer: Self-pay | Admitting: Radiation Oncology

## 2022-05-05 VITALS — BP 130/57 | HR 91 | Temp 98.1°F | Resp 18 | Ht 59.0 in | Wt 166.2 lb

## 2022-05-05 DIAGNOSIS — Z79899 Other long term (current) drug therapy: Secondary | ICD-10-CM | POA: Insufficient documentation

## 2022-05-05 DIAGNOSIS — E119 Type 2 diabetes mellitus without complications: Secondary | ICD-10-CM | POA: Insufficient documentation

## 2022-05-05 DIAGNOSIS — Z7982 Long term (current) use of aspirin: Secondary | ICD-10-CM | POA: Insufficient documentation

## 2022-05-05 DIAGNOSIS — R911 Solitary pulmonary nodule: Secondary | ICD-10-CM

## 2022-05-05 DIAGNOSIS — Z7952 Long term (current) use of systemic steroids: Secondary | ICD-10-CM | POA: Insufficient documentation

## 2022-05-05 DIAGNOSIS — C772 Secondary and unspecified malignant neoplasm of intra-abdominal lymph nodes: Secondary | ICD-10-CM | POA: Insufficient documentation

## 2022-05-05 DIAGNOSIS — I10 Essential (primary) hypertension: Secondary | ICD-10-CM | POA: Diagnosis not present

## 2022-05-05 DIAGNOSIS — E785 Hyperlipidemia, unspecified: Secondary | ICD-10-CM | POA: Insufficient documentation

## 2022-05-05 DIAGNOSIS — K219 Gastro-esophageal reflux disease without esophagitis: Secondary | ICD-10-CM | POA: Diagnosis not present

## 2022-05-05 DIAGNOSIS — I471 Supraventricular tachycardia: Secondary | ICD-10-CM | POA: Diagnosis not present

## 2022-05-05 DIAGNOSIS — Z87891 Personal history of nicotine dependence: Secondary | ICD-10-CM | POA: Diagnosis not present

## 2022-05-05 DIAGNOSIS — Z933 Colostomy status: Secondary | ICD-10-CM | POA: Diagnosis not present

## 2022-05-05 DIAGNOSIS — C187 Malignant neoplasm of sigmoid colon: Secondary | ICD-10-CM | POA: Insufficient documentation

## 2022-05-05 DIAGNOSIS — C786 Secondary malignant neoplasm of retroperitoneum and peritoneum: Secondary | ICD-10-CM | POA: Insufficient documentation

## 2022-05-05 DIAGNOSIS — Z79890 Hormone replacement therapy (postmenopausal): Secondary | ICD-10-CM | POA: Insufficient documentation

## 2022-05-05 DIAGNOSIS — Z7984 Long term (current) use of oral hypoglycemic drugs: Secondary | ICD-10-CM | POA: Diagnosis not present

## 2022-05-06 ENCOUNTER — Other Ambulatory Visit: Payer: Self-pay | Admitting: *Deleted

## 2022-05-06 MED ORDER — PANTOPRAZOLE SODIUM 20 MG PO TBEC: 20.0000 mg | DELAYED_RELEASE_TABLET | Freq: Two times a day (BID) | ORAL | 3 refills | Status: DC

## 2022-05-06 MED ORDER — PANTOPRAZOLE SODIUM 20 MG PO TBEC: 20.0000 mg | DELAYED_RELEASE_TABLET | Freq: Every day | ORAL | 3 refills | Status: DC

## 2022-05-07 ENCOUNTER — Ambulatory Visit
Admission: RE | Admit: 2022-05-07 | Discharge: 2022-05-07 | Disposition: A | Discharge status: Home / Self Care | Payer: Medicaid Other | Source: Ambulatory Visit | Attending: Radiation Oncology | Admitting: Radiation Oncology

## 2022-05-07 ENCOUNTER — Other Ambulatory Visit: Payer: Self-pay

## 2022-05-07 DIAGNOSIS — Z510 Encounter for antineoplastic radiation therapy: Secondary | ICD-10-CM | POA: Diagnosis not present

## 2022-05-07 DIAGNOSIS — C187 Malignant neoplasm of sigmoid colon: Secondary | ICD-10-CM

## 2022-05-08 ENCOUNTER — Ambulatory Visit: Payer: Medicaid Other | Admitting: Radiation Oncology

## 2022-05-08 DIAGNOSIS — Z510 Encounter for antineoplastic radiation therapy: Secondary | ICD-10-CM | POA: Diagnosis not present

## 2022-05-10 ENCOUNTER — Other Ambulatory Visit: Payer: Self-pay

## 2022-05-13 ENCOUNTER — Other Ambulatory Visit: Payer: Self-pay

## 2022-05-13 ENCOUNTER — Ambulatory Visit
Admission: RE | Admit: 2022-05-13 | Discharge: 2022-05-13 | Disposition: A | Discharge status: Home / Self Care | Payer: Medicaid Other | Source: Ambulatory Visit | Attending: Radiation Oncology | Admitting: Radiation Oncology

## 2022-05-13 DIAGNOSIS — C772 Secondary and unspecified malignant neoplasm of intra-abdominal lymph nodes: Secondary | ICD-10-CM

## 2022-05-13 DIAGNOSIS — Z510 Encounter for antineoplastic radiation therapy: Secondary | ICD-10-CM | POA: Diagnosis not present

## 2022-05-13 LAB — RAD ONC ARIA SESSION SUMMARY
Course Elapsed Days: 0
Plan Fractions Treated to Date: 1
Plan Prescribed Dose Per Fraction: 18 Gy
Plan Total Fractions Prescribed: 3
Plan Total Prescribed Dose: 54 Gy
Reference Point Dosage Given to Date: 18 Gy
Reference Point Session Dosage Given: 18 Gy
Session Number: 1

## 2022-05-14 ENCOUNTER — Other Ambulatory Visit: Payer: Self-pay

## 2022-05-14 ENCOUNTER — Encounter (HOSPITAL_BASED_OUTPATIENT_CLINIC_OR_DEPARTMENT_OTHER): Payer: Self-pay

## 2022-05-14 ENCOUNTER — Inpatient Hospital Stay (HOSPITAL_BASED_OUTPATIENT_CLINIC_OR_DEPARTMENT_OTHER): Payer: Medicaid Other | Admitting: Hematology & Oncology

## 2022-05-14 ENCOUNTER — Encounter: Payer: Self-pay | Admitting: Hematology & Oncology

## 2022-05-14 ENCOUNTER — Inpatient Hospital Stay: Payer: Medicaid Other

## 2022-05-14 VITALS — BP 139/63 | HR 79 | Temp 97.9°F | Resp 17 | Wt 167.0 lb

## 2022-05-14 DIAGNOSIS — G8929 Other chronic pain: Secondary | ICD-10-CM | POA: Diagnosis not present

## 2022-05-14 DIAGNOSIS — M79604 Pain in right leg: Secondary | ICD-10-CM | POA: Diagnosis not present

## 2022-05-14 DIAGNOSIS — Z7982 Long term (current) use of aspirin: Secondary | ICD-10-CM | POA: Diagnosis not present

## 2022-05-14 DIAGNOSIS — S90931A Unspecified superficial injury of right great toe, initial encounter: Secondary | ICD-10-CM | POA: Diagnosis present

## 2022-05-14 DIAGNOSIS — C772 Secondary and unspecified malignant neoplasm of intra-abdominal lymph nodes: Secondary | ICD-10-CM | POA: Diagnosis not present

## 2022-05-14 DIAGNOSIS — C187 Malignant neoplasm of sigmoid colon: Secondary | ICD-10-CM | POA: Diagnosis not present

## 2022-05-14 DIAGNOSIS — S91211A Laceration without foreign body of right great toe with damage to nail, initial encounter: Secondary | ICD-10-CM | POA: Diagnosis not present

## 2022-05-14 DIAGNOSIS — W133XXA Fall through floor, initial encounter: Secondary | ICD-10-CM | POA: Diagnosis not present

## 2022-05-14 DIAGNOSIS — Z79899 Other long term (current) drug therapy: Secondary | ICD-10-CM | POA: Diagnosis not present

## 2022-05-14 DIAGNOSIS — H9209 Otalgia, unspecified ear: Secondary | ICD-10-CM | POA: Diagnosis not present

## 2022-05-14 DIAGNOSIS — Z510 Encounter for antineoplastic radiation therapy: Secondary | ICD-10-CM | POA: Diagnosis not present

## 2022-05-14 LAB — CMP (CANCER CENTER ONLY)
ALT: 24 U/L (ref 0–44)
AST: 19 U/L (ref 15–41)
Albumin: 4 g/dL (ref 3.5–5.0)
Alkaline Phosphatase: 75 U/L (ref 38–126)
Anion gap: 10 (ref 5–15)
BUN: 14 mg/dL (ref 8–23)
CO2: 25 mmol/L (ref 22–32)
Calcium: 9 mg/dL (ref 8.9–10.3)
Chloride: 105 mmol/L (ref 98–111)
Creatinine: 0.55 mg/dL (ref 0.44–1.00)
GFR, Estimated: >60 mL/min (ref >60)
Glucose, Bld: 135 mg/dL — ABNORMAL HIGH (ref 70–99)
Potassium: 3 mmol/L — ABNORMAL LOW (ref 3.5–5.1)
Sodium: 140 mmol/L (ref 135–145)
Total Bilirubin: 0.4 mg/dL (ref 0.3–1.2)
Total Protein: 6.7 g/dL (ref 6.5–8.1)

## 2022-05-14 LAB — RETICULOCYTES
Immature Retic Fract: 32.9 % — ABNORMAL HIGH (ref 2.3–15.9)
RBC.: 3.4 MIL/uL — ABNORMAL LOW (ref 3.87–5.11)
Retic Count, Absolute: 113.9 10*3/uL (ref 19.0–186.0)
Retic Ct Pct: 3.4 % — ABNORMAL HIGH (ref 0.4–3.1)

## 2022-05-14 LAB — CBC WITH DIFFERENTIAL (CANCER CENTER ONLY)
Abs Immature Granulocytes: 0.26 10*3/uL — ABNORMAL HIGH (ref 0.00–0.07)
Basophils Absolute: 0.1 10*3/uL (ref 0.0–0.1)
Basophils Relative: 1 %
Eosinophils Absolute: 0.4 10*3/uL (ref 0.0–0.5)
Eosinophils Relative: 3 %
HCT: 33.1 % — ABNORMAL LOW (ref 36.0–46.0)
Hemoglobin: 10.6 g/dL — ABNORMAL LOW (ref 12.0–15.0)
Immature Granulocytes: 2 %
Lymphocytes Relative: 17 %
Lymphs Abs: 2.2 10*3/uL (ref 0.7–4.0)
MCH: 31.6 pg (ref 26.0–34.0)
MCHC: 32 g/dL (ref 30.0–36.0)
MCV: 98.8 fL (ref 80.0–100.0)
Monocytes Absolute: 1 10*3/uL (ref 0.1–1.0)
Monocytes Relative: 8 %
Neutro Abs: 8.6 10*3/uL — ABNORMAL HIGH (ref 1.7–7.7)
Neutrophils Relative %: 69 %
Platelet Count: 230 10*3/uL (ref 150–400)
RBC: 3.35 MIL/uL — ABNORMAL LOW (ref 3.87–5.11)
RDW: 14.3 % (ref 11.5–15.5)
WBC Count: 12.6 10*3/uL — ABNORMAL HIGH (ref 4.0–10.5)
nRBC: 0.2 % (ref 0.0–0.2)

## 2022-05-14 LAB — FERRITIN: Ferritin: 255 ng/mL (ref 11–307)

## 2022-05-14 LAB — IRON AND IRON BINDING CAPACITY (CC-WL,HP ONLY)
Iron: 52 ug/dL (ref 28–170)
Saturation Ratios: 17 % (ref 10.4–31.8)
TIBC: 309 ug/dL (ref 250–450)
UIBC: 257 ug/dL (ref 148–442)

## 2022-05-14 LAB — CEA (IN HOUSE-CHCC): CEA (CHCC-In House): 16.18 ng/mL — ABNORMAL HIGH (ref 0.00–5.00)

## 2022-05-14 MED ORDER — SODIUM CHLORIDE 0.9 % IV SOLN: 144.0000 mg/m2 | Freq: Once | INTRAVENOUS | Status: DC

## 2022-05-14 MED ORDER — SODIUM CHLORIDE 0.9 % IV SOLN: 10.0000 mg | Freq: Once | INTRAVENOUS | Status: DC

## 2022-05-14 MED ORDER — SODIUM CHLORIDE 0.9 % IV SOLN: 400.0000 mg/m2 | Freq: Once | INTRAVENOUS | Status: DC

## 2022-05-14 MED ORDER — FLUOROURACIL CHEMO INJECTION 2.5 GM/50ML: 400.0000 mg/m2 | Freq: Once | INTRAVENOUS | Status: DC

## 2022-05-14 MED ORDER — PALONOSETRON HCL INJECTION 0.25 MG/5ML: 0.2500 mg | Freq: Once | INTRAVENOUS | Status: DC

## 2022-05-14 MED ORDER — ATROPINE SULFATE 1 MG/ML IV SOLN: 0.5000 mg | Freq: Once | INTRAVENOUS | Status: DC | PRN

## 2022-05-14 MED ORDER — SODIUM CHLORIDE 0.9 % IV SOLN: 1920.0000 mg/m2 | INTRAVENOUS | Status: DC

## 2022-05-14 MED ORDER — SODIUM CHLORIDE 0.9 % IV SOLN: Freq: Once | INTRAVENOUS | Status: DC

## 2022-05-14 MED ORDER — CAPECITABINE 500 MG PO TABS
1000.0000 mg/m2 | ORAL_TABLET | Freq: Two times a day (BID) | ORAL | 0 refills | Status: DC
Start: 2022-06-09 — End: 2022-05-15
  Filled 2022-05-14: qty 112, 21d supply, fill #0

## 2022-05-14 NOTE — Progress Notes (Signed)
Patient added on for treatment today, however per Theadora Rama, pharmacist we do not have adequate dose of Camptosar on site. Dose ordered but will not be received until late afternoon. Patient to return tomorrow am for tx. Patient and niece [name] aware and agree with plan.

## 2022-05-14 NOTE — ED Triage Notes (Signed)
Pt was getting out of toilet and she slipped and fell. Pt injured her RT toe and nail is almost off. Pt has bruising to RT upper leg. Pt fell on her bottom and she is now having bilateral hip and sacral pain. Did not hit head.

## 2022-05-14 NOTE — Progress Notes (Signed)
Hematology and Oncology Follow Up Visit  September Mormile [id number] 07-05-1946 76 y.o. 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 Holdsworth is here today for follow-up.  She actually has started the stereotactic radiosurgery for this left lower lung nodule.  I think she has 2 more treatments left.  Sound like she will be going to [place] on September 1.  She will be there for 2 months.  Given the fact that her blood counts are okay right now, we will go ahead with her last cycle of chemotherapy before she goes over to [place].  I think that we will then have to get her on Xeloda while she is over in [place].  I think this would be a appropriate way for her to be treated while she is out of the country.  She feels okay.  She still has problems with the colostomy bag.  There is still some discomfort.  Where she has had the surgical wound, this seems to be healing up nicely.  Her CEA level today was 16.  We will have to be very cautious with this.  She is eating okay.  She is having no cough or shortness of breath.  She is having no headaches.  There is been no leg swelling.  Her iron studies today show ferritin of 255 with an iron saturation of 17%.  We will give her a dose of IV iron.  She has had no fever.  There has been no obvious bleeding.  Overall, I would have said that her performance status is probably ECOG 1.     Medications:  Allergies as of 05/14/2022       Reactions   Nitroglycerin Nausea And Vomiting   Ampicillin Other (See Comments)   Per allergy  test Per allergy test Per allergy test        Medication List        Accurate as of May 14, 2022  8:12 AM. If you have any questions, ask your nurse or doctor.          alendronate 70 MG tablet Commonly known as: FOSAMAX Take 70 mg by mouth every Sunday.   aspirin EC 81 MG tablet Take 81 mg by mouth daily. Swallow whole.   atorvastatin 40 MG tablet Commonly known as: LIPITOR Take 1 tablet by mouth daily.   CVS Gentle Laxative 5 MG EC tablet Generic drug: bisacodyl Take 5 mg by mouth daily as needed.   CVS Purelax 17 GM/SCOOP powder Generic drug: polyethylene glycol powder Take 17 g by mouth daily as needed.   dexamethasone 4 MG tablet Commonly known as: DECADRON Take 2 tablets (8 mg total) by mouth daily. Start the day after chemo for 2 days.   feeding supplement Liqd Take 237 mLs by mouth 3 (three) times daily between meals.   glipiZIDE 5 MG tablet Commonly known as: GLUCOTROL TAKE 1 TABLET BY MOUTH TWICE A DAY BEFORE MEALS   HYDROcodone-acetaminophen 5-325 MG tablet Commonly known as: NORCO/VICODIN Take 1 tablet by mouth every 6 (six)  hours as needed for moderate pain.   Klor-Con M20 20 MEQ tablet Generic drug: potassium chloride SA TAKE 1 TABLET BY MOUTH TWICE A DAY   levothyroxine 75 MCG tablet Commonly known as: SYNTHROID TAKE 1 TABLET BY MOUTH EVERY DAY   loperamide 2 MG capsule Commonly known as: IMODIUM TAKE 2 CAPSULES (4 MG TOTAL) BY MOUTH AS NEEDED. TAKE 2 AT DIARRHEA ONSET , THEN 1 EVERY 2HR UNTIL 12HRS WITH NO BM. MAY TAKE 2 EVERY 4HRS AT NIGHT. IF DIARRHEA RECURS REPEAT.   metFORMIN 500 MG tablet Commonly known as: GLUCOPHAGE Take 0.5 tablets (250 mg total) by mouth 2 (two) times daily with a meal. What changed: how much to take   methocarbamol 500 MG tablet Commonly known as: ROBAXIN TAKE 1 TABLET BY MOUTH EVERY 12 HOURS AS NEEDED FOR MUSCLE SPASMS.   metoprolol tartrate 25 MG tablet Commonly known as: LOPRESSOR Take 1 tablet  (25 mg total) by mouth 2 (two) times daily.   multivitamin with minerals Tabs tablet Take 1 tablet by mouth daily.   ondansetron 8 MG tablet Commonly known as: ZOFRAN TAKE 1 TABLET (8 MG TOTAL) BY MOUTH 2 (TWO) TIMES DAILY AS NEEDED FOR REFRACTORY NAUSEA / VOMITING. START ON DAY 3 AFTER CHEMOTHERAPY.   pantoprazole 20 MG tablet Commonly known as: PROTONIX Take 1 tablet (20 mg total) by mouth 2 (two) times daily.   prednisoLONE acetate 1 % ophthalmic suspension Commonly known as: PRED FORTE Place 1 drop into both eyes every Monday.   prochlorperazine 10 MG tablet Commonly known as: COMPAZINE Take 1 tablet (10 mg total) by mouth every 6 (six) hours as needed (NAUSEA).   silver sulfADIAZINE 1 % cream Commonly known as: Silvadene Apply 1 application topically daily.   traMADol 50 MG tablet Commonly known as: ULTRAM Take 1 tablet (50 mg total) by mouth every 6 (six) hours as needed.   Vitamin D (Ergocalciferol) 1.25 MG (50000 UNIT) Caps capsule Commonly known as: DRISDOL Take 50,000 Units by mouth once a week.        Allergies:  Allergies  Allergen Reactions   Nitroglycerin Nausea And Vomiting   Ampicillin Other (See Comments)    Per allergy test Per allergy test Per allergy test    Past Medical History, Surgical history, Social history, and Family History were reviewed and updated.  Review of Systems: Review of Systems  HENT: Negative.    Eyes: Negative.   Respiratory:  Positive for shortness of breath.   Cardiovascular: Negative.   Gastrointestinal:  Positive for abdominal pain.  Genitourinary: Negative.   Musculoskeletal:  Positive for back pain.  Skin: Negative.   Neurological: Negative.   Endo/Heme/Allergies: Negative.   Psychiatric/Behavioral: Negative.       Physical Exam:  vitals were not taken for this visit.   Wt Readings from Last 3 Encounters:  05/05/22 166 lb 4 oz (75.4 kg)  04/24/22 172 lb (78 kg)  04/07/22 170 lb (77.1 kg)     Physical Exam Vitals reviewed.  HENT:     Head: Normocephalic and atraumatic.  Eyes:     Pupils: Pupils are equal, round, and reactive to light.  Cardiovascular:     Rate and Rhythm: Normal rate and regular rhythm.     Heart sounds: Normal heart sounds.  Pulmonary:     Effort: Pulmonary effort is normal.     Breath sounds: Normal breath sounds.  Abdominal:     General: Bowel sounds are normal.     Palpations: Abdomen is  soft.     Comments: Her abdomen is slightly distended.  There is no obvious fluid wave.  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  She still has a dressing over the abdominal laparotomy scar.  She does not have any obvious erythema or warmth.  There is no exudate coming from the scars.  She has decent bowel sounds.  There is no palpable liver or spleen tip.  Musculoskeletal:        General: No tenderness or deformity. Normal range of motion.     Cervical back: Normal range of motion.  Lymphadenopathy:     Cervical: No cervical adenopathy.  Skin:    General: Skin is warm and dry.     Findings: No erythema or rash.  Neurological:     Mental Status: She is alert and oriented to person, place, and time.  Psychiatric:        Behavior: Behavior normal.        Thought Content: Thought content normal.        Judgment: Judgment normal.     Lab Results  Component Value Date   WBC 18.7 (H) 04/24/2022   HGB 10.7 (L) 04/24/2022   HCT 33.7 (L) 04/24/2022   MCV 99.7 04/24/2022   PLT 178 04/24/2022   Lab Results  Component Value Date   FERRITIN 271 04/24/2022   IRON 104 04/24/2022   TIBC 300 04/24/2022   UIBC 196 04/24/2022   IRONPCTSAT 35 (H) 04/24/2022   Lab Results  Component Value Date   RETICCTPCT 2.7 03/17/2022   RBC 3.38 (L) 04/24/2022   No results found for: "KPAFRELGTCHN", "LAMBDASER", "KAPLAMBRATIO" No results found for: "IGGSERUM", "IGA", "IGMSERUM" No results found for: "TOTALPROTELP", "ALBUMINELP", "A1GS", "A2GS", "BETS", "BETA2SER",  "GAMS", "MSPIKE", "SPEI"   Chemistry      Component Value Date/Time   NA 140 04/24/2022 0850   NA 139 07/12/2019 1110   K 3.5 04/24/2022 0850   CL 104 04/24/2022 0850   CO2 25 04/24/2022 0850   BUN 7 (L) 04/24/2022 0850   BUN 15 07/12/2019 1110   CREATININE 0.66 04/24/2022 0850   CREATININE 0.74 01/08/2017 1100      Component Value Date/Time   CALCIUM 8.9 04/24/2022 0850   ALKPHOS 85 04/24/2022 0850   AST 19 04/24/2022 0850   ALT 21 04/24/2022 0850   BILITOT 0.4 04/24/2022 0850       Impression and Plan: Ms. 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   She has recurrent disease.  We are treating with FOLFIRI.  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   Again, this will be her last chemotherapy dose which is IV.  After this, we will then treat her with Xeloda while she is over in [place].  We have not yet had to use Avastin.  I suspect that when she returns, we may have to get her back on treatment with Avastin.  I have tried to avoid Avastin just because of the slow healing that she has had with this surgical wound.  I just wanted to have a wonderful time over and [place].  I know that she will be with family.  Hopefully, she will not have any problems with respect to the Xeloda.  I will make sure that she has enough to take with her while she is over in [place].  I would like  to have her take the Xeloda 2 weeks on and 1 week off.  I am unsure how we can get her to have blood work done over in [place].  I will have to see if maybe one of her family members could make some arrangement to have blood work done.  We will plan to see her back upon her return.  It sounds like she will be back maybe in November.   Volanda Napoleon, MD 8/23/20238:12 AM

## 2022-05-14 NOTE — Patient Instructions (Signed)

## 2022-05-15 ENCOUNTER — Other Ambulatory Visit: Payer: Self-pay

## 2022-05-15 ENCOUNTER — Other Ambulatory Visit (HOSPITAL_COMMUNITY): Payer: Self-pay

## 2022-05-15 ENCOUNTER — Ambulatory Visit
Admission: RE | Admit: 2022-05-15 | Discharge: 2022-05-15 | Disposition: A | Discharge status: Home / Self Care | Payer: Medicaid Other | Source: Ambulatory Visit | Attending: Radiation Oncology | Admitting: Radiation Oncology

## 2022-05-15 ENCOUNTER — Emergency Department (HOSPITAL_BASED_OUTPATIENT_CLINIC_OR_DEPARTMENT_OTHER)
Admission: EM | Admit: 2022-05-15 | Discharge: 2022-05-15 | Disposition: A | Discharge status: Home / Self Care | Payer: Medicaid Other | Attending: Emergency Medicine | Admitting: Emergency Medicine

## 2022-05-15 ENCOUNTER — Telehealth: Payer: Self-pay | Admitting: Pharmacy Technician

## 2022-05-15 ENCOUNTER — Telehealth: Payer: Self-pay | Admitting: Pharmacist

## 2022-05-15 ENCOUNTER — Emergency Department (HOSPITAL_BASED_OUTPATIENT_CLINIC_OR_DEPARTMENT_OTHER): Payer: Medicaid Other

## 2022-05-15 ENCOUNTER — Inpatient Hospital Stay: Payer: Medicaid Other

## 2022-05-15 VITALS — BP 156/72 | HR 94 | Temp 97.7°F | Resp 19

## 2022-05-15 DIAGNOSIS — Z510 Encounter for antineoplastic radiation therapy: Secondary | ICD-10-CM | POA: Diagnosis not present

## 2022-05-15 DIAGNOSIS — C772 Secondary and unspecified malignant neoplasm of intra-abdominal lymph nodes: Secondary | ICD-10-CM

## 2022-05-15 DIAGNOSIS — M7918 Myalgia, other site: Secondary | ICD-10-CM

## 2022-05-15 DIAGNOSIS — S91209A Unspecified open wound of unspecified toe(s) with damage to nail, initial encounter: Secondary | ICD-10-CM

## 2022-05-15 DIAGNOSIS — D509 Iron deficiency anemia, unspecified: Secondary | ICD-10-CM

## 2022-05-15 DIAGNOSIS — W19XXXA Unspecified fall, initial encounter: Secondary | ICD-10-CM

## 2022-05-15 LAB — RAD ONC ARIA SESSION SUMMARY
Course Elapsed Days: 2
Plan Fractions Treated to Date: 2
Plan Prescribed Dose Per Fraction: 18 Gy
Plan Total Fractions Prescribed: 3
Plan Total Prescribed Dose: 54 Gy
Reference Point Dosage Given to Date: 36 Gy
Reference Point Session Dosage Given: 18 Gy
Session Number: 2

## 2022-05-15 MED ORDER — SODIUM CHLORIDE 0.9 % IV SOLN
144.0000 mg/m2 | Freq: Once | INTRAVENOUS | Status: AC
  Administered 2022-05-15: 260 mg via INTRAVENOUS
  Filled 2022-05-15: qty 5

## 2022-05-15 MED ORDER — SODIUM CHLORIDE 0.9 % IV SOLN
10.0000 mg | Freq: Once | INTRAVENOUS | Status: AC
  Administered 2022-05-15: 10 mg via INTRAVENOUS
  Filled 2022-05-15: qty 10

## 2022-05-15 MED ORDER — DICLOFENAC SODIUM 1 % EX GEL: 4.0000 g | Freq: Four times a day (QID) | CUTANEOUS | 0 refills | Status: AC

## 2022-05-15 MED ORDER — SODIUM CHLORIDE 0.9 % IV SOLN
400.0000 mg/m2 | Freq: Once | INTRAVENOUS | Status: AC
  Administered 2022-05-15: 748 mg via INTRAVENOUS
  Filled 2022-05-15: qty 37.4

## 2022-05-15 MED ORDER — FLUOROURACIL CHEMO INJECTION 2.5 GM/50ML
400.0000 mg/m2 | Freq: Once | INTRAVENOUS | Status: AC
  Administered 2022-05-15: 750 mg via INTRAVENOUS
  Filled 2022-05-15: qty 15

## 2022-05-15 MED ORDER — SODIUM CHLORIDE 0.9 % IV SOLN
510.0000 mg | Freq: Once | INTRAVENOUS | Status: AC
  Administered 2022-05-15: 510 mg via INTRAVENOUS
  Filled 2022-05-15: qty 17

## 2022-05-15 MED ORDER — SODIUM CHLORIDE 0.9 % IV SOLN: Freq: Once | INTRAVENOUS | Status: AC

## 2022-05-15 MED ORDER — CAPECITABINE 500 MG PO TABS
2000.0000 mg | ORAL_TABLET | Freq: Two times a day (BID) | ORAL | 0 refills | Status: DC
  Filled 2022-05-15: qty 112, 21d supply, fill #0

## 2022-05-15 MED ORDER — CAPECITABINE 500 MG PO TABS
1000.0000 mg/m2 | ORAL_TABLET | Freq: Two times a day (BID) | ORAL | 0 refills | Status: DC
  Filled 2022-05-15: qty 448, 56d supply, fill #0

## 2022-05-15 MED ORDER — ATROPINE SULFATE 1 MG/ML IV SOLN
0.5000 mg | Freq: Once | INTRAVENOUS | Status: AC | PRN
  Administered 2022-05-15: 0.5 mg via INTRAVENOUS
  Filled 2022-05-15: qty 1

## 2022-05-15 MED ORDER — CAPECITABINE 500 MG PO TABS
2000.0000 mg | ORAL_TABLET | Freq: Two times a day (BID) | ORAL | 1 refills | Status: DC
  Filled 2022-05-15 – 2022-05-16 (×2): qty 224, 28d supply, fill #0
  Filled 2022-06-05 – 2022-08-13 (×3): qty 224, 28d supply, fill #1

## 2022-05-15 MED ORDER — SODIUM CHLORIDE 0.9 % IV SOLN
1920.0000 mg/m2 | INTRAVENOUS | Status: DC
  Administered 2022-05-15: 3600 mg via INTRAVENOUS
  Filled 2022-05-15: qty 72

## 2022-05-15 MED ORDER — PALONOSETRON HCL INJECTION 0.25 MG/5ML
0.2500 mg | Freq: Once | INTRAVENOUS | Status: AC
  Administered 2022-05-15: 0.25 mg via INTRAVENOUS
  Filled 2022-05-15: qty 5

## 2022-05-15 NOTE — ED Notes (Signed)
Assisted pt to the bathroom in a wheelchair

## 2022-05-15 NOTE — Progress Notes (Signed)
Feraheme okay to give today per Virgina Evener, Estate manager/land agent.

## 2022-05-15 NOTE — ED Provider Notes (Signed)
[facility] EMERGENCY DEPARTMENT Provider Note   CSN: [id number] Arrival date & time: 05/14/22  2136     History  Chief Complaint  Patient presents with   Fall    Bailey Walker is a 76 y.o. female.  76 yo F with a chief complaint of a fall.  She slipped on a wet floor and landed on her bottom.  She injured her right great toenail and is having pain to her bottom where she landed.  Denies head injury.  Has chronic ear pain and has had some pain to that now.  Denies trauma to it.   Fall       Home Medications Prior to Admission medications   Medication Sig Start Date End Date Taking? Authorizing Provider  diclofenac Sodium (VOLTAREN) 1 % GEL Apply 4 g topically 4 (four) times daily. 05/15/22  Yes Deno Etienne, DO  alendronate (FOSAMAX) 70 MG tablet Take 70 mg by mouth every Sunday. 02/22/21   [provider]  aspirin EC 81 MG tablet Take 81 mg by mouth daily. Swallow whole.    [provider]  atorvastatin (LIPITOR) 40 MG tablet Take 1 tablet by mouth daily. 02/09/22   [provider]  capecitabine (XELODA) 500 MG tablet Take 4 tablets (2,000 mg total) by mouth 2 (two) times daily after a meal. Start on 06/09/2022  Take 4 pills 2x a day for 14 days on and 7 days off. 06/09/22   Volanda Napoleon, MD  CVS GENTLE LAXATIVE 5 MG EC tablet Take 5 mg by mouth daily as needed. Patient not taking: Reported on 02/24/2022 05/28/21   [provider]  CVS PURELAX 17 GM/SCOOP powder Take 17 g by mouth daily as needed. Patient not taking: Reported on 02/24/2022 05/28/21   [provider]  dexamethasone (DECADRON) 4 MG tablet Take 2 tablets (8 mg total) by mouth daily. Start the day after chemo for 2 days. 03/06/22   Volanda Napoleon, MD  feeding supplement, ENSURE ENLIVE, (ENSURE ENLIVE) LIQD Take 237 mLs by mouth 3 (three) times daily between meals. Patient not taking: Reported on 02/24/2022 05/02/20   Raiford Noble Latif, DO  glipiZIDE  (GLUCOTROL) 5 MG tablet TAKE 1 TABLET BY MOUTH TWICE A DAY BEFORE MEALS 11/04/19   Dorena Dew, FNP  HYDROcodone-acetaminophen (NORCO/VICODIN) 5-325 MG tablet Take 1 tablet by mouth every 6 (six) hours as needed for moderate pain. 04/30/22   Volanda Napoleon, MD  KLOR-CON M20 20 MEQ tablet TAKE 1 TABLET BY MOUTH TWICE A DAY 03/03/22   Volanda Napoleon, MD  levothyroxine (SYNTHROID) 75 MCG tablet TAKE 1 TABLET BY MOUTH EVERY DAY 01/02/20   Dorena Dew, FNP  loperamide (IMODIUM) 2 MG capsule TAKE 2 CAPSULES (4 MG TOTAL) BY MOUTH AS NEEDED. TAKE 2 AT DIARRHEA ONSET , THEN 1 EVERY 2HR UNTIL 12HRS WITH NO BM. MAY TAKE 2 EVERY 4HRS AT NIGHT. IF DIARRHEA RECURS REPEAT. 10/22/21   Volanda Napoleon, MD  metFORMIN (GLUCOPHAGE) 500 MG tablet Take 0.5 tablets (250 mg total) by mouth 2 (two) times daily with a meal. Patient taking differently: Take 500 mg by mouth 2 (two) times daily with a meal. 05/16/19   [name], FNP  methocarbamol (ROBAXIN) 500 MG tablet TAKE 1 TABLET BY MOUTH EVERY 12 HOURS AS NEEDED FOR MUSCLE SPASMS. 01/27/22   Volanda Napoleon, MD  metoprolol tartrate (LOPRESSOR) 25 MG tablet Take 1 tablet (25 mg total) by mouth 2 (two) times daily.  08/02/19   Tresa Garter, MD  Multiple Vitamin (MULTIVITAMIN WITH MINERALS) TABS tablet Take 1 tablet by mouth daily. 05/02/20   Sheikh, Omair Latif, DO  ondansetron (ZOFRAN) 8 MG tablet TAKE 1 TABLET (8 MG TOTAL) BY MOUTH 2 (TWO) TIMES DAILY AS NEEDED FOR REFRACTORY NAUSEA / VOMITING. START ON DAY 3 AFTER CHEMOTHERAPY. Patient not taking: Reported on 02/24/2022 02/10/22   Volanda Napoleon, MD  pantoprazole (PROTONIX) 20 MG tablet Take 1 tablet (20 mg total) by mouth 2 (two) times daily. 05/06/22   Volanda Napoleon, MD  prednisoLONE acetate (PRED FORTE) 1 % ophthalmic suspension Place 1 drop into both eyes every Monday.    [provider]  prochlorperazine (COMPAZINE) 10 MG tablet Take 1 tablet (10 mg total) by mouth every 6 (six) hours as  needed (NAUSEA). 07/31/21   Volanda Napoleon, MD  silver sulfADIAZINE (SILVADENE) 1 % cream Apply 1 application topically daily. Patient not taking: Reported on 02/24/2022 10/14/21   Volanda Napoleon, MD  traMADol (ULTRAM) 50 MG tablet Take 1 tablet (50 mg total) by mouth every 6 (six) hours as needed. 04/30/22 06/29/22  Volanda Napoleon, MD  Vitamin D, Ergocalciferol, (DRISDOL) 1.25 MG (50000 UNIT) CAPS capsule Take 50,000 Units by mouth once a week. Patient not taking: Reported on 02/24/2022 08/11/21   [provider]      Allergies    Nitroglycerin and Ampicillin    Review of Systems   Review of Systems  Physical Exam Updated Vital Signs BP (!) 169/81 (BP Location: Left Arm)   Pulse 93   Temp 98.2 F (36.8 C) (Oral)   Resp 18   Ht '5\' 2"'$  (1.575 m)   Wt 75.8 kg   SpO2 97%   BMI 30.54 kg/m  Physical Exam Vitals and nursing note reviewed.  Constitutional:      General: She is not in acute distress.    Appearance: She is well-developed. She is not diaphoretic.  HENT:     Head: Normocephalic and atraumatic.  Eyes:     Pupils: Pupils are equal, round, and reactive to light.  Cardiovascular:     Rate and Rhythm: Normal rate and regular rhythm.     Heart sounds: No murmur heard.    No friction rub. No gallop.  Pulmonary:     Effort: Pulmonary effort is normal.     Breath sounds: No wheezing or rales.  Abdominal:     General: There is no distension.     Palpations: Abdomen is soft.     Tenderness: There is no abdominal tenderness.     Comments: Chronic appearing deformity to the abdomen.  Colostomy bag in place.  Musculoskeletal:        General: No tenderness.     Cervical back: Normal range of motion and neck supple.     Comments: No obvious pain with compression of the pelvis.  No midline spinal tenderness step-offs or deformities.  I am able to range bilateral hips without significant pain.  She has signs of a subungual hematoma to the right great toe.  This is slightly  displaced distally and has some blood draining from under the end of the nail.  Skin:    General: Skin is warm and dry.  Neurological:     Mental Status: She is alert and oriented to person, place, and time.  Psychiatric:        Behavior: Behavior normal.     ED Results / Procedures / Treatments  Labs (all labs ordered are listed, but only abnormal results are displayed) Labs Reviewed - No data to display  EKG None  Radiology DG Pelvis Portable  Result Date: 05/15/2022 CLINICAL DATA:  Fall. EXAM: PORTABLE PELVIS 1-2 VIEWS COMPARISON:  CT abdomen pelvis dated 05/31/2021. FINDINGS: There is no acute fracture or dislocation the bones are osteopenic. No significant arthritic changes. The degenerative changes of the lower lumbar spine. The soft tissues are grossly unremarkable. Left lower quadrant ostomy. IMPRESSION: No acute fracture or dislocation. Electronically Signed   By: Anner Crete M.D.   On: 05/15/2022 02:23    Procedures Procedures    Medications Ordered in ED Medications - No data to display  ED Course/ Medical Decision Making/ A&P                           Medical Decision Making Amount and/or Complexity of Data Reviewed Radiology: ordered.   76 yo F with a chief complaint of a fall.  Nonsyncopal by history.  She has a subungual hematoma but it is currently draining due to displacement distally.  Do not feel trephination would be beneficial.  Patient was complaining of pain to her bottom and so a pelvis was ordered.  Plain film independently interpreted by me without fracture.  She is ambulatory.  PCP follow-up.  4:34 AM:  I have discussed the diagnosis/risks/treatment options with the patient and family.  Evaluation and diagnostic testing in the emergency department does not suggest an emergent condition requiring admission or immediate intervention beyond what has been performed at this time.  They will follow up with  PCP. We also discussed returning to the ED  immediately if new or worsening sx occur. We discussed the sx which are most concerning (e.g., sudden worsening pain, fever, inability to tolerate by mouth) that necessitate immediate return. Medications administered to the patient during their visit and any new prescriptions provided to the patient are listed below.  Medications given during this visit Medications - No data to display   The patient appears reasonably screen and/or stabilized for discharge and I doubt any other medical condition or other [facility] requiring further screening, evaluation, or treatment in the ED at this time prior to discharge.          Final Clinical Impression(s) / ED Diagnoses Final diagnoses:  Fall, initial encounter  Toenail avulsion, initial encounter  Buttock pain    Rx / DC Orders ED Discharge Orders          Ordered    diclofenac Sodium (VOLTAREN) 1 % GEL  4 times daily        05/15/22 0231              Deno Etienne, DO 05/15/22 [phone]

## 2022-05-15 NOTE — Progress Notes (Signed)
.  Pt arrived with PAC accessed. Last seen in ED early this AM.

## 2022-05-15 NOTE — Progress Notes (Signed)
Verbal order per MD, "ok to treat "

## 2022-05-15 NOTE — Telephone Encounter (Signed)
Oral Oncology Pharmacist Encounter  Received new prescription for Xeloda (capecitabine) for the treatment of metastatic colon cancer, planned duration until disease progression or unacceptable drug toxicity.  CBC w/ Diff and CMP from 05/14/22 assessed, no relevant lab abnormalities requiring baseline dose adjustment required at this time. Prescription dose and frequency assessed for appropriateness. Since patient is leaving the country for 2 months, MD wanting to dispense 4 cycles of Xeloda for patient to have while traveling.   Current medication list in Epic reviewed, DDIs with Xeloda identified: Category C DDI between Xeloda and Pantoprazole - proton-pump inhibitors can decrease efficacy of Xeloda - will discuss with patient alternatives to pantoprazole, such as H2RA's like famotidine while on Xeloda.  Evaluated chart and no patient barriers to medication adherence noted.   Patient agreement for treatment documented in MD note on 05/14/22.  Prescription has been e-scribed to the [facility] for benefits analysis and approval.  Oral Oncology Clinic will continue to follow for insurance authorization, copayment issues, initial counseling and start date.  Leron Croak, PharmD, BCPS, [facility] Hematology/Oncology Clinical Pharmacist [facility] [address] [phone] 05/15/2022 8:35 AM

## 2022-05-15 NOTE — Telephone Encounter (Signed)
Oral Oncology Patient Advocate Encounter  After completing a benefits investigation, prior authorization for Capecitabine is not required at this time through [facility].  Patient's copay is $4   Bailey Walker, CPhT-Adv Pharmacy Patient Advocate Specialist [facility] Patient Advocate Team Direct Number: [phone]  Fax: [phone]

## 2022-05-15 NOTE — Discharge Instructions (Addendum)
There was no obvious broken bone on the x-ray.  Please follow-up with your family doctor in the office.  I have prescribed a gel that you can put right on the area that hurts.  You can also take Tylenol for your discomfort.  Use the gel as prescribed Also take tylenol '1000mg'$ (2 extra strength) four times a day.

## 2022-05-15 NOTE — Patient Instructions (Signed)
Bailey Walker  Discharge Instructions: Thank you for choosing [facility] to provide your oncology and hematology care.   If you have a lab appointment with the [facility], please go directly to the [facility] and check in at the registration area.  Wear comfortable clothing and clothing appropriate for easy access to any Portacath or PICC line.   We strive to give you quality time with your provider. You may need to reschedule your appointment if you arrive late (15 or more minutes).  Arriving late affects you and other patients whose appointments are after yours.  Also, if you miss three or more appointments without notifying the office, you may be dismissed from the clinic at the provider's discretion.      For prescription refill requests, have your pharmacy contact our office and allow 72 hours for refills to be completed.    Today you received the following chemotherapy and/or immunotherapy agents 5FU, :eucovorin, Irinotecan      To help prevent nausea and vomiting after your treatment, we encourage you to take your nausea medication as directed.  BELOW ARE SYMPTOMS THAT SHOULD BE REPORTED IMMEDIATELY: *FEVER GREATER THAN 100.4 F (38 C) OR HIGHER *CHILLS OR SWEATING *NAUSEA AND VOMITING THAT IS NOT CONTROLLED WITH YOUR NAUSEA MEDICATION *UNUSUAL SHORTNESS OF BREATH *UNUSUAL BRUISING OR BLEEDING *URINARY PROBLEMS (pain or burning when urinating, or frequent urination) *BOWEL PROBLEMS (unusual diarrhea, constipation, pain near the anus) TENDERNESS IN MOUTH AND THROAT WITH OR WITHOUT PRESENCE OF ULCERS (sore throat, sores in mouth, or a toothache) UNUSUAL RASH, SWELLING OR PAIN  UNUSUAL VAGINAL DISCHARGE OR ITCHING   Items with * indicate a potential emergency and should be followed up as soon as possible or go to the Emergency Department if any problems should occur.  Please show the CHEMOTHERAPY ALERT CARD or IMMUNOTHERAPY ALERT CARD at  check-in to the Emergency Department and triage nurse. Should you have questions after your visit or need to cancel or reschedule your appointment, please contact [facility]  [phone] and follow the prompts.  Office hours are 8:00 a.m. to 4:30 p.m. Monday - Friday. Please note that voicemails left after 4:00 p.m. may not be returned until the following business day.  We are closed weekends and major holidays. You have access to a nurse at all times for urgent questions. Please call the main number to the clinic [phone] and follow the prompts.  For any non-urgent questions, you may also contact your provider using MyChart. We now offer e-Visits for anyone 85 and older to request care online for non-urgent symptoms. For details visit [email or web address].   Also download the MyChart app! Go to the app store, search "MyChart", open the app, select [facility], and log in with your MyChart username and password.  Masks are optional in the cancer centers. If you would like for your care team to wear a mask while they are taking care of you, please let them know. You may have one support person who is at least 78 years old accompany you for your appointments.

## 2022-05-16 ENCOUNTER — Other Ambulatory Visit (HOSPITAL_COMMUNITY): Payer: Self-pay

## 2022-05-16 NOTE — Telephone Encounter (Signed)
Oral Chemotherapy Pharmacist Encounter  I spoke with patient's niece, Patriciaann Clan, for overview of: Xeloda (capecitabine) for the  treatment of metastatic colon cancer, planned duration until disease progression or unacceptable drug toxicity.  Counseled on administration, dosing, side effects, monitoring, drug-food interactions, safe handling, storage, and disposal.  Patient will take Xeloda '500mg'$  tablets, 4 tablets ('2000mg'$ ) by mouth in AM and 4 tabs ('2000mg'$ ) by mouth in PM, within 30 minutes of finishing meals, for 14 days on, 7 days off, repeated every 21 days.  Xeloda start date: 06/09/22  Adverse effects include but are not limited to: fatigue, decreased blood counts, GI upset, diarrhea, mouth sores, and hand-foot syndrome. Patient has anti-emetic on hand and knows to take it if nausea develops.   Patient will obtain anti diarrheal and alert the office of 4 or more loose stools above baseline.  Reviewed importance of keeping a medication schedule and plan for any missed doses. No barriers to medication adherence identified.  Medication reconciliation performed and medication/allergy list updated. Discussed the drug-drug interaction between Xeloda and pantoprazole, and risk for decrease efficacy of Xeloda when used in combination with PPIs. Patient will switch over to famotidine 20 mg BID while she is on Xeloda for control of GERD sx.   Patient's niece will pick this up from the [facility] on 05/16/22, but knows patient is to not start until 06/09/22.  All questions answered.  Patient's niece voiced understanding and appreciation.   Medication education handout and medication calendar emailed to patient's niece who helps wit her medication. Patient's family knows to call the office with questions or concerns. Oral Chemotherapy Clinic phone number provided.   Leron Croak, PharmD, BCPS, BCOP Hematology/Oncology Clinical Pharmacist [facility] and [place] [phone] 05/16/2022 11:29 AM

## 2022-05-17 ENCOUNTER — Encounter: Payer: Self-pay | Admitting: Hematology and Oncology

## 2022-05-17 ENCOUNTER — Inpatient Hospital Stay: Payer: Medicaid Other

## 2022-05-17 VITALS — BP 143/65 | HR 72 | Temp 98.1°F

## 2022-05-17 DIAGNOSIS — Z510 Encounter for antineoplastic radiation therapy: Secondary | ICD-10-CM | POA: Diagnosis not present

## 2022-05-17 DIAGNOSIS — Z95828 Presence of other vascular implants and grafts: Secondary | ICD-10-CM

## 2022-05-17 DIAGNOSIS — D509 Iron deficiency anemia, unspecified: Secondary | ICD-10-CM

## 2022-05-17 MED ORDER — PEGFILGRASTIM-CBQV 6 MG/0.6ML ~~LOC~~ SOSY
6.0000 mg | PREFILLED_SYRINGE | Freq: Once | SUBCUTANEOUS | Status: AC
  Administered 2022-05-17: 6 mg via SUBCUTANEOUS

## 2022-05-17 MED ORDER — SODIUM CHLORIDE 0.9% FLUSH
10.0000 mL | Freq: Once | INTRAVENOUS | Status: AC
  Administered 2022-05-17: 10 mL

## 2022-05-17 MED ORDER — HEPARIN SOD (PORK) LOCK FLUSH 100 UNIT/ML IV SOLN
500.0000 [IU] | Freq: Once | INTRAVENOUS | Status: AC
  Administered 2022-05-17: 500 [IU]

## 2022-05-19 ENCOUNTER — Other Ambulatory Visit: Payer: Self-pay

## 2022-05-19 ENCOUNTER — Ambulatory Visit: Payer: Medicaid Other

## 2022-05-19 ENCOUNTER — Encounter: Payer: Self-pay | Admitting: Radiation Oncology

## 2022-05-19 ENCOUNTER — Ambulatory Visit
Admission: RE | Admit: 2022-05-19 | Discharge: 2022-05-19 | Disposition: A | Discharge status: Home / Self Care | Payer: Medicaid Other | Source: Ambulatory Visit | Attending: Radiation Oncology | Admitting: Radiation Oncology

## 2022-05-19 ENCOUNTER — Ambulatory Visit (HOSPITAL_BASED_OUTPATIENT_CLINIC_OR_DEPARTMENT_OTHER)
Admission: RE | Admit: 2022-05-19 | Discharge: 2022-05-19 | Disposition: A | Discharge status: Home / Self Care | Payer: Medicaid Other | Source: Ambulatory Visit

## 2022-05-19 DIAGNOSIS — K5901 Slow transit constipation: Secondary | ICD-10-CM | POA: Diagnosis not present

## 2022-05-19 DIAGNOSIS — Z510 Encounter for antineoplastic radiation therapy: Secondary | ICD-10-CM | POA: Diagnosis not present

## 2022-05-19 DIAGNOSIS — L24B3 Irritant contact dermatitis related to fecal or urinary stoma or fistula: Secondary | ICD-10-CM

## 2022-05-19 DIAGNOSIS — K5900 Constipation, unspecified: Secondary | ICD-10-CM | POA: Insufficient documentation

## 2022-05-19 DIAGNOSIS — K9400 Colostomy complication, unspecified: Secondary | ICD-10-CM | POA: Diagnosis not present

## 2022-05-19 DIAGNOSIS — K9409 Other complications of colostomy: Secondary | ICD-10-CM | POA: Insufficient documentation

## 2022-05-19 DIAGNOSIS — C772 Secondary and unspecified malignant neoplasm of intra-abdominal lymph nodes: Secondary | ICD-10-CM

## 2022-05-19 LAB — RAD ONC ARIA SESSION SUMMARY
Course Elapsed Days: 6
Plan Fractions Treated to Date: 3
Plan Prescribed Dose Per Fraction: 18 Gy
Plan Total Fractions Prescribed: 3
Plan Total Prescribed Dose: 54 Gy
Reference Point Dosage Given to Date: 54 Gy
Reference Point Session Dosage Given: 18 Gy
Session Number: 3

## 2022-05-19 NOTE — Discharge Instructions (Signed)
We are switching to a 1 piece light convex Stoma powder and skin prep to skin breakdown Cover this area only with barrier ring (only around 1/2 of stoma- open area)  New pattern provided for pouch.  Wear barrier strips to outside edge.  Wear ostomy belt for support.

## 2022-05-20 ENCOUNTER — Other Ambulatory Visit: Payer: Medicaid Other

## 2022-05-20 ENCOUNTER — Ambulatory Visit: Payer: Medicaid Other | Admitting: Hematology & Oncology

## 2022-05-20 ENCOUNTER — Ambulatory Visit: Payer: Medicaid Other

## 2022-05-20 ENCOUNTER — Ambulatory Visit: Payer: Medicaid Other | Admitting: Radiation Oncology

## 2022-05-21 ENCOUNTER — Encounter: Payer: Self-pay | Admitting: *Deleted

## 2022-05-21 ENCOUNTER — Other Ambulatory Visit: Payer: Self-pay

## 2022-05-21 DIAGNOSIS — C772 Secondary and unspecified malignant neoplasm of intra-abdominal lymph nodes: Secondary | ICD-10-CM

## 2022-05-21 MED ORDER — TRAMADOL HCL 50 MG PO TABS: 50.0000 mg | ORAL_TABLET | Freq: Four times a day (QID) | ORAL | 3 refills | Status: AC | PRN

## 2022-05-21 MED ORDER — HYDROCODONE-ACETAMINOPHEN 5-325 MG PO TABS: 1.0000 | ORAL_TABLET | Freq: Four times a day (QID) | ORAL | 0 refills | Status: DC | PRN

## 2022-05-21 NOTE — Progress Notes (Signed)
Approval received for Tramadol from plan.  Case ID [id number] from 05/21/22-11/17/22.

## 2022-05-22 DIAGNOSIS — K5901 Slow transit constipation: Secondary | ICD-10-CM | POA: Insufficient documentation

## 2022-05-22 DIAGNOSIS — L24B3 Irritant contact dermatitis related to fecal or urinary stoma or fistula: Secondary | ICD-10-CM | POA: Insufficient documentation

## 2022-05-22 DIAGNOSIS — K9400 Colostomy complication, unspecified: Secondary | ICD-10-CM | POA: Insufficient documentation

## 2022-05-22 NOTE — Progress Notes (Signed)
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  Supplies are scarce and patient is leaving the country in 2 days.  Will be gone for 2 months.  Pouch is heavily taped with medical tape. Surrounding skin is irritated and red.  Her niece is with her that performs her ostomy care.  HPI:  Cancer sigmoid colon  colostomy ROS  Review of Systems  Gastrointestinal:  Positive for constipation.       LUQ colostomy with thick stool  Skin:  Positive for rash and wound.       peristomal  All other systems reviewed and are negative.  Vital signs:  BP 124/61 (BP Location: Right Arm)   Pulse 83   Temp 97.9 F (36.6 C) (Oral)   Resp 18   SpO2 98%  Exam:  Physical Exam Constitutional:      Appearance: She is obese.  Skin:    Findings: Lesion and rash present.  Neurological:     Mental Status: She is alert.     Stoma type/location:  LUQ colostomy. Stomal assessment/size:  1 1/2"  Peristomal assessment:  denuded skin from 3 to 9 o'clock Treatment options for stomal/peristomal skin: stoma powder and skin prep to breakdown  1/2 barrier ring along bottom half of stomal area.  Output: very thick pasty stool.  Patient is constipated and is instructed to begin miralax twice daily. I cannot feel any hard stool in stoma with finger.  Niece states that hard balls are expelled some times.  Ostomy pouching: 1pc.light convex pouch due to excess skin to peristomal area.  Education provided:  performed pouch change and gave 5 additional pouch sets.   I applied barrier strips and an ostomy belt for added security and fewer leaks.  Instructed to try and avoid taping.    Impression/dx  Contact dermatitis constipation Discussion  See above Plan  See above See back In clinic as needed once she returns home.     Visit time: 55 minutes.   Domenic Moras FNP-BC

## 2022-06-03 ENCOUNTER — Other Ambulatory Visit (HOSPITAL_COMMUNITY): Payer: Self-pay

## 2022-06-05 ENCOUNTER — Other Ambulatory Visit (HOSPITAL_COMMUNITY): Payer: Self-pay

## 2022-06-12 ENCOUNTER — Other Ambulatory Visit (HOSPITAL_COMMUNITY): Payer: Self-pay

## 2022-06-19 ENCOUNTER — Ambulatory Visit: Payer: Self-pay | Admitting: Radiation Oncology

## 2022-07-06 ENCOUNTER — Encounter: Payer: Self-pay | Admitting: Hematology & Oncology

## 2022-07-14 ENCOUNTER — Other Ambulatory Visit (HOSPITAL_COMMUNITY): Payer: Self-pay

## 2022-07-16 ENCOUNTER — Encounter: Payer: Self-pay | Admitting: Radiation Oncology

## 2022-07-20 NOTE — Progress Notes (Incomplete)
  Radiation Oncology         [phone] ________________________________  Patient Name: Bailey Walker MRN: [id number] DOB: 25-Jul-1946 Referring Physician: [name] (Profile Not Attached) Date of Service: 05/19/2022 [facility] [place], [place]                                                        End Of Treatment Note  Diagnoses: R91.1-Solitary pulmonary nodule  Cancer Staging: The primary encounter diagnosis was 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 ([place]). A diagnosis of Lung nodule was also pertinent to this visit.   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 On the date of her final treatment, the patient reported pain at her ostomy site, fatigue, occasional SOB and cough. She denied any other symptoms.   Plan: The patient will follow-up with radiation oncology in one month .  ________________________________________________ -----------------------------------  Blair Promise, PhD, MD  This document serves as a record of services personally performed by Gery Pray, MD. It was created on his behalf by Roney Mans, a trained medical scribe. The creation of this record is based on the scribe's personal observations and the provider's statements to them. This document has been checked and approved by the attending provider.

## 2022-07-20 NOTE — Progress Notes (Incomplete)
Radiation Oncology         [phone] ________________________________  Name: Bailey Walker MRN: [id number]  Date: 07/21/2022  DOB: 1945/10/04  Follow-Up Visit Note  CC: [name], MD  [name], MD  No diagnosis found.  Diagnosis:   The primary encounter diagnosis was 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 ([place]). A diagnosis of Lung nodule was also pertinent to this visit.   Stage IIIB ([id number]) metastatic adenocarcinoma of the sigmoid colon; adnexal mass, and metastatic disease to the lung and an intra-abdominal lymph node - s/p sigmoid colectomy and chemotherapy  Interval Since Last Radiation: 2 months and 2 days   Intent: Curative  Radiation Treatment Dates: 05/13/2022 through 05/19/2022 Site Technique Total Dose (Gy) Dose per Fx (Gy) Completed Fx Beam Energies  Lung, Left: Lung_L IMRT 54/54 18 3/3 6XFFF    Narrative:  The patient returns today for routine follow-up. The patient tolerated radiation therapy relatively well. On the date of her final treatment, the patient reported pain at her ostomy site, fatigue, occasional SOB and cough. She denied any other symptoms.   IN the midst of radiation treatment, the patient followed up with Dr. Marin Olp on 05/14/22. Given that the patient was leaving on 05/23/22 to visit her family in [place], Dr. Marin Olp approved for her to receive her last cycle of chemotherapy before she left. He also arranged for her to start Xeloda while overseas. The patient's niece was able to arrange for her to have blood work done while in [place]. Dr. Marin Olp has reviewed a copy of the report which shows stable findings.   Of note: The patient presented to the ED on 05/15/22 after a fall which resulted in some pelvic pain. Pelvic x-ray showed no evidence of acute fracture or dislocation.  ***  Allergies:  is allergic to nitroglycerin and ampicillin.  Meds: Current Outpatient Medications  Medication Sig Dispense Refill    alendronate (FOSAMAX) 70 MG tablet Take 70 mg by mouth every Sunday.     aspirin EC 81 MG tablet Take 81 mg by mouth daily. Swallow whole.     atorvastatin (LIPITOR) 40 MG tablet Take 1 tablet by mouth daily.     capecitabine (XELODA) 500 MG tablet Take 4 tablets (2,000 mg total) by mouth 2 (two) times daily after a meal. Take as instructed by MD. Start on 06/09/2022 224 tablet 1   CVS GENTLE LAXATIVE 5 MG EC tablet Take 5 mg by mouth daily as needed. (Patient not taking: Reported on 02/24/2022)     CVS PURELAX 17 GM/SCOOP powder Take 17 g by mouth daily as needed. (Patient not taking: Reported on 02/24/2022)     dexamethasone (DECADRON) 4 MG tablet Take 2 tablets (8 mg total) by mouth daily. Start the day after chemo for 2 days. 8 tablet 5   diclofenac Sodium (VOLTAREN) 1 % GEL Apply 4 g topically 4 (four) times daily. 100 g 0   feeding supplement, ENSURE ENLIVE, (ENSURE ENLIVE) LIQD Take 237 mLs by mouth 3 (three) times daily between meals. (Patient not taking: Reported on 02/24/2022) 237 mL 12   glipiZIDE (GLUCOTROL) 5 MG tablet TAKE 1 TABLET BY MOUTH TWICE A DAY BEFORE MEALS 60 tablet 3   HYDROcodone-acetaminophen (NORCO/VICODIN) 5-325 MG tablet Take 1 tablet by mouth every 6 (six) hours as needed for moderate pain. 60 tablet 0   KLOR-CON M20 20 MEQ tablet TAKE 1 TABLET BY MOUTH TWICE A DAY 30 tablet 4   levothyroxine (SYNTHROID) 75  MCG tablet TAKE 1 TABLET BY MOUTH EVERY DAY 30 tablet 0   loperamide (IMODIUM) 2 MG capsule TAKE 2 CAPSULES (4 MG TOTAL) BY MOUTH AS NEEDED. TAKE 2 AT DIARRHEA ONSET , THEN 1 EVERY 2HR UNTIL 12HRS WITH NO BM. MAY TAKE 2 EVERY 4HRS AT NIGHT. IF DIARRHEA RECURS REPEAT. 90 capsule 2   metFORMIN (GLUCOPHAGE) 500 MG tablet Take 0.5 tablets (250 mg total) by mouth 2 (two) times daily with a meal. (Patient taking differently: Take 500 mg by mouth 2 (two) times daily with a meal.) 90 tablet 3   methocarbamol (ROBAXIN) 500 MG tablet TAKE 1 TABLET BY MOUTH EVERY 12 HOURS AS NEEDED  FOR MUSCLE SPASMS. 60 tablet 2   metoprolol tartrate (LOPRESSOR) 25 MG tablet Take 1 tablet (25 mg total) by mouth 2 (two) times daily. 180 tablet 3   Multiple Vitamin (MULTIVITAMIN WITH MINERALS) TABS tablet Take 1 tablet by mouth daily. 30 tablet 0   ondansetron (ZOFRAN) 8 MG tablet TAKE 1 TABLET (8 MG TOTAL) BY MOUTH 2 (TWO) TIMES DAILY AS NEEDED FOR REFRACTORY NAUSEA / VOMITING. START ON DAY 3 AFTER CHEMOTHERAPY. (Patient not taking: Reported on 02/24/2022) 30 tablet 1   pantoprazole (PROTONIX) 20 MG tablet Take 1 tablet (20 mg total) by mouth 2 (two) times daily. 60 tablet 3   prednisoLONE acetate (PRED FORTE) 1 % ophthalmic suspension Place 1 drop into both eyes every Monday.     prochlorperazine (COMPAZINE) 10 MG tablet Take 1 tablet (10 mg total) by mouth every 6 (six) hours as needed (NAUSEA). 30 tablet 1   silver sulfADIAZINE (SILVADENE) 1 % cream Apply 1 application topically daily. (Patient not taking: Reported on 02/24/2022) 50 g 2   traMADol (ULTRAM) 50 MG tablet Take 1 tablet (50 mg total) by mouth every 6 (six) hours as needed. 60 tablet 3   Vitamin D, Ergocalciferol, (DRISDOL) 1.25 MG (50000 UNIT) CAPS capsule Take 50,000 Units by mouth once a week. (Patient not taking: Reported on 02/24/2022)     No current facility-administered medications for this encounter.   Facility-Administered Medications Ordered in Other Encounters  Medication Dose Route Frequency Provider Last Rate Last Admin   0.9 %  sodium chloride infusion   Intravenous Once Ennever, Rudell Cobb, MD       0.9 %  sodium chloride infusion   Intravenous Once Ennever, Rudell Cobb, MD       clindamycin (CLEOCIN) 900 mg in dextrose 5 % 50 mL IVPB  900 mg Intravenous 60 min Pre-Op Michael Boston, MD       And   gentamicin (GARAMYCIN) 5 mg/kg in dextrose 5 % 50 mL IVPB  5 mg/kg Intravenous 60 min Pre-Op Michael Boston, MD       pegfilgrastim-cbqv [facility]) 6 MG/0.6ML injection             Physical Findings: The patient is in no acute  distress. Patient is alert and oriented.  vitals were not taken for this visit. .  No significant changes. Lungs are clear to auscultation bilaterally. Heart has regular rate and rhythm. No palpable cervical, supraclavicular, or axillary adenopathy. Abdomen soft, non-tender, normal bowel sounds.   Lab Findings: Lab Results  Component Value Date   WBC 12.6 (H) 05/14/2022   HGB 10.6 (L) 05/14/2022   HCT 33.1 (L) 05/14/2022   MCV 98.8 05/14/2022   PLT 230 05/14/2022    Radiographic Findings: No results found.  Impression:  The primary encounter diagnosis was Cancer of sigmoid colon metastatic  to intra-abdominal lymph node ([place]). A diagnosis of Lung nodule was also pertinent to this visit.   Stage IIIB ([id number]) metastatic adenocarcinoma of the sigmoid colon; adnexal mass, and metastatic disease to the lung and an intra-abdominal lymph node - s/p sigmoid colectomy and chemotherapy  The patient is recovering from the effects of radiation.  ***  Plan:  ***   *** minutes of total time was spent for this patient encounter, including preparation, face-to-face counseling with the patient and coordination of care, physical exam, and documentation of the encounter. ____________________________________  Blair Promise, PhD, MD  This document serves as a record of services personally performed by Gery Pray, MD. It was created on his behalf by Roney Mans, a trained medical scribe. The creation of this record is based on the scribe's personal observations and the provider's statements to them. This document has been checked and approved by the attending provider.

## 2022-07-21 ENCOUNTER — Ambulatory Visit
Admission: RE | Admit: 2022-07-21 | Discharge: 2022-07-21 | Disposition: A | Discharge status: Home / Self Care | Payer: Medicaid Other | Source: Ambulatory Visit | Attending: Radiation Oncology | Admitting: Radiation Oncology

## 2022-07-21 HISTORY — DX: Personal history of irradiation: Z92.3

## 2022-07-24 ENCOUNTER — Other Ambulatory Visit (HOSPITAL_COMMUNITY): Payer: Self-pay

## 2022-07-25 ENCOUNTER — Other Ambulatory Visit (HOSPITAL_COMMUNITY): Payer: Self-pay

## 2022-07-28 ENCOUNTER — Telehealth: Payer: Self-pay | Admitting: *Deleted

## 2022-07-28 ENCOUNTER — Other Ambulatory Visit: Payer: Self-pay | Admitting: *Deleted

## 2022-07-28 DIAGNOSIS — C772 Secondary and unspecified malignant neoplasm of intra-abdominal lymph nodes: Secondary | ICD-10-CM

## 2022-07-28 DIAGNOSIS — D509 Iron deficiency anemia, unspecified: Secondary | ICD-10-CM

## 2022-07-28 NOTE — Telephone Encounter (Signed)
Received a call from [name], niece stating that patient is back in town from [place] and would like to schedule an appointment with Dr Marin Olp.  Scheduling message sent.

## 2022-07-29 ENCOUNTER — Inpatient Hospital Stay: Payer: Medicaid Other | Admitting: Hematology & Oncology

## 2022-07-29 ENCOUNTER — Other Ambulatory Visit: Payer: Self-pay

## 2022-07-29 ENCOUNTER — Encounter: Payer: Self-pay | Admitting: Hematology & Oncology

## 2022-07-29 ENCOUNTER — Inpatient Hospital Stay: Payer: Medicaid Other

## 2022-07-29 ENCOUNTER — Ambulatory Visit (HOSPITAL_BASED_OUTPATIENT_CLINIC_OR_DEPARTMENT_OTHER): Payer: Medicaid Other

## 2022-07-29 ENCOUNTER — Inpatient Hospital Stay: Payer: Medicaid Other | Attending: Radiation Oncology

## 2022-07-29 VITALS — BP 117/69 | HR 78 | Temp 97.6°F | Resp 18 | Ht 59.0 in | Wt 156.4 lb

## 2022-07-29 DIAGNOSIS — C187 Malignant neoplasm of sigmoid colon: Secondary | ICD-10-CM | POA: Insufficient documentation

## 2022-07-29 DIAGNOSIS — C772 Secondary and unspecified malignant neoplasm of intra-abdominal lymph nodes: Secondary | ICD-10-CM

## 2022-07-29 DIAGNOSIS — R911 Solitary pulmonary nodule: Secondary | ICD-10-CM | POA: Diagnosis not present

## 2022-07-29 DIAGNOSIS — D510 Vitamin B12 deficiency anemia due to intrinsic factor deficiency: Secondary | ICD-10-CM | POA: Diagnosis not present

## 2022-07-29 DIAGNOSIS — Z95828 Presence of other vascular implants and grafts: Secondary | ICD-10-CM

## 2022-07-29 DIAGNOSIS — D509 Iron deficiency anemia, unspecified: Secondary | ICD-10-CM

## 2022-07-29 LAB — RETICULOCYTES
Immature Retic Fract: 10.5 % (ref 2.3–15.9)
RBC.: 3.39 MIL/uL — ABNORMAL LOW (ref 3.87–5.11)
Retic Count, Absolute: 70.5 10*3/uL (ref 19.0–186.0)
Retic Ct Pct: 2.1 % (ref 0.4–3.1)

## 2022-07-29 LAB — CMP (CANCER CENTER ONLY)
ALT: 20 U/L (ref 0–44)
AST: 23 U/L (ref 15–41)
Albumin: 4.1 g/dL (ref 3.5–5.0)
Alkaline Phosphatase: 37 U/L — ABNORMAL LOW (ref 38–126)
Anion gap: 11 (ref 5–15)
BUN: 11 mg/dL (ref 8–23)
CO2: 26 mmol/L (ref 22–32)
Calcium: 9.3 mg/dL (ref 8.9–10.3)
Chloride: 103 mmol/L (ref 98–111)
Creatinine: 0.57 mg/dL (ref 0.44–1.00)
GFR, Estimated: >60 mL/min (ref >60)
Glucose, Bld: 131 mg/dL — ABNORMAL HIGH (ref 70–99)
Potassium: 3.2 mmol/L — ABNORMAL LOW (ref 3.5–5.1)
Sodium: 140 mmol/L (ref 135–145)
Total Bilirubin: 0.6 mg/dL (ref 0.3–1.2)
Total Protein: 6.9 g/dL (ref 6.5–8.1)

## 2022-07-29 LAB — CBC WITH DIFFERENTIAL (CANCER CENTER ONLY)
Abs Immature Granulocytes: 0.06 10*3/uL (ref 0.00–0.07)
Basophils Absolute: 0 10*3/uL (ref 0.0–0.1)
Basophils Relative: 1 %
Eosinophils Absolute: 0.3 10*3/uL (ref 0.0–0.5)
Eosinophils Relative: 5 %
HCT: 34.2 % — ABNORMAL LOW (ref 36.0–46.0)
Hemoglobin: 11.6 g/dL — ABNORMAL LOW (ref 12.0–15.0)
Immature Granulocytes: 1 %
Lymphocytes Relative: 31 %
Lymphs Abs: 1.8 10*3/uL (ref 0.7–4.0)
MCH: 34.2 pg — ABNORMAL HIGH (ref 26.0–34.0)
MCHC: 33.9 g/dL (ref 30.0–36.0)
MCV: 100.9 fL — ABNORMAL HIGH (ref 80.0–100.0)
Monocytes Absolute: 0.7 10*3/uL (ref 0.1–1.0)
Monocytes Relative: 12 %
Neutro Abs: 2.9 10*3/uL (ref 1.7–7.7)
Neutrophils Relative %: 50 %
Platelet Count: 197 10*3/uL (ref 150–400)
RBC: 3.39 MIL/uL — ABNORMAL LOW (ref 3.87–5.11)
RDW: 17 % — ABNORMAL HIGH (ref 11.5–15.5)
WBC Count: 5.7 10*3/uL (ref 4.0–10.5)
nRBC: 0 % (ref 0.0–0.2)

## 2022-07-29 LAB — IRON AND IRON BINDING CAPACITY (CC-WL,HP ONLY)
Iron: 75 ug/dL (ref 28–170)
Saturation Ratios: 23 % (ref 10.4–31.8)
TIBC: 330 ug/dL (ref 250–450)
UIBC: 255 ug/dL (ref 148–442)

## 2022-07-29 LAB — LACTATE DEHYDROGENASE: LDH: 150 U/L (ref 98–192)

## 2022-07-29 LAB — CEA (ACCESS): CEA (CHCC): 5.41 ng/mL — ABNORMAL HIGH (ref 0.00–5.00)

## 2022-07-29 LAB — FERRITIN: Ferritin: 341 ng/mL — ABNORMAL HIGH (ref 11–307)

## 2022-07-29 MED ORDER — SODIUM CHLORIDE 0.9% FLUSH
10.0000 mL | INTRAVENOUS | Status: DC | PRN
  Administered 2022-07-29: 10 mL via INTRAVENOUS

## 2022-07-29 MED ORDER — HEPARIN SOD (PORK) LOCK FLUSH 100 UNIT/ML IV SOLN
500.0000 [IU] | Freq: Once | INTRAVENOUS | Status: AC
  Administered 2022-07-29: 500 [IU] via INTRAVENOUS

## 2022-07-29 NOTE — Progress Notes (Signed)
Hematology and Oncology Follow Up Visit  Bailey Walker [id number] Sep 10, 1946 76 y.o. 07/29/2022   Principle Diagnosis:  StageIIIB [phone]) adenocarcinoma of the sigmoid colon -- 1/16 positive lymph nodes/ pMMR -- recurrent Pernicious anemia   Current Therapy:     FOLFOX -- started on 06/11/2020 --  s/p cycle #8 - complete on 10/22/2020 Vitamin B12 1000 mcg IM every 3 months  FOLFIRI -- s/p cycle #14-- start on 07/31/2021 Neulasta 6 mg sq post chemo -- start on 02/06/2022 SBRT -- LLL nodule -- complete on 05/20/2022 Xeloda 1000 mg p.o. twice daily (14/7) -start in 05/23/2022   Interim History:  Bailey Walker is here today for follow-up.  Bailey Walker now is back from [place].  Bailey Walker had a wonderful time in [place].  Bailey Walker was with family.  I am so happy that Bailey Walker was able to go to [place].  Bailey Walker had little bit of trouble with Xeloda well over there.  Bailey Walker had a lot of fatigue with Xeloda.  Bailey Walker has some skin changes.  Bailey Walker completed her last cycle of Xeloda I think a week ago.  Bailey Walker has had no problems with her colostomy.  There is still some discomfort with the colostomy bag.  Bailey Walker has had no cough or shortness of breath.  Bailey Walker did have radiosurgery for a left lower lung nodule in late August of this year.  Her last CEA level before Bailey Walker left for [place] back in August was 16.2.  Bailey Walker has had no bleeding.  Bailey Walker has had no leg swelling.  Bailey Walker traveled without any difficulty.  Overall, I would have to say that her performance status really is quite good at ECOG 1.  Overall, I would have said that her performance status is probably ECOG 1.     Medications:  Allergies as of 07/29/2022       Reactions   Nitroglycerin Nausea And Vomiting   Ampicillin Other (See Comments)   Per allergy test Per allergy test Per allergy test        Medication List        Accurate as of July 29, 2022  8:05 AM. If you have any questions, ask your nurse or doctor.          alendronate 70 MG tablet Commonly known  as: FOSAMAX Take 70 mg by mouth every Sunday.   aspirin EC 81 MG tablet Take 81 mg by mouth daily. Swallow whole.   atorvastatin 40 MG tablet Commonly known as: LIPITOR Take 1 tablet by mouth daily.   capecitabine 500 MG tablet Commonly known as: Xeloda Take 4 tablets (2,000 mg total) by mouth 2 (two) times daily after a meal. Take as instructed by MD. Start on 06/09/2022   CVS Gentle Laxative 5 MG EC tablet Generic drug: bisacodyl Take 5 mg by mouth daily as needed.   CVS Purelax 17 GM/SCOOP powder Generic drug: polyethylene glycol powder Take 17 g by mouth daily as needed.   dexamethasone 4 MG tablet Commonly known as: DECADRON Take 2 tablets (8 mg total) by mouth daily. Start the day after chemo for 2 days.   diclofenac Sodium 1 % Gel Commonly known as: VOLTAREN Apply 4 g topically 4 (four) times daily.   feeding supplement Liqd Take 237 mLs by mouth 3 (three) times daily between meals.   glipiZIDE 5 MG tablet Commonly known as: GLUCOTROL TAKE 1 TABLET BY MOUTH TWICE A DAY BEFORE MEALS   HYDROcodone-acetaminophen 5-325 MG tablet Commonly known as: NORCO/VICODIN Take 1 tablet  by mouth every 6 (six) hours as needed for moderate pain.   Klor-Con M20 20 MEQ tablet Generic drug: potassium chloride SA TAKE 1 TABLET BY MOUTH TWICE A DAY   levothyroxine 75 MCG tablet Commonly known as: SYNTHROID TAKE 1 TABLET BY MOUTH EVERY DAY   loperamide 2 MG capsule Commonly known as: IMODIUM TAKE 2 CAPSULES (4 MG TOTAL) BY MOUTH AS NEEDED. TAKE 2 AT DIARRHEA ONSET , THEN 1 EVERY 2HR UNTIL 12HRS WITH NO BM. MAY TAKE 2 EVERY 4HRS AT NIGHT. IF DIARRHEA RECURS REPEAT.   metFORMIN 500 MG tablet Commonly known as: GLUCOPHAGE Take 0.5 tablets (250 mg total) by mouth 2 (two) times daily with a meal. What changed: how much to take   methocarbamol 500 MG tablet Commonly known as: ROBAXIN TAKE 1 TABLET BY MOUTH EVERY 12 HOURS AS NEEDED FOR MUSCLE SPASMS.   metoprolol tartrate 25 MG  tablet Commonly known as: LOPRESSOR Take 1 tablet (25 mg total) by mouth 2 (two) times daily.   multivitamin with minerals Tabs tablet Take 1 tablet by mouth daily.   ondansetron 8 MG tablet Commonly known as: ZOFRAN TAKE 1 TABLET (8 MG TOTAL) BY MOUTH 2 (TWO) TIMES DAILY AS NEEDED FOR REFRACTORY NAUSEA / VOMITING. START ON DAY 3 AFTER CHEMOTHERAPY.   pantoprazole 20 MG tablet Commonly known as: PROTONIX Take 1 tablet (20 mg total) by mouth 2 (two) times daily.   prednisoLONE acetate 1 % ophthalmic suspension Commonly known as: PRED FORTE Place 1 drop into both eyes every Monday.   prochlorperazine 10 MG tablet Commonly known as: COMPAZINE Take 1 tablet (10 mg total) by mouth every 6 (six) hours as needed (NAUSEA).   silver sulfADIAZINE 1 % cream Commonly known as: Silvadene Apply 1 application topically daily.   Vitamin D (Ergocalciferol) 1.25 MG (50000 UNIT) Caps capsule Commonly known as: DRISDOL Take 50,000 Units by mouth once a week.        Allergies:  Allergies  Allergen Reactions   Nitroglycerin Nausea And Vomiting   Ampicillin Other (See Comments)    Per allergy test Per allergy test Per allergy test    Past Medical History, Surgical history, Social history, and Family History were reviewed and updated.  Review of Systems: Review of Systems  HENT: Negative.    Eyes: Negative.   Respiratory:  Positive for shortness of breath.   Cardiovascular: Negative.   Gastrointestinal:  Positive for abdominal pain.  Genitourinary: Negative.   Musculoskeletal:  Positive for back pain.  Skin: Negative.   Neurological: Negative.   Endo/Heme/Allergies: Negative.   Psychiatric/Behavioral: Negative.       Physical Exam:  vitals were not taken for this visit.   Wt Readings from Last 3 Encounters:  05/14/22 167 lb (75.8 kg)  05/14/22 167 lb (75.8 kg)  05/05/22 166 lb 4 oz (75.4 kg)    Physical Exam Vitals reviewed.  HENT:     Head: Normocephalic and  atraumatic.  Eyes:     Pupils: Pupils are equal, round, and reactive to light.  Cardiovascular:     Rate and Rhythm: Normal rate and regular rhythm.     Heart sounds: Normal heart sounds.  Pulmonary:     Effort: Pulmonary effort is normal.     Breath sounds: Normal breath sounds.  Abdominal:     General: Bowel sounds are normal.     Palpations: Abdomen is soft.     Comments: Her abdomen is slightly distended.  There is no obvious fluid wave.  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  Bailey Walker still has a dressing over the abdominal laparotomy scar.  Bailey Walker does not have any obvious erythema or warmth.  There is no exudate coming from the scars.  Bailey Walker has decent bowel sounds.  There is no palpable liver or spleen tip.  Musculoskeletal:        General: No tenderness or deformity. Normal range of motion.     Cervical back: Normal range of motion.  Lymphadenopathy:     Cervical: No cervical adenopathy.  Skin:    General: Skin is warm and dry.     Findings: No erythema or rash.  Neurological:     Mental Status: Bailey Walker is alert and oriented to person, place, and time.  Psychiatric:        Behavior: Behavior normal.        Thought Content: Thought content normal.        Judgment: Judgment normal.     Lab Results  Component Value Date   WBC 12.6 (H) 05/14/2022   HGB 10.6 (L) 05/14/2022   HCT 33.1 (L) 05/14/2022   MCV 98.8 05/14/2022   PLT 230 05/14/2022   Lab Results  Component Value Date   FERRITIN 255 05/14/2022   IRON 52 05/14/2022   TIBC 309 05/14/2022   UIBC 257 05/14/2022   IRONPCTSAT 17 05/14/2022   Lab Results  Component Value Date   RETICCTPCT 3.4 (H) 05/14/2022   RBC 3.40 (L) 05/14/2022   No results found for: "KPAFRELGTCHN", "LAMBDASER", "KAPLAMBRATIO" No results found for: "IGGSERUM", "IGA", "IGMSERUM" No results found for: "TOTALPROTELP", "ALBUMINELP", "A1GS", "A2GS", "BETS", "BETA2SER", "GAMS", "MSPIKE", "SPEI"   Chemistry      Component Value Date/Time   NA 140  05/14/2022 0750   NA 139 07/12/2019 1110   K 3.0 (L) 05/14/2022 0750   CL 105 05/14/2022 0750   CO2 25 05/14/2022 0750   BUN 14 05/14/2022 0750   BUN 15 07/12/2019 1110   CREATININE 0.55 05/14/2022 0750   CREATININE 0.74 01/08/2017 1100      Component Value Date/Time   CALCIUM 9.0 05/14/2022 0750   ALKPHOS 75 05/14/2022 0750   AST 19 05/14/2022 0750   ALT 24 05/14/2022 0750   BILITOT 0.4 05/14/2022 0750       Impression and Plan: Bailey Walker is a very pleasant 76 yo Kurdish female with stage IIIB ([id number]) 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   Bailey Walker has recurrent disease.  We are treating with FOLFIRI.  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   We will have to get scans on her.  This is a long wait as well as know how things are going with Xeloda.  I realize that Bailey Walker has some trouble with the Xeloda.  Bailey Walker does have some PPE with Xeloda.  I told her niece to have her try some Voltaren cream which is over-the-counter now.  I think this might be helpful.  We will hold off on any kind of treatment for today at least.  Again need to really see what the CT scan shows.  We will see what her CEA level is.  I am just happy that Bailey Walker was able to have a good time over in [place].  Bailey Walker is able to see family.  It sounds like Bailey Walker may want to go over there permanently.  Bailey Walker does have an oncologist who is actually in the family.  As such,  I really would have no trouble if Bailey Walker went over there to stay and be with her family.  I am not sure when this will happen.  We will see about getting the CT scan done next week or maybe even this week.  I will then plan to get her back in a couple weeks at which point we will be able to decide how we need to continue treatment.   Volanda Napoleon, MD 11/7/20238:05 AM

## 2022-07-29 NOTE — Patient Instructions (Signed)

## 2022-07-30 ENCOUNTER — Encounter: Payer: Self-pay | Admitting: *Deleted

## 2022-08-01 ENCOUNTER — Telehealth: Payer: Self-pay | Admitting: *Deleted

## 2022-08-01 NOTE — Telephone Encounter (Signed)
Per 07/29/22 los - called and gave upcoming appointments - confirmed

## 2022-08-06 ENCOUNTER — Inpatient Hospital Stay: Payer: Medicaid Other

## 2022-08-06 ENCOUNTER — Ambulatory Visit (HOSPITAL_BASED_OUTPATIENT_CLINIC_OR_DEPARTMENT_OTHER)
Admission: RE | Admit: 2022-08-06 | Discharge: 2022-08-06 | Disposition: A | Discharge status: Home / Self Care | Payer: Medicaid Other | Source: Ambulatory Visit | Attending: Hematology & Oncology | Admitting: Hematology & Oncology

## 2022-08-06 ENCOUNTER — Encounter (HOSPITAL_BASED_OUTPATIENT_CLINIC_OR_DEPARTMENT_OTHER): Payer: Self-pay

## 2022-08-06 ENCOUNTER — Encounter (HOSPITAL_COMMUNITY): Payer: Self-pay | Admitting: Nurse Practitioner

## 2022-08-06 DIAGNOSIS — C772 Secondary and unspecified malignant neoplasm of intra-abdominal lymph nodes: Secondary | ICD-10-CM | POA: Diagnosis present

## 2022-08-06 DIAGNOSIS — Z95828 Presence of other vascular implants and grafts: Secondary | ICD-10-CM

## 2022-08-06 DIAGNOSIS — C187 Malignant neoplasm of sigmoid colon: Secondary | ICD-10-CM | POA: Diagnosis present

## 2022-08-06 MED ORDER — SODIUM CHLORIDE 0.9% FLUSH
10.0000 mL | Freq: Once | INTRAVENOUS | Status: AC
  Administered 2022-08-06: 10 mL via INTRAVENOUS

## 2022-08-06 MED ORDER — IOHEXOL 350 MG/ML SOLN: 100.0000 mL | Freq: Once | INTRAVENOUS | Status: DC | PRN

## 2022-08-06 MED ORDER — HEPARIN SOD (PORK) LOCK FLUSH 100 UNIT/ML IV SOLN
500.0000 [IU] | Freq: Once | INTRAVENOUS | Status: AC
  Administered 2022-08-06: 500 [IU] via INTRAVENOUS

## 2022-08-06 MED ORDER — IOHEXOL 300 MG/ML  SOLN
100.0000 mL | Freq: Once | INTRAMUSCULAR | Status: AC | PRN
  Administered 2022-08-06: 100 mL via INTRAVENOUS

## 2022-08-07 ENCOUNTER — Inpatient Hospital Stay: Payer: Medicaid Other

## 2022-08-07 ENCOUNTER — Inpatient Hospital Stay: Payer: Medicaid Other | Admitting: Hematology & Oncology

## 2022-08-07 ENCOUNTER — Telehealth: Payer: Self-pay

## 2022-08-07 ENCOUNTER — Encounter: Payer: Self-pay | Admitting: Hematology & Oncology

## 2022-08-07 ENCOUNTER — Other Ambulatory Visit: Payer: Self-pay

## 2022-08-07 VITALS — BP 124/52 | HR 70 | Temp 98.4°F | Resp 18 | Ht 59.0 in | Wt 159.0 lb

## 2022-08-07 DIAGNOSIS — C772 Secondary and unspecified malignant neoplasm of intra-abdominal lymph nodes: Secondary | ICD-10-CM

## 2022-08-07 DIAGNOSIS — C187 Malignant neoplasm of sigmoid colon: Secondary | ICD-10-CM

## 2022-08-07 LAB — CBC WITH DIFFERENTIAL (CANCER CENTER ONLY)
Abs Immature Granulocytes: 0.01 10*3/uL (ref 0.00–0.07)
Basophils Absolute: 0.1 10*3/uL (ref 0.0–0.1)
Basophils Relative: 1 %
Eosinophils Absolute: 0.3 10*3/uL (ref 0.0–0.5)
Eosinophils Relative: 5 %
HCT: 34.4 % — ABNORMAL LOW (ref 36.0–46.0)
Hemoglobin: 11.5 g/dL — ABNORMAL LOW (ref 12.0–15.0)
Immature Granulocytes: 0 %
Lymphocytes Relative: 28 %
Lymphs Abs: 1.6 10*3/uL (ref 0.7–4.0)
MCH: 33.9 pg (ref 26.0–34.0)
MCHC: 33.4 g/dL (ref 30.0–36.0)
MCV: 101.5 fL — ABNORMAL HIGH (ref 80.0–100.0)
Monocytes Absolute: 0.7 10*3/uL (ref 0.1–1.0)
Monocytes Relative: 13 %
Neutro Abs: 2.9 10*3/uL (ref 1.7–7.7)
Neutrophils Relative %: 53 %
Platelet Count: 168 10*3/uL (ref 150–400)
RBC: 3.39 MIL/uL — ABNORMAL LOW (ref 3.87–5.11)
RDW: 16.7 % — ABNORMAL HIGH (ref 11.5–15.5)
WBC Count: 5.6 10*3/uL (ref 4.0–10.5)
nRBC: 0 % (ref 0.0–0.2)

## 2022-08-07 LAB — FERRITIN: Ferritin: 240 ng/mL (ref 11–307)

## 2022-08-07 LAB — CMP (CANCER CENTER ONLY)
ALT: 20 U/L (ref 0–44)
AST: 21 U/L (ref 15–41)
Albumin: 3.9 g/dL (ref 3.5–5.0)
Alkaline Phosphatase: 39 U/L (ref 38–126)
Anion gap: 10 (ref 5–15)
BUN: 13 mg/dL (ref 8–23)
CO2: 28 mmol/L (ref 22–32)
Calcium: 9.5 mg/dL (ref 8.9–10.3)
Chloride: 101 mmol/L (ref 98–111)
Creatinine: 0.53 mg/dL (ref 0.44–1.00)
GFR, Estimated: >60 mL/min (ref >60)
Glucose, Bld: 124 mg/dL — ABNORMAL HIGH (ref 70–99)
Potassium: 3.7 mmol/L (ref 3.5–5.1)
Sodium: 139 mmol/L (ref 135–145)
Total Bilirubin: 0.7 mg/dL (ref 0.3–1.2)
Total Protein: 6.8 g/dL (ref 6.5–8.1)

## 2022-08-07 LAB — RETICULOCYTES
Immature Retic Fract: 16.5 % — ABNORMAL HIGH (ref 2.3–15.9)
RBC.: 3.35 MIL/uL — ABNORMAL LOW (ref 3.87–5.11)
Retic Count, Absolute: 87.8 10*3/uL (ref 19.0–186.0)
Retic Ct Pct: 2.6 % (ref 0.4–3.1)

## 2022-08-07 LAB — CEA (IN HOUSE-CHCC): CEA (CHCC-In House): 4.53 ng/mL (ref 0.00–5.00)

## 2022-08-07 LAB — IRON AND IRON BINDING CAPACITY (CC-WL,HP ONLY)
Iron: 73 ug/dL (ref 28–170)
Saturation Ratios: 22 % (ref 10.4–31.8)
TIBC: 329 ug/dL (ref 250–450)
UIBC: 256 ug/dL (ref 148–442)

## 2022-08-07 NOTE — Telephone Encounter (Signed)
-----   Message from Volanda Napoleon, MD sent at 08/07/2022  4:07 PM EST ----- Call and let her know that the CEA is actually quite good at 4.5.  Her CT scan seems to show a small new nodule in one of her lungs.  We probably will need to do another radiosurgery procedure on her.  I will call radiation therapy and see about this.  Laurey Arrow

## 2022-08-07 NOTE — Telephone Encounter (Signed)
Advised via MyChart.

## 2022-08-07 NOTE — Progress Notes (Signed)
Hematology and Oncology Follow Up Visit  Bailey Walker [id number] 06-26-1946 76 y.o. 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 twice daily (14/7) -start in 05/23/2022   Interim History:  Bailey Walker is here today for follow-up.  Unfortunately, she does not have the results back from her CT scan.  The CT scan was done yesterday.  I would think though that the CT scan would look okay.  Her CEA level went from 16 down to 5.4.  Obviously, the Xeloda has worked very well.  She has had some toxicity from it.  It is likely that we can decrease the Xeloda from 2000 mg p.o. twice daily down to 1500 mg p.o. twice daily.  She has had no issues with nausea or vomiting.  Her hands are doing a little bit better.  She is using the Voltaren cream for her hands.  Her appetite is doing okay.  She is still planning on moving permanently back to [place].  It sounds like this probably will happen early in 2024.  She really would like to have the colostomy reversed.  I just think that surgery will do this.  I think this would be an incredibly complicated surgery given the fact that she had the wound that had poor healing.  Currently, I would say that her performance status is probably ECOG 1.     Medications:  Allergies as of 08/07/2022       Reactions   Nitroglycerin Nausea And Vomiting   Ampicillin Other (See Comments)   Per allergy test        Medication List        Accurate as of August 07, 2022 10:01 AM. If you have any questions, ask your nurse or doctor.           alendronate 70 MG tablet Commonly known as: FOSAMAX Take 70 mg by mouth every Sunday.   aspirin EC 81 MG tablet Take 81 mg by mouth daily. Swallow whole.   atorvastatin 40 MG tablet Commonly known as: LIPITOR Take 1 tablet by mouth daily.   capecitabine 500 MG tablet Commonly known as: Xeloda Take 4 tablets (2,000 mg total) by mouth 2 (two) times daily after a meal. Take as instructed by MD. Start on 06/09/2022   CVS Gentle Laxative 5 MG EC tablet Generic drug: bisacodyl Take 5 mg by mouth daily as needed.   CVS Purelax 17 GM/SCOOP powder Generic drug: polyethylene glycol powder Take 17 g by mouth daily as needed.   dexamethasone 4 MG tablet Commonly known as: DECADRON Take 2 tablets (8 mg total) by mouth daily. Start the day after chemo for 2 days.   diclofenac Sodium 1 % Gel Commonly known as: VOLTAREN Apply 4 g topically 4 (four) times daily.   feeding supplement Liqd Take 237 mLs by mouth 3 (three) times daily between meals.   glipiZIDE 5 MG tablet Commonly known as: GLUCOTROL TAKE 1 TABLET BY MOUTH TWICE A DAY BEFORE MEALS   HYDROcodone-acetaminophen 5-325 MG tablet Commonly known as: NORCO/VICODIN Take 1 tablet  by mouth every 6 (six) hours as needed for moderate pain.   Klor-Con M20 20 MEQ tablet Generic drug: potassium chloride SA TAKE 1 TABLET BY MOUTH TWICE A DAY   levothyroxine 75 MCG tablet Commonly known as: SYNTHROID TAKE 1 TABLET BY MOUTH EVERY DAY   loperamide 2 MG capsule Commonly known as: IMODIUM TAKE 2 CAPSULES (4 MG TOTAL) BY MOUTH AS NEEDED. TAKE 2 AT DIARRHEA ONSET , THEN 1 EVERY 2HR UNTIL 12HRS WITH NO BM. MAY TAKE 2 EVERY 4HRS AT NIGHT. IF DIARRHEA RECURS REPEAT.   metFORMIN 500 MG tablet Commonly known as: GLUCOPHAGE Take 0.5 tablets (250 mg total) by mouth 2 (two) times daily with a meal. What changed: how much to take   methocarbamol 500 MG tablet Commonly known as: ROBAXIN TAKE 1 TABLET BY MOUTH EVERY 12 HOURS AS NEEDED FOR  MUSCLE SPASMS.   metoprolol tartrate 25 MG tablet Commonly known as: LOPRESSOR Take 1 tablet (25 mg total) by mouth 2 (two) times daily.   multivitamin with minerals Tabs tablet Take 1 tablet by mouth daily.   ondansetron 8 MG tablet Commonly known as: ZOFRAN TAKE 1 TABLET (8 MG TOTAL) BY MOUTH 2 (TWO) TIMES DAILY AS NEEDED FOR REFRACTORY NAUSEA / VOMITING. START ON DAY 3 AFTER CHEMOTHERAPY.   pantoprazole 20 MG tablet Commonly known as: PROTONIX Take 1 tablet (20 mg total) by mouth 2 (two) times daily.   prednisoLONE acetate 1 % ophthalmic suspension Commonly known as: PRED FORTE Place 1 drop into both eyes every Monday.   prochlorperazine 10 MG tablet Commonly known as: COMPAZINE Take 1 tablet (10 mg total) by mouth every 6 (six) hours as needed (NAUSEA).   silver sulfADIAZINE 1 % cream Commonly known as: Silvadene Apply 1 application topically daily.   Vitamin D (Ergocalciferol) 1.25 MG (50000 UNIT) Caps capsule Commonly known as: DRISDOL Take 50,000 Units by mouth once a week.        Allergies:  Allergies  Allergen Reactions   Nitroglycerin Nausea And Vomiting   Ampicillin Other (See Comments)    Per allergy test     Past Medical History, Surgical history, Social history, and Family History were reviewed and updated.  Review of Systems: Review of Systems  HENT: Negative.    Eyes: Negative.   Respiratory:  Positive for shortness of breath.   Cardiovascular: Negative.   Gastrointestinal:  Positive for abdominal pain.  Genitourinary: Negative.   Musculoskeletal:  Positive for back pain.  Skin: Negative.   Neurological: Negative.   Endo/Heme/Allergies: Negative.   Psychiatric/Behavioral: Negative.       Physical Exam:  height is _0  (1.499 m) and weight is 159 lb (72.1 kg). Her oral temperature is 98.4 F (36.9 C). Her blood pressure is 124/52 (abnormal) and her pulse is 70. Her respiration is 18 and oxygen saturation is 100%.   Wt Readings from  Last 3 Encounters:  08/07/22 159 lb (72.1 kg)  07/29/22 156 lb 6.4 oz (70.9 kg)  05/14/22 167 lb (75.8 kg)    Physical Exam Vitals reviewed.  HENT:     Head: Normocephalic and atraumatic.  Eyes:     Pupils: Pupils are equal, round, and reactive to light.  Cardiovascular:     Rate and Rhythm: Normal rate and regular rhythm.     Heart sounds: Normal heart sounds.  Pulmonary:     Effort: Pulmonary effort is normal.     Breath sounds: Normal breath sounds.  Abdominal:     General: Bowel  sounds are normal.     Palpations: Abdomen is soft.     Comments: Her abdomen is slightly distended.  There is no obvious fluid wave.  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  She still has a dressing over the abdominal laparotomy scar.  She does not have any obvious erythema or warmth.  There is no exudate coming from the scars.  She has decent bowel sounds.  There is no palpable liver or spleen tip.  Musculoskeletal:        General: No tenderness or deformity. Normal range of motion.     Cervical back: Normal range of motion.  Lymphadenopathy:     Cervical: No cervical adenopathy.  Skin:    General: Skin is warm and dry.     Findings: No erythema or rash.  Neurological:     Mental Status: She is alert and oriented to person, place, and time.  Psychiatric:        Behavior: Behavior normal.        Thought Content: Thought content normal.        Judgment: Judgment normal.      Lab Results  Component Value Date   WBC 5.6 08/07/2022   HGB 11.5 (L) 08/07/2022   HCT 34.4 (L) 08/07/2022   MCV 101.5 (H) 08/07/2022   PLT 168 08/07/2022   Lab Results  Component Value Date   FERRITIN 341 (H) 07/29/2022   IRON 75 07/29/2022   TIBC 330 07/29/2022   UIBC 255 07/29/2022   IRONPCTSAT 23 07/29/2022   Lab Results  Component Value Date   RETICCTPCT 2.6 08/07/2022   RBC 3.39 (L) 08/07/2022   RBC 3.35 (L) 08/07/2022   No results found for: "KPAFRELGTCHN", "LAMBDASER", "KAPLAMBRATIO" No  results found for: "IGGSERUM", "IGA", "IGMSERUM" No results found for: "TOTALPROTELP", "ALBUMINELP", "A1GS", "A2GS", "BETS", "BETA2SER", "GAMS", "MSPIKE", "SPEI"   Chemistry      Component Value Date/Time   NA 140 07/29/2022 0754   NA 139 07/12/2019 1110   K 3.2 (L) 07/29/2022 0754   CL 103 07/29/2022 0754   CO2 26 07/29/2022 0754   BUN 11 07/29/2022 0754   BUN 15 07/12/2019 1110   CREATININE 0.57 07/29/2022 0754   CREATININE 0.74 01/08/2017 1100      Component Value Date/Time   CALCIUM 9.3 07/29/2022 0754   ALKPHOS 37 (L) 07/29/2022 0754   AST 23 07/29/2022 0754   ALT 20 07/29/2022 0754   BILITOT 0.6 07/29/2022 0754       Impression and Plan: Bailey Walker is a very pleasant 76 yo Kurdish female with stage IIIB ([id number]) 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   She has recurrent disease.  We are treating with FOLFIRI.  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   I would think that the CEA level should be a good indicator that she has responded.  Got to mention that she did have the radiosurgery for the lung nodule.  I think this also has worked.  I think now, it is a matter of just trying to mitigate toxicity.  I think if we decrease her Xeloda dose to 1500 mg p.o. twice daily, that would not be a bad idea.  Again were not sure when she is going to move over to [place].  Again it sounds like this will be an early 2024.  Hopefully, the CT scan results will come out this week.  I will tell her  to get back onto the Xeloda.  Again I think this is working.  I will like to see her back in about 3 or 4 weeks.  I suppose that we could always add Avastin.  However, I would just worry about the abdominal wound.  This is taking a long, long time to heal up.  I would just hate to see anything happen with the abdominal wound that could affect her quality of life.   Volanda Napoleon, MD 11/16/202310:01 AM

## 2022-08-08 ENCOUNTER — Telehealth: Payer: Self-pay | Admitting: *Deleted

## 2022-08-08 NOTE — Telephone Encounter (Signed)
Message received from patient's niece [name] requesting a refill of Glipizide for pt.  Call placed back to [name] and [name] notified to contact pt.'s PCP for refill of Glipizide. [name] states that she will contact pt.'s PCP for refill.

## 2022-08-12 NOTE — Progress Notes (Incomplete)
  Location of tumor and Histology per Pathology Report:   Biopsy: NA  Past/Anticipated interventions by surgeon, if any: None at this time.   Past/Anticipated interventions by medical oncology, if any:     Pain issues, if any:  {:18581} {PAIN DESCRIPTION:21022940}  SAFETY ISSUES: Prior radiation? Yes  Pacemaker/ICD? {:18581} Possible current pregnancy? no Is the patient on methotrexate? {:18581}  Current Complaints / other details:  ***     ***

## 2022-08-13 ENCOUNTER — Other Ambulatory Visit (HOSPITAL_COMMUNITY): Payer: Self-pay

## 2022-08-16 NOTE — Progress Notes (Incomplete)
Radiation Oncology         ([phone]) [phone] ________________________________  Name: Bailey Walker MRN: [id number]  Date: 08/18/2022  DOB: [birth year]  Outpatient Re-Consultation   CC: [name], MD  Volanda Napoleon, MD  No diagnosis found.  Diagnosis: New right lower lobe pulmonary nodule   The primary encounter diagnosis was 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 ([place]). A diagnosis of Lung nodule was also pertinent to this visit.   Stage IIIB ([id number]) metastatic adenocarcinoma of the sigmoid colon; adnexal mass, and metastatic disease to the lung and an intra-abdominal lymph node - s/p sigmoid colectomy and chemotherapy  Interval Since Last Radiation: 2 month and 30 days   Intent: Curative  Radiation Treatment Dates: 05/13/2022 through 05/19/2022 Site Technique Total Dose (Gy) Dose per Fx (Gy) Completed Fx Beam Energies  Lung, Left: Lung_L IMRT 54/54 18 3/3 [id number]    Narrative:  The patient returns today, courtesy of Dr. Marin Olp, for re-evaluation and and consideration of radiation therapy in management of a new RLL pulmonary nodule. The patient did not present for her last follow-up visit with me scheduled for 07/21/22. Given so, her interval history since her initial consultation date this past August is detailed below.    The patient tolerated radiation therapy relatively well. On the date of her final treatment, the patient reported pain at her ostomy site, fatigue, and occasional SOB and cough. She denied any other symptoms.   In the midst of radiation treatment, the patient followed up with Dr. Marin Olp on 05/14/22. Given that the patient was leaving on 05/23/22 to visit her family in [place], Dr. Marin Olp approved for her to receive her last cycle of chemotherapy before she left. He also arranged for her to start Xeloda while overseas. The patient's niece was able to arrange for her to have blood work done while in [place]. Dr. Marin Olp has reviewed a  copy of the report which shows stable findings.   Of note: The patient presented to the ED on 05/15/22 after a fall which resulted in some pelvic pain. Pelvic x-ray showed no evidence of acute fracture or dislocation.  New RLL pulmonary nodule:   After the patient returned from visiting her family in [place], Dr. Marin Olp ordered a follow-up CT CAP on 08/06/22 to assess her response to Xeloda. This revealed a new solid pulmonary nodule of the right lower lobe measuring 6 mm, concerning for progressive metastatic disease. CT otherwise showed stability of the other previously seen pulmonary nodules, stable peritoneal mesenteric disease, and stability of the lobulated mass of the left adnexa.    For the new RLL nodule, Dr. Marin Olp recommends another course of radiation therapy which we will discuss in detail today. In terms of Xeloda, she seems to have some toxicity from treatment, and she will be decreased to 1500 mg p.o. twice daily per Dr. Marin Olp. She is otherwise tolerating and responding well to Xeloda.    ***  Allergies:  is allergic to nitroglycerin and ampicillin.  Meds: Current Outpatient Medications  Medication Sig Dispense Refill   alendronate (FOSAMAX) 70 MG tablet Take 70 mg by mouth every Sunday.     aspirin EC 81 MG tablet Take 81 mg by mouth daily. Swallow whole.     atorvastatin (LIPITOR) 40 MG tablet Take 1 tablet by mouth daily.     capecitabine (XELODA) 500 MG tablet Take 4 tablets (2,000 mg total) by mouth 2 (two) times daily after a meal. Take as instructed by MD.  Start on 06/09/2022 224 tablet 1   CVS GENTLE LAXATIVE 5 MG EC tablet Take 5 mg by mouth daily as needed.     CVS PURELAX 17 GM/SCOOP powder Take 17 g by mouth daily as needed.     dexamethasone (DECADRON) 4 MG tablet Take 2 tablets (8 mg total) by mouth daily. Start the day after chemo for 2 days. 8 tablet 5   diclofenac Sodium (VOLTAREN) 1 % GEL Apply 4 g topically 4 (four) times daily. 100 g 0   feeding  supplement, ENSURE ENLIVE, (ENSURE ENLIVE) LIQD Take 237 mLs by mouth 3 (three) times daily between meals. 237 mL 12   glipiZIDE (GLUCOTROL) 5 MG tablet TAKE 1 TABLET BY MOUTH TWICE A DAY BEFORE MEALS 60 tablet 3   HYDROcodone-acetaminophen (NORCO/VICODIN) 5-325 MG tablet Take 1 tablet by mouth every 6 (six) hours as needed for moderate pain. 60 tablet 0   KLOR-CON M20 20 MEQ tablet TAKE 1 TABLET BY MOUTH TWICE A DAY 30 tablet 4   levothyroxine (SYNTHROID) 75 MCG tablet TAKE 1 TABLET BY MOUTH EVERY DAY 30 tablet 0   loperamide (IMODIUM) 2 MG capsule TAKE 2 CAPSULES (4 MG TOTAL) BY MOUTH AS NEEDED. TAKE 2 AT DIARRHEA ONSET , THEN 1 EVERY 2HR UNTIL 12HRS WITH NO BM. MAY TAKE 2 EVERY 4HRS AT NIGHT. IF DIARRHEA RECURS REPEAT. 90 capsule 2   metFORMIN (GLUCOPHAGE) 500 MG tablet Take 0.5 tablets (250 mg total) by mouth 2 (two) times daily with a meal. (Patient taking differently: Take 500 mg by mouth 2 (two) times daily with a meal.) 90 tablet 3   methocarbamol (ROBAXIN) 500 MG tablet TAKE 1 TABLET BY MOUTH EVERY 12 HOURS AS NEEDED FOR MUSCLE SPASMS. 60 tablet 2   metoprolol tartrate (LOPRESSOR) 25 MG tablet Take 1 tablet (25 mg total) by mouth 2 (two) times daily. 180 tablet 3   Multiple Vitamin (MULTIVITAMIN WITH MINERALS) TABS tablet Take 1 tablet by mouth daily. 30 tablet 0   ondansetron (ZOFRAN) 8 MG tablet TAKE 1 TABLET (8 MG TOTAL) BY MOUTH 2 (TWO) TIMES DAILY AS NEEDED FOR REFRACTORY NAUSEA / VOMITING. START ON DAY 3 AFTER CHEMOTHERAPY. 30 tablet 1   pantoprazole (PROTONIX) 20 MG tablet Take 1 tablet (20 mg total) by mouth 2 (two) times daily. 60 tablet 3   prednisoLONE acetate (PRED FORTE) 1 % ophthalmic suspension Place 1 drop into both eyes every Monday.     prochlorperazine (COMPAZINE) 10 MG tablet Take 1 tablet (10 mg total) by mouth every 6 (six) hours as needed (NAUSEA). 30 tablet 1   silver sulfADIAZINE (SILVADENE) 1 % cream Apply 1 application topically daily. 50 g 2   Vitamin D,  Ergocalciferol, (DRISDOL) 1.25 MG (50000 UNIT) CAPS capsule Take 50,000 Units by mouth once a week.     No current facility-administered medications for this encounter.   Facility-Administered Medications Ordered in Other Encounters  Medication Dose Route Frequency Provider Last Rate Last Admin   0.9 %  sodium chloride infusion   Intravenous Once Ennever, Rudell Cobb, MD       0.9 %  sodium chloride infusion   Intravenous Once Ennever, Rudell Cobb, MD       clindamycin (CLEOCIN) 900 mg in dextrose 5 % 50 mL IVPB  900 mg Intravenous 60 min Pre-Op Michael Boston, MD       And   gentamicin (GARAMYCIN) 5 mg/kg in dextrose 5 % 50 mL IVPB  5 mg/kg Intravenous 60 min Pre-Op Gross,  Remo Lipps, MD       [id number] [facility]) 6 MG/0.6ML injection             Physical Findings: The patient is in no acute distress. Patient is alert and oriented.  vitals were not taken for this visit. .  No significant changes. Lungs are clear to auscultation bilaterally. Heart has regular rate and rhythm. No palpable cervical, supraclavicular, or axillary adenopathy. Abdomen soft, non-tender, normal bowel sounds.   Lab Findings: Lab Results  Component Value Date   WBC 5.6 08/07/2022   HGB 11.5 (L) 08/07/2022   HCT 34.4 (L) 08/07/2022   MCV 101.5 (H) 08/07/2022   PLT 168 08/07/2022    Radiographic Findings: CT CHEST ABDOMEN PELVIS W CONTRAST  Result Date: 08/07/2022 CLINICAL DATA:  Colon cancer; * Tracking Code: BO * EXAM: CT CHEST, ABDOMEN, AND PELVIS WITH CONTRAST TECHNIQUE: Multidetector CT imaging of the chest, abdomen and pelvis was performed following the standard protocol during bolus administration of intravenous contrast. RADIATION DOSE REDUCTION: This exam was performed according to the departmental dose-optimization program which includes automated exposure control, adjustment of the mA and/or kV according to patient size and/or use of iterative reconstruction technique. CONTRAST:  149m OMNIPAQUE IOHEXOL  300 MG/ML  SOLN COMPARISON:  CT chest, abdomen and pelvis dated April 05, 2022 FINDINGS: CT CHEST FINDINGS Cardiovascular: Normal heart size. No pericardial effusion. Normal caliber thoracic aorta with moderate calcified plaque. Mild coronary artery calcifications. Suspicious filling defects of the central pulmonary arteries. Mediastinum/Nodes: Small hiatal hernia. Stable calcified left thyroid nodule. No pathologically enlarged nodes seen in the chest Lungs/Pleura: Central airways are patent. Bilateral solid pulmonary nodules. New solid pulmonary nodule of the right lower lobe measuring 6 mm on series 4, image 100 other previously seen pulmonary nodules are stable. Reference nodule of the superior left lower lobe measuring 16 x 10 mm on series 4, image 67, unchanged. Musculoskeletal: No chest wall mass or suspicious bone lesions identified. CT ABDOMEN PELVIS FINDINGS Hepatobiliary: Peripheral arterially enhancing lesion of the right lobe of the liver seen on series 2, image 71, likely perfusional. Linear area of hypoattenuation near the falciform ligament, likely focal fatty infiltration. No suspicious liver lesions. Gallbladder is unremarkable. No biliary ductal dilation. Pancreas: Unremarkable. No pancreatic ductal dilatation or surrounding inflammatory changes. Spleen: Normal in size without focal abnormality. Adrenals/Urinary Tract: Adrenal glands are unremarkable. Kidneys are normal, without renal calculi, focal lesion, or hydronephrosis. Bladder is unremarkable. Stomach/Bowel: Normal appearance of the stomach. Prior sigmoidectomy with left lower quadrant colostomy. Large parastomal hernia containing nondilated loops of small bowel. No bowel wall thickening or evidence of obstruction. Vascular/Lymphatic: Aortic atherosclerosis. No enlarged abdominal or pelvic lymph nodes. Reproductive: Lobulated mass of the left adnexa measuring 5.7 x 3.2 cm, unchanged. Other: Peritoneal nodules and mesenteric thickening,  unchanged when compared with the prior. Reference right lower quadrant peritoneal nodule measuring 0.9 x 1.6 cm on series 2, image 79, unchanged. Musculoskeletal: No acute or significant osseous findings. IMPRESSION: 1. New solid pulmonary nodule of the right lower lobe measuring 6 mm, concerning for progressive metastatic disease. Other previously seen pulmonary nodules are stable. 2. Stable peritoneal mesenteric disease. 3. Stable lobulated mass of the left adnexa. Electronically Signed   By: LYetta GlassmanM.D.   On: 08/07/2022 10:41    Impression:  New right lower lobe pulmonary nodule   The primary encounter diagnosis was 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 [place]. A diagnosis of Lung nodule was also pertinent to this visit.  Stage IIIB ([id number]) metastatic adenocarcinoma of the sigmoid colon; adnexal mass, and metastatic disease to the lung and an intra-abdominal lymph node - s/p sigmoid colectomy and chemotherapy  The patient is recovering from the effects of radiation.  ***  Plan:  ***   *** minutes of total time was spent for this patient encounter, including preparation, face-to-face counseling with the patient and coordination of care, physical exam, and documentation of the encounter. ____________________________________  Blair Promise, PhD, MD  This document serves as a record of services personally performed by Gery Pray, MD. It was created on his behalf by Roney Mans, a trained medical scribe. The creation of this record is based on the scribe's personal observations and the provider's statements to them. This document has been checked and approved by the attending provider.

## 2022-08-18 ENCOUNTER — Ambulatory Visit
Admission: RE | Admit: 2022-08-18 | Discharge: 2022-08-18 | Disposition: A | Discharge status: Home / Self Care | Payer: Medicaid Other | Source: Ambulatory Visit | Attending: Radiation Oncology | Admitting: Radiation Oncology

## 2022-08-18 ENCOUNTER — Ambulatory Visit: Payer: Medicaid Other

## 2022-08-18 ENCOUNTER — Telehealth: Payer: Self-pay

## 2022-08-18 NOTE — Telephone Encounter (Signed)
Call placed to patient's niece due to patient not showing up for 830 am nurse consult. No answer and voicemail full. Will continue to attempt to reach out to patient.

## 2022-08-19 NOTE — Progress Notes (Incomplete)
Radiation Oncology         [phone] ________________________________  Name: Bailey Walker MRN: [id number]  Date: 08/20/2022  DOB: 1945/12/10  Outpatient Re-Consultation   CC: [name], MD  Volanda Napoleon, MD  No diagnosis found.  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 A diagnosis of Lung nodule was also pertinent to this visit.   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 Marin Olp, for re-evaluation and consideration of radiation therapy in management of a new RLL pulmonary nodule. The patient did not present for her last follow-up visit with me scheduled for 07/21/22. Given so, her interval history since her initial consultation date this past August is detailed below.    The patient tolerated radiation therapy relatively well. On the date of her final treatment, the patient reported pain at her ostomy site, fatigue, and occasional SOB and cough. She denied any other symptoms.   In the midst of radiation treatment, the patient followed up with Dr. Marin Olp on 05/14/22. Given that the patient was leaving on 05/23/22 to visit her family in [place], Dr. Marin Olp approved for her to receive her last cycle of chemotherapy before she left. He also arranged for her to start Xeloda while overseas. The patient's niece was able to arrange for her to have blood work done while in [place]. Dr. Marin Olp has reviewed a copy of the  report which shows stable findings.   Of note: The patient presented to the ED on 05/15/22 after a fall which resulted in some pelvic pain. Pelvic x-ray showed no evidence of acute fracture or dislocation.  New RLL pulmonary nodule:   After the patient returned from visiting her family in [place], Dr. Marin Olp ordered a follow-up CT CAP on 08/06/22 to assess her response to Xeloda. This revealed a new solid pulmonary nodule of the right lower lobe measuring 6 mm, concerning for progressive metastatic disease. CT otherwise showed stability of the other previously seen pulmonary nodules, stable peritoneal mesenteric disease, and stability of the lobulated mass of the left adnexa.    For the new RLL nodule, Dr. Marin Olp recommends another course of radiation therapy which we will discuss in detail today. In terms of Xeloda, she seems to have some toxicity from treatment, and she will be decreased to 1500 mg p.o. twice daily per Dr. Marin Olp. She is otherwise tolerating and responding well to Xeloda.    ***  Allergies:  is allergic to nitroglycerin and ampicillin.  Meds: Current Outpatient Medications  Medication Sig Dispense Refill   alendronate (FOSAMAX) 70 MG tablet Take 70 mg by mouth every Sunday.     aspirin EC 81 MG tablet Take 81 mg by mouth daily. Swallow whole.     atorvastatin (LIPITOR) 40 MG tablet Take 1 tablet by mouth daily.     capecitabine (XELODA) 500 MG tablet Take 4 tablets (2,000 mg total) by mouth 2 (two) times daily after a meal. Take as instructed by MD. Start on 06/09/2022 224 tablet  1   [employer] GENTLE LAXATIVE 5 MG EC tablet Take 5 mg by mouth daily as needed.     [employer] PURELAX 17 GM/SCOOP powder Take 17 g by mouth daily as needed.     dexamethasone (DECADRON) 4 MG tablet Take 2 tablets (8 mg total) by mouth daily. Start the day after chemo for 2 days. 8 tablet 5   diclofenac Sodium (VOLTAREN) 1 % GEL Apply 4 g topically 4 (four) times daily. 100 g 0   feeding supplement, ENSURE  ENLIVE, (ENSURE ENLIVE) LIQD Take 237 mLs by mouth 3 (three) times daily between meals. 237 mL 12   glipiZIDE (GLUCOTROL) 5 MG tablet TAKE 1 TABLET BY MOUTH TWICE A DAY BEFORE MEALS 60 tablet 3   HYDROcodone-acetaminophen (NORCO/VICODIN) 5-325 MG tablet Take 1 tablet by mouth every 6 (six) hours as needed for moderate pain. 60 tablet 0   KLOR-CON M20 20 MEQ tablet TAKE 1 TABLET BY MOUTH TWICE A DAY 30 tablet 4   levothyroxine (SYNTHROID) 75 MCG tablet TAKE 1 TABLET BY MOUTH EVERY DAY 30 tablet 0   loperamide (IMODIUM) 2 MG capsule TAKE 2 CAPSULES (4 MG TOTAL) BY MOUTH AS NEEDED. TAKE 2 AT DIARRHEA ONSET , THEN 1 EVERY 2HR UNTIL 12HRS WITH NO BM. MAY TAKE 2 EVERY 4HRS AT NIGHT. IF DIARRHEA RECURS REPEAT. 90 capsule 2   metFORMIN (GLUCOPHAGE) 500 MG tablet Take 0.5 tablets (250 mg total) by mouth 2 (two) times daily with a meal. (Patient taking differently: Take 500 mg by mouth 2 (two) times daily with a meal.) 90 tablet 3   methocarbamol (ROBAXIN) 500 MG tablet TAKE 1 TABLET BY MOUTH EVERY 12 HOURS AS NEEDED FOR MUSCLE SPASMS. 60 tablet 2   metoprolol tartrate (LOPRESSOR) 25 MG tablet Take 1 tablet (25 mg total) by mouth 2 (two) times daily. 180 tablet 3   Multiple Vitamin (MULTIVITAMIN WITH MINERALS) TABS tablet Take 1 tablet by mouth daily. 30 tablet 0   ondansetron (ZOFRAN) 8 MG tablet TAKE 1 TABLET (8 MG TOTAL) BY MOUTH 2 (TWO) TIMES DAILY AS NEEDED FOR REFRACTORY NAUSEA / VOMITING. START ON DAY 3 AFTER CHEMOTHERAPY. 30 tablet 1   pantoprazole (PROTONIX) 20 MG tablet Take 1 tablet (20 mg total) by mouth 2 (two) times daily. 60 tablet 3   prednisoLONE acetate (PRED FORTE) 1 % ophthalmic suspension Place 1 drop into both eyes every Monday.     prochlorperazine (COMPAZINE) 10 MG tablet Take 1 tablet (10 mg total) by mouth every 6 (six) hours as needed (NAUSEA). 30 tablet 1   silver sulfADIAZINE (SILVADENE) 1 % cream Apply 1 application topically daily. 50 g 2   Vitamin D, Ergocalciferol, (DRISDOL)  1.25 MG (50000 UNIT) CAPS capsule Take 50,000 Units by mouth once a week.     No current facility-administered medications for this encounter.   Facility-Administered Medications Ordered in Other Encounters  Medication Dose Route Frequency Provider Last Rate Last Admin   0.9 %  sodium chloride infusion   Intravenous Once Ennever, Rudell Cobb, MD       0.9 %  sodium chloride infusion   Intravenous Once Ennever, Rudell Cobb, MD       clindamycin (CLEOCIN) 900 mg in dextrose 5 % 50 mL IVPB  900 mg Intravenous 60 min Pre-Op Michael Boston, MD       And   gentamicin (GARAMYCIN) 5 mg/kg in dextrose 5 % 50 mL IVPB  5 mg/kg Intravenous 60 min Pre-Op Michael Boston, MD  pegfilgrastim-cbqv (UDENYCA) 6 MG/0.6ML injection             Physical Findings: The patient is in no acute distress. Patient is alert and oriented.  vitals were not taken for this visit. .  No significant changes. Lungs are clear to auscultation bilaterally. Heart has regular rate and rhythm. No palpable cervical, supraclavicular, or axillary adenopathy. Abdomen soft, non-tender, normal bowel sounds.   Lab Findings: Lab Results  Component Value Date   WBC 5.6 08/07/2022   HGB 11.5 (L) 08/07/2022   HCT 34.4 (L) 08/07/2022   MCV 101.5 (H) 08/07/2022   PLT 168 08/07/2022    Radiographic Findings: CT CHEST ABDOMEN PELVIS W CONTRAST  Result Date: 08/07/2022 CLINICAL DATA:  Colon cancer; * Tracking Code: BO * EXAM: CT CHEST, ABDOMEN, AND PELVIS WITH CONTRAST TECHNIQUE: Multidetector CT imaging of the chest, abdomen and pelvis was performed following the standard protocol during bolus administration of intravenous contrast. RADIATION DOSE REDUCTION: This exam was performed according to the departmental dose-optimization program which includes automated exposure control, adjustment of the mA and/or kV according to patient size and/or use of iterative reconstruction technique. CONTRAST:  169m OMNIPAQUE IOHEXOL 300 MG/ML  SOLN COMPARISON:   CT chest, abdomen and pelvis dated April 05, 2022 FINDINGS: CT CHEST FINDINGS Cardiovascular: Normal heart size. No pericardial effusion. Normal caliber thoracic aorta with moderate calcified plaque. Mild coronary artery calcifications. Suspicious filling defects of the central pulmonary arteries. Mediastinum/Nodes: Small hiatal hernia. Stable calcified left thyroid nodule. No pathologically enlarged nodes seen in the chest Lungs/Pleura: Central airways are patent. Bilateral solid pulmonary nodules. New solid pulmonary nodule of the right lower lobe measuring 6 mm on series 4, image 100 other previously seen pulmonary nodules are stable. Reference nodule of the superior left lower lobe measuring 16 x 10 mm on series 4, image 67, unchanged. Musculoskeletal: No chest wall mass or suspicious bone lesions identified. CT ABDOMEN PELVIS FINDINGS Hepatobiliary: Peripheral arterially enhancing lesion of the right lobe of the liver seen on series 2, image 71, likely perfusional. Linear area of hypoattenuation near the falciform ligament, likely focal fatty infiltration. No suspicious liver lesions. Gallbladder is unremarkable. No biliary ductal dilation. Pancreas: Unremarkable. No pancreatic ductal dilatation or surrounding inflammatory changes. Spleen: Normal in size without focal abnormality. Adrenals/Urinary Tract: Adrenal glands are unremarkable. Kidneys are normal, without renal calculi, focal lesion, or hydronephrosis. Bladder is unremarkable. Stomach/Bowel: Normal appearance of the stomach. Prior sigmoidectomy with left lower quadrant colostomy. Large parastomal hernia containing nondilated loops of small bowel. No bowel wall thickening or evidence of obstruction. Vascular/Lymphatic: Aortic atherosclerosis. No enlarged abdominal or pelvic lymph nodes. Reproductive: Lobulated mass of the left adnexa measuring 5.7 x 3.2 cm, unchanged. Other: Peritoneal nodules and mesenteric thickening, unchanged when compared with the  prior. Reference right lower quadrant peritoneal nodule measuring 0.9 x 1.6 cm on series 2, image 79, unchanged. Musculoskeletal: No acute or significant osseous findings. IMPRESSION: 1. New solid pulmonary nodule of the right lower lobe measuring 6 mm, concerning for progressive metastatic disease. Other previously seen pulmonary nodules are stable. 2. Stable peritoneal mesenteric disease. 3. Stable lobulated mass of the left adnexa. Electronically Signed   By: LYetta GlassmanM.D.   On: 08/07/2022 10:41    Impression:  New right lower lobe pulmonary nodule   The primary encounter diagnosis was 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 ([place]. A diagnosis of Lung nodule was also pertinent to this visit.   Stage IIIB (([id number] metastatic adenocarcinoma of  the sigmoid colon; adnexal mass, and metastatic disease to the lung and an intra-abdominal lymph node - s/p sigmoid colectomy and chemotherapy  The patient is recovering from the effects of radiation.  ***  Plan:  ***   *** minutes of total time was spent for this patient encounter, including preparation, face-to-face counseling with the patient and coordination of care, physical exam, and documentation of the encounter. ____________________________________  Blair Promise, PhD, MD  This document serves as a record of services personally performed by Gery Pray, MD. It was created on his behalf by Roney Mans, a trained medical scribe. The creation of this record is based on the scribe's personal observations and the provider's statements to them. This document has been checked and approved by the attending provider.

## 2022-08-20 ENCOUNTER — Ambulatory Visit
Admission: RE | Admit: 2022-08-20 | Discharge: 2022-08-20 | Disposition: A | Discharge status: Home / Self Care | Payer: Medicaid Other | Source: Ambulatory Visit | Attending: Radiation Oncology | Admitting: Radiation Oncology

## 2022-08-20 ENCOUNTER — Telehealth: Payer: Self-pay | Admitting: Radiation Oncology

## 2022-08-20 ENCOUNTER — Ambulatory Visit: Payer: Medicaid Other

## 2022-08-20 NOTE — Telephone Encounter (Signed)
Called patient to reschedule her missed appointment. No answer, LVM for a return call.

## 2022-08-20 NOTE — Progress Notes (Signed)
Radiation Oncology         [phone] ________________________________  Name: Bailey Walker MRN: [id number]  Date: 08/21/2022  DOB: [birth year]  Outpatient Re-Consultation   CC: [name], MD  Volanda Napoleon, MD  No diagnosis found.  Diagnosis: New right lower lobe pulmonary nodule   The primary encounter diagnosis was 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 ([place]). A diagnosis of Lung nodule was also pertinent to this visit.   Stage IIIB ([id number]) metastatic adenocarcinoma of the sigmoid colon; adnexal mass, and metastatic disease to the lung and an intra-abdominal lymph node - s/p sigmoid colectomy and chemotherapy  Interval Since Last Radiation: 3 months and 2 days   Intent: Curative  Radiation Treatment Dates: 05/13/2022 through 05/19/2022 Site Technique Total Dose (Gy) Dose per Fx (Gy) Completed Fx Beam Energies  Lung, Left: Lung_L IMRT 54/54 18 3/3 6XFFF    Narrative:  The patient returns today, courtesy of Dr. Marin Olp, for re-evaluation and consideration of radiation therapy in management of a new RLL pulmonary nodule. The patient did not present for her last follow-up visit with me scheduled for 07/21/22. Given so, her interval history since her initial consultation date this past August is detailed below.    The patient tolerated radiation therapy relatively well. On the date of her final treatment, the patient reported pain at her ostomy site, fatigue, and occasional SOB and cough. She denied any other symptoms.   In the midst of radiation treatment, the patient followed up with Dr. Marin Olp on 05/14/22. Given that the patient was leaving on 05/23/22 to visit her family in [place], Dr. Marin Olp approved for her to receive her last cycle of chemotherapy before she left. He also arranged for her to start Xeloda while overseas. The patient's niece was able to arrange for her to have blood work done while in [place]. Dr. Marin Olp has reviewed a copy  of the report which shows stable findings.   Of note: The patient presented to the ED on 05/15/22 after a fall which resulted in some pelvic pain. Pelvic x-ray showed no evidence of acute fracture or dislocation.  New RLL pulmonary nodule:   After the patient returned from visiting her family in [place], Dr. Marin Olp ordered a follow-up CT CAP on 08/06/22 to assess her response to Xeloda. This revealed a new solid pulmonary nodule of the right lower lobe measuring 6 mm, concerning for progressive metastatic disease. CT otherwise showed stability of the other previously seen pulmonary nodules, stable peritoneal mesenteric disease, and stability of the lobulated mass of the left adnexa.    For the new RLL nodule, Dr. Marin Olp recommends another course of radiation therapy which we will discuss in detail today. In terms of Xeloda, she seems to have some toxicity from treatment, and she will be decreased to 1500 mg p.o. twice daily per Dr. Marin Olp. She is otherwise tolerating and responding well to Xeloda.    ***  Allergies:  is allergic to nitroglycerin and ampicillin.  Meds: Current Outpatient Medications  Medication Sig Dispense Refill   alendronate (FOSAMAX) 70 MG tablet Take 70 mg by mouth every Sunday.     aspirin EC 81 MG tablet Take 81 mg by mouth daily. Swallow whole.     atorvastatin (LIPITOR) 40 MG tablet Take 1 tablet by mouth daily.     capecitabine (XELODA) 500 MG tablet Take 4 tablets (2,000 mg total) by mouth 2 (two) times daily after a meal. Take as instructed by MD. Derrill Memo  on 06/09/2022 224 tablet 1   CVS GENTLE LAXATIVE 5 MG EC tablet Take 5 mg by mouth daily as needed.     CVS PURELAX 17 GM/SCOOP powder Take 17 g by mouth daily as needed.     dexamethasone (DECADRON) 4 MG tablet Take 2 tablets (8 mg total) by mouth daily. Start the day after chemo for 2 days. 8 tablet 5   diclofenac Sodium (VOLTAREN) 1 % GEL Apply 4 g topically 4 (four) times daily. 100 g 0   feeding supplement,  ENSURE ENLIVE, (ENSURE ENLIVE) LIQD Take 237 mLs by mouth 3 (three) times daily between meals. 237 mL 12   glipiZIDE (GLUCOTROL) 5 MG tablet TAKE 1 TABLET BY MOUTH TWICE A DAY BEFORE MEALS 60 tablet 3   HYDROcodone-acetaminophen (NORCO/VICODIN) 5-325 MG tablet Take 1 tablet by mouth every 6 (six) hours as needed for moderate pain. 60 tablet 0   KLOR-CON M20 20 MEQ tablet TAKE 1 TABLET BY MOUTH TWICE A DAY 30 tablet 4   levothyroxine (SYNTHROID) 75 MCG tablet TAKE 1 TABLET BY MOUTH EVERY DAY 30 tablet 0   loperamide (IMODIUM) 2 MG capsule TAKE 2 CAPSULES (4 MG TOTAL) BY MOUTH AS NEEDED. TAKE 2 AT DIARRHEA ONSET , THEN 1 EVERY 2HR UNTIL 12HRS WITH NO BM. MAY TAKE 2 EVERY 4HRS AT NIGHT. IF DIARRHEA RECURS REPEAT. 90 capsule 2   metFORMIN (GLUCOPHAGE) 500 MG tablet Take 0.5 tablets (250 mg total) by mouth 2 (two) times daily with a meal. (Patient taking differently: Take 500 mg by mouth 2 (two) times daily with a meal.) 90 tablet 3   methocarbamol (ROBAXIN) 500 MG tablet TAKE 1 TABLET BY MOUTH EVERY 12 HOURS AS NEEDED FOR MUSCLE SPASMS. 60 tablet 2   metoprolol tartrate (LOPRESSOR) 25 MG tablet Take 1 tablet (25 mg total) by mouth 2 (two) times daily. 180 tablet 3   Multiple Vitamin (MULTIVITAMIN WITH MINERALS) TABS tablet Take 1 tablet by mouth daily. 30 tablet 0   ondansetron (ZOFRAN) 8 MG tablet TAKE 1 TABLET (8 MG TOTAL) BY MOUTH 2 (TWO) TIMES DAILY AS NEEDED FOR REFRACTORY NAUSEA / VOMITING. START ON DAY 3 AFTER CHEMOTHERAPY. 30 tablet 1   pantoprazole (PROTONIX) 20 MG tablet Take 1 tablet (20 mg total) by mouth 2 (two) times daily. 60 tablet 3   prednisoLONE acetate (PRED FORTE) 1 % ophthalmic suspension Place 1 drop into both eyes every Monday.     prochlorperazine (COMPAZINE) 10 MG tablet Take 1 tablet (10 mg total) by mouth every 6 (six) hours as needed (NAUSEA). 30 tablet 1   silver sulfADIAZINE (SILVADENE) 1 % cream Apply 1 application topically daily. 50 g 2   Vitamin D, Ergocalciferol,  (DRISDOL) 1.25 MG (50000 UNIT) CAPS capsule Take 50,000 Units by mouth once a week.     No current facility-administered medications for this encounter.   Facility-Administered Medications Ordered in Other Encounters  Medication Dose Route Frequency Provider Last Rate Last Admin   0.9 %  sodium chloride infusion   Intravenous Once Ennever, Rudell Cobb, MD       0.9 %  sodium chloride infusion   Intravenous Once Ennever, Rudell Cobb, MD       clindamycin (CLEOCIN) 900 mg in dextrose 5 % 50 mL IVPB  900 mg Intravenous 60 min Pre-Op Michael Boston, MD       And   gentamicin (GARAMYCIN) 5 mg/kg in dextrose 5 % 50 mL IVPB  5 mg/kg Intravenous 60 min Pre-Op Michael Boston,  MD       pegfilgrastim-cbqv [facility]) 6 MG/0.6ML injection             Physical Findings: The patient is in no acute distress. Patient is alert and oriented.  vitals were not taken for this visit. .  No significant changes. Lungs are clear to auscultation bilaterally. Heart has regular rate and rhythm. No palpable cervical, supraclavicular, or axillary adenopathy. Abdomen soft, non-tender, normal bowel sounds.   Lab Findings: Lab Results  Component Value Date   WBC 5.6 08/07/2022   HGB 11.5 (L) 08/07/2022   HCT 34.4 (L) 08/07/2022   MCV 101.5 (H) 08/07/2022   PLT 168 08/07/2022    Radiographic Findings: CT CHEST ABDOMEN PELVIS W CONTRAST  Result Date: 08/07/2022 CLINICAL DATA:  Colon cancer; * Tracking Code: BO * EXAM: CT CHEST, ABDOMEN, AND PELVIS WITH CONTRAST TECHNIQUE: Multidetector CT imaging of the chest, abdomen and pelvis was performed following the standard protocol during bolus administration of intravenous contrast. RADIATION DOSE REDUCTION: This exam was performed according to the departmental dose-optimization program which includes automated exposure control, adjustment of the mA and/or kV according to patient size and/or use of iterative reconstruction technique. CONTRAST:  18m OMNIPAQUE IOHEXOL 300 MG/ML  SOLN  COMPARISON:  CT chest, abdomen and pelvis dated April 05, 2022 FINDINGS: CT CHEST FINDINGS Cardiovascular: Normal heart size. No pericardial effusion. Normal caliber thoracic aorta with moderate calcified plaque. Mild coronary artery calcifications. Suspicious filling defects of the central pulmonary arteries. Mediastinum/Nodes: Small hiatal hernia. Stable calcified left thyroid nodule. No pathologically enlarged nodes seen in the chest Lungs/Pleura: Central airways are patent. Bilateral solid pulmonary nodules. New solid pulmonary nodule of the right lower lobe measuring 6 mm on series 4, image 100 other previously seen pulmonary nodules are stable. Reference nodule of the superior left lower lobe measuring 16 x 10 mm on series 4, image 67, unchanged. Musculoskeletal: No chest wall mass or suspicious bone lesions identified. CT ABDOMEN PELVIS FINDINGS Hepatobiliary: Peripheral arterially enhancing lesion of the right lobe of the liver seen on series 2, image 71, likely perfusional. Linear area of hypoattenuation near the falciform ligament, likely focal fatty infiltration. No suspicious liver lesions. Gallbladder is unremarkable. No biliary ductal dilation. Pancreas: Unremarkable. No pancreatic ductal dilatation or surrounding inflammatory changes. Spleen: Normal in size without focal abnormality. Adrenals/Urinary Tract: Adrenal glands are unremarkable. Kidneys are normal, without renal calculi, focal lesion, or hydronephrosis. Bladder is unremarkable. Stomach/Bowel: Normal appearance of the stomach. Prior sigmoidectomy with left lower quadrant colostomy. Large parastomal hernia containing nondilated loops of small bowel. No bowel wall thickening or evidence of obstruction. Vascular/Lymphatic: Aortic atherosclerosis. No enlarged abdominal or pelvic lymph nodes. Reproductive: Lobulated mass of the left adnexa measuring 5.7 x 3.2 cm, unchanged. Other: Peritoneal nodules and mesenteric thickening, unchanged when  compared with the prior. Reference right lower quadrant peritoneal nodule measuring 0.9 x 1.6 cm on series 2, image 79, unchanged. Musculoskeletal: No acute or significant osseous findings. IMPRESSION: 1. New solid pulmonary nodule of the right lower lobe measuring 6 mm, concerning for progressive metastatic disease. Other previously seen pulmonary nodules are stable. 2. Stable peritoneal mesenteric disease. 3. Stable lobulated mass of the left adnexa. Electronically Signed   By: LYetta GlassmanM.D.   On: 08/07/2022 10:41    Impression:  New right lower lobe pulmonary nodule   The primary encounter diagnosis was 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 ([place]. A diagnosis of Lung nodule was also pertinent to this visit.  Stage IIIB ([id number]) metastatic adenocarcinoma of the sigmoid colon; adnexal mass, and metastatic disease to the lung and an intra-abdominal lymph node - s/p sigmoid colectomy and chemotherapy  The patient is recovering from the effects of radiation.  ***  Plan:  ***   *** minutes of total time was spent for this patient encounter, including preparation, face-to-face counseling with the patient and coordination of care, physical exam, and documentation of the encounter. ____________________________________  Blair Promise, PhD, MD  This document serves as a record of services personally performed by Gery Pray, MD. It was created on his behalf by Roney Mans, a trained medical scribe. The creation of this record is based on the scribe's personal observations and the provider's statements to them. This document has been checked and approved by the attending provider.

## 2022-08-21 ENCOUNTER — Ambulatory Visit
Admission: RE | Admit: 2022-08-21 | Discharge: 2022-08-21 | Disposition: A | Discharge status: Home / Self Care | Payer: Medicaid Other | Source: Ambulatory Visit | Attending: Radiation Oncology | Admitting: Radiation Oncology

## 2022-08-21 ENCOUNTER — Encounter: Payer: Self-pay | Admitting: Radiation Oncology

## 2022-08-21 ENCOUNTER — Other Ambulatory Visit: Payer: Self-pay

## 2022-08-21 VITALS — BP 143/66 | HR 80 | Temp 97.8°F | Resp 20 | Ht 59.0 in | Wt 159.2 lb

## 2022-08-21 DIAGNOSIS — Z79899 Other long term (current) drug therapy: Secondary | ICD-10-CM | POA: Diagnosis not present

## 2022-08-21 DIAGNOSIS — K449 Diaphragmatic hernia without obstruction or gangrene: Secondary | ICD-10-CM | POA: Insufficient documentation

## 2022-08-21 DIAGNOSIS — Z9221 Personal history of antineoplastic chemotherapy: Secondary | ICD-10-CM | POA: Diagnosis not present

## 2022-08-21 DIAGNOSIS — C187 Malignant neoplasm of sigmoid colon: Secondary | ICD-10-CM | POA: Diagnosis not present

## 2022-08-21 DIAGNOSIS — R911 Solitary pulmonary nodule: Secondary | ICD-10-CM

## 2022-08-21 DIAGNOSIS — Z7982 Long term (current) use of aspirin: Secondary | ICD-10-CM | POA: Insufficient documentation

## 2022-08-21 DIAGNOSIS — Z7984 Long term (current) use of oral hypoglycemic drugs: Secondary | ICD-10-CM | POA: Diagnosis not present

## 2022-08-21 DIAGNOSIS — Z7952 Long term (current) use of systemic steroids: Secondary | ICD-10-CM | POA: Insufficient documentation

## 2022-08-21 DIAGNOSIS — C772 Secondary and unspecified malignant neoplasm of intra-abdominal lymph nodes: Secondary | ICD-10-CM | POA: Diagnosis not present

## 2022-08-21 DIAGNOSIS — Z791 Long term (current) use of non-steroidal anti-inflammatories (NSAID): Secondary | ICD-10-CM | POA: Diagnosis not present

## 2022-08-21 DIAGNOSIS — Z79890 Hormone replacement therapy (postmenopausal): Secondary | ICD-10-CM | POA: Insufficient documentation

## 2022-08-21 DIAGNOSIS — Z923 Personal history of irradiation: Secondary | ICD-10-CM | POA: Diagnosis not present

## 2022-08-21 NOTE — Progress Notes (Signed)
  Location of tumor and Histology per Pathology Report:   Biopsy: NA  Past/Anticipated interventions by surgeon, if any: None at this time.   Past/Anticipated interventions by medical oncology, if any:     Pain issues, if any:  yes, reports having a headache,rating 5/10. Cough- Yes, productive Shortness of breath- yes, mostly when laying flat.   SAFETY ISSUES: Prior radiation? Yes  Pacemaker/ICD? no Possible current pregnancy? no Is the patient on methotrexate? no  Current Complaints / other details:  Reports skin issues to legs and fingertips, since starting Xeloda.      BP (!) 143/66 (BP Location: Right Arm, Patient Position: Sitting, Cuff Size: Large)   Pulse 80   Temp 97.8 F (36.6 C)   Resp 20   Ht '4\' 11"'$  (1.499 m)   Wt 159 lb 3.2 oz (72.2 kg)   SpO2 99%   BMI 32.15 kg/m

## 2022-08-27 ENCOUNTER — Ambulatory Visit
Admission: RE | Admit: 2022-08-27 | Discharge: 2022-08-27 | Disposition: A | Discharge status: Home / Self Care | Payer: Medicaid Other | Source: Ambulatory Visit | Attending: Radiation Oncology | Admitting: Radiation Oncology

## 2022-08-27 DIAGNOSIS — R911 Solitary pulmonary nodule: Secondary | ICD-10-CM | POA: Insufficient documentation

## 2022-08-27 DIAGNOSIS — C187 Malignant neoplasm of sigmoid colon: Secondary | ICD-10-CM | POA: Diagnosis not present

## 2022-08-27 DIAGNOSIS — C772 Secondary and unspecified malignant neoplasm of intra-abdominal lymph nodes: Secondary | ICD-10-CM | POA: Diagnosis not present

## 2022-08-27 DIAGNOSIS — Z510 Encounter for antineoplastic radiation therapy: Secondary | ICD-10-CM | POA: Insufficient documentation

## 2022-08-28 DIAGNOSIS — Z510 Encounter for antineoplastic radiation therapy: Secondary | ICD-10-CM | POA: Diagnosis not present

## 2022-09-02 ENCOUNTER — Other Ambulatory Visit (HOSPITAL_COMMUNITY): Payer: Self-pay

## 2022-09-02 ENCOUNTER — Other Ambulatory Visit: Payer: Self-pay | Admitting: Hematology & Oncology

## 2022-09-02 DIAGNOSIS — C772 Secondary and unspecified malignant neoplasm of intra-abdominal lymph nodes: Secondary | ICD-10-CM

## 2022-09-02 MED ORDER — CAPECITABINE 500 MG PO TABS
2000.0000 mg | ORAL_TABLET | Freq: Two times a day (BID) | ORAL | 1 refills | Status: DC
  Filled 2022-09-02 – 2022-09-03 (×2): qty 224, 28d supply, fill #0

## 2022-09-03 ENCOUNTER — Other Ambulatory Visit: Payer: Self-pay

## 2022-09-03 ENCOUNTER — Encounter: Payer: Self-pay | Admitting: Hematology & Oncology

## 2022-09-03 ENCOUNTER — Inpatient Hospital Stay: Payer: Medicaid Other | Attending: Radiation Oncology

## 2022-09-03 ENCOUNTER — Inpatient Hospital Stay (HOSPITAL_BASED_OUTPATIENT_CLINIC_OR_DEPARTMENT_OTHER): Payer: Medicaid Other | Admitting: Hematology & Oncology

## 2022-09-03 ENCOUNTER — Inpatient Hospital Stay: Payer: Medicaid Other

## 2022-09-03 ENCOUNTER — Other Ambulatory Visit (HOSPITAL_COMMUNITY): Payer: Self-pay

## 2022-09-03 VITALS — BP 116/64 | HR 87 | Temp 98.4°F | Resp 18 | Ht 59.0 in | Wt 159.0 lb

## 2022-09-03 DIAGNOSIS — C772 Secondary and unspecified malignant neoplasm of intra-abdominal lymph nodes: Secondary | ICD-10-CM

## 2022-09-03 DIAGNOSIS — Z95828 Presence of other vascular implants and grafts: Secondary | ICD-10-CM

## 2022-09-03 DIAGNOSIS — D510 Vitamin B12 deficiency anemia due to intrinsic factor deficiency: Secondary | ICD-10-CM | POA: Diagnosis not present

## 2022-09-03 DIAGNOSIS — K1379 Other lesions of oral mucosa: Secondary | ICD-10-CM | POA: Insufficient documentation

## 2022-09-03 DIAGNOSIS — R911 Solitary pulmonary nodule: Secondary | ICD-10-CM | POA: Diagnosis not present

## 2022-09-03 DIAGNOSIS — R197 Diarrhea, unspecified: Secondary | ICD-10-CM | POA: Insufficient documentation

## 2022-09-03 DIAGNOSIS — C187 Malignant neoplasm of sigmoid colon: Secondary | ICD-10-CM | POA: Diagnosis present

## 2022-09-03 LAB — CBC WITH DIFFERENTIAL (CANCER CENTER ONLY)
Abs Immature Granulocytes: 0.03 10*3/uL (ref 0.00–0.07)
Basophils Absolute: 0 10*3/uL (ref 0.0–0.1)
Basophils Relative: 0 %
Eosinophils Absolute: 0.2 10*3/uL (ref 0.0–0.5)
Eosinophils Relative: 3 %
HCT: 32.4 % — ABNORMAL LOW (ref 36.0–46.0)
Hemoglobin: 11 g/dL — ABNORMAL LOW (ref 12.0–15.0)
Immature Granulocytes: 0 %
Lymphocytes Relative: 24 %
Lymphs Abs: 1.8 10*3/uL (ref 0.7–4.0)
MCH: 34.4 pg — ABNORMAL HIGH (ref 26.0–34.0)
MCHC: 34 g/dL (ref 30.0–36.0)
MCV: 101.3 fL — ABNORMAL HIGH (ref 80.0–100.0)
Monocytes Absolute: 0.6 10*3/uL (ref 0.1–1.0)
Monocytes Relative: 8 %
Neutro Abs: 4.6 10*3/uL (ref 1.7–7.7)
Neutrophils Relative %: 65 %
Platelet Count: 187 10*3/uL (ref 150–400)
RBC: 3.2 MIL/uL — ABNORMAL LOW (ref 3.87–5.11)
RDW: 15.5 % (ref 11.5–15.5)
WBC Count: 7.2 10*3/uL (ref 4.0–10.5)
nRBC: 0 % (ref 0.0–0.2)

## 2022-09-03 LAB — CMP (CANCER CENTER ONLY)
ALT: 21 U/L (ref 0–44)
AST: 24 U/L (ref 15–41)
Albumin: 4.4 g/dL (ref 3.5–5.0)
Alkaline Phosphatase: 36 U/L — ABNORMAL LOW (ref 38–126)
Anion gap: 10 (ref 5–15)
BUN: 15 mg/dL (ref 8–23)
CO2: 25 mmol/L (ref 22–32)
Calcium: 8.9 mg/dL (ref 8.9–10.3)
Chloride: 103 mmol/L (ref 98–111)
Creatinine: 0.56 mg/dL (ref 0.44–1.00)
GFR, Estimated: >60 mL/min (ref >60)
Glucose, Bld: 136 mg/dL — ABNORMAL HIGH (ref 70–99)
Potassium: 3.4 mmol/L — ABNORMAL LOW (ref 3.5–5.1)
Sodium: 138 mmol/L (ref 135–145)
Total Bilirubin: 0.6 mg/dL (ref 0.3–1.2)
Total Protein: 7.3 g/dL (ref 6.5–8.1)

## 2022-09-03 LAB — CEA (IN HOUSE-CHCC): CEA (CHCC-In House): 5.64 ng/mL — ABNORMAL HIGH (ref 0.00–5.00)

## 2022-09-03 MED ORDER — LIDOCAINE-PRILOCAINE 2.5-2.5 % EX CREA: 1.0000 | TOPICAL_CREAM | CUTANEOUS | 3 refills | Status: AC | PRN

## 2022-09-03 MED ORDER — SODIUM CHLORIDE 0.9% FLUSH: 10.0000 mL | INTRAVENOUS | Status: DC | PRN

## 2022-09-03 MED ORDER — HEPARIN SOD (PORK) LOCK FLUSH 100 UNIT/ML IV SOLN
500.0000 [IU] | Freq: Once | INTRAVENOUS | Status: AC
  Administered 2022-09-03: 500 [IU] via INTRAVENOUS

## 2022-09-03 MED ORDER — HEPARIN SOD (PORK) LOCK FLUSH 100 UNIT/ML IV SOLN: 500.0000 [IU] | Freq: Once | INTRAVENOUS | Status: DC

## 2022-09-03 MED ORDER — SODIUM CHLORIDE 0.9% FLUSH
10.0000 mL | INTRAVENOUS | Status: DC | PRN
  Administered 2022-09-03: 10 mL via INTRAVENOUS

## 2022-09-03 NOTE — Progress Notes (Signed)
Hematology and Oncology Follow Up Visit  Bailey Walker [id number] 1945/10/25 76 y.o. 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 twice daily (14/7) -start in 05/23/2022   Interim History:  Bailey Walker is here today for follow-up.  Amazingly, for some reason, she has been taking the Xeloda daily.  I am unsure why she has started doing this.  When she was overseas in [place], she took it every 2 weeks on and 1 week off.  Because of the daily intake, she has a lot of erythema on the palms of her hand.  She is using the Voltaren cream which is not helping her.  She also is having diarrhea.  She is having some mouth sores.  I told her niece that she needs to have Bailey Walker stop the Xeloda until the 27th.  I told her to use water and baking soda rinse her mouth out so that mouth sores would not develop.  The Xeloda has worked incredibly well.  Her last CEA level is down to 4.5.  She has had some pain in the abdomen.  This is where her ostomy is.  She has always had this.  She is still planning on moving to [place].  She is going to do this in January from what the niece says.  She has not yet started radiosurgery.  I think she starts on the 19th for 3 doses for a nodule in the right lower lung..  She did have the CT scan done on 08/06/2022.  This really did not show any evidence of active disease outside of the solitary nodule in the right lower lung.  Her appetite has been good.  Her family had a very nice Thanksgiving.  Overall, I would have to say that her performance status is  probably ECOG 1.   Medications:  Allergies as of 09/03/2022       Reactions   Nitroglycerin Nausea And Vomiting   Ampicillin Other (See Comments)   Per allergy test        Medication List        Accurate as of September 03, 2022  3:55 PM. If you have any questions, ask your nurse or doctor.          alendronate 70 MG tablet Commonly known as: FOSAMAX Take 70 mg by mouth every Sunday.   aspirin EC 81 MG tablet Take 81 mg by mouth daily. Swallow whole.   atorvastatin 40 MG tablet Commonly known as: LIPITOR Take 1 tablet by mouth daily.   capecitabine 500 MG tablet Commonly known as: Xeloda Take 4 tablets (2,000 mg total) by mouth 2 (two) times daily after a meal. Take as instructed by MD. Start on 06/09/2022   CVS Gentle Laxative 5 MG EC tablet Generic drug: bisacodyl Take 5 mg by mouth daily as needed.   CVS Purelax 17 GM/SCOOP powder Generic drug: polyethylene glycol powder Take 17 g by mouth daily as needed.   dexamethasone 4 MG tablet Commonly known as: DECADRON Take 2 tablets (8 mg total) by mouth daily.  Start the day after chemo for 2 days.   diclofenac Sodium 1 % Gel Commonly known as: VOLTAREN Apply 4 g topically 4 (four) times daily.   feeding supplement Liqd Take 237 mLs by mouth 3 (three) times daily between meals.   glipiZIDE 5 MG tablet Commonly known as: GLUCOTROL TAKE 1 TABLET BY MOUTH TWICE A DAY BEFORE MEALS   HYDROcodone-acetaminophen 5-325 MG tablet Commonly known as: NORCO/VICODIN Take 1 tablet by mouth every 6 (six) hours as needed for moderate pain.   Klor-Con M20 20 MEQ tablet Generic drug: potassium chloride SA TAKE 1 TABLET BY MOUTH TWICE A DAY   levothyroxine 75 MCG tablet Commonly known as: SYNTHROID TAKE 1 TABLET BY MOUTH EVERY DAY   lidocaine-prilocaine cream Commonly known as: EMLA Apply 1 Application topically as needed. Started by: Volanda Napoleon, MD   loperamide 2 MG capsule Commonly known as: IMODIUM TAKE  2 CAPSULES (4 MG TOTAL) BY MOUTH AS NEEDED. TAKE 2 AT DIARRHEA ONSET , THEN 1 EVERY 2HR UNTIL 12HRS WITH NO BM. MAY TAKE 2 EVERY 4HRS AT NIGHT. IF DIARRHEA RECURS REPEAT.   metFORMIN 500 MG tablet Commonly known as: GLUCOPHAGE Take 0.5 tablets (250 mg total) by mouth 2 (two) times daily with a meal. What changed: how much to take   methocarbamol 500 MG tablet Commonly known as: ROBAXIN TAKE 1 TABLET BY MOUTH EVERY 12 HOURS AS NEEDED FOR MUSCLE SPASMS.   metoprolol tartrate 25 MG tablet Commonly known as: LOPRESSOR Take 1 tablet (25 mg total) by mouth 2 (two) times daily.   multivitamin with minerals Tabs tablet Take 1 tablet by mouth daily.   ondansetron 8 MG tablet Commonly known as: ZOFRAN TAKE 1 TABLET (8 MG TOTAL) BY MOUTH 2 (TWO) TIMES DAILY AS NEEDED FOR REFRACTORY NAUSEA / VOMITING. START ON DAY 3 AFTER CHEMOTHERAPY.   pantoprazole 20 MG tablet Commonly known as: PROTONIX Take 1 tablet (20 mg total) by mouth 2 (two) times daily.   prednisoLONE acetate 1 % ophthalmic suspension Commonly known as: PRED FORTE Place 1 drop into both eyes every Monday.   prochlorperazine 10 MG tablet Commonly known as: COMPAZINE Take 1 tablet (10 mg total) by mouth every 6 (six) hours as needed (NAUSEA).   silver sulfADIAZINE 1 % cream Commonly known as: Silvadene Apply 1 application topically daily.   Vitamin D (Ergocalciferol) 1.25 MG (50000 UNIT) Caps capsule Commonly known as: DRISDOL Take 50,000 Units by mouth once a week.        Allergies:  Allergies  Allergen Reactions   Nitroglycerin Nausea And Vomiting   Ampicillin Other (See Comments)    Per allergy test     Past Medical History, Surgical history, Social history, and Family History were reviewed and updated.  Review of Systems: Review of Systems  HENT: Negative.    Eyes: Negative.   Respiratory:  Positive for shortness of breath.   Cardiovascular: Negative.   Gastrointestinal:  Positive for abdominal pain.   Genitourinary: Negative.   Musculoskeletal:  Positive for back pain.  Skin: Negative.   Neurological: Negative.   Endo/Heme/Allergies: Negative.   Psychiatric/Behavioral: Negative.       Physical Exam:  height is 4' 11" (1.499 m) and weight is 159 lb (72.1 kg). Her oral temperature is 98.4 F (36.9 C). Her blood pressure is 116/64 and her pulse is 87. Her respiration is 18 and oxygen saturation is 98%.   Wt Readings from Last 3 Encounters:  09/03/22 159 lb (72.1 kg)  08/21/22  159 lb 3.2 oz (72.2 kg)  08/07/22 159 lb (72.1 kg)    Physical Exam Vitals reviewed.  HENT:     Head: Normocephalic and atraumatic.  Eyes:     Pupils: Pupils are equal, round, and reactive to light.  Cardiovascular:     Rate and Rhythm: Normal rate and regular rhythm.     Heart sounds: Normal heart sounds.  Pulmonary:     Effort: Pulmonary effort is normal.     Breath sounds: Normal breath sounds.  Abdominal:     General: Bowel sounds are normal.     Palpations: Abdomen is soft.     Comments: Her abdomen is slightly distended.  There is no obvious fluid wave.  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  She still has a dressing over the abdominal laparotomy scar.  She does not have any obvious erythema or warmth.  There is no exudate coming from the scars.  She has decent bowel sounds.  There is no palpable liver or spleen tip.  Musculoskeletal:        General: No tenderness or deformity. Normal range of motion.     Cervical back: Normal range of motion.  Lymphadenopathy:     Cervical: No cervical adenopathy.  Skin:    General: Skin is warm and dry.     Findings: No erythema or rash.  Neurological:     Mental Status: She is alert and oriented to person, place, and time.  Psychiatric:        Behavior: Behavior normal.        Thought Content: Thought content normal.        Judgment: Judgment normal.      Lab Results  Component Value Date   WBC 7.2 09/03/2022   HGB 11.0 (L) 09/03/2022    HCT 32.4 (L) 09/03/2022   MCV 101.3 (H) 09/03/2022   PLT 187 09/03/2022   Lab Results  Component Value Date   FERRITIN 240 08/07/2022   IRON 73 08/07/2022   TIBC 329 08/07/2022   UIBC 256 08/07/2022   IRONPCTSAT 22 08/07/2022   Lab Results  Component Value Date   RETICCTPCT 2.6 08/07/2022   RBC 3.20 (L) 09/03/2022   No results found for: "KPAFRELGTCHN", "LAMBDASER", "KAPLAMBRATIO" No results found for: "IGGSERUM", "IGA", "IGMSERUM" No results found for: "TOTALPROTELP", "ALBUMINELP", "A1GS", "A2GS", "BETS", "BETA2SER", "GAMS", "MSPIKE", "SPEI"   Chemistry      Component Value Date/Time   NA 139 08/07/2022 0921   NA 139 07/12/2019 1110   K 3.7 08/07/2022 0921   CL 101 08/07/2022 0921   CO2 28 08/07/2022 0921   BUN 13 08/07/2022 0921   BUN 15 07/12/2019 1110   CREATININE 0.53 08/07/2022 0921   CREATININE 0.74 01/08/2017 1100      Component Value Date/Time   CALCIUM 9.5 08/07/2022 0921   ALKPHOS 39 08/07/2022 0921   AST 21 08/07/2022 0921   ALT 20 08/07/2022 0921   BILITOT 0.7 08/07/2022 0921       Impression and Plan: Bailey Walker is a very pleasant 76 yo Kurdish female with stage IIIB ([id number]) 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   She has recurrent disease.  We are treating with FOLFIRI.  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   I would think that the CEA level should be a good indicator that she has responded.  For right now, she will have the radiosurgery for the right  lower lung nodule.  Again she will hold the Xeloda until after Christmas.  I would like to see her back in January.  Will see her back before she is over to [place] for good.  I think she has a family member who is a doctor over there that she had seen when she was over there in the Fall of this year.  Again she really has done well on the Xeloda.  We have not used Avastin because of the poor healing from the abdominal wound.  Hopefully  the Emla cream may help a little bit.   Volanda Napoleon, MD 12/13/20233:55 PM

## 2022-09-04 ENCOUNTER — Other Ambulatory Visit: Payer: Self-pay

## 2022-09-04 ENCOUNTER — Other Ambulatory Visit: Payer: Medicaid Other

## 2022-09-04 ENCOUNTER — Ambulatory Visit: Payer: Medicaid Other | Admitting: Hematology & Oncology

## 2022-09-05 ENCOUNTER — Emergency Department (HOSPITAL_COMMUNITY): Payer: Medicaid Other

## 2022-09-05 ENCOUNTER — Other Ambulatory Visit: Payer: Self-pay

## 2022-09-05 ENCOUNTER — Inpatient Hospital Stay (HOSPITAL_COMMUNITY)
Admission: EM | Admit: 2022-09-05 | Discharge: 2022-09-09 | DRG: 392 | Disposition: A | Discharge status: Home / Self Care | Payer: Medicaid Other | Attending: Internal Medicine | Admitting: Internal Medicine

## 2022-09-05 ENCOUNTER — Telehealth: Payer: Self-pay | Admitting: *Deleted

## 2022-09-05 DIAGNOSIS — K9400 Colostomy complication, unspecified: Secondary | ICD-10-CM

## 2022-09-05 DIAGNOSIS — R112 Nausea with vomiting, unspecified: Secondary | ICD-10-CM

## 2022-09-05 DIAGNOSIS — E1165 Type 2 diabetes mellitus with hyperglycemia: Secondary | ICD-10-CM | POA: Diagnosis present

## 2022-09-05 DIAGNOSIS — E039 Hypothyroidism, unspecified: Secondary | ICD-10-CM | POA: Diagnosis present

## 2022-09-05 DIAGNOSIS — Z7982 Long term (current) use of aspirin: Secondary | ICD-10-CM

## 2022-09-05 DIAGNOSIS — Z933 Colostomy status: Secondary | ICD-10-CM

## 2022-09-05 DIAGNOSIS — Z6830 Body mass index (BMI) 30.0-30.9, adult: Secondary | ICD-10-CM

## 2022-09-05 DIAGNOSIS — E785 Hyperlipidemia, unspecified: Secondary | ICD-10-CM | POA: Diagnosis present

## 2022-09-05 DIAGNOSIS — R040 Epistaxis: Secondary | ICD-10-CM | POA: Diagnosis present

## 2022-09-05 DIAGNOSIS — Z803 Family history of malignant neoplasm of breast: Secondary | ICD-10-CM

## 2022-09-05 DIAGNOSIS — C7800 Secondary malignant neoplasm of unspecified lung: Secondary | ICD-10-CM | POA: Diagnosis present

## 2022-09-05 DIAGNOSIS — Z8249 Family history of ischemic heart disease and other diseases of the circulatory system: Secondary | ICD-10-CM

## 2022-09-05 DIAGNOSIS — A084 Viral intestinal infection, unspecified: Principal | ICD-10-CM | POA: Diagnosis present

## 2022-09-05 DIAGNOSIS — K591 Functional diarrhea: Secondary | ICD-10-CM

## 2022-09-05 DIAGNOSIS — C772 Secondary and unspecified malignant neoplasm of intra-abdominal lymph nodes: Secondary | ICD-10-CM | POA: Diagnosis present

## 2022-09-05 DIAGNOSIS — E669 Obesity, unspecified: Secondary | ICD-10-CM | POA: Diagnosis present

## 2022-09-05 DIAGNOSIS — Z79890 Hormone replacement therapy (postmenopausal): Secondary | ICD-10-CM

## 2022-09-05 DIAGNOSIS — E860 Dehydration: Principal | ICD-10-CM

## 2022-09-05 DIAGNOSIS — Z1152 Encounter for screening for COVID-19: Secondary | ICD-10-CM

## 2022-09-05 DIAGNOSIS — Z7984 Long term (current) use of oral hypoglycemic drugs: Secondary | ICD-10-CM

## 2022-09-05 DIAGNOSIS — Z888 Allergy status to other drugs, medicaments and biological substances status: Secondary | ICD-10-CM

## 2022-09-05 DIAGNOSIS — E876 Hypokalemia: Secondary | ICD-10-CM | POA: Diagnosis present

## 2022-09-05 DIAGNOSIS — Z87891 Personal history of nicotine dependence: Secondary | ICD-10-CM

## 2022-09-05 DIAGNOSIS — I10 Essential (primary) hypertension: Secondary | ICD-10-CM | POA: Diagnosis present

## 2022-09-05 DIAGNOSIS — K219 Gastro-esophageal reflux disease without esophagitis: Secondary | ICD-10-CM | POA: Diagnosis present

## 2022-09-05 DIAGNOSIS — Z95828 Presence of other vascular implants and grafts: Secondary | ICD-10-CM

## 2022-09-05 DIAGNOSIS — Z923 Personal history of irradiation: Secondary | ICD-10-CM

## 2022-09-05 DIAGNOSIS — Z85038 Personal history of other malignant neoplasm of large intestine: Secondary | ICD-10-CM

## 2022-09-05 DIAGNOSIS — R197 Diarrhea, unspecified: Secondary | ICD-10-CM | POA: Diagnosis present

## 2022-09-05 LAB — COMPREHENSIVE METABOLIC PANEL
ALT: 21 U/L (ref 0–44)
AST: 22 U/L (ref 15–41)
Albumin: 4 g/dL (ref 3.5–5.0)
Alkaline Phosphatase: 32 U/L — ABNORMAL LOW (ref 38–126)
Anion gap: 10 (ref 5–15)
BUN: 16 mg/dL (ref 8–23)
CO2: 19 mmol/L — ABNORMAL LOW (ref 22–32)
Calcium: 8.7 mg/dL — ABNORMAL LOW (ref 8.9–10.3)
Chloride: 108 mmol/L (ref 98–111)
Creatinine, Ser: 0.67 mg/dL (ref 0.44–1.00)
GFR, Estimated: >60 mL/min (ref >60)
Glucose, Bld: 163 mg/dL — ABNORMAL HIGH (ref 70–99)
Potassium: 3 mmol/L — ABNORMAL LOW (ref 3.5–5.1)
Sodium: 137 mmol/L (ref 135–145)
Total Bilirubin: 1.2 mg/dL (ref 0.3–1.2)
Total Protein: 6.9 g/dL (ref 6.5–8.1)

## 2022-09-05 LAB — CBC WITH DIFFERENTIAL/PLATELET
Abs Immature Granulocytes: 0.02 10*3/uL (ref 0.00–0.07)
Basophils Absolute: 0 10*3/uL (ref 0.0–0.1)
Basophils Relative: 0 %
Eosinophils Absolute: 0 10*3/uL (ref 0.0–0.5)
Eosinophils Relative: 1 %
HCT: 36.1 % (ref 36.0–46.0)
Hemoglobin: 12.1 g/dL (ref 12.0–15.0)
Immature Granulocytes: 0 %
Lymphocytes Relative: 19 %
Lymphs Abs: 1 10*3/uL (ref 0.7–4.0)
MCH: 34.2 pg — ABNORMAL HIGH (ref 26.0–34.0)
MCHC: 33.5 g/dL (ref 30.0–36.0)
MCV: 102 fL — ABNORMAL HIGH (ref 80.0–100.0)
Monocytes Absolute: 1.5 10*3/uL — ABNORMAL HIGH (ref 0.1–1.0)
Monocytes Relative: 28 %
Neutro Abs: 2.8 10*3/uL (ref 1.7–7.7)
Neutrophils Relative %: 52 %
Platelets: 210 10*3/uL (ref 150–400)
RBC: 3.54 MIL/uL — ABNORMAL LOW (ref 3.87–5.11)
RDW: 16.1 % — ABNORMAL HIGH (ref 11.5–15.5)
WBC: 5.3 10*3/uL (ref 4.0–10.5)
nRBC: 0 % (ref 0.0–0.2)

## 2022-09-05 LAB — LACTIC ACID, PLASMA: Lactic Acid, Venous: 2 mmol/L (ref 0.5–1.9)

## 2022-09-05 LAB — LIPASE, BLOOD: Lipase: 23 U/L (ref 11–51)

## 2022-09-05 MED ORDER — METOCLOPRAMIDE HCL 5 MG/ML IJ SOLN
5.0000 mg | Freq: Once | INTRAMUSCULAR | Status: AC
  Administered 2022-09-05: 5 mg via INTRAVENOUS
  Filled 2022-09-05: qty 2

## 2022-09-05 MED ORDER — DIPHENHYDRAMINE HCL 50 MG/ML IJ SOLN
12.5000 mg | Freq: Once | INTRAMUSCULAR | Status: AC
  Administered 2022-09-05: 12.5 mg via INTRAVENOUS
  Filled 2022-09-05: qty 1

## 2022-09-05 MED ORDER — LACTATED RINGERS IV SOLN: INTRAVENOUS | Status: DC

## 2022-09-05 MED ORDER — LACTATED RINGERS IV BOLUS
1000.0000 mL | Freq: Once | INTRAVENOUS | Status: AC
  Administered 2022-09-05: 1000 mL via INTRAVENOUS

## 2022-09-05 NOTE — ED Triage Notes (Signed)
Patient coming to ED for evaluation of lower abdominal pain x 1 week.  Vomiting and diarrhea "started today."  Hx of colon CA.  Currently taking chemo orally.  Last dose was two days ago.  No reports of fever.

## 2022-09-05 NOTE — ED Notes (Signed)
Pt. Refused in and out cath

## 2022-09-05 NOTE — ED Provider Notes (Signed)
Received patient in turnover from Dr. Freida Busman.  Please see their note for further details of Hx, PE.  Briefly patient is a 76 y.o. female with a Abdominal Pain .  Patient having quite a bit of diarrhea.  Found to have a metabolic acidosis without anion gap.  Thought likely secondary to diarrhea.  Also found to be quite tachycardic here.  Given IV fluids CT imaging without obvious concern.  Plan for more fluids reassess and if persistent tachycardia or worsening symptoms discussed with hospitalist for admission.  On repeat assessment patient's heart rate is still in the 120s.  And C. difficile negative.  COVID and flu negative.  With a history of nausea vomiting and diarrhea suspect likely profoundly dehydrated.  Will discuss with hospitalist for admission.    Melene Plan, DO 09/06/22 [phone]

## 2022-09-05 NOTE — Telephone Encounter (Signed)
Message received from patient's niece, [name] stating that pt has new abd pain that started yesterday, diarrhea for several days and has vomited times two today.  Dr. Marin Olp notified.  Call placed back to [place] notified per order of Dr. Marin Olp to take [facility] to the [facility] ER now.  [name] states that she will take pt to the [facility] ER now and is appreciative of call back.

## 2022-09-05 NOTE — ED Provider Triage Note (Signed)
Emergency Medicine Provider Triage Evaluation Note  Bailey Walker , a 76 y.o. female  was evaluated in triage.  Pt complains of severe abdominal pain.  History of colon cancer s/p colostomy.  Has noted persistent yellow watery stool over the last few days.  Unable to keep down any p.o. intake today.  No blood in emesis.  Abdominal pain is diffuse in nature.  Having to change colostomy bag multiple times throughout the day.  Apparently had mixup with chemo pills and was taking more frequently than normal.  She denies any recent antibiotics.  Did have recent travel  Review of Systems  Positive: Abd pain, emesis, diarrhea Negative: fever  Physical Exam  BP 121/72 (BP Location: Left Arm)   Pulse (!) 125   Temp 98.1 F (36.7 C) (Oral)   Resp 16   Ht 5' (1.524 m)   Wt 72.1 kg   SpO2 98%   BMI 31.05 kg/m  Gen:   Awake, no distress   Resp:  Normal effort  MSK:   Moves extremities without difficulty  ABD:  Diffuse tenderness with voluntary guarding.  Colostomy bag to left lower abdomen with watery bright yellow stool. Other:    Medical Decision Making  Medically screening exam initiated at 7:57 PM.  Appropriate orders placed.  Tobey Koraeel Mcclune was informed that the remainder of the evaluation will be completed by another provider, this initial triage assessment does not replace that evaluation, and the importance of remaining in the ED until their evaluation is complete.  Diarrhea, abd pain   Theresa Wedel A, PA-C 09/05/22 1959

## 2022-09-05 NOTE — ED Provider Notes (Signed)
[place] [facility] DEPT Provider Note   CSN: [id number] Arrival date & time: 09/05/22  1834     History  Chief Complaint  Patient presents with   Abdominal Pain    Bailey Walker is a 76 y.o. female.  76 year old female presents with 24 hours of lower abdominal pain with associated emesis which began today.  Has also been having diarrhea for quite some time.  Has history of colon cancer and has a colostomy and the daughter was at bedside has noted increased output.  No fever or chills.  Emesis described as nonbilious or bloody.  Unresponsive to home medications.  Has had some urinary symptoms of dysuria.       Home Medications Prior to Admission medications   Medication Sig Start Date End Date Taking? Authorizing Provider  alendronate (FOSAMAX) 70 MG tablet Take 70 mg by mouth every Sunday. 02/22/21   [provider]  aspirin EC 81 MG tablet Take 81 mg by mouth daily. Swallow whole.    [provider]  atorvastatin (LIPITOR) 40 MG tablet Take 1 tablet by mouth daily. 02/09/22   [provider]  capecitabine (XELODA) 500 MG tablet Take 4 tablets (2,000 mg total) by mouth 2 (two) times daily after a meal. Take as instructed by MD. Start on 06/09/2022 09/02/22   Volanda Napoleon, MD  CVS GENTLE LAXATIVE 5 MG EC tablet Take 5 mg by mouth daily as needed. 05/28/21   [provider]  [employer]S PURELAX 17 GM/SCOOP powder Take 17 g by mouth daily as needed. 05/28/21   [provider]  dexamethasone (DECADRON) 4 MG tablet Take 2 tablets (8 mg total) by mouth daily. Start the day after chemo for 2 days. 03/06/22   Volanda Napoleon, MD  diclofenac Sodium (VOLTAREN) 1 % GEL Apply 4 g topically 4 (four) times daily. 05/15/22   Deno Etienne, DO  feeding supplement, ENSURE ENLIVE, (ENSURE ENLIVE) LIQD Take 237 mLs by mouth 3 (three) times daily between meals. 05/02/20   Sheikh, Omair Latif, DO  glipiZIDE (GLUCOTROL) 5 MG tablet TAKE 1  TABLET BY MOUTH TWICE A DAY BEFORE MEALS 11/04/19   Dorena Dew, FNP  HYDROcodone-acetaminophen (NORCO/VICODIN) 5-325 MG tablet Take 1 tablet by mouth every 6 (six) hours as needed for moderate pain. 05/21/22   Volanda Napoleon, MD  KLOR-CON M20 20 MEQ tablet TAKE 1 TABLET BY MOUTH TWICE A DAY 03/03/22   Volanda Napoleon, MD  levothyroxine (SYNTHROID) 75 MCG tablet TAKE 1 TABLET BY MOUTH EVERY DAY 01/02/20   Dorena Dew, FNP  lidocaine-prilocaine (EMLA) cream Apply 1 Application topically as needed. 09/03/22   Volanda Napoleon, MD  loperamide (IMODIUM) 2 MG capsule TAKE 2 CAPSULES (4 MG TOTAL) BY MOUTH AS NEEDED. TAKE 2 AT DIARRHEA ONSET , THEN 1 EVERY 2HR UNTIL 12HRS WITH NO BM. MAY TAKE 2 EVERY 4HRS AT NIGHT. IF DIARRHEA RECURS REPEAT. 10/22/21   Volanda Napoleon, MD  metFORMIN (GLUCOPHAGE) 500 MG tablet Take 0.5 tablets (250 mg total) by mouth 2 (two) times daily with a meal. Patient taking differently: Take 500 mg by mouth 2 (two) times daily with a meal. 05/16/19   [name], FNP  methocarbamol (ROBAXIN) 500 MG tablet TAKE 1 TABLET BY MOUTH EVERY 12 HOURS AS NEEDED FOR MUSCLE SPASMS. 01/27/22   Volanda Napoleon, MD  metoprolol tartrate (LOPRESSOR) 25 MG tablet Take 1 tablet (25 mg total) by mouth 2 (two) times daily. 08/02/19  Tresa Garter, MD  Multiple Vitamin (MULTIVITAMIN WITH MINERALS) TABS tablet Take 1 tablet by mouth daily. 05/02/20   Sheikh, Omair Latif, DO  ondansetron (ZOFRAN) 8 MG tablet TAKE 1 TABLET (8 MG TOTAL) BY MOUTH 2 (TWO) TIMES DAILY AS NEEDED FOR REFRACTORY NAUSEA / VOMITING. START ON DAY 3 AFTER CHEMOTHERAPY. 02/10/22   Volanda Napoleon, MD  pantoprazole (PROTONIX) 20 MG tablet Take 1 tablet (20 mg total) by mouth 2 (two) times daily. 05/06/22   Volanda Napoleon, MD  prednisoLONE acetate (PRED FORTE) 1 % ophthalmic suspension Place 1 drop into both eyes every Monday.    [provider]  prochlorperazine (COMPAZINE) 10 MG tablet Take 1 tablet (10 mg  total) by mouth every 6 (six) hours as needed (NAUSEA). 07/31/21   Volanda Napoleon, MD  silver sulfADIAZINE (SILVADENE) 1 % cream Apply 1 application topically daily. 10/14/21   Volanda Napoleon, MD  Vitamin D, Ergocalciferol, (DRISDOL) 1.25 MG (50000 UNIT) CAPS capsule Take 50,000 Units by mouth once a week. 08/11/21   [provider]      Allergies    Nitroglycerin and Ampicillin    Review of Systems   Review of Systems  All other systems reviewed and are negative.   Physical Exam Updated Vital Signs BP 121/72 (BP Location: Left Arm)   Pulse (!) 125   Temp 98.1 F (36.7 C) (Oral)   Resp 16   Ht 1.524 m (5')   Wt 72.1 kg   SpO2 98%   BMI 31.05 kg/m  Physical Exam Vitals and nursing note reviewed.  Constitutional:      General: She is not in acute distress.    Appearance: Normal appearance. She is well-developed. She is not toxic-appearing.  HENT:     Head: Normocephalic and atraumatic.  Eyes:     General: Lids are normal.     Conjunctiva/sclera: Conjunctivae normal.     Pupils: Pupils are equal, round, and reactive to light.  Neck:     Thyroid: No thyroid mass.     Trachea: No tracheal deviation.  Cardiovascular:     Rate and Rhythm: Regular rhythm. Tachycardia present.     Heart sounds: Normal heart sounds. No murmur heard.    No gallop.  Pulmonary:     Effort: Pulmonary effort is normal. No respiratory distress.     Breath sounds: Normal breath sounds. No stridor. No decreased breath sounds, wheezing, rhonchi or rales.  Abdominal:     General: There is no distension.     Palpations: Abdomen is soft.     Tenderness: There is generalized abdominal tenderness. There is no guarding or rebound.    Musculoskeletal:        General: No tenderness. Normal range of motion.     Cervical back: Normal range of motion and neck supple.  Skin:    General: Skin is warm and dry.     Findings: No abrasion or rash.  Neurological:     Mental Status: She is alert and  oriented to person, place, and time. Mental status is at baseline.     GCS: GCS eye subscore is 4. GCS verbal subscore is 5. GCS motor subscore is 6.     Cranial Nerves: No cranial nerve deficit.     Sensory: No sensory deficit.     Motor: Motor function is intact.  Psychiatric:        Attention and Perception: Attention normal.        Speech:  Speech normal.        Behavior: Behavior normal.     ED Results / Procedures / Treatments   Labs (all labs ordered are listed, but only abnormal results are displayed) Labs Reviewed  GASTROINTESTINAL PANEL BY PCR, STOOL (REPLACES STOOL CULTURE)  C DIFFICILE QUICK SCREEN W PCR REFLEX    CBC WITH DIFFERENTIAL/PLATELET  COMPREHENSIVE METABOLIC PANEL  LIPASE, BLOOD  URINALYSIS, ROUTINE W REFLEX MICROSCOPIC  LACTIC ACID, PLASMA  LACTIC ACID, PLASMA    EKG None  Radiology No results found.  Procedures Procedures    Medications Ordered in ED Medications  lactated ringers bolus 1,000 mL (has no administration in time range)  lactated ringers infusion (has no administration in time range)    ED Course/ Medical Decision Making/ A&P                           Medical Decision Making Amount and/or Complexity of Data Reviewed Labs: ordered. Radiology: ordered.  Risk Prescription drug management.   Patient given IV fluids for her tachycardia.  Concern for possible obstruction and abdominal CT without contrast my interpretation shows evidence of obstruction.  Patient has had urinary symptoms.  Cath UA ordered at this time.  Patient's tachycardia will treat with IV fluids.  She is hypokalemic with potassium of 3.  Likely from diarrhea losses.  Remainder workup is pending at this time.  Care turned over to Dr. Tyrone Nine        Final Clinical Impression(s) / ED Diagnoses Final diagnoses:  None    Rx / DC Orders ED Discharge Orders     None         Lacretia Leigh, MD 09/05/22 2319

## 2022-09-06 ENCOUNTER — Other Ambulatory Visit: Payer: Self-pay

## 2022-09-06 DIAGNOSIS — C187 Malignant neoplasm of sigmoid colon: Secondary | ICD-10-CM | POA: Diagnosis not present

## 2022-09-06 DIAGNOSIS — K591 Functional diarrhea: Secondary | ICD-10-CM | POA: Diagnosis not present

## 2022-09-06 DIAGNOSIS — K529 Noninfective gastroenteritis and colitis, unspecified: Secondary | ICD-10-CM | POA: Diagnosis not present

## 2022-09-06 DIAGNOSIS — R040 Epistaxis: Secondary | ICD-10-CM | POA: Diagnosis present

## 2022-09-06 DIAGNOSIS — Z7984 Long term (current) use of oral hypoglycemic drugs: Secondary | ICD-10-CM | POA: Diagnosis not present

## 2022-09-06 DIAGNOSIS — Z6830 Body mass index (BMI) 30.0-30.9, adult: Secondary | ICD-10-CM | POA: Diagnosis not present

## 2022-09-06 DIAGNOSIS — E669 Obesity, unspecified: Secondary | ICD-10-CM | POA: Diagnosis present

## 2022-09-06 DIAGNOSIS — R197 Diarrhea, unspecified: Secondary | ICD-10-CM | POA: Diagnosis present

## 2022-09-06 DIAGNOSIS — Z923 Personal history of irradiation: Secondary | ICD-10-CM | POA: Diagnosis not present

## 2022-09-06 DIAGNOSIS — Z1152 Encounter for screening for COVID-19: Secondary | ICD-10-CM | POA: Diagnosis not present

## 2022-09-06 DIAGNOSIS — Z888 Allergy status to other drugs, medicaments and biological substances status: Secondary | ICD-10-CM | POA: Diagnosis not present

## 2022-09-06 DIAGNOSIS — E785 Hyperlipidemia, unspecified: Secondary | ICD-10-CM | POA: Diagnosis present

## 2022-09-06 DIAGNOSIS — C772 Secondary and unspecified malignant neoplasm of intra-abdominal lymph nodes: Secondary | ICD-10-CM | POA: Diagnosis present

## 2022-09-06 DIAGNOSIS — K219 Gastro-esophageal reflux disease without esophagitis: Secondary | ICD-10-CM | POA: Diagnosis present

## 2022-09-06 DIAGNOSIS — Z87891 Personal history of nicotine dependence: Secondary | ICD-10-CM | POA: Diagnosis not present

## 2022-09-06 DIAGNOSIS — E1165 Type 2 diabetes mellitus with hyperglycemia: Secondary | ICD-10-CM | POA: Diagnosis present

## 2022-09-06 DIAGNOSIS — E039 Hypothyroidism, unspecified: Secondary | ICD-10-CM | POA: Diagnosis present

## 2022-09-06 DIAGNOSIS — I10 Essential (primary) hypertension: Secondary | ICD-10-CM | POA: Diagnosis present

## 2022-09-06 DIAGNOSIS — A084 Viral intestinal infection, unspecified: Secondary | ICD-10-CM | POA: Diagnosis present

## 2022-09-06 DIAGNOSIS — Z85038 Personal history of other malignant neoplasm of large intestine: Secondary | ICD-10-CM | POA: Diagnosis not present

## 2022-09-06 DIAGNOSIS — C7800 Secondary malignant neoplasm of unspecified lung: Secondary | ICD-10-CM | POA: Diagnosis present

## 2022-09-06 DIAGNOSIS — E876 Hypokalemia: Secondary | ICD-10-CM | POA: Diagnosis present

## 2022-09-06 DIAGNOSIS — Z7982 Long term (current) use of aspirin: Secondary | ICD-10-CM | POA: Diagnosis not present

## 2022-09-06 DIAGNOSIS — Z79890 Hormone replacement therapy (postmenopausal): Secondary | ICD-10-CM | POA: Diagnosis not present

## 2022-09-06 DIAGNOSIS — Z933 Colostomy status: Secondary | ICD-10-CM | POA: Diagnosis not present

## 2022-09-06 DIAGNOSIS — Z803 Family history of malignant neoplasm of breast: Secondary | ICD-10-CM | POA: Diagnosis not present

## 2022-09-06 DIAGNOSIS — Z8249 Family history of ischemic heart disease and other diseases of the circulatory system: Secondary | ICD-10-CM | POA: Diagnosis not present

## 2022-09-06 LAB — GASTROINTESTINAL PANEL BY PCR, STOOL (REPLACES STOOL CULTURE)

## 2022-09-06 LAB — CBC
HCT: 30.4 % — ABNORMAL LOW (ref 36.0–46.0)
Hemoglobin: 10.2 g/dL — ABNORMAL LOW (ref 12.0–15.0)
MCH: 34.5 pg — ABNORMAL HIGH (ref 26.0–34.0)
MCHC: 33.6 g/dL (ref 30.0–36.0)
MCV: 102.7 fL — ABNORMAL HIGH (ref 80.0–100.0)
Platelets: 162 10*3/uL (ref 150–400)
RBC: 2.96 MIL/uL — ABNORMAL LOW (ref 3.87–5.11)
RDW: 16.5 % — ABNORMAL HIGH (ref 11.5–15.5)
WBC: 4 10*3/uL (ref 4.0–10.5)
nRBC: 0 % (ref 0.0–0.2)

## 2022-09-06 LAB — URINALYSIS, ROUTINE W REFLEX MICROSCOPIC
Bilirubin Urine: NEGATIVE
Glucose, UA: NEGATIVE mg/dL
Hgb urine dipstick: NEGATIVE
Ketones, ur: NEGATIVE mg/dL
Leukocytes,Ua: NEGATIVE
Nitrite: NEGATIVE
Protein, ur: NEGATIVE mg/dL
Specific Gravity, Urine: 1.011 (ref 1.005–1.030)
pH: 6 (ref 5.0–8.0)

## 2022-09-06 LAB — COMPREHENSIVE METABOLIC PANEL
ALT: 17 U/L (ref 0–44)
AST: 17 U/L (ref 15–41)
Albumin: 3.5 g/dL (ref 3.5–5.0)
Alkaline Phosphatase: 28 U/L — ABNORMAL LOW (ref 38–126)
Anion gap: 9 (ref 5–15)
BUN: 13 mg/dL (ref 8–23)
CO2: 22 mmol/L (ref 22–32)
Calcium: 8.5 mg/dL — ABNORMAL LOW (ref 8.9–10.3)
Chloride: 107 mmol/L (ref 98–111)
Creatinine, Ser: 0.58 mg/dL (ref 0.44–1.00)
GFR, Estimated: >60 mL/min (ref >60)
Glucose, Bld: 139 mg/dL — ABNORMAL HIGH (ref 70–99)
Potassium: 2.5 mmol/L — CL (ref 3.5–5.1)
Sodium: 138 mmol/L (ref 135–145)
Total Bilirubin: 1 mg/dL (ref 0.3–1.2)
Total Protein: 6.3 g/dL — ABNORMAL LOW (ref 6.5–8.1)

## 2022-09-06 LAB — LACTIC ACID, PLASMA: Lactic Acid, Venous: 2.8 mmol/L (ref 0.5–1.9)

## 2022-09-06 LAB — CBG MONITORING, ED
Glucose-Capillary: 140 mg/dL — ABNORMAL HIGH (ref 70–99)
Glucose-Capillary: 150 mg/dL — ABNORMAL HIGH (ref 70–99)
Glucose-Capillary: 143 mg/dL — ABNORMAL HIGH (ref 70–99)
Glucose-Capillary: 127 mg/dL — ABNORMAL HIGH (ref 70–99)

## 2022-09-06 LAB — RESP PANEL BY RT-PCR (RSV, FLU A&B, COVID)  RVPGX2
Influenza A by PCR: NEGATIVE
Influenza B by PCR: NEGATIVE
Resp Syncytial Virus by PCR: NEGATIVE
SARS Coronavirus 2 by RT PCR: NEGATIVE

## 2022-09-06 LAB — PHOSPHORUS: Phosphorus: 3.3 mg/dL (ref 2.5–4.6)

## 2022-09-06 LAB — C DIFFICILE QUICK SCREEN W PCR REFLEX
C Diff antigen: NEGATIVE
C Diff interpretation: NOT DETECTED
C Diff toxin: NEGATIVE

## 2022-09-06 LAB — GLUCOSE, CAPILLARY: Glucose-Capillary: 126 mg/dL — ABNORMAL HIGH (ref 70–99)

## 2022-09-06 LAB — MAGNESIUM: Magnesium: 1.4 mg/dL — ABNORMAL LOW (ref 1.7–2.4)

## 2022-09-06 MED ORDER — ACETAMINOPHEN 325 MG PO TABS: 650.0000 mg | ORAL_TABLET | Freq: Four times a day (QID) | ORAL | Status: DC | PRN

## 2022-09-06 MED ORDER — INSULIN ASPART 100 UNIT/ML IJ SOLN
0.0000 [IU] | Freq: Three times a day (TID) | INTRAMUSCULAR | Status: DC
  Administered 2022-09-06 – 2022-09-08 (×6): 1 [IU] via SUBCUTANEOUS
  Filled 2022-09-06: qty 0.09

## 2022-09-06 MED ORDER — ATORVASTATIN CALCIUM 20 MG PO TABS
40.0000 mg | ORAL_TABLET | Freq: Every day | ORAL | Status: DC
  Administered 2022-09-06 – 2022-09-09 (×4): 40 mg via ORAL
  Filled 2022-09-06 (×2): qty 2
  Filled 2022-09-06: qty 1
  Filled 2022-09-06: qty 2

## 2022-09-06 MED ORDER — LEVOTHYROXINE SODIUM 75 MCG PO TABS
75.0000 ug | ORAL_TABLET | Freq: Every day | ORAL | Status: DC
  Administered 2022-09-06 – 2022-09-09 (×4): 75 ug via ORAL
  Filled 2022-09-06 (×4): qty 1

## 2022-09-06 MED ORDER — POTASSIUM CHLORIDE CRYS ER 20 MEQ PO TBCR
40.0000 meq | EXTENDED_RELEASE_TABLET | Freq: Once | ORAL | Status: AC
  Administered 2022-09-06: 40 meq via ORAL
  Filled 2022-09-06: qty 2

## 2022-09-06 MED ORDER — POTASSIUM CHLORIDE 10 MEQ/100ML IV SOLN
10.0000 meq | INTRAVENOUS | Status: AC
  Administered 2022-09-06 (×6): 10 meq via INTRAVENOUS
  Filled 2022-09-06 (×5): qty 100

## 2022-09-06 MED ORDER — METOPROLOL TARTRATE 25 MG PO TABS
12.5000 mg | ORAL_TABLET | Freq: Two times a day (BID) | ORAL | Status: DC
  Administered 2022-09-06 – 2022-09-07 (×3): 12.5 mg via ORAL
  Filled 2022-09-06 (×3): qty 1

## 2022-09-06 MED ORDER — MELATONIN 3 MG PO TABS
3.0000 mg | ORAL_TABLET | Freq: Every evening | ORAL | Status: DC | PRN
  Administered 2022-09-07 – 2022-09-08 (×2): 3 mg via ORAL
  Filled 2022-09-06 (×2): qty 1

## 2022-09-06 MED ORDER — CHLORHEXIDINE GLUCONATE CLOTH 2 % EX PADS
6.0000 | MEDICATED_PAD | Freq: Every day | CUTANEOUS | Status: DC
  Administered 2022-09-07 – 2022-09-09 (×3): 6 via TOPICAL

## 2022-09-06 MED ORDER — INSULIN ASPART 100 UNIT/ML IJ SOLN
0.0000 [IU] | Freq: Every day | INTRAMUSCULAR | Status: DC
  Filled 2022-09-06: qty 0.05

## 2022-09-06 MED ORDER — ENOXAPARIN SODIUM 40 MG/0.4ML IJ SOSY
40.0000 mg | PREFILLED_SYRINGE | INTRAMUSCULAR | Status: DC
  Administered 2022-09-06 – 2022-09-07 (×2): 40 mg via SUBCUTANEOUS
  Filled 2022-09-06 (×2): qty 0.4

## 2022-09-06 MED ORDER — PROCHLORPERAZINE EDISYLATE 10 MG/2ML IJ SOLN
5.0000 mg | Freq: Four times a day (QID) | INTRAMUSCULAR | Status: DC | PRN
  Administered 2022-09-06 (×2): 5 mg via INTRAVENOUS
  Filled 2022-09-06 (×2): qty 2

## 2022-09-06 MED ORDER — MAGNESIUM SULFATE 4 GM/100ML IV SOLN
4.0000 g | Freq: Once | INTRAVENOUS | Status: AC
  Administered 2022-09-06: 4 g via INTRAVENOUS
  Filled 2022-09-06: qty 100

## 2022-09-06 MED ORDER — SACCHAROMYCES BOULARDII 250 MG PO CAPS
250.0000 mg | ORAL_CAPSULE | Freq: Two times a day (BID) | ORAL | Status: DC
  Administered 2022-09-06 – 2022-09-09 (×6): 250 mg via ORAL
  Filled 2022-09-06 (×6): qty 1

## 2022-09-06 NOTE — H&P (Addendum)
History and Physical  Bailey Walker MCN:[id number] DOB: 03-Apr-1946 DOA: 09/05/2022  Referring physician: Dr. Tyrone Nine, [place]. PCP: [name], MD  Outpatient Specialists: Oncology Patient coming from: Home.  Chief Complaint: Nausea vomiting, abdominal pain and diarrhea.  HPI: Bailey Walker is a 76 y.o. female with medical history significant for cancer of sigmoid colon followed by Dr. [facility] Olp, who presented from home with complaints of nausea vomiting, diffuse abdominal pain and diarrhea through her colostomy bag x 1 day.  She is in the room accompanied by her niece whom she lives with.  No subjective fevers or chills.  C. difficile PCR negative.  CT scan abdomen pelvis without contrast done in the ED revealed no evidence of bowel obstruction.  No evidence of acute bowel wall thickening.  Multiple peritoneal nodules consistent with metastatic disease, grossly stable as compared with most recent prior.  Pulmonary metastatic nodules.    Due to persistent diarrhea and concern for dehydration, EDP requested admission.  The patient was admitted by [facility], hospitalist service.  ED Course: Tmax 98.1.  BP 122/69, pulse 96, respiratory 19, O2 saturation 96% on room air.  Lab studies remarkable for serum potassium 3.0, bicarb 19, glucose 163.  Review of Systems: Review of systems as noted in the HPI. All other systems reviewed and are negative.   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 Gery Pray   Hyperlipidemia    Hypertension    SVT (supraventricular tachycardia)    Thyroid disease    Past Surgical History:  Procedure Laterality Date   APPENDECTOMY     BIOPSY   04/18/2020   Procedure: BIOPSY;  Surgeon: Lavena Bullion, DO;  Location: [place] [facility];  Service: Gastroenterology;;   BIOPSY  04/19/2020   Procedure: BIOPSY;  Surgeon: Lavena Bullion, DO;  Location: [place] [facility];  Service: Gastroenterology;;   BREAST SURGERY     Fatty tissue on biopsy   ESOPHAGOGASTRODUODENOSCOPY (EGD) WITH PROPOFOL N/A 04/18/2020   Procedure: ESOPHAGOGASTRODUODENOSCOPY (EGD) WITH PROPOFOL;  Surgeon: Lavena Bullion, DO;  Location: [place];  Service: Gastroenterology;  Laterality: N/A;   EYE SURGERY Bilateral    april and march 2019 for cataracts.    fatty gland     left wrist , right breast   FLEXIBLE SIGMOIDOSCOPY N/A 04/18/2020   Procedure: FLEXIBLE SIGMOIDOSCOPY;  Surgeon: Lavena Bullion, DO;  Location: [place];  Service: Gastroenterology;  Laterality: N/A;   FLEXIBLE SIGMOIDOSCOPY N/A 04/19/2020   Procedure: FLEXIBLE SIGMOIDOSCOPY;  Surgeon: Lavena Bullion, DO;  Location: [place];  Service: Gastroenterology;  Laterality: N/A;   INCISION AND DRAINAGE OF WOUND N/A 05/01/2020   Procedure: ABDOMINAL  WOUND EXPLORATION; IRRIGATION AND DEBRIDEMENT WOUND;  Surgeon: Rolm Bookbinder, MD;  Location: [place];  Service: General;  Laterality: N/A;   IR IMAGING GUIDED PORT INSERTION  06/13/2020   KNEE SURGERY     left knee   LAPAROTOMY N/A 04/20/2020   Procedure: SIGMOID COLECTOMY AND COLOSTOMY;  Surgeon: Coralie Keens, MD;  Location: [place];  Service: General;  Laterality: N/A;   SUBMUCOSAL TATTOO INJECTION  04/19/2020   Procedure: SUBMUCOSAL TATTOO INJECTION;  Surgeon: Lavena Bullion, DO;  Location: [place];  Service:  Gastroenterology;;    Social History:  reports that she quit smoking about 8 years ago. Her smoking use included cigarettes. She has never used smokeless tobacco. She reports that she does not drink alcohol and does not use drugs.   Allergies  Allergen Reactions   Nitroglycerin Nausea And Vomiting   Ampicillin Other (See  Comments)    Per allergy test     Family History  Problem Relation Age of Onset   Breast cancer Cousin        mat and pat sides   Cancer Sister        cancer everywhere   Heart disease Brother    Colon cancer Neg Hx    Esophageal cancer Neg Hx    Rectal cancer Neg Hx       Prior to Admission medications   Medication Sig Start Date End Date Taking? Authorizing Provider  acetaminophen (TYLENOL) 500 MG tablet Take 500 mg by mouth every 6 (six) hours as needed for moderate pain.   Yes [provider]  aspirin EC 81 MG tablet Take 81 mg by mouth daily. Swallow whole.   Yes [provider]  atorvastatin (LIPITOR) 40 MG tablet Take 1 tablet by mouth daily. 02/09/22  Yes [provider]  diclofenac Sodium (VOLTAREN) 1 % GEL Apply 4 g topically 4 (four) times daily. Patient taking differently: Apply 4 g topically 4 (four) times daily as needed (pain). 05/15/22  Yes Tyrone Nine, Dan, DO  glipiZIDE (GLUCOTROL) 5 MG tablet TAKE 1 TABLET BY MOUTH TWICE A DAY BEFORE MEALS 11/04/19  Yes Dorena Dew, FNP  levothyroxine (SYNTHROID) 75 MCG tablet TAKE 1 TABLET BY MOUTH EVERY DAY 01/02/20  Yes Dorena Dew, FNP  lidocaine-prilocaine (EMLA) cream Apply 1 Application topically as needed. Patient taking differently: Apply 1 Application topically as needed (access port). 09/03/22  Yes Ennever, Rudell Cobb, MD  loperamide (IMODIUM) 2 MG capsule TAKE 2 CAPSULES (4 MG TOTAL) BY MOUTH AS NEEDED. TAKE 2 AT DIARRHEA ONSET , THEN 1 EVERY 2HR UNTIL 12HRS WITH NO BM. MAY TAKE 2 EVERY 4HRS AT NIGHT. IF DIARRHEA RECURS REPEAT. 10/22/21  Yes Ennever, Rudell Cobb, MD  metFORMIN (GLUCOPHAGE) 500 MG tablet Take 0.5 tablets (250 mg total) by mouth 2 (two) times daily with a meal. Patient taking differently: Take 500 mg by mouth 2 (two) times daily with a meal. 05/16/19  Yes [name], FNP  metoprolol tartrate (LOPRESSOR) 25 MG tablet Take 1 tablet (25 mg total) by mouth 2 (two) times daily. 08/02/19   Yes Tresa Garter, MD  Multiple Vitamin (MULTIVITAMIN WITH MINERALS) TABS tablet Take 1 tablet by mouth daily. 05/02/20  Yes Sheikh, Omair Latif, DO  pantoprazole (PROTONIX) 20 MG tablet Take 1 tablet (20 mg total) by mouth 2 (two) times daily. Patient taking differently: Take 20 mg by mouth daily. 05/06/22  Yes Volanda Napoleon, MD  prednisoLONE acetate (PRED FORTE) 1 % ophthalmic suspension Place 1 drop into both eyes every Monday.   Yes [provider]  traMADol (ULTRAM) 50 MG tablet Take 50 mg by mouth every 6 (six) hours as needed for moderate pain or severe pain.   Yes [provider]  alendronate (FOSAMAX) 70 MG tablet Take 70 mg by mouth every Sunday. 02/22/21   [provider]  capecitabine (XELODA) 500 MG tablet Take 4 tablets (2,000 mg total) by mouth 2 (two) times daily after a meal. Take as instructed by MD. Start on 06/09/2022 09/02/22   Ennever,  Rudell Cobb, MD  dexamethasone (DECADRON) 4 MG tablet Take 2 tablets (8 mg total) by mouth daily. Start the day after chemo for 2 days. Patient not taking: Reported on 09/06/2022 03/06/22   Volanda Napoleon, MD  feeding supplement, ENSURE ENLIVE, (ENSURE ENLIVE) LIQD Take 237 mLs by mouth 3 (three) times daily between meals. Patient not taking: Reported on 09/06/2022 05/02/20   Raiford Noble Latif, DO  HYDROcodone-acetaminophen (NORCO/VICODIN) 5-325 MG tablet Take 1 tablet by mouth every 6 (six) hours as needed for moderate pain. Patient not taking: Reported on 09/06/2022 05/21/22   Volanda Napoleon, MD  KLOR-CON M20 20 MEQ tablet TAKE 1 TABLET BY MOUTH TWICE A DAY Patient not taking: Reported on 09/06/2022 03/03/22   Volanda Napoleon, MD  methocarbamol (ROBAXIN) 500 MG tablet TAKE 1 TABLET BY MOUTH EVERY 12 HOURS AS NEEDED FOR MUSCLE SPASMS. Patient not taking: Reported on 09/06/2022 01/27/22   Volanda Napoleon, MD  ondansetron (ZOFRAN) 8 MG tablet TAKE 1 TABLET (8 MG TOTAL) BY MOUTH 2 (TWO) TIMES DAILY AS NEEDED FOR  REFRACTORY NAUSEA / VOMITING. START ON DAY 3 AFTER CHEMOTHERAPY. Patient not taking: Reported on 09/06/2022 02/10/22   Volanda Napoleon, MD  prochlorperazine (COMPAZINE) 10 MG tablet Take 1 tablet (10 mg total) by mouth every 6 (six) hours as needed (NAUSEA). Patient not taking: Reported on 09/06/2022 07/31/21   Volanda Napoleon, MD  silver sulfADIAZINE (SILVADENE) 1 % cream Apply 1 application topically daily. Patient not taking: Reported on 09/06/2022 10/14/21   Volanda Napoleon, MD    Physical Exam: BP 98/79   Pulse (!) 111   Temp 98.1 F (36.7 C) (Oral)   Resp 17   Ht 5' (1.524 m)   Wt 72.1 kg   SpO2 99%   BMI 31.05 kg/m   General: 76 y.o. year-old female well developed well nourished in no acute distress.  Alert and oriented x3. Cardiovascular: Regular rate and rhythm with no rubs or gallops.  No thyromegaly or JVD noted.  No lower extremity edema. 2/4 pulses in all 4 extremities. Respiratory: Clear to auscultation with no wheezes or rales. Good inspiratory effort. Abdomen: Soft diffuse tenderness with palpation worse in epigastric region.  Nondistended with normal bowel sounds x4 quadrants. Muskuloskeletal: No cyanosis, clubbing or edema noted bilaterally Neuro: CN II-XII intact, strength, sensation, reflexes Skin: No ulcerative lesions noted or rashes Psychiatry: Judgement and insight appear normal. Mood is appropriate for condition and setting          Labs on Admission:  Basic Metabolic Panel: Recent Labs  Lab 09/03/22 1525 09/05/22 2135  NA 138 137  K 3.4* 3.0*  CL 103 108  CO2 25 19*  GLUCOSE 136* 163*  BUN 15 16  CREATININE 0.56 0.67  CALCIUM 8.9 8.7*   Liver Function Tests: Recent Labs  Lab 09/03/22 1525 09/05/22 2135  AST 24 22  ALT 21 21  ALKPHOS 36* 32*  BILITOT 0.6 1.2  PROT 7.3 6.9  ALBUMIN 4.4 4.0   Recent Labs  Lab 09/05/22 2135  LIPASE 23   No results for input(s): "AMMONIA" in the last 168 hours. CBC: Recent Labs  Lab 09/03/22 1525  09/05/22 2135  WBC 7.2 5.3  NEUTROABS 4.6 2.8  HGB 11.0* 12.1  HCT 32.4* 36.1  MCV 101.3* 102.0*  PLT 187 210   Cardiac Enzymes: No results for input(s): "CKTOTAL", "CKMB", "CKMBINDEX", "TROPONINI" in the last 168 hours.  BNP (last 3 results) No results for input(s): "BNP"  in the last 8760 hours.  ProBNP (last 3 results) No results for input(s): "PROBNP" in the last 8760 hours.  CBG: No results for input(s): "GLUCAP" in the last 168 hours.  Radiological Exams on Admission: DG Chest Port 1 View  Result Date: 09/05/2022 CLINICAL DATA:  Lower abdominal pain and vomiting EXAM: PORTABLE CHEST 1 VIEW COMPARISON:  CT 08/06/2022 FINDINGS: Stable cardiomediastinal silhouette. Accessed right chest wall Port-A-Cath with tip at the superior cavoatrial junction. Left basilar atelectasis. No focal consolidation, pleural effusion, or pneumothorax. No acute osseous abnormality. IMPRESSION: No active disease. Electronically Signed   By: Placido Sou M.D.   On: 09/05/2022 23:30   CT ABDOMEN PELVIS WO CONTRAST  Addendum Date: 09/05/2022   ADDENDUM REPORT: 09/05/2022 22:50 ADDENDUM: Additional impression grossly stable poorly defined left adnexal mass. Electronically Signed   By: Donavan Foil M.D.   On: 09/05/2022 22:50   Result Date: 09/05/2022 CLINICAL DATA:  History of colon cancer colectomy nausea vomiting EXAM: CT ABDOMEN AND PELVIS WITHOUT CONTRAST TECHNIQUE: Multidetector CT imaging of the abdomen and pelvis was performed following the standard protocol without IV contrast. RADIATION DOSE REDUCTION: This exam was performed according to the departmental dose-optimization program which includes automated exposure control, adjustment of the mA and/or kV according to patient size and/or use of iterative reconstruction technique. COMPARISON:  CT 08/06/2022, 04/05/2022, PET CT 06/24/2021, CT 04/16/2020 FINDINGS: Lower chest: Lung bases demonstrate no acute airspace disease 6 mm left lung base  pulmonary nodule series 4, image 18, stable as compared with CT from November, slightly increased when compared to the CT examinations from July in April. The previously described nodule at the anterior right lung base on the most recent prior is regressed with minimal ground-glass density in the region, series 4, image 12. No acute airspace disease. Hepatobiliary: No calcified gallstone. Small liver granuloma. No biliary dilatation Pancreas: Fatty atrophy.  No inflammation Spleen: Normal in size without focal abnormality. Adrenals/Urinary Tract: Adrenal glands are unremarkable. Kidneys are normal, without renal calculi, focal lesion, or hydronephrosis. Bladder is unremarkable. Stomach/Bowel: The stomach is nonenlarged. No dilated small bowel. Status post distal left colectomy with left abdominal colostomy. 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 No evidence for acute bowel wall thickening. Vascular/Lymphatic: Moderate aortic atherosclerosis. No aneurysm. No increasing lymph nodes. Reproductive: Poorly defined left adnexal mass measuring about 5.7 by 3.4 cm, previously 5.7 x 3.2 cm. This is grossly unchanged. Other: Negative for free air or pelvic effusion. Multiple peritoneal nodules are again evident. Right lower quadrant anterior peritoneal nodule measures 2.3 by 1.5 cm on series 2, image 46, previously 2.3 x 1.5 cm when measured in a similar fashion. Anterior pelvic peritoneal nodule measures 1.2 by 1.2 cm, previously 1.2 by 0.9 cm. Additional peritoneal nodules and nodularity without gross interval change. Slight strandy appearance the mesentery in the pelvis, for example series 2, image 57, and within the left lower quadrant peristomal hernia. Musculoskeletal: No acute osseous abnormality. Multilevel degenerative changes. IMPRESSION: 1. No CT evidence for acute intra-abdominal or pelvic abnormality. No evidence for bowel obstruction or bowel wall  thickening. 2. Status post distal left colectomy with left lower quadrant colostomy. 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 3. Multiple peritoneal nodules consistent with metastatic disease, grossly stable as compared with most recent prior. Mild hazy thickening of the mesentery, attention on follow-up imaging to exclude disease progression. 4. Stable 6 mm left  lung base pulmonary metastatic nodule. The previously described right lung base pulmonary nodule is progressed compared to the recent CT. 5. Aortic atherosclerosis. Aortic Atherosclerosis (ICD10-I70.0). Electronically Signed: By: Donavan Foil M.D. On: 09/05/2022 22:41    EKG: I independently viewed the EKG done and my findings are as followed: None available at the time of this visit.  Assessment/Plan Present on Admission:  Diarrhea  Principal Problem:   Diarrhea  Intractable diarrhea/nausea vomiting, unclear etiology, POA C. difficile PCR negative Suspect viral gastroenteritis Continue supportive care IV fluid hydration  Cancer of sigmoid colon status post distal left colectomy with left lower quadrant colostomy. Followed by Dr. [facility] Olp  Hypothyroidism Resume home levothyroxine  Type 2 diabetes with hyperglycemia Hold off home oral hypoglycemics Obtain hemoglobin A1c Start insulin sliding scale.  Hypokalemia Serum potassium 3.0 Repleted Replete electrolytes as indicated.  Hyperlipidemia Resume home Lipitor.   DVT prophylaxis: Subcu Lovenox daily  Code Status: Full code  Family Communication: Niece at bedside  Disposition Plan: Admitted to telemetry unit  Consults called: None.  Admission status: Inpatient status.   Status is: Inpatient The patient requires at least 2 midnights for further evaluation and treatment of present condition.   Kayleen Memos MD [facility] Pager [phone]  If 7PM-7AM, please  contact night-coverage [email or web address] Password [facility]  09/06/2022, 4:38 AM

## 2022-09-06 NOTE — ED Notes (Signed)
RN assisted patient in daughter in change of colostomy bag. Daughter brought supplies from home, patient tolerated well, daughter remains at bed side no questions or concerns at this time.

## 2022-09-06 NOTE — Progress Notes (Signed)
Brief note: Patient was admitted earlier today. As per H&P done on admission: " Bailey Walker is a 76 y.o. female with medical history significant for cancer of sigmoid colon followed by Dr. [facility] Olp, who presented from home with complaints of nausea vomiting, diffuse abdominal pain and diarrhea through her colostomy bag x 1 day.  She is in the room accompanied by her niece whom she lives with.  No subjective fevers or chills.  C. difficile PCR negative.   CT scan abdomen pelvis without contrast done in the ED revealed no evidence of bowel obstruction.  No evidence of acute bowel wall thickening.  Multiple peritoneal nodules consistent with metastatic disease, grossly stable as compared with most recent prior.  Pulmonary metastatic nodules.     Due to persistent diarrhea and concern for dehydration, EDP requested admission.  The patient was admitted by [facility], hospitalist service.   ED Course: Tmax 98.1.  BP 122/69, pulse 96, respiratory 19, O2 saturation 96% on room air.  Lab studies remarkable for serum potassium 3.0, bicarb 19, glucose 163.  Assessment/Plan Present on Admission:  Diarrhea   Principal Problem:   Diarrhea   Intractable diarrhea/nausea vomiting, unclear etiology, POA C. 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 Followed by Dr. [facility] Olp   Hypothyroidism Resume home levothyroxine   Type 2 diabetes with hyperglycemia Hold off home oral hypoglycemics Obtain hemoglobin A1c Start insulin sliding scale.   Hypokalemia Serum potassium 3.0 Repleted Replete electrolytes as indicated.   Hyperlipidemia Resume home Lipitor".   09/06/2022: Patient seen alongside patient's niece.  Patient does not speak [place].  Patient's niece assisted with translation. -Patient has metastatic colon cancer.  Patient has colostomy  bag. -Vomiting and abdominal pain have resolved significantly. -Watery stool persists. -Potassium of 2.5 noted. -Mag of 1.4 noted. -IV KCl 10 Mg every lugdunensis doses. -IV magnesium. -Supportive care. -No fever or chills.  Examination: General condition: Not in any distress.  Awake and alert. HEENT: Pale.  No jaundice. Neck: Supple. Lungs: Clear to auscultation. CVS: S1-S2. Abdomen: Soft and nontender.  Left-sided colostomy bag. Neuro: Awake and alert.  Moves all extremities. Extremities no leg edema.

## 2022-09-07 ENCOUNTER — Encounter (HOSPITAL_COMMUNITY): Payer: Self-pay | Admitting: Internal Medicine

## 2022-09-07 DIAGNOSIS — R197 Diarrhea, unspecified: Secondary | ICD-10-CM | POA: Diagnosis not present

## 2022-09-07 LAB — GLUCOSE, CAPILLARY
Glucose-Capillary: 119 mg/dL — ABNORMAL HIGH (ref 70–99)
Glucose-Capillary: 126 mg/dL — ABNORMAL HIGH (ref 70–99)
Glucose-Capillary: 126 mg/dL — ABNORMAL HIGH (ref 70–99)
Glucose-Capillary: 128 mg/dL — ABNORMAL HIGH (ref 70–99)

## 2022-09-07 MED ORDER — CHOLESTYRAMINE LIGHT 4 G PO PACK
4.0000 g | PACK | Freq: Once | ORAL | Status: AC
  Administered 2022-09-07: 4 g via ORAL
  Filled 2022-09-07: qty 1

## 2022-09-07 MED ORDER — SODIUM CHLORIDE 0.9% FLUSH: 10.0000 mL | INTRAVENOUS | Status: DC | PRN

## 2022-09-07 MED ORDER — CHOLESTYRAMINE LIGHT 4 G PO PACK: 2.0000 g | PACK | Freq: Once | ORAL | Status: DC

## 2022-09-07 MED ORDER — LOPERAMIDE HCL 2 MG PO CAPS
2.0000 mg | ORAL_CAPSULE | ORAL | Status: DC | PRN
  Administered 2022-09-07 (×2): 2 mg via ORAL
  Filled 2022-09-07 (×2): qty 1

## 2022-09-07 MED ORDER — ONDANSETRON HCL 4 MG/2ML IJ SOLN: 4.0000 mg | Freq: Four times a day (QID) | INTRAMUSCULAR | Status: DC | PRN

## 2022-09-07 MED ORDER — DICYCLOMINE HCL 10 MG PO CAPS
10.0000 mg | ORAL_CAPSULE | Freq: Three times a day (TID) | ORAL | Status: DC
  Administered 2022-09-07 – 2022-09-09 (×7): 10 mg via ORAL
  Filled 2022-09-07 (×6): qty 1

## 2022-09-07 MED ORDER — OXYMETAZOLINE HCL 0.05 % NA SOLN
2.0000 | Freq: Four times a day (QID) | NASAL | Status: AC
  Administered 2022-09-07 (×3): 2 via NASAL
  Filled 2022-09-07: qty 15

## 2022-09-07 NOTE — Hospital Course (Signed)
PMH of colon cancer with colostomy, GERD, type II DM, HLD, HTN, SVT, hypothyroidism presented to the hospital with complaints of abdominal pain along with nausea vomiting and diarrhea. Suspected to have gastroenteritis.  C. difficile negative.  GI pathogen panel negative.

## 2022-09-07 NOTE — Progress Notes (Signed)
  Transition of Care [facility]) Screening Note   Patient Details  Name: Bailey Walker Date of Birth: 11-27-1945   Transition of Care [facility]) CM/SW Contact:    Henrietta Dine, RN Phone Number: [phone] 09/07/2022, 3:43 PM   Transition of Care Department [facility]) has reviewed patient and no TOC needs have been identified at this time. We will continue to monitor patient advancement through interdisciplinary progression rounds. If new patient transition needs arise, please place a TOC consult.

## 2022-09-07 NOTE — Progress Notes (Signed)
Pt's hand shaking when hold the cup and drinking. Per pt state, the shaking happened about two years ago when she diagnosed cancer.

## 2022-09-07 NOTE — Progress Notes (Signed)
[facility] Progress Note Patient: Bailey Walker BJS:[id number] DOB: 11-02-1945 DOA: 09/05/2022  DOS: the patient was seen and examined on 09/07/2022  Brief hospital course: PMH of colon cancer with colostomy, GERD, type II DM, HLD, HTN, SVT, hypothyroidism presented to the hospital with complaints of abdominal pain along with nausea vomiting and diarrhea. Suspected to have gastroenteritis.  C. difficile negative.  GI pathogen panel negative. Assessment and Plan: Intractable nausea and vomiting.  Currently resolved. Suspected viral gastroenteritis. Diarrhea. Patient reports changing her bag 6 times so far today. No blood in the stool reported. She reports that the diarrhea has been ongoing for last 7 days and then for last 3 days she is having abdominal pain which is located in the upper abdomen area. CT abdomen unremarkable for any acute abnormality. Lipase negative. Currently will add Bentyl. Continue Imodium.  Continue Florastor.  Continue Questran. Continue gentle IV hydration. Advance from clear liquid diet to full liquid diet and monitor.  History of colon cancer SP distal left colectomy with left lower quadrant colostomy creation. Patient is supposed to be on Xeloda.  She may have taken Xarelto for 6 weeks continuously. Last dose of Xeloda was last 12/13. Plan is for radiosurgery for right lung nodule. Monitor.  Hypokalemia. Hypomagnesemia. Currently being replaced. Monitor.  Hypothyroidism. On Synthroid. Monitor.  Type 2 diabetes mellitus uncontrolled with hyperglycemia without long-term current use of insulin. Currently on sliding scale insulin. Hemoglobin A1c pending.  Recurrent nosebleeding. Afrin spray on a scheduled basis.  Monitor.  Obesity Body mass index is 30.61 kg/m.  Placing the pt at higher risk of poor outcomes.    Subjective: Continues to have abdominal pain.  No nausea no vomiting no fever no chills.  Continues to have diarrhea.   Although feeling somewhat better compared to yesterday.  Reported some epistaxis this morning currently resolved.  Physical Exam: General: in mild distress;  Cardiovascular: S1 and S2 Present, no Murmur Respiratory: good respiratory effort, Bilateral Air entry present, no Crackles, no wheezes Abdomen: Bowel Sound present, upper abdomen tenderness Extremities: No edema Neurology: alert and oriented to time, place, and person  Interpreter was called earlier although patient preferred to use her knees and requested to disconnect the interpreter.  Data Reviewed: I have Reviewed nursing notes, Vitals, and Lab results. Since last encounter, pertinent lab results CBC and BMP   . I have ordered test including CBC and BMP  .   Disposition: Status is: Inpatient Remains inpatient appropriate because: Need to monitor for improvement in diarrhea and tolerance of oral diet.  Place and maintain sequential compression device Start: 09/07/22 1547   Family Communication: Discussed with niece on the phone Level of care: Med-Surg switch to MedSurg. Vitals:   09/07/22 0116 09/07/22 0611 09/07/22 0950 09/07/22 1309  BP: (!) 134/51 (!) 115/54 126/65 132/63  Pulse: 88 (!) 102 85 80  Resp: '17 16 16 16  '$ Temp: 98.5 F (36.9 C) 98.4 F (36.9 C) 98.2 F (36.8 C) 98 F (36.7 C)  TempSrc: Oral Oral Oral Oral  SpO2: 99% 99% 100% 98%  Weight:      Height:         Author: Berle Mull, MD 09/07/2022 8:05 PM  Please look on [email or web address] to find out who is on call.

## 2022-09-08 DIAGNOSIS — K591 Functional diarrhea: Secondary | ICD-10-CM

## 2022-09-08 DIAGNOSIS — R197 Diarrhea, unspecified: Secondary | ICD-10-CM | POA: Diagnosis not present

## 2022-09-08 LAB — CBC WITH DIFFERENTIAL/PLATELET
Abs Immature Granulocytes: 0.02 10*3/uL (ref 0.00–0.07)
Basophils Absolute: 0.1 10*3/uL (ref 0.0–0.1)
Basophils Relative: 1 %
Eosinophils Absolute: 0.2 10*3/uL (ref 0.0–0.5)
Eosinophils Relative: 4 %
HCT: 30.8 % — ABNORMAL LOW (ref 36.0–46.0)
Hemoglobin: 10.3 g/dL — ABNORMAL LOW (ref 12.0–15.0)
Immature Granulocytes: 0 %
Lymphocytes Relative: 34 %
Lymphs Abs: 1.6 10*3/uL (ref 0.7–4.0)
MCH: 35 pg — ABNORMAL HIGH (ref 26.0–34.0)
MCHC: 33.4 g/dL (ref 30.0–36.0)
MCV: 104.8 fL — ABNORMAL HIGH (ref 80.0–100.0)
Monocytes Absolute: 0.9 10*3/uL (ref 0.1–1.0)
Monocytes Relative: 20 %
Neutro Abs: 1.9 10*3/uL (ref 1.7–7.7)
Neutrophils Relative %: 41 %
Platelets: 172 10*3/uL (ref 150–400)
RBC: 2.94 MIL/uL — ABNORMAL LOW (ref 3.87–5.11)
RDW: 16.9 % — ABNORMAL HIGH (ref 11.5–15.5)
WBC: 4.6 10*3/uL (ref 4.0–10.5)
nRBC: 0.4 % — ABNORMAL HIGH (ref 0.0–0.2)

## 2022-09-08 LAB — BASIC METABOLIC PANEL
Anion gap: 8 (ref 5–15)
BUN: 10 mg/dL (ref 8–23)
CO2: 25 mmol/L (ref 22–32)
Calcium: 8.2 mg/dL — ABNORMAL LOW (ref 8.9–10.3)
Chloride: 106 mmol/L (ref 98–111)
Creatinine, Ser: 0.61 mg/dL (ref 0.44–1.00)
GFR, Estimated: >60 mL/min (ref >60)
Glucose, Bld: 99 mg/dL (ref 70–99)
Potassium: 3.1 mmol/L — ABNORMAL LOW (ref 3.5–5.1)
Sodium: 139 mmol/L (ref 135–145)

## 2022-09-08 LAB — GLUCOSE, CAPILLARY
Glucose-Capillary: 104 mg/dL — ABNORMAL HIGH (ref 70–99)
Glucose-Capillary: 131 mg/dL — ABNORMAL HIGH (ref 70–99)
Glucose-Capillary: 104 mg/dL — ABNORMAL HIGH (ref 70–99)
Glucose-Capillary: 107 mg/dL — ABNORMAL HIGH (ref 70–99)

## 2022-09-08 LAB — MAGNESIUM: Magnesium: 2.2 mg/dL (ref 1.7–2.4)

## 2022-09-08 LAB — HEMOGLOBIN A1C
Hgb A1c MFr Bld: 6.4 % — ABNORMAL HIGH (ref 4.8–5.6)
Mean Plasma Glucose: 137 mg/dL

## 2022-09-08 MED ORDER — BOOST PLUS PO LIQD
237.0000 mL | Freq: Three times a day (TID) | ORAL | Status: DC
  Administered 2022-09-08: 237 mL via ORAL
  Filled 2022-09-08 (×3): qty 237

## 2022-09-08 MED ORDER — POTASSIUM CHLORIDE CRYS ER 20 MEQ PO TBCR
40.0000 meq | EXTENDED_RELEASE_TABLET | Freq: Once | ORAL | Status: AC
  Administered 2022-09-08: 40 meq via ORAL
  Filled 2022-09-08: qty 2

## 2022-09-08 MED ORDER — CHOLESTYRAMINE LIGHT 4 G PO PACK
4.0000 g | PACK | Freq: Every day | ORAL | Status: DC
  Administered 2022-09-09: 4 g via ORAL
  Filled 2022-09-08 (×2): qty 1

## 2022-09-08 MED ORDER — POTASSIUM CHLORIDE 10 MEQ/50ML IV SOLN
10.0000 meq | INTRAVENOUS | Status: AC
  Administered 2022-09-08 (×4): 10 meq via INTRAVENOUS
  Filled 2022-09-08 (×4): qty 50

## 2022-09-08 NOTE — Consult Note (Signed)
Ms. Marrone is well-known to me.  Is a very nice 76 year old [place] female.  She has metastatic colon cancer.  She was seen by me in the office just a week or so ago.  At that time, she, unfortunately, has been taking the Xeloda every day.  I am unsure as to why she was doing this every day.  When she was on her vacation over to [place], she did Xeloda 2 weeks on and 1 week off.  When I saw her, she is having diarrhea.  She is having a lot of pain in the hands and feet.  She was not eating all that well.  I told her to stop the Xeloda until I saw her back.  Unfortunately, she has had problems with diarrhea and abdominal pain.  She is having profuse diarrhea.  She is having more abdominal pain.  She subsequently came to emergency room on 09/05/2022.  She had a CT of the abdomen pelvis.  At that time, she did not have any obstruction.  She had the parastomal hernia.  There is no incarceration.  She did have the peritoneal nodules.  Everything looked relatively stable.  When she was admitted, her potassium was 3.  Her BUN was 16 creatinine 0.67.  Her white count is 5.3.  Hemoglobin 12.1.  Platelet count 210,000.  Patient has gotten supplemental potassium.  The diarrhea is gotten better.  Hopefully, she will be able to have something to eat.  Her goal is to get back to [place] and live.  She given plan to go back in January.  When we last saw her, her CEA was 5.6.  This is holding relatively steady.  She is having no bleeding.  She is having no cough or shortness of breath.  Thing that she has had radiosurgery for a lung nodule.  She has had no rashes.  There has been no fever.   Her vital signs show temperature 98.2.  Pulse 89.  Blood pressure 112/58.  Her oral exam does not show any mucositis.  There is no thrush.  Lungs are clear bilaterally.  Cardiac exam regular rate and rhythm with no murmurs, rubs or bruits.  Abdomen shows the colostomy.  She has a abdominal wound which looks like it is finally healed  up.  She has the hernia with the stoma.  She has slightly decreased bowel sounds.  There is no guarding or rebound tenderness.  Extremity shows no clubbing, cyanosis or edema.   Ms. Bessinger is a very nice 76 year old [place] female.  She has metastatic colon cancer.  We have been treating her for over a year.  She recently was on Xeloda.  She had done well with Xeloda.  Her CEA level to come down nicely.  She started taking the Xeloda daily.  Again I am unsure as to why she was doing it daily.  She is now off Xeloda.  I suspect that the diarrhea will improve being off the Xeloda.  She been off the Xeloda now probably for 5 or 6 days.  I would like to leave that the diarrhea will begin to slow up on her.  She has always had abdominal pain associated with the stoma.  Thankfully there is no incarceration or strangulation of the hernia.  Hopefully her diet can be increased a little bit.  Her potassium is on the low side.  I did give her some IV potassium.  She is not neutropenic which is favorable for her.  We will  continue to follow her along.  I know that she is getting incredible care from everybody on 4 E.  I appreciate everybody's help.  Lattie Haw, MD  2 Cor 9:15

## 2022-09-08 NOTE — Progress Notes (Signed)
[facility] Progress Note Patient: Bailey Walker ZOX:[id number] DOB: 1946-07-11 DOA: 09/05/2022  DOS: the patient was seen and examined on 09/08/2022  Brief hospital course: PMH of colon cancer with colostomy, GERD, type II DM, HLD, HTN, SVT, hypothyroidism presented to the hospital with complaints of abdominal pain along with nausea vomiting and diarrhea. Suspected to have gastroenteritis.  C. difficile negative.  GI pathogen panel negative. Assessment and Plan: Intractable nausea and vomiting.  Currently resolved. Suspected viral gastroenteritis. Diarrhea.  Improving Patient has changed her bags around 4 x 2 midday today. At home she changes 3-4 times throughout the day.   No blood in the stool reported. She reports that the diarrhea has been ongoing for last 7 days and then for last 3 days she is having abdominal pain which is located in the upper abdomen area. CT abdomen unremarkable for any acute abnormality. Lipase negative. Continue Bentyl.  Continue Imodium.  Continue Florastor.  Continue Questran. Will stop IV fluids as the patient is tolerating oral diet. Changed to regular diet. Add boost.    History of colon cancer SP distal left colectomy with left lower quadrant colostomy creation. Patient is supposed to be on Xeloda.  She may have taken Xarelto for 6 weeks continuously. Last dose of Xeloda was last 12/13. Plan is for radiosurgery for right lung nodule. Monitor.  Hypokalemia. Hypomagnesemia. Replaced.  Monitor.  Hypothyroidism. On Synthroid. Monitor.  Type 2 diabetes mellitus uncontrolled with hyperglycemia without long-term current use of insulin. Currently on sliding scale insulin. Hemoglobin A1c 6.4.  Recurrent nosebleeding. Afrin spray on a scheduled basis.  Monitor.  Obesity Body mass index is 30.61 kg/m.  Placing the pt at higher risk of poor outcomes.   Hypothyroidism. Patient does have history of hypothyroidism and is on  Synthroid. In the setting of diarrhea I will check TSH and free T4 to ensure she is not hyperthyroid.   Subjective: Abdominal pain and having.  Tolerating oral diet.  No nausea no vomiting.  Continues to have diarrhea with Karma Greaser changes so far throughout the day.  Physical Exam: General: Appear in mild distress; Cardiovascular: S1 and S2 Present, no Murmur, Respiratory: good respiratory effort, Bilateral Air entry present, CTA, no Crackles, no wheezes Abdomen: Bowel Sound present, mild lower abdominal tenderness Extremities: no Pedal edema Neurology: alert and oriented to time, place, and person   Data Reviewed: I have Reviewed nursing notes, Vitals, and Lab results. Since last encounter, pertinent lab results CBC and BMP   . I have ordered test including CBC BMP TSH free T4  .   Disposition: Status is: Inpatient Remains inpatient appropriate because: Monitor for any diarrhea.  Place and maintain sequential compression device Start: 09/07/22 1547   Family Communication: Discussed with niece on the phone who also helped to interpret. Level of care: Med-Surg switch to MedSurg. Vitals:   09/07/22 2020 09/08/22 0606 09/08/22 1043 09/08/22 1146  BP: (!) 141/60 (!) 112/58 (!) 114/95 (!) 150/68  Pulse: 93 89 (!) 109 95  Resp: '16 16  19  '$ Temp: 98.3 F (36.8 C) 98.2 F (36.8 C)  98.2 F (36.8 C)  TempSrc: Oral Oral  Oral  SpO2: 96% 97%  98%  Weight:      Height:         Author: Berle Mull, MD 09/08/2022 6:42 PM  Please look on [email or web address] to find out who is on call.

## 2022-09-09 ENCOUNTER — Ambulatory Visit: Payer: Medicaid Other | Admitting: Radiation Oncology

## 2022-09-09 DIAGNOSIS — C772 Secondary and unspecified malignant neoplasm of intra-abdominal lymph nodes: Secondary | ICD-10-CM

## 2022-09-09 DIAGNOSIS — Z933 Colostomy status: Secondary | ICD-10-CM

## 2022-09-09 DIAGNOSIS — C187 Malignant neoplasm of sigmoid colon: Secondary | ICD-10-CM | POA: Diagnosis not present

## 2022-09-09 LAB — GLUCOSE, CAPILLARY
Glucose-Capillary: 114 mg/dL — ABNORMAL HIGH (ref 70–99)
Glucose-Capillary: 101 mg/dL — ABNORMAL HIGH (ref 70–99)
Glucose-Capillary: 119 mg/dL — ABNORMAL HIGH (ref 70–99)

## 2022-09-09 LAB — COMPREHENSIVE METABOLIC PANEL
ALT: 23 U/L (ref 0–44)
AST: 23 U/L (ref 15–41)
Albumin: 3.3 g/dL — ABNORMAL LOW (ref 3.5–5.0)
Alkaline Phosphatase: 30 U/L — ABNORMAL LOW (ref 38–126)
Anion gap: 7 (ref 5–15)
BUN: 11 mg/dL (ref 8–23)
CO2: 25 mmol/L (ref 22–32)
Calcium: 8.3 mg/dL — ABNORMAL LOW (ref 8.9–10.3)
Chloride: 107 mmol/L (ref 98–111)
Creatinine, Ser: 0.42 mg/dL — ABNORMAL LOW (ref 0.44–1.00)
GFR, Estimated: >60 mL/min (ref >60)
Glucose, Bld: 104 mg/dL — ABNORMAL HIGH (ref 70–99)
Potassium: 3.5 mmol/L (ref 3.5–5.1)
Sodium: 139 mmol/L (ref 135–145)
Total Bilirubin: 0.9 mg/dL (ref 0.3–1.2)
Total Protein: 5.9 g/dL — ABNORMAL LOW (ref 6.5–8.1)

## 2022-09-09 LAB — CBC WITH DIFFERENTIAL/PLATELET
Abs Immature Granulocytes: 0.04 10*3/uL (ref 0.00–0.07)
Basophils Absolute: 0 10*3/uL (ref 0.0–0.1)
Basophils Relative: 0 %
Eosinophils Absolute: 0.3 10*3/uL (ref 0.0–0.5)
Eosinophils Relative: 5 %
HCT: 30.1 % — ABNORMAL LOW (ref 36.0–46.0)
Hemoglobin: 10 g/dL — ABNORMAL LOW (ref 12.0–15.0)
Immature Granulocytes: 1 %
Lymphocytes Relative: 25 %
Lymphs Abs: 1.4 10*3/uL (ref 0.7–4.0)
MCH: 35 pg — ABNORMAL HIGH (ref 26.0–34.0)
MCHC: 33.2 g/dL (ref 30.0–36.0)
MCV: 105.2 fL — ABNORMAL HIGH (ref 80.0–100.0)
Monocytes Absolute: 1 10*3/uL (ref 0.1–1.0)
Monocytes Relative: 18 %
Neutro Abs: 2.8 10*3/uL (ref 1.7–7.7)
Neutrophils Relative %: 51 %
Platelets: 171 10*3/uL (ref 150–400)
RBC: 2.86 MIL/uL — ABNORMAL LOW (ref 3.87–5.11)
RDW: 17 % — ABNORMAL HIGH (ref 11.5–15.5)
WBC: 5.5 10*3/uL (ref 4.0–10.5)
nRBC: 0 % (ref 0.0–0.2)

## 2022-09-09 LAB — TSH: TSH: 4.921 u[IU]/mL — ABNORMAL HIGH (ref 0.350–4.500)

## 2022-09-09 LAB — T4, FREE: Free T4: 0.84 ng/dL (ref 0.61–1.12)

## 2022-09-09 MED ORDER — GLUCERNA SHAKE PO LIQD
237.0000 mL | Freq: Two times a day (BID) | ORAL | Status: DC
  Filled 2022-09-09: qty 237

## 2022-09-09 MED ORDER — CAPECITABINE 500 MG PO TABS: ORAL_TABLET | ORAL | 1 refills | Status: DC

## 2022-09-09 MED ORDER — GLUCERNA SHAKE PO LIQD: 237.0000 mL | Freq: Two times a day (BID) | ORAL | 0 refills | Status: AC

## 2022-09-09 MED ORDER — CHOLESTYRAMINE LIGHT 4 G PO PACK: 4.0000 g | PACK | Freq: Every day | ORAL | 0 refills | Status: DC

## 2022-09-09 MED ORDER — SACCHAROMYCES BOULARDII 250 MG PO CAPS: 250.0000 mg | ORAL_CAPSULE | Freq: Two times a day (BID) | ORAL | 0 refills | Status: AC

## 2022-09-09 MED ORDER — HEPARIN SOD (PORK) LOCK FLUSH 100 UNIT/ML IV SOLN
500.0000 [IU] | INTRAVENOUS | Status: AC | PRN
  Administered 2022-09-09: 500 [IU]
  Filled 2022-09-09: qty 5

## 2022-09-09 MED ORDER — POTASSIUM CHLORIDE CRYS ER 20 MEQ PO TBCR: 20.0000 meq | EXTENDED_RELEASE_TABLET | Freq: Two times a day (BID) | ORAL | 0 refills | Status: DC

## 2022-09-09 MED ORDER — ONDANSETRON HCL 8 MG PO TABS: 8.0000 mg | ORAL_TABLET | Freq: Three times a day (TID) | ORAL | 0 refills | Status: AC | PRN

## 2022-09-09 MED ORDER — DICYCLOMINE HCL 10 MG PO CAPS: 10.0000 mg | ORAL_CAPSULE | Freq: Three times a day (TID) | ORAL | 0 refills | Status: DC | PRN

## 2022-09-09 MED ORDER — PANTOPRAZOLE SODIUM 40 MG PO TBEC: 40.0000 mg | DELAYED_RELEASE_TABLET | Freq: Every day | ORAL | 0 refills | Status: AC

## 2022-09-09 NOTE — Progress Notes (Addendum)
Discharge instructions were reviewed with the patients niece, [name]. Questions, concerns were denied at this time. No change from am assessment. Pt is ambulatory without assistance a&ox4. Port dressing CDI.

## 2022-09-09 NOTE — Progress Notes (Signed)
Bailey Walker looks little bit better.  She is now hurting as much.  I think what might be a good idea for her would be a Duragesic patch.  I think maybe a 12 mcg patch we might be able to help keep the pain under control.  She had a little bit more solid food yesterday.  I think she did okay with this.  She has had some diarrhea.  Again hopefully, this will improve as she gets further out from stopping the Xeloda.  She has had no fever.  She has had no bleeding.  She has had no vomiting.  Her labs show sodium 139.  Potassium is 3.5.  BUN 11 creatinine 0.42.  Her albumin is 3.3.  White cell count is 5.5.  Hemoglobin 10.  The platelet count is 171,000.  Her vital signs are temperature of 98.8.  Pulse 101.  Blood pressure 146/67.  Her head neck exam shows no ocular or oral lesions.  She has no palpable cervical or supraclavicular lymph nodes.  Lungs are clear bilaterally.  Cardiac exam is tachycardic but regular.  Abdomen is soft.  She has the ostomy in the left lower quadrant.  She has the hernia associated with the ostomy.  She has a laparotomy wound which appears to be all healed.  It is healing by secondary intention.  She has decent bowel sounds.  There is no fluid wave.  There is no palpable liver or spleen tip.  Extremity shows no clubbing, cyanosis or edema.  Neurological exam is nonfocal.  From what her niece says, Bailey Walker might go home today.  I so I would have no problems with this.  Again I think is all about what she can eat and having any diarrhea.  I know that she has gotten great care.  We will continue to follow along as long as she is in the hospital.   Lattie Haw, MD  [place] [place] 4:4

## 2022-09-09 NOTE — TOC Transition Note (Signed)
Transition of Care [facility]) - CM/SW Discharge Note   Patient Details  Name: Bailey Walker MRN: [id number] Date of Birth: 1946/01/03  [facility] CM/SW Contact:  Leeroy Cha, RN Phone Number: 09/09/2022, 9:24 AM   Clinical Narrative:    Patient discharged to return home with self care.   Final next level of care: Home/Self Care Barriers to Discharge: Barriers Resolved   Patient Goals and CMS Choice        Discharge Placement                       Discharge Plan and Services                                     Social Determinants of Health (SDOH) Interventions     Readmission Risk Interventions   Row Labels 05/02/2020    1:37 PM 04/25/2020   10:41 AM  Readmission Risk Prevention Plan   Section Header. No data exists in this row.    Transportation Screening    Complete  PCP or Specialist Appt within 3-5 Days    Not Complete  Not Complete comments    pending disposition  [facility]RI or [place]    Complete  Social Work Consult for [place] Planning/Counseling    Complete  Palliative Care Screening    Not Applicable  Medication Review [employer] photographer)    Referral to Pharmacy  [place] or [place]   Complete   SW Recovery Care/Counseling Consult   Complete   Palliative Care Screening   Not [place]   Not Applicable

## 2022-09-09 NOTE — Discharge Summary (Signed)
Physician Discharge Summary   Patient: Bailey Walker MRN: [id number] DOB: 05/17/1946  Admit date:     09/05/2022  Discharge date: 09/09/2022  Discharge Physician: Berle Mull  PCP: [name], MD  Recommendations at discharge: Follow-up with oncology as recommended.  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 Suspected to have gastroenteritis.  C. difficile negative.  GI pathogen panel negative. Assessment and Plan  Intractable nausea and vomiting. Currently resolved. Suspected viral gastroenteritis. Diarrhea.  Improving At home she changes 3-4 times throughout the day.  Currently changing the backs multiple times in the hospital. No blood in the stool reported. She reports that the diarrhea has been ongoing for last 7 days and then for last 3 days she is having abdominal pain which is located in the upper abdomen area. CT abdomen unremarkable for any acute abnormality. Lipase negative. Continue Bentyl.  Continue Imodium.  Continue Florastor.  Continue Questran. Tolerating regular diet.  Eating less with the concern that it might cause diarrhea.   History of colon cancer SP distal left colectomy with left lower quadrant colostomy creation. Patient is supposed to be on Xeloda.  She may have taken Xarelto for 6 weeks continuously. Last dose of Xeloda was last 12/13. Plan is for radiosurgery for right lung nodule. Monitor.   Hypokalemia. Hypomagnesemia. Replaced.  Monitor. Despite given multiple diarrhea reported at, no evidence of significant dehydration or BUN elevation    Hypothyroidism. On Synthroid.  TSH and free T4 normal. Monitor.   Type 2 diabetes mellitus uncontrolled with hyperglycemia without long-term current use of insulin. Continue home treatment.  Hold metformin as it might cause diarrhea.  Especially when the patient is going to eat less due to her abdominal pain. Hemoglobin A1c 6.4.   Recurrent nosebleeding. Afrin spray on a scheduled basis.  Monitor.   Obesity Body mass index is 30.61 kg/m.  Placing the pt at higher risk of poor outcomes.    Hypothyroidism. Patient does have history of hypothyroidism and is on Synthroid. In the setting of diarrhea I will check TSH and free T4 to ensure she is not hyperthyroid.   Consultants:  Oncology  Procedures performed:  none  DISCHARGE MEDICATION: Allergies as of 09/09/2022       Reactions   Nitroglycerin Nausea And Vomiting   Ampicillin Other (See Comments)   Per allergy test        Medication List     STOP taking these medications    dexamethasone 4 MG tablet Commonly known as: DECADRON   metFORMIN 500 MG tablet Commonly known as: GLUCOPHAGE       TAKE these medications    acetaminophen 500 MG tablet Commonly known as: TYLENOL Take 500 mg by mouth every 6 (six) hours as needed for moderate pain.   alendronate 70 MG tablet Commonly known as: FOSAMAX Take 70 mg by mouth every Sunday.   aspirin EC 81 MG tablet Take 81 mg by mouth daily. Swallow whole.   atorvastatin 40 MG tablet Commonly known as: LIPITOR Take 1 tablet by mouth daily.   capecitabine 500 MG tablet Commonly known as: Xeloda Take as instructed by  Dr Marin Olp Start taking on: September 17, 2022 What changed:  how much to take how to take this when to take this additional instructions These instructions start on September 17, 2022. If you are unsure what to do until then, ask your doctor or other care provider.   cholestyramine light 4 g packet Commonly known as: PREVALITE Take 1 packet (4 g  total) by mouth daily at 12 noon for 3 days.   diclofenac Sodium 1 % Gel Commonly known as: VOLTAREN Apply 4 g topically 4 (four) times daily. What changed:  when to take this reasons to take this   dicyclomine 10 MG capsule Commonly known as: BENTYL Take 1 capsule (10 mg total) by mouth 3 (three) times daily as needed for spasms.   feeding supplement (GLUCERNA SHAKE) Liqd Take 237 mLs by mouth 2 (two) times daily between meals. What changed: when to take this   glipiZIDE 5 MG tablet Commonly known as: GLUCOTROL TAKE 1 TABLET BY MOUTH TWICE A DAY BEFORE MEALS   HYDROcodone-acetaminophen 5-325 MG tablet Commonly known as: NORCO/VICODIN Take 1 tablet by mouth every 6 (six) hours as needed for moderate pain.   levothyroxine 75 MCG tablet Commonly known as: SYNTHROID TAKE 1 TABLET BY MOUTH EVERY DAY   lidocaine-prilocaine cream Commonly known as: EMLA Apply 1 Application topically as needed. What changed: reasons to take this   loperamide 2 MG capsule Commonly known as: IMODIUM TAKE 2 CAPSULES (4 MG TOTAL) BY MOUTH AS NEEDED. TAKE 2 AT DIARRHEA ONSET , THEN 1 EVERY 2HR UNTIL 12HRS WITH NO BM. MAY TAKE 2 EVERY 4HRS AT NIGHT. IF DIARRHEA RECURS REPEAT.   methocarbamol 500 MG tablet Commonly known as: ROBAXIN TAKE 1 TABLET BY MOUTH EVERY 12 HOURS AS NEEDED FOR MUSCLE SPASMS.   metoprolol tartrate 25 MG tablet Commonly known as: LOPRESSOR Take 1 tablet (25 mg total) by mouth 2 (two) times daily.   multivitamin with minerals Tabs tablet Take 1 tablet by mouth daily.   ondansetron 8 MG tablet Commonly known as: ZOFRAN Take 1 tablet (8 mg total) by mouth every 8 (eight) hours as needed for vomiting or nausea. Start on day 3 after chemotherapy. What changed:  when to take this reasons to take this   pantoprazole 40 MG tablet Commonly known as: PROTONIX Take 1 tablet (40 mg total) by mouth daily for 14 days. What changed:  medication strength how much to take when to  take this   potassium chloride SA 20 MEQ tablet Commonly known as: Klor-Con M20 Take 1 tablet (20 mEq total) by mouth 2 (two) times daily. What changed: how much to take   prednisoLONE acetate 1 % ophthalmic suspension Commonly known as: PRED FORTE Place 1 drop into both eyes every Monday.   prochlorperazine 10 MG tablet Commonly known as: COMPAZINE Take 1 tablet (10 mg total) by mouth every 6 (six) hours as needed (NAUSEA).   saccharomyces boulardii 250 MG capsule Commonly known as: FLORASTOR Take 1 capsule (250 mg total) by mouth 2 (two) times daily.   silver sulfADIAZINE 1 % cream Commonly known as: Silvadene Apply 1 application topically daily.   traMADol 50 MG tablet Commonly known as: ULTRAM Take 50 mg by mouth every 6 (six) hours as needed for moderate pain or severe pain.       Disposition: Home Diet recommendation: Regular diet  Discharge Exam: Vitals:   09/08/22 2235 09/09/22 0516 09/09/22 0959 09/09/22 1356  BP: 132/63 (!) 130/59 (!) 146/67 110/75  Pulse: 93 (!) 102 (!) 101 90  Resp: '16 16 16 20  '$ Temp: 98.1 F (36.7 C) 98.8 F (37.1 C)  98 F (36.7 C)  TempSrc: Oral Oral  Oral  SpO2: 99% 96% 97% 98%  Weight:      Height:       General: Appear in mild distress; no visible Abnormal Neck Mass Or lumps, Conjunctiva normal Cardiovascular: S1 and S2 Present, no Murmur, Respiratory: good respiratory effort, Bilateral Air entry present and CTA, no Crackles, no wheezes Abdomen: Bowel Sound present, mild upper abdomen tenderness Extremities: no Pedal edema Neurology: alert and oriented to time, place, and person  Filed Weights   09/05/22 1951 09/06/22 2200  Weight: 72.1 kg 71.1 kg   Condition at discharge: stable  The results of significant diagnostics from this hospitalization (including imaging, microbiology, ancillary and laboratory) are listed below for reference.   Imaging Studies: DG Chest Port 1 View  Result Date: 09/05/2022 CLINICAL DATA:   Lower abdominal pain and vomiting EXAM: PORTABLE CHEST 1 VIEW COMPARISON:  CT 08/06/2022 FINDINGS: Stable cardiomediastinal silhouette. Accessed right chest wall Port-A-Cath with tip at the superior cavoatrial junction. Left basilar atelectasis. No focal consolidation, pleural effusion, or pneumothorax. No acute osseous abnormality. IMPRESSION: No active disease. Electronically Signed   By: Placido Sou M.D.   On: 09/05/2022 23:30   CT ABDOMEN PELVIS WO CONTRAST  Addendum Date: 09/05/2022   ADDENDUM REPORT: 09/05/2022 22:50 ADDENDUM: Additional impression grossly stable poorly defined left adnexal mass. Electronically Signed   By: Donavan Foil M.D.   On: 09/05/2022 22:50   Result Date: 09/05/2022 CLINICAL DATA:  History of colon cancer colectomy nausea vomiting EXAM: CT ABDOMEN AND PELVIS WITHOUT CONTRAST TECHNIQUE: Multidetector CT imaging of the abdomen and pelvis was performed following the standard protocol without IV contrast. RADIATION DOSE REDUCTION: This exam was performed according to the departmental dose-optimization program which includes automated exposure control, adjustment of the mA and/or kV according to patient size and/or use of iterative reconstruction technique. COMPARISON:  CT 08/06/2022, 04/05/2022, PET CT 06/24/2021, CT 04/16/2020 FINDINGS: Lower chest: Lung bases demonstrate no acute airspace disease 6 mm left lung base pulmonary nodule series 4, image 18, stable as compared with CT from November, slightly increased when compared to the CT examinations from July in April. The previously described nodule at the anterior right lung base on the most recent prior is regressed with minimal ground-glass density in the region, series 4, image 12. No acute airspace disease. Hepatobiliary: No calcified gallstone. Small liver granuloma. No biliary dilatation Pancreas: Fatty atrophy.  No inflammation Spleen: Normal in size without focal abnormality. Adrenals/Urinary Tract: Adrenal glands  are unremarkable. Kidneys are normal, without renal calculi, focal lesion, or hydronephrosis. Bladder is unremarkable. Stomach/Bowel: The stomach is nonenlarged. No dilated small bowel. Status post distal left colectomy with left abdominal colostomy. 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 No evidence for acute bowel wall thickening. Vascular/Lymphatic: Moderate aortic atherosclerosis. No aneurysm. No increasing lymph nodes. Reproductive: Poorly defined left adnexal mass measuring about 5.7 by 3.4 cm, previously 5.7 x 3.2 cm. This is grossly unchanged. Other: Negative for free air or pelvic effusion. Multiple peritoneal nodules are again evident. Right lower quadrant anterior peritoneal nodule measures 2.3 by 1.5 cm on series 2, image 46, previously 2.3 x 1.5 cm when measured in a similar fashion. Anterior pelvic peritoneal nodule measures 1.2 by 1.2 cm, previously 1.2 by 0.9 cm. Additional peritoneal nodules and nodularity  without gross interval change. Slight strandy appearance the mesentery in the pelvis, for example series 2, image 57, and within the left lower quadrant peristomal hernia. Musculoskeletal: No acute osseous abnormality. Multilevel degenerative changes. IMPRESSION: 1. No CT evidence for acute intra-abdominal or pelvic abnormality. No evidence for bowel obstruction or bowel wall thickening. 2. Status post distal left colectomy with left lower quadrant colostomy. 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 3. Multiple peritoneal nodules consistent with metastatic disease, grossly stable as compared with most recent prior. Mild hazy thickening of the mesentery, attention on follow-up imaging to exclude disease progression. 4. Stable 6 mm left lung base pulmonary metastatic nodule. The previously described right lung base pulmonary nodule is progressed compared to  the recent CT. 5. Aortic atherosclerosis. Aortic Atherosclerosis (ICD10-I70.0). Electronically Signed: By: Donavan Foil M.D. On: 09/05/2022 22:41    Microbiology: Results for orders placed or performed during the hospital encounter of 09/05/22  Gastrointestinal Panel by PCR , Stool     Status: None   Collection Time: 09/05/22 10:57 PM   Specimen: Stool  Result Value Ref Range Status   Campylobacter species NOT DETECTED NOT DETECTED Final   Plesimonas shigelloides NOT DETECTED NOT DETECTED Final   Salmonella species NOT DETECTED NOT DETECTED Final   Yersinia enterocolitica NOT DETECTED NOT DETECTED Final   Vibrio species NOT DETECTED NOT DETECTED Final   Vibrio cholerae NOT DETECTED NOT DETECTED Final   Enteroaggregative E coli (EAEC) NOT DETECTED NOT DETECTED Final   Enteropathogenic E coli (EPEC) NOT DETECTED NOT DETECTED Final   Enterotoxigenic E coli (ETEC) NOT DETECTED NOT DETECTED Final   Shiga like toxin producing E coli (STEC) NOT DETECTED NOT DETECTED Final   Shigella/Enteroinvasive E coli (EIEC) NOT DETECTED NOT DETECTED Final   Cryptosporidium NOT DETECTED NOT DETECTED Final   Cyclospora cayetanensis NOT DETECTED NOT DETECTED Final   Entamoeba histolytica NOT DETECTED NOT DETECTED Final   Giardia lamblia NOT DETECTED NOT DETECTED Final   Adenovirus F40/41 NOT DETECTED NOT DETECTED Final   Astrovirus NOT DETECTED NOT DETECTED Final   Norovirus GI/GII NOT DETECTED NOT DETECTED Final   Rotavirus A NOT DETECTED NOT DETECTED Final   Sapovirus (I, II, IV, and V) NOT DETECTED NOT DETECTED Final    Comment: Performed at [facility], [address] [place], [place] [address]  C Difficile Quick Screen w PCR reflex     Status: None   Collection Time: 09/05/22 10:57 PM   Specimen: Stool  Result Value Ref Range Status   C Diff antigen NEGATIVE NEGATIVE Final   C Diff toxin NEGATIVE NEGATIVE Final   C Diff interpretation No C. difficile detected.  Final    Comment:  Performed at [facility], [place] [address] [place], [place] [address]  Resp panel by RT-PCR (RSV, Flu A&B, Covid) Anterior Nasal Swab     Status: None   Collection Time: 09/05/22 11:42 PM   Specimen: Anterior Nasal Swab  Result Value Ref Range Status   SARS Coronavirus 2 by RT PCR NEGATIVE NEGATIVE Final    Comment: (NOTE) SARS-CoV-2 target nucleic acids are NOT DETECTED.  The SARS-CoV-2 RNA is generally detectable in upper respiratory specimens during the acute phase of infection. The lowest concentration of SARS-CoV-2 viral copies this assay can detect is 138 copies/mL. A negative result does not preclude SARS-Cov-2 infection and should not be used as the sole basis for treatment or other  patient management decisions. A negative result may occur with  improper specimen collection/handling, submission of specimen other than nasopharyngeal swab, presence of viral mutation(s) within the areas targeted by this assay, and inadequate number of viral copies(<138 copies/mL). A negative result must be combined with clinical observations, patient history, and epidemiological information. The expected result is Negative.  Fact Sheet for Patients:  EntrepreneurPulse.com.au  Fact Sheet for Healthcare Providers:  IncredibleEmployment.be  This test is no t yet approved or cleared by the [place] FDA and  has been authorized for detection and/or diagnosis of SARS-CoV-2 by FDA under an Emergency Use Authorization (EUA). This EUA will remain  in effect (meaning this test can be used) for the duration of the COVID-19 declaration under Section 564(b)(1) of the Act, 21 U.S.C.section 360bbb-3(b)(1), unless the authorization is terminated  or revoked sooner.       Influenza A by PCR NEGATIVE NEGATIVE Final   Influenza B by PCR NEGATIVE NEGATIVE Final    Comment: (NOTE) The Xpert Xpress SARS-CoV-2/FLU/RSV plus assay is intended as an  aid in the diagnosis of influenza from Nasopharyngeal swab specimens and should not be used as a sole basis for treatment. Nasal washings and aspirates are unacceptable for Xpert Xpress SARS-CoV-2/FLU/RSV testing.  Fact Sheet for Patients: EntrepreneurPulse.com.au  Fact Sheet for Healthcare Providers: IncredibleEmployment.be  This test is not yet approved or cleared by the [place] FDA and has been authorized for detection and/or diagnosis of SARS-CoV-2 by FDA under an Emergency Use Authorization (EUA). This EUA will remain in effect (meaning this test can be used) for the duration of the COVID-19 declaration under Section 564(b)(1) of the Act, 21 U.S.C. section 360bbb-3(b)(1), unless the authorization is terminated or revoked.     Resp Syncytial Virus by PCR NEGATIVE NEGATIVE Final    Comment: (NOTE) Fact Sheet for Patients: EntrepreneurPulse.com.au  Fact Sheet for Healthcare Providers: IncredibleEmployment.be  This test is not yet approved or cleared by the [place] FDA and has been authorized for detection and/or diagnosis of SARS-CoV-2 by FDA under an Emergency Use Authorization (EUA). This EUA will remain in effect (meaning this test can be used) for the duration of the COVID-19 declaration under Section 564(b)(1) of the Act, 21 U.S.C. section 360bbb-3(b)(1), unless the authorization is terminated or revoked.  Performed at [facility], [place] [address] [place], [place] [address]    Labs: CBC: Recent Labs  Lab 09/03/22 1525 09/05/22 2135 09/06/22 0618 09/08/22 0250 09/09/22 0437  WBC 7.2 5.3 4.0 4.6 5.5  NEUTROABS 4.6 2.8  --  1.9 2.8  HGB 11.0* 12.1 10.2* 10.3* 10.0*  HCT 32.4* 36.1 30.4* 30.8* 30.1*  MCV 101.3* 102.0* 102.7* 104.8* 105.2*  PLT 187 210 162 172 768   Basic Metabolic Panel: Recent Labs  Lab 09/03/22 1525 09/05/22 2135 09/06/22 0618  09/08/22 0250 09/09/22 0437  NA 138 137 138 139 139  K 3.4* 3.0* 2.5* 3.1* 3.5  CL 103 108 107 106 107  CO2 25 19* '22 25 25  '$ GLUCOSE 136* 163* 139* 99 104*  BUN '15 16 13 10 11  '$ CREATININE 0.56 0.67 0.58 0.61 0.42*  CALCIUM 8.9 8.7* 8.5* 8.2* 8.3*  MG  --   --  1.4* 2.2  --   PHOS  --   --  3.3  --   --    Liver Function Tests: Recent Labs  Lab 09/03/22 1525 09/05/22 2135 09/06/22 0618 09/09/22 0437  AST '24 22 17 23  '$ ALT 21 21  17 23  ALKPHOS 36* 32* 28* 30*  BILITOT 0.6 1.2 1.0 0.9  PROT 7.3 6.9 6.3* 5.9*  ALBUMIN 4.4 4.0 3.5 3.3*   CBG: Recent Labs  Lab 09/08/22 1639 09/08/22 2232 09/09/22 0723 09/09/22 1135 09/09/22 1652  GLUCAP 104* 107* 114* 101* 119*    Discharge time spent: greater than 30 minutes.  Signed: Berle Mull, MD [facility] 09/09/2022

## 2022-09-11 ENCOUNTER — Ambulatory Visit: Payer: Medicaid Other | Admitting: Radiation Oncology

## 2022-09-16 ENCOUNTER — Ambulatory Visit: Payer: Medicaid Other

## 2022-09-16 ENCOUNTER — Ambulatory Visit: Payer: Medicaid Other | Admitting: Radiation Oncology

## 2022-09-17 ENCOUNTER — Ambulatory Visit
Admission: RE | Admit: 2022-09-17 | Discharge: 2022-09-17 | Disposition: A | Discharge status: Home / Self Care | Payer: Medicaid Other | Source: Ambulatory Visit | Attending: Radiation Oncology | Admitting: Radiation Oncology

## 2022-09-17 ENCOUNTER — Other Ambulatory Visit: Payer: Self-pay

## 2022-09-17 DIAGNOSIS — C772 Secondary and unspecified malignant neoplasm of intra-abdominal lymph nodes: Secondary | ICD-10-CM | POA: Diagnosis not present

## 2022-09-17 DIAGNOSIS — R911 Solitary pulmonary nodule: Secondary | ICD-10-CM | POA: Diagnosis not present

## 2022-09-17 DIAGNOSIS — C187 Malignant neoplasm of sigmoid colon: Secondary | ICD-10-CM | POA: Diagnosis not present

## 2022-09-17 DIAGNOSIS — Z510 Encounter for antineoplastic radiation therapy: Secondary | ICD-10-CM | POA: Diagnosis not present

## 2022-09-17 LAB — RAD ONC ARIA SESSION SUMMARY
Course Elapsed Days: 0
Plan Fractions Treated to Date: 1
Plan Prescribed Dose Per Fraction: 18 Gy
Plan Total Fractions Prescribed: 3
Plan Total Prescribed Dose: 54 Gy
Reference Point Dosage Given to Date: 18 Gy
Reference Point Session Dosage Given: 18 Gy
Session Number: 1

## 2022-09-18 ENCOUNTER — Ambulatory Visit: Payer: Medicaid Other | Admitting: Radiation Oncology

## 2022-09-19 ENCOUNTER — Ambulatory Visit: Payer: Medicaid Other | Admitting: Radiation Oncology

## 2022-09-23 ENCOUNTER — Ambulatory Visit: Payer: Medicaid Other | Admitting: Radiation Oncology

## 2022-09-23 ENCOUNTER — Ambulatory Visit: Payer: Medicaid Other

## 2022-09-24 ENCOUNTER — Ambulatory Visit
Admission: RE | Admit: 2022-09-24 | Discharge: 2022-09-24 | Disposition: A | Discharge status: Home / Self Care | Payer: Medicaid Other | Source: Ambulatory Visit | Attending: Radiation Oncology | Admitting: Radiation Oncology

## 2022-09-24 ENCOUNTER — Other Ambulatory Visit: Payer: Self-pay

## 2022-09-24 DIAGNOSIS — C772 Secondary and unspecified malignant neoplasm of intra-abdominal lymph nodes: Secondary | ICD-10-CM | POA: Diagnosis present

## 2022-09-24 DIAGNOSIS — C187 Malignant neoplasm of sigmoid colon: Secondary | ICD-10-CM | POA: Diagnosis not present

## 2022-09-24 LAB — RAD ONC ARIA SESSION SUMMARY
Course Elapsed Days: 7
Plan Fractions Treated to Date: 2
Plan Prescribed Dose Per Fraction: 18 Gy
Plan Total Fractions Prescribed: 3
Plan Total Prescribed Dose: 54 Gy
Reference Point Dosage Given to Date: 36 Gy
Reference Point Session Dosage Given: 18 Gy
Session Number: 2

## 2022-09-25 ENCOUNTER — Ambulatory Visit: Payer: Medicaid Other | Admitting: Radiation Oncology

## 2022-09-25 ENCOUNTER — Other Ambulatory Visit (HOSPITAL_COMMUNITY): Payer: Self-pay

## 2022-09-26 ENCOUNTER — Ambulatory Visit
Admission: RE | Admit: 2022-09-26 | Discharge: 2022-09-26 | Disposition: A | Discharge status: Home / Self Care | Payer: Medicaid Other | Source: Ambulatory Visit | Attending: Radiation Oncology | Admitting: Radiation Oncology

## 2022-09-26 ENCOUNTER — Other Ambulatory Visit: Payer: Self-pay

## 2022-09-26 DIAGNOSIS — C187 Malignant neoplasm of sigmoid colon: Secondary | ICD-10-CM | POA: Diagnosis not present

## 2022-09-26 LAB — RAD ONC ARIA SESSION SUMMARY
Course Elapsed Days: 9
Plan Fractions Treated to Date: 3
Plan Prescribed Dose Per Fraction: 18 Gy
Plan Total Fractions Prescribed: 3
Plan Total Prescribed Dose: 54 Gy
Reference Point Dosage Given to Date: 54 Gy
Reference Point Session Dosage Given: 18 Gy
Session Number: 3

## 2022-09-29 ENCOUNTER — Inpatient Hospital Stay: Payer: Medicaid Other

## 2022-09-29 ENCOUNTER — Inpatient Hospital Stay: Payer: Medicaid Other | Admitting: Hematology & Oncology

## 2022-09-29 ENCOUNTER — Other Ambulatory Visit: Payer: Self-pay

## 2022-09-30 NOTE — Radiation Completion Notes (Signed)
Patient Name: Bailey Walker, Bailey Walker MRN: [id number] Date of Birth: 27-Feb-1946 Referring Physician: Burney Gauze, M.D. Date of Service: 2022-09-30 Radiation Oncologist: Teryl Lucy, M.D. [place]                             Radiation Oncology End of Treatment Note     Diagnosis: R91.1 Solitary pulmonary nodule Intent: Curative     ==========DELIVERED PLANS==========  First Treatment Date: 2022-09-17 - Last Treatment Date: 2022-09-26   Plan Name: Lung_R_SBRT Site: Lung, Right Technique: SBRT/SRT-IMRT Mode: Photon Dose Per Fraction: 18 Gy Prescribed Dose (Delivered / Prescribed): 54 Gy / 54 Gy Prescribed Fxs (Delivered / Prescribed): 3 / 3     ==========ON TREATMENT VISIT DATES========== 2022-09-17, 2022-09-24, 2022-09-24, 2022-09-26     ==========UPCOMING VISITS==========       ==========APPENDIX - ON TREATMENT VISIT NOTES==========   PatEd 2022-05-19 Ongoing education performed.   ImpPlan 2022-05-19 The patient is tolerating radiation. Continue treatment as planned.   PhysExam 2022-05-19 Alert, no acute distress.   ProgNote 2022-05-19 Changes from last week/visit? [ no ] Pain? [ Yes - from her ostomy site - has an apt today with ostomy clinic. ] Fatigue? [ Yes ] Skin irritation? [ No ] Shortness of breath? Cough? [ Yes - has occasional shortness of breath and cough ] Pain or diff. swallowing? [ No ] Getting chemo/immunotherapy? Last tx: [ No - will start Xeloda 06/09/22. ] Need refills: [ No ] Additional  Weekly Progress Notes [  ]    PatEd 2022-09-23 Ongoing education performed.   ImpPlan 2022-09-23 The patient is tolerating radiation. Continue treatment as planned.   PhysExam 2022-09-23 Alert, no acute distress.   PatEd 2022-09-24 Ongoing education performed.   ImpPlan 2022-09-24 The patient is tolerating radiation. Continue treatment as planned.   PhysExam 2022-09-24 Alert, no acute distress.    ProgNote 2022-09-24 Need refills: [  ] Additional  Weekly Progress Notes [ Patient assessed by Dr. Sondra Come in the [place] area 09/24/22.  ]

## 2022-10-01 ENCOUNTER — Other Ambulatory Visit: Payer: Self-pay

## 2022-10-01 ENCOUNTER — Other Ambulatory Visit: Payer: Self-pay | Admitting: Hematology & Oncology

## 2022-10-01 ENCOUNTER — Other Ambulatory Visit (HOSPITAL_COMMUNITY): Payer: Self-pay

## 2022-10-01 DIAGNOSIS — C772 Secondary and unspecified malignant neoplasm of intra-abdominal lymph nodes: Secondary | ICD-10-CM

## 2022-10-01 MED ORDER — CAPECITABINE 500 MG PO TABS
2000.0000 mg | ORAL_TABLET | Freq: Two times a day (BID) | ORAL | 1 refills | Status: DC
  Filled 2022-10-01: qty 224, 28d supply, fill #0
  Filled 2022-10-29: qty 224, 28d supply, fill #1

## 2022-10-06 ENCOUNTER — Other Ambulatory Visit: Payer: Self-pay

## 2022-10-10 ENCOUNTER — Inpatient Hospital Stay: Payer: Medicaid Other

## 2022-10-10 ENCOUNTER — Inpatient Hospital Stay: Payer: Medicaid Other | Admitting: Hematology & Oncology

## 2022-10-10 ENCOUNTER — Inpatient Hospital Stay: Payer: Medicaid Other | Attending: Radiation Oncology

## 2022-10-10 ENCOUNTER — Other Ambulatory Visit: Payer: Medicaid Other

## 2022-10-10 ENCOUNTER — Ambulatory Visit: Payer: Medicaid Other | Admitting: Hematology & Oncology

## 2022-10-10 ENCOUNTER — Encounter: Payer: Self-pay | Admitting: Hematology & Oncology

## 2022-10-10 VITALS — BP 134/82 | HR 97 | Temp 98.3°F | Resp 20 | Ht 59.0 in | Wt 158.1 lb

## 2022-10-10 DIAGNOSIS — C772 Secondary and unspecified malignant neoplasm of intra-abdominal lymph nodes: Secondary | ICD-10-CM

## 2022-10-10 DIAGNOSIS — C187 Malignant neoplasm of sigmoid colon: Secondary | ICD-10-CM | POA: Diagnosis not present

## 2022-10-10 DIAGNOSIS — D510 Vitamin B12 deficiency anemia due to intrinsic factor deficiency: Secondary | ICD-10-CM | POA: Diagnosis not present

## 2022-10-10 DIAGNOSIS — Z95828 Presence of other vascular implants and grafts: Secondary | ICD-10-CM

## 2022-10-10 LAB — CMP (CANCER CENTER ONLY)
ALT: 12 U/L (ref 0–44)
AST: 15 U/L (ref 15–41)
Albumin: 4 g/dL (ref 3.5–5.0)
Alkaline Phosphatase: 42 U/L (ref 38–126)
Anion gap: 12 (ref 5–15)
BUN: 14 mg/dL (ref 8–23)
CO2: 26 mmol/L (ref 22–32)
Calcium: 9.5 mg/dL (ref 8.9–10.3)
Chloride: 100 mmol/L (ref 98–111)
Creatinine: 0.57 mg/dL (ref 0.44–1.00)
GFR, Estimated: >60 mL/min (ref >60)
Glucose, Bld: 137 mg/dL — ABNORMAL HIGH (ref 70–99)
Potassium: 2.9 mmol/L — ABNORMAL LOW (ref 3.5–5.1)
Sodium: 138 mmol/L (ref 135–145)
Total Bilirubin: 0.9 mg/dL (ref 0.3–1.2)
Total Protein: 7.1 g/dL (ref 6.5–8.1)

## 2022-10-10 LAB — CBC WITH DIFFERENTIAL (CANCER CENTER ONLY)
Abs Immature Granulocytes: 0.03 10*3/uL (ref 0.00–0.07)
Basophils Absolute: 0 10*3/uL (ref 0.0–0.1)
Basophils Relative: 1 %
Eosinophils Absolute: 0.3 10*3/uL (ref 0.0–0.5)
Eosinophils Relative: 4 %
HCT: 32.1 % — ABNORMAL LOW (ref 36.0–46.0)
Hemoglobin: 11.1 g/dL — ABNORMAL LOW (ref 12.0–15.0)
Immature Granulocytes: 0 %
Lymphocytes Relative: 13 %
Lymphs Abs: 1 10*3/uL (ref 0.7–4.0)
MCH: 35.9 pg — ABNORMAL HIGH (ref 26.0–34.0)
MCHC: 34.6 g/dL (ref 30.0–36.0)
MCV: 103.9 fL — ABNORMAL HIGH (ref 80.0–100.0)
Monocytes Absolute: 1 10*3/uL (ref 0.1–1.0)
Monocytes Relative: 13 %
Neutro Abs: 5 10*3/uL (ref 1.7–7.7)
Neutrophils Relative %: 69 %
Platelet Count: 182 10*3/uL (ref 150–400)
RBC: 3.09 MIL/uL — ABNORMAL LOW (ref 3.87–5.11)
RDW: 16.2 % — ABNORMAL HIGH (ref 11.5–15.5)
WBC Count: 7.3 10*3/uL (ref 4.0–10.5)
nRBC: 0 % (ref 0.0–0.2)

## 2022-10-10 LAB — FERRITIN: Ferritin: 267 ng/mL (ref 11–307)

## 2022-10-10 LAB — IRON AND IRON BINDING CAPACITY (CC-WL,HP ONLY)
Iron: 146 ug/dL (ref 28–170)
Saturation Ratios: 45 % — ABNORMAL HIGH (ref 10.4–31.8)
TIBC: 328 ug/dL (ref 250–450)
UIBC: 182 ug/dL (ref 148–442)

## 2022-10-10 LAB — CEA (IN HOUSE-CHCC): CEA (CHCC-In House): 9.36 ng/mL — ABNORMAL HIGH (ref 0.00–5.00)

## 2022-10-10 LAB — LACTATE DEHYDROGENASE: LDH: 173 U/L (ref 98–192)

## 2022-10-10 MED ORDER — SODIUM CHLORIDE 0.9% FLUSH
10.0000 mL | Freq: Once | INTRAVENOUS | Status: AC
  Administered 2022-10-10: 10 mL via INTRAVENOUS

## 2022-10-10 MED ORDER — HEPARIN SOD (PORK) LOCK FLUSH 100 UNIT/ML IV SOLN
500.0000 [IU] | Freq: Once | INTRAVENOUS | Status: AC
  Administered 2022-10-10: 500 [IU] via INTRAVENOUS

## 2022-10-10 MED ORDER — POTASSIUM CHLORIDE CRYS ER 20 MEQ PO TBCR: 20.0000 meq | EXTENDED_RELEASE_TABLET | Freq: Every day | ORAL | 3 refills | Status: DC

## 2022-10-10 MED ORDER — HYDROCODONE-ACETAMINOPHEN 5-325 MG PO TABS: 1.0000 | ORAL_TABLET | Freq: Four times a day (QID) | ORAL | 0 refills | Status: DC | PRN

## 2022-10-10 NOTE — Addendum Note (Signed)
Addended by: Lucile Crater on: 10/10/2022 08:47 AM   Modules accepted: Orders

## 2022-10-10 NOTE — Addendum Note (Signed)
Addended by: Burney Gauze R on: 10/10/2022 04:59 PM   Modules accepted: Orders

## 2022-10-10 NOTE — Progress Notes (Signed)
PAC flushed and removed. No treatment or IVF needed.

## 2022-10-10 NOTE — Progress Notes (Addendum)
Hematology and Oncology Follow Up Visit  Bailey Walker [id number] [birth year] 77 y.o. 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 twice daily (14/7) -start in 05/23/2022 --Avastin added to start on 10/29/2022   Interim History:  Bailey Walker is here today for follow-up.  She is doing much better with the Xeloda.  She was taking Xeloda every day.  She had serious skin toxicity from this.  She ultimately was hospitalized.  She is doing much better now.  Patient still has the erythema on her hands.  She is taking for Xeloda twice a day.  We will have her go down to 3 Xeloda twice a day.  She is not sure when she is going back over to [place] to live.  There is too much uncertainty in the [place] right now.  Her last CEA level was holding pretty steady at 5.6.  She completed another course of radiosurgery in early January.  She has had no problems urinating.  There has been no fever.  She has had no bleeding.  Her last iron studies that were done back in November showed a ferritin of 240 with an iron saturation of 22%.  Overall, I would say performance status is probably ECOG 1.   Medications:  Allergies as of 10/10/2022       Reactions   Nitroglycerin Nausea And Vomiting   Ampicillin Other (See Comments)   Per allergy test        Medication List        Accurate as of October 10, 2022  8:29 AM. If you have any questions, ask your nurse or doctor.          acetaminophen 500 MG tablet Commonly known as: TYLENOL Take 500 mg by mouth every 6  (six) hours as needed for moderate pain.   alendronate 70 MG tablet Commonly known as: FOSAMAX Take 70 mg by mouth every Sunday.   aspirin EC 81 MG tablet Take 81 mg by mouth daily. Swallow whole.   atorvastatin 40 MG tablet Commonly known as: LIPITOR Take 1 tablet by mouth daily.   capecitabine 500 MG tablet Commonly known as: Xeloda Take 4 tablets (2,000 mg total) by mouth 2 (two) times daily after a meal. Take as instructed by MD.   cholestyramine light 4 g packet Commonly known as: PREVALITE Take 1 packet (4 g total) by mouth daily at 12 noon for 3 days.   diclofenac Sodium 1 % Gel Commonly known as: VOLTAREN Apply 4 g topically 4 (four) times daily. What changed:  when to take this reasons to take this   dicyclomine 10 MG capsule Commonly known as: BENTYL Take 1 capsule (10 mg total) by mouth 3 (three) times daily as needed for spasms.   feeding supplement (GLUCERNA SHAKE) Liqd Take 237 mLs by mouth 2 (two) times daily between meals.   glipiZIDE 5 MG tablet Commonly known as: GLUCOTROL TAKE 1 TABLET BY MOUTH TWICE A DAY BEFORE MEALS   HYDROcodone-acetaminophen 5-325 MG tablet Commonly known as: NORCO/VICODIN Take 1  tablet by mouth every 6 (six) hours as needed for moderate pain.   levothyroxine 75 MCG tablet Commonly known as: SYNTHROID TAKE 1 TABLET BY MOUTH EVERY DAY   lidocaine-prilocaine cream Commonly known as: EMLA Apply 1 Application topically as needed.   loperamide 2 MG capsule Commonly known as: IMODIUM TAKE 2 CAPSULES (4 MG TOTAL) BY MOUTH AS NEEDED. TAKE 2 AT DIARRHEA ONSET , THEN 1 EVERY 2HR UNTIL 12HRS WITH NO BM. MAY TAKE 2 EVERY 4HRS AT NIGHT. IF DIARRHEA RECURS REPEAT.   methocarbamol 500 MG tablet Commonly known as: ROBAXIN TAKE 1 TABLET BY MOUTH EVERY 12 HOURS AS NEEDED FOR MUSCLE SPASMS.   metoprolol tartrate 25 MG tablet Commonly known as: LOPRESSOR Take 1 tablet (25 mg total) by mouth 2 (two) times daily.   multivitamin with  minerals Tabs tablet Take 1 tablet by mouth daily.   ondansetron 8 MG tablet Commonly known as: ZOFRAN Take 1 tablet (8 mg total) by mouth every 8 (eight) hours as needed for vomiting or nausea. Start on day 3 after chemotherapy.   pantoprazole 40 MG tablet Commonly known as: PROTONIX Take 1 tablet (40 mg total) by mouth daily for 14 days.   potassium chloride SA 20 MEQ tablet Commonly known as: Klor-Con M20 Take 1 tablet (20 mEq total) by mouth 2 (two) times daily.   prednisoLONE acetate 1 % ophthalmic suspension Commonly known as: PRED FORTE Place 1 drop into both eyes every Monday.   prochlorperazine 10 MG tablet Commonly known as: COMPAZINE Take 1 tablet (10 mg total) by mouth every 6 (six) hours as needed (NAUSEA).   saccharomyces boulardii 250 MG capsule Commonly known as: FLORASTOR Take 1 capsule (250 mg total) by mouth 2 (two) times daily.   silver sulfADIAZINE 1 % cream Commonly known as: Silvadene Apply 1 application topically daily.   traMADol 50 MG tablet Commonly known as: ULTRAM Take 50 mg by mouth every 6 (six) hours as needed for moderate pain or severe pain.        Allergies:  Allergies  Allergen Reactions   Nitroglycerin Nausea And Vomiting   Ampicillin Other (See Comments)    Per allergy test     Past Medical History, Surgical history, Social history, and Family History were reviewed and updated.  Review of Systems: Review of Systems  HENT: Negative.    Eyes: Negative.   Respiratory:  Positive for shortness of breath.   Cardiovascular: Negative.   Gastrointestinal:  Positive for abdominal pain.  Genitourinary: Negative.   Musculoskeletal:  Positive for back pain.  Skin: Negative.   Neurological: Negative.   Endo/Heme/Allergies: Negative.   Psychiatric/Behavioral: Negative.       Physical Exam:  height is '4\' 11"'$  (1.499 m) and weight is 158 lb 1.3 oz (71.7 kg). Her oral temperature is 98.3 F (36.8 C). Her blood pressure is 134/82  and her pulse is 97. Her respiration is 20 and oxygen saturation is 99%.   Wt Readings from Last 3 Encounters:  10/10/22 158 lb 1.3 oz (71.7 kg)  09/06/22 156 lb 12 oz (71.1 kg)  09/03/22 159 lb (72.1 kg)    Physical Exam Vitals reviewed.  HENT:     Head: Normocephalic and atraumatic.  Eyes:     Pupils: Pupils are equal, round, and reactive to light.  Cardiovascular:     Rate and Rhythm: Normal rate and regular rhythm.     Heart sounds: Normal heart sounds.  Pulmonary:     Effort: Pulmonary effort is  normal.     Breath sounds: Normal breath sounds.  Abdominal:     General: Bowel sounds are normal.     Palpations: Abdomen is soft.     Comments: Her abdomen is slightly distended.  There is no obvious fluid wave.  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  She still has a dressing over the abdominal laparotomy scar.  She does not have any obvious erythema or warmth.  There is no exudate coming from the scars.  She has decent bowel sounds.  There is no palpable liver or spleen tip.  Musculoskeletal:        General: No tenderness or deformity. Normal range of motion.     Cervical back: Normal range of motion.  Lymphadenopathy:     Cervical: No cervical adenopathy.  Skin:    General: Skin is warm and dry.     Findings: No erythema or rash.  Neurological:     Mental Status: She is alert and oriented to person, place, and time.  Psychiatric:        Behavior: Behavior normal.        Thought Content: Thought content normal.        Judgment: Judgment normal.      Lab Results  Component Value Date   WBC 7.3 10/10/2022   HGB 11.1 (L) 10/10/2022   HCT 32.1 (L) 10/10/2022   MCV 103.9 (H) 10/10/2022   PLT 182 10/10/2022   Lab Results  Component Value Date   FERRITIN 240 08/07/2022   IRON 73 08/07/2022   TIBC 329 08/07/2022   UIBC 256 08/07/2022   IRONPCTSAT 22 08/07/2022   Lab Results  Component Value Date   RETICCTPCT 2.6 08/07/2022   RBC 3.09 (L) 10/10/2022   No  results found for: "KPAFRELGTCHN", "LAMBDASER", "KAPLAMBRATIO" No results found for: "IGGSERUM", "IGA", "IGMSERUM" No results found for: "TOTALPROTELP", "ALBUMINELP", "A1GS", "A2GS", "BETS", "BETA2SER", "GAMS", "MSPIKE", "SPEI"   Chemistry      Component Value Date/Time   NA 139 09/09/2022 0437   NA 139 07/12/2019 1110   K 3.5 09/09/2022 0437   CL 107 09/09/2022 0437   CO2 25 09/09/2022 0437   BUN 11 09/09/2022 0437   BUN 15 07/12/2019 1110   CREATININE 0.42 (L) 09/09/2022 0437   CREATININE 0.56 09/03/2022 1525   CREATININE 0.74 01/08/2017 1100      Component Value Date/Time   CALCIUM 8.3 (L) 09/09/2022 0437   ALKPHOS 30 (L) 09/09/2022 0437   AST 23 09/09/2022 0437   AST 24 09/03/2022 1525   ALT 23 09/09/2022 0437   ALT 21 09/03/2022 1525   BILITOT 0.9 09/09/2022 0437   BILITOT 0.6 09/03/2022 1525       Impression and Plan: Ms. 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   She has recurrent disease.  She has been on different lines of treatment.  We now have her on Xeloda.  I have not yet used Avastin because of the healing of the abdominal wound.  However, the abdominal wound seems to be doing quite well right now.  I think we could think about adding Avastin if we see that her CEA level goes up.  Again, we will try to keep adjusting her Xeloda.  I just want her quality of life to be as good as possible.  Again, she is planning on moving back to the [place] permanently.  She is not sure when she will go back because of the war going on.  We will plan to see her back in another few weeks.   Volanda Napoleon, MD 1/19/20248:29 AM  ADDENDUM: Unfortunately, the CEA level is going up a little bit.  The CEA level was 9.5.  As such, I think we are going to have to consider Avastin.  I think that the abdominal wound is healed up well enough that we could use Avastin in this  situation.  We will start the Avastin when she comes back in February.  I talked to she and her niece about this when I saw them today.  I do not have the CEA level back yet but I told him that we would use Avastin if the CEA level is higher.  They understand this.  Lattie Haw, MD

## 2022-10-10 NOTE — Patient Instructions (Signed)

## 2022-10-12 ENCOUNTER — Other Ambulatory Visit: Payer: Self-pay

## 2022-10-13 ENCOUNTER — Encounter: Payer: Self-pay | Admitting: *Deleted

## 2022-10-14 ENCOUNTER — Other Ambulatory Visit: Payer: Self-pay

## 2022-10-20 NOTE — Progress Notes (Signed)
Bailey Walker is here today for follow up post radiation to the lung.  Lung Side: Rt Lung Last Treatment Date: 2022-09-26   Does the patient complain of any of the following: Pain:Lower abdominal. Denies pain at radiation site. Shortness of breath w/wo exertion: With activity  Cough: no Hemoptysis: no Pain with swallowing: no Swallowing/choking concerns: no Appetite: good Energy Level: no issues Post radiation skin Changes: no issues    Additional comments if applicable: Vitals:   04/02/18 1034  BP: 135/72  Pulse: 78  Resp: 20  Temp: 97.8 F (36.6 C)  SpO2: 99%  Weight: 69.8 kg  Height: '4\' 11"'$  (1.499 m)

## 2022-10-23 ENCOUNTER — Encounter: Payer: Self-pay | Admitting: Radiation Oncology

## 2022-10-25 NOTE — Progress Notes (Signed)
  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 A diagnosis of Lung nodule was also pertinent to this visit.   Stage IIIB ([id number]) metastatic adenocarcinoma of the sigmoid colon; adnexal mass, and metastatic disease to the lung and an intra-abdominal lymph node - s/p sigmoid colectomy and chemotherapy  Intent: Curative  Radiation Treatment Dates: 09/17/2022 through 09/26/2022 Site Technique Total Dose (Gy) Dose per Fx (Gy) Completed Fx Beam Energies  Lung, Right: Lung_R IMRT 54/54 18 3/3 6XFFF   Narrative: The patient tolerated radiation therapy relatively well.   Plan: The patient will follow-up with radiation oncology in one month .  ________________________________________________ -----------------------------------  Blair Promise, PhD, MD  This document serves as a record of services personally performed by Gery Pray, MD. It was created on his behalf by Roney Mans, a trained medical scribe. The creation of this record is based on the scribe's personal observations and the provider's statements to them. This document has been checked and approved by the attending provider.

## 2022-10-26 NOTE — Progress Notes (Signed)
Radiation Oncology         [phone] ________________________________  Name: Bailey Walker MRN: [id number]  Date: 10/27/2022  DOB: 03/09/1946  Follow-Up Visit Note  CC: [name], MD  Volanda Napoleon, MD    ICD-10-CM   1. 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 A diagnosis of Lung nodule was also pertinent to this visit.   Stage IIIB ([id number]) metastatic adenocarcinoma of the sigmoid colon; adnexal mass, and metastatic disease to the lung and an intra-abdominal lymph node - s/p sigmoid colectomy and chemotherapy  Interval Since Last Radiation: 1 month   Intent: Curative  Radiation Treatment Dates: 09/17/2022 through 09/26/2022 Site Technique Total Dose (Gy) Dose per Fx (Gy) Completed Fx Beam Energies  Lung, Right: Lung_R IMRT 54/54 18 3/3 6XFFF    Narrative:  The patient returns today for routine follow-up. She tolerated radiation therapy relatively well without any significant side effects.       Prior to starting radiation therapy, the patient was hospitalized from 09/05/22 through 09/09/22 for management of abdominal pain, nausea, vomiting, and diarrhea, suspected to be secondary to gastroenteritis. CT of the abdomen performed did not show any acute abnormalities.  Chest x-ray also performed showed no active disease. Hospital course included Bentyl, Imodium, Florastor, and Questran which she continued following discharge.     Since completing radiation therapy, the patient followed up with Dr. Marin Olp on 10/10/22. During which time, the patient was noted to be doing much better on Xeloda. Moving forwards, Dr. Marin Olp will think about adding Avastin if her CEA level goes up.                        The patient does still plan on  moving back to [place] permanently. She is unsure when this will be due to safety concerns.   On evaluation today the patient denies any pain within the chest area significant cough or hemoptysis.  She denies any changes in her breathing.  Interpretation is with the assistance of family member.  Allergies:  is allergic to nitroglycerin and ampicillin.  Meds: Current Outpatient Medications  Medication Sig Dispense Refill   aspirin EC 81 MG tablet Take 81 mg by mouth daily. Swallow whole.     capecitabine (XELODA) 500 MG tablet Take 4 tablets (2,000 mg total) by mouth 2 (two) times daily after a meal. Take as instructed by MD. 224 tablet 1   glipiZIDE (GLUCOTROL) 5 MG tablet TAKE 1 TABLET BY MOUTH TWICE A DAY BEFORE MEALS 60 tablet 3   HYDROcodone-acetaminophen (NORCO/VICODIN) 5-325 MG tablet Take 1 tablet by mouth every 6 (six) hours as needed for moderate pain. 60 tablet 0   levothyroxine (SYNTHROID) 75 MCG tablet TAKE 1 TABLET BY MOUTH EVERY DAY 30 tablet 0   methocarbamol (ROBAXIN) 500 MG tablet TAKE 1 TABLET BY MOUTH EVERY 12 HOURS AS NEEDED FOR MUSCLE SPASMS. 60 tablet 2   metoprolol tartrate (LOPRESSOR) 25 MG tablet Take 1 tablet (25 mg total) by mouth 2 (two) times daily. 180 tablet 3   Multiple Vitamin (MULTIVITAMIN WITH MINERALS) TABS tablet Take 1 tablet by mouth daily. 30 tablet 0   potassium chloride SA (KLOR-CON M20) 20  MEQ tablet Take 1 tablet (20 mEq total) by mouth daily. 30 tablet 3   prednisoLONE acetate (PRED FORTE) 1 % ophthalmic suspension Place 1 drop into both eyes every Monday.     traMADol (ULTRAM) 50 MG tablet Take 50 mg by mouth every 6 (six) hours as needed for moderate pain or severe pain.     alendronate (FOSAMAX) 70 MG tablet Take 70 mg by mouth every Sunday. (Patient not taking: Reported on 10/10/2022)     diclofenac Sodium (VOLTAREN) 1 % GEL Apply 4 g topically 4 (four) times daily. (Patient not taking: Reported on 10/27/2022) 100 g 0   feeding supplement, GLUCERNA  SHAKE, (GLUCERNA SHAKE) LIQD Take 237 mLs by mouth 2 (two) times daily between meals. (Patient not taking: Reported on 10/10/2022) 10000 mL 0   lidocaine-prilocaine (EMLA) cream Apply 1 Application topically as needed. (Patient not taking: Reported on 10/10/2022) 30 g 3   loperamide (IMODIUM) 2 MG capsule TAKE 2 CAPSULES (4 MG TOTAL) BY MOUTH AS NEEDED. TAKE 2 AT DIARRHEA ONSET , THEN 1 EVERY 2HR UNTIL 12HRS WITH NO BM. MAY TAKE 2 EVERY 4HRS AT NIGHT. IF DIARRHEA RECURS REPEAT. (Patient not taking: Reported on 10/10/2022) 90 capsule 2   ondansetron (ZOFRAN) 8 MG tablet Take 1 tablet (8 mg total) by mouth every 8 (eight) hours as needed for vomiting or nausea. Start on day 3 after chemotherapy. (Patient not taking: Reported on 10/10/2022) 30 tablet 0   pantoprazole (PROTONIX) 40 MG tablet Take 1 tablet (40 mg total) by mouth daily for 14 days. 14 tablet 0   prochlorperazine (COMPAZINE) 10 MG tablet Take 1 tablet (10 mg total) by mouth every 6 (six) hours as needed (NAUSEA). (Patient not taking: Reported on 09/06/2022) 30 tablet 1   saccharomyces boulardii (FLORASTOR) 250 MG capsule Take 1 capsule (250 mg total) by mouth 2 (two) times daily. (Patient not taking: Reported on 10/10/2022) 30 capsule 0   silver sulfADIAZINE (SILVADENE) 1 % cream Apply 1 application topically daily. (Patient not taking: Reported on 09/06/2022) 50 g 2   No current facility-administered medications for this encounter.   Facility-Administered Medications Ordered in Other Encounters  Medication Dose Route Frequency Provider Last Rate Last Admin   pegfilgrastim-cbqv (UDENYCA) 6 MG/0.6ML injection             Physical Findings: The patient is in no acute distress. Patient is alert and oriented.  height is '4\' 11"'$  (1.499 m) and weight is 153 lb 12.8 oz (69.8 kg). Her temperature is 97.8 F (36.6 C). Her blood pressure is 135/72 and her pulse is 78. Her respiration is 20 and oxygen saturation is 99%. .  No significant changes. Lungs  are clear to auscultation bilaterally. Heart has regular rate and rhythm. No palpable cervical, supraclavicular, or axillary adenopathy. Abdomen soft, non-tender, normal bowel sounds.   Lab Findings: Lab Results  Component Value Date   WBC 7.3 10/10/2022   HGB 11.1 (L) 10/10/2022   HCT 32.1 (L) 10/10/2022   MCV 103.9 (H) 10/10/2022   PLT 182 10/10/2022    Radiographic Findings: No results found.  Impression:  New right lower lobe pulmonary nodule    The primary encounter diagnosis was 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 ([place]). A diagnosis of Lung nodule was also pertinent to this visit.   Stage IIIB ([id number]) metastatic adenocarcinoma of the sigmoid colon; adnexal mass, and metastatic disease to the lung and an intra-abdominal lymph node - s/p sigmoid colectomy and chemotherapy  She has recovered well from her radiation therapy.  No evidence of recurrence on clinical exam today.  Plan: As needed follow-up in radiation oncology.  She will continue to follow-up closely with Dr. Marin Olp and continue on Xeloda.    ____________________________________  Blair Promise, PhD, MD  This document serves as a record of services personally performed by Gery Pray, MD. It was created on his behalf by Roney Mans, a trained medical scribe. The creation of this record is based on the scribe's personal observations and the provider's statements to them. This document has been checked and approved by the attending provider.

## 2022-10-27 ENCOUNTER — Ambulatory Visit
Admission: RE | Admit: 2022-10-27 | Discharge: 2022-10-27 | Disposition: A | Discharge status: Home / Self Care | Payer: Medicaid Other | Source: Ambulatory Visit | Attending: Radiation Oncology | Admitting: Radiation Oncology

## 2022-10-27 ENCOUNTER — Encounter: Payer: Self-pay | Admitting: Radiation Oncology

## 2022-10-27 VITALS — BP 135/72 | HR 78 | Temp 97.8°F | Resp 20 | Ht 59.0 in | Wt 153.8 lb

## 2022-10-27 DIAGNOSIS — C772 Secondary and unspecified malignant neoplasm of intra-abdominal lymph nodes: Secondary | ICD-10-CM

## 2022-10-27 DIAGNOSIS — C187 Malignant neoplasm of sigmoid colon: Secondary | ICD-10-CM | POA: Insufficient documentation

## 2022-10-27 DIAGNOSIS — Z7984 Long term (current) use of oral hypoglycemic drugs: Secondary | ICD-10-CM | POA: Insufficient documentation

## 2022-10-27 DIAGNOSIS — Z79890 Hormone replacement therapy (postmenopausal): Secondary | ICD-10-CM | POA: Insufficient documentation

## 2022-10-27 DIAGNOSIS — R911 Solitary pulmonary nodule: Secondary | ICD-10-CM

## 2022-10-27 DIAGNOSIS — Z7982 Long term (current) use of aspirin: Secondary | ICD-10-CM | POA: Insufficient documentation

## 2022-10-27 DIAGNOSIS — Z9221 Personal history of antineoplastic chemotherapy: Secondary | ICD-10-CM | POA: Diagnosis not present

## 2022-10-29 ENCOUNTER — Other Ambulatory Visit (HOSPITAL_BASED_OUTPATIENT_CLINIC_OR_DEPARTMENT_OTHER): Payer: Self-pay

## 2022-10-29 ENCOUNTER — Inpatient Hospital Stay: Payer: Medicaid Other

## 2022-10-29 ENCOUNTER — Encounter: Payer: Self-pay | Admitting: Hematology & Oncology

## 2022-10-29 ENCOUNTER — Inpatient Hospital Stay: Payer: Medicaid Other | Admitting: Hematology & Oncology

## 2022-10-29 ENCOUNTER — Other Ambulatory Visit (HOSPITAL_COMMUNITY): Payer: Self-pay

## 2022-10-29 ENCOUNTER — Inpatient Hospital Stay: Payer: Medicaid Other | Attending: Radiation Oncology

## 2022-10-29 ENCOUNTER — Other Ambulatory Visit: Payer: Self-pay

## 2022-10-29 VITALS — BP 124/68 | HR 90 | Temp 97.9°F | Resp 18

## 2022-10-29 DIAGNOSIS — C772 Secondary and unspecified malignant neoplasm of intra-abdominal lymph nodes: Secondary | ICD-10-CM | POA: Diagnosis not present

## 2022-10-29 DIAGNOSIS — C187 Malignant neoplasm of sigmoid colon: Secondary | ICD-10-CM | POA: Diagnosis present

## 2022-10-29 DIAGNOSIS — Z5111 Encounter for antineoplastic chemotherapy: Secondary | ICD-10-CM | POA: Insufficient documentation

## 2022-10-29 DIAGNOSIS — D510 Vitamin B12 deficiency anemia due to intrinsic factor deficiency: Secondary | ICD-10-CM | POA: Insufficient documentation

## 2022-10-29 DIAGNOSIS — R221 Localized swelling, mass and lump, neck: Secondary | ICD-10-CM | POA: Insufficient documentation

## 2022-10-29 LAB — CBC WITH DIFFERENTIAL (CANCER CENTER ONLY)
Abs Immature Granulocytes: 0.04 10*3/uL (ref 0.00–0.07)
Basophils Absolute: 0.1 10*3/uL (ref 0.0–0.1)
Basophils Relative: 1 %
Eosinophils Absolute: 0.2 10*3/uL (ref 0.0–0.5)
Eosinophils Relative: 2 %
HCT: 35.9 % — ABNORMAL LOW (ref 36.0–46.0)
Hemoglobin: 11.8 g/dL — ABNORMAL LOW (ref 12.0–15.0)
Immature Granulocytes: 0 %
Lymphocytes Relative: 17 %
Lymphs Abs: 1.6 10*3/uL (ref 0.7–4.0)
MCH: 34.6 pg — ABNORMAL HIGH (ref 26.0–34.0)
MCHC: 32.9 g/dL (ref 30.0–36.0)
MCV: 105.3 fL — ABNORMAL HIGH (ref 80.0–100.0)
Monocytes Absolute: 0.8 10*3/uL (ref 0.1–1.0)
Monocytes Relative: 8 %
Neutro Abs: 6.9 10*3/uL (ref 1.7–7.7)
Neutrophils Relative %: 72 %
Platelet Count: 251 10*3/uL (ref 150–400)
RBC: 3.41 MIL/uL — ABNORMAL LOW (ref 3.87–5.11)
RDW: 14.9 % (ref 11.5–15.5)
WBC Count: 9.5 10*3/uL (ref 4.0–10.5)
nRBC: 0 % (ref 0.0–0.2)

## 2022-10-29 LAB — CMP (CANCER CENTER ONLY)
ALT: 17 U/L (ref 0–44)
AST: 21 U/L (ref 15–41)
Albumin: 4.3 g/dL (ref 3.5–5.0)
Alkaline Phosphatase: 50 U/L (ref 38–126)
Anion gap: 12 (ref 5–15)
BUN: 21 mg/dL (ref 8–23)
CO2: 23 mmol/L (ref 22–32)
Calcium: 9.8 mg/dL (ref 8.9–10.3)
Chloride: 100 mmol/L (ref 98–111)
Creatinine: 0.66 mg/dL (ref 0.44–1.00)
GFR, Estimated: >60 mL/min (ref >60)
Glucose, Bld: 166 mg/dL — ABNORMAL HIGH (ref 70–99)
Potassium: 4.3 mmol/L (ref 3.5–5.1)
Sodium: 135 mmol/L (ref 135–145)
Total Bilirubin: 0.9 mg/dL (ref 0.3–1.2)
Total Protein: 8 g/dL (ref 6.5–8.1)

## 2022-10-29 LAB — LACTATE DEHYDROGENASE: LDH: 224 U/L — ABNORMAL HIGH (ref 98–192)

## 2022-10-29 LAB — CEA (IN HOUSE-CHCC): CEA (CHCC-In House): 20.17 ng/mL — ABNORMAL HIGH (ref 0.00–5.00)

## 2022-10-29 LAB — TOTAL PROTEIN, URINE DIPSTICK: Protein, ur: NEGATIVE mg/dL

## 2022-10-29 MED ORDER — SODIUM CHLORIDE 0.9% FLUSH
10.0000 mL | INTRAVENOUS | Status: DC | PRN
  Administered 2022-10-29: 10 mL

## 2022-10-29 MED ORDER — SODIUM CHLORIDE 0.9 % IV SOLN
7.5000 mg/kg | Freq: Once | INTRAVENOUS | Status: AC
  Administered 2022-10-29: 500 mg via INTRAVENOUS
  Filled 2022-10-29: qty 20

## 2022-10-29 MED ORDER — LIDOCAINE-PRILOCAINE 2.5-2.5 % EX CREA
TOPICAL_CREAM | CUTANEOUS | 3 refills | Status: DC
  Filled 2022-10-29: qty 30, 30d supply, fill #0

## 2022-10-29 MED ORDER — SODIUM CHLORIDE 0.9 % IV SOLN: Freq: Once | INTRAVENOUS | Status: AC

## 2022-10-29 MED ORDER — HEPARIN SOD (PORK) LOCK FLUSH 100 UNIT/ML IV SOLN
500.0000 [IU] | Freq: Once | INTRAVENOUS | Status: AC | PRN
  Administered 2022-10-29: 500 [IU]

## 2022-10-29 NOTE — Progress Notes (Signed)
Hematology and Oncology Follow Up Visit  Bailey Walker [id number] [birth year] 77 y.o. 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 twice daily (14/7) -start in 05/23/2022 --Avastin added to start on 10/29/2022   Interim History:  Bailey Walker is here today for follow-up.  Unfortunately, her last CEA level was up to 20.  This is little bit disturbing.  Because of this, I think we are going to have to start Avastin on her.  I try to hold off as long as possible on Avastin just because of the healing of the abdominal laparotomy wound.  I think that is healed well enough now that we could try Avastin.  She is having little more pain in the right lower quadrant of her abdomen.  I suspect we may need to think about getting a CT scan on her.  Her last CT scan was done back in mid December.  She still is not sure when she will be going over to [place] to be with her family.  With all the uncertainty going on over there right now, sounds like she will be in the [place] for a while.  She is eating okay.  The ostomy is working all right.  There is been no bleeding.  She has had no cough or shortness of breath.  Patient did have the radiosurgery for the left lower lobe nodule.  There has been no fever.  She has had no rashes.  She had little bit of leg swelling.  Overall, I would say that her performance status is probably ECOG 2.    Medications:  Allergies as of 10/29/2022       Reactions   Nitroglycerin Nausea And Vomiting   Ampicillin Other (See Comments)   Per  allergy test        Medication List        Accurate as of October 29, 2022  8:26 AM. If you have any questions, ask your nurse or doctor.          alendronate 70 MG tablet Commonly known as: FOSAMAX Take 70 mg by mouth every Sunday.   aspirin EC 81 MG tablet Take 81 mg by mouth daily. Swallow whole.   capecitabine 500 MG tablet Commonly known as: Xeloda Take 4 tablets (2,000 mg total) by mouth 2 (two) times daily after a meal. Take as instructed by MD.   diclofenac Sodium 1 % Gel Commonly known as: VOLTAREN Apply 4 g topically 4 (four) times daily.   feeding supplement (GLUCERNA SHAKE) Liqd Take 237 mLs by mouth 2 (two) times daily between meals.   glipiZIDE 5 MG tablet Commonly known as: GLUCOTROL TAKE 1 TABLET BY MOUTH TWICE A DAY BEFORE MEALS   HYDROcodone-acetaminophen 5-325 MG tablet Commonly known as: NORCO/VICODIN Take 1 tablet by mouth every 6 (six) hours as needed for moderate pain.   levothyroxine 75 MCG tablet Commonly known as: SYNTHROID TAKE 1 TABLET BY MOUTH EVERY DAY   lidocaine-prilocaine cream Commonly known as: EMLA Apply 1 Application topically as needed.  loperamide 2 MG capsule Commonly known as: IMODIUM TAKE 2 CAPSULES (4 MG TOTAL) BY MOUTH AS NEEDED. TAKE 2 AT DIARRHEA ONSET , THEN 1 EVERY 2HR UNTIL 12HRS WITH NO BM. MAY TAKE 2 EVERY 4HRS AT NIGHT. IF DIARRHEA RECURS REPEAT.   methocarbamol 500 MG tablet Commonly known as: ROBAXIN TAKE 1 TABLET BY MOUTH EVERY 12 HOURS AS NEEDED FOR MUSCLE SPASMS.   metoprolol tartrate 25 MG tablet Commonly known as: LOPRESSOR Take 1 tablet (25 mg total) by mouth 2 (two) times daily.   multivitamin with minerals Tabs tablet Take 1 tablet by mouth daily.   ondansetron 8 MG tablet Commonly known as: ZOFRAN Take 1 tablet (8 mg total) by mouth every 8 (eight) hours as needed for vomiting or nausea. Start on day 3 after chemotherapy.   pantoprazole 40 MG tablet Commonly known as: PROTONIX Take 1  tablet (40 mg total) by mouth daily for 14 days.   potassium chloride SA 20 MEQ tablet Commonly known as: Klor-Con M20 Take 1 tablet (20 mEq total) by mouth daily.   prednisoLONE acetate 1 % ophthalmic suspension Commonly known as: PRED FORTE Place 1 drop into both eyes every Monday.   prochlorperazine 10 MG tablet Commonly known as: COMPAZINE Take 1 tablet (10 mg total) by mouth every 6 (six) hours as needed (NAUSEA).   saccharomyces boulardii 250 MG capsule Commonly known as: FLORASTOR Take 1 capsule (250 mg total) by mouth 2 (two) times daily.   silver sulfADIAZINE 1 % cream Commonly known as: Silvadene Apply 1 application topically daily.   traMADol 50 MG tablet Commonly known as: ULTRAM Take 50 mg by mouth every 6 (six) hours as needed for moderate pain or severe pain.        Allergies:  Allergies  Allergen Reactions   Nitroglycerin Nausea And Vomiting   Ampicillin Other (See Comments)    Per allergy test     Past Medical History, Surgical history, Social history, and Family History were reviewed and updated.  Review of Systems: Review of Systems  HENT: Negative.    Eyes: Negative.   Respiratory:  Positive for shortness of breath.   Cardiovascular: Negative.   Gastrointestinal:  Positive for abdominal pain.  Genitourinary: Negative.   Musculoskeletal:  Positive for back pain.  Skin: Negative.   Neurological: Negative.   Endo/Heme/Allergies: Negative.   Psychiatric/Behavioral: Negative.       Physical Exam:  height is '4\' 11"'$  (1.499 m) and weight is 154 lb (69.9 kg). Her temperature is 97.8 F (36.6 C). Her blood pressure is 120/68 and her pulse is 98. Her respiration is 19 and oxygen saturation is 100%.   Wt Readings from Last 3 Encounters:  10/29/22 154 lb (69.9 kg)  10/27/22 153 lb 12.8 oz (69.8 kg)  10/10/22 158 lb 1.3 oz (71.7 kg)    Physical Exam Vitals reviewed.  HENT:     Head: Normocephalic and atraumatic.  Eyes:     Pupils: Pupils  are equal, round, and reactive to light.  Cardiovascular:     Rate and Rhythm: Normal rate and regular rhythm.     Heart sounds: Normal heart sounds.  Pulmonary:     Effort: Pulmonary effort is normal.     Breath sounds: Normal breath sounds.  Abdominal:     General: Bowel sounds are normal.     Palpations: Abdomen is soft.     Comments: Her abdomen is slightly distended.  There is no obvious fluid wave.  The colostomy is in  the left lower quadrant.  She still has a dressing over the abdominal laparotomy scar.  She does not have any obvious erythema or warmth.  There is no exudate coming from the scars.  She has decent bowel sounds.  There is no palpable liver or spleen tip.  Musculoskeletal:        General: No tenderness or deformity. Normal range of motion.     Cervical back: Normal range of motion.  Lymphadenopathy:     Cervical: No cervical adenopathy.  Skin:    General: Skin is warm and dry.     Findings: No erythema or rash.  Neurological:     Mental Status: She is alert and oriented to person, place, and time.  Psychiatric:        Behavior: Behavior normal.        Thought Content: Thought content normal.        Judgment: Judgment normal.      Lab Results  Component Value Date   WBC 7.3 10/10/2022   HGB 11.1 (L) 10/10/2022   HCT 32.1 (L) 10/10/2022   MCV 103.9 (H) 10/10/2022   PLT 182 10/10/2022   Lab Results  Component Value Date   FERRITIN 267 10/10/2022   IRON 146 10/10/2022   TIBC 328 10/10/2022   UIBC 182 10/10/2022   IRONPCTSAT 45 (H) 10/10/2022   Lab Results  Component Value Date   RETICCTPCT 2.6 08/07/2022   RBC 3.09 (L) 10/10/2022   No results found for: "KPAFRELGTCHN", "LAMBDASER", "KAPLAMBRATIO" No results found for: "IGGSERUM", "IGA", "IGMSERUM" No results found for: "TOTALPROTELP", "ALBUMINELP", "A1GS", "A2GS", "BETS", "BETA2SER", "GAMS", "MSPIKE", "SPEI"   Chemistry      Component Value Date/Time   NA 138 10/10/2022 0820   NA 139 07/12/2019  1110   K 2.9 (L) 10/10/2022 0820   CL 100 10/10/2022 0820   CO2 26 10/10/2022 0820   BUN 14 10/10/2022 0820   BUN 15 07/12/2019 1110   CREATININE 0.57 10/10/2022 0820   CREATININE 0.74 01/08/2017 1100      Component Value Date/Time   CALCIUM 9.5 10/10/2022 0820   ALKPHOS 42 10/10/2022 0820   AST 15 10/10/2022 0820   ALT 12 10/10/2022 0820   BILITOT 0.9 10/10/2022 0820       Impression and Plan: Bailey Walker is a very pleasant 77 yo Kurdish female with stage IIIB ([id number]) 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   She has recurrent disease.  She has been on different lines of treatment.  We now have her on Xeloda.  We will now add Avastin.  We will have to see about doing a CT scan on her.  Will see back in this set up in about 2 or 3 weeks.  Hopefully, the Avastin might be able to help.  I would like to give the Avastin try.  I do not want to stop it too soon.  We will continue to follow her CEA level.    Volanda Napoleon, MD 2/7/20248:26 AM

## 2022-10-29 NOTE — Patient Instructions (Signed)

## 2022-10-29 NOTE — Patient Instructions (Signed)
[facility]  Discharge Instructions: Thank you for choosing [facility] to provide your oncology and hematology care.   If you have a lab appointment with the [facility], please go directly to the [facility] and check in at the registration area.  Wear comfortable clothing and clothing appropriate for easy access to any Portacath or PICC line.   We strive to give you quality time with your provider. You may need to reschedule your appointment if you arrive late (15 or more minutes).  Arriving late affects you and other patients whose appointments are after yours.  Also, if you miss three or more appointments without notifying the office, you may be dismissed from the clinic at the provider's discretion.      For prescription refill requests, have your pharmacy contact our office and allow 72 hours for refills to be completed.    Today you received the following chemotherapy and/or immunotherapy agents Bevacizumab.   To help prevent nausea and vomiting after your treatment, we encourage you to take your nausea medication as directed.  BELOW ARE SYMPTOMS THAT SHOULD BE REPORTED IMMEDIATELY: *FEVER GREATER THAN 100.4 F (38 C) OR HIGHER *CHILLS OR SWEATING *NAUSEA AND VOMITING THAT IS NOT CONTROLLED WITH YOUR NAUSEA MEDICATION *UNUSUAL SHORTNESS OF BREATH *UNUSUAL BRUISING OR BLEEDING *URINARY PROBLEMS (pain or burning when urinating, or frequent urination) *BOWEL PROBLEMS (unusual diarrhea, constipation, pain near the anus) TENDERNESS IN MOUTH AND THROAT WITH OR WITHOUT PRESENCE OF ULCERS (sore throat, sores in mouth, or a toothache) UNUSUAL RASH, SWELLING OR PAIN  UNUSUAL VAGINAL DISCHARGE OR ITCHING   Items with * indicate a potential emergency and should be followed up as soon as possible or go to the Emergency Department if any problems should occur.  Please show the CHEMOTHERAPY ALERT CARD or IMMUNOTHERAPY ALERT CARD at  check-in to the Emergency Department and triage nurse. Should you have questions after your visit or need to cancel or reschedule your appointment, please contact [facility]  [phone] and follow the prompts.  Office hours are 8:00 a.m. to 4:30 p.m. Monday - Friday. Please note that voicemails left after 4:00 p.m. may not be returned until the following business day.  We are closed weekends and major holidays. You have access to a nurse at all times for urgent questions. Please call the main number to the clinic [phone] and follow the prompts.  For any non-urgent questions, you may also contact your provider using MyChart. We now offer e-Visits for anyone 58 and older to request care online for non-urgent symptoms. For details visit [email or web address].   Also download the MyChart app! Go to the app store, search "MyChart", open the app, select [facility], and log in with your MyChart username and password.

## 2022-10-30 ENCOUNTER — Other Ambulatory Visit: Payer: Self-pay

## 2022-10-31 ENCOUNTER — Telehealth: Payer: Self-pay | Admitting: *Deleted

## 2022-10-31 ENCOUNTER — Other Ambulatory Visit: Payer: Self-pay

## 2022-10-31 ENCOUNTER — Encounter: Payer: Self-pay | Admitting: *Deleted

## 2022-10-31 NOTE — Telephone Encounter (Signed)
Message received from patient's niece [name] to notify Dr. Marin Olp that the bottom's of patient's feet and hands are red and tender.  Dr. Marin Olp notified.  Call placed back to [name] and [name] notified per order of Dr. Marin Olp to have pt hold Xeloda until her next appt with Dr. Marin Olp on 11/19/22.  Teach back done.  [name] is appreciative of call back and has no questions at this time.

## 2022-11-05 ENCOUNTER — Other Ambulatory Visit: Payer: Self-pay

## 2022-11-07 ENCOUNTER — Other Ambulatory Visit (HOSPITAL_BASED_OUTPATIENT_CLINIC_OR_DEPARTMENT_OTHER): Payer: Self-pay

## 2022-11-12 ENCOUNTER — Ambulatory Visit (HOSPITAL_COMMUNITY): Payer: Medicaid Other

## 2022-11-15 ENCOUNTER — Other Ambulatory Visit: Payer: Self-pay

## 2022-11-18 ENCOUNTER — Ambulatory Visit (HOSPITAL_COMMUNITY)
Admission: RE | Admit: 2022-11-18 | Discharge: 2022-11-18 | Disposition: A | Discharge status: Home / Self Care | Payer: Medicaid Other | Source: Ambulatory Visit | Attending: Hematology & Oncology | Admitting: Hematology & Oncology

## 2022-11-18 DIAGNOSIS — C772 Secondary and unspecified malignant neoplasm of intra-abdominal lymph nodes: Secondary | ICD-10-CM | POA: Insufficient documentation

## 2022-11-18 DIAGNOSIS — C187 Malignant neoplasm of sigmoid colon: Secondary | ICD-10-CM | POA: Diagnosis present

## 2022-11-18 MED ORDER — SODIUM CHLORIDE (PF) 0.9 % IJ SOLN
INTRAMUSCULAR | Status: AC
  Filled 2022-11-18: qty 50

## 2022-11-18 MED ORDER — IOHEXOL 300 MG/ML  SOLN
100.0000 mL | Freq: Once | INTRAMUSCULAR | Status: AC | PRN
  Administered 2022-11-18: 100 mL via INTRAVENOUS

## 2022-11-18 MED ORDER — HEPARIN SOD (PORK) LOCK FLUSH 100 UNIT/ML IV SOLN
INTRAVENOUS | Status: AC
  Administered 2022-11-18: 500 [IU] via INTRAVENOUS
  Filled 2022-11-18: qty 5

## 2022-11-18 MED ORDER — HEPARIN SOD (PORK) LOCK FLUSH 100 UNIT/ML IV SOLN: 500.0000 [IU] | Freq: Once | INTRAVENOUS | Status: AC

## 2022-11-18 MED ORDER — IOHEXOL 9 MG/ML PO SOLN
1000.0000 mL | ORAL | Status: AC
  Administered 2022-11-18: 1000 mL via ORAL

## 2022-11-18 MED ORDER — IOHEXOL 9 MG/ML PO SOLN
ORAL | Status: AC
  Filled 2022-11-18: qty 1000

## 2022-11-19 ENCOUNTER — Encounter: Payer: Self-pay | Admitting: *Deleted

## 2022-11-19 ENCOUNTER — Other Ambulatory Visit: Payer: Self-pay

## 2022-11-19 ENCOUNTER — Encounter: Payer: Self-pay | Admitting: Hematology & Oncology

## 2022-11-19 ENCOUNTER — Ambulatory Visit (HOSPITAL_BASED_OUTPATIENT_CLINIC_OR_DEPARTMENT_OTHER): Admission: RE | Admit: 2022-11-19 | Payer: Medicaid Other | Source: Ambulatory Visit

## 2022-11-19 ENCOUNTER — Inpatient Hospital Stay: Payer: Medicaid Other | Admitting: Hematology & Oncology

## 2022-11-19 ENCOUNTER — Ambulatory Visit (HOSPITAL_BASED_OUTPATIENT_CLINIC_OR_DEPARTMENT_OTHER)
Admission: RE | Admit: 2022-11-19 | Discharge: 2022-11-19 | Disposition: A | Discharge status: Home / Self Care | Payer: Medicaid Other | Source: Ambulatory Visit | Attending: Hematology & Oncology | Admitting: Hematology & Oncology

## 2022-11-19 ENCOUNTER — Inpatient Hospital Stay: Payer: Medicaid Other

## 2022-11-19 VITALS — BP 143/66 | HR 83 | Temp 98.6°F | Resp 18

## 2022-11-19 VITALS — BP 143/73 | HR 97 | Temp 98.2°F | Resp 17 | Ht 59.0 in | Wt 155.4 lb

## 2022-11-19 DIAGNOSIS — Z5111 Encounter for antineoplastic chemotherapy: Secondary | ICD-10-CM | POA: Diagnosis not present

## 2022-11-19 DIAGNOSIS — R221 Localized swelling, mass and lump, neck: Secondary | ICD-10-CM

## 2022-11-19 DIAGNOSIS — C187 Malignant neoplasm of sigmoid colon: Secondary | ICD-10-CM

## 2022-11-19 DIAGNOSIS — C772 Secondary and unspecified malignant neoplasm of intra-abdominal lymph nodes: Secondary | ICD-10-CM

## 2022-11-19 LAB — CBC WITH DIFFERENTIAL (CANCER CENTER ONLY)
Abs Immature Granulocytes: 0.03 10*3/uL (ref 0.00–0.07)
Basophils Absolute: 0.1 10*3/uL (ref 0.0–0.1)
Basophils Relative: 1 %
Eosinophils Absolute: 0.2 10*3/uL (ref 0.0–0.5)
Eosinophils Relative: 3 %
HCT: 34.9 % — ABNORMAL LOW (ref 36.0–46.0)
Hemoglobin: 11.6 g/dL — ABNORMAL LOW (ref 12.0–15.0)
Immature Granulocytes: 0 %
Lymphocytes Relative: 21 %
Lymphs Abs: 1.4 10*3/uL (ref 0.7–4.0)
MCH: 34.8 pg — ABNORMAL HIGH (ref 26.0–34.0)
MCHC: 33.2 g/dL (ref 30.0–36.0)
MCV: 104.8 fL — ABNORMAL HIGH (ref 80.0–100.0)
Monocytes Absolute: 1 10*3/uL (ref 0.1–1.0)
Monocytes Relative: 15 %
Neutro Abs: 4.1 10*3/uL (ref 1.7–7.7)
Neutrophils Relative %: 60 %
Platelet Count: 172 10*3/uL (ref 150–400)
RBC: 3.33 MIL/uL — ABNORMAL LOW (ref 3.87–5.11)
RDW: 15.5 % (ref 11.5–15.5)
WBC Count: 6.7 10*3/uL (ref 4.0–10.5)
nRBC: 0 % (ref 0.0–0.2)

## 2022-11-19 LAB — CMP (CANCER CENTER ONLY)
ALT: 17 U/L (ref 0–44)
AST: 22 U/L (ref 15–41)
Albumin: 3.9 g/dL (ref 3.5–5.0)
Alkaline Phosphatase: 45 U/L (ref 38–126)
Anion gap: 11 (ref 5–15)
BUN: 10 mg/dL (ref 8–23)
CO2: 28 mmol/L (ref 22–32)
Calcium: 9.3 mg/dL (ref 8.9–10.3)
Chloride: 103 mmol/L (ref 98–111)
Creatinine: 0.59 mg/dL (ref 0.44–1.00)
GFR, Estimated: >60 mL/min (ref >60)
Glucose, Bld: 122 mg/dL — ABNORMAL HIGH (ref 70–99)
Potassium: 3.1 mmol/L — ABNORMAL LOW (ref 3.5–5.1)
Sodium: 142 mmol/L (ref 135–145)
Total Bilirubin: 0.7 mg/dL (ref 0.3–1.2)
Total Protein: 6.7 g/dL (ref 6.5–8.1)

## 2022-11-19 LAB — LACTATE DEHYDROGENASE: LDH: 175 U/L (ref 98–192)

## 2022-11-19 LAB — CEA (IN HOUSE-CHCC): CEA (CHCC-In House): 15.59 ng/mL — ABNORMAL HIGH (ref 0.00–5.00)

## 2022-11-19 MED ORDER — FENTANYL 25 MCG/HR TD PT72: 1.0000 | MEDICATED_PATCH | TRANSDERMAL | 0 refills | Status: DC

## 2022-11-19 MED ORDER — OXYCODONE HCL 5 MG PO TABS: 5.0000 mg | ORAL_TABLET | Freq: Four times a day (QID) | ORAL | 0 refills | Status: AC | PRN

## 2022-11-19 MED ORDER — HEPARIN SOD (PORK) LOCK FLUSH 100 UNIT/ML IV SOLN
500.0000 [IU] | Freq: Once | INTRAVENOUS | Status: AC | PRN
  Administered 2022-11-19: 500 [IU]

## 2022-11-19 MED ORDER — SODIUM CHLORIDE 0.9% FLUSH
10.0000 mL | INTRAVENOUS | Status: DC | PRN
  Administered 2022-11-19: 10 mL

## 2022-11-19 MED ORDER — SODIUM CHLORIDE 0.9 % IV SOLN
7.5000 mg/kg | Freq: Once | INTRAVENOUS | Status: AC
  Administered 2022-11-19: 500 mg via INTRAVENOUS
  Filled 2022-11-19: qty 20

## 2022-11-19 MED ORDER — DOXYCYCLINE HYCLATE 100 MG PO TABS: 100.0000 mg | ORAL_TABLET | Freq: Two times a day (BID) | ORAL | 0 refills | Status: AC

## 2022-11-19 MED ORDER — MUPIROCIN 2 % EX OINT: TOPICAL_OINTMENT | Freq: Three times a day (TID) | CUTANEOUS | 0 refills | Status: AC

## 2022-11-19 MED ORDER — SODIUM CHLORIDE 0.9 % IV SOLN: Freq: Once | INTRAVENOUS | Status: AC

## 2022-11-19 NOTE — Progress Notes (Signed)
Approval received from [facility] for Fentanyl 25 mcg patch through 02/17/23 and Oxycodone 5 mg tab through 05/18/23.

## 2022-11-19 NOTE — Progress Notes (Addendum)
Hematology and Oncology Follow Up Visit  Bailey Walker [id number] 28-Dec-1945 77 y.o. 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 twice daily (14/7) -start in 05/23/2022 --Avastin added to start on 10/29/2022 --Xeloda DC'd on 11/11/2022   Interim History:  Bailey Walker is here today for follow-up.  The problem now is that she woke up this morning.  She has pain and swelling on the right side of her neck.  It seems like this might be a blocked salivary gland.  She does not have any teeth.  I was inside of her mouth.  I do not see anything that looked suspicious.  However, on exam, she has a swollen salivary gland on the right side.  This appears to be quite tender.  She had a CT scan done yesterday of the body.  Unfortunate, this is not yet back.  It is apparent that she cannot tolerate the Xeloda.  She has a lot of mild PPE, mostly with the feet.  We have cut her dose of Xeloda back.  As such, we probably will have to DC the Xeloda.  The CAT scan will really help [place] out.  If we do see that she has progressive disease, then I might switch her over to a Lonsurf.  The problem with that is that she is post be going over to [place] to live.  I am unsure how we would be able to do the [place] over in [place].  She still has a lot of pain in the abdomen.  Toradol is not working.  We did have to get her on a Duragesic patch.  When I examined her abdomen, there appears to be maybe a superficial wound where she had the abdominal incision.  It took forever for this to heal up.  I will  see about Bactroban.  I do think she is likely need to have a patch for pain.  I will try her on a Duragesic patch.  Hopefully, this might help with some of the discomfort.  I had to believe that given all of her symptoms, that she is going to have progressive disease.  She is eating.  I will think she is lost much weight.  Her colostomy is working okay.  She does not have any blood in the colostomy.  Overall, I would say that her performance status is probably ECOG 2.    Medications:  Allergies as of 11/19/2022       Reactions   Nitroglycerin Nausea And Vomiting   Ampicillin Other (See Comments)   Per allergy test        Medication List        Accurate as of November 19, 2022  8:29 AM. If you have any questions, ask your nurse or doctor.          alendronate 70 MG tablet Commonly known as: FOSAMAX Take 70 mg by mouth every Sunday.   aspirin EC 81 MG tablet Take 81 mg by mouth daily. Swallow whole.   capecitabine 500 MG tablet Commonly known  as: Xeloda Take 4 tablets (2,000 mg total) by mouth 2 (two) times daily after a meal. Take as instructed by MD.   diclofenac Sodium 1 % Gel Commonly known as: VOLTAREN Apply 4 g topically 4 (four) times daily.   feeding supplement (GLUCERNA SHAKE) Liqd Take 237 mLs by mouth 2 (two) times daily between meals.   glipiZIDE 5 MG tablet Commonly known as: GLUCOTROL TAKE 1 TABLET BY MOUTH TWICE A DAY BEFORE MEALS   HYDROcodone-acetaminophen 5-325 MG tablet Commonly known as: NORCO/VICODIN Take 1 tablet by mouth every 6 (six) hours as needed for moderate pain.   levothyroxine 75 MCG tablet Commonly known as: SYNTHROID TAKE 1 TABLET BY MOUTH EVERY DAY   lidocaine-prilocaine cream Commonly known as: EMLA Apply 1 Application topically as needed.   lidocaine-prilocaine cream Commonly known as: EMLA Apply to affected area once   loperamide 2 MG capsule Commonly known as: IMODIUM TAKE 2 CAPSULES (4 MG TOTAL) BY MOUTH AS  NEEDED. TAKE 2 AT DIARRHEA ONSET , THEN 1 EVERY 2HR UNTIL 12HRS WITH NO BM. MAY TAKE 2 EVERY 4HRS AT NIGHT. IF DIARRHEA RECURS REPEAT.   methocarbamol 500 MG tablet Commonly known as: ROBAXIN TAKE 1 TABLET BY MOUTH EVERY 12 HOURS AS NEEDED FOR MUSCLE SPASMS.   metoprolol tartrate 25 MG tablet Commonly known as: LOPRESSOR Take 1 tablet (25 mg total) by mouth 2 (two) times daily.   multivitamin with minerals Tabs tablet Take 1 tablet by mouth daily.   ondansetron 8 MG tablet Commonly known as: ZOFRAN Take 1 tablet (8 mg total) by mouth every 8 (eight) hours as needed for vomiting or nausea. Start on day 3 after chemotherapy.   pantoprazole 40 MG tablet Commonly known as: PROTONIX Take 1 tablet (40 mg total) by mouth daily for 14 days.   potassium chloride SA 20 MEQ tablet Commonly known as: Klor-Con M20 Take 1 tablet (20 mEq total) by mouth daily.   prednisoLONE acetate 1 % ophthalmic suspension Commonly known as: PRED FORTE Place 1 drop into both eyes every Monday.   prochlorperazine 10 MG tablet Commonly known as: COMPAZINE Take 1 tablet (10 mg total) by mouth every 6 (six) hours as needed (NAUSEA).   saccharomyces boulardii 250 MG capsule Commonly known as: FLORASTOR Take 1 capsule (250 mg total) by mouth 2 (two) times daily.   silver sulfADIAZINE 1 % cream Commonly known as: Silvadene Apply 1 application topically daily.   traMADol 50 MG tablet Commonly known as: ULTRAM Take 50 mg by mouth every 6 (six) hours as needed for moderate pain or severe pain.        Allergies:  Allergies  Allergen Reactions   Nitroglycerin Nausea And Vomiting   Ampicillin Other (See Comments)    Per allergy test     Past Medical History, Surgical history, Social history, and Family History were reviewed and updated.  Review of Systems: Review of Systems  HENT: Negative.    Eyes: Negative.   Respiratory:  Positive for shortness of breath.   Cardiovascular: Negative.    Gastrointestinal:  Positive for abdominal pain.  Genitourinary: Negative.   Musculoskeletal:  Positive for back pain.  Skin: Negative.   Neurological: Negative.   Endo/Heme/Allergies: Negative.   Psychiatric/Behavioral: Negative.       Physical Exam:  height is '4\' 11"'$  (1.499 m) and weight is 155 lb 6.4 oz (70.5 kg). Her oral temperature is 98.2 F (36.8 C). Her blood pressure is 143/73 (abnormal) and her pulse is 97. Her respiration  is 17 and oxygen saturation is 98%.   Wt Readings from Last 3 Encounters:  11/19/22 155 lb 6.4 oz (70.5 kg)  10/29/22 154 lb (69.9 kg)  10/27/22 153 lb 12.8 oz (69.8 kg)    Physical Exam Vitals reviewed.  HENT:     Head: Normocephalic and atraumatic.  Eyes:     Pupils: Pupils are equal, round, and reactive to light.  Neck:     Comments: In the right upper neck, there is a swollen gland.  It is quite tender. Cardiovascular:     Rate and Rhythm: Normal rate and regular rhythm.     Heart sounds: Normal heart sounds.  Pulmonary:     Effort: Pulmonary effort is normal.     Breath sounds: Normal breath sounds.  Abdominal:     General: Bowel sounds are normal.     Palpations: Abdomen is soft.     Comments: Her abdomen is slightly distended.  There is no obvious fluid wave.  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  She still has a dressing over the abdominal laparotomy scar.  She does not have any obvious erythema or warmth.  There is no exudate coming from the scars.  She has decent bowel sounds.  There is no palpable liver or spleen tip.  Musculoskeletal:        General: No tenderness or deformity. Normal range of motion.     Cervical back: Normal range of motion.  Lymphadenopathy:     Cervical: No cervical adenopathy.  Skin:    General: Skin is warm and dry.     Findings: No erythema or rash.     Comments: She has quite erythema on the hands and feet.  There is some skin peeling.  Neurological:     Mental Status: She is alert and oriented  to person, place, and time.  Psychiatric:        Behavior: Behavior normal.        Thought Content: Thought content normal.        Judgment: Judgment normal.      Lab Results  Component Value Date   WBC 9.5 10/29/2022   HGB 11.8 (L) 10/29/2022   HCT 35.9 (L) 10/29/2022   MCV 105.3 (H) 10/29/2022   PLT 251 10/29/2022   Lab Results  Component Value Date   FERRITIN 267 10/10/2022   IRON 146 10/10/2022   TIBC 328 10/10/2022   UIBC 182 10/10/2022   IRONPCTSAT 45 (H) 10/10/2022   Lab Results  Component Value Date   RETICCTPCT 2.6 08/07/2022   RBC 3.41 (L) 10/29/2022   No results found for: "KPAFRELGTCHN", "LAMBDASER", "KAPLAMBRATIO" No results found for: "IGGSERUM", "IGA", "IGMSERUM" No results found for: "TOTALPROTELP", "ALBUMINELP", "A1GS", "A2GS", "BETS", "BETA2SER", "GAMS", "MSPIKE", "SPEI"   Chemistry      Component Value Date/Time   NA 135 10/29/2022 0820   NA 139 07/12/2019 1110   K 4.3 10/29/2022 0820   CL 100 10/29/2022 0820   CO2 23 10/29/2022 0820   BUN 21 10/29/2022 0820   BUN 15 07/12/2019 1110   CREATININE 0.66 10/29/2022 0820   CREATININE 0.74 01/08/2017 1100      Component Value Date/Time   CALCIUM 9.8 10/29/2022 0820   ALKPHOS 50 10/29/2022 0820   AST 21 10/29/2022 0820   ALT 17 10/29/2022 0820   BILITOT 0.9 10/29/2022 0820       Impression and Plan: Ms. 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   She has recurrent disease.  She has been on different lines of treatment.  We now have her on Xeloda.  We have added Avastin.  Again, the CAT scan will really help [place] out.  We really need to see what the CAT scan shows.  Will try to do a CT of her neck today.  Maybe this will give [place] an idea as to why she is having all this pain.  Again it sounds like and it feels like this is a salivary gland.  Again we will get had to get her on a better pain  regimen.  Will put her on Duragesic.  I will then try her on oxycodone.  For right now, we will give her the Avastin.  I will probably have to get her back to see me in another few weeks.  At that point, then she might be getting ready to go over to [place].  Again she wants to live over there.  This is where her family is from.    Volanda Napoleon, MD 2/28/20248:29 AM   ADDENDUM: Unfortunately, she had a CT of the chest/abdomen/pelvis which showed progressive disease.  This is in her lungs.  There were and had to make a change.  I think it would be reasonable to get her on Lonsurf.  I think this would be reasonable.  We can have to figure out when to get her back in so I can talk to her about this.  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  She is allergic to ampicillin.  I will try her on doxycycline (100 mg p.o. twice daily x 10 days.)  If the antibiotic does not help her, she will have to see her dentist or other ENT for further management.  Again, I told her to stop the Xeloda.  Even though the CEA is a little bit better-15.5-I think the CT scan really shows [place] that she is progressing.  Lattie Haw, MD

## 2022-11-19 NOTE — Patient Instructions (Signed)

## 2022-11-19 NOTE — Patient Instructions (Signed)
Bailey Walker [place]  Discharge Instructions: Thank you for choosing [facility] to provide your oncology and hematology care.   If you have a lab appointment with the [facility], please go directly to the [facility] and check in at the registration area.  Wear comfortable clothing and clothing appropriate for easy access to any Portacath or PICC line.   We strive to give you quality time with your provider. You may need to reschedule your appointment if you arrive late (15 or more minutes).  Arriving late affects you and other patients whose appointments are after yours.  Also, if you miss three or more appointments without notifying the office, you may be dismissed from the clinic at the provider's discretion.      For prescription refill requests, have your pharmacy contact our office and allow 72 hours for refills to be completed.    Today you received the following chemotherapy and/or immunotherapy agents Bevacizumab-xxxx   To help prevent nausea and vomiting after your treatment, we encourage you to take your nausea medication as directed.  BELOW ARE SYMPTOMS THAT SHOULD BE REPORTED IMMEDIATELY: *FEVER GREATER THAN 100.4 F (38 C) OR HIGHER *CHILLS OR SWEATING *NAUSEA AND VOMITING THAT IS NOT CONTROLLED WITH YOUR NAUSEA MEDICATION *UNUSUAL SHORTNESS OF BREATH *UNUSUAL BRUISING OR BLEEDING *URINARY PROBLEMS (pain or burning when urinating, or frequent urination) *BOWEL PROBLEMS (unusual diarrhea, constipation, pain near the anus) TENDERNESS IN MOUTH AND THROAT WITH OR WITHOUT PRESENCE OF ULCERS (sore throat, sores in mouth, or a toothache) UNUSUAL RASH, SWELLING OR PAIN  UNUSUAL VAGINAL DISCHARGE OR ITCHING   Items with * indicate a potential emergency and should be followed up as soon as possible or go to the Emergency Department if any problems should occur.  Please show the CHEMOTHERAPY ALERT CARD or IMMUNOTHERAPY ALERT CARD at  check-in to the Emergency Department and triage nurse. Should you have questions after your visit or need to cancel or reschedule your appointment, please contact [facility]  [phone] and follow the prompts.  Office hours are 8:00 a.m. to 4:30 p.m. Monday - Friday. Please note that voicemails left after 4:00 p.m. may not be returned until the following business day.  We are closed weekends and major holidays. You have access to a nurse at all times for urgent questions. Please call the main number to the clinic [phone] and follow the prompts.  For any non-urgent questions, you may also contact your provider using MyChart. We now offer e-Visits for anyone 39 and older to request care online for non-urgent symptoms. For details visit [email or web address].   Also download the MyChart app! Go to the app store, search "MyChart", open the app, select [facility], and log in with your MyChart username and password.

## 2022-11-19 NOTE — Addendum Note (Signed)
Addended by: Volanda Napoleon on: 11/19/2022 04:58 PM   Modules accepted: Orders

## 2022-11-20 ENCOUNTER — Other Ambulatory Visit (HOSPITAL_COMMUNITY): Payer: Self-pay

## 2022-11-20 ENCOUNTER — Other Ambulatory Visit: Payer: Self-pay

## 2022-11-26 ENCOUNTER — Ambulatory Visit: Payer: Medicaid Other | Admitting: Podiatry

## 2022-11-27 ENCOUNTER — Ambulatory Visit (INDEPENDENT_AMBULATORY_CARE_PROVIDER_SITE_OTHER): Payer: Medicaid Other | Admitting: Podiatry

## 2022-11-27 DIAGNOSIS — M79675 Pain in left toe(s): Secondary | ICD-10-CM

## 2022-11-27 DIAGNOSIS — B351 Tinea unguium: Secondary | ICD-10-CM | POA: Diagnosis not present

## 2022-11-27 DIAGNOSIS — M79674 Pain in right toe(s): Secondary | ICD-10-CM | POA: Diagnosis not present

## 2022-11-27 DIAGNOSIS — B353 Tinea pedis: Secondary | ICD-10-CM

## 2022-11-27 MED ORDER — CLOTRIMAZOLE-BETAMETHASONE 1-0.05 % EX CREA: 1.0000 | TOPICAL_CREAM | Freq: Two times a day (BID) | CUTANEOUS | 0 refills | Status: AC

## 2022-12-01 NOTE — Progress Notes (Signed)
  Subjective:  Patient ID: Bailey Walker, female    DOB: [birth year],  MRN: [id number]  Chief Complaint  Patient presents with   Nail Problem    Thick painful toenails    77 y.o. female presents with the above complaint. History confirmed with patient.  Her nails are thickened elongated causing discomfort, there is also burning in both feet, peeling skin due to her chemotherapy  Objective:  Physical Exam: warm, good capillary refill, no trophic changes or ulcerative lesions, normal DP and PT pulses, abnormal sensory exam, and tinea pedis with erythematous peeling rash. Left Foot: dystrophic yellowed discolored nail plates with subungual debris Right Foot: dystrophic yellowed discolored nail plates with subungual debris   Assessment:   1. Pain due to onychomycosis of toenails of both feet   2. Tinea pedis of both feet      Plan:  Patient was evaluated and treated and all questions answered.  Discussed the etiology and treatment options for the condition in detail with the patient.  Recommended debridement of the nails today. Sharp and mechanical debridement performed of all painful and mycotic nails today. Nails debrided in length and thickness using a nail nipper to level of comfort. Discussed treatment options including appropriate shoe gear. Follow up as needed for painful nails.  Discussed the etiology and treatment options for tinea pedis and dermatitis.  Discussed topical and oral treatment.  Recommended topical treatment with Lotrisone cream.  This was sent to the patient's pharmacy.    Return in about 3 months (around 02/27/2023) for at risk diabetic foot care.

## 2022-12-02 ENCOUNTER — Inpatient Hospital Stay: Payer: Medicaid Other | Admitting: Hematology & Oncology

## 2022-12-02 ENCOUNTER — Encounter: Payer: Self-pay | Admitting: Hematology & Oncology

## 2022-12-02 ENCOUNTER — Other Ambulatory Visit: Payer: Self-pay

## 2022-12-02 ENCOUNTER — Inpatient Hospital Stay: Payer: Medicaid Other

## 2022-12-02 ENCOUNTER — Encounter: Payer: Self-pay | Admitting: Hematology and Oncology

## 2022-12-02 ENCOUNTER — Telehealth: Payer: Self-pay

## 2022-12-02 ENCOUNTER — Other Ambulatory Visit (HOSPITAL_COMMUNITY): Payer: Self-pay

## 2022-12-02 ENCOUNTER — Telehealth: Payer: Self-pay | Admitting: Pharmacist

## 2022-12-02 ENCOUNTER — Inpatient Hospital Stay: Payer: Medicaid Other | Attending: Radiation Oncology

## 2022-12-02 VITALS — BP 159/77 | HR 72 | Temp 97.8°F | Resp 16 | Ht 59.0 in | Wt 153.0 lb

## 2022-12-02 VITALS — BP 166/83 | HR 78

## 2022-12-02 DIAGNOSIS — C772 Secondary and unspecified malignant neoplasm of intra-abdominal lymph nodes: Secondary | ICD-10-CM

## 2022-12-02 DIAGNOSIS — C187 Malignant neoplasm of sigmoid colon: Secondary | ICD-10-CM | POA: Diagnosis not present

## 2022-12-02 DIAGNOSIS — D510 Vitamin B12 deficiency anemia due to intrinsic factor deficiency: Secondary | ICD-10-CM | POA: Insufficient documentation

## 2022-12-02 LAB — CBC WITH DIFFERENTIAL (CANCER CENTER ONLY)
Abs Immature Granulocytes: 0.03 10*3/uL (ref 0.00–0.07)
Basophils Absolute: 0.1 10*3/uL (ref 0.0–0.1)
Basophils Relative: 1 %
Eosinophils Absolute: 0.2 10*3/uL (ref 0.0–0.5)
Eosinophils Relative: 3 %
HCT: 36.6 % (ref 36.0–46.0)
Hemoglobin: 12 g/dL (ref 12.0–15.0)
Immature Granulocytes: 0 %
Lymphocytes Relative: 24 %
Lymphs Abs: 1.7 10*3/uL (ref 0.7–4.0)
MCH: 33.2 pg (ref 26.0–34.0)
MCHC: 32.8 g/dL (ref 30.0–36.0)
MCV: 101.4 fL — ABNORMAL HIGH (ref 80.0–100.0)
Monocytes Absolute: 0.8 10*3/uL (ref 0.1–1.0)
Monocytes Relative: 11 %
Neutro Abs: 4.4 10*3/uL (ref 1.7–7.7)
Neutrophils Relative %: 61 %
Platelet Count: 213 10*3/uL (ref 150–400)
RBC: 3.61 MIL/uL — ABNORMAL LOW (ref 3.87–5.11)
RDW: 13.3 % (ref 11.5–15.5)
WBC Count: 7.3 10*3/uL (ref 4.0–10.5)
nRBC: 0 % (ref 0.0–0.2)

## 2022-12-02 LAB — CMP (CANCER CENTER ONLY)
ALT: 13 U/L (ref 0–44)
AST: 18 U/L (ref 15–41)
Albumin: 3.7 g/dL (ref 3.5–5.0)
Alkaline Phosphatase: 58 U/L (ref 38–126)
Anion gap: 10 (ref 5–15)
BUN: 6 mg/dL — ABNORMAL LOW (ref 8–23)
CO2: 30 mmol/L (ref 22–32)
Calcium: 8.7 mg/dL — ABNORMAL LOW (ref 8.9–10.3)
Chloride: 101 mmol/L (ref 98–111)
Creatinine: 0.57 mg/dL (ref 0.44–1.00)
GFR, Estimated: >60 mL/min (ref >60)
Glucose, Bld: 127 mg/dL — ABNORMAL HIGH (ref 70–99)
Potassium: 2.9 mmol/L — ABNORMAL LOW (ref 3.5–5.1)
Sodium: 141 mmol/L (ref 135–145)
Total Bilirubin: 0.6 mg/dL (ref 0.3–1.2)
Total Protein: 7 g/dL (ref 6.5–8.1)

## 2022-12-02 LAB — CEA (IN HOUSE-CHCC): CEA (CHCC-In House): 15.98 ng/mL — ABNORMAL HIGH (ref 0.00–5.00)

## 2022-12-02 MED ORDER — SODIUM CHLORIDE 0.9% FLUSH
10.0000 mL | INTRAVENOUS | Status: DC | PRN
  Administered 2022-12-02: 10 mL via INTRAVENOUS

## 2022-12-02 MED ORDER — POTASSIUM CHLORIDE 10 MEQ/100ML IV SOLN
10.0000 meq | INTRAVENOUS | Status: AC
  Administered 2022-12-02 (×4): 10 meq via INTRAVENOUS
  Filled 2022-12-02: qty 100

## 2022-12-02 MED ORDER — HEPARIN SOD (PORK) LOCK FLUSH 100 UNIT/ML IV SOLN
500.0000 [IU] | Freq: Once | INTRAVENOUS | Status: AC
  Administered 2022-12-02: 500 [IU] via INTRAVENOUS

## 2022-12-02 MED ORDER — LONSURF 15-6.14 MG PO TABS
30.0000 mg/m2 | ORAL_TABLET | Freq: Two times a day (BID) | ORAL | 4 refills | Status: DC
  Filled 2022-12-02: qty 60, 10d supply, fill #0
  Filled 2022-12-02: qty 60, 28d supply, fill #0
  Filled 2022-12-26: qty 60, 28d supply, fill #1
  Filled 2023-01-23: qty 60, 28d supply, fill #2
  Filled 2023-02-17: qty 60, 28d supply, fill #3
  Filled 2023-03-18: qty 60, 28d supply, fill #4

## 2022-12-02 MED ORDER — SODIUM CHLORIDE 0.9 % IV SOLN: Freq: Once | INTRAVENOUS | Status: AC

## 2022-12-02 NOTE — Telephone Encounter (Signed)
Oral Chemotherapy Pharmacist Encounter    I spoke with patient's niece for overview of: Lonsurf (trifluridine/tipiracil) for the treatment of metastatic colon cancer, planned duration until disease progression or unacceptable drug toxicity.   Counseled on administration, dosing, side effects, monitoring, drug-food interactions, safe handling, storage, and disposal.   Patient will take Lonsurf '15mg'$  (trifluridine component) tablets, 3 tablets ('45mg'$  trifluridine) by mouth twice daily, within 1 hour of finishing AM & PM meals, on days 1-5 and days 8-12, every 28 days.  Lonsurf start date: per MD note, patient to start after she has arrived in [place]. Patient's niece will pick up [place] from the pharmacy 12/03/22.   Adverse effects include but are not limited to: fatigue, nausea, vomiting, diarrhea, and decreased blood counts.   Nausea: Patient has anti-emetic on hand and knows to take it if nausea develops.   Diarrhea: Patient will obtain anti diarrheal and alert the office of 4 or more loose stools above baseline.  Patient to have follow up in [place] per MD.    Reviewed importance of keeping a medication schedule and plan for any missed doses. No barriers to medication adherence identified.   Medication reconciliation performed and medication/allergy list updated.  All questions answered.  [name] voiced understanding and appreciation.    Medication education handout placed in mail for patient and patient's family. Patient's niece knows to call the office with questions or concerns. Oral Chemotherapy Clinic phone number provided.    Leron Croak, PharmD, BCPS, [facility] Hematology/Oncology Clinical Pharmacist [facility] and [place] [phone] 12/02/2022 2:56 PM

## 2022-12-02 NOTE — Telephone Encounter (Signed)
Oral Oncology Pharmacist Encounter  Received new prescription for Lonsurf (trifluridine and tipiracil) for the treatment of metastatic colon cancer, planned duration until disease progression or unacceptable drug toxicity.  CBC w/ Diff and CMP from 12/02/22 assessed, noted K 2.9 mmoL/L - being replaced during OV. Prescription dose and frequency assessed for appropriateness. Confirmed with Dr. Marin Olp that patient is to be on intended dose reduction.  Current medication list in Epic reviewed, no relevant/significant DDIs with Lonsurf identified.  Evaluated chart and no patient barriers to medication adherence noted.   Prescription has been e-scribed to the [facility] for benefits analysis and approval.  Oral Oncology Clinic will continue to follow for insurance authorization, copayment issues, initial counseling and start date.  Leron Croak, PharmD, BCPS, BCOP Hematology/Oncology Clinical Pharmacist [facility] and [place] [phone] 12/02/2022 2:25 PM

## 2022-12-02 NOTE — Progress Notes (Signed)
Hematology and Oncology Follow Up Visit  Bailey Walker [id number] 26-Dec-1945 77 y.o. 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 twice daily (14/7) -start in 05/23/2022 --Avastin added to start on 10/29/2022 --Xeloda DC'd on 11/11/2022 Lonsurf  45 mg po BID x 5 days -- off for 2 days then 45 mg PO BID x 5 days -- off for 2 week   Interim History:  Bailey Walker is here today for follow-up.  It sounds like she will be going over to [place] to live.  She is planning on try to leave this weekend.  She is doing a bit better with the Duragesic patch.  She is not having as much pain over on the right side.  We will make a switch over to [place] now.  Hopefully, it should be able to get the [place] while she is over in [place].  Apparently, there is a relative who is an oncologist over there.  Her last CEA level was actually better at 15.7.  She has had no problems with the colostomy itself.  It is working.  She has had no cough or shortness of breath.  She has had no mouth sores.  There is still little bit of redness and peeling of the feet.  She saw a podiatrist.  He went ahead and gave her some lotion to put on her feet.  Her hands do not look all that bad.  She has had resolution of her sialoadenitis.  Over the last saw her, she had this.  I will put her on anti-inflammatories which seem to help.  She has had no fever.  Overall, I would say her performance status is probably ECOG 1.    Medications:  Allergies as of 12/02/2022       Reactions   Nitroglycerin Nausea  And Vomiting   Ampicillin Other (See Comments)   Per allergy test        Medication List        Accurate as of December 02, 2022 11:39 AM. If you have any questions, ask your nurse or doctor.          alendronate 70 MG tablet Commonly known as: FOSAMAX Take 70 mg by mouth every Sunday.   aspirin EC 81 MG tablet Take 81 mg by mouth daily. Swallow whole.   atorvastatin 40 MG tablet Commonly known as: LIPITOR Take by mouth.   bisacodyl 5 MG EC tablet Commonly known as: DULCOLAX Take by mouth.   clotrimazole-betamethasone cream Commonly known as: Lotrisone Apply 1 Application topically 2 (two) times daily.   diclofenac Sodium 1 % Gel Commonly known as: VOLTAREN Apply 4 g topically 4 (four) times daily.   doxycycline 100 MG tablet Commonly known as: VIBRA-TABS Take 1 tablet (100 mg total) by mouth 2 (two) times daily.   feeding supplement (GLUCERNA SHAKE) Liqd Take 237 mLs by mouth 2 (two) times daily between meals.   fentaNYL 25 MCG/HR Commonly known as: [place] 1 patch onto the skin every 3 (three) days.   glipiZIDE 5  MG tablet Commonly known as: GLUCOTROL TAKE 1 TABLET BY MOUTH TWICE A DAY BEFORE MEALS   levothyroxine 75 MCG tablet Commonly known as: SYNTHROID TAKE 1 TABLET BY MOUTH EVERY DAY   lidocaine-prilocaine cream Commonly known as: EMLA Apply 1 Application topically as needed.   lidocaine-prilocaine cream Commonly known as: EMLA Apply to affected area once   Lonsurf 15-6.14 MG tablet Generic drug: trifluridine-tipiracil Take 3 tablets (45 mg of trifluridine total) by mouth 2 (two) times daily after a meal. Take within 1 hr after AM & PM meals on days 1-5, 8-12. Repeat every 28 days. Started by: Volanda Napoleon, MD   loperamide 2 MG capsule Commonly known as: IMODIUM TAKE 2 CAPSULES (4 MG TOTAL) BY MOUTH AS NEEDED. TAKE 2 AT DIARRHEA ONSET , THEN 1 EVERY 2HR UNTIL 12HRS WITH NO BM. MAY TAKE 2 EVERY 4HRS AT NIGHT. IF DIARRHEA RECURS  REPEAT.   metFORMIN 500 MG tablet Commonly known as: GLUCOPHAGE Take by mouth.   methocarbamol 500 MG tablet Commonly known as: ROBAXIN TAKE 1 TABLET BY MOUTH EVERY 12 HOURS AS NEEDED FOR MUSCLE SPASMS.   metoprolol tartrate 25 MG tablet Commonly known as: LOPRESSOR Take 1 tablet (25 mg total) by mouth 2 (two) times daily.   multivitamin with minerals Tabs tablet Take 1 tablet by mouth daily.   mupirocin ointment 2 % Commonly known as: BACTROBAN Apply topically 3 (three) times daily.   ondansetron 8 MG tablet Commonly known as: ZOFRAN Take 1 tablet (8 mg total) by mouth every 8 (eight) hours as needed for vomiting or nausea. Start on day 3 after chemotherapy.   oxyCODONE 5 MG immediate release tablet Commonly known as: Oxy IR/ROXICODONE Take 1 tablet (5 mg total) by mouth every 6 (six) hours as needed for severe pain. She take 1-2 pills every 6 hours as needed for pain.   pantoprazole 40 MG tablet Commonly known as: PROTONIX Take 1 tablet (40 mg total) by mouth daily for 14 days.   polyethylene glycol powder 17 GM/SCOOP powder Commonly known as: GLYCOLAX/MIRALAX Take by mouth.   potassium chloride SA 20 MEQ tablet Commonly known as: Klor-Con M20 Take 1 tablet (20 mEq total) by mouth daily.   prednisoLONE acetate 1 % ophthalmic suspension Commonly known as: PRED FORTE Place 1 drop into both eyes every Monday.   prochlorperazine 10 MG tablet Commonly known as: COMPAZINE Take 1 tablet (10 mg total) by mouth every 6 (six) hours as needed (NAUSEA).   saccharomyces boulardii 250 MG capsule Commonly known as: FLORASTOR Take 1 capsule (250 mg total) by mouth 2 (two) times daily.   silver sulfADIAZINE 1 % cream Commonly known as: Silvadene Apply 1 application topically daily.   Vitamin D (Ergocalciferol) 1.25 MG (50000 UNIT) Caps capsule Commonly known as: DRISDOL Take by mouth.        Allergies:  Allergies  Allergen Reactions   Nitroglycerin Nausea And  Vomiting   Ampicillin Other (See Comments)    Per allergy test     Past Medical History, Surgical history, Social history, and Family History were reviewed and updated.  Review of Systems: Review of Systems  HENT: Negative.    Eyes: Negative.   Respiratory:  Positive for shortness of breath.   Cardiovascular: Negative.   Gastrointestinal:  Positive for abdominal pain.  Genitourinary: Negative.   Musculoskeletal:  Positive for back pain.  Skin: Negative.   Neurological: Negative.   Endo/Heme/Allergies: Negative.   Psychiatric/Behavioral: Negative.       Physical  Exam:  height is '4\' 11"'$  (1.499 m) and weight is 153 lb (69.4 kg). Her oral temperature is 97.8 F (36.6 C). Her blood pressure is 159/77 (abnormal) and her pulse is 72. Her respiration is 16 and oxygen saturation is 98%.   Wt Readings from Last 3 Encounters:  12/02/22 153 lb (69.4 kg)  11/19/22 155 lb 6.4 oz (70.5 kg)  10/29/22 154 lb (69.9 kg)    Physical Exam Vitals reviewed.  HENT:     Head: Normocephalic and atraumatic.  Eyes:     Pupils: Pupils are equal, round, and reactive to light.  Neck:     Comments: I do not detect any adenopathy in the neck bilaterally. Cardiovascular:     Rate and Rhythm: Normal rate and regular rhythm.     Heart sounds: Normal heart sounds.  Pulmonary:     Effort: Pulmonary effort is normal.     Breath sounds: Normal breath sounds.  Abdominal:     General: Bowel sounds are normal.     Palpations: Abdomen is soft.     Comments: Her abdomen is slightly distended.  There is no obvious fluid wave.  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  She still has a dressing over the abdominal laparotomy scar.  She does not have any obvious erythema or warmth.  There is no exudate coming from the scars.  She has decent bowel sounds.  There is no palpable liver or spleen tip.  Musculoskeletal:        General: No tenderness or deformity. Normal range of motion.     Cervical back: Normal  range of motion.  Lymphadenopathy:     Cervical: No cervical adenopathy.  Skin:    General: Skin is warm and dry.     Findings: No erythema or rash.     Comments: She has  mild  erythema on the hands and feet.  There is some skin peeling on the feet.  Neurological:     Mental Status: She is alert and oriented to person, place, and time.  Psychiatric:        Behavior: Behavior normal.        Thought Content: Thought content normal.        Judgment: Judgment normal.      Lab Results  Component Value Date   WBC 7.3 12/02/2022   HGB 12.0 12/02/2022   HCT 36.6 12/02/2022   MCV 101.4 (H) 12/02/2022   PLT 213 12/02/2022   Lab Results  Component Value Date   FERRITIN 267 10/10/2022   IRON 146 10/10/2022   TIBC 328 10/10/2022   UIBC 182 10/10/2022   IRONPCTSAT 45 (H) 10/10/2022   Lab Results  Component Value Date   RETICCTPCT 2.6 08/07/2022   RBC 3.61 (L) 12/02/2022   No results found for: "KPAFRELGTCHN", "LAMBDASER", "KAPLAMBRATIO" No results found for: "IGGSERUM", "IGA", "IGMSERUM" No results found for: "TOTALPROTELP", "ALBUMINELP", "A1GS", "A2GS", "BETS", "BETA2SER", "GAMS", "MSPIKE", "SPEI"   Chemistry      Component Value Date/Time   NA 141 12/02/2022 0910   NA 139 07/12/2019 1110   K 2.9 (L) 12/02/2022 0910   CL 101 12/02/2022 0910   CO2 30 12/02/2022 0910   BUN 6 (L) 12/02/2022 0910   BUN 15 07/12/2019 1110   CREATININE 0.57 12/02/2022 0910   CREATININE 0.74 01/08/2017 1100      Component Value Date/Time   CALCIUM 8.7 (L) 12/02/2022 0910   ALKPHOS 58 12/02/2022 0910   AST 18 12/02/2022  0910   ALT 13 12/02/2022 0910   BILITOT 0.6 12/02/2022 0910       Impression and Plan: Bailey Walker is a very pleasant 77 yo Kurdish female with stage IIIB [id number]) 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   She has recurrent disease.  She has been on different lines of treatment.  We now have her on Xeloda.  We have added Avastin.  We will  now try her on Lonsurf.  I think this would be reasonable for her.  Hopefully, she will be able to get this over in [place].  I told her to start the [place] after she gets over to [place].  We she will be able to get it to her here before she leaves.  I just hope that all goes well for her.  I know that she has an all that we have asked her to do.  She is done incredibly well.  Hopefully the Duragesic and oxycodone will help with her pain.  It has been a true joy to have been able to help Bailey Walker.  She is an incredibly tough lady.  It sounds like she will be back to visit every now and then.  Hopefully we will be able to see her.    Volanda Napoleon, MD 3/12/202411:39 AM

## 2022-12-02 NOTE — Patient Instructions (Signed)
Potassium Chloride Injection What is this medication? POTASSIUM CHLORIDE (poe TASS i um KLOOR ide) prevents and treats low levels of potassium in your body. Potassium plays an important role in maintaining the health of your kidneys, heart, muscles, and nervous system. This medicine may be used for other purposes; ask your health care provider or pharmacist if you have questions. COMMON BRAND NAME(S): PROAMP What should I tell my care team before I take this medication? They need to know if you have any of these conditions: Addison disease Dehydration Diabetes (high blood sugar) Heart disease High levels of potassium in the blood Irregular heartbeat or rhythm Kidney disease Large areas of burned skin An unusual or allergic reaction to potassium, other medications, foods, dyes, or preservatives Pregnant or trying to get pregnant Breast-feeding How should I use this medication? This medication is injected into a vein. It is given in a hospital or clinic setting. Talk to your care team about the use of this medication in children. Special care may be needed. Overdosage: If you think you have taken too much of this medicine contact a poison control center or emergency room at once. NOTE: This medicine is only for you. Do not share this medicine with others. What if I miss a dose? This does not apply. This medication is not for regular use. What may interact with this medication? Do not take this medication with any of the following: Certain diuretics, such as spironolactone, triamterene Eplerenone Sodium polystyrene sulfonate This medication may also interact with the following: Certain medications for blood pressure or heart disease, such as lisinopril, losartan, quinapril, valsartan Medications that lower your chance of fighting infection, such as cyclosporine, tacrolimus NSAIDs, medications for pain and inflammation, such as ibuprofen or naproxen Other potassium supplements Salt  substitutes This list may not describe all possible interactions. Give your health care provider a list of all the medicines, herbs, non-prescription drugs, or dietary supplements you use. Also tell them if you smoke, drink alcohol, or use illegal drugs. Some items may interact with your medicine. What should I watch for while using this medication? Visit your care team for regular checks on your progress. Tell your care team if your symptoms do not start to get better or if they get worse. You may need blood work while you are taking this medication. Avoid salt substitutes unless you are told otherwise by your care team. What side effects may I notice from receiving this medication? Side effects that you should report to your care team as soon as possible: Allergic reactions--skin rash, itching, hives, swelling of the face, lips, tongue, or throat High potassium level--muscle weakness, fast or irregular heartbeat Side effects that usually do not require medical attention (report to your care team if they continue or are bothersome): Diarrhea Nausea Stomach pain Vomiting This list may not describe all possible side effects. Call your doctor for medical advice about side effects. You may report side effects to FDA at 1-800-FDA-1088. Where should I keep my medication? This medication is given in a hospital or clinic. It will not be stored at home. NOTE: This sheet is a summary. It may not cover all possible information. If you have questions about this medicine, talk to your doctor, pharmacist, or health care provider.  2023 Elsevier/Gold Standard (2007-10-30 00:00:00)

## 2022-12-02 NOTE — Telephone Encounter (Signed)
Patient was successfully OnBoarded and scheduled to pick up their Lonsurf at [facility] on 12/03/22.    Berdine Addison, [facility] Patient [place]  [phone] (phone) [phone] (fax) 12/02/2022 2:54 PM

## 2022-12-02 NOTE — Telephone Encounter (Signed)
Oral Oncology Patient Advocate Encounter  After completing a benefits investigation, prior authorization for Lonsurf is not required at this time through [facility] ([facility]).  Patient's copay is $4.00.     Bailey Walker, [facility] Patient [place]  [phone] (phone) [phone] (fax) 12/02/2022 10:55 AM

## 2022-12-03 ENCOUNTER — Other Ambulatory Visit (HOSPITAL_COMMUNITY): Payer: Self-pay

## 2022-12-10 ENCOUNTER — Ambulatory Visit: Payer: Medicaid Other | Admitting: Hematology & Oncology

## 2022-12-10 ENCOUNTER — Ambulatory Visit: Payer: Medicaid Other

## 2022-12-10 ENCOUNTER — Other Ambulatory Visit: Payer: Medicaid Other

## 2022-12-10 ENCOUNTER — Inpatient Hospital Stay: Payer: Medicaid Other

## 2022-12-11 ENCOUNTER — Other Ambulatory Visit: Payer: Self-pay | Admitting: Hematology & Oncology

## 2022-12-11 ENCOUNTER — Other Ambulatory Visit: Payer: Self-pay | Admitting: Hematology and Oncology

## 2022-12-19 ENCOUNTER — Other Ambulatory Visit: Payer: Self-pay

## 2022-12-24 ENCOUNTER — Other Ambulatory Visit: Payer: Self-pay

## 2022-12-26 ENCOUNTER — Other Ambulatory Visit: Payer: Self-pay

## 2022-12-26 ENCOUNTER — Other Ambulatory Visit (HOSPITAL_COMMUNITY): Payer: Self-pay

## 2022-12-29 ENCOUNTER — Other Ambulatory Visit (HOSPITAL_COMMUNITY): Payer: Self-pay

## 2022-12-29 ENCOUNTER — Other Ambulatory Visit: Payer: Self-pay

## 2023-01-15 ENCOUNTER — Other Ambulatory Visit (HOSPITAL_COMMUNITY): Payer: Self-pay

## 2023-01-15 ENCOUNTER — Encounter: Payer: Self-pay | Admitting: Hematology and Oncology

## 2023-01-16 ENCOUNTER — Telehealth: Payer: Self-pay | Admitting: *Deleted

## 2023-01-16 NOTE — Telephone Encounter (Signed)
Call received from pt.'s niece Dois Davenport stating that pt is currently in [place] and would like to know if Dr. Myna Hidalgo can send in a prescription for pain medicine for pt to be mailed over to [place].  Dois Davenport informed that Dr Myna Hidalgo can legally not sent in a prescription for a narcotic to be mailed to another country.  Dois Davenport states that pt has an appt with a MD in [place] tomorrow and will f/u with that MD for pain medication.

## 2023-01-17 ENCOUNTER — Encounter: Payer: Self-pay | Admitting: Hematology & Oncology

## 2023-01-19 ENCOUNTER — Encounter: Payer: Self-pay | Admitting: Hematology and Oncology

## 2023-01-19 ENCOUNTER — Other Ambulatory Visit: Payer: Self-pay | Admitting: Hematology & Oncology

## 2023-01-20 ENCOUNTER — Telehealth: Payer: Self-pay

## 2023-01-20 ENCOUNTER — Other Ambulatory Visit (HOSPITAL_COMMUNITY): Payer: Self-pay

## 2023-01-20 NOTE — Telephone Encounter (Signed)
Patients niece Bailey Walker called asking for op note from patients colectomy, patient is overseas and the dr. Laury Axon to know what all was removed. Op note sent via mychart per request.

## 2023-01-23 ENCOUNTER — Other Ambulatory Visit (HOSPITAL_COMMUNITY): Payer: Self-pay

## 2023-01-26 ENCOUNTER — Other Ambulatory Visit (HOSPITAL_COMMUNITY): Payer: Self-pay

## 2023-02-06 ENCOUNTER — Telehealth: Payer: Self-pay | Admitting: *Deleted

## 2023-02-06 NOTE — Telephone Encounter (Signed)
Call received from patient's niece Dois Davenport stating,"my aunt is currently in [place] and needs her pain medication refilled and mailed to [place]. I informed Dois Davenport that Dr. Myna Hidalgo can not legally send a narcotic prescription to another country. She will need to see a doctor in [place] to get pain medication.

## 2023-02-17 ENCOUNTER — Other Ambulatory Visit: Payer: Self-pay

## 2023-02-17 ENCOUNTER — Other Ambulatory Visit (HOSPITAL_COMMUNITY): Payer: Self-pay

## 2023-02-18 ENCOUNTER — Other Ambulatory Visit (HOSPITAL_COMMUNITY): Payer: Self-pay

## 2023-02-25 ENCOUNTER — Other Ambulatory Visit: Payer: Self-pay

## 2023-02-25 MED ORDER — FENTANYL 25 MCG/HR TD PT72: 1.0000 | MEDICATED_PATCH | TRANSDERMAL | 0 refills | Status: AC

## 2023-02-26 ENCOUNTER — Encounter: Payer: Self-pay | Admitting: *Deleted

## 2023-02-26 NOTE — Progress Notes (Signed)
Received approval for Fentanyl 59mcg/72 hr patch from [facility] and is effective until 05/27/23

## 2023-03-03 ENCOUNTER — Ambulatory Visit: Payer: Medicaid Other | Admitting: Podiatry

## 2023-03-03 ENCOUNTER — Telehealth: Payer: Self-pay

## 2023-03-03 NOTE — Telephone Encounter (Signed)
Called [facility] and cancelled fentanyl rx, patient in [place] and niece informed that since she is in [place] we are unable to send this in that she will have to see a provider in [place].

## 2023-03-16 ENCOUNTER — Other Ambulatory Visit: Payer: Self-pay

## 2023-03-17 ENCOUNTER — Telehealth: Payer: Self-pay

## 2023-03-17 NOTE — Telephone Encounter (Signed)
Niece Dois Daven[place]) called in wanting to speak with Dr Myna Hidalgo regarding pt's current condition and seek advise. She states [place] is still [place] and they are debating bringing her back to the [place] due to pt wanting to see Dr Myna Hidalgo and her uncontrolled pain. She states pt is crying every day due to pain. Dois Davenport said the medication is different in [place] and it is not managing the pain. 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 Niece would also like to know if Dr Myna Hidalgo thinks she should still be taking the [place] 3 tabs BID.  Spoke with Dr Myna Hidalgo that did state that pt should be taking the [facility] as prescribed. Dr Myna Hidalgo said he would be happy to see pt if she is in the country. The decision to come back to the [place] is up to the family and patient. He is sorry to hear her pain is not managed but is not surprised to hear her status with the diagnosis. Niece advised and states she will talk more with pt and family about the decision to return to the [place].

## 2023-03-18 ENCOUNTER — Other Ambulatory Visit (HOSPITAL_COMMUNITY): Payer: Self-pay

## 2023-03-19 ENCOUNTER — Other Ambulatory Visit: Payer: Self-pay

## 2023-03-27 ENCOUNTER — Other Ambulatory Visit (HOSPITAL_COMMUNITY): Payer: Self-pay

## 2023-04-14 ENCOUNTER — Other Ambulatory Visit (HOSPITAL_COMMUNITY): Payer: Self-pay

## 2023-04-16 ENCOUNTER — Other Ambulatory Visit: Payer: Self-pay | Admitting: Hematology & Oncology

## 2023-04-16 ENCOUNTER — Other Ambulatory Visit: Payer: Self-pay

## 2023-04-16 ENCOUNTER — Other Ambulatory Visit (HOSPITAL_COMMUNITY): Payer: Self-pay

## 2023-04-16 MED ORDER — LONSURF 15-6.14 MG PO TABS
30.0000 mg/m2 | ORAL_TABLET | Freq: Two times a day (BID) | ORAL | 4 refills | Status: AC
  Filled 2023-04-16: qty 60, 28d supply, fill #0
  Filled 2023-05-13: qty 60, 28d supply, fill #1
  Filled 2023-06-09: qty 60, 28d supply, fill #2
  Filled 2023-07-29: qty 60, 28d supply, fill #3

## 2023-04-21 ENCOUNTER — Other Ambulatory Visit: Payer: Self-pay

## 2023-05-07 ENCOUNTER — Encounter: Payer: Self-pay | Admitting: Hematology and Oncology

## 2023-05-13 ENCOUNTER — Other Ambulatory Visit (HOSPITAL_COMMUNITY): Payer: Self-pay

## 2023-05-15 ENCOUNTER — Other Ambulatory Visit (HOSPITAL_COMMUNITY): Payer: Self-pay

## 2023-06-08 ENCOUNTER — Other Ambulatory Visit: Payer: Self-pay

## 2023-06-09 ENCOUNTER — Other Ambulatory Visit (HOSPITAL_COMMUNITY): Payer: Self-pay

## 2023-07-06 ENCOUNTER — Other Ambulatory Visit: Payer: Self-pay

## 2023-07-06 ENCOUNTER — Other Ambulatory Visit (HOSPITAL_COMMUNITY): Payer: Self-pay

## 2023-07-08 ENCOUNTER — Telehealth: Payer: Self-pay | Admitting: *Deleted

## 2023-07-08 NOTE — Telephone Encounter (Signed)
Received a voicemail from [place], patients niece stating that her aunt was told there were no more options in their country and that her cancer was wide spread.  Patient asked if they came back what would Dr Myna Hidalgo be able to do for her.  Spoke with dr Myna Hidalgo and he stated that it all depends on what she has gotten over there.  Called sandra back and left VM.  Attempted again  without success.

## 2023-07-17 ENCOUNTER — Other Ambulatory Visit: Payer: Self-pay

## 2023-07-27 ENCOUNTER — Other Ambulatory Visit: Payer: Self-pay

## 2023-07-29 ENCOUNTER — Other Ambulatory Visit: Payer: Self-pay

## 2023-07-29 NOTE — Progress Notes (Signed)
Specialty Pharmacy Refill Coordination Note  Bailey Walker is a 77 y.o. female contacted today regarding refills of specialty medication(s) Trifluridine-Tipiracil   Patient requested Delivery   Delivery date: 07/31/23   Verified address: [address], [place], [address]   Medication will be filled on 07/30/23.

## 2023-07-30 ENCOUNTER — Other Ambulatory Visit: Payer: Self-pay

## 2023-07-30 ENCOUNTER — Other Ambulatory Visit (HOSPITAL_COMMUNITY): Payer: Self-pay

## 2023-08-21 ENCOUNTER — Other Ambulatory Visit: Payer: Self-pay

## 2023-08-24 ENCOUNTER — Other Ambulatory Visit: Payer: Self-pay

## 2023-10-06 ENCOUNTER — Telehealth: Payer: Self-pay

## 2023-10-06 NOTE — Telephone Encounter (Signed)
 Patients daughter called asking if we had any pathology or info on past treatment, the Dr. In [place] is requesting these for her tx. Printed out foundation one from 2022 and last office note, patients daughter will pick up from office.

## 2023-11-12 ENCOUNTER — Other Ambulatory Visit: Payer: Self-pay

## 2023-11-12 ENCOUNTER — Other Ambulatory Visit (HOSPITAL_COMMUNITY): Payer: Self-pay

## 2023-11-16 ENCOUNTER — Encounter (HOSPITAL_COMMUNITY): Payer: Self-pay

## 2023-11-16 ENCOUNTER — Other Ambulatory Visit (HOSPITAL_COMMUNITY): Payer: Self-pay

## 2023-11-18 ENCOUNTER — Other Ambulatory Visit: Payer: Self-pay

## 2023-11-23 ENCOUNTER — Other Ambulatory Visit: Payer: Self-pay

## 2023-11-23 ENCOUNTER — Other Ambulatory Visit (HOSPITAL_COMMUNITY): Payer: Self-pay

## 2023-11-24 ENCOUNTER — Other Ambulatory Visit (HOSPITAL_COMMUNITY): Payer: Self-pay

## 2023-11-24 NOTE — Progress Notes (Signed)
Disenrolled patient from Transport planner.
